# Patient Record
Sex: Female | Born: 1945
Health system: Southern US, Community
[De-identification: ages and names within clinical notes are randomized; demographics above are authoritative.]

## PROBLEM LIST (undated history)

## (undated) DIAGNOSIS — G8929 Other chronic pain: Secondary | ICD-10-CM

## (undated) DIAGNOSIS — F419 Anxiety disorder, unspecified: Secondary | ICD-10-CM

## (undated) DIAGNOSIS — R29898 Other symptoms and signs involving the musculoskeletal system: Secondary | ICD-10-CM

## (undated) DIAGNOSIS — M1990 Unspecified osteoarthritis, unspecified site: Secondary | ICD-10-CM

## (undated) DIAGNOSIS — K219 Gastro-esophageal reflux disease without esophagitis: Secondary | ICD-10-CM

## (undated) DIAGNOSIS — E0590 Thyrotoxicosis, unspecified without thyrotoxic crisis or storm: Secondary | ICD-10-CM

## (undated) DIAGNOSIS — J449 Chronic obstructive pulmonary disease, unspecified: Secondary | ICD-10-CM

## (undated) DIAGNOSIS — M2550 Pain in unspecified joint: Secondary | ICD-10-CM

## (undated) DIAGNOSIS — R112 Nausea with vomiting, unspecified: Secondary | ICD-10-CM

## (undated) DIAGNOSIS — E785 Hyperlipidemia, unspecified: Secondary | ICD-10-CM

## (undated) DIAGNOSIS — H269 Unspecified cataract: Secondary | ICD-10-CM

## (undated) DIAGNOSIS — Z860100 Personal history of colon polyps, unspecified: Secondary | ICD-10-CM

## (undated) DIAGNOSIS — Z9889 Other specified postprocedural states: Secondary | ICD-10-CM

## (undated) DIAGNOSIS — L570 Actinic keratosis: Secondary | ICD-10-CM

## (undated) DIAGNOSIS — M2540 Effusion, unspecified joint: Secondary | ICD-10-CM

## (undated) DIAGNOSIS — G4700 Insomnia, unspecified: Secondary | ICD-10-CM

## (undated) DIAGNOSIS — I10 Essential (primary) hypertension: Secondary | ICD-10-CM

## (undated) DIAGNOSIS — J189 Pneumonia, unspecified organism: Secondary | ICD-10-CM

## (undated) DIAGNOSIS — M549 Dorsalgia, unspecified: Secondary | ICD-10-CM

## (undated) HISTORY — PX: TUBAL LIGATION: SHX77

## (undated) HISTORY — DX: Anxiety disorder, unspecified: F41.9

## (undated) HISTORY — PX: COLONOSCOPY: SHX174

## (undated) HISTORY — PX: ABDOMINAL HYSTERECTOMY: SHX81

## (undated) HISTORY — PX: BACK SURGERY: SHX140

## (undated) HISTORY — PX: THORACIC DISC SURGERY: SHX801

## (undated) HISTORY — PX: EYE SURGERY: SHX253

## (undated) HISTORY — PX: TONSILLECTOMY: SUR1361

## (undated) HISTORY — DX: Actinic keratosis: L57.0

## (undated) HISTORY — DX: Other symptoms and signs involving the musculoskeletal system: R29.898

## (undated) HISTORY — PX: VAGINA SURGERY: SHX829

## (undated) HISTORY — PX: JOINT REPLACEMENT: SHX530

---

## 2004-06-02 ENCOUNTER — Ambulatory Visit: Payer: Self-pay | Admitting: Family Medicine

## 2008-02-02 ENCOUNTER — Ambulatory Visit: Payer: Self-pay | Admitting: Family Medicine

## 2008-09-17 LAB — HM COLONOSCOPY: HM Colonoscopy: NORMAL

## 2009-04-06 ENCOUNTER — Emergency Department: Payer: Self-pay | Admitting: Emergency Medicine

## 2009-05-08 ENCOUNTER — Ambulatory Visit: Payer: Self-pay | Admitting: Unknown Physician Specialty

## 2009-05-16 ENCOUNTER — Ambulatory Visit: Payer: Self-pay | Admitting: Unknown Physician Specialty

## 2009-05-23 ENCOUNTER — Ambulatory Visit: Payer: Self-pay | Admitting: Gastroenterology

## 2009-07-06 HISTORY — PX: FOOT SURGERY: SHX648

## 2009-07-06 HISTORY — PX: MELANOMA EXCISION: SHX5266

## 2009-07-06 HISTORY — PX: ESOPHAGOGASTRODUODENOSCOPY: SHX1529

## 2010-04-28 ENCOUNTER — Ambulatory Visit: Payer: Self-pay | Admitting: Family Medicine

## 2010-05-19 ENCOUNTER — Ambulatory Visit: Payer: Self-pay | Admitting: Gastroenterology

## 2010-05-21 LAB — PATHOLOGY REPORT

## 2011-04-06 LAB — HM DIABETES EYE EXAM

## 2011-05-21 ENCOUNTER — Ambulatory Visit: Payer: Self-pay | Admitting: Family Medicine

## 2011-08-20 ENCOUNTER — Ambulatory Visit: Payer: Self-pay | Admitting: Orthopedic Surgery

## 2011-09-08 ENCOUNTER — Encounter (HOSPITAL_COMMUNITY)
Admission: RE | Admit: 2011-09-08 | Discharge: 2011-09-08 | Disposition: A | Discharge status: Home / Self Care | Payer: PRIVATE HEALTH INSURANCE | Source: Ambulatory Visit | Attending: Neurosurgery | Admitting: Neurosurgery

## 2011-09-08 ENCOUNTER — Encounter (HOSPITAL_COMMUNITY): Payer: Self-pay | Admitting: Pharmacy Technician

## 2011-09-08 ENCOUNTER — Other Ambulatory Visit: Payer: Self-pay | Admitting: Neurosurgery

## 2011-09-08 ENCOUNTER — Other Ambulatory Visit: Payer: Self-pay

## 2011-09-08 ENCOUNTER — Encounter (HOSPITAL_COMMUNITY): Payer: Self-pay

## 2011-09-08 ENCOUNTER — Ambulatory Visit (HOSPITAL_COMMUNITY)
Admission: RE | Admit: 2011-09-08 | Discharge: 2011-09-08 | Disposition: A | Discharge status: Home / Self Care | Payer: PRIVATE HEALTH INSURANCE | Source: Ambulatory Visit | Attending: Anesthesiology | Admitting: Anesthesiology

## 2011-09-08 DIAGNOSIS — R0602 Shortness of breath: Secondary | ICD-10-CM | POA: Insufficient documentation

## 2011-09-08 DIAGNOSIS — Z01812 Encounter for preprocedural laboratory examination: Secondary | ICD-10-CM | POA: Insufficient documentation

## 2011-09-08 DIAGNOSIS — Z01818 Encounter for other preprocedural examination: Secondary | ICD-10-CM | POA: Insufficient documentation

## 2011-09-08 DIAGNOSIS — Z01810 Encounter for preprocedural cardiovascular examination: Secondary | ICD-10-CM | POA: Insufficient documentation

## 2011-09-08 HISTORY — DX: Other chronic pain: G89.29

## 2011-09-08 HISTORY — DX: Personal history of colon polyps, unspecified: Z86.0100

## 2011-09-08 HISTORY — DX: Essential (primary) hypertension: I10

## 2011-09-08 HISTORY — DX: Unspecified cataract: H26.9

## 2011-09-08 HISTORY — DX: Personal history of colonic polyps: Z86.010

## 2011-09-08 HISTORY — DX: Hyperlipidemia, unspecified: E78.5

## 2011-09-08 HISTORY — DX: Unspecified osteoarthritis, unspecified site: M19.90

## 2011-09-08 HISTORY — DX: Pneumonia, unspecified organism: J18.9

## 2011-09-08 HISTORY — DX: Pain in unspecified joint: M25.50

## 2011-09-08 HISTORY — DX: Other chronic pain: M54.9

## 2011-09-08 HISTORY — DX: Nausea with vomiting, unspecified: R11.2

## 2011-09-08 HISTORY — DX: Other specified postprocedural states: Z98.890

## 2011-09-08 HISTORY — DX: Effusion, unspecified joint: M25.40

## 2011-09-08 HISTORY — DX: Gastro-esophageal reflux disease without esophagitis: K21.9

## 2011-09-08 LAB — BASIC METABOLIC PANEL
BUN: 9 mg/dL (ref 6–23)
CO2: 34 mEq/L — ABNORMAL HIGH (ref 19–32)
Calcium: 10 mg/dL (ref 8.4–10.5)
Chloride: 96 mEq/L (ref 96–112)
Creatinine, Ser: 0.76 mg/dL (ref 0.50–1.10)
GFR calc Af Amer: >90 mL/min (ref >90)
GFR calc non Af Amer: 88 mL/min — ABNORMAL LOW (ref >90)
Glucose, Bld: 147 mg/dL — ABNORMAL HIGH (ref 70–99)
Potassium: 3 mEq/L — ABNORMAL LOW (ref 3.5–5.1)
Sodium: 139 mEq/L (ref 135–145)

## 2011-09-08 LAB — CBC
HCT: 39.8 % (ref 36.0–46.0)
Hemoglobin: 14.1 g/dL (ref 12.0–15.0)
MCH: 31.3 pg (ref 26.0–34.0)
MCHC: 35.4 g/dL (ref 30.0–36.0)
MCV: 88.2 fL (ref 78.0–100.0)
Platelets: 301 10*3/uL (ref 150–400)
RBC: 4.51 MIL/uL (ref 3.87–5.11)
RDW: 12.6 % (ref 11.5–15.5)
WBC: 11 10*3/uL — ABNORMAL HIGH (ref 4.0–10.5)

## 2011-09-08 LAB — SURGICAL PCR SCREEN
MRSA, PCR: NEGATIVE
Staphylococcus aureus: POSITIVE — AB

## 2011-09-08 NOTE — Pre-Procedure Instructions (Signed)
22 Maria Hinton  09/08/2011   Your procedure is scheduled on:  Thurs, Mar 7 @ 1210 pm  Report to [facility] at 0900 AM.  Call this number if you have problems the morning of surgery: [phone]   Remember:   Do not eat food:After Midnight.  May have clear liquids: up to 4 Hours before arrival.(until 5:00 am)  Clear liquids include soda, tea, black coffee, apple or grape juice, broth.  Take these medicines the morning of surgery with A SIP OF WATER:    Do not wear jewelry, make-up or nail polish.  Do not wear lotions, powders, or perfumes. You may wear deodorant.  Do not shave 48 hours prior to surgery.  Do not bring valuables to the hospital.  Contacts, dentures or bridgework may not be worn into surgery.  Leave suitcase in the car. After surgery it may be brought to your room.  For patients admitted to the hospital, checkout time is 11:00 AM the day of discharge.   Patients discharged the day of surgery will not be allowed to drive home.  Name and phone number of your driver:   Special Instructions: CHG Shower Use Special Wash: 1/2 bottle night before surgery and 1/2 bottle morning of surgery.   Please read over the following fact sheets that you were given: Pain Booklet, Coughing and Deep Breathing, MRSA Information and Surgical Site Infection Prevention

## 2011-09-08 NOTE — Progress Notes (Signed)
Pt doesn't have a cardiologist;medical MD is Dr.Katherine Bliss who manages HTN/[phone])  Heart cath in 1965 and then last one 24yrs ago  Stress test done 15+yrs ago and the last one about 41yrs ago  Echo done about 46yrs ago

## 2011-09-09 MED ORDER — CEFAZOLIN SODIUM 1-5 GM-% IV SOLN
1.0000 g | INTRAVENOUS | Status: AC
  Administered 2011-09-10: 1 g via INTRAVENOUS
  Filled 2011-09-09: qty 50

## 2011-09-10 ENCOUNTER — Encounter (HOSPITAL_COMMUNITY)
Admission: RE | Disposition: A | Discharge status: Home / Self Care | Payer: Self-pay | Source: Ambulatory Visit | Attending: Neurosurgery

## 2011-09-10 ENCOUNTER — Encounter (HOSPITAL_COMMUNITY): Payer: Self-pay | Admitting: Surgery

## 2011-09-10 ENCOUNTER — Ambulatory Visit (HOSPITAL_COMMUNITY)
Admission: RE | Admit: 2011-09-10 | Discharge: 2011-09-11 | Disposition: A | Discharge status: Home / Self Care | Payer: PRIVATE HEALTH INSURANCE | Source: Ambulatory Visit | Attending: Neurosurgery | Admitting: Neurosurgery

## 2011-09-10 ENCOUNTER — Encounter (HOSPITAL_COMMUNITY): Payer: Self-pay | Admitting: Vascular Surgery

## 2011-09-10 ENCOUNTER — Ambulatory Visit (HOSPITAL_COMMUNITY): Payer: PRIVATE HEALTH INSURANCE

## 2011-09-10 ENCOUNTER — Ambulatory Visit (HOSPITAL_COMMUNITY): Payer: PRIVATE HEALTH INSURANCE | Admitting: Vascular Surgery

## 2011-09-10 DIAGNOSIS — E785 Hyperlipidemia, unspecified: Secondary | ICD-10-CM | POA: Insufficient documentation

## 2011-09-10 DIAGNOSIS — G8929 Other chronic pain: Secondary | ICD-10-CM | POA: Insufficient documentation

## 2011-09-10 DIAGNOSIS — I10 Essential (primary) hypertension: Secondary | ICD-10-CM | POA: Insufficient documentation

## 2011-09-10 DIAGNOSIS — Z23 Encounter for immunization: Secondary | ICD-10-CM | POA: Insufficient documentation

## 2011-09-10 DIAGNOSIS — M47817 Spondylosis without myelopathy or radiculopathy, lumbosacral region: Secondary | ICD-10-CM | POA: Insufficient documentation

## 2011-09-10 DIAGNOSIS — M5126 Other intervertebral disc displacement, lumbar region: Secondary | ICD-10-CM | POA: Insufficient documentation

## 2011-09-10 DIAGNOSIS — E119 Type 2 diabetes mellitus without complications: Secondary | ICD-10-CM | POA: Insufficient documentation

## 2011-09-10 HISTORY — PX: LUMBAR LAMINECTOMY/DECOMPRESSION MICRODISCECTOMY: SHX5026

## 2011-09-10 LAB — GLUCOSE, CAPILLARY
Glucose-Capillary: 169 mg/dL — ABNORMAL HIGH (ref 70–99)
Glucose-Capillary: 112 mg/dL — ABNORMAL HIGH (ref 70–99)
Glucose-Capillary: 93 mg/dL (ref 70–99)

## 2011-09-10 SURGERY — LUMBAR LAMINECTOMY/DECOMPRESSION MICRODISCECTOMY 1 LEVEL
Anesthesia: General | Laterality: Right | Wound class: Clean

## 2011-09-10 MED ORDER — GLYBURIDE 5 MG PO TABS
10.0000 mg | ORAL_TABLET | Freq: Two times a day (BID) | ORAL | Status: DC
  Administered 2011-09-11: 10 mg via ORAL
  Filled 2011-09-10 (×4): qty 2

## 2011-09-10 MED ORDER — HYDROCHLOROTHIAZIDE 25 MG PO TABS
25.0000 mg | ORAL_TABLET | Freq: Two times a day (BID) | ORAL | Status: DC
  Administered 2011-09-11: 25 mg via ORAL
  Filled 2011-09-10 (×4): qty 1

## 2011-09-10 MED ORDER — KETOROLAC TROMETHAMINE 30 MG/ML IJ SOLN: 30.0000 mg | Freq: Once | INTRAMUSCULAR | Status: DC

## 2011-09-10 MED ORDER — ALUM & MAG HYDROXIDE-SIMETH 200-200-20 MG/5ML PO SUSP: 30.0000 mL | Freq: Four times a day (QID) | ORAL | Status: DC | PRN

## 2011-09-10 MED ORDER — MENTHOL 3 MG MT LOZG: 1.0000 | LOZENGE | OROMUCOSAL | Status: DC | PRN

## 2011-09-10 MED ORDER — THROMBIN 5000 UNITS EX SOLR
CUTANEOUS | Status: DC | PRN
  Administered 2011-09-10: 5000 [IU] via TOPICAL

## 2011-09-10 MED ORDER — ZOLPIDEM TARTRATE 5 MG PO TABS: 10.0000 mg | ORAL_TABLET | Freq: Every evening | ORAL | Status: DC | PRN

## 2011-09-10 MED ORDER — SORBITOL 70 % SOLN
30.0000 mL | Freq: Every day | Status: DC | PRN
  Filled 2011-09-10: qty 30

## 2011-09-10 MED ORDER — METHYLPREDNISOLONE ACETATE PF 80 MG/ML IJ SUSP
INTRAMUSCULAR | Status: DC | PRN
  Administered 2011-09-10: 80 mg

## 2011-09-10 MED ORDER — SODIUM CHLORIDE 0.9 % IV SOLN
INTRAVENOUS | Status: AC
  Filled 2011-09-10: qty 500

## 2011-09-10 MED ORDER — ACETAMINOPHEN 325 MG PO TABS: 650.0000 mg | ORAL_TABLET | ORAL | Status: DC | PRN

## 2011-09-10 MED ORDER — ONDANSETRON HCL 4 MG/2ML IJ SOLN: 4.0000 mg | Freq: Once | INTRAMUSCULAR | Status: DC | PRN

## 2011-09-10 MED ORDER — ACETAMINOPHEN 650 MG RE SUPP: 650.0000 mg | RECTAL | Status: DC | PRN

## 2011-09-10 MED ORDER — INSULIN ASPART 100 UNIT/ML ~~LOC~~ SOLN
0.0000 [IU] | Freq: Three times a day (TID) | SUBCUTANEOUS | Status: DC
  Administered 2011-09-11: 5 [IU] via SUBCUTANEOUS

## 2011-09-10 MED ORDER — MORPHINE SULFATE 4 MG/ML IJ SOLN: 4.0000 mg | INTRAMUSCULAR | Status: DC | PRN

## 2011-09-10 MED ORDER — METOPROLOL-HYDROCHLOROTHIAZIDE 50-25 MG PO TABS: 1.0000 | ORAL_TABLET | Freq: Two times a day (BID) | ORAL | Status: DC

## 2011-09-10 MED ORDER — HYDROMORPHONE HCL PF 1 MG/ML IJ SOLN: 0.2500 mg | INTRAMUSCULAR | Status: DC | PRN

## 2011-09-10 MED ORDER — LIDOCAINE-EPINEPHRINE 1 %-1:100000 IJ SOLN
INTRAMUSCULAR | Status: DC | PRN
  Administered 2011-09-10: 20 mL

## 2011-09-10 MED ORDER — BUPIVACAINE HCL (PF) 0.5 % IJ SOLN
INTRAMUSCULAR | Status: DC | PRN
  Administered 2011-09-10: 20 mL

## 2011-09-10 MED ORDER — GLYCOPYRROLATE 0.2 MG/ML IJ SOLN
INTRAMUSCULAR | Status: DC | PRN
  Administered 2011-09-10: .8 mg via INTRAVENOUS

## 2011-09-10 MED ORDER — PROPOFOL 10 MG/ML IV EMUL
INTRAVENOUS | Status: DC | PRN
  Administered 2011-09-10: 180 mg via INTRAVENOUS

## 2011-09-10 MED ORDER — HEMOSTATIC AGENTS (NO CHARGE) OPTIME
TOPICAL | Status: DC | PRN
  Administered 2011-09-10: 1 via TOPICAL

## 2011-09-10 MED ORDER — FENTANYL CITRATE 0.05 MG/ML IJ SOLN
INTRAMUSCULAR | Status: DC | PRN
  Administered 2011-09-10: 50 ug via INTRAVENOUS
  Administered 2011-09-10: 150 ug via INTRAVENOUS

## 2011-09-10 MED ORDER — 0.9 % SODIUM CHLORIDE (POUR BTL) OPTIME
TOPICAL | Status: DC | PRN
  Administered 2011-09-10: 1000 mL

## 2011-09-10 MED ORDER — GLYBURIDE-METFORMIN 5-500 MG PO TABS: 2.0000 | ORAL_TABLET | Freq: Two times a day (BID) | ORAL | Status: DC

## 2011-09-10 MED ORDER — FENTANYL CITRATE 0.05 MG/ML IJ SOLN
INTRAMUSCULAR | Status: DC | PRN
  Administered 2011-09-10: 25 ug via INTRAVENOUS

## 2011-09-10 MED ORDER — PNEUMOCOCCAL VAC POLYVALENT 25 MCG/0.5ML IJ INJ
0.5000 mL | INJECTION | INTRAMUSCULAR | Status: AC
  Administered 2011-09-11: 0.5 mL via INTRAMUSCULAR
  Filled 2011-09-10: qty 0.5

## 2011-09-10 MED ORDER — DEXAMETHASONE SODIUM PHOSPHATE 4 MG/ML IJ SOLN
INTRAMUSCULAR | Status: DC | PRN
  Administered 2011-09-10: 4 mg via INTRAVENOUS

## 2011-09-10 MED ORDER — FENTANYL CITRATE 0.05 MG/ML IJ SOLN
INTRAMUSCULAR | Status: AC
  Filled 2011-09-10: qty 2

## 2011-09-10 MED ORDER — METOPROLOL TARTRATE 50 MG PO TABS
50.0000 mg | ORAL_TABLET | Freq: Two times a day (BID) | ORAL | Status: DC
  Administered 2011-09-11: 50 mg via ORAL
  Filled 2011-09-10 (×4): qty 1

## 2011-09-10 MED ORDER — DIAZEPAM 5 MG PO TABS: 5.0000 mg | ORAL_TABLET | Freq: Three times a day (TID) | ORAL | Status: DC | PRN

## 2011-09-10 MED ORDER — CYCLOBENZAPRINE HCL 10 MG PO TABS
10.0000 mg | ORAL_TABLET | Freq: Three times a day (TID) | ORAL | Status: DC | PRN
  Administered 2011-09-11: 10 mg via ORAL
  Filled 2011-09-10: qty 1

## 2011-09-10 MED ORDER — PANTOPRAZOLE SODIUM 40 MG PO TBEC: 40.0000 mg | DELAYED_RELEASE_TABLET | Freq: Every day | ORAL | Status: DC

## 2011-09-10 MED ORDER — PHENOL 1.4 % MT LIQD: 1.0000 | OROMUCOSAL | Status: DC | PRN

## 2011-09-10 MED ORDER — SIMVASTATIN 40 MG PO TABS
40.0000 mg | ORAL_TABLET | Freq: Every day | ORAL | Status: DC
  Filled 2011-09-10 (×2): qty 1

## 2011-09-10 MED ORDER — ONDANSETRON HCL 4 MG/2ML IJ SOLN
INTRAMUSCULAR | Status: DC | PRN
  Administered 2011-09-10: 4 mg via INTRAVENOUS

## 2011-09-10 MED ORDER — NEOSTIGMINE METHYLSULFATE 1 MG/ML IJ SOLN
INTRAMUSCULAR | Status: DC | PRN
  Administered 2011-09-10: 5 mg via INTRAVENOUS

## 2011-09-10 MED ORDER — HYDROCODONE-ACETAMINOPHEN 5-325 MG PO TABS: 1.0000 | ORAL_TABLET | ORAL | Status: DC | PRN

## 2011-09-10 MED ORDER — KETOROLAC TROMETHAMINE 30 MG/ML IJ SOLN
30.0000 mg | Freq: Four times a day (QID) | INTRAMUSCULAR | Status: DC
  Administered 2011-09-10 – 2011-09-11 (×3): 30 mg via INTRAVENOUS
  Filled 2011-09-10 (×7): qty 1

## 2011-09-10 MED ORDER — SODIUM CHLORIDE 0.9 % IJ SOLN: 3.0000 mL | Freq: Two times a day (BID) | INTRAMUSCULAR | Status: DC

## 2011-09-10 MED ORDER — POTASSIUM CHLORIDE ER 10 MEQ PO TBCR
10.0000 meq | EXTENDED_RELEASE_TABLET | Freq: Every day | ORAL | Status: DC
  Administered 2011-09-11: 10 meq via ORAL
  Filled 2011-09-10 (×2): qty 1

## 2011-09-10 MED ORDER — HYDROXYZINE HCL 25 MG PO TABS
50.0000 mg | ORAL_TABLET | ORAL | Status: DC | PRN
Start: 2011-09-10 — End: 2011-09-11

## 2011-09-10 MED ORDER — HYDROXYZINE HCL 50 MG/ML IM SOLN: 50.0000 mg | INTRAMUSCULAR | Status: DC | PRN

## 2011-09-10 MED ORDER — SODIUM CHLORIDE 0.9 % IR SOLN
Status: DC | PRN
  Administered 2011-09-10: 13:00:00

## 2011-09-10 MED ORDER — BACITRACIN 50000 UNITS IM SOLR
INTRAMUSCULAR | Status: AC
  Filled 2011-09-10: qty 1

## 2011-09-10 MED ORDER — METFORMIN HCL 500 MG PO TABS
1000.0000 mg | ORAL_TABLET | Freq: Two times a day (BID) | ORAL | Status: DC
  Administered 2011-09-11: 1000 mg via ORAL
  Filled 2011-09-10 (×4): qty 2

## 2011-09-10 MED ORDER — SODIUM CHLORIDE 0.9 % IJ SOLN: 3.0000 mL | INTRAMUSCULAR | Status: DC | PRN

## 2011-09-10 MED ORDER — ROCURONIUM BROMIDE 100 MG/10ML IV SOLN
INTRAVENOUS | Status: DC | PRN
  Administered 2011-09-10: 50 mg via INTRAVENOUS

## 2011-09-10 MED ORDER — POTASSIUM CHLORIDE IN NACL 40-0.9 MEQ/L-% IV SOLN
INTRAVENOUS | Status: DC
  Filled 2011-09-10 (×4): qty 1000

## 2011-09-10 MED ORDER — MAGNESIUM HYDROXIDE 400 MG/5ML PO SUSP: 30.0000 mL | Freq: Every day | ORAL | Status: DC | PRN

## 2011-09-10 MED ORDER — INFLUENZA VIRUS VACC SPLIT PF IM SUSP
0.5000 mL | INTRAMUSCULAR | Status: AC
  Administered 2011-09-11: 0.5 mL via INTRAMUSCULAR
  Filled 2011-09-10: qty 0.5

## 2011-09-10 MED ORDER — MIDAZOLAM HCL 5 MG/5ML IJ SOLN
INTRAMUSCULAR | Status: DC | PRN
  Administered 2011-09-10: 2 mg via INTRAVENOUS

## 2011-09-10 MED ORDER — INSULIN ASPART 100 UNIT/ML ~~LOC~~ SOLN
0.0000 [IU] | Freq: Every day | SUBCUTANEOUS | Status: DC
  Administered 2011-09-10: 4 [IU] via SUBCUTANEOUS
  Filled 2011-09-10: qty 3

## 2011-09-10 MED ORDER — LACTATED RINGERS IV SOLN
INTRAVENOUS | Status: DC | PRN
  Administered 2011-09-10 (×2): via INTRAVENOUS

## 2011-09-10 MED ORDER — OXYCODONE-ACETAMINOPHEN 5-325 MG PO TABS: 1.0000 | ORAL_TABLET | ORAL | Status: DC | PRN

## 2011-09-10 SURGICAL SUPPLY — 58 items
BAG DECANTER FOR FLEXI CONT (MISCELLANEOUS) ×2 IMPLANT
BENZOIN TINCTURE PRP APPL 2/3 (GAUZE/BANDAGES/DRESSINGS) IMPLANT
BLADE SURG ROTATE 9660 (MISCELLANEOUS) IMPLANT
BRUSH SCRUB EZ PLAIN DRY (MISCELLANEOUS) ×2 IMPLANT
BUR ACORN 6.0 ACORN (BURR) ×2 IMPLANT
BUR ACRON 5.0MM COATED (BURR) IMPLANT
BUR MATCHSTICK NEURO 3.0 LAGG (BURR) ×2 IMPLANT
CANISTER SUCTION 2500CC (MISCELLANEOUS) ×2 IMPLANT
CLOTH BEACON ORANGE TIMEOUT ST (SAFETY) ×2 IMPLANT
CONT SPEC 4OZ CLIKSEAL STRL BL (MISCELLANEOUS) ×2 IMPLANT
DERMABOND ADHESIVE PROPEN (GAUZE/BANDAGES/DRESSINGS) ×1
DERMABOND ADVANCED (GAUZE/BANDAGES/DRESSINGS)
DERMABOND ADVANCED .7 DNX12 (GAUZE/BANDAGES/DRESSINGS) IMPLANT
DERMABOND ADVANCED .7 DNX6 (GAUZE/BANDAGES/DRESSINGS) ×1 IMPLANT
DRAPE LAPAROTOMY 100X72X124 (DRAPES) ×2 IMPLANT
DRAPE MICROSCOPE LEICA (MISCELLANEOUS) ×2 IMPLANT
DRAPE POUCH INSTRU U-SHP 10X18 (DRAPES) ×2 IMPLANT
DRSG EMULSION OIL 3X3 NADH (GAUZE/BANDAGES/DRESSINGS) IMPLANT
ELECT REM PT RETURN 9FT ADLT (ELECTROSURGICAL) ×2
ELECTRODE REM PT RTRN 9FT ADLT (ELECTROSURGICAL) ×1 IMPLANT
GAUZE SPONGE 4X4 16PLY XRAY LF (GAUZE/BANDAGES/DRESSINGS) IMPLANT
GLOVE BIOGEL PI IND STRL 7.5 (GLOVE) ×1 IMPLANT
GLOVE BIOGEL PI IND STRL 8 (GLOVE) ×2 IMPLANT
GLOVE BIOGEL PI INDICATOR 7.5 (GLOVE) ×1
GLOVE BIOGEL PI INDICATOR 8 (GLOVE) ×2
GLOVE ECLIPSE 7.0 STRL STRAW (GLOVE) ×2 IMPLANT
GLOVE ECLIPSE 7.5 STRL STRAW (GLOVE) ×6 IMPLANT
GLOVE EXAM NITRILE LRG STRL (GLOVE) IMPLANT
GLOVE EXAM NITRILE MD LF STRL (GLOVE) ×2 IMPLANT
GLOVE EXAM NITRILE XL STR (GLOVE) IMPLANT
GLOVE EXAM NITRILE XS STR PU (GLOVE) IMPLANT
GOWN BRE IMP SLV AUR LG STRL (GOWN DISPOSABLE) ×2 IMPLANT
GOWN BRE IMP SLV AUR XL STRL (GOWN DISPOSABLE) ×4 IMPLANT
GOWN STRL REIN 2XL LVL4 (GOWN DISPOSABLE) IMPLANT
KIT BASIN OR (CUSTOM PROCEDURE TRAY) ×2 IMPLANT
KIT ROOM TURNOVER OR (KITS) ×2 IMPLANT
NEEDLE HYPO 18GX1.5 BLUNT FILL (NEEDLE) ×2 IMPLANT
NEEDLE SPNL 18GX3.5 QUINCKE PK (NEEDLE) ×2 IMPLANT
NEEDLE SPNL 22GX3.5 QUINCKE BK (NEEDLE) ×2 IMPLANT
NS IRRIG 1000ML POUR BTL (IV SOLUTION) ×2 IMPLANT
PACK LAMINECTOMY NEURO (CUSTOM PROCEDURE TRAY) ×2 IMPLANT
PAD ARMBOARD 7.5X6 YLW CONV (MISCELLANEOUS) ×6 IMPLANT
PATTIES SURGICAL .5 X1 (DISPOSABLE) IMPLANT
RUBBERBAND STERILE (MISCELLANEOUS) ×4 IMPLANT
SPONGE GAUZE 4X4 12PLY (GAUZE/BANDAGES/DRESSINGS) IMPLANT
SPONGE LAP 4X18 X RAY DECT (DISPOSABLE) IMPLANT
SPONGE SURGIFOAM ABS GEL SZ50 (HEMOSTASIS) ×2 IMPLANT
STRIP CLOSURE SKIN 1/2X4 (GAUZE/BANDAGES/DRESSINGS) IMPLANT
SUT PROLENE 6 0 BV (SUTURE) IMPLANT
SUT VIC AB 1 CT1 18XBRD ANBCTR (SUTURE) ×1 IMPLANT
SUT VIC AB 1 CT1 8-18 (SUTURE) ×1
SUT VIC AB 2-0 CP2 18 (SUTURE) ×2 IMPLANT
SUT VIC AB 3-0 SH 8-18 (SUTURE) ×2 IMPLANT
SYR 20CC LL (SYRINGE) ×2 IMPLANT
SYR 5ML LL (SYRINGE) ×2 IMPLANT
TOWEL OR 17X24 6PK STRL BLUE (TOWEL DISPOSABLE) ×2 IMPLANT
TOWEL OR 17X26 10 PK STRL BLUE (TOWEL DISPOSABLE) ×2 IMPLANT
WATER STERILE IRR 1000ML POUR (IV SOLUTION) ×2 IMPLANT

## 2011-09-10 NOTE — Progress Notes (Signed)
There were no records of an EKG or prior cardiac studies at [facility]

## 2011-09-10 NOTE — Progress Notes (Signed)
Filed Vitals:   09/10/11 1500 09/10/11 1502 09/10/11 1525 09/10/11 1600  BP: 143/77  158/76 129/59  Pulse: 72  61 67  Temp:  97.2 F (36.2 C) 97.9 F (36.6 C) 98 F (36.7 C)  TempSrc:      Resp: 23  18 16   SpO2: 100%  94% 96%    Patient sitting up in bed vigorously eating dinner. She has voided already. Weakness persists in the right dorsiflexor and EHL. Wound clean dry. Encouraged to ambulate.  Plan: Continue progress through postoperative recovery.  Hewitt Shorts, MD 09/10/2011, 5:40 PM

## 2011-09-10 NOTE — Transfer of Care (Signed)
Immediate Anesthesia Transfer of Care Note  Patient: Maria Hinton  Procedure(s) Performed: Procedure(s) (LRB): LUMBAR LAMINECTOMY/DECOMPRESSION MICRODISCECTOMY 1 LEVEL (Right)  Patient Location: PACU  Anesthesia Type: General  Level of Consciousness: awake  Airway & Oxygen Therapy: Patient Spontanous Breathing  Post-op Assessment: Report given to PACU RN  Post vital signs: stable  Complications: No apparent anesthesia complications

## 2011-09-10 NOTE — Progress Notes (Signed)
Noted that pt's EKG was abnormal.& K+3.0.  Jaynie Collins, PA, notified.//L. ,RN

## 2011-09-10 NOTE — Anesthesia Procedure Notes (Signed)
Procedure Name: Intubation Date/Time: 09/10/2011 12:55 PM Performed by: Ellin Goodie Pre-anesthesia Checklist: Patient identified, Emergency Drugs available, Suction available, Patient being monitored and Timeout performed Patient Re-evaluated:Patient Re-evaluated prior to inductionOxygen Delivery Method: Circle system utilized Preoxygenation: Pre-oxygenation with 100% oxygen Intubation Type: IV induction Ventilation: Mask ventilation without difficulty Laryngoscope Size: Mac and 3 Grade View: Grade I Tube type: Oral Number of attempts: 1 Airway Equipment and Method: Stylet Placement Confirmation: ETT inserted through vocal cords under direct vision,  positive ETCO2 and breath sounds checked- equal and bilateral Secured at: 22 cm Tube secured with: Tape Dental Injury: Teeth and Oropharynx as per pre-operative assessment

## 2011-09-10 NOTE — Consult Note (Signed)
Anesthesia:  Patient is a 66 year old female posted for a right L4-5 laminectomy/microdiskectomy today.  Her PAT appointment was on 09/08/11 with RN intentions for me to review, but her chart did not ever make it into my possession until just now.    Her history includes DM2, HTN, asthma, smoking, GERD, depression, HLD, chronic back pain, arthritis, catarats, PNA, and previous TAH, melanoma excision, and back surgeries.   Her PCP is Dr. Clayborn Bigness [phone]).  Her last cardiac testing was done > 15 years ago at [facility] for symptomatic palpitations (?SVT).  She reports work-up was negative, and her palpitations have been controlled on beta blocker therapy.  Labs noted.  K 3.0, which is acceptable per Anesthesia guidelines.  CXR shows no active disease.  EKG shows NSR, LAD, possible anterior infarct (age undetermined), probably non-specific anterior T wave changes.  Currently there are no comparison EKGs available at [facility] or at her PCP office.  I have requested an old EKG from [facility], but response is still pending.    She denies CP, SOB, or significant palpitations.  Up until  approximately 5 weeks ago she was walking up to 2 miles twice weekly.  She has since had to stop due to hip and RLE pain.  Exam shows heart with RRR, no murmur or carotid bruits noted.  Lungs clear.  Trace pre-tibial edema.    She does have multiple risk factors for CAD, but has been asymptomatic with reasonable recent exercise tolerance and no known history of CAD, so anticipate she can proceed as planned.  However, her Anesthesiologist will evaluate her in holding later today and determine a definitive anesthesia plan at that time.

## 2011-09-10 NOTE — H&P (Signed)
Subjective: Patient is a 66 y.o. female who is admitted for treatment of recurrent right L4-5 lumbar disc herniation, with resulting right lumbar radiculopathy. Patient's symptoms began 6 weeks ago. She will with disabling pain from the right side of her low back down through the right buttock, thigh, and leg and into the dorsum of the right foot. Patient developed profound weakness of the right foot. Patient was evaluated with MRI scan which revealed a large recurrent disc herniation the right-sided L4-5 with fragment migrated caudally behind the body of L5. She is admitted now for a right L4-5 lumbar laminotomy and microdiscectomy for recurrent disc herniation.    Past Medical History  Diagnosis Date  . PONV (postoperative nausea and vomiting)   . Hypertension     takes Metoprolol/HCTZ daily  . Hyperlipidemia     takes Pravastatin daily  . Asthma   . Pneumonia     x 2 ;in Dec 2010/2011  . Arthritis   . Joint swelling   . Joint pain     fingers  . Chronic back pain     DDD,herniated disc/spondylosis/radiculopathy  . GERD (gastroesophageal reflux disease)     takes Omeprazole daily  . Hemorrhoids   . History of colonic polyps   . Diabetes mellitus     takes Glucovance daily;Type 2 diabetic  . Early cataracts, bilateral   . Depression     after death of husband;takes Ativan prn    Past Surgical History  Procedure Date  . Thoracic disc surgery at age 80 and 4     mass of veins that had gotten into muscle and wrapped around rib;3 ribs also removed from left side  . Abdominal hysterectomy     at age 83  . Back surgery 20+yrs ago  . Foot surgery 2011    bunionectomy and knot removed from bottom of both feet  . Melanoma excision 2011    on nose/back on neck  . Vagina surgery     vaginal wall ruptured   . Tonsillectomy as a child    and adenoids   . Colonoscopy   . Esophagogastroduodenoscopy   . Tubal ligation     Prescriptions prior to admission  Medication Sig Dispense  Refill  . diazepam (VALIUM) 5 MG tablet Take 5 mg by mouth every 8 (eight) hours as needed. For anxiety      . glyBURIDE-metformin (GLUCOVANCE) 5-500 MG per tablet Take 2 tablets by mouth 2 (two) times daily with a meal.      . ibuprofen (ADVIL,MOTRIN) 800 MG tablet Take 800 mg by mouth every 8 (eight) hours as needed. For pain      . metoprolol-hydrochlorothiazide (LOPRESSOR HCT) 50-25 MG per tablet Take 1 tablet by mouth 2 (two) times daily.      . Multiple Vitamin (MULITIVITAMIN WITH MINERALS) TABS Take 1 tablet by mouth daily.      Marland Kitchen omeprazole (PRILOSEC) 20 MG capsule Take 20 mg by mouth daily.      . potassium chloride (K-DUR) 10 MEQ tablet Take 10 mEq by mouth daily.      . pravastatin (PRAVACHOL) 80 MG tablet Take 80 mg by mouth daily.       Allergies  Allergen Reactions  . Vicodin (Hydrocodone-Acetaminophen) Other (See Comments)    Insomnia     History  Substance Use Topics  . Smoking status: Current Everyday Smoker -- 0.5 packs/day for 3 years  . Smokeless tobacco: Not on file  . Alcohol Use: No  Family History  Problem Relation Age of Onset  . Anesthesia problems Neg Hx   . Hypotension Neg Hx   . Malignant hyperthermia Neg Hx   . Pseudochol deficiency Neg Hx      Review of Systems A comprehensive review of systems was negative.  Objective: Vital signs in last 24 hours: Temp:  [97.9 F (36.6 C)] 97.9 F (36.6 C) (03/07 0900) Pulse Rate:  [70] 70  (03/07 0900) Resp:  [18] 18  (03/07 0900) BP: (121)/(78) 121/78 mmHg (03/07 0900) SpO2:  [98 %] 98 % (03/07 0900)  EXAM: Patient is a well-developed well-nourished white female in no acute distress. Lungs are clear to auscultation , the patient has symmetrical respiratory excursion. Heart has a regular rate and rhythm normal S1 and S2 no murmur.   Abdomen is soft nontender nondistended bowel sounds are present. Extremity examination shows no clubbing cyanosis or edema. Musculoskeletal examination shows no tenderness to  palpation over the lumbar spinous processes or paralumbar musculature. She is able to flex to 90, but is limited in extension at about 5 due to pain. Neurologic examination shows 5 over 5 strength in the iliopsoas and quadriceps bilaterally. The left dorsiflexor, EHL, and plantar flexor are 5/5. Right dorsiflexor is 1-2/5. Right EHL is 0-1/5. Right plantar flexor is 5/5. Sensation is decreased to pinprick in the medial aspect of the right foot. Reflexes are 1 at the quadriceps bilaterally, absent the gastrocnemius I laterally and toes are downgoing bilaterally. Gait and stance of both favor the right lower extremity.   Data Review:CBC    Component Value Date/Time   WBC 11.0* 09/08/2011 1451   RBC 4.51 09/08/2011 1451   HGB 14.1 09/08/2011 1451   HCT 39.8 09/08/2011 1451   PLT 301 09/08/2011 1451   MCV 88.2 09/08/2011 1451   MCH 31.3 09/08/2011 1451   MCHC 35.4 09/08/2011 1451   RDW 12.6 09/08/2011 1451                          BMET    Component Value Date/Time   NA 139 09/08/2011 1451   K 3.0* 09/08/2011 1451   CL 96 09/08/2011 1451   CO2 34* 09/08/2011 1451   GLUCOSE 147* 09/08/2011 1451   BUN 9 09/08/2011 1451   CREATININE 0.76 09/08/2011 1451   CALCIUM 10.0 09/08/2011 1451   GFRNONAA 88* 09/08/2011 1451   GFRAA >90 09/08/2011 1451     Assessment/Plan: Patient with a severe right L5 lumbar radiculopathy secondary to a recurrent right L4-5 lumbar disc herniation, large. Admitted now for a right L4-5 lumbar laminotomy and micro-discectomy. I've discussed with the patient the nature of his condition, the nature the surgical procedure, the typical length of surgery, hospital stay, and overall recuperation. We discussed limitations postoperatively. I discussed risks of surgery including risks of infection, bleeding, possibly need for transfusion, the risk of nerve root dysfunction with pain, weakness, numbness, or paresthesias, or risk of dural tear and CSF leakage and possible need for further surgery, the risk of  recurrent disc herniation and the possible need for further surgery, and the risk of anesthetic complications including myocardial infarction, stroke, pneumonia, and death. Understanding all this the patient does wish to proceed with surgery and is admitted for such.    Hewitt Shorts, MD 09/10/2011 12:23 PM

## 2011-09-10 NOTE — Anesthesia Postprocedure Evaluation (Signed)
  Anesthesia Post-op Note  Patient: Maria Hinton  Procedure(s) Performed: Procedure(s) (LRB): LUMBAR LAMINECTOMY/DECOMPRESSION MICRODISCECTOMY 1 LEVEL (Right)  Patient Location: PACU  Anesthesia Type: General  Level of Consciousness: awake, alert  and oriented  Airway and Oxygen Therapy: Patient Spontanous Breathing and Patient connected to nasal cannula oxygen  Post-op Pain: mild  Post-op Assessment: Post-op Vital signs reviewed and Patient's Cardiovascular Status Stable  Post-op Vital Signs: stable  Complications: No apparent anesthesia complications

## 2011-09-10 NOTE — Op Note (Signed)
09/10/2011  2:34 PM  PATIENT:  Maria Hinton  66 y.o. female  PRE-OPERATIVE DIAGNOSIS:  Recurrent right L4-5 lumbar herniated disc lumbar degenerative disc disease lumbar spondylosis lumbar radiculopathy  POST-OPERATIVE DIAGNOSIS: Recurrent right L4-5 lumbar herniated disc lumbar degenerative disc disease lumbar spondylosis lumbar radiculopathy  PROCEDURE:  Procedure(s): LUMBAR LAMINECTOMY/DECOMPRESSION MICRODISCECTOMY 1 LEVEL: Right L4-5 lumbar laminotomy and microdiscectomy, with the operating microscope and microdissection and microsurgical technique  SURGEON:  Surgeon(s): Hewitt Shorts, MD  ASSISTANTS: Maeola Harman M.D.  ANESTHESIA:   general  EBL:  Total I/O In: 1000 [I.V.:1000] Out: -   BLOOD ADMINISTERED:none  COUNT: Correct per nursing staff  DICTATION: Patient was brought to the operating room and placed under general endotracheal anesthesia. Patient was turned to prone position the lumbar region was prepped with Betadine soap and solution and draped in a sterile fashion. The midline was infiltrated with local anesthetic with epinephrine. A localizing x-ray was taken and the L4-5 level was identified. Midline incision was made over the L4-5 level and was carried down through the subcutaneous tissue to the lumbar fascia. The lumbar fascia was incised on the right side and the paraspinal muscles were dissected from the spinous processes and lamina in a subperiosteal fashion. Another x-ray was taken and the L4-5 intralaminar space was identified. The operating microscope was draped and brought in the field to provide additional magnification, illumination, and visualization. Laminotomy was performed using the high-speed drill and Kerrison punches. The ligamentum flavum was carefully resected. There was extensive dorsal epidural scarring from the previous laminotomy, that was carefully dissected and were able to expose the neural structures. The underlying thecal sac and nerve  root were identified. We'll begin to mobilize the right L5 nerve root and thecal sac medially and immediately encountered herniated free fragmented disc caudal to the L4-5 disc space. Using a micro-hook multiple fragments of disc herniation were mobilized and removed. All loose fragments this was to were removed from the epidural space and good decompression of the thecal sac and nerve root was achieved. We then examined the annulus of the L4-5 disc. Scleral he degenerated, and mildly bulging. However it was not felt to be causing significant neural compression and it is felt that there be no benefit to incising the annulus and entering the disc space and continuing with an intra-discal discectomy. It is felt that the discectomy was completed and good decompression of the thecal sac and nerve had been achieved hemostasis was established with the use of bipolar cautery and Gelfoam with thrombin. The Gelfoam was removed and hemostasis confirmed. We then instilled 2 cc of fentanyl and 80 mg of Depo-Medrol into the epidural space. Deep fascia was closed with interrupted undyed 1 Vicryl sutures. Scarpa's fascia was closed with interrupted undyed 1 Vicryl sutures in the subcutaneous and subcuticular layer were closed with interrupted inverted 2-0 undyed Vicryl sutures. The skin edges were approximated with Dermabond. Following surgery the patient was turned back to a supine position to be reversed from the anesthetic extubated and transferred to the recovery room for further care.   PLAN OF CARE: Admit for overnight observation  PATIENT DISPOSITION:  PACU - hemodynamically stable.   Delay start of Pharmacological VTE agent (>24hrs) due to surgical blood loss or risk of bleeding:  yes

## 2011-09-10 NOTE — Anesthesia Preprocedure Evaluation (Addendum)
Anesthesia Evaluation  Patient identified by MRN, date of birth, ID band Patient awake    Reviewed: Allergy & Precautions, H&P , NPO status , Patient's Chart, lab work & pertinent test results  History of Anesthesia Complications (+) PONV  Airway Mallampati: II      Dental  (+) Teeth Intact   Pulmonary asthma , pneumonia ,  breath sounds clear to auscultation        Cardiovascular Rhythm:Regular Rate:Normal     Neuro/Psych Depression    GI/Hepatic   Endo/Other  Diabetes mellitus-, Well Controlled, Type 2, Oral Hypoglycemic Agents  Renal/GU      Musculoskeletal   Abdominal   Peds  Hematology   Anesthesia Other Findings   Reproductive/Obstetrics                         Anesthesia Physical Anesthesia Plan  ASA: III  Anesthesia Plan: General   Post-op Pain Management:    Induction: Intravenous  Airway Management Planned: Oral ETT  Additional Equipment:   Intra-op Plan:   Post-operative Plan: Extubation in OR  Informed Consent:   Dental advisory given  Plan Discussed with: CRNA and Surgeon  Anesthesia Plan Comments: (HNP L4-5 Type 2 glucose 169 Htn GERD  H/O post-op nausea and vomiting)       Anesthesia Quick Evaluation

## 2011-09-11 ENCOUNTER — Encounter (HOSPITAL_COMMUNITY): Payer: Self-pay | Admitting: Neurosurgery

## 2011-09-11 LAB — GLUCOSE, CAPILLARY
Glucose-Capillary: 182 mg/dL — ABNORMAL HIGH (ref 70–99)
Glucose-Capillary: 313 mg/dL — ABNORMAL HIGH (ref 70–99)
Glucose-Capillary: 207 mg/dL — ABNORMAL HIGH (ref 70–99)

## 2011-09-11 MED ORDER — OXYCODONE-ACETAMINOPHEN 5-325 MG PO TABS: 1.0000 | ORAL_TABLET | ORAL | Status: AC | PRN

## 2011-09-11 MED ORDER — HYDROCODONE-ACETAMINOPHEN 5-325 MG PO TABS: 1.0000 | ORAL_TABLET | ORAL | Status: DC | PRN

## 2011-09-11 MED ORDER — CYCLOBENZAPRINE HCL 10 MG PO TABS: 5.0000 mg | ORAL_TABLET | Freq: Three times a day (TID) | ORAL | Status: AC | PRN

## 2011-09-11 NOTE — Discharge Instructions (Signed)
Wound Care °Leave incision open to air. °You may shower. °Do not scrub directly on incision.  °Do not put any creams, lotions, or ointments on incision. °Activity °Walk each and every day, increasing distance each day. °No lifting greater than 5 lbs.  Avoid bending, arching, and twisting. °No driving for 2 weeks; may ride as a passenger locally. ° °Diet °Resume your normal diet.  °Return to Work °Will be discussed at you follow up appointment. °Call Your Doctor If Any of These Occur °Redness, drainage, or swelling at the wound.  °Temperature greater than 101 degrees. °Severe pain not relieved by pain medication. °Incision starts to come apart. °Follow Up Appt °Call today for appointment in 3 weeks ([phone]) or for problems.  If you have any hardware placed in your spine, you will need an x-ray before your appointment. °

## 2011-09-11 NOTE — Discharge Summary (Signed)
Physician Discharge Summary  Patient ID: Maria Hinton MRN: [id number] DOB/AGE: 07/14/1945 66 y.o.  Admit date: 09/10/2011 Discharge date: 09/11/2011  Admission Diagnoses: Recurrent right L4-5 lumbar herniated disc with resultant radiculopathy and foot drop.  Discharge Diagnoses: Recurrent right L4-5 lumbar herniated disc with resultant radiculopathy and foot drop.  Discharged Condition: good  Hospital Course: Patient was admitted underwent a right L4-5 lumbar laminotomy and microdiscectomy. Postoperative she has done quite well with excellent relief of her radicular pain has also noticed lessening of her numbness but she still has weakness in the dorsiflexor and the extensor hallicus longus. Wound is clean and dry. She's been up and ambulating in the halls. She is being discharged with instructions regarding wound care and activities, she is to followup with me in 3 weeks.  Discharge Exam: Blood pressure 154/77, pulse 73, temperature 98.1 F (36.7 C), temperature source Oral, resp. rate 14, SpO2 97.00%.  Disposition: Home   Medication List  As of 09/11/2011  7:47 AM   TAKE these medications         cyclobenzaprine 10 MG tablet   Commonly known as: FLEXERIL   Take 0.5-1 tablets (5-10 mg total) by mouth 3 (three) times daily as needed for muscle spasms.      diazepam 5 MG tablet   Commonly known as: VALIUM   Take 5 mg by mouth every 8 (eight) hours as needed. For anxiety      glyBURIDE-metformin 5-500 MG per tablet   Commonly known as: GLUCOVANCE   Take 2 tablets by mouth 2 (two) times daily with a meal.      ibuprofen 800 MG tablet   Commonly known as: ADVIL,MOTRIN   Take 800 mg by mouth every 8 (eight) hours as needed. For pain      metoprolol-hydrochlorothiazide 50-25 MG per tablet   Commonly known as: LOPRESSOR HCT   Take 1 tablet by mouth 2 (two) times daily.      mulitivitamin with minerals Tabs   Take 1 tablet by mouth daily.      omeprazole 20 MG capsule   Commonly  known as: PRILOSEC   Take 20 mg by mouth daily.      oxyCODONE-acetaminophen 5-325 MG per tablet   Commonly known as: PERCOCET   Take 1-2 tablets by mouth every 4 (four) hours as needed for pain.      potassium chloride 10 MEQ tablet   Commonly known as: K-DUR   Take 10 mEq by mouth daily.      pravastatin 80 MG tablet   Commonly known as: PRAVACHOL   Take 80 mg by mouth daily.             Signed: Hewitt Shorts, MD 09/11/2011, 7:47 AM

## 2012-04-21 ENCOUNTER — Inpatient Hospital Stay: Payer: Self-pay | Admitting: Internal Medicine

## 2012-04-21 LAB — CK TOTAL AND CKMB (NOT AT ARMC)
CK, Total: 20 U/L — ABNORMAL LOW (ref 21–215)
CK-MB: <0.5 ng/mL — ABNORMAL LOW (ref 0.5–3.6)

## 2012-04-21 LAB — CBC WITH DIFFERENTIAL/PLATELET
Basophil #: 0.2 10*3/uL — ABNORMAL HIGH (ref 0.0–0.1)
Basophil %: 1.1 %
Eosinophil #: 0.9 10*3/uL — ABNORMAL HIGH (ref 0.0–0.7)
Eosinophil %: 4.1 %
HCT: 31.7 % — ABNORMAL LOW (ref 35.0–47.0)
HGB: 10.4 g/dL — ABNORMAL LOW (ref 12.0–16.0)
Lymphocyte #: 1.8 10*3/uL (ref 1.0–3.6)
Lymphocyte %: 8.2 %
MCH: 28.3 pg (ref 26.0–34.0)
MCHC: 32.7 g/dL (ref 32.0–36.0)
MCV: 86 fL (ref 80–100)
Monocyte #: 1.4 x10 3/mm — ABNORMAL HIGH (ref 0.2–0.9)
Monocyte %: 6.5 %
Neutrophil #: 17.1 10*3/uL — ABNORMAL HIGH (ref 1.4–6.5)
Neutrophil %: 80.1 %
Platelet: 758 10*3/uL — ABNORMAL HIGH (ref 150–440)
RBC: 3.67 10*6/uL — ABNORMAL LOW (ref 3.80–5.20)
RDW: 13.8 % (ref 11.5–14.5)
WBC: 21.4 10*3/uL — ABNORMAL HIGH (ref 3.6–11.0)

## 2012-04-21 LAB — COMPREHENSIVE METABOLIC PANEL
Albumin: 2.3 g/dL — ABNORMAL LOW (ref 3.4–5.0)
Alkaline Phosphatase: 175 U/L — ABNORMAL HIGH (ref 50–136)
Anion Gap: 14 (ref 7–16)
BUN: 28 mg/dL — ABNORMAL HIGH (ref 7–18)
Bilirubin,Total: 0.6 mg/dL (ref 0.2–1.0)
Calcium, Total: 9.1 mg/dL (ref 8.5–10.1)
Chloride: 88 mmol/L — ABNORMAL LOW (ref 98–107)
Co2: 32 mmol/L (ref 21–32)
Creatinine: 1.37 mg/dL — ABNORMAL HIGH (ref 0.60–1.30)
EGFR (African American): 47 — ABNORMAL LOW
EGFR (Non-African Amer.): 41 — ABNORMAL LOW
Glucose: 243 mg/dL — ABNORMAL HIGH (ref 65–99)
Osmolality: 282 (ref 275–301)
Potassium: 2.8 mmol/L — ABNORMAL LOW (ref 3.5–5.1)
SGOT(AST): 15 U/L (ref 15–37)
SGPT (ALT): 16 U/L (ref 12–78)
Sodium: 134 mmol/L — ABNORMAL LOW (ref 136–145)
Total Protein: 7.5 g/dL (ref 6.4–8.2)

## 2012-04-21 LAB — URINALYSIS, COMPLETE
Bilirubin,UR: NEGATIVE
Blood: NEGATIVE
Glucose,UR: NEGATIVE mg/dL (ref 0–75)
Hyaline Cast: 12
Ketone: NEGATIVE
Leukocyte Esterase: NEGATIVE
Nitrite: NEGATIVE
Ph: 5 (ref 4.5–8.0)
Protein: NEGATIVE
RBC,UR: <1 /HPF (ref 0–5)
Specific Gravity: 1.014 (ref 1.003–1.030)
Squamous Epithelial: NONE SEEN
WBC UR: 5 /HPF (ref 0–5)

## 2012-04-21 LAB — TROPONIN I: Troponin-I: <0.02 ng/mL

## 2012-04-22 LAB — COMPREHENSIVE METABOLIC PANEL
Albumin: 1.9 g/dL — ABNORMAL LOW (ref 3.4–5.0)
Alkaline Phosphatase: 129 U/L (ref 50–136)
Anion Gap: 10 (ref 7–16)
BUN: 17 mg/dL (ref 7–18)
Bilirubin,Total: 0.4 mg/dL (ref 0.2–1.0)
Calcium, Total: 7.9 mg/dL — ABNORMAL LOW (ref 8.5–10.1)
Chloride: 103 mmol/L (ref 98–107)
Co2: 29 mmol/L (ref 21–32)
Creatinine: 0.99 mg/dL (ref 0.60–1.30)
EGFR (African American): >60
EGFR (Non-African Amer.): >60
Glucose: 203 mg/dL — ABNORMAL HIGH (ref 65–99)
Osmolality: 290 (ref 275–301)
Potassium: 3 mmol/L — ABNORMAL LOW (ref 3.5–5.1)
SGOT(AST): 11 U/L — ABNORMAL LOW (ref 15–37)
SGPT (ALT): 13 U/L (ref 12–78)
Sodium: 142 mmol/L (ref 136–145)
Total Protein: 6 g/dL — ABNORMAL LOW (ref 6.4–8.2)

## 2012-04-22 LAB — CBC WITH DIFFERENTIAL/PLATELET
Basophil #: 0.1 10*3/uL (ref 0.0–0.1)
Basophil %: 0.5 %
Eosinophil #: 0.7 10*3/uL (ref 0.0–0.7)
Eosinophil %: 5.2 %
HCT: 25.5 % — ABNORMAL LOW (ref 35.0–47.0)
HGB: 8.4 g/dL — ABNORMAL LOW (ref 12.0–16.0)
Lymphocyte #: 2.1 10*3/uL (ref 1.0–3.6)
Lymphocyte %: 16 %
MCH: 28.6 pg (ref 26.0–34.0)
MCHC: 32.8 g/dL (ref 32.0–36.0)
MCV: 87 fL (ref 80–100)
Monocyte #: 1 x10 3/mm — ABNORMAL HIGH (ref 0.2–0.9)
Monocyte %: 7.5 %
Neutrophil #: 9.5 10*3/uL — ABNORMAL HIGH (ref 1.4–6.5)
Neutrophil %: 70.8 %
Platelet: 624 10*3/uL — ABNORMAL HIGH (ref 150–440)
RBC: 2.94 10*6/uL — ABNORMAL LOW (ref 3.80–5.20)
RDW: 13.9 % (ref 11.5–14.5)
WBC: 13.4 10*3/uL — ABNORMAL HIGH (ref 3.6–11.0)

## 2012-04-22 LAB — MAGNESIUM: Magnesium: 0.6 mg/dL — ABNORMAL LOW

## 2012-04-22 LAB — URINE CULTURE

## 2012-04-22 LAB — HEMOGLOBIN A1C: Hemoglobin A1C: 9.3 % — ABNORMAL HIGH (ref 4.2–6.3)

## 2012-04-23 LAB — BASIC METABOLIC PANEL
Anion Gap: 9 (ref 7–16)
BUN: 11 mg/dL (ref 7–18)
Calcium, Total: 7.9 mg/dL — ABNORMAL LOW (ref 8.5–10.1)
Chloride: 105 mmol/L (ref 98–107)
Co2: 29 mmol/L (ref 21–32)
Creatinine: 0.98 mg/dL (ref 0.60–1.30)
EGFR (African American): >60
EGFR (Non-African Amer.): >60
Glucose: 94 mg/dL (ref 65–99)
Osmolality: 284 (ref 275–301)
Potassium: 3 mmol/L — ABNORMAL LOW (ref 3.5–5.1)
Sodium: 143 mmol/L (ref 136–145)

## 2012-04-23 LAB — MAGNESIUM: Magnesium: 1 mg/dL — ABNORMAL LOW

## 2012-04-23 LAB — SEDIMENTATION RATE: Erythrocyte Sed Rate: 73 mm/hr — ABNORMAL HIGH (ref 0–30)

## 2012-04-23 LAB — CBC WITH DIFFERENTIAL/PLATELET
Basophil #: 0.1 x10 3/mm 3
Basophil %: 0.8 %
Eosinophil #: 0.8 x10 3/mm 3 — ABNORMAL HIGH
Eosinophil %: 6.1 %
HCT: 23.6 % — ABNORMAL LOW
HGB: 7.1 g/dL — ABNORMAL LOW
Lymphocyte %: 15.9 %
Lymphs Abs: 2.2 x10 3/mm 3
MCH: 26.5 pg
MCHC: 30.3 g/dL — ABNORMAL LOW
MCV: 88 fL
Monocyte #: 1 "x10 3/mm " — ABNORMAL HIGH
Monocyte %: 7.1 %
Neutrophil #: 9.6 x10 3/mm 3 — ABNORMAL HIGH
Neutrophil %: 70.1 %
Platelet: 550 x10 3/mm 3 — ABNORMAL HIGH
RBC: 2.7 X10 6/mm 3 — ABNORMAL LOW
RDW: 13.8 %
WBC: 13.7 x10 3/mm 3 — ABNORMAL HIGH

## 2012-04-24 LAB — CBC WITH DIFFERENTIAL/PLATELET
Basophil #: 0.2 10*3/uL — ABNORMAL HIGH (ref 0.0–0.1)
Basophil %: 1.5 %
Eosinophil #: 1 10*3/uL — ABNORMAL HIGH (ref 0.0–0.7)
Eosinophil %: 7.9 %
HCT: 25.3 % — ABNORMAL LOW (ref 35.0–47.0)
HGB: 8.4 g/dL — ABNORMAL LOW (ref 12.0–16.0)
Lymphocyte #: 2 10*3/uL (ref 1.0–3.6)
Lymphocyte %: 16.1 %
MCH: 28.7 pg (ref 26.0–34.0)
MCHC: 33.1 g/dL (ref 32.0–36.0)
MCV: 87 fL (ref 80–100)
Monocyte #: 1 x10 3/mm — ABNORMAL HIGH (ref 0.2–0.9)
Monocyte %: 7.6 %
Neutrophil #: 8.4 10*3/uL — ABNORMAL HIGH (ref 1.4–6.5)
Neutrophil %: 66.9 %
Platelet: 557 10*3/uL — ABNORMAL HIGH (ref 150–440)
RBC: 2.93 10*6/uL — ABNORMAL LOW (ref 3.80–5.20)
RDW: 13.7 % (ref 11.5–14.5)
WBC: 12.5 10*3/uL — ABNORMAL HIGH (ref 3.6–11.0)

## 2012-04-24 LAB — OCCULT BLOOD X 1 CARD TO LAB, STOOL: Occult Blood, Feces: NEGATIVE

## 2012-04-24 LAB — MAGNESIUM: Magnesium: 1 mg/dL — ABNORMAL LOW

## 2012-04-24 LAB — POTASSIUM: Potassium: 3.4 mmol/L — ABNORMAL LOW (ref 3.5–5.1)

## 2012-04-25 LAB — CBC WITH DIFFERENTIAL/PLATELET
Basophil #: 0.1 10*3/uL (ref 0.0–0.1)
Basophil %: 0.7 %
Eosinophil #: 1 10*3/uL — ABNORMAL HIGH (ref 0.0–0.7)
Eosinophil %: 8.9 %
HCT: 26.5 % — ABNORMAL LOW (ref 35.0–47.0)
HGB: 9 g/dL — ABNORMAL LOW (ref 12.0–16.0)
Lymphocyte #: 1.9 10*3/uL (ref 1.0–3.6)
Lymphocyte %: 16.5 %
MCH: 29.4 pg (ref 26.0–34.0)
MCHC: 34 g/dL (ref 32.0–36.0)
MCV: 86 fL (ref 80–100)
Monocyte #: 0.9 x10 3/mm (ref 0.2–0.9)
Monocyte %: 8 %
Neutrophil #: 7.7 10*3/uL — ABNORMAL HIGH (ref 1.4–6.5)
Neutrophil %: 65.9 %
Platelet: 596 10*3/uL — ABNORMAL HIGH (ref 150–440)
RBC: 3.07 10*6/uL — ABNORMAL LOW (ref 3.80–5.20)
RDW: 13.9 % (ref 11.5–14.5)
WBC: 11.6 10*3/uL — ABNORMAL HIGH (ref 3.6–11.0)

## 2012-04-25 LAB — IRON AND TIBC
Iron Bind.Cap.(Total): 156 ug/dL — ABNORMAL LOW (ref 250–450)
Iron Saturation: 17 %
Iron: 27 ug/dL — ABNORMAL LOW (ref 50–170)
Unbound Iron-Bind.Cap.: 129 ug/dL

## 2012-04-25 LAB — MAGNESIUM: Magnesium: 1.3 mg/dL — ABNORMAL LOW

## 2012-04-25 LAB — POTASSIUM: Potassium: 3.7 mmol/L (ref 3.5–5.1)

## 2012-04-25 LAB — FERRITIN: Ferritin (ARMC): 519 ng/mL — ABNORMAL HIGH (ref 8–388)

## 2012-04-26 LAB — CULTURE, BLOOD (SINGLE)

## 2012-04-27 LAB — PATHOLOGY REPORT

## 2012-05-17 ENCOUNTER — Ambulatory Visit: Payer: Self-pay | Admitting: Internal Medicine

## 2012-05-25 LAB — CBC CANCER CENTER
Eosinophil: 6 %
HCT: 36 % (ref 35.0–47.0)
HGB: 11.2 g/dL — ABNORMAL LOW (ref 12.0–16.0)
Lymphocytes: 24 %
MCH: 26.8 pg (ref 26.0–34.0)
MCHC: 31 g/dL — ABNORMAL LOW (ref 32.0–36.0)
MCV: 86 fL (ref 80–100)
Monocytes: 5 %
Platelet: 425 x10 3/mm (ref 150–440)
RBC: 4.17 10*6/uL (ref 3.80–5.20)
RDW: 15.8 % — ABNORMAL HIGH (ref 11.5–14.5)
Segmented Neutrophils: 65 %
WBC: 11.4 x10 3/mm — ABNORMAL HIGH (ref 3.6–11.0)

## 2012-05-25 LAB — RETICULOCYTES
Absolute Retic Count: 0.1253 10*6/uL — ABNORMAL HIGH (ref 0.023–0.096)
Reticulocyte: 3.01 % — ABNORMAL HIGH (ref 0.5–2.2)

## 2012-05-25 LAB — FOLATE: Folic Acid: 10.3 ng/mL (ref 3.1–100.0)

## 2012-05-25 LAB — LACTATE DEHYDROGENASE: LDH: 209 U/L (ref 81–246)

## 2012-05-26 LAB — PROT IMMUNOELECTROPHORES(ARMC)

## 2012-06-05 ENCOUNTER — Ambulatory Visit: Payer: Self-pay | Admitting: Internal Medicine

## 2012-06-13 DIAGNOSIS — M79605 Pain in left leg: Secondary | ICD-10-CM | POA: Insufficient documentation

## 2012-06-13 DIAGNOSIS — D649 Anemia, unspecified: Secondary | ICD-10-CM | POA: Insufficient documentation

## 2012-06-15 LAB — CULTURE, FUNGUS WITHOUT SMEAR

## 2012-06-22 ENCOUNTER — Ambulatory Visit: Payer: Self-pay | Admitting: Nurse Practitioner

## 2012-06-30 ENCOUNTER — Ambulatory Visit: Payer: Self-pay | Admitting: Nurse Practitioner

## 2012-07-02 LAB — CULTURE, BLOOD (SINGLE)

## 2012-07-06 ENCOUNTER — Ambulatory Visit: Payer: Self-pay | Admitting: Internal Medicine

## 2012-07-06 HISTORY — PX: TOTAL HIP ARTHROPLASTY: SHX124

## 2012-07-06 HISTORY — PX: JOINT REPLACEMENT: SHX530

## 2012-07-18 ENCOUNTER — Ambulatory Visit: Payer: Self-pay | Admitting: Nurse Practitioner

## 2012-09-03 ENCOUNTER — Ambulatory Visit: Payer: Self-pay | Admitting: Internal Medicine

## 2012-09-12 LAB — CBC CANCER CENTER
Basophil #: 0.1 x10 3/mm (ref 0.0–0.1)
Basophil %: 0.5 %
Eosinophil #: 0.8 x10 3/mm — ABNORMAL HIGH (ref 0.0–0.7)
Eosinophil %: 7.7 %
HCT: 38.4 % (ref 35.0–47.0)
HGB: 12.9 g/dL (ref 12.0–16.0)
Lymphocyte #: 3.1 x10 3/mm (ref 1.0–3.6)
Lymphocyte %: 29.1 %
MCH: 29.8 pg (ref 26.0–34.0)
MCHC: 33.6 g/dL (ref 32.0–36.0)
MCV: 89 fL (ref 80–100)
Monocyte #: 0.7 x10 3/mm (ref 0.2–0.9)
Monocyte %: 6.6 %
Neutrophil #: 5.9 x10 3/mm (ref 1.4–6.5)
Neutrophil %: 56.1 %
Platelet: 292 x10 3/mm (ref 150–440)
RBC: 4.33 10*6/uL (ref 3.80–5.20)
RDW: 15.1 % — ABNORMAL HIGH (ref 11.5–14.5)
WBC: 10.6 x10 3/mm (ref 3.6–11.0)

## 2012-09-12 LAB — IRON AND TIBC
Iron Bind.Cap.(Total): 322 ug/dL (ref 250–450)
Iron Saturation: 25 %
Iron: 81 ug/dL (ref 50–170)
Unbound Iron-Bind.Cap.: 241 ug/dL

## 2012-09-12 LAB — FERRITIN: Ferritin (ARMC): 59 ng/mL (ref 8–388)

## 2012-09-14 ENCOUNTER — Ambulatory Visit: Payer: Self-pay | Admitting: Nurse Practitioner

## 2012-09-30 ENCOUNTER — Ambulatory Visit: Payer: Self-pay | Admitting: Orthopedic Surgery

## 2012-10-04 ENCOUNTER — Ambulatory Visit: Payer: Self-pay | Admitting: Internal Medicine

## 2012-10-28 ENCOUNTER — Ambulatory Visit: Payer: Self-pay | Admitting: Internal Medicine

## 2012-11-03 ENCOUNTER — Ambulatory Visit: Payer: Self-pay | Admitting: Internal Medicine

## 2012-11-21 ENCOUNTER — Ambulatory Visit: Payer: Self-pay | Admitting: Orthopedic Surgery

## 2012-11-21 LAB — BASIC METABOLIC PANEL
Anion Gap: 9 (ref 7–16)
BUN: 15 mg/dL (ref 7–18)
Calcium, Total: 9.5 mg/dL (ref 8.5–10.1)
Chloride: 105 mmol/L (ref 98–107)
Co2: 27 mmol/L (ref 21–32)
Creatinine: 0.86 mg/dL (ref 0.60–1.30)
EGFR (African American): >60
EGFR (Non-African Amer.): >60
Glucose: 83 mg/dL (ref 65–99)
Osmolality: 281 (ref 275–301)
Potassium: 3.6 mmol/L (ref 3.5–5.1)
Sodium: 141 mmol/L (ref 136–145)

## 2012-11-21 LAB — URINALYSIS, COMPLETE
Bacteria: NONE SEEN
Bilirubin,UR: NEGATIVE
Blood: NEGATIVE
Glucose,UR: NEGATIVE mg/dL (ref 0–75)
Ketone: NEGATIVE
Leukocyte Esterase: NEGATIVE
Nitrite: NEGATIVE
Ph: 5 (ref 4.5–8.0)
Protein: NEGATIVE
RBC,UR: <1 /HPF (ref 0–5)
Specific Gravity: 1.009 (ref 1.003–1.030)
Squamous Epithelial: 1
WBC UR: <1 /HPF (ref 0–5)

## 2012-11-21 LAB — PROTIME-INR
INR: 0.9
Prothrombin Time: 12.2 secs (ref 11.5–14.7)

## 2012-11-21 LAB — CBC
HCT: 37.8 % (ref 35.0–47.0)
HGB: 13.1 g/dL (ref 12.0–16.0)
MCH: 31.6 pg (ref 26.0–34.0)
MCHC: 34.8 g/dL (ref 32.0–36.0)
MCV: 91 fL (ref 80–100)
Platelet: 287 10*3/uL (ref 150–440)
RBC: 4.16 10*6/uL (ref 3.80–5.20)
RDW: 13.1 % (ref 11.5–14.5)
WBC: 10.1 10*3/uL (ref 3.6–11.0)

## 2012-11-21 LAB — APTT: Activated PTT: 26.4 secs (ref 23.6–35.9)

## 2012-11-21 LAB — MRSA PCR SCREENING

## 2012-11-29 ENCOUNTER — Inpatient Hospital Stay: Payer: Self-pay | Admitting: Orthopedic Surgery

## 2012-11-30 LAB — CBC WITH DIFFERENTIAL/PLATELET
Basophil #: 0 10*3/uL (ref 0.0–0.1)
Basophil %: 0.1 %
Eosinophil #: 0 10*3/uL (ref 0.0–0.7)
Eosinophil %: 0 %
HCT: 24.3 % — ABNORMAL LOW (ref 35.0–47.0)
HGB: 8.4 g/dL — ABNORMAL LOW (ref 12.0–16.0)
Lymphocyte #: 1.4 10*3/uL (ref 1.0–3.6)
Lymphocyte %: 10.5 %
MCH: 31.4 pg (ref 26.0–34.0)
MCHC: 34.6 g/dL (ref 32.0–36.0)
MCV: 91 fL (ref 80–100)
Monocyte #: 1.5 x10 3/mm — ABNORMAL HIGH (ref 0.2–0.9)
Monocyte %: 10.9 %
Neutrophil #: 10.6 10*3/uL — ABNORMAL HIGH (ref 1.4–6.5)
Neutrophil %: 78.5 %
Platelet: 205 10*3/uL (ref 150–440)
RBC: 2.69 10*6/uL — ABNORMAL LOW (ref 3.80–5.20)
RDW: 12.8 % (ref 11.5–14.5)
WBC: 13.5 10*3/uL — ABNORMAL HIGH (ref 3.6–11.0)

## 2012-11-30 LAB — BASIC METABOLIC PANEL
Anion Gap: 7 (ref 7–16)
BUN: 22 mg/dL — ABNORMAL HIGH (ref 7–18)
Calcium, Total: 7.9 mg/dL — ABNORMAL LOW (ref 8.5–10.1)
Chloride: 108 mmol/L — ABNORMAL HIGH (ref 98–107)
Co2: 26 mmol/L (ref 21–32)
Creatinine: 1.1 mg/dL (ref 0.60–1.30)
EGFR (African American): >60
EGFR (Non-African Amer.): 53 — ABNORMAL LOW
Glucose: 191 mg/dL — ABNORMAL HIGH (ref 65–99)
Osmolality: 290 (ref 275–301)
Potassium: 4.1 mmol/L (ref 3.5–5.1)
Sodium: 141 mmol/L (ref 136–145)

## 2012-12-01 LAB — PATHOLOGY REPORT

## 2012-12-01 LAB — CBC WITH DIFFERENTIAL/PLATELET
Basophil #: 0 10*3/uL (ref 0.0–0.1)
Basophil #: 0 10*3/uL (ref 0.0–0.1)
Basophil %: 0.4 %
Basophil %: 0.3 %
Eosinophil #: 0.1 10*3/uL (ref 0.0–0.7)
Eosinophil #: 0.4 10*3/uL (ref 0.0–0.7)
Eosinophil %: 1.2 %
Eosinophil %: 3.5 %
HCT: 22.2 % — ABNORMAL LOW (ref 35.0–47.0)
HCT: 23 % — ABNORMAL LOW (ref 35.0–47.0)
HGB: 7.9 g/dL — ABNORMAL LOW (ref 12.0–16.0)
HGB: 8.1 g/dL — ABNORMAL LOW (ref 12.0–16.0)
Lymphocyte #: 2.6 10*3/uL (ref 1.0–3.6)
Lymphocyte #: 2 10*3/uL (ref 1.0–3.6)
Lymphocyte %: 24.5 %
Lymphocyte %: 19.2 %
MCH: 32.4 pg (ref 26.0–34.0)
MCH: 32.1 pg (ref 26.0–34.0)
MCHC: 35.8 g/dL (ref 32.0–36.0)
MCHC: 35.3 g/dL (ref 32.0–36.0)
MCV: 91 fL (ref 80–100)
MCV: 91 fL (ref 80–100)
Monocyte #: 1.1 x10 3/mm — ABNORMAL HIGH (ref 0.2–0.9)
Monocyte #: 1.2 x10 3/mm — ABNORMAL HIGH (ref 0.2–0.9)
Monocyte %: 10.6 %
Monocyte %: 11.8 %
Neutrophil #: 6.6 10*3/uL — ABNORMAL HIGH (ref 1.4–6.5)
Neutrophil #: 6.9 10*3/uL — ABNORMAL HIGH (ref 1.4–6.5)
Neutrophil %: 63.3 %
Neutrophil %: 65.2 %
Platelet: 171 10*3/uL (ref 150–440)
Platelet: 185 10*3/uL (ref 150–440)
RBC: 2.45 10*6/uL — ABNORMAL LOW (ref 3.80–5.20)
RBC: 2.52 10*6/uL — ABNORMAL LOW (ref 3.80–5.20)
RDW: 12.8 % (ref 11.5–14.5)
RDW: 12.7 % (ref 11.5–14.5)
WBC: 10.4 10*3/uL (ref 3.6–11.0)
WBC: 10.6 10*3/uL (ref 3.6–11.0)

## 2012-12-02 LAB — CBC WITH DIFFERENTIAL/PLATELET
Basophil #: 0 10*3/uL (ref 0.0–0.1)
Basophil %: 0.4 %
Eosinophil #: 0.5 10*3/uL (ref 0.0–0.7)
Eosinophil %: 5.1 %
HCT: 23.1 % — ABNORMAL LOW (ref 35.0–47.0)
HGB: 8.2 g/dL — ABNORMAL LOW (ref 12.0–16.0)
Lymphocyte #: 1.9 10*3/uL (ref 1.0–3.6)
Lymphocyte %: 21 %
MCH: 32 pg (ref 26.0–34.0)
MCHC: 35.3 g/dL (ref 32.0–36.0)
MCV: 91 fL (ref 80–100)
Monocyte #: 0.9 x10 3/mm (ref 0.2–0.9)
Monocyte %: 10.2 %
Neutrophil #: 5.7 10*3/uL (ref 1.4–6.5)
Neutrophil %: 63.3 %
Platelet: 196 10*3/uL (ref 150–440)
RBC: 2.55 10*6/uL — ABNORMAL LOW (ref 3.80–5.20)
RDW: 12.3 % (ref 11.5–14.5)
WBC: 9.1 10*3/uL (ref 3.6–11.0)

## 2013-03-08 IMAGING — US ABDOMEN ULTRASOUND LIMITED
1 series · 14 of 25 positions shown · non-contrast
Comparison: none

REASON FOR EXAM: elevated alkaline level
COMMENTS:

[Series 1: abdomen ultrasound limited · 0.31mm/px · 14 of 121 slices shown]
[im 1/121]
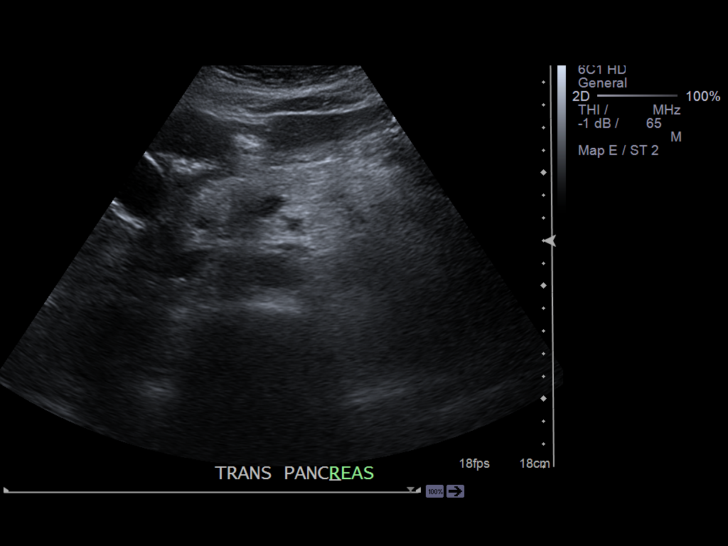
[im 11/121]
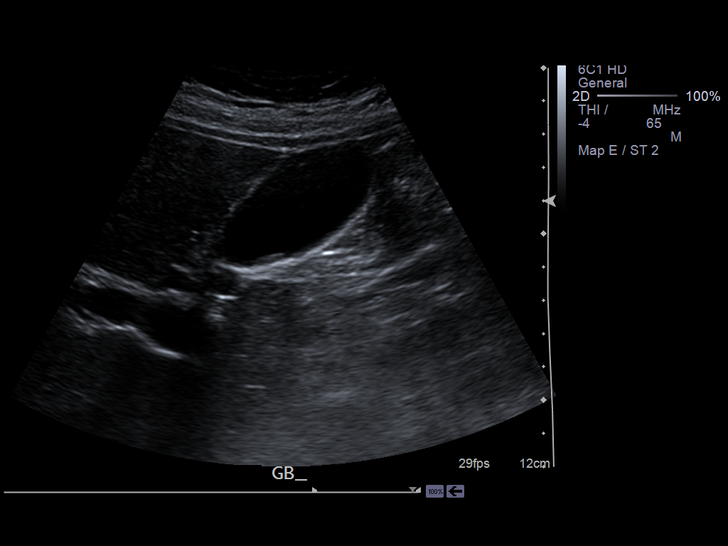
[im 21/121]
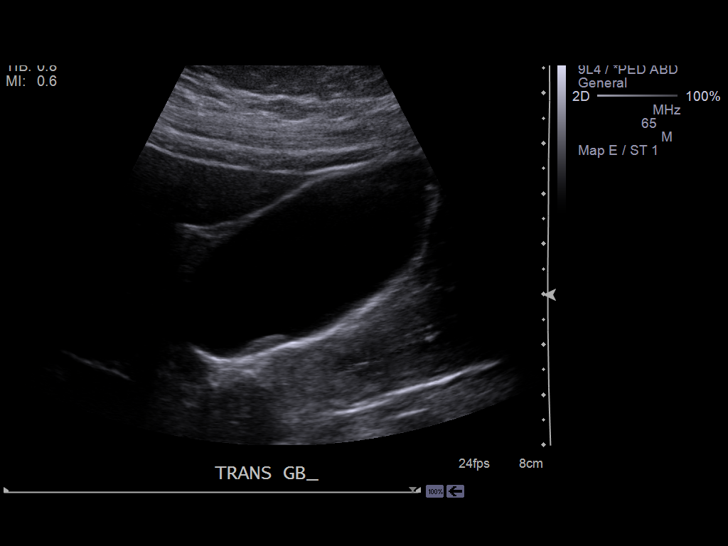
[im 31/121]
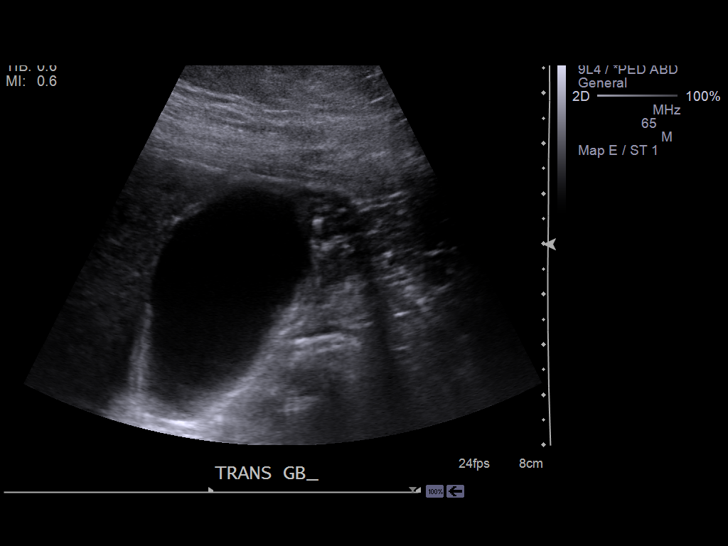
[im 41/121]
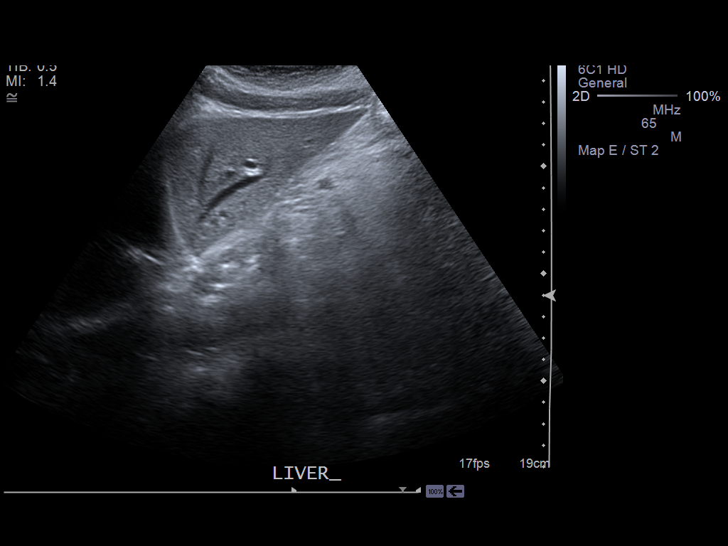
[im 46/121]
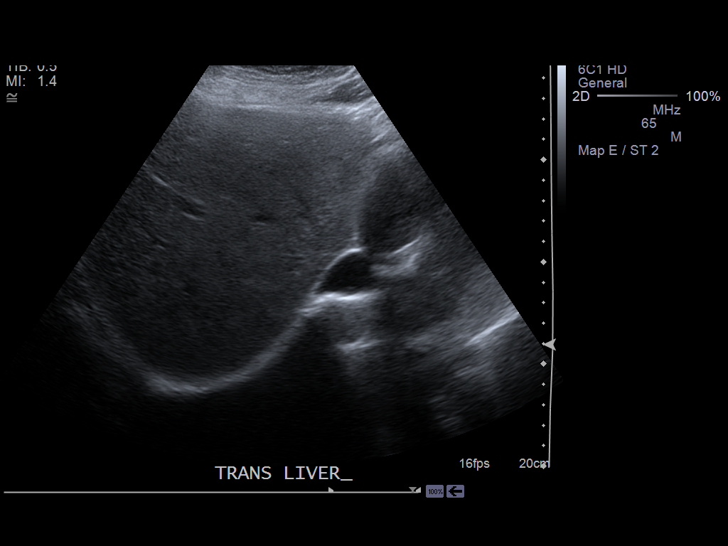
[im 56/121]
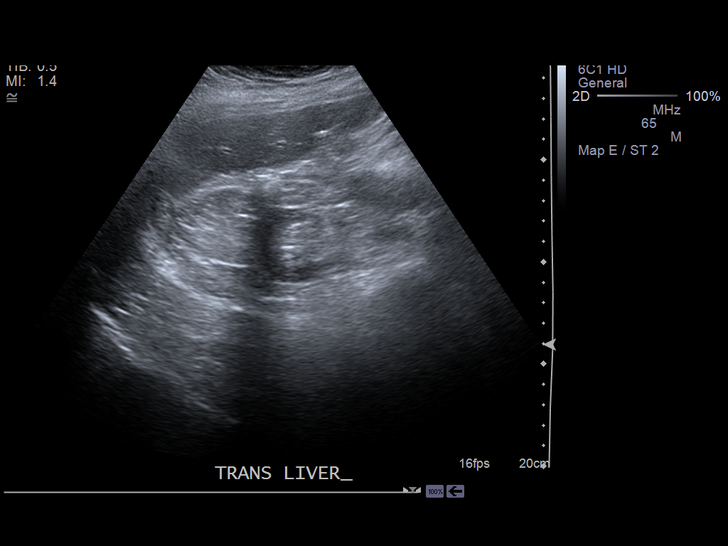
[im 66/121]
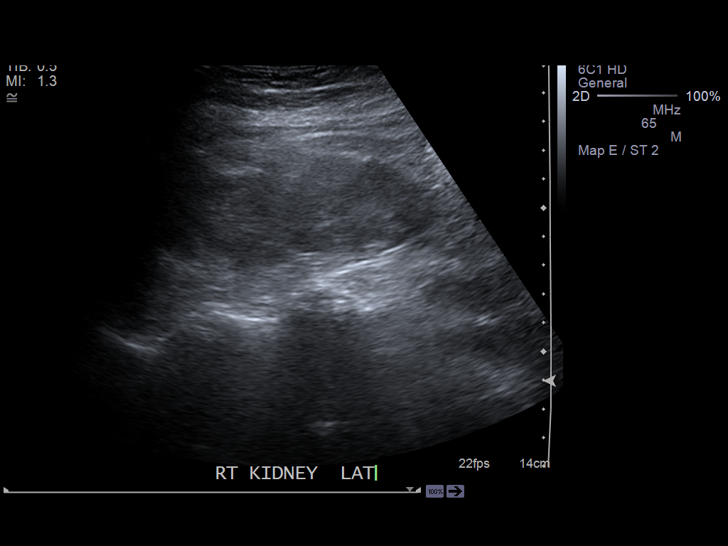
[im 76/121]
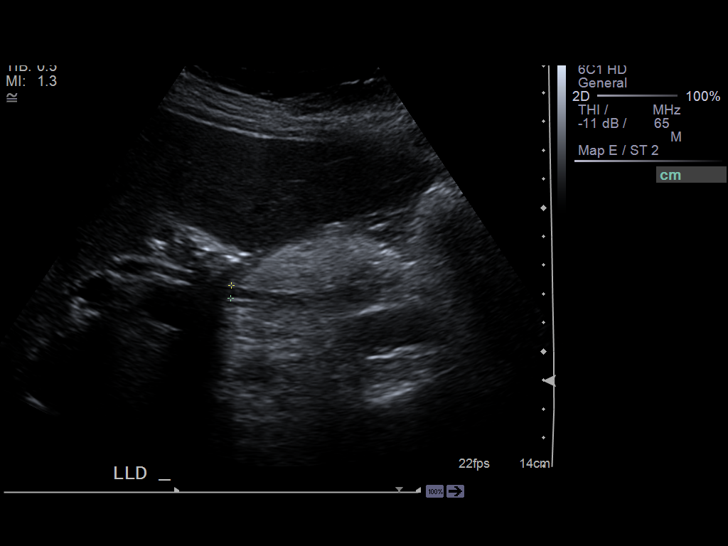
[im 81/121]
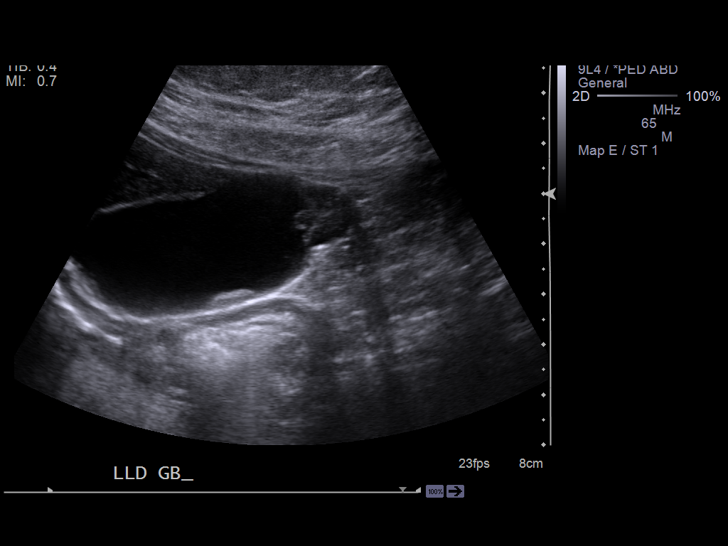
[im 91/121]
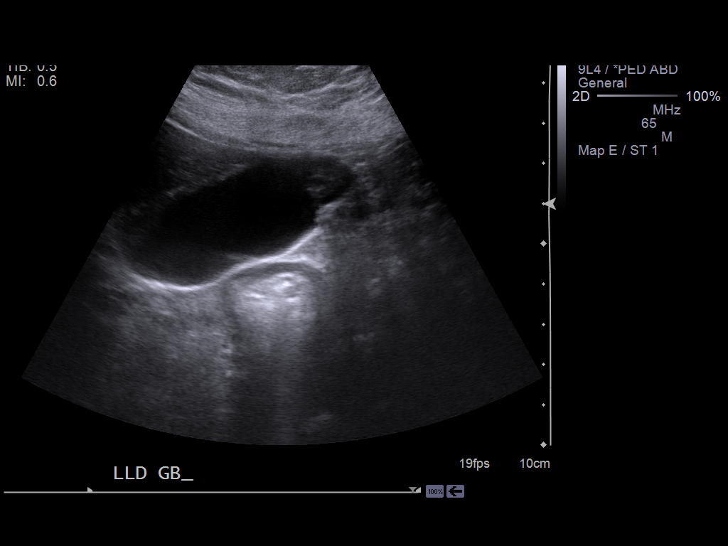
[im 101/121]
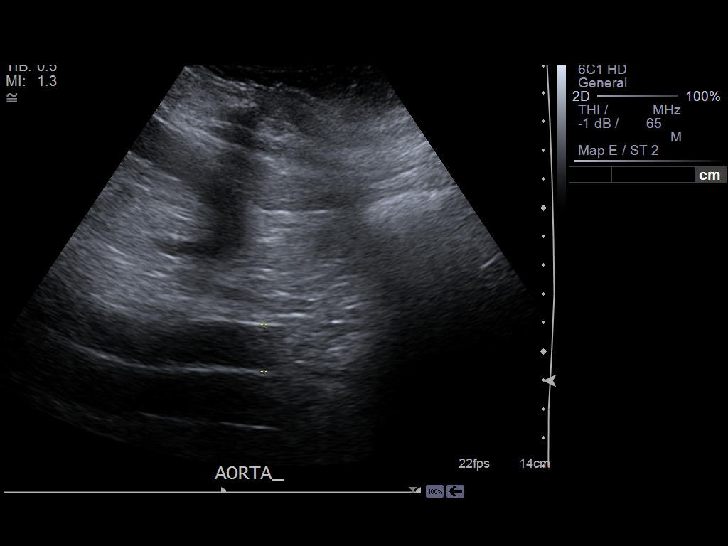
[im 111/121]
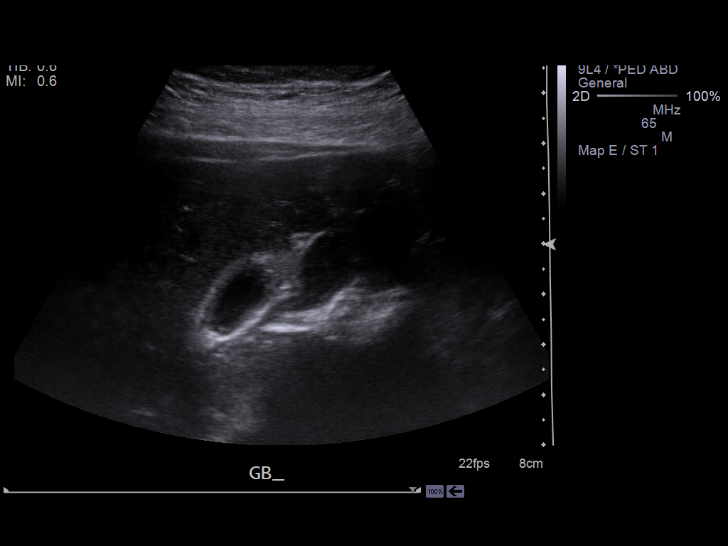
[im 121/121]
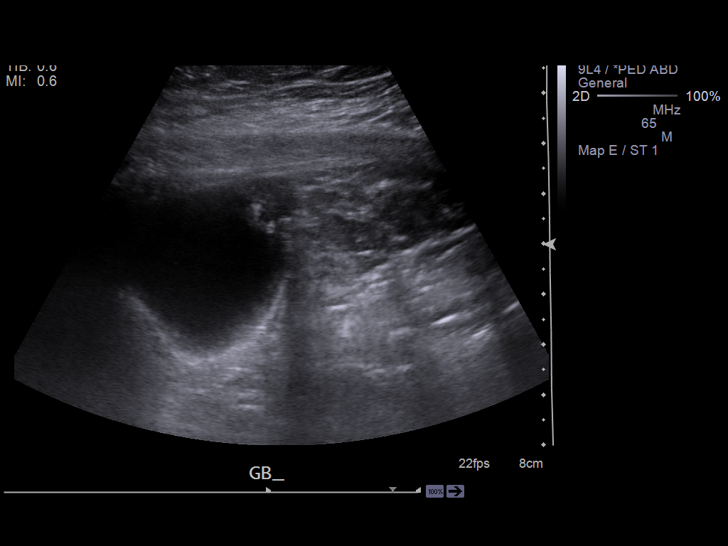

[14 of 25 positions shown; findings below may reference images not displayed]

PROCEDURE:     ELHAMDULILAH - ELHAMDULILAH ABDOMEN UPPER GENERAL  - June 22, 2012 [DATE]

RESULT:     The liver exhibits normal echotexture with no focal mass nor
ductal dilation. Portal venous flow is normal in direction toward the liver.
The gallbladder exhibits the presence of sludge and likely tiny mobile
stones. There is no sonographic Murphy's sign or gallbladder wall
thickening. The common bile duct is normal at 4.5 mm in diameter.

The pancreas, spleen, abdominal aorta, and inferior vena cava are normal in
appearance. The kidneys demonstrate normal echotexture and contour with no
evidence of stones or obstruction. The right kidney measures 9.8 cm in
length and the left kidney 9.7 centimeters in length. There is no evidence
of ascites.
IMPRESSION: 1. There is sludge and possible stones within the gallbladder. There is no
sonographic evidence of acute cholecystitis.
2. I do not see abnormality elsewhere within the abdomen.

[REDACTED]

## 2013-04-11 ENCOUNTER — Ambulatory Visit (INDEPENDENT_AMBULATORY_CARE_PROVIDER_SITE_OTHER): Payer: Medicare Other | Admitting: Adult Health

## 2013-04-11 ENCOUNTER — Encounter: Payer: Self-pay | Admitting: Adult Health

## 2013-04-11 VITALS — BP 130/66 | HR 76 | Temp 98.2°F | Resp 12 | Ht 64.25 in | Wt 168.0 lb

## 2013-04-11 DIAGNOSIS — E1169 Type 2 diabetes mellitus with other specified complication: Secondary | ICD-10-CM | POA: Insufficient documentation

## 2013-04-11 DIAGNOSIS — E119 Type 2 diabetes mellitus without complications: Secondary | ICD-10-CM

## 2013-04-11 DIAGNOSIS — E1110 Type 2 diabetes mellitus with ketoacidosis without coma: Secondary | ICD-10-CM | POA: Insufficient documentation

## 2013-04-11 DIAGNOSIS — J45909 Unspecified asthma, uncomplicated: Secondary | ICD-10-CM

## 2013-04-11 DIAGNOSIS — I10 Essential (primary) hypertension: Secondary | ICD-10-CM | POA: Insufficient documentation

## 2013-04-11 DIAGNOSIS — E785 Hyperlipidemia, unspecified: Secondary | ICD-10-CM | POA: Insufficient documentation

## 2013-04-11 DIAGNOSIS — J4520 Mild intermittent asthma, uncomplicated: Secondary | ICD-10-CM | POA: Insufficient documentation

## 2013-04-11 MED ORDER — GLIPIZIDE 5 MG PO TABS: 5.0000 mg | ORAL_TABLET | Freq: Two times a day (BID) | ORAL | Status: DC

## 2013-04-11 MED ORDER — ALPRAZOLAM 0.25 MG PO TABS: 0.2500 mg | ORAL_TABLET | ORAL | Status: DC | PRN

## 2013-04-11 MED ORDER — ATORVASTATIN CALCIUM 40 MG PO TABS: 40.0000 mg | ORAL_TABLET | Freq: Every day | ORAL | Status: DC

## 2013-04-11 MED ORDER — GLUCOSE BLOOD VI STRP: ORAL_STRIP | Status: DC

## 2013-04-11 MED ORDER — METFORMIN HCL 500 MG PO TABS: 500.0000 mg | ORAL_TABLET | Freq: Two times a day (BID) | ORAL | Status: DC

## 2013-04-11 MED ORDER — ATENOLOL 50 MG PO TABS: 50.0000 mg | ORAL_TABLET | Freq: Two times a day (BID) | ORAL | Status: DC

## 2013-04-11 MED ORDER — OMEPRAZOLE 20 MG PO CPDR: 20.0000 mg | DELAYED_RELEASE_CAPSULE | Freq: Every day | ORAL | Status: DC

## 2013-04-11 NOTE — Patient Instructions (Addendum)
   Thank you for choosing [facility] for your health care needs.  Please have your labs drawn at your earliest convenience.  The results will be available through MyChart for your convenience. Please remember to activate this. The activation code is located at the end of this form.  Follow up in 3 months.

## 2013-04-11 NOTE — Assessment & Plan Note (Signed)
Change glucovance. Start glypizide 5 mg bid and metformin 500 mg bid. Check hgba1c.

## 2013-04-11 NOTE — Progress Notes (Signed)
Subjective:    Patient ID: Maria Hinton, female    DOB: 03-Mar-1946, 67 y.o.   MRN: [id number]  HPI  Patient presents to establish care. She was previously followed by Dr. Alison Murray who has moved out of the area. Pt is overall doing well. She was last seen by previous PCP in May 2014. Will request medical records. Patient brings a records of her most recent b/p readings. All are above goal and > 150/80. She takes Atenolol 50 mg bid. She has hx of DM type 2 and is taking glucovance. She reports this medication is too expensive. She would like a less expensive alternative.    Past Medical History  Diagnosis Date  . PONV (postoperative nausea and vomiting)   . Hypertension     takes Metoprolol/HCTZ daily  . Hyperlipidemia     takes Pravastatin daily  . Asthma   . Pneumonia     x 2 ;in Dec 2010/2011  . Arthritis   . Joint swelling   . Joint pain     fingers  . Chronic back pain     DDD,herniated disc/spondylosis/radiculopathy  . GERD (gastroesophageal reflux disease)     takes Omeprazole daily  . Hemorrhoids   . History of colonic polyps     colonoscopy 04/2012 - Dr Bluford Kaufmann  . Diabetes mellitus   . Early cataracts, bilateral     Dr. Jerolyn Center - [facility]  . Depression     after death of husband;takes Ativan prn     Past Surgical History  Procedure Laterality Date  . Thoracic disc surgery  at age 66 and 15     mass of veins that had gotten into muscle and wrapped around rib;3 ribs also removed from left side  . Abdominal hysterectomy      at age 57  . Back surgery  20+yrs ago  . Foot surgery  2011    bunionectomy and knot removed from bottom of both feet  . Melanoma excision  2011    on nose/back on neck  . Vagina surgery      vaginal wall ruptured   . Tonsillectomy  as a child    and adenoids   . Colonoscopy    . Esophagogastroduodenoscopy  2011  . Tubal ligation    . Lumbar laminectomy/decompression microdiscectomy  09/10/2011    Procedure: LUMBAR  LAMINECTOMY/DECOMPRESSION MICRODISCECTOMY 1 LEVEL;  Surgeon: Hewitt Shorts, MD;  Location: [facility];  Service: Neurosurgery;  Laterality: Right;  RIGHT Lumbar  Laminotomy and microdiskectomy Lumbar Four-Five  . Total hip arthroplasty Left 2014    Dr. Martha Clan     Family History  Problem Relation Age of Onset  . Anesthesia problems Neg Hx   . Hypotension Neg Hx   . Malignant hyperthermia Neg Hx   . Pseudochol deficiency Neg Hx   . Heart disease Brother     valve replacement  . COPD Brother   . COPD Mother     Emphysema  . Diabetes Mother   . Heart disease Father   . Seizures Father   . Cancer Father     Lung cancer with brain metastasis  . Asthma Brother   . COPD Son 2     History   Social History  . Marital Status: Single    Spouse Name: N/A    Number of Children: N/A  . Years of Education: N/A   Occupational History  . Not on file.   Social History Main Topics  .  Smoking status: Current Every Day Smoker -- 1.00 packs/day for 5 years    Types: Cigarettes  . Smokeless tobacco: Never Used  . Alcohol Use: No  . Drug Use: No  . Sexual Activity: No   Other Topics Concern  . Not on file   Social History Narrative  . No narrative on file       Review of Systems  Constitutional: Negative.   HENT: Negative.   Eyes: Negative.   Respiratory: Negative.   Cardiovascular: Negative.   Gastrointestinal: Negative.   Endocrine: Negative.   Genitourinary: Negative.   Musculoskeletal: Negative.   Skin: Negative.   Allergic/Immunologic: Negative.   Neurological: Negative.   Hematological: Negative.   Psychiatric/Behavioral: Negative.        Objective:   Physical Exam  Constitutional: She is oriented to person, place, and time. She appears well-developed and well-nourished. No distress.  HENT:  Head: Normocephalic and atraumatic.  Right Ear: External ear normal.  Left Ear: External ear normal.  Nose: Nose normal.  Eyes: Conjunctivae and EOM are  normal. Pupils are equal, round, and reactive to light.  Neck: Normal range of motion. Neck supple.  Cardiovascular: Normal rate, normal heart sounds and intact distal pulses.  Exam reveals no gallop and no friction rub.   No murmur heard. Pulmonary/Chest: Effort normal and breath sounds normal. No respiratory distress. She has no wheezes. She has no rales. She exhibits no tenderness.  Abdominal: Soft. Bowel sounds are normal.  Musculoskeletal: Normal range of motion.  Lymphadenopathy:    She has no cervical adenopathy.  Neurological: She is alert and oriented to person, place, and time. She has normal reflexes.  Skin: Skin is warm and dry. No rash noted. No erythema. No pallor.  Psychiatric: She has a normal mood and affect. Her behavior is normal. Judgment and thought content normal.    BP 130/66  Pulse 76  Temp(Src) 98.2 F (36.8 C) (Oral)  Resp 12  Ht 5' 4.25" (1.632 m)  Wt 168 lb (76.204 kg)  BMI 28.61 kg/m2  SpO2 96%       Assessment & Plan:

## 2013-04-11 NOTE — Assessment & Plan Note (Signed)
Uses ventolin inhalers as needed. Reports using this mostly during winter months.

## 2013-04-11 NOTE — Assessment & Plan Note (Addendum)
Patient has been checking her b/p regularly and they have been running > 150/80. She is currently on atenolol 50 mg bid. Check bmet. Will add lisinopril 10 mg daily

## 2013-04-11 NOTE — Assessment & Plan Note (Signed)
Currently on atorvastatin 40 mg daily. Check lipids

## 2013-04-13 ENCOUNTER — Other Ambulatory Visit (INDEPENDENT_AMBULATORY_CARE_PROVIDER_SITE_OTHER): Payer: Medicare Other

## 2013-04-13 DIAGNOSIS — E119 Type 2 diabetes mellitus without complications: Secondary | ICD-10-CM

## 2013-04-13 DIAGNOSIS — I10 Essential (primary) hypertension: Secondary | ICD-10-CM

## 2013-04-13 DIAGNOSIS — E785 Hyperlipidemia, unspecified: Secondary | ICD-10-CM

## 2013-04-13 LAB — COMPREHENSIVE METABOLIC PANEL
ALT: 11 U/L (ref 0–35)
AST: 17 U/L (ref 0–37)
Albumin: 4.1 g/dL (ref 3.5–5.2)
Alkaline Phosphatase: 97 U/L (ref 39–117)
BUN: 19 mg/dL (ref 6–23)
CO2: 28 mEq/L (ref 19–32)
Calcium: 9.3 mg/dL (ref 8.4–10.5)
Chloride: 103 mEq/L (ref 96–112)
Creatinine, Ser: 1 mg/dL (ref 0.4–1.2)
GFR: 59.8 mL/min — ABNORMAL LOW (ref >60.00)
Glucose, Bld: 174 mg/dL — ABNORMAL HIGH (ref 70–99)
Potassium: 4.4 mEq/L (ref 3.5–5.1)
Sodium: 141 mEq/L (ref 135–145)
Total Bilirubin: 0.6 mg/dL (ref 0.3–1.2)
Total Protein: 7.1 g/dL (ref 6.0–8.3)

## 2013-04-13 LAB — CBC WITH DIFFERENTIAL/PLATELET
Basophils Absolute: 0 10*3/uL (ref 0.0–0.1)
Basophils Relative: 0.5 % (ref 0.0–3.0)
Eosinophils Absolute: 0.6 10*3/uL (ref 0.0–0.7)
Eosinophils Relative: 6.5 % — ABNORMAL HIGH (ref 0.0–5.0)
HCT: 38.1 % (ref 36.0–46.0)
Hemoglobin: 13 g/dL (ref 12.0–15.0)
Lymphocytes Relative: 19.3 % (ref 12.0–46.0)
Lymphs Abs: 1.8 10*3/uL (ref 0.7–4.0)
MCHC: 34.2 g/dL (ref 30.0–36.0)
MCV: 90.5 fl (ref 78.0–100.0)
Monocytes Absolute: 0.6 10*3/uL (ref 0.1–1.0)
Monocytes Relative: 6.4 % (ref 3.0–12.0)
Neutro Abs: 6.3 10*3/uL (ref 1.4–7.7)
Neutrophils Relative %: 67.3 % (ref 43.0–77.0)
Platelets: 267 10*3/uL (ref 150.0–400.0)
RBC: 4.21 Mil/uL (ref 3.87–5.11)
RDW: 13.5 % (ref 11.5–14.6)
WBC: 9.4 10*3/uL (ref 4.5–10.5)

## 2013-04-13 LAB — LIPID PANEL
Cholesterol: 288 mg/dL — ABNORMAL HIGH (ref 0–200)
HDL: 50 mg/dL (ref >39.00)
Total CHOL/HDL Ratio: 6
Triglycerides: 304 mg/dL — ABNORMAL HIGH (ref 0.0–149.0)
VLDL: 60.8 mg/dL — ABNORMAL HIGH (ref 0.0–40.0)

## 2013-04-13 LAB — MICROALBUMIN / CREATININE URINE RATIO
Creatinine,U: 144.7 mg/dL
Microalb Creat Ratio: 1.2 mg/g (ref 0.0–30.0)
Microalb, Ur: 1.8 mg/dL (ref 0.0–1.9)

## 2013-04-13 LAB — HEMOGLOBIN A1C: Hgb A1c MFr Bld: 7.9 % — ABNORMAL HIGH (ref 4.6–6.5)

## 2013-04-14 ENCOUNTER — Encounter: Payer: Self-pay | Admitting: *Deleted

## 2013-04-14 LAB — LDL CHOLESTEROL, DIRECT: Direct LDL: 173.9 mg/dL

## 2013-04-17 ENCOUNTER — Telehealth: Payer: Self-pay | Admitting: Adult Health

## 2013-04-17 NOTE — Telephone Encounter (Signed)
Saw Raquel and was to have called in meloxicam.  States it has not been called in.  Also states her BP has been fluctuating bad and Raquel told her she may need another BP med.

## 2013-04-18 NOTE — Telephone Encounter (Signed)
Left message for pt to return my call.

## 2013-04-19 NOTE — Telephone Encounter (Signed)
Left message for pt to return my call.

## 2013-04-19 NOTE — Telephone Encounter (Signed)
Spoke with pt about labs, states she will discuss with Raquel the Meloxicam and changing blood pressure medications at her visit next week.

## 2013-04-26 ENCOUNTER — Encounter: Payer: Self-pay | Admitting: *Deleted

## 2013-04-27 ENCOUNTER — Ambulatory Visit (INDEPENDENT_AMBULATORY_CARE_PROVIDER_SITE_OTHER): Payer: Medicare Other | Admitting: Adult Health

## 2013-04-27 ENCOUNTER — Encounter: Payer: Self-pay | Admitting: Adult Health

## 2013-04-27 VITALS — BP 138/72 | HR 87 | Temp 98.6°F | Resp 16 | Wt 170.6 lb

## 2013-04-27 DIAGNOSIS — Z716 Tobacco abuse counseling: Secondary | ICD-10-CM | POA: Insufficient documentation

## 2013-04-27 DIAGNOSIS — M1990 Unspecified osteoarthritis, unspecified site: Secondary | ICD-10-CM | POA: Insufficient documentation

## 2013-04-27 DIAGNOSIS — Z7189 Other specified counseling: Secondary | ICD-10-CM

## 2013-04-27 DIAGNOSIS — E785 Hyperlipidemia, unspecified: Secondary | ICD-10-CM

## 2013-04-27 DIAGNOSIS — M129 Arthropathy, unspecified: Secondary | ICD-10-CM

## 2013-04-27 DIAGNOSIS — E1165 Type 2 diabetes mellitus with hyperglycemia: Secondary | ICD-10-CM

## 2013-04-27 DIAGNOSIS — IMO0002 Reserved for concepts with insufficient information to code with codable children: Secondary | ICD-10-CM | POA: Insufficient documentation

## 2013-04-27 DIAGNOSIS — IMO0001 Reserved for inherently not codable concepts without codable children: Secondary | ICD-10-CM

## 2013-04-27 DIAGNOSIS — Z23 Encounter for immunization: Secondary | ICD-10-CM

## 2013-04-27 DIAGNOSIS — E1139 Type 2 diabetes mellitus with other diabetic ophthalmic complication: Secondary | ICD-10-CM | POA: Insufficient documentation

## 2013-04-27 LAB — HM DIABETES FOOT EXAM: HM Diabetic Foot Exam: NORMAL

## 2013-04-27 MED ORDER — MELOXICAM 7.5 MG PO TABS: 7.5000 mg | ORAL_TABLET | Freq: Every day | ORAL | Status: DC

## 2013-04-27 MED ORDER — ATORVASTATIN CALCIUM 80 MG PO TABS: 80.0000 mg | ORAL_TABLET | Freq: Every day | ORAL | Status: DC

## 2013-04-27 MED ORDER — CANAGLIFLOZIN 100 MG PO TABS: 100.0000 mg | ORAL_TABLET | Freq: Every day | ORAL | Status: DC

## 2013-04-27 NOTE — Progress Notes (Signed)
  Subjective:    Patient ID: Maria Hinton, female    DOB: 16-Nov-1945, 67 y.o.   MRN: [id number]  HPI  Patient is a pleasant 67 year old female who presents to clinic for followup diabetes and hyperlipidemia. She was recently seen in office and had labs drawn. Hemoglobin A1c is 7.9%. Patient is currently on metformin 500 mg twice a day and glipizide 5 mg twice a day. Her cholesterol was also noted as being elevated. She has been on Lipitor 40 mg daily. Patient reports that she recently had surgery and has not been able to exercise. She has not been watching her diet. Patient reports that she is feeling well. She is having pain related to her arthritis. She reports that she had taken Celebrex in the past but this significantly upset her stomach. She has taken meloxicam with good results. She is requesting a refill on her meloxicam.  Current Outpatient Prescriptions on File Prior to Visit  Medication Sig Dispense Refill  . albuterol (PROVENTIL HFA;VENTOLIN HFA) 108 (90 BASE) MCG/ACT inhaler Inhale 2 puffs into the lungs every 6 (six) hours as needed for wheezing.      Marland Kitchen ALPRAZolam (XANAX) 0.25 MG tablet Take 1 tablet (0.25 mg total) by mouth as needed for sleep.  30 tablet  0  . atenolol (TENORMIN) 50 MG tablet Take 1 tablet (50 mg total) by mouth 2 (two) times daily.  180 tablet  3  . Ferrous Sulfate (IRON) 325 (65 FE) MG TABS Take by mouth.      Marland Kitchen glipiZIDE (GLUCOTROL) 5 MG tablet Take 1 tablet (5 mg total) by mouth 2 (two) times daily before a meal.  180 tablet  3  . metFORMIN (GLUCOPHAGE) 500 MG tablet Take 1 tablet (500 mg total) by mouth 2 (two) times daily with a meal.  180 tablet  3  . omeprazole (PRILOSEC) 20 MG capsule Take 1 capsule (20 mg total) by mouth daily.  90 capsule  3  . glyBURIDE-metformin (GLUCOVANCE) 5-500 MG per tablet Take 2 tablets by mouth 2 (two) times daily with a meal.       No current facility-administered medications on file prior to visit.     Review of Systems   Constitutional: Negative.   Respiratory: Negative.   Cardiovascular: Negative.   Gastrointestinal: Negative.   Genitourinary: Negative.   Musculoskeletal: Positive for arthralgias.  Neurological: Negative.   Psychiatric/Behavioral: Negative.   All other systems reviewed and are negative.       Objective:   Physical Exam  Constitutional: She is oriented to person, place, and time. She appears well-developed and well-nourished. No distress.  Cardiovascular: Normal rate, regular rhythm, normal heart sounds and intact distal pulses.  Exam reveals no gallop and no friction rub.   No murmur heard. Pulmonary/Chest: Effort normal and breath sounds normal. No respiratory distress. She has no wheezes. She has no rales.  Abdominal: Soft. Bowel sounds are normal.  Musculoskeletal: Normal range of motion. She exhibits no edema.  Neurological: She is alert and oriented to person, place, and time.  Skin: Skin is warm and dry.  Psychiatric: She has a normal mood and affect. Her behavior is normal. Judgment and thought content normal.          Assessment & Plan:

## 2013-04-27 NOTE — Assessment & Plan Note (Signed)
Patient reports that she is down to one half a pack per day. She continues to strive towards quitting. Encouraged her to do so and will continue to encourage at each visit

## 2013-04-27 NOTE — Assessment & Plan Note (Signed)
Increase Lipitor to 80 mg daily. 3 month followup with lipid recheck. Discussed importance of exercising and following appropriate diet.

## 2013-04-27 NOTE — Assessment & Plan Note (Signed)
Hemoglobin A1c 7.9%. Patient has not been dieting properly or exercising. Refer to diabetic education at [facility]. Add Invocana 100 mg daily. Samples provided. Followup visit with hemoglobin A1c in 3 months. Provided patient with new glucometer.

## 2013-04-27 NOTE — Patient Instructions (Signed)
  Your cholesterol and hemoglobin A1c are elevated.  I am starting you on Invokana 100 mg daily before breakfast to help lower your hemoglobin A1c.  Increase your Lipitor to 80 mg daily. I have sent in new prescription to your pharmacy.  [facility] offers diabetic education. They will contact you with an appointment for the classes.  Exercise regularly at least 30 minutes daily. Please follow healthy diet - diabetic and low-cholesterol, low-fat.  Return for followup in 3 months. Recheck hemoglobin A1c and cholesterol.  Continue to aim towards being smoke-free. This is critical to your health.

## 2013-04-27 NOTE — Assessment & Plan Note (Addendum)
Patient has had Celebrex in the past but this had caused stomach upset. She reports she was then switched to meloxicam and did well with this medication. She is requesting a refill. Discussed side effects including that smoking may place her at higher risk of strokes. Patient voiced understanding and reports that she is trying to quit smoking. She still wants meloxicam refill.

## 2013-05-16 ENCOUNTER — Encounter: Payer: Self-pay | Admitting: Adult Health

## 2013-05-26 ENCOUNTER — Other Ambulatory Visit: Payer: Self-pay | Admitting: *Deleted

## 2013-05-26 MED ORDER — GLUCOSE BLOOD VI STRP: ORAL_STRIP | Status: DC

## 2013-05-26 MED ORDER — ONETOUCH ULTRA SYSTEM W/DEVICE KIT: PACK | Status: DC

## 2013-05-26 MED ORDER — ONETOUCH ULTRASOFT LANCETS MISC: Status: DC

## 2013-05-26 NOTE — Telephone Encounter (Signed)
Pharmacy fax: "Insurance only wants to cover [employer] test strip. Can you please send in a new Rx with the diagnosis code on toe Rx and she also needs a meter, strips, and lancets."

## 2013-07-28 ENCOUNTER — Ambulatory Visit (INDEPENDENT_AMBULATORY_CARE_PROVIDER_SITE_OTHER): Payer: Medicare Other | Admitting: Adult Health

## 2013-07-28 ENCOUNTER — Encounter: Payer: Self-pay | Admitting: Adult Health

## 2013-07-28 VITALS — BP 142/80 | HR 72 | Resp 12 | Wt 178.0 lb

## 2013-07-28 DIAGNOSIS — E1165 Type 2 diabetes mellitus with hyperglycemia: Secondary | ICD-10-CM

## 2013-07-28 DIAGNOSIS — R238 Other skin changes: Secondary | ICD-10-CM | POA: Insufficient documentation

## 2013-07-28 DIAGNOSIS — IMO0002 Reserved for concepts with insufficient information to code with codable children: Secondary | ICD-10-CM

## 2013-07-28 DIAGNOSIS — M25512 Pain in left shoulder: Secondary | ICD-10-CM

## 2013-07-28 DIAGNOSIS — IMO0001 Reserved for inherently not codable concepts without codable children: Secondary | ICD-10-CM

## 2013-07-28 DIAGNOSIS — L988 Other specified disorders of the skin and subcutaneous tissue: Secondary | ICD-10-CM

## 2013-07-28 DIAGNOSIS — E785 Hyperlipidemia, unspecified: Secondary | ICD-10-CM

## 2013-07-28 DIAGNOSIS — M25519 Pain in unspecified shoulder: Secondary | ICD-10-CM

## 2013-07-28 DIAGNOSIS — Z716 Tobacco abuse counseling: Secondary | ICD-10-CM

## 2013-07-28 DIAGNOSIS — Z7189 Other specified counseling: Secondary | ICD-10-CM

## 2013-07-28 LAB — LIPID PANEL
Cholesterol: 205 mg/dL — ABNORMAL HIGH (ref 0–200)
HDL: 44.3 mg/dL (ref >39.00)
Total CHOL/HDL Ratio: 5
Triglycerides: 385 mg/dL — ABNORMAL HIGH (ref 0.0–149.0)
VLDL: 77 mg/dL — ABNORMAL HIGH (ref 0.0–40.0)

## 2013-07-28 LAB — HEMOGLOBIN A1C: Hgb A1c MFr Bld: 9.6 % — ABNORMAL HIGH (ref 4.6–6.5)

## 2013-07-28 LAB — LDL CHOLESTEROL, DIRECT: Direct LDL: 106.8 mg/dL

## 2013-07-28 MED ORDER — METFORMIN HCL 1000 MG PO TABS: 1000.0000 mg | ORAL_TABLET | Freq: Two times a day (BID) | ORAL | Status: DC

## 2013-07-28 NOTE — Assessment & Plan Note (Addendum)
Check lipids. Continue Lipitor 80 mg in the evening. Followup in 3 months

## 2013-07-28 NOTE — Assessment & Plan Note (Signed)
Last hemoglobin A1c was 7.9%. Check levels again today. Encourage diet, exercise. Diabetic education at [facility] beginning on 08/03/2013. She is also joining [employer] to begin an exercise program. Followup in 3 months

## 2013-07-28 NOTE — Patient Instructions (Addendum)
  I sent in a prescription for Metformin 1000 twice daily with breakfast and dinner.  Continue all other medications as you have been doing.  Have your labs drawn today.  Recommend smoking cessation. Continue to decrease amount until you complete quit.  For your left shoulder - avoid carrying your heavy purse on that shoulder   You can take tylenol for discomfort  Apply ice to the area 3-4 times a day for 1 week  Rest the arm - don't lift with that arm, no purse on that arm, etc.  Call if no improvement in your symptoms in 2 weeks.  Congratulations on your 4th great granchild

## 2013-07-28 NOTE — Assessment & Plan Note (Signed)
White, symmetrical papules on the scalp. She is followed at [facility]. Recommend setting up appointment for evaluation. Does not appear malignant but patient does have history of basal cell carcinoma. Patient reports she will call and set up appointment.

## 2013-07-28 NOTE — Progress Notes (Signed)
Subjective:    Patient ID: Maria Hinton, female    DOB: March 10, 1946, 68 y.o.   MRN: [id number]  HPI [name] is a pleasant 68 y/o female with history of diabetes uncontrolled, hyperlipidemia, ongoing tobacco abuse who presents to clinic for follow up DM, HLD.  1) Pertaining to her DM - She will begin diabetic education at [facility] on 08/03/13. She has improved her diet since the first of the year. She is joining [employer] and going to begin a work out. She is taking medications as prescribed. I have given her samples of Invokana which she reports tolerating but she states it would cost her $60 to get that prescription.  2) Pertaining to her HLD - As mentioned above, she is planning on beginning an exercise program. She reports taking medication as prescribed.  3) Tobacco Abuse - has decreased to one half packs per day. Her goal is to continue to decrease until she quits  4) Left shoulder pain that has been bothering her for ~ 3 weeks. Does not recall injury. ROM decreased. No tingling or numbness. Dull ache  5) Scalp with few areas that she would like examined. She reports that she is followed at [facility] and wondering if she should be evaluated there.   Past Medical History  Diagnosis Date  . PONV (postoperative nausea and vomiting)   . Hypertension     takes Metoprolol/HCTZ daily  . Hyperlipidemia     takes Pravastatin daily  . Asthma   . Pneumonia     x 2 ;in Dec 2010/2011  . Arthritis   . Joint swelling   . Joint pain     fingers  . Chronic back pain     DDD,herniated disc/spondylosis/radiculopathy  . GERD (gastroesophageal reflux disease)     takes Omeprazole daily  . Hemorrhoids   . History of colonic polyps     colonoscopy 04/2012 - Dr Candace Cruise  . Diabetes mellitus   . Early cataracts, bilateral     Dr. Ruthine Dose - [facility]  . Depression     after death of husband;takes Ativan prn    Current Outpatient Prescriptions on File Prior to  Visit  Medication Sig Dispense Refill  . albuterol (PROVENTIL HFA;VENTOLIN HFA) 108 (90 BASE) MCG/ACT inhaler Inhale 2 puffs into the lungs every 6 (six) hours as needed for wheezing.      Marland Kitchen ALPRAZolam (XANAX) 0.25 MG tablet Take 1 tablet (0.25 mg total) by mouth as needed for sleep.  30 tablet  0  . atenolol (TENORMIN) 50 MG tablet Take 1 tablet (50 mg total) by mouth 2 (two) times daily.  180 tablet  3  . atorvastatin (LIPITOR) 80 MG tablet Take 1 tablet (80 mg total) by mouth daily.  30 tablet  6  . Blood Glucose Monitoring Suppl (ONE TOUCH ULTRA SYSTEM KIT) W/DEVICE KIT Pt needs OneTouch Ultra Blue glucometer dx 250.02  1 each  0  . glipiZIDE (GLUCOTROL) 5 MG tablet Take 1 tablet (5 mg total) by mouth 2 (two) times daily before a meal.  180 tablet  3  . glucose blood (ONE TOUCH ULTRA TEST) test strip Use as instructed bid, has OneTouch Ultra Blue  dx: 250.02  100 each  3  . Lancets (ONETOUCH ULTRASOFT) lancets Use as instructed bid dx 250.02  100 each  3  . meloxicam (MOBIC) 7.5 MG tablet Take 1 tablet (7.5 mg total) by mouth daily.  30 tablet  6  .  omeprazole (PRILOSEC) 20 MG capsule Take 1 capsule (20 mg total) by mouth daily.  90 capsule  3  . Canagliflozin 100 MG TABS Take 1 tablet (100 mg total) by mouth daily.  30 tablet  0   No current facility-administered medications on file prior to visit.     Review of Systems  Constitutional: Negative.   HENT: Negative.   Eyes: Negative.   Respiratory: Negative.   Cardiovascular: Negative.   Gastrointestinal: Negative.   Endocrine: Negative.   Genitourinary: Negative.   Musculoskeletal:       Left shoulder pain  Skin:       Skin growths on scalp  Allergic/Immunologic: Negative.   Neurological: Negative.   Hematological: Negative.   Psychiatric/Behavioral: Negative.        Objective:   Physical Exam  Constitutional: She is oriented to person, place, and time. She appears well-developed and well-nourished. No distress.  HENT:    Head: Normocephalic and atraumatic.  Nose: Nose normal.  Eyes: Conjunctivae and EOM are normal. Pupils are equal, round, and reactive to light.  Neck: Normal range of motion. Neck supple. No tracheal deviation present.  Cardiovascular: Normal rate, regular rhythm, normal heart sounds and intact distal pulses.  Exam reveals no gallop and no friction rub.   No murmur heard. Pulmonary/Chest: Effort normal and breath sounds normal. No respiratory distress. She has no wheezes. She has no rales.  Abdominal: Soft. Bowel sounds are normal.  Musculoskeletal: Normal range of motion. She exhibits tenderness. She exhibits no edema.  Left shoulder decreased ROM. Carries heavy purse on that side  Lymphadenopathy:    She has no cervical adenopathy.  Neurological: She is alert and oriented to person, place, and time. She has normal reflexes. No cranial nerve deficit. Coordination normal.  Skin: Skin is warm and dry.  Small white papules on scalp  Psychiatric: She has a normal mood and affect. Her behavior is normal. Judgment and thought content normal.        Assessment & Plan:

## 2013-07-28 NOTE — Assessment & Plan Note (Signed)
Suspect coming from carrying heavy purse on the left shoulder. Instructed to carry a smaller person and to avoid carrying anything on the left shoulder for several weeks. Apply ice 3-4 times a day for the next week. Take Tylenol for discomfort. If no improvement within 2 weeks refer to orthopedic.

## 2013-07-28 NOTE — Assessment & Plan Note (Signed)
Continue to encourage smoking cessation. Patient has decreased smoking to one half pack per day.

## 2013-07-28 NOTE — Progress Notes (Signed)
Pre visit review using our clinic review tool, if applicable. No additional management support is needed unless otherwise documented below in the visit note. 

## 2013-07-30 ENCOUNTER — Other Ambulatory Visit: Payer: Self-pay | Admitting: Adult Health

## 2013-07-30 ENCOUNTER — Encounter: Payer: Self-pay | Admitting: Adult Health

## 2013-07-30 MED ORDER — GLIPIZIDE 5 MG PO TABS: ORAL_TABLET | ORAL | Status: DC

## 2013-07-31 ENCOUNTER — Telehealth: Payer: Self-pay | Admitting: Adult Health

## 2013-07-31 NOTE — Telephone Encounter (Signed)
Relevant patient education assigned to patient using Emmi. ° °

## 2013-08-01 ENCOUNTER — Telehealth: Payer: Self-pay

## 2013-08-01 NOTE — Telephone Encounter (Signed)
Relevant patient education assigned to patient using Emmi. ° °

## 2013-08-01 NOTE — Telephone Encounter (Signed)
Mailed unread message to pt  

## 2013-08-04 ENCOUNTER — Telehealth: Payer: Self-pay | Admitting: Adult Health

## 2013-08-04 ENCOUNTER — Encounter: Payer: Self-pay | Admitting: Emergency Medicine

## 2013-08-04 NOTE — Telephone Encounter (Signed)
Pt send my chart message wanting to get her mammogram scheduled

## 2013-08-09 ENCOUNTER — Ambulatory Visit: Payer: Self-pay | Admitting: Adult Health

## 2013-08-20 LAB — HM MAMMOGRAPHY: HM Mammogram: NORMAL

## 2013-08-29 ENCOUNTER — Encounter: Payer: Self-pay | Admitting: Adult Health

## 2013-09-11 ENCOUNTER — Other Ambulatory Visit: Payer: Self-pay | Admitting: *Deleted

## 2013-09-11 MED ORDER — ALPRAZOLAM 0.25 MG PO TABS: 0.2500 mg | ORAL_TABLET | ORAL | Status: DC | PRN

## 2013-09-11 NOTE — Telephone Encounter (Signed)
Last visit 07/28/13, ok refill?

## 2013-09-12 NOTE — Telephone Encounter (Signed)
Rx phoned to pharmacy.  

## 2013-10-26 ENCOUNTER — Ambulatory Visit (INDEPENDENT_AMBULATORY_CARE_PROVIDER_SITE_OTHER): Payer: Medicare Other | Admitting: Adult Health

## 2013-10-26 ENCOUNTER — Encounter: Payer: Self-pay | Admitting: Adult Health

## 2013-10-26 ENCOUNTER — Other Ambulatory Visit: Payer: Self-pay | Admitting: *Deleted

## 2013-10-26 VITALS — BP 130/80 | HR 71 | Temp 98.5°F | Resp 14 | Wt 180.0 lb

## 2013-10-26 DIAGNOSIS — Z7189 Other specified counseling: Secondary | ICD-10-CM

## 2013-10-26 DIAGNOSIS — IMO0002 Reserved for concepts with insufficient information to code with codable children: Secondary | ICD-10-CM

## 2013-10-26 DIAGNOSIS — Z716 Tobacco abuse counseling: Secondary | ICD-10-CM

## 2013-10-26 DIAGNOSIS — I10 Essential (primary) hypertension: Secondary | ICD-10-CM

## 2013-10-26 DIAGNOSIS — E785 Hyperlipidemia, unspecified: Secondary | ICD-10-CM

## 2013-10-26 DIAGNOSIS — E1165 Type 2 diabetes mellitus with hyperglycemia: Secondary | ICD-10-CM

## 2013-10-26 DIAGNOSIS — IMO0001 Reserved for inherently not codable concepts without codable children: Secondary | ICD-10-CM

## 2013-10-26 DIAGNOSIS — F17200 Nicotine dependence, unspecified, uncomplicated: Secondary | ICD-10-CM

## 2013-10-26 LAB — HEPATIC FUNCTION PANEL
ALT: 20 U/L (ref 0–35)
AST: 17 U/L (ref 0–37)
Albumin: 3.7 g/dL (ref 3.5–5.2)
Alkaline Phosphatase: 114 U/L (ref 39–117)
Bilirubin, Direct: 0.1 mg/dL (ref 0.0–0.3)
Total Bilirubin: 0.7 mg/dL (ref 0.3–1.2)
Total Protein: 6.6 g/dL (ref 6.0–8.3)

## 2013-10-26 LAB — BASIC METABOLIC PANEL
BUN: 16 mg/dL (ref 6–23)
CO2: 28 mEq/L (ref 19–32)
Calcium: 8.9 mg/dL (ref 8.4–10.5)
Chloride: 105 mEq/L (ref 96–112)
Creatinine, Ser: 1 mg/dL (ref 0.4–1.2)
GFR: 57.68 mL/min — ABNORMAL LOW (ref >60.00)
Glucose, Bld: 205 mg/dL — ABNORMAL HIGH (ref 70–99)
Potassium: 4.2 mEq/L (ref 3.5–5.1)
Sodium: 142 mEq/L (ref 135–145)

## 2013-10-26 LAB — LIPID PANEL
Cholesterol: 143 mg/dL (ref 0–200)
HDL: 41.6 mg/dL (ref >39.00)
LDL Cholesterol: 65 mg/dL (ref 0–99)
Total CHOL/HDL Ratio: 3
Triglycerides: 184 mg/dL — ABNORMAL HIGH (ref 0.0–149.0)
VLDL: 36.8 mg/dL (ref 0.0–40.0)

## 2013-10-26 LAB — HEMOGLOBIN A1C: Hgb A1c MFr Bld: 9.3 % — ABNORMAL HIGH (ref 4.6–6.5)

## 2013-10-26 MED ORDER — ALPRAZOLAM 0.25 MG PO TABS: 0.2500 mg | ORAL_TABLET | ORAL | Status: DC | PRN

## 2013-10-26 MED ORDER — INSULIN DETEMIR 100 UNIT/ML FLEXPEN: 10.0000 [IU] | PEN_INJECTOR | Freq: Every day | SUBCUTANEOUS | Status: DC

## 2013-10-26 NOTE — Progress Notes (Signed)
Pre visit review using our clinic review tool, if applicable. No additional management support is needed unless otherwise documented below in the visit note. 

## 2013-10-26 NOTE — Progress Notes (Signed)
Subjective:    Patient ID: Maria Hinton, female    DOB: 08-16-1945, 68 y.o.   MRN: [id number]  HPI Pt is a 68 y/o female with multiple medical problems as listed below who presents to clinic for follow up DM, HTN, HLD, ongoing tobacco abuse. Pt reports that her blood pressure has been well controlled. She is compliant with her meds and she denies any side effects. She has not been eating as good as she should. She is currently not exercising. Her blood glucose levels have not been well controlled. She is not checking this on a regular basis but reports that when she does check it is ~ 250.   Alie is smoking ~ 1 ppd. She is contemplating quitting. She has set a date of May 1 to stop. Her son and daughter-in-law are planning on quitting together.   Past Medical History  Diagnosis Date  . PONV (postoperative nausea and vomiting)   . Hypertension     takes Metoprolol/HCTZ daily  . Hyperlipidemia     takes Pravastatin daily  . Asthma   . Pneumonia     x 2 ;in Dec 2010/2011  . Arthritis   . Joint swelling   . Joint pain     fingers  . Chronic back pain     DDD,herniated disc/spondylosis/radiculopathy  . GERD (gastroesophageal reflux disease)     takes Omeprazole daily  . Hemorrhoids   . History of colonic polyps     colonoscopy 04/2012 - Dr Candace Cruise  . Diabetes mellitus   . Early cataracts, bilateral     Dr. Ruthine Dose - [facility]  . Depression     after death of husband;takes Ativan prn   Current Outpatient Prescriptions on File Prior to Visit  Medication Sig Dispense Refill  . albuterol (PROVENTIL HFA;VENTOLIN HFA) 108 (90 BASE) MCG/ACT inhaler Inhale 2 puffs into the lungs every 6 (six) hours as needed for wheezing.      Marland Kitchen atenolol (TENORMIN) 50 MG tablet Take 1 tablet (50 mg total) by mouth 2 (two) times daily.  180 tablet  3  . atorvastatin (LIPITOR) 80 MG tablet Take 1 tablet (80 mg total) by mouth daily.  30 tablet  6  . Blood Glucose Monitoring Suppl (ONE TOUCH  ULTRA SYSTEM KIT) W/DEVICE KIT Pt needs OneTouch Ultra Blue glucometer dx 250.02  1 each  0  . Canagliflozin 100 MG TABS Take 1 tablet (100 mg total) by mouth daily.  30 tablet  0  . glipiZIDE (GLUCOTROL) 5 MG tablet Take 2 tablets at breakfast and 2 tablets at dinner  120 tablet  0  . glucose blood (ONE TOUCH ULTRA TEST) test strip Use as instructed bid, has OneTouch Ultra Blue  dx: 250.02  100 each  3  . Lancets (ONETOUCH ULTRASOFT) lancets Use as instructed bid dx 250.02  100 each  3  . meloxicam (MOBIC) 7.5 MG tablet Take 1 tablet (7.5 mg total) by mouth daily.  30 tablet  6  . metFORMIN (GLUCOPHAGE) 1000 MG tablet Take 1 tablet (1,000 mg total) by mouth 2 (two) times daily with a meal.  180 tablet  3  . omeprazole (PRILOSEC) 20 MG capsule Take 1 capsule (20 mg total) by mouth daily.  90 capsule  3   No current facility-administered medications on file prior to visit.     Review of Systems  Constitutional: Negative.   HENT: Negative.   Eyes: Negative.   Respiratory: Negative.   Cardiovascular: Negative.  Gastrointestinal: Negative.   Endocrine: Negative.   Genitourinary: Negative.   Musculoskeletal: Negative.   Skin: Negative.   Allergic/Immunologic: Negative.   Neurological: Negative.   Hematological: Negative.   Psychiatric/Behavioral: Negative.        Objective:   Physical Exam  Constitutional: She is oriented to person, place, and time. No distress.  HENT:  Head: Normocephalic and atraumatic.  Eyes: Conjunctivae and EOM are normal.  Neck: Normal range of motion. Neck supple.  Cardiovascular: Normal rate, regular rhythm, normal heart sounds and intact distal pulses.  Exam reveals no gallop and no friction rub.   No murmur heard. Pulmonary/Chest: Effort normal and breath sounds normal. No respiratory distress. She has no wheezes. She has no rales.  Musculoskeletal: Normal range of motion.  Neurological: She is alert and oriented to person, place, and time. She has  normal reflexes. Coordination normal.  Skin: Skin is warm and dry.  Psychiatric: She has a normal mood and affect. Her behavior is normal. Judgment and thought content normal.      Assessment & Plan:   1. HLD (hyperlipidemia) On medication. Check labs. Continue to follow. - Lipid panel - Hepatic function panel  2. HTN (hypertension) Well controlled on medication. Check labs. Continue to follow - Basic metabolic panel  3. Diabetes mellitus type II, uncontrolled Not controlled. Check A1c and adjust meds as needed. Encouraged pt to increase physical activity and watch diet. - Hemoglobin A1c  4. Tobacco abuse counseling Encouraged smoking cessation. She has set Nov 03, 2013 as the day to quit smoking

## 2013-10-26 NOTE — Telephone Encounter (Signed)
Okay to refill per R.Rey

## 2013-10-26 NOTE — Patient Instructions (Signed)
  Please have your labs drawn prior to leaving the office.  Start Levemir 10 units injected subcutaneous into the skin in the evening.  Send me your blood glucose morning readings after 1 week of using Levemir so that we can adjust your dose.  Watch your diet. Avoid concentrated sweets or very high carbohydrate diet. Lean protein. Grill, bake over frying.  I recommend that you quit smoking. Please let me know if you need assistance with quitting.

## 2013-10-27 ENCOUNTER — Telehealth: Payer: Self-pay | Admitting: Adult Health

## 2013-10-27 NOTE — Telephone Encounter (Signed)
Relevant patient education assigned to patient using Emmi. ° °

## 2013-10-29 ENCOUNTER — Encounter: Payer: Self-pay | Admitting: Adult Health

## 2013-11-28 ENCOUNTER — Ambulatory Visit (INDEPENDENT_AMBULATORY_CARE_PROVIDER_SITE_OTHER): Payer: Medicare Other | Admitting: Adult Health

## 2013-11-28 ENCOUNTER — Encounter: Payer: Self-pay | Admitting: Adult Health

## 2013-11-28 VITALS — BP 162/80 | HR 66 | Resp 16 | Wt 182.0 lb

## 2013-11-28 DIAGNOSIS — E1165 Type 2 diabetes mellitus with hyperglycemia: Secondary | ICD-10-CM

## 2013-11-28 DIAGNOSIS — R5383 Other fatigue: Secondary | ICD-10-CM | POA: Insufficient documentation

## 2013-11-28 DIAGNOSIS — R5381 Other malaise: Secondary | ICD-10-CM

## 2013-11-28 DIAGNOSIS — R600 Localized edema: Secondary | ICD-10-CM

## 2013-11-28 DIAGNOSIS — IMO0001 Reserved for inherently not codable concepts without codable children: Secondary | ICD-10-CM

## 2013-11-28 DIAGNOSIS — IMO0002 Reserved for concepts with insufficient information to code with codable children: Secondary | ICD-10-CM

## 2013-11-28 DIAGNOSIS — R609 Edema, unspecified: Secondary | ICD-10-CM

## 2013-11-28 LAB — CBC WITH DIFFERENTIAL/PLATELET
Basophils Absolute: 0 10*3/uL (ref 0.0–0.1)
Basophils Relative: 0.5 % (ref 0.0–3.0)
Eosinophils Absolute: 0.7 10*3/uL (ref 0.0–0.7)
Eosinophils Relative: 8.3 % — ABNORMAL HIGH (ref 0.0–5.0)
HCT: 38.2 % (ref 36.0–46.0)
Hemoglobin: 12.9 g/dL (ref 12.0–15.0)
Lymphocytes Relative: 20.2 % (ref 12.0–46.0)
Lymphs Abs: 1.7 10*3/uL (ref 0.7–4.0)
MCHC: 33.8 g/dL (ref 30.0–36.0)
MCV: 94 fl (ref 78.0–100.0)
Monocytes Absolute: 0.5 10*3/uL (ref 0.1–1.0)
Monocytes Relative: 5.5 % (ref 3.0–12.0)
Neutro Abs: 5.4 10*3/uL (ref 1.4–7.7)
Neutrophils Relative %: 65.5 % (ref 43.0–77.0)
Platelets: 253 10*3/uL (ref 150.0–400.0)
RBC: 4.06 Mil/uL (ref 3.87–5.11)
RDW: 12.7 % (ref 11.5–15.5)
WBC: 8.2 10*3/uL (ref 4.0–10.5)

## 2013-11-28 LAB — TSH: TSH: 1.31 u[IU]/mL (ref 0.35–4.50)

## 2013-11-28 LAB — HEMOGLOBIN A1C: Hgb A1c MFr Bld: 8.6 % — ABNORMAL HIGH (ref 4.6–6.5)

## 2013-11-28 MED ORDER — HYDROCHLOROTHIAZIDE 12.5 MG PO TABS: 12.5000 mg | ORAL_TABLET | Freq: Every day | ORAL | Status: DC

## 2013-11-28 NOTE — Progress Notes (Signed)
Patient ID: Maria Hinton, female   DOB: 1945-12-28, 68 y.o.   MRN: [id number]   Subjective:    Patient ID: Maria Hinton, female    DOB: 1945/10/03, 68 y.o.   MRN: [id number]  HPI  Pt is a pleasant 68 y/o female who presents to clinic for 1 month f/u of uncontrolled diabetes. She was injecting levemir 10 units in the evening. I increased her frequency to bid. She denies any hypoglycemic events. Her blood glucose readings in the AM have been better controlled with readings between 85 - 150. She has been checking her BG 2 hours after the evening meals and these are running between 150 - 250.   She has been feeling fatigued. Reports that she easily tires. Mild shortness of breath with exertion; none at rest. She has also been experiencing lower extremity edema. Edema has been ongoing for a couple of weeks. She reports edema worsens by the end of the day but she will begin to notice the edema within a couple of hours of getting up. Denies chest pain.  Has decreased smoking to 5 cigarettes daily. She reports that previously she quit smoking by gradually decreasing the amount. She is planning on continuing this method until she quits.   Past Medical History  Diagnosis Date  . PONV (postoperative nausea and vomiting)   . Hypertension     takes Metoprolol/HCTZ daily  . Hyperlipidemia     takes Pravastatin daily  . Asthma   . Pneumonia     x 2 ;in Dec 2010/2011  . Arthritis   . Joint swelling   . Joint pain     fingers  . Chronic back pain     DDD,herniated disc/spondylosis/radiculopathy  . GERD (gastroesophageal reflux disease)     takes Omeprazole daily  . Hemorrhoids   . History of colonic polyps     colonoscopy 04/2012 - Dr Candace Cruise  . Diabetes mellitus   . Early cataracts, bilateral     Dr. Ruthine Dose - [facility]  . Depression     after death of husband;takes Ativan prn    Current Outpatient Prescriptions on File Prior to Visit  Medication Sig Dispense Refill  . albuterol  (PROVENTIL HFA;VENTOLIN HFA) 108 (90 BASE) MCG/ACT inhaler Inhale 2 puffs into the lungs every 6 (six) hours as needed for wheezing.      Marland Kitchen ALPRAZolam (XANAX) 0.25 MG tablet Take 1 tablet (0.25 mg total) by mouth as needed for sleep.  30 tablet  5  . atenolol (TENORMIN) 50 MG tablet Take 1 tablet (50 mg total) by mouth 2 (two) times daily.  180 tablet  3  . atorvastatin (LIPITOR) 80 MG tablet Take 1 tablet (80 mg total) by mouth daily.  30 tablet  6  . Blood Glucose Monitoring Suppl (ONE TOUCH ULTRA SYSTEM KIT) W/DEVICE KIT Pt needs OneTouch Ultra Blue glucometer dx 250.02  1 each  0  . Canagliflozin 100 MG TABS Take 1 tablet (100 mg total) by mouth daily.  30 tablet  0  . glipiZIDE (GLUCOTROL) 5 MG tablet Take 2 tablets at breakfast and 2 tablets at dinner  120 tablet  0  . glucose blood (ONE TOUCH ULTRA TEST) test strip Use as instructed bid, has OneTouch Ultra Blue  dx: 250.02  100 each  3  . Insulin Detemir (LEVEMIR) 100 UNIT/ML Pen Inject 10 Units into the skin daily at 10 pm.  3 pen  11  . Lancets (ONETOUCH ULTRASOFT) lancets Use as  instructed bid dx 250.02  100 each  3  . meloxicam (MOBIC) 7.5 MG tablet Take 1 tablet (7.5 mg total) by mouth daily.  30 tablet  6  . metFORMIN (GLUCOPHAGE) 1000 MG tablet Take 1 tablet (1,000 mg total) by mouth 2 (two) times daily with a meal.  180 tablet  3  . omeprazole (PRILOSEC) 20 MG capsule Take 1 capsule (20 mg total) by mouth daily.  90 capsule  3   No current facility-administered medications on file prior to visit.     Review of Systems  Constitutional: Positive for fatigue (tires easily).  HENT: Negative.   Eyes: Negative.   Respiratory: Positive for shortness of breath (mild sob on exertion).   Cardiovascular: Positive for leg swelling.  Gastrointestinal: Negative.   Endocrine: Negative.   Genitourinary: Negative.   Musculoskeletal: Negative.   Skin: Negative.   Allergic/Immunologic: Negative.   Neurological: Negative.   Hematological:  Negative.   Psychiatric/Behavioral: Negative.        Objective:  There were no vitals taken for this visit.   Physical Exam  Constitutional: She is oriented to person, place, and time. No distress.  HENT:  Head: Normocephalic and atraumatic.  Eyes: Conjunctivae and EOM are normal.  Neck: Normal range of motion. Neck supple.  Cardiovascular: Normal rate, regular rhythm, normal heart sounds and intact distal pulses.  Exam reveals no gallop and no friction rub.   No murmur heard. Pulmonary/Chest: Effort normal. No respiratory distress. She has wheezes (faint exp wheezing left posterior apex). She has no rales.  Musculoskeletal: Normal range of motion. She exhibits edema.  Bilateral LE trace edema  Neurological: She is alert and oriented to person, place, and time. She has normal reflexes. Coordination normal.  Skin: Skin is warm and dry.  Psychiatric: She has a normal mood and affect. Her behavior is normal. Judgment and thought content normal.      Assessment & Plan:   1. DM (diabetes mellitus), type 2, uncontrolled Blood glucose levels better controlled since increasing frequency of Levemir to bid. She is taking 10 units in the AM and 10 units in the PM. Check A1c - Hemoglobin A1c  2. Fatigue Check labs. She is also experiencing some lower extremity swelling which is a new finding.  - TSH - CBC with Differential  3. Lower extremity edema Ordering an echo. She reports she has not had one done in years.

## 2013-11-28 NOTE — Patient Instructions (Signed)
  Please have blood work drawn prior to leaving.  I will notify you of your results along with any further instructions.  I am adding hydrochlorothiazide 12.5 mg daily. This is a fluid pill to help with the swelling.  I am also ordering an echocardiogram. Please call the office if you have not heard back from [place] by Monday midday.

## 2013-11-28 NOTE — Progress Notes (Signed)
Pre visit review using our clinic review tool, if applicable. No additional management support is needed unless otherwise documented below in the visit note. 

## 2013-12-05 LAB — HM DIABETES EYE EXAM

## 2013-12-11 ENCOUNTER — Other Ambulatory Visit: Payer: Self-pay

## 2013-12-11 ENCOUNTER — Other Ambulatory Visit (INDEPENDENT_AMBULATORY_CARE_PROVIDER_SITE_OTHER): Payer: Medicare Other

## 2013-12-11 DIAGNOSIS — R0602 Shortness of breath: Secondary | ICD-10-CM

## 2013-12-11 DIAGNOSIS — R609 Edema, unspecified: Secondary | ICD-10-CM

## 2013-12-11 DIAGNOSIS — R600 Localized edema: Secondary | ICD-10-CM

## 2014-01-01 ENCOUNTER — Encounter: Payer: Self-pay | Admitting: Adult Health

## 2014-01-09 ENCOUNTER — Encounter: Payer: Self-pay | Admitting: Adult Health

## 2014-01-26 ENCOUNTER — Encounter: Payer: Self-pay | Admitting: *Deleted

## 2014-01-26 NOTE — Progress Notes (Signed)
Chart reviewed for DM bundle. Appt 8/4/15Last labs 11/28/13

## 2014-02-06 ENCOUNTER — Ambulatory Visit: Payer: Medicare Other | Admitting: Adult Health

## 2014-02-09 ENCOUNTER — Other Ambulatory Visit: Payer: Self-pay | Admitting: Adult Health

## 2014-02-09 ENCOUNTER — Encounter: Payer: Self-pay | Admitting: Adult Health

## 2014-02-09 ENCOUNTER — Ambulatory Visit (INDEPENDENT_AMBULATORY_CARE_PROVIDER_SITE_OTHER): Payer: Medicare Other | Admitting: Adult Health

## 2014-02-09 ENCOUNTER — Ambulatory Visit (INDEPENDENT_AMBULATORY_CARE_PROVIDER_SITE_OTHER)
Admission: RE | Admit: 2014-02-09 | Discharge: 2014-02-09 | Disposition: A | Discharge status: Home / Self Care | Payer: Medicare Other | Source: Ambulatory Visit | Attending: Adult Health | Admitting: Adult Health

## 2014-02-09 VITALS — BP 119/78 | HR 69 | Temp 98.2°F | Resp 14 | Wt 177.5 lb

## 2014-02-09 DIAGNOSIS — IMO0002 Reserved for concepts with insufficient information to code with codable children: Secondary | ICD-10-CM

## 2014-02-09 DIAGNOSIS — M25512 Pain in left shoulder: Secondary | ICD-10-CM

## 2014-02-09 DIAGNOSIS — Z716 Tobacco abuse counseling: Secondary | ICD-10-CM

## 2014-02-09 DIAGNOSIS — IMO0001 Reserved for inherently not codable concepts without codable children: Secondary | ICD-10-CM

## 2014-02-09 DIAGNOSIS — F17200 Nicotine dependence, unspecified, uncomplicated: Secondary | ICD-10-CM

## 2014-02-09 DIAGNOSIS — Z7189 Other specified counseling: Secondary | ICD-10-CM

## 2014-02-09 DIAGNOSIS — M25519 Pain in unspecified shoulder: Secondary | ICD-10-CM

## 2014-02-09 DIAGNOSIS — Z13820 Encounter for screening for osteoporosis: Secondary | ICD-10-CM

## 2014-02-09 DIAGNOSIS — Z23 Encounter for immunization: Secondary | ICD-10-CM

## 2014-02-09 DIAGNOSIS — J984 Other disorders of lung: Secondary | ICD-10-CM

## 2014-02-09 DIAGNOSIS — E1165 Type 2 diabetes mellitus with hyperglycemia: Secondary | ICD-10-CM

## 2014-02-09 LAB — HEMOGLOBIN A1C: Hgb A1c MFr Bld: 7.8 % — ABNORMAL HIGH (ref 4.6–6.5)

## 2014-02-09 NOTE — Progress Notes (Signed)
Patient ID: Maria Hinton, female   DOB: 01-Jul-1946, 68 y.o.   MRN: [id number]   Subjective:    Patient ID: Maria Hinton, female    DOB: 1945/08/10, 68 y.o.   MRN: [id number]  HPI  Pt is a pleasant 68 y/o female who presents to clinic for uncontrolled diabetes follow up. She has been checking her blood glucose twice a day - before breakfast and 2 hours after her evening meal. AM levels are excellent and show good control. Her 2 hour post prandial levels are also well controlled with a few noted highs in the upper 200s range and 300s. She has had one hypoglycemic reading (40) that occurred on 12/17/13 around 4pm. She reports that she was not feeling well and checked her BG levels and noted the low reading. She immediately ate something to help raise levels. She has not had any other incidents.   She has additional concerns which are listed below as well as health maintenance updates:   2. Need for Tdap vaccination She has not received a tetanus vaccine or pertussis in greater than 10 years. We will update this today   3. Need for prophylactic vaccination against Streptococcus pneumoniae (pneumococcus) She has received 2 pneumonia vaccines prior to age 7 but never received prevnar.   4. Tobacco abuse Ongoing tobacco abuse. She has gradually decreased the amount of cigarettes she smokes daily. She has decreased from 10 to 8 to 7 and now currently at 6. Reports that multiple smokers in the home and makes it difficult. She quit once before by decreasing as she is currently doing.  5. Screening for osteoporosis She has not had a bone density test in many years. No known history of osteoporosis or osteopenia. She is on PPI and smoker.  6. Pulmonary disease Hx of lung dz - asthma. No PFTs in years.  7. Left shoulder pain Ongoing left shoulder pain with movement. Pain is worse with moving arm around towards her back and with raising arm such as when she put deodorant. She reports that mobic has  not really helped. She has tried to rest the shoulder but not improving.   Past Medical History  Diagnosis Date  . PONV (postoperative nausea and vomiting)   . Hypertension     takes Metoprolol/HCTZ daily  . Hyperlipidemia     takes Pravastatin daily  . Asthma   . Pneumonia     x 2 ;in Dec 2010/2011  . Arthritis   . Joint swelling   . Joint pain     fingers  . Chronic back pain     DDD,herniated disc/spondylosis/radiculopathy  . GERD (gastroesophageal reflux disease)     takes Omeprazole daily  . Hemorrhoids   . History of colonic polyps     colonoscopy 04/2012 - Dr Candace Cruise  . Diabetes mellitus   . Early cataracts, bilateral     Dr. Ruthine Dose - [facility]  . Depression     after death of husband;takes Ativan prn    Current Outpatient Prescriptions on File Prior to Visit  Medication Sig Dispense Refill  . albuterol (PROVENTIL HFA;VENTOLIN HFA) 108 (90 BASE) MCG/ACT inhaler Inhale 2 puffs into the lungs every 6 (six) hours as needed for wheezing.      Marland Kitchen ALPRAZolam (XANAX) 0.25 MG tablet Take 1 tablet (0.25 mg total) by mouth as needed for sleep.  30 tablet  5  . atenolol (TENORMIN) 50 MG tablet Take 1 tablet (50 mg total) by mouth 2 (  two) times daily.  180 tablet  3  . atorvastatin (LIPITOR) 80 MG tablet Take 1 tablet (80 mg total) by mouth daily.  30 tablet  6  . Blood Glucose Monitoring Suppl (ONE TOUCH ULTRA SYSTEM KIT) W/DEVICE KIT Pt needs OneTouch Ultra Blue glucometer dx 250.02  1 each  0  . glucose blood (ONE TOUCH ULTRA TEST) test strip Use as instructed bid, has OneTouch Ultra Blue  dx: 250.02  100 each  3  . hydrochlorothiazide (HYDRODIURIL) 12.5 MG tablet Take 1 tablet (12.5 mg total) by mouth daily.  30 tablet  6  . Insulin Detemir (LEVEMIR) 100 UNIT/ML Pen Inject 13 Units into the skin 2 (two) times daily.       . Lancets (ONETOUCH ULTRASOFT) lancets Use as instructed bid dx 250.02  100 each  3  . meloxicam (MOBIC) 7.5 MG tablet Take 1 tablet (7.5 mg total)  by mouth daily.  30 tablet  6  . metFORMIN (GLUCOPHAGE) 1000 MG tablet Take 1 tablet (1,000 mg total) by mouth 2 (two) times daily with a meal.  180 tablet  3  . omeprazole (PRILOSEC) 20 MG capsule Take 1 capsule (20 mg total) by mouth daily.  90 capsule  3   No current facility-administered medications on file prior to visit.     Review of Systems  Constitutional: Positive for fatigue (mild fatigue.).  HENT: Negative.   Respiratory: Negative.  Negative for cough, chest tightness, shortness of breath and wheezing.   Cardiovascular: Negative.   Gastrointestinal: Negative.   Genitourinary: Negative.   Musculoskeletal: Positive for arthralgias (left shoulder pain). Negative for joint swelling.  Neurological: Negative.  Negative for dizziness, weakness and numbness.  Psychiatric/Behavioral: Negative for sleep disturbance (does well with xanax). The patient is not nervous/anxious.   All other systems reviewed and are negative.      Objective:  BP 119/78  Pulse 69  Temp(Src) 98.2 F (36.8 C) (Oral)  Resp 14  Wt 177 lb 8 oz (80.513 kg)  SpO2 97%   Physical Exam  Constitutional: She is oriented to person, place, and time. No distress.  HENT:  Head: Normocephalic and atraumatic.  Eyes: Conjunctivae and EOM are normal.  Neck: Normal range of motion. Neck supple.  Cardiovascular: Normal rate, regular rhythm, normal heart sounds and intact distal pulses.  Exam reveals no gallop and no friction rub.   No murmur heard. Pulmonary/Chest: Effort normal and breath sounds normal. No respiratory distress. She has no wheezes. She has no rales.  Musculoskeletal:  Left arm decreased ROM. Difficulty with abduction. Easily moves arm in front of her but difficulty with moving arm around to her back.  Neurological: She is alert and oriented to person, place, and time. She has normal reflexes. Coordination normal.  Skin: Skin is warm and dry.  Psychiatric: She has a normal mood and affect. Her  behavior is normal. Judgment and thought content normal.      Assessment & Plan:   1. DM (diabetes mellitus), type 2, uncontrolled AM readings look very well controlled. Most PM readings are also controlled. Depending on diet (ie pasta). She is compliant with meds. Check A1c and adjust meds as needed. Follow up in 3 months. - Hemoglobin A1c  2. Need for Tdap vaccination Given in clinic today - Tdap vaccine greater than or equal to 7yo IM  3. Need for prophylactic vaccination against Streptococcus pneumoniae (pneumococcus) Given in clinic today - Pneumococcal conjugate vaccine 13-valent  4. Tobacco abuse counseling Discussed and  encouraged to quit. She is down to 7 cigarettes daily  5. Screening for osteoporosis Bone density scan ordered - DG Bone Density; Future  6. Pulmonary disease Hx of asthma, tobacco abuse. Refer for PFTs - Ambulatory referral to Pulmonology  7. Left shoulder pain Send for xray. ?bursitis vs tendonitis. She has had pain for ~ 2-3 months without resolution. We discussed possible referral to see Dr. Edilia Bo at [facility] - DG Shoulder Left; Future

## 2014-02-14 ENCOUNTER — Encounter: Payer: Self-pay | Admitting: Family Medicine

## 2014-02-14 ENCOUNTER — Ambulatory Visit (INDEPENDENT_AMBULATORY_CARE_PROVIDER_SITE_OTHER): Payer: Medicare Other | Admitting: Family Medicine

## 2014-02-14 VITALS — BP 132/80 | HR 73 | Temp 98.3°F | Ht 64.25 in | Wt 179.0 lb

## 2014-02-14 DIAGNOSIS — M7500 Adhesive capsulitis of unspecified shoulder: Secondary | ICD-10-CM

## 2014-02-14 DIAGNOSIS — M7502 Adhesive capsulitis of left shoulder: Secondary | ICD-10-CM

## 2014-02-14 NOTE — Progress Notes (Signed)
Pre visit review using our clinic review tool, if applicable. No additional management support is needed unless otherwise documented below in the visit note. 

## 2014-02-14 NOTE — Progress Notes (Signed)
02/14/2014    ID: Maria Hinton   MRN: [id number]  DOB: [birth year]  Primary Physician:  Charolette Forward, NP  Chief Complaint: Shoulder Pain  Subjective:   History of Present Illness:  Maria Hinton is a 68 y.o. very pleasant female patient who presents with the following: shoulder pain  The patient noted above presents with shoulder pain that has been ongoing for 2-3 months. there is no history of trauma or accident. The patient denies neck pain or radicular symptoms. No shoulder blade pain Denies dislocation, subluxation, separation of the shoulder. The patient does complain of pain in the overhead plane - cannot move in this direction. Pain in a t-shirt distribution. Significant restriction of motion.  Poorly controlled DM.  Medications Tried: Mobic Ice or Heat: minimal help Tried PT: No  Prior shoulder Injury: No Prior surgery: No Prior fracture: No  Past Medical History, Surgical History, Social History, Family History, Medications, and allergies reviewed and updated if relevant.   Review of Systems  GEN: No fevers, chills. Nontoxic. Primarily MSK c/o today. MSK: Detailed in the HPI GI: tolerating PO intake without difficulty Neuro: No numbness, parasthesias, or tingling associated. Otherwise the pertinent positives of the ROS are noted above.     Objective:   Physical Examination Filed Vitals:   02/14/14 1123  BP: 132/80  Pulse: 73  Temp: 98.3 F (36.8 C)  TempSrc: Oral  Height: 5' 4.25" (1.632 m)  Weight: 179 lb (81.194 kg)    GEN: Well-developed,well-nourished,in no acute distress; alert,appropriate and cooperative throughout examination HEENT: Normocephalic and atraumatic without obvious abnormalities. Ears, externally no deformities PULM: Breathing comfortably in no respiratory distress EXT: No clubbing, cyanosis, or edema PSYCH: Normally interactive. Cooperative during the interview. Pleasant. Friendly and conversant. Not anxious or depressed  appearing. Normal, full affect.  Shoulder: LEFT Inspection: No muscle wasting or winging Ecchymosis/edema: neg  AC joint, scapula, clavicle: NT Cervical spine: NT, full ROM Spurling's: neg Abduction: 5/5, 110 degrees of abduction is possible Flexion: 5/5, 110 degrees IR, full, lift-off: 5/5, none in 90 of abd ER at neutral: 5/5, lacks 60 deg of ER in 90 deg abd AC crossover and compression: unable to complete Additional special testing is equivocal given lack of motion C5-T1 intact Sensation intact Grip 5/5  Dg Shoulder Left  02/09/2014   CLINICAL DATA:  Left shoulder pain  EXAM: LEFT SHOULDER - 2+ VIEW  COMPARISON:  None.  FINDINGS: Three views of left shoulder submitted. No acute fracture or subluxation. Mild spurring of humeral head. Mild degenerative changes AC joint.  IMPRESSION: No acute fracture or subluxation.  Mild degenerative changes.   Electronically Signed   By: Lahoma Crocker M.D.   On: 02/09/2014 09:35    Assessment & Plan:   Adhesive capsulitis of left shoulder - Plan: Ambulatory referral to Physical Therapy  Classic frozen shoulder. 10-20% of all diabetics will get one in their lifetime.   >25 minutes spent in face to face time with patient, >50% spent in counselling or coordination of care  Patient was given a systematic ROM protocol from [employer] to be done daily. Emphasized importance of adherence, help of PT, daily HEP.  The average length of total symptoms is 12-18 months going through 3 different phases in the freezing and thawing process. Reviewed all with patient. Most people get full motion, but occaisionally 10-15% permanent deficit will happen.  Tylenol or NSAID of choice prn for pain relief Intraarticular shoulder injections discussed with patient, which have good evidence for accelerating  the thawing phase, but she did not want one.   Patient will be sent for formal PT for aggressive frozen shoulder ROM. Will need RTC str and scapular stabilization to fix  underlying mechanics.  Follow-up: 6-8 weeks  I appreciate the opportunity to evaluate this very friendly patient. If you have any question regarding her care or prognosis, do not hesitate to ask.   Orders Placed This Encounter  Procedures  . Ambulatory referral to Physical Therapy    Signed,  Frederico Hamman T. , MD, [facility] [place], [address] Phone: [phone] Fax: [phone]

## 2014-02-21 ENCOUNTER — Telehealth: Payer: Self-pay | Admitting: *Deleted

## 2014-02-21 ENCOUNTER — Encounter: Payer: Self-pay | Admitting: Internal Medicine

## 2014-02-21 ENCOUNTER — Ambulatory Visit (INDEPENDENT_AMBULATORY_CARE_PROVIDER_SITE_OTHER): Payer: Medicare Other | Admitting: Internal Medicine

## 2014-02-21 VITALS — BP 134/68 | HR 77 | Ht 64.0 in | Wt 177.0 lb

## 2014-02-21 DIAGNOSIS — R0602 Shortness of breath: Secondary | ICD-10-CM

## 2014-02-21 DIAGNOSIS — Z87891 Personal history of nicotine dependence: Secondary | ICD-10-CM | POA: Insufficient documentation

## 2014-02-21 DIAGNOSIS — R0600 Dyspnea, unspecified: Secondary | ICD-10-CM

## 2014-02-21 DIAGNOSIS — F17200 Nicotine dependence, unspecified, uncomplicated: Secondary | ICD-10-CM

## 2014-02-21 DIAGNOSIS — R0609 Other forms of dyspnea: Secondary | ICD-10-CM

## 2014-02-21 DIAGNOSIS — J45909 Unspecified asthma, uncomplicated: Secondary | ICD-10-CM

## 2014-02-21 DIAGNOSIS — J984 Other disorders of lung: Secondary | ICD-10-CM

## 2014-02-21 DIAGNOSIS — R0989 Other specified symptoms and signs involving the circulatory and respiratory systems: Secondary | ICD-10-CM

## 2014-02-21 DIAGNOSIS — Z720 Tobacco use: Secondary | ICD-10-CM

## 2014-02-21 LAB — BASIC METABOLIC PANEL
BUN: 12 mg/dL (ref 6–23)
CO2: 29 mEq/L (ref 19–32)
Calcium: 9 mg/dL (ref 8.4–10.5)
Chloride: 96 mEq/L (ref 96–112)
Creatinine, Ser: 1 mg/dL (ref 0.4–1.2)
GFR: 60.34 mL/min (ref >60.00)
Glucose, Bld: 145 mg/dL — ABNORMAL HIGH (ref 70–99)
Potassium: 4.1 mEq/L (ref 3.5–5.1)
Sodium: 135 mEq/L (ref 135–145)

## 2014-02-21 NOTE — Assessment & Plan Note (Signed)
Tobacco Cessation - Counseling regarding benefits of smoking cessation strategies was provided for more than 12 min. - Educated that at this time smoking- cessation represents the single most important step that patient can take to enhance the length and quality of live. - Educated patient regarding alternatives of behavior interventions, pharmacotherapy including NRT and non-nicotine therapy such, and combinations of both. - Patient at this time:will try to quit on her own. She is currently down to 5-6 cigs per day.

## 2014-02-21 NOTE — Patient Instructions (Signed)
We will order a PFT at [facility]. We will schedule a 6 minute walk at [facility]. We will schedule a CT chest at [facility]. We will get labwork today.  We will follow up in 1 month to review.

## 2014-02-21 NOTE — Telephone Encounter (Signed)
If BMP results of 02-21-2014 can be faxed [phone] Attention CT

## 2014-02-21 NOTE — Assessment & Plan Note (Signed)
The patient carries a diagnosis of asthma. Based on her symptoms and use of rescue medications she has mild intermittent asthma, extrinsic type.  Plan Albuterol as needed Primary function testing 6 minute walk test

## 2014-02-21 NOTE — Assessment & Plan Note (Signed)
Multifactorial: Deconditioning, obesity, tobacco abuse, asthma   Plan Weight loss and healthy eating and exercise Patient counseled on tobacco cessation Plan as stated above asthma

## 2014-02-21 NOTE — Progress Notes (Signed)
Date: 02/21/2014  MRN# [id number] Maria Hinton 1946-01-26   Maria Hinton is a 68 y.o. old female seen in consultation for established care visit and asthma management.   CC:  Chief Complaint  Patient presents with  . Advice Only    Referred by Charolette Forward, NP, for pulmonary disease.  pt states that she was told by her NP that she was wheezing.  Pt c/o SOB with exertion, occasional prod cough with clear to yellow mucus.      HPI:  Patient is a pleasant 68 year old female presenting in the clinic today for reestablish care and management of her asthma. History is per the patient. Patient states that she was diagnosed with asthma in 1995 she had a bad bout of bronchitis, during that time she was placed on prednisone for her bronchitis where she continued to have cough and wheezing. She stated she saw a pulmonologist at that time and was told based on a PFT and symptoms that she had asthma she was managed briefly with Advair for a few months and albuterol rescue inhaler; she stated after about 4 months she was weaned off the Advair and has only been on rescue inhaler. She states that currently she has occasional wheezing and sometimes shortness of breath with exertion. He states that she can walk a mile before having any major dyspnea; off note she had hip replacement done last year and has not been able to walk like she used to. In the last year she has had no ER visits, no urgent care visits, no use of prednisone, no intubation, no antibiotics for asthma. She has never been intubated for an asthma exacerbation. She states that she will use her rescue inhaler maybe 1-2 times a 6 month period. Her triggers are wood filings and saw dust for asthma.  She previously smoked one pack per day for 36 years, in between that she quit for about 13 years, but resumed smoking 5 years ago when her husband passed away; currently she smokes 5-6 cigarettes per day. Patient admits she has 2 dogs at home that stay inside  with her. She admits to morning symptoms of smoker's cough (described as clear thick sputum production), mild wheeze, and mild shortness of breath. Patient stated that when she was about 68 years old she had a surgery done a left chest wall for abnormal veins, stated she had 3 ribs taken out at that time.  PMHX:   Past Medical History  Diagnosis Date  . PONV (postoperative nausea and vomiting)   . Hypertension     takes Metoprolol/HCTZ daily  . Hyperlipidemia     takes Pravastatin daily  . Asthma   . Pneumonia     x 2 ;in Dec 2010/2011  . Arthritis   . Joint swelling   . Joint pain     fingers  . Chronic back pain     DDD,herniated disc/spondylosis/radiculopathy  . GERD (gastroesophageal reflux disease)     takes Omeprazole daily  . Hemorrhoids   . History of colonic polyps     colonoscopy 04/2012 - Dr Candace Cruise  . Diabetes mellitus   . Early cataracts, bilateral     Dr. Ruthine Dose - [facility]  . Depression     after death of husband;takes Ativan prn   Surgical Hx:  Past Surgical History  Procedure Laterality Date  . Thoracic disc surgery  at age 29 and 61     mass of veins that had gotten into muscle  and wrapped around rib;3 ribs also removed from left side  . Abdominal hysterectomy      at age 32  . Back surgery  20+yrs ago  . Foot surgery  2011    bunionectomy and knot removed from bottom of both feet  . Melanoma excision  2011    on nose/back on neck  . Vagina surgery      vaginal wall ruptured   . Tonsillectomy  as a child    and adenoids   . Colonoscopy    . Esophagogastroduodenoscopy  2011  . Tubal ligation    . Lumbar laminectomy/decompression microdiscectomy  09/10/2011    Procedure: LUMBAR LAMINECTOMY/DECOMPRESSION MICRODISCECTOMY 1 LEVEL;  Surgeon: Hosie Spangle, MD;  Location: [facility];  Service: Neurosurgery;  Laterality: Right;  RIGHT Lumbar  Laminotomy and microdiskectomy Lumbar Four-Five  . Total hip arthroplasty Left 2014    Dr. Mack Guise    Family Hx:  Family History  Problem Relation Age of Onset  . Anesthesia problems Neg Hx   . Hypotension Neg Hx   . Malignant hyperthermia Neg Hx   . Pseudochol deficiency Neg Hx   . Heart disease Brother     valve replacement  . COPD Brother   . COPD Mother     Emphysema  . Diabetes Mother   . Heart disease Father   . Seizures Father   . Cancer Father     Lung cancer with brain metastasis  . Asthma Brother   . COPD Son 79   Social Hx:   History  Substance Use Topics  . Smoking status: Current Every Day Smoker -- 0.25 packs/day for 20 years    Types: Cigarettes  . Smokeless tobacco: Never Used     Comment: Pt had quit smoking for 13 years before starting back in 2010  . Alcohol Use: No   Medication:   Current Outpatient Rx  Name  Route  Sig  Dispense  Refill  . albuterol (PROVENTIL HFA;VENTOLIN HFA) 108 (90 BASE) MCG/ACT inhaler   Inhalation   Inhale 2 puffs into the lungs every 6 (six) hours as needed for wheezing.         Marland Kitchen ALPRAZolam (XANAX) 0.25 MG tablet   Oral   Take 1 tablet (0.25 mg total) by mouth as needed for sleep.   30 tablet   5   . atenolol (TENORMIN) 50 MG tablet   Oral   Take 1 tablet (50 mg total) by mouth 2 (two) times daily.   180 tablet   3   . atorvastatin (LIPITOR) 80 MG tablet   Oral   Take 1 tablet (80 mg total) by mouth daily.   30 tablet   6   . Blood Glucose Monitoring Suppl (ONE TOUCH ULTRA SYSTEM KIT) W/DEVICE KIT      Pt needs OneTouch Ultra Blue glucometer dx 250.02   1 each   0   . glipiZIDE (GLUCOTROL) 5 MG tablet      5 mg. Take 1 tablet at breakfast and 1 tablets at dinner         . glucose blood (ONE TOUCH ULTRA TEST) test strip      Use as instructed bid, has OneTouch Ultra Blue  dx: 250.02   100 each   3   . hydrochlorothiazide (HYDRODIURIL) 12.5 MG tablet   Oral   Take 1 tablet (12.5 mg total) by mouth daily.   30 tablet   6   . Insulin Detemir (LEVEMIR)  100 UNIT/ML Pen   Subcutaneous   Inject  13 Units into the skin 2 (two) times daily.          . Lancets (ONETOUCH ULTRASOFT) lancets      Use as instructed bid dx 250.02   100 each   3   . meloxicam (MOBIC) 7.5 MG tablet   Oral   Take 1 tablet (7.5 mg total) by mouth daily.   30 tablet   6   . metFORMIN (GLUCOPHAGE) 1000 MG tablet   Oral   Take 1 tablet (1,000 mg total) by mouth 2 (two) times daily with a meal.   180 tablet   3   . omeprazole (PRILOSEC) 20 MG capsule   Oral   Take 1 capsule (20 mg total) by mouth daily.   90 capsule   3       Allergies:  Vicodin  Review of Systems: Gen:  Denies  fever, sweats, chills HEENT: Denies blurred vision, double vision, ear pain, eye pain, hearing loss, nose bleeds, sore throat Cvc:  No dizziness, chest pain or heaviness Resp:   Denies cough or sputum porduction, shortness of breath, wheezing Gi: Denies swallowing difficulty, stomach pain, nausea or vomiting, diarrhea, constipation, bowel incontinence Gu:  Denies bladder incontinence, burning urine Ext:   No Joint pain, stiffness or swelling Skin: No skin rash, easy bruising or bleeding or hives Endoc:  No polyuria, polydipsia , polyphagia or weight change Psych: No depression, insomnia or hallucinations  Other:  All other systems negative  Physical Examination:   VS: BP 134/68  Pulse 77  Ht 5' 4"  (1.626 m)  Wt 177 lb (80.287 kg)  BMI 30.37 kg/m2  SpO2 97%  General Appearance: No distress  Neuro:without focal findings, mental status, speech normal, alert and oriented, cranial nerves 2-12 intact, reflexes normal and symmetric, sensation grossly normal  HEENT: PERRLA, EOM intact, no ptosis, no other lesions noticed; Mallampati 2 Pulmonary: Good historian and disoriented, mild decreased breath sounds at the bilateral bases, nonproductive cough (mild)   CardiovascularNormal S1,S2.  No m/r/g.  Abdominal aorta pulsation normal.    Abdomen: Benign, Soft, non-tender, No masses, hepatosplenomegaly, No  lymphadenopathy. Obese abdomen Renal:  No costovertebral tenderness  GU:  No performed at this time. Endoc: No evident thyromegaly, no signs of acromegaly or Cushing features Skin:   warm, no rashes, no ecchymosis  Extremities: normal, no cyanosis, clubbing, no edema, warm with normal capillary refill. Other findings: None    Rad results:  Chest x-ray May 2014 The heart and mediastinum are stable. No focal pulmonary opacities. Multiple small metallic wires overlie the posterior lateral left hemithorax. There is mild right apical pleural-parenchymal thickening, similar to prior.  IMPRESSION: 1. No acute cardiopulmonary disease. 2. Mild asymmetric right apical pleural-parenchymal thickening is similar to prior. Continued followup is suggested to ensure stability.  Assessment and Plan: Extrinsic asthma, unspecified The patient carries a diagnosis of asthma. Based on her symptoms and use of rescue medications she has mild intermittent asthma, extrinsic type.  Plan Albuterol as needed Primary function testing 6 minute walk test  SOB (shortness of breath) Multifactorial: Deconditioning, obesity, tobacco abuse, asthma   Plan Weight loss and healthy eating and exercise Patient counseled on tobacco cessation Plan as stated above asthma  Pulmonary disease Right apical thickening. Will further Evaluate with CT Chest   DOE (dyspnea on exertion) Multifactorial: deconditioning, obesity, asthma, tobacco abuse  PLAN: Increase exercise, start walking 20-30 minutes a day as tolerated Healthy weight loss  and diet control Plan as stated above asthma Patient counseled on tobacco abuse  Tobacco abuse disorder Tobacco Cessation - Counseling regarding benefits of smoking cessation strategies was provided for more than 12 min. - Educated that at this time smoking- cessation represents the single most important step that patient can take to enhance the length and quality of live. -  Educated patient regarding alternatives of behavior interventions, pharmacotherapy including NRT and non-nicotine therapy such, and combinations of both. - Patient at this time:will try to quit on her own. She is currently down to 5-6 cigs per day.      Updated Medication List Outpatient Encounter Prescriptions as of 02/21/2014  Medication Sig  . albuterol (PROVENTIL HFA;VENTOLIN HFA) 108 (90 BASE) MCG/ACT inhaler Inhale 2 puffs into the lungs every 6 (six) hours as needed for wheezing.  Marland Kitchen ALPRAZolam (XANAX) 0.25 MG tablet Take 1 tablet (0.25 mg total) by mouth as needed for sleep.  Marland Kitchen atenolol (TENORMIN) 50 MG tablet Take 1 tablet (50 mg total) by mouth 2 (two) times daily.  Marland Kitchen atorvastatin (LIPITOR) 80 MG tablet Take 1 tablet (80 mg total) by mouth daily.  . Blood Glucose Monitoring Suppl (ONE TOUCH ULTRA SYSTEM KIT) W/DEVICE KIT Pt needs OneTouch Ultra Blue glucometer dx 250.02  . glipiZIDE (GLUCOTROL) 5 MG tablet 5 mg. Take 1 tablet at breakfast and 1 tablets at dinner  . glucose blood (ONE TOUCH ULTRA TEST) test strip Use as instructed bid, has OneTouch Ultra Blue  dx: 250.02  . hydrochlorothiazide (HYDRODIURIL) 12.5 MG tablet Take 1 tablet (12.5 mg total) by mouth daily.  . Insulin Detemir (LEVEMIR) 100 UNIT/ML Pen Inject 13 Units into the skin 2 (two) times daily.   . Lancets (ONETOUCH ULTRASOFT) lancets Use as instructed bid dx 250.02  . meloxicam (MOBIC) 7.5 MG tablet Take 1 tablet (7.5 mg total) by mouth daily.  . metFORMIN (GLUCOPHAGE) 1000 MG tablet Take 1 tablet (1,000 mg total) by mouth 2 (two) times daily with a meal.  . omeprazole (PRILOSEC) 20 MG capsule Take 1 capsule (20 mg total) by mouth daily.    Orders for this visit: Orders Placed This Encounter  Procedures  . CT Chest W Contrast    Standing Status: Future     Number of Occurrences:      Standing Expiration Date: 04/24/2015    Order Specific Question:  Reason for Exam (SYMPTOM  OR DIAGNOSIS REQUIRED)    Answer:   SOB    Order Specific Question:  Preferred imaging location?    Answer:  [facility]  . Basic Metabolic Panel (BMET)    Standing Status: Future     Number of Occurrences: 1     Standing Expiration Date: 02/22/2015    Order Specific Question:  Has the patient fasted?    Answer:  No  . Pulmonary function test    Standing Status: Future     Number of Occurrences:      Standing Expiration Date: 02/22/2015    Scheduling Instructions:     Please schedule full PFT and 6 minute walk at [facility]  Thank you.    Order Specific Question:  Where should this test be performed?    Answer:  Other    Order Specific Question:  Full PFT: includes the following: basic spirometry, spirometry pre & post bronchodilator, diffusion capacity (DLCO), lung volumes    Answer:  Full PFT    Order Specific Question:  MIP/MEP    Answer:  No    Order  Specific Question:  6 minute walk    Answer:  Yes    Order Specific Question:  ABG    Answer:  No    Order Specific Question:  Diffusion capacity (DLCO)    Answer:  No    Order Specific Question:  Lung volumes    Answer:  No    Order Specific Question:  Methacholine challenge    Answer:  No     Thank  you for the consultation and for allowing [facility] and to assist in the care of your patient. Our recommendations are noted above.  Please contact us if we can be of further service.   Vilinda Boehringer, MD [facility] Office Number: [phone]

## 2014-02-21 NOTE — Assessment & Plan Note (Signed)
Right apical thickening. Will further Evaluate with CT Chest

## 2014-02-21 NOTE — Assessment & Plan Note (Signed)
Multifactorial: deconditioning, obesity, asthma, tobacco abuse  PLAN: Increase exercise, start walking 20-30 minutes a day as tolerated Healthy weight loss and diet control Plan as stated above asthma Patient counseled on tobacco abuse

## 2014-02-22 ENCOUNTER — Ambulatory Visit: Payer: Self-pay | Admitting: Adult Health

## 2014-02-22 ENCOUNTER — Institutional Professional Consult (permissible substitution): Payer: Medicare Other | Admitting: Internal Medicine

## 2014-02-22 LAB — HM DEXA SCAN: HM Dexa Scan: NORMAL

## 2014-02-22 NOTE — Telephone Encounter (Signed)
This has been faxed.  Nothing further needed. 

## 2014-03-02 ENCOUNTER — Other Ambulatory Visit: Payer: Self-pay | Admitting: Adult Health

## 2014-03-02 NOTE — Telephone Encounter (Signed)
Last refill 6.26.15, last OV 8.7.15.  Please advise refill

## 2014-03-08 ENCOUNTER — Ambulatory Visit: Payer: Self-pay | Admitting: Internal Medicine

## 2014-04-02 ENCOUNTER — Ambulatory Visit: Payer: Medicare Other | Admitting: Internal Medicine

## 2014-04-04 ENCOUNTER — Encounter: Payer: Self-pay | Admitting: Internal Medicine

## 2014-04-04 ENCOUNTER — Ambulatory Visit (INDEPENDENT_AMBULATORY_CARE_PROVIDER_SITE_OTHER): Payer: Medicare Other | Admitting: Internal Medicine

## 2014-04-04 VITALS — BP 132/76 | HR 80 | Ht 64.0 in | Wt 180.0 lb

## 2014-04-04 DIAGNOSIS — R0989 Other specified symptoms and signs involving the circulatory and respiratory systems: Secondary | ICD-10-CM

## 2014-04-04 DIAGNOSIS — B4481 Allergic bronchopulmonary aspergillosis: Secondary | ICD-10-CM

## 2014-04-04 DIAGNOSIS — R0602 Shortness of breath: Secondary | ICD-10-CM

## 2014-04-04 DIAGNOSIS — R0600 Dyspnea, unspecified: Secondary | ICD-10-CM

## 2014-04-04 DIAGNOSIS — F17200 Nicotine dependence, unspecified, uncomplicated: Secondary | ICD-10-CM

## 2014-04-04 DIAGNOSIS — Z7189 Other specified counseling: Secondary | ICD-10-CM

## 2014-04-04 DIAGNOSIS — J984 Other disorders of lung: Secondary | ICD-10-CM

## 2014-04-04 DIAGNOSIS — R0609 Other forms of dyspnea: Secondary | ICD-10-CM

## 2014-04-04 DIAGNOSIS — J4520 Mild intermittent asthma, uncomplicated: Secondary | ICD-10-CM

## 2014-04-04 DIAGNOSIS — Z716 Tobacco abuse counseling: Secondary | ICD-10-CM

## 2014-04-04 DIAGNOSIS — J449 Chronic obstructive pulmonary disease, unspecified: Secondary | ICD-10-CM

## 2014-04-04 DIAGNOSIS — J45909 Unspecified asthma, uncomplicated: Secondary | ICD-10-CM

## 2014-04-04 NOTE — Assessment & Plan Note (Signed)
The patient carries a diagnosis of asthma. Based on her symptoms and use of rescue medications she has mild intermittent asthma, extrinsic type, she is also noted to have an obstructive defect on her recent pulmonary function testing.  Plan Albuterol as needed Patient started on Spiriva

## 2014-04-04 NOTE — Assessment & Plan Note (Signed)
Patient most likely has asthma with COPD. Currently well-controlled when necessary albuterol. Recent PFT show moderate obstruction with decrease in FEV1-patient was started on Spiriva.

## 2014-04-04 NOTE — Assessment & Plan Note (Signed)
Patient with exacerbation or need for steroids in the past 1-2 years. COPD B, Moderate airflow limitation (GOLD STAGE I)  Plan -Continue with when necessary albuterol Patient started on Spiriva today (administration, drug use, side effects of explained to patient).

## 2014-04-04 NOTE — Assessment & Plan Note (Signed)
Multifactorial: deconditioning, obesity, asthma, tobacco abuse  PLAN: Increase exercise, start walking 20-30 minutes a day as tolerated Healthy weight loss and diet control Continue with Spiriva and when necessary albuterol Patient counseled on tobacco abuse

## 2014-04-04 NOTE — Assessment & Plan Note (Signed)
Right apical pleural scarring, this is most likely a sequelae of her current emphysematous disease. Could also be related to her previous surgery when she was a teenager. Tiny metallic object seen on CAT scan posterior left heart border-review of chest x-ray from 2013 and review of CT scan from 2001 showed that this object has been there; this is most likely a residual sequelae of her surgery when she was a teenager.

## 2014-04-04 NOTE — Progress Notes (Signed)
MRN# [id number] Maria Hinton 1946-05-05   CC: Chief Complaint  Patient presents with  . Follow-up    Review pft and ct chest.  Pt c/o sob with exertion, some prod cough worse in mornings with clear-yellow mucus. MMRC scale:1.  CAT score 10.       Brief History: Initial visit on 02/21/2014 Patient is a pleasant 68 year old female presenting in the clinic today for reestablish care and management of her asthma. History is per the patient.  Patient states that she was diagnosed with asthma in 1995 she had a bad bout of bronchitis, during that time she was placed on prednisone for her bronchitis where she continued to have cough and wheezing. She stated she saw a pulmonologist at that time and was told based on a PFT and symptoms that she had asthma she was managed briefly with Advair for a few months and albuterol rescue inhaler; she stated after about 4 months she was weaned off the Advair and has only been on rescue inhaler. She states that currently she has occasional wheezing and sometimes shortness of breath with exertion. He states that she can walk a mile before having any major dyspnea; off note she had hip replacement done last year and has not been able to walk like she used to. In the last year she has had no ER visits, no urgent care visits, no use of prednisone, no intubation, no antibiotics for asthma. She has never been intubated for an asthma exacerbation. She states that she will use her rescue inhaler maybe 1-2 times a 6 month period. Her triggers are wood filings and saw dust for asthma. She previously smoked one pack per day for 36 years, in between that she quit for about 13 years, but resumed smoking 5 years ago when her husband passed away; currently she smokes 5-6 cigarettes per day. Patient admits she has 2 dogs at home that stay inside with her. She admits to morning symptoms of smoker's cough (described as clear thick sputum production), mild wheeze, and mild shortness of  breath.  Patient stated that when she was about 68 years old she had a surgery done a left chest wall for abnormal veins, stated she had 3 ribs taken out at that time. -At this visit and further workup a pulmonary function testing, repeat CAT scan of chest, and followup.   Events since last clinic visit:   Patient presents today for followup of her CT chest and pulmonary function testing results. She states that she's currently smoking 1.5 packs per day, which is an increase since her last visit. She states that she's had some recent stressors, grandson recent surgery for a brain tumor, her mother was recently ill. Patient states that she still has some mild to moderate shortness of breath and dyspnea on exertion, but has not been using albuterol (she does not feel that she needs). Patient still endorses a morning cough with mild thick whitish sputum. Today her CAT score is 10, and her mMRC is 1    PMHX:   Past Medical History  Diagnosis Date  . PONV (postoperative nausea and vomiting)   . Hypertension     takes Metoprolol/HCTZ daily  . Hyperlipidemia     takes Pravastatin daily  . Asthma   . Pneumonia     x 2 ;in Dec 2010/2011  . Arthritis   . Joint swelling   . Joint pain     fingers  . Chronic back pain  DDD,herniated disc/spondylosis/radiculopathy  . GERD (gastroesophageal reflux disease)     takes Omeprazole daily  . Hemorrhoids   . History of colonic polyps     colonoscopy 04/2012 - Dr Candace Cruise  . Diabetes mellitus   . Early cataracts, bilateral     Dr. Ruthine Dose - [facility]  . Depression     after death of husband;takes Ativan prn   Surgical Hx:  Past Surgical History  Procedure Laterality Date  . Thoracic disc surgery  at age 13 and 19     mass of veins that had gotten into muscle and wrapped around rib;3 ribs also removed from left side  . Abdominal hysterectomy      at age 37  . Back surgery  20+yrs ago  . Foot surgery  2011    bunionectomy and  knot removed from bottom of both feet  . Melanoma excision  2011    on nose/back on neck  . Vagina surgery      vaginal wall ruptured   . Tonsillectomy  as a child    and adenoids   . Colonoscopy    . Esophagogastroduodenoscopy  2011  . Tubal ligation    . Lumbar laminectomy/decompression microdiscectomy  09/10/2011    Procedure: LUMBAR LAMINECTOMY/DECOMPRESSION MICRODISCECTOMY 1 LEVEL;  Surgeon: Hosie Spangle, MD;  Location: [place] [facility];  Service: Neurosurgery;  Laterality: Right;  RIGHT Lumbar  Laminotomy and microdiskectomy Lumbar Four-Five  . Total hip arthroplasty Left 2014    Dr. Mack Guise   Family Hx:  Family History  Problem Relation Age of Onset  . Anesthesia problems Neg Hx   . Hypotension Neg Hx   . Malignant hyperthermia Neg Hx   . Pseudochol deficiency Neg Hx   . Heart disease Brother     valve replacement  . COPD Brother   . COPD Mother     Emphysema  . Diabetes Mother   . Heart disease Father   . Seizures Father   . Cancer Father     Lung cancer with brain metastasis  . Asthma Brother   . COPD Son 38   Social Hx:   History  Substance Use Topics  . Smoking status: Current Every Day Smoker -- 1.50 packs/day for 20 years    Types: Cigarettes  . Smokeless tobacco: Never Used     Comment: Pt had quit smoking for 13 years before starting back in 2010  . Alcohol Use: No   Medication:   Current Outpatient Rx  Name  Route  Sig  Dispense  Refill  . albuterol (PROVENTIL HFA;VENTOLIN HFA) 108 (90 BASE) MCG/ACT inhaler   Inhalation   Inhale 2 puffs into the lungs every 6 (six) hours as needed for wheezing.         Marland Kitchen ALPRAZolam (XANAX) 0.25 MG tablet   Oral   Take 1 tablet (0.25 mg total) by mouth as needed for sleep.   30 tablet   5   . atenolol (TENORMIN) 50 MG tablet   Oral   Take 1 tablet (50 mg total) by mouth 2 (two) times daily.   180 tablet   3   . atorvastatin (LIPITOR) 80 MG tablet   Oral   Take 1 tablet (80 mg total) by mouth daily.    30 tablet   6   . Blood Glucose Monitoring Suppl (ONE TOUCH ULTRA SYSTEM KIT) W/DEVICE KIT      Pt needs OneTouch Ultra Blue glucometer dx 250.02   1 each  0   . glipiZIDE (GLUCOTROL) 5 MG tablet      5 mg. Take 1 tablet at breakfast and 1 tablets at dinner         . glucose blood (ONE TOUCH ULTRA TEST) test strip      Use as instructed bid, has OneTouch Ultra Blue  dx: 250.02   100 each   3   . hydrochlorothiazide (HYDRODIURIL) 12.5 MG tablet   Oral   Take 1 tablet (12.5 mg total) by mouth daily.   30 tablet   6   . Insulin Detemir (LEVEMIR) 100 UNIT/ML Pen   Subcutaneous   Inject 10-13 Units into the skin 2 (two) times daily. 10 units in the morning, 13 units at night         . Lancets (ONETOUCH ULTRASOFT) lancets      Use as instructed bid dx 250.02   100 each   3   . meloxicam (MOBIC) 7.5 MG tablet      TAKE ONE TABLET BY MOUTH ONCE DAILY   30 tablet   6   . metFORMIN (GLUCOPHAGE) 1000 MG tablet   Oral   Take 1 tablet (1,000 mg total) by mouth 2 (two) times daily with a meal.   180 tablet   3   . omeprazole (PRILOSEC) 20 MG capsule   Oral   Take 1 capsule (20 mg total) by mouth daily.   90 capsule   3      Review of Systems: Gen:  Denies  fever, sweats, chills HEENT: Denies blurred vision, double vision, ear pain, eye pain, hearing loss, nose bleeds, sore throat Cvc:  No dizziness, chest pain or heaviness Resp:   Mild productive cough in the mornings mainly (this is chronic), mild shortness of breath with exertion Gi: Denies swallowing difficulty, stomach pain, nausea or vomiting, diarrhea, constipation, bowel incontinence Gu:  Denies bladder incontinence, burning urine Ext:   No Joint pain, stiffness or swelling Skin: No skin rash, easy bruising or bleeding or hives Endoc:  No polyuria, polydipsia , polyphagia or weight change Psych: No depression, insomnia or hallucinations  Other:  All other systems negative  Allergies:   Vicodin  Physical Examination:  VS: BP 132/76  Pulse 80  Ht 5' 4"  (1.626 m)  Wt 180 lb (81.647 kg)  BMI 30.88 kg/m2  SpO2 97%  General Appearance: No distress  Neuro: EXAM: without focal findings, mental status, speech normal, alert and oriented, cranial nerves 2-12 grossly normal  HEENT: PERRLA, EOM intact, no ptosis, no other lesions noticed Pulmonary:Exam: Good airway entry, no adventitious breath sounds, no rales, no rhonchi, no crackles, no wheezes, patient does have mild decreased breath sounds at the bilateral bases. Sputum production: None Cardiovascular:@ Exam:  Normal S1,S2.  No m/r/g.     Abdomen:Exam: Benign, Soft, non-tender, No masses  Skin:   warm, no rashes, no ecchymosis  Extremities: normal, no cyanosis, clubbing, no edema, warm with normal capillary refill.   Labs results:  BMP Lab Results  Component Value Date   NA 135 02/21/2014   K 4.1 02/21/2014   CL 96 02/21/2014   CO2 29 02/21/2014   GLUCOSE 145* 02/21/2014   BUN 12 02/21/2014   CREATININE 1.0 02/21/2014     CBC CBC Latest Ref Rng 11/28/2013 04/13/2013 09/08/2011  WBC 4.0 - 10.5 K/uL 8.2 9.4 11.0(H)  Hemoglobin 12.0 - 15.0 g/dL 12.9 13.0 14.1  Hematocrit 36.0 - 46.0 % 38.2 38.1 39.8  Platelets 150.0 - 400.0 K/uL  253.0 267.0 301     Rad results:    CT chest 03/08/2014 NDINGS: Mild right apical pleural parenchymal scarring is present. No focal infiltrate or effusion is seen. No suspicious lung nodule or mass is noted. There is surgical absence of the posterior left eighth rib with adjacent sutures. Mild pleural thickening is present within the posterior left lower hemi thorax. There is a thin metallic structure anterior to the esophagus and posterior to the heart within the mediastinal fat of uncertain significance and clinical correlation is recommended. Compared to the chest x-ray from 2013, this thin metallic fragment was present at that time and is unchanged in position.  The pulmonary arteries  are faintly opacify with no significant abnormality evident. The thoracic aorta is well opacified with no acute abnormality noted. Atheromatous change is present within the aortic arch and descending thoracic aorta. The origins of the great vessels are patent. The thyroid gland is normal in size and symmetrical. No mediastinal or hilar adenopathy is seen with only small precarinal nodes present. The largest precarinal node measures 9 mm in diameter. The heart is within upper limits of normal. Incidental imaging of the upper abdomen shows no significant abnormality. There are degenerative changes diffusely throughout the thoracic spine.  IMPRESSION: 1. No explanation for the patient's shortness of breath is seen. No infiltrate or nodule is noted. 2. Thin metallic fragment posterior to the heart within the middle mediastinal fat of questionable significance. Correlate clinically. This fragment is unchanged in position compared to lateral chest x-ray from 04/21/2012.   Assessment and Plan: Pulmonary disease Right apical pleural scarring, this is most likely a sequelae of her current emphysematous disease. Could also be related to her previous surgery when she was a teenager. Tiny metallic object seen on CAT scan posterior left heart border-review of chest x-ray from 2013 and review of CT scan from 2001 showed that this object has been there; this is most likely a residual sequelae of her surgery when she was a teenager.    Asthma, intermittent Patient most likely has asthma with COPD. Currently well-controlled when necessary albuterol. Recent PFT show moderate obstruction with decrease in FEV1-patient was started on Spiriva.   SOB (shortness of breath) Multifactorial: Deconditioning, obesity, tobacco abuse, asthma/COPD   Plan Started on Spiriva Weight loss and healthy eating and exercise Patient counseled on tobacco cessation Plan as stated above asthma    Extrinsic  asthma, unspecified The patient carries a diagnosis of asthma. Based on her symptoms and use of rescue medications she has mild intermittent asthma, extrinsic type, she is also noted to have an obstructive defect on her recent pulmonary function testing.  Plan Albuterol as needed Patient started on Spiriva     DOE (dyspnea on exertion) Multifactorial: deconditioning, obesity, asthma, tobacco abuse  PLAN: Increase exercise, start walking 20-30 minutes a day as tolerated Healthy weight loss and diet control Continue with Spiriva and when necessary albuterol Patient counseled on tobacco abuse    COPD type B Patient with exacerbation or need for steroids in the past 1-2 years. COPD B, Moderate airflow limitation (GOLD STAGE I)  Plan -Continue with when necessary albuterol Patient started on Spiriva today (administration, drug use, side effects of explained to patient).  Tobacco abuse counseling Tobacco Cessation - Counseling regarding benefits of smoking cessation strategies was provided for more than 12 min. - Educated that at this time smoking- cessation represents the single most important step that patient can take to enhance the length and quality of  live. - Educated patient regarding alternatives of behavior interventions, pharmacotherapy including NRT and non-nicotine therapy such, and combinations of both. - Patient at this time: Patient with chronic alone, currently smoking 1.5 packs per day, discussed a set quit date - Quit date: Jul 06 2014    Updated Medication List Outpatient Encounter Prescriptions as of 04/04/2014  Medication Sig  . albuterol (PROVENTIL HFA;VENTOLIN HFA) 108 (90 BASE) MCG/ACT inhaler Inhale 2 puffs into the lungs every 6 (six) hours as needed for wheezing.  Marland Kitchen ALPRAZolam (XANAX) 0.25 MG tablet Take 1 tablet (0.25 mg total) by mouth as needed for sleep.  Marland Kitchen atenolol (TENORMIN) 50 MG tablet Take 1 tablet (50 mg total) by mouth 2 (two) times daily.  Marland Kitchen  atorvastatin (LIPITOR) 80 MG tablet Take 1 tablet (80 mg total) by mouth daily.  . Blood Glucose Monitoring Suppl (ONE TOUCH ULTRA SYSTEM KIT) W/DEVICE KIT Pt needs OneTouch Ultra Blue glucometer dx 250.02  . glipiZIDE (GLUCOTROL) 5 MG tablet 5 mg. Take 1 tablet at breakfast and 1 tablets at dinner  . glucose blood (ONE TOUCH ULTRA TEST) test strip Use as instructed bid, has OneTouch Ultra Blue  dx: 250.02  . hydrochlorothiazide (HYDRODIURIL) 12.5 MG tablet Take 1 tablet (12.5 mg total) by mouth daily.  . Insulin Detemir (LEVEMIR) 100 UNIT/ML Pen Inject 10-13 Units into the skin 2 (two) times daily. 10 units in the morning, 13 units at night  . Lancets (ONETOUCH ULTRASOFT) lancets Use as instructed bid dx 250.02  . meloxicam (MOBIC) 7.5 MG tablet TAKE ONE TABLET BY MOUTH ONCE DAILY  . metFORMIN (GLUCOPHAGE) 1000 MG tablet Take 1 tablet (1,000 mg total) by mouth 2 (two) times daily with a meal.  . omeprazole (PRILOSEC) 20 MG capsule Take 1 capsule (20 mg total) by mouth daily.    Orders for this visit: No orders of the defined types were placed in this encounter.    Thank  you for the visitation and for allowing  [facility] to assist in the care of your patient. Our recommendations are noted above.  Please contact us if we can be of further service.  Vilinda Boehringer, MD [facility] Office Number: [phone]

## 2014-04-04 NOTE — Assessment & Plan Note (Signed)
Multifactorial: Deconditioning, obesity, tobacco abuse, asthma/COPD   Plan Started on Spiriva Weight loss and healthy eating and exercise Patient counseled on tobacco cessation Plan as stated above asthma

## 2014-04-04 NOTE — Assessment & Plan Note (Signed)
Tobacco Cessation - Counseling regarding benefits of smoking cessation strategies was provided for more than 12 min. - Educated that at this time smoking- cessation represents the single most important step that patient can take to enhance the length and quality of live. - Educated patient regarding alternatives of behavior interventions, pharmacotherapy including NRT and non-nicotine therapy such, and combinations of both. - Patient at this time: Patient with chronic alone, currently smoking 1.5 packs per day, discussed a set quit date - Quit date: Jul 06 2014

## 2014-04-04 NOTE — Patient Instructions (Signed)
Begin using Spiriva 1 capsule inhaled daily. We will follow up in 4 months, call us sooner if needed.

## 2014-05-07 ENCOUNTER — Other Ambulatory Visit: Payer: Self-pay | Admitting: *Deleted

## 2014-05-07 NOTE — Telephone Encounter (Signed)
Ok refill, last visit 8/7/5, has appt scheduled 05/18/14?

## 2014-05-09 MED ORDER — ALPRAZOLAM 0.25 MG PO TABS: 0.2500 mg | ORAL_TABLET | ORAL | Status: DC | PRN

## 2014-05-09 NOTE — Telephone Encounter (Signed)
Ok to refill,  printed rx  

## 2014-05-09 NOTE — Telephone Encounter (Signed)
Called to pharmacy 

## 2014-05-14 ENCOUNTER — Encounter: Payer: Self-pay | Admitting: Internal Medicine

## 2014-05-14 ENCOUNTER — Ambulatory Visit (INDEPENDENT_AMBULATORY_CARE_PROVIDER_SITE_OTHER): Payer: Medicare Other | Admitting: Internal Medicine

## 2014-05-14 VITALS — BP 132/80 | HR 88 | Temp 98.5°F | Wt 175.0 lb

## 2014-05-14 DIAGNOSIS — J441 Chronic obstructive pulmonary disease with (acute) exacerbation: Secondary | ICD-10-CM

## 2014-05-14 MED ORDER — AZITHROMYCIN 250 MG PO TABS
ORAL_TABLET | ORAL | Status: DC
Start: 2014-05-14 — End: 2014-08-01

## 2014-05-14 MED ORDER — TIOTROPIUM BROMIDE MONOHYDRATE 18 MCG IN CAPS: 18.0000 ug | ORAL_CAPSULE | Freq: Every day | RESPIRATORY_TRACT | Status: DC

## 2014-05-14 MED ORDER — PREDNISONE 10 MG PO TABS: ORAL_TABLET | ORAL | Status: DC

## 2014-05-14 NOTE — Progress Notes (Signed)
HPI:  Maria Hinton is a 68 yr old female being seen today for productive cough and feeling bad. She has been sick for 5 days with cough, chills, fatigue, and chest congestion. She reports she is coughing up yellow sputum, which is worse in the am and pm. She does endorse shortness of breath and wheezing. Denies chest pain. She has tried OTC Nyquil with little relief. She continues to smoke. She has been using her Albuterol inhaler frequently. She is suppose to follow up with pulmonologist this week.    Past Medical History  Diagnosis Date  . PONV (postoperative nausea and vomiting)   . Hypertension     takes Metoprolol/HCTZ daily  . Hyperlipidemia     takes Pravastatin daily  . Asthma   . Pneumonia     x 2 ;in Dec 2010/2011  . Arthritis   . Joint swelling   . Joint pain     fingers  . Chronic back pain     DDD,herniated disc/spondylosis/radiculopathy  . GERD (gastroesophageal reflux disease)     takes Omeprazole daily  . Hemorrhoids   . History of colonic polyps     colonoscopy 04/2012 - Dr Candace Cruise  . Diabetes mellitus   . Early cataracts, bilateral     Dr. Ruthine Dose - [facility]  . Depression     after death of husband;takes Ativan prn    Current Outpatient Prescriptions  Medication Sig Dispense Refill  . albuterol (PROVENTIL HFA;VENTOLIN HFA) 108 (90 BASE) MCG/ACT inhaler Inhale 2 puffs into the lungs every 6 (six) hours as needed for wheezing.    Marland Kitchen ALPRAZolam (XANAX) 0.25 MG tablet Take 1 tablet (0.25 mg total) by mouth as needed for sleep. 30 tablet 0  . atenolol (TENORMIN) 50 MG tablet Take 1 tablet (50 mg total) by mouth 2 (two) times daily. 180 tablet 3  . atorvastatin (LIPITOR) 40 MG tablet     . atorvastatin (LIPITOR) 80 MG tablet Take 1 tablet (80 mg total) by mouth daily. 30 tablet 6  . Blood Glucose Monitoring Suppl (ONE TOUCH ULTRA SYSTEM KIT) W/DEVICE KIT Pt needs OneTouch Ultra Blue glucometer dx 250.02 1 each 0  . glipiZIDE (GLUCOTROL) 5 MG tablet 5 mg.  Take 1 tablet at breakfast and 1 tablets at dinner    . glucose blood (ONE TOUCH ULTRA TEST) test strip Use as instructed bid, has OneTouch Ultra Blue  dx: 250.02 100 each 3  . hydrochlorothiazide (HYDRODIURIL) 12.5 MG tablet Take 1 tablet (12.5 mg total) by mouth daily. 30 tablet 6  . Insulin Detemir (LEVEMIR) 100 UNIT/ML Pen Inject 10-13 Units into the skin 2 (two) times daily. 10 units in the morning, 13 units at night    . Lancets (ONETOUCH ULTRASOFT) lancets Use as instructed bid dx 250.02 100 each 3  . meloxicam (MOBIC) 7.5 MG tablet TAKE ONE TABLET BY MOUTH ONCE DAILY 30 tablet 6  . metFORMIN (GLUCOPHAGE) 1000 MG tablet Take 1 tablet (1,000 mg total) by mouth 2 (two) times daily with a meal. 180 tablet 3  . omeprazole (PRILOSEC) 20 MG capsule Take 1 capsule (20 mg total) by mouth daily. 90 capsule 3   No current facility-administered medications for this visit.    Allergies  Allergen Reactions  . Vicodin [Hydrocodone-Acetaminophen] Other (See Comments)    Insomnia     Family History  Problem Relation Age of Onset  . Anesthesia problems Neg Hx   . Hypotension Neg Hx   . Malignant hyperthermia Neg Hx   .  Pseudochol deficiency Neg Hx   . Heart disease Brother     valve replacement  . COPD Brother   . COPD Mother     Emphysema  . Diabetes Mother   . Heart disease Father   . Seizures Father   . Cancer Father     Lung cancer with brain metastasis  . Asthma Brother   . COPD Son 82    History   Social History  . Marital Status: Widowed    Spouse Name: N/A    Number of Children: N/A  . Years of Education: N/A   Occupational History  . Not on file.   Social History Main Topics  . Smoking status: Current Every Day Smoker -- 1.50 packs/day for 20 years    Types: Cigarettes  . Smokeless tobacco: Never Used     Comment: Pt had quit smoking for 13 years before starting back in 2010  . Alcohol Use: No  . Drug Use: No  . Sexual Activity: No   Other Topics Concern  .  Not on file   Social History Narrative    ROS:  Constitutional: Endorses fever, chills, fatigue.   HEENT: Denies eye pain, eye redness, ear pain, ringing in the ears, wax buildup, runny nose, nasal congestion, bloody nose, or sore throat. Respiratory: Endorses shortness of breath, cough, and sputum production.    Cardiovascular: Denies chest pain, chest tightness, palpitations or swelling in the hands or feet.   No other specific complaints in a complete review of systems (except as listed in HPI above).  PE:  BP 132/80 mmHg  Pulse 88  Temp(Src) 98.5 F (36.9 C) (Oral)  Wt 175 lb (79.379 kg)  SpO2 98% Wt Readings from Last 3 Encounters:  05/14/14 175 lb (79.379 kg)  04/04/14 180 lb (81.647 kg)  02/21/14 177 lb (80.287 kg)    General: Appears their stated age, well developed, well nourished in NAD. HEENT: Head: normal shape and size; Eyes: sclera white, no icterus, conjunctiva pink, PERRLA and EOMs intact; Ears: Tm's gray and intact, normal light reflex; Nose: mucosa pink and moist, septum midline; Throat/Mouth: Teeth present, mucosa pink and moist, no lesions or ulcerations noted.   Cardiovascular: Normal rate and rhythm. S1,S2 noted.  No murmur, rubs or gallops noted. No JVD or BLE edema. No carotid bruits noted. Pulmonary/Chest: Bilateral wheezing throughout lung fields. Mild crackles to upper lobes.  Psychiatric: Mood and affect normal. Behavior is normal. Judgment and thought content normal.   EKG:  BMET    Component Value Date/Time   NA 135 02/21/2014 1425   K 4.1 02/21/2014 1425   CL 96 02/21/2014 1425   CO2 29 02/21/2014 1425   GLUCOSE 145* 02/21/2014 1425   BUN 12 02/21/2014 1425   CREATININE 1.0 02/21/2014 1425   CALCIUM 9.0 02/21/2014 1425   GFRNONAA 88* 09/08/2011 1451   GFRAA >90 09/08/2011 1451    Lipid Panel     Component Value Date/Time   CHOL 143 10/26/2013 0833   TRIG 184.0* 10/26/2013 0833   HDL 41.60 10/26/2013 0833   CHOLHDL 3 10/26/2013  0833   VLDL 36.8 10/26/2013 0833   LDLCALC 65 10/26/2013 0833    CBC    Component Value Date/Time   WBC 8.2 11/28/2013 0849   RBC 4.06 11/28/2013 0849   HGB 12.9 11/28/2013 0849   HCT 38.2 11/28/2013 0849   PLT 253.0 11/28/2013 0849   MCV 94.0 11/28/2013 0849   MCH 31.3 09/08/2011 1451   MCHC  33.8 11/28/2013 0849   RDW 12.7 11/28/2013 0849   LYMPHSABS 1.7 11/28/2013 0849   MONOABS 0.5 11/28/2013 0849   EOSABS 0.7 11/28/2013 0849   BASOSABS 0.0 11/28/2013 0849    Hgb A1C Lab Results  Component Value Date   HGBA1C 7.8* 02/09/2014     Assessment and Plan:  Acute Bronchitis Rx Zpack for 5 days Rx Pred pack for 5 days, tapered dose Continue to use inhaler as needed for wheezing Smoking cessation counseling provided Follow up if symptoms do not improve  ,  S, Student-NP

## 2014-05-14 NOTE — Patient Instructions (Signed)

## 2014-05-14 NOTE — Progress Notes (Signed)
HPI  Pt presents to the clinic today with c/o cough, shortness of breath and wheezing. She reports this started 5 days ago. The cough is productive of thick yellow mucous. The cough seems to be worse at night. She denies fever, chills or body aches. She has tried nyquil with good relief. She does have a history of COPD and asthma. She does take albuterol when needed. She reports that her pulmonologist gave her a sample of Spiriva but never gave her a prescription. She does continue to smoke. She has had sick contacts.  Review of Systems      Past Medical History  Diagnosis Date  . PONV (postoperative nausea and vomiting)   . Hypertension     takes Metoprolol/HCTZ daily  . Hyperlipidemia     takes Pravastatin daily  . Asthma   . Pneumonia     x 2 ;in Dec 2010/2011  . Arthritis   . Joint swelling   . Joint pain     fingers  . Chronic back pain     DDD,herniated disc/spondylosis/radiculopathy  . GERD (gastroesophageal reflux disease)     takes Omeprazole daily  . Hemorrhoids   . History of colonic polyps     colonoscopy 04/2012 - Dr Candace Cruise  . Diabetes mellitus   . Early cataracts, bilateral     Dr. Ruthine Dose - [facility]  . Depression     after death of husband;takes Ativan prn    Family History  Problem Relation Age of Onset  . Anesthesia problems Neg Hx   . Hypotension Neg Hx   . Malignant hyperthermia Neg Hx   . Pseudochol deficiency Neg Hx   . Heart disease Brother     valve replacement  . COPD Brother   . COPD Mother     Emphysema  . Diabetes Mother   . Heart disease Father   . Seizures Father   . Cancer Father     Lung cancer with brain metastasis  . Asthma Brother   . COPD Son 11    History   Social History  . Marital Status: Widowed    Spouse Name: N/A    Number of Children: N/A  . Years of Education: N/A   Occupational History  . Not on file.   Social History Main Topics  . Smoking status: Current Every Day Smoker -- 1.50 packs/day for  20 years    Types: Cigarettes  . Smokeless tobacco: Never Used     Comment: Pt had quit smoking for 13 years before starting back in 2010  . Alcohol Use: No  . Drug Use: No  . Sexual Activity: No   Other Topics Concern  . Not on file   Social History Narrative    Allergies  Allergen Reactions  . Vicodin [Hydrocodone-Acetaminophen] Other (See Comments)    Insomnia      Constitutional: Denies headache, fatigue, fever or abrupt weight changes.  HEENT:  Denies eye redness, eye pain, pressure behind the eyes, facial pain, nasal congestion, ear pain, ringing in the ears, wax buildup, runny nose, bloody nose or sore throat. Respiratory: Positive cough and shortness of breath. Denies difficulty breathing.  Cardiovascular: Denies chest pain, chest tightness, palpitations or swelling in the hands or feet.   No other specific complaints in a complete review of systems (except as listed in HPI above).  Objective:   BP 132/80 mmHg  Pulse 88  Temp(Src) 98.5 F (36.9 C) (Oral)  Wt 175 lb (79.379 kg)  SpO2 98%   Wt Readings from Last 3 Encounters:  05/14/14 175 lb (79.379 kg)  04/04/14 180 lb (81.647 kg)  02/21/14 177 lb (80.287 kg)     General: Appears her stated age, well developed, well nourished in NAD. HEENT: Head: normal shape and size; Ears: Tm's gray and intact, normal light reflex; Nose: mucosa pink and moist, septum midline; Throat/Mouth: Teeth present, mucosa erythematous and moist, no exudate noted, no lesions or ulcerations noted.  Cardiovascular: Normal rate and rhythm. S1,S2 noted.  No murmur, rubs or gallops noted.  Pulmonary/Chest: Normal effort and bilaterally expiratory wheeze noted with scattered rhonci. No respiratory distress.      Assessment & Plan:   COPD exacerbation:  Get some rest and drink plenty of water Do salt water gargles for the sore throat eRx for Azithromax x 5 days eRx for Pred Taper x 6 days Did give her an RX for Spiriva to start use-  please make your pulmonologist aware.  RTC as needed or if symptoms persist.

## 2014-05-14 NOTE — Progress Notes (Signed)
Pre visit review using our clinic review tool, if applicable. No additional management support is needed unless otherwise documented below in the visit note. 

## 2014-05-18 ENCOUNTER — Ambulatory Visit (INDEPENDENT_AMBULATORY_CARE_PROVIDER_SITE_OTHER): Payer: Medicare Other | Admitting: Internal Medicine

## 2014-05-18 ENCOUNTER — Encounter: Payer: Self-pay | Admitting: Internal Medicine

## 2014-05-18 VITALS — BP 154/80 | HR 67 | Temp 98.5°F | Resp 16 | Ht 64.0 in | Wt 173.8 lb

## 2014-05-18 DIAGNOSIS — R519 Headache, unspecified: Secondary | ICD-10-CM

## 2014-05-18 DIAGNOSIS — R51 Headache: Secondary | ICD-10-CM

## 2014-05-18 DIAGNOSIS — J4521 Mild intermittent asthma with (acute) exacerbation: Secondary | ICD-10-CM

## 2014-05-18 DIAGNOSIS — E785 Hyperlipidemia, unspecified: Secondary | ICD-10-CM

## 2014-05-18 DIAGNOSIS — Z716 Tobacco abuse counseling: Secondary | ICD-10-CM

## 2014-05-18 DIAGNOSIS — E1110 Type 2 diabetes mellitus with ketoacidosis without coma: Secondary | ICD-10-CM

## 2014-05-18 DIAGNOSIS — IMO0002 Reserved for concepts with insufficient information to code with codable children: Secondary | ICD-10-CM

## 2014-05-18 DIAGNOSIS — E1310 Other specified diabetes mellitus with ketoacidosis without coma: Secondary | ICD-10-CM

## 2014-05-18 DIAGNOSIS — G44059 Short lasting unilateral neuralgiform headache with conjunctival injection and tearing (SUNCT), not intractable: Secondary | ICD-10-CM

## 2014-05-18 DIAGNOSIS — E1165 Type 2 diabetes mellitus with hyperglycemia: Secondary | ICD-10-CM

## 2014-05-18 LAB — MICROALBUMIN / CREATININE URINE RATIO
Creatinine,U: 181.4 mg/dL
Microalb Creat Ratio: 0.6 mg/g (ref 0.0–30.0)
Microalb, Ur: 1.1 mg/dL (ref 0.0–1.9)

## 2014-05-18 LAB — LIPID PANEL
Cholesterol: 156 mg/dL (ref 0–200)
HDL: 43.9 mg/dL (ref >39.00)
LDL Cholesterol: 75 mg/dL (ref 0–99)
NonHDL: 112.1
Total CHOL/HDL Ratio: 4
Triglycerides: 186 mg/dL — ABNORMAL HIGH (ref 0.0–149.0)
VLDL: 37.2 mg/dL (ref 0.0–40.0)

## 2014-05-18 LAB — COMPREHENSIVE METABOLIC PANEL
ALT: 19 U/L (ref 0–35)
AST: 17 U/L (ref 0–37)
Albumin: 3.7 g/dL (ref 3.5–5.2)
Alkaline Phosphatase: 96 U/L (ref 39–117)
BUN: 19 mg/dL (ref 6–23)
CO2: 26 mEq/L (ref 19–32)
Calcium: 9.1 mg/dL (ref 8.4–10.5)
Chloride: 101 mEq/L (ref 96–112)
Creatinine, Ser: 1 mg/dL (ref 0.4–1.2)
GFR: 58.24 mL/min — ABNORMAL LOW (ref >60.00)
Glucose, Bld: 95 mg/dL (ref 70–99)
Potassium: 3.7 mEq/L (ref 3.5–5.1)
Sodium: 141 mEq/L (ref 135–145)
Total Bilirubin: 0.5 mg/dL (ref 0.2–1.2)
Total Protein: 7.4 g/dL (ref 6.0–8.3)

## 2014-05-18 LAB — HEMOGLOBIN A1C: Hgb A1c MFr Bld: 7 % — ABNORMAL HIGH (ref 4.6–6.5)

## 2014-05-18 LAB — SEDIMENTATION RATE: Sed Rate: 9 mm/hr (ref 0–22)

## 2014-05-18 LAB — C-REACTIVE PROTEIN: CRP: <0.5 mg/dL (ref 0.5–20.0)

## 2014-05-18 MED ORDER — ALBUTEROL SULFATE (2.5 MG/3ML) 0.083% IN NEBU
2.5000 mg | INHALATION_SOLUTION | Freq: Once | RESPIRATORY_TRACT | Status: AC
  Administered 2014-05-18: 2.5 mg via RESPIRATORY_TRACT

## 2014-05-18 MED ORDER — METHYLPREDNISOLONE ACETATE 40 MG/ML IJ SUSP
40.0000 mg | Freq: Once | INTRAMUSCULAR | Status: AC
  Administered 2014-05-18: 40 mg via INTRAMUSCULAR

## 2014-05-18 MED ORDER — ALBUTEROL SULFATE HFA 108 (90 BASE) MCG/ACT IN AERS: 2.0000 | INHALATION_SPRAY | Freq: Four times a day (QID) | RESPIRATORY_TRACT | Status: DC | PRN

## 2014-05-18 NOTE — Patient Instructions (Addendum)
Take the glipizide 15 minutes before breakfast and before dinner  Increase glipizide to 10 mg before dinner if eveningf sugars are over 150 going forward  Don't skip breakfast  You can increase the meloxicam to 15 mg daiy and add tylenol up to 4 tablets daily  For your hip pain  MRI/MRA of brain to  Rule out  Aneurysm and mass causing your recurrent headaches  PLEASE DO NOT RESUME SMOKING!!!!    This is  my version of a  "Low GI"  Diet:  It will still lower your blood sugars and allow you to lose 4 to 8  lbs  per month if you follow it carefully.  Your goal with exercise is a minimum of 30 minutes of aerobic exercise 5 days per week (Walking does not count once it becomes easy!)     All of the foods can be found at grocery stores and in bulk at [employer].  The Atkins protein bars and shakes are available in more varieties at [employer], [employer] and [employer].     7 AM Breakfast:  Choose from the following:  Low carbohydrate Protein  Shakes (I recommend the EAS AdvantEdge "Carb Control" shakes  Or the low carb shakes by Atkins.    2.5 carbs   Arnold's "Sandwhich Thin"toasted  w/ peanut butter (no jelly: about 20 net carbs  "Bagel Thin" with cream cheese and salmon: about 20 carbs   a scrambled egg/bacon/cheese burrito made with Mission's "carb balance" whole wheat tortilla  (about 10 net carbs )  A slice of home made fritatta (egg based dish without a crust:  google it)    Avoid cereal and bananas, oatmeal and cream of wheat and grits. They are loaded with carbohydrates!   10 AM: high protein snack  Protein bar by Atkins (the snack size, under 200 cal, usually < 6 net carbs).    A stick of cheese:  Around 1 carb,  100 cal     Dannon Light n Fit [place] Yogurt  (80 cal, 8 carbs)  Other so called "protein bars" and Greek yogurts tend to be loaded with carbohydrates.  Remember, in food advertising, the word "energy" is synonymous for " carbohydrate."  Lunch:   A Sandwich using the bread  choices listed, Can use any  Eggs,  lunchmeat, grilled meat or canned tuna), avocado, regular mayo/mustard  and cheese.  A Salad using blue cheese, ranch,  Goddess or vinagrette,  No croutons or "confetti" and no "candied nuts" but regular nuts OK.   No pretzels or chips.  Pickles and miniature sweet peppers are a good low carb alternative that provide a "crunch"  The bread is the only source of carbohydrate in a sandwich and  can be decreased by trying some of these alternatives to traditional loaf bread  Joseph's makes a pita bread and a flat bread that are 50 cal and 4 net carbs available at [place] and [place].  This can be toasted to use with hummous as well  Toufayan makes a low carb flatbread that's 100 cal and 9 net carbs available at [employer] and [employer]J's makes 2 sizes of  Low carb whole wheat tortilla  (The large one is 210 cal and 6 net carbs)  Flat Out makes flatbreads that are low carb as well  Avoid "Low fat dressings, as well as Barry Brunner and [place] dressings They are loaded with sugar!   3 PM/ Mid day  Snack:  Consider  1 ounce of  almonds, walnuts, pistachios, pecans, peanuts,  Macadamia nuts or a nut medley.  Avoid "granola"; the dried cranberries and raisins are loaded with carbohydrates. Mixed nuts as long as there are no raisins,  cranberries or dried fruit.    Try the prosciutto/mozzarella cheese sticks by Fiorruci  In deli /backery section   High protein   To avoid overindulging in snacks: Try drinking a glass of unsweeted almond/coconut milk  Or a cup of coffee with your Atkins chocolate bar to keep you from having 3!!!   Pork rinds!  Yes Pork Rinds        6 PM  Dinner:     Meat/fowl/fish with a green salad, and either broccoli, cauliflower, green beans, spinach, brussel sprouts or  Lima beans. DO NOT BREAD THE PROTEIN!!      There is a low carb pasta by Dreamfield's that is acceptable and tastes great: only 5 digestible carbs/serving.( All grocery stores but  BJs carry it )  Try Hurley Cisco Angelo's chicken piccata or chicken or eggplant parm over low carb pasta.(Lowes and BJs)   Marjory Lies Sanchez's "Carnitas" (pulled pork, no sauce,  0 carbs) or his beef pot roast to make a dinner burrito (at BJ's)  Pesto over low carb pasta (bj's sells a good quality pesto in the center refrigerated section of the deli   Try satueeing  Cheral Marker with mushroooms  Whole wheat pasta is still full of digestible carbs and  Not as low in glycemic index as Dreamfield's.   Brown rice is still rice,  So skip the rice and noodles if you eat [place] or [place] (or at least limit to 1/2 cup)  9 PM snack :   Breyer's "low carb" fudgsicle or  ice cream bar (Carb Smart line), or  Weight Watcher's ice cream bar , or another "no sugar added" ice cream;  a serving of fresh berries/cherries with whipped cream   Cheese or DANNON'S LlGHT N FIT GREEK YOGURT or the Oikos greek yogurt   8 ounces of [place] unsweetened almond/cococunut milk  Cheese and crackers (using WASA crackers,  They are low carb) or peanut butter on low carb crackers or pita bread     Avoid bananas, pineapple, grapes  and watermelon on a regular basis because they are high in sugar.  THINK OF THEM AS DESSERT  Remember that snack Substitutions should be less than 10 NET carbs per serving and meals should be < 25 net carbs. Remember that carbohydrates from fiber do not affect blood sugar, so you can  subtract fiber grams to get the "net carbs " of any particular food item.

## 2014-05-18 NOTE — Progress Notes (Signed)
Pre-visit discussion using our clinic review tool. No additional management support is needed unless otherwise documented below in the visit note.  

## 2014-05-18 NOTE — Progress Notes (Signed)
Maria Hinton, female   DOB: Mar 18, 1946, 68 y.o.   MRN: [id number]   Patient Active Problem List   Diagnosis Date Noted  . Headache , short unilat neuralgiform, w/conjunctival injection/tearing 05/20/2014  . COPD type B 04/04/2014  . SOB (shortness of breath) 02/21/2014  . Extrinsic asthma, unspecified 02/21/2014  . DOE (dyspnea on exertion) 02/21/2014  . Tobacco abuse disorder 02/21/2014  . Need for Tdap vaccination 02/09/2014  . Need for prophylactic vaccination against Streptococcus pneumoniae (pneumococcus) 02/09/2014  . Tobacco abuse counseling 02/09/2014  . Screening for osteoporosis 02/09/2014  . Pulmonary disease 02/09/2014  . Fatigue 11/28/2013  . Left shoulder pain 07/28/2013  . DM (diabetes mellitus), type 2, uncontrolled 04/27/2013  . Encounter for smoking cessation counseling 04/27/2013  . Arthritis 04/27/2013  . HTN (hypertension) 04/11/2013  . HLD (hyperlipidemia) 04/11/2013  . Asthma, intermittent 04/11/2013    Subjective:  CC:   Chief Complaint  Patient presents with  . Follow-up    Seen on Monday at [place] office for cough and congestion prescibed ABX, prednisone, Spirivia.  . Diabetes    C/o right hip pain runs to groin down leg.    HPI:   Maria Hinton is a 68 y.o. female who presents for follow up on multiple issues:  1) COPD EXACERBATION.  Treated on Monday,  Still noticing chest tightness, still smoking but down to 1 cigarette daily  2) DM:  Checks her sugars one to two times daily .  BS 65 to 99 fasting, Nighttime 150 to 170 Current elevations to 300 due to prednisone .  Using Glipizide, metformin and levemir bid  10/13   3) DJD:  Had a Left hip replacement last year   Now right hip is hurting with prolonged sitting   4) Since the summer she has been having recurrent episodes of severe left parietal pain which lasting as long as 20 minutes,  Accompanied by a feeling of the left eye drawing up.  Pain shoots into the left  temple and   Left  Maxillary area .  Has recurred 5 or 6 times ,  Left eye gets blurry  Last episodes  2 weeks ago.  Vison in her left eye still blurry.  Last eye exam normal march or April by Dr Rhae Hammock [facility] (dilated)   A total of 40 minutes was spent with patient more than half of which was spent in counseling patient on the above mentioned issues ,  and coordination of care.    Past Medical History  Diagnosis Date  . PONV (postoperative nausea and vomiting)   . Hypertension     takes Metoprolol/HCTZ daily  . Hyperlipidemia     takes Pravastatin daily  . Asthma   . Pneumonia     x 2 ;in Dec 2010/2011  . Arthritis   . Joint swelling   . Joint pain     fingers  . Chronic back pain     DDD,herniated disc/spondylosis/radiculopathy  . GERD (gastroesophageal reflux disease)     takes Omeprazole daily  . Hemorrhoids   . History of colonic polyps     colonoscopy 04/2012 - Dr Candace Cruise  . Diabetes mellitus   . Early cataracts, bilateral     Dr. Ruthine Dose - [facility]  . Depression     after death of husband;takes Ativan prn    Past Surgical History  Procedure Laterality Date  . Thoracic disc surgery  at age 47 and 74  mass of veins that had gotten into muscle and wrapped around rib;3 ribs also removed from left side  . Abdominal hysterectomy      at age 4  . Back surgery  20+yrs ago  . Foot surgery  2011    bunionectomy and knot removed from bottom of both feet  . Melanoma excision  2011    on nose/back on neck  . Vagina surgery      vaginal wall ruptured   . Tonsillectomy  as a child    and adenoids   . Colonoscopy    . Esophagogastroduodenoscopy  2011  . Tubal ligation    . Lumbar laminectomy/decompression microdiscectomy  09/10/2011    Procedure: LUMBAR LAMINECTOMY/DECOMPRESSION MICRODISCECTOMY 1 LEVEL;  Surgeon: Hosie Spangle, MD;  Location: [facility];  Service: Neurosurgery;  Laterality: Right;  RIGHT Lumbar  Laminotomy and microdiskectomy Lumbar Four-Five  .  Total hip arthroplasty Left 2014    Dr. Mack Guise       The following portions of the patient's history were reviewed and updated as appropriate: Allergies, current medications, and problem list.    Review of Systems:   Patient denies headache, fevers, malaise, unintentional weight loss, skin rash, eye pain, sinus congestion and sinus pain, sore throat, dysphagia,  hemoptysis , cough, dyspnea, wheezing, chest pain, palpitations, orthopnea, edema, abdominal pain, nausea, melena, diarrhea, constipation, flank pain, dysuria, hematuria, urinary  Frequency, nocturia, numbness, tingling, seizures,  Focal weakness, Loss of consciousness,  Tremor, insomnia, depression, anxiety, and suicidal ideation.     History   Social History  . Marital Status: Widowed    Spouse Name: N/A    Number of Children: N/A  . Years of Education: N/A   Occupational History  . Not on file.   Social History Main Topics  . Smoking status: Current Every Day Smoker -- 1.50 packs/day for 20 years    Types: Cigarettes  . Smokeless tobacco: Never Used     Comment: Pt had quit smoking for 13 years before starting back in 2010  . Alcohol Use: No  . Drug Use: No  . Sexual Activity: No   Other Topics Concern  . Not on file   Social History Narrative    Objective:  Filed Vitals:   05/18/14 0806  BP: 154/80  Pulse: 67  Temp: 98.5 F (36.9 C)  Resp: 16     General appearance: alert, cooperative and appears stated age Ears: normal TM's and external ear canals both ears Throat: lips, mucosa, and tongue normal; teeth and gums normal Neck: no adenopathy, no carotid bruit, supple, symmetrical, trachea midline and thyroid not enlarged, symmetric, no tenderness/mass/nodules Back: symmetric, no curvature. ROM normal. No CVA tenderness. Lungs: clear to auscultation bilaterally Heart: regular rate and rhythm, S1, S2 normal, no murmur, click, rub or gallop Abdomen: soft, non-tender; bowel sounds normal; no  masses,  no organomegaly Pulses: 2+ and symmetric Skin: Skin color, texture, turgor normal. No rashes or lesions Lymph nodes: Cervical, supraclavicular, and axillary nodes normal.  Assessment and Plan:  DM (diabetes mellitus), type 2, uncontrolled Now  well-controlled on current medications by current hemoglobin A1c is 7.0 but sugars are elevated post prandally, glipizide dose increased and timing of medication as well as need for low GI diet reviewed . Patient is up-to-date on eye exams and foot exam is normal today. ..  Foot exam done. Meds reviewed and she is on a baby aspirin daily., a statin and an ACE inhibitor.  Urine tested for protein.  Lab Results  Component Value Date   HGBA1C 7.0* 05/18/2014   Lab Results  Component Value Date   MICROALBUR 1.1 05/18/2014   Lab Results  Component Value Date   CHOL 156 05/18/2014   HDL 43.90 05/18/2014   LDLCALC 75 05/18/2014   LDLDIRECT 106.8 07/28/2013   TRIG 186.0* 05/18/2014   CHOLHDL 4 05/18/2014      HLD (hyperlipidemia) Well controlled on current statin therapy.   Liver enzymes are normal , no changes today.  Lab Results  Component Value Date   CHOL 156 05/18/2014   HDL 43.90 05/18/2014   LDLCALC 75 05/18/2014   LDLDIRECT 106.8 07/28/2013   TRIG 186.0* 05/18/2014   CHOLHDL 4 05/18/2014    Lab Results  Component Value Date   ALT 19 05/18/2014   AST 17 05/18/2014   ALKPHOS 96 05/18/2014   BILITOT 0.5 05/18/2014     Tobacco abuse counseling Patient's risk of progressive PAD outlined in detail today given her concurrent history of DM. Over 3 minutes spent advising patient to quit and offering assistance.   Headache , short unilat neuralgiform, w/conjunctival injection/tearing Recurrent over the past several months.  New onset in a patient > 65 with long tobacco history . Need to rule out mass and aneurysm.  MRA/MRi ordered.    Updated Medication List Outpatient Encounter Prescriptions as of 05/18/2014   Medication Sig  . albuterol (PROVENTIL HFA;VENTOLIN HFA) 108 (90 BASE) MCG/ACT inhaler Inhale 2 puffs into the lungs every 6 (six) hours as needed for wheezing.  Marland Kitchen ALPRAZolam (XANAX) 0.25 MG tablet Take 1 tablet (0.25 mg total) by mouth as needed for sleep.  Marland Kitchen atenolol (TENORMIN) 50 MG tablet Take 1 tablet (50 mg total) by mouth 2 (two) times daily.  Marland Kitchen atorvastatin (LIPITOR) 40 MG tablet   . azithromycin (ZITHROMAX) 250 MG tablet Take 2 tabs today, then 1 tab daily x 4 days  . Blood Glucose Monitoring Suppl (ONE TOUCH ULTRA SYSTEM KIT) W/DEVICE KIT Pt needs OneTouch Ultra Blue glucometer dx 250.02  . glipiZIDE (GLUCOTROL) 5 MG tablet 5 mg. Take 1 tablet at breakfast and 1 tablets at dinner  . glucose blood (ONE TOUCH ULTRA TEST) test strip Use as instructed bid, has OneTouch Ultra Blue  dx: 250.02  . hydrochlorothiazide (HYDRODIURIL) 12.5 MG tablet Take 1 tablet (12.5 mg total) by mouth daily.  . Insulin Detemir (LEVEMIR) 100 UNIT/ML Pen Inject 10-13 Units into the skin 2 (two) times daily. 10 units in the morning, 13 units at night  . Lancets (ONETOUCH ULTRASOFT) lancets Use as instructed bid dx 250.02  . meloxicam (MOBIC) 7.5 MG tablet TAKE ONE TABLET BY MOUTH ONCE DAILY  . metFORMIN (GLUCOPHAGE) 1000 MG tablet Take 1 tablet (1,000 mg total) by mouth 2 (two) times daily with a meal.  . omeprazole (PRILOSEC) 20 MG capsule Take 1 capsule (20 mg total) by mouth daily.  . predniSONE (DELTASONE) 10 MG tablet Take 3 tabs on days 1-2, take 2 tabs on days 3-4, take 1 tab on days 5-6  . tiotropium (SPIRIVA) 18 MCG inhalation capsule Place 1 capsule (18 mcg total) into inhaler and inhale daily.  . [DISCONTINUED] albuterol (PROVENTIL HFA;VENTOLIN HFA) 108 (90 BASE) MCG/ACT inhaler Inhale 2 puffs into the lungs every 6 (six) hours as needed for wheezing.  . [DISCONTINUED] atorvastatin (LIPITOR) 80 MG tablet Take 1 tablet (80 mg total) by mouth daily.  . [EXPIRED] albuterol (PROVENTIL) (2.5 MG/3ML) 0.083%  nebulizer solution 2.5 mg   . [EXPIRED] methylPREDNISolone  acetate (DEPO-MEDROL) injection 40 mg      Orders Placed This Encounter  Procedures  . MR MRA Head/Brain Wo Cm  . Comprehensive metabolic panel  . Lipid panel  . Microalbumin / creatinine urine ratio  . Hemoglobin A1c  . Sedimentation rate  . C-reactive protein    Return in about 3 months (around 08/18/2014) for follow up diabetes.

## 2014-05-18 NOTE — Progress Notes (Signed)
Patient given Nebulizer treatment with albuterol 0.083% last 5 min patient encourage to take deep breathes in through mouth out nose. Patient wheezing on inspiration and expiration prior to treatment after treatment notable decrease in wheezing in bilateral  Lungs. Injection of depo-medrol given left deltoid as directed by MD.

## 2014-05-20 ENCOUNTER — Encounter: Payer: Self-pay | Admitting: Internal Medicine

## 2014-05-20 DIAGNOSIS — R51 Headache: Secondary | ICD-10-CM

## 2014-05-20 DIAGNOSIS — R519 Headache, unspecified: Secondary | ICD-10-CM | POA: Insufficient documentation

## 2014-05-20 NOTE — Assessment & Plan Note (Signed)
Recurrent over the past several months.  New onset in a patient > 65 with long tobacco history . Need to rule out mass and aneurysm.  MRA/MRi ordered.

## 2014-05-20 NOTE — Assessment & Plan Note (Signed)
Well controlled on current statin therapy.   Liver enzymes are normal , no changes today.  Lab Results  Component Value Date   CHOL 156 05/18/2014   HDL 43.90 05/18/2014   LDLCALC 75 05/18/2014   LDLDIRECT 106.8 07/28/2013   TRIG 186.0* 05/18/2014   CHOLHDL 4 05/18/2014    Lab Results  Component Value Date   ALT 19 05/18/2014   AST 17 05/18/2014   ALKPHOS 96 05/18/2014   BILITOT 0.5 05/18/2014

## 2014-05-20 NOTE — Assessment & Plan Note (Signed)
Patient's risk of progressive PAD outlined in detail today given her concurrent history of DM. Over 3 minutes spent advising patient to quit and offering assistance.

## 2014-05-20 NOTE — Assessment & Plan Note (Addendum)
Now  well-controlled on current medications by current hemoglobin A1c is 7.0 but sugars are elevated post prandally, glipizide dose increased and timing of medication as well as need for low GI diet reviewed . Patient is up-to-date on eye exams and foot exam is normal today. ..  Foot exam done. Meds reviewed and she is on a baby aspirin daily., a statin and an ACE inhibitor.  Urine tested for protein.   Lab Results  Component Value Date   HGBA1C 7.0* 05/18/2014   Lab Results  Component Value Date   MICROALBUR 1.1 05/18/2014   Lab Results  Component Value Date   CHOL 156 05/18/2014   HDL 43.90 05/18/2014   LDLCALC 75 05/18/2014   LDLDIRECT 106.8 07/28/2013   TRIG 186.0* 05/18/2014   CHOLHDL 4 05/18/2014

## 2014-05-28 ENCOUNTER — Telehealth: Payer: Self-pay | Admitting: Internal Medicine

## 2014-05-28 ENCOUNTER — Ambulatory Visit: Payer: Self-pay | Admitting: Internal Medicine

## 2014-05-28 NOTE — Telephone Encounter (Signed)
Pt notified and verbalized understanding.

## 2014-05-28 NOTE — Telephone Encounter (Signed)
Left message for pt to return my call.

## 2014-05-28 NOTE — Telephone Encounter (Signed)
Her MRI was suggestive of an aneurysm but the CT angiogram ruled it out.  If her headaches are persistent, I will be happy  to refer her to a neurologist for treatment

## 2014-06-06 ENCOUNTER — Encounter: Payer: Self-pay | Admitting: Internal Medicine

## 2014-06-14 ENCOUNTER — Encounter: Payer: Self-pay | Admitting: Internal Medicine

## 2014-06-22 ENCOUNTER — Other Ambulatory Visit: Payer: Self-pay | Admitting: *Deleted

## 2014-06-22 MED ORDER — ATORVASTATIN CALCIUM 40 MG PO TABS: 40.0000 mg | ORAL_TABLET | Freq: Every day | ORAL | Status: DC

## 2014-06-22 MED ORDER — ALPRAZOLAM 0.25 MG PO TABS: 0.2500 mg | ORAL_TABLET | ORAL | Status: DC | PRN

## 2014-06-22 MED ORDER — ATENOLOL 50 MG PO TABS: 50.0000 mg | ORAL_TABLET | Freq: Two times a day (BID) | ORAL | Status: DC

## 2014-06-22 MED ORDER — OMEPRAZOLE 20 MG PO CPDR: 20.0000 mg | DELAYED_RELEASE_CAPSULE | Freq: Every day | ORAL | Status: DC

## 2014-06-22 MED ORDER — GLIPIZIDE 5 MG PO TABS: 5.0000 mg | ORAL_TABLET | Freq: Two times a day (BID) | ORAL | Status: DC

## 2014-06-22 NOTE — Telephone Encounter (Signed)
Ok refill. Last appt 05/18/14

## 2014-06-22 NOTE — Telephone Encounter (Signed)
Called to pharmacy 

## 2014-08-01 ENCOUNTER — Ambulatory Visit (INDEPENDENT_AMBULATORY_CARE_PROVIDER_SITE_OTHER): Payer: Medicare Other | Admitting: Internal Medicine

## 2014-08-01 ENCOUNTER — Encounter: Payer: Self-pay | Admitting: Internal Medicine

## 2014-08-01 VITALS — BP 132/70 | HR 67 | Temp 98.0°F | Wt 174.0 lb

## 2014-08-01 DIAGNOSIS — J441 Chronic obstructive pulmonary disease with (acute) exacerbation: Secondary | ICD-10-CM

## 2014-08-01 MED ORDER — PREDNISONE 10 MG PO TABS: ORAL_TABLET | ORAL | Status: DC

## 2014-08-01 MED ORDER — DOXYCYCLINE HYCLATE 100 MG PO TABS: 100.0000 mg | ORAL_TABLET | Freq: Two times a day (BID) | ORAL | Status: DC

## 2014-08-01 NOTE — Patient Instructions (Signed)

## 2014-08-01 NOTE — Progress Notes (Signed)
HPI  Pt presents to the clinic today with c/o chest congestion, cough and wheezing. She reports this started 1 week ago. The cough is productive of green/yellow mucous. She has had some chest tightness, shortness of breath and fever but denies chest pain. Her fever has been as high as 102. She reports she has taken Nyquil and her inhalers without much relief. She does not have a nebulizer. She does have a history of COPD and asthma. She has not had sick contacts. She does continue to smoke. She is not UTD on her flu shot. Pneumonia shots UTD.   Review of Systems      Past Medical History  Diagnosis Date  . PONV (postoperative nausea and vomiting)   . Hypertension     takes Metoprolol/HCTZ daily  . Hyperlipidemia     takes Pravastatin daily  . Asthma   . Pneumonia     x 2 ;in Dec 2010/2011  . Arthritis   . Joint swelling   . Joint pain     fingers  . Chronic back pain     DDD,herniated disc/spondylosis/radiculopathy  . GERD (gastroesophageal reflux disease)     takes Omeprazole daily  . Hemorrhoids   . History of colonic polyps     colonoscopy 04/2012 - Dr Candace Cruise  . Diabetes mellitus   . Early cataracts, bilateral     Dr. Ruthine Dose - [facility]  . Depression     after death of husband;takes Ativan prn    Family History  Problem Relation Age of Onset  . Anesthesia problems Neg Hx   . Hypotension Neg Hx   . Malignant hyperthermia Neg Hx   . Pseudochol deficiency Neg Hx   . Heart disease Brother     valve replacement  . COPD Brother   . COPD Mother     Emphysema  . Diabetes Mother   . Heart disease Father   . Seizures Father   . Cancer Father     Lung cancer with brain metastasis  . Asthma Brother   . COPD Son 9    History   Social History  . Marital Status: Widowed    Spouse Name: N/A    Number of Children: N/A  . Years of Education: N/A   Occupational History  . Not on file.   Social History Main Topics  . Smoking status: Current Every Day  Smoker -- 1.50 packs/day for 20 years    Types: Cigarettes  . Smokeless tobacco: Never Used     Comment: Pt had quit smoking for 13 years before starting back in 2010  . Alcohol Use: No  . Drug Use: No  . Sexual Activity: No   Other Topics Concern  . Not on file   Social History Narrative    Allergies  Allergen Reactions  . Vicodin [Hydrocodone-Acetaminophen] Other (See Comments)    Insomnia      Constitutional: Positive fatigue and fever. Denies headache, abrupt weight changes.  HEENT:  Positive nasal congestion, sore throat. Denies eye redness, eye pain, pressure behind the eyes, facial pain, ear pain, ringing in the ears, wax buildup, runny nose or bloody nose. Respiratory: Positive cough and shortness of breath. Denies difficulty breathing.  Cardiovascular: Pt reports chest tightness. Denies chest pain, palpitations or swelling in the hands or feet.   No other specific complaints in a complete review of systems (except as listed in HPI above).  Objective:   BP 132/70 mmHg  Pulse 67  Temp(Src) 98  F (36.7 C) (Oral)  Wt 174 lb (78.926 kg)  SpO2 98% Wt Readings from Last 3 Encounters:  08/01/14 174 lb (78.926 kg)  05/18/14 173 lb 12 oz (78.812 kg)  05/14/14 175 lb (79.379 kg)     General: Appears herstated age, ill appearing in NAD. HEENT: Head: normal shape and size, no sinus tenderness noted; Eyes: sclera white, no icterus, conjunctiva pink; Ears: Tm's gray and intact, normal light reflex; Nose: mucosa pink and moist, septum midline; Throat/Mouth: Teeth present, mucosa pink and moist, no exudate noted, no lesions or ulcerations noted.  Neck: No lymphadenopathy.  Cardiovascular: Normal rate and rhythm. S1,S2 noted.  No murmur, rubs or gallops noted.  Pulmonary/Chest: Normal effort and coarse vesicular breath sounds. Bilateral expiratory wheeze noted. No respiratory distress. No rales or ronchi noted.      Assessment & Plan:   COPD exacerbation:  Smoking  Cessation discussed- she reports that she has practically quit, but she is still smoking daily eRx for pred taper for wheezing, also continue previously prescribed inhalers, no need for neb treatment in office today eRx for Doxycycline BID x 10 days If worse, consider chest xray  RTC as needed or if symptoms persist or worsen Upper Respiratory Infection:  Get some rest and drink plenty of water Do salt water gargles for the sore throat eRx for Azithromax x 5 days eRx for Hycodan cough syrup  RTC as needed or if symptoms persist.

## 2014-08-24 ENCOUNTER — Ambulatory Visit: Payer: Medicare Other | Admitting: Internal Medicine

## 2014-08-30 ENCOUNTER — Encounter: Payer: Self-pay | Admitting: Nurse Practitioner

## 2014-08-30 ENCOUNTER — Ambulatory Visit (INDEPENDENT_AMBULATORY_CARE_PROVIDER_SITE_OTHER): Payer: Medicare Other | Admitting: Nurse Practitioner

## 2014-08-30 VITALS — BP 134/78 | HR 58 | Temp 98.4°F | Resp 16 | Ht 64.0 in | Wt 174.4 lb

## 2014-08-30 DIAGNOSIS — G44059 Short lasting unilateral neuralgiform headache with conjunctival injection and tearing (SUNCT), not intractable: Secondary | ICD-10-CM

## 2014-08-30 MED ORDER — ALPRAZOLAM 0.25 MG PO TABS: 0.2500 mg | ORAL_TABLET | ORAL | Status: DC | PRN

## 2014-08-30 MED ORDER — MELOXICAM 7.5 MG PO TABS: 7.5000 mg | ORAL_TABLET | Freq: Every day | ORAL | Status: DC

## 2014-08-30 MED ORDER — HYDROCHLOROTHIAZIDE 12.5 MG PO TABS: 12.5000 mg | ORAL_TABLET | Freq: Every day | ORAL | Status: DC

## 2014-08-30 NOTE — Patient Instructions (Signed)
Maria Hinton (Formerly [facility]) [address] [place], [place] [address] Phone: [phone]

## 2014-08-30 NOTE — Progress Notes (Signed)
Pre visit review using our clinic review tool, if applicable. No additional management support is needed unless otherwise documented below in the visit note. 

## 2014-08-30 NOTE — Progress Notes (Signed)
Subjective:    Patient ID: Maria Hinton, female    DOB: 1945-10-13, 69 y.o.   MRN: [id number]  HPI  Ms. Lirette is a 69 yo female with a CC of headache.  1) Last 2-3 months comes and goes, 2-3 days been worse, sharp pains into temple down to left eye, left eye fuzzy, last eye exam- March-April last year Swimmy headed, nausea  Had CTA and MRA of head previously in November. Results may show an aneurysm, but it is only probable not definite.  Dr. Sherwood Gambler was neurosurgeon previously and already established.   Review of Systems  Constitutional: Negative for fever, chills, diaphoresis and fatigue.  Respiratory: Negative for chest tightness, shortness of breath and wheezing.   Cardiovascular: Negative for chest pain, palpitations and leg swelling.  Gastrointestinal: Negative for nausea, vomiting, diarrhea and rectal pain.  Skin: Negative for rash.  Neurological: Positive for headaches. Negative for dizziness, weakness and numbness.  Psychiatric/Behavioral: The patient is not nervous/anxious.    Past Medical History  Diagnosis Date  . PONV (postoperative nausea and vomiting)   . Hypertension     takes Metoprolol/HCTZ daily  . Hyperlipidemia     takes Pravastatin daily  . Asthma   . Pneumonia     x 2 ;in Dec 2010/2011  . Arthritis   . Joint swelling   . Joint pain     fingers  . Chronic back pain     DDD,herniated disc/spondylosis/radiculopathy  . GERD (gastroesophageal reflux disease)     takes Omeprazole daily  . Hemorrhoids   . History of colonic polyps     colonoscopy 04/2012 - Dr Candace Cruise  . Diabetes mellitus   . Early cataracts, bilateral     Dr. Ruthine Dose - [facility]  . Depression     after death of husband;takes Ativan prn    History   Social History  . Marital Status: Widowed    Spouse Name: N/A  . Number of Children: N/A  . Years of Education: N/A   Occupational History  . Not on file.   Social History Main Topics  . Smoking status: Current  Every Day Smoker -- 1.50 packs/day for 20 years    Types: Cigarettes  . Smokeless tobacco: Never Used     Comment: Pt had quit smoking for 13 years before starting back in 2010  . Alcohol Use: No  . Drug Use: No  . Sexual Activity: No   Other Topics Concern  . Not on file   Social History Narrative    Past Surgical History  Procedure Laterality Date  . Thoracic disc surgery  at age 45 and 51     mass of veins that had gotten into muscle and wrapped around rib;3 ribs also removed from left side  . Abdominal hysterectomy      at age 22  . Back surgery  20+yrs ago  . Foot surgery  2011    bunionectomy and knot removed from bottom of both feet  . Melanoma excision  2011    on nose/back on neck  . Vagina surgery      vaginal wall ruptured   . Tonsillectomy  as a child    and adenoids   . Colonoscopy    . Esophagogastroduodenoscopy  2011  . Tubal ligation    . Lumbar laminectomy/decompression microdiscectomy  09/10/2011    Procedure: LUMBAR LAMINECTOMY/DECOMPRESSION MICRODISCECTOMY 1 LEVEL;  Surgeon: Hosie Spangle, MD;  Location: [facility];  Service: Neurosurgery;  Laterality: Right;  RIGHT Lumbar  Laminotomy and microdiskectomy Lumbar Four-Five  . Total hip arthroplasty Left 2014    Dr. Mack Guise    Family History  Problem Relation Age of Onset  . Anesthesia problems Neg Hx   . Hypotension Neg Hx   . Malignant hyperthermia Neg Hx   . Pseudochol deficiency Neg Hx   . Heart disease Brother     valve replacement  . COPD Brother   . COPD Mother     Emphysema  . Diabetes Mother   . Heart disease Father   . Seizures Father   . Cancer Father     Lung cancer with brain metastasis  . Asthma Brother   . COPD Son 61    Allergies  Allergen Reactions  . Vicodin [Hydrocodone-Acetaminophen] Other (See Comments)    Insomnia     Current Outpatient Prescriptions on File Prior to Visit  Medication Sig Dispense Refill  . albuterol (PROVENTIL HFA;VENTOLIN HFA) 108 (90  BASE) MCG/ACT inhaler Inhale 2 puffs into the lungs every 6 (six) hours as needed for wheezing. 6.7 g 11  . ALPRAZolam (XANAX) 0.25 MG tablet Take 1 tablet (0.25 mg total) by mouth as needed for sleep. 30 tablet 2  . atenolol (TENORMIN) 50 MG tablet Take 1 tablet (50 mg total) by mouth 2 (two) times daily. 180 tablet 1  . atorvastatin (LIPITOR) 40 MG tablet Take 1 tablet (40 mg total) by mouth daily at 6 PM. 90 tablet 1  . Blood Glucose Monitoring Suppl (ONE TOUCH ULTRA SYSTEM KIT) W/DEVICE KIT Pt needs OneTouch Ultra Blue glucometer dx 250.02 1 each 0  . doxycycline (VIBRA-TABS) 100 MG tablet Take 1 tablet (100 mg total) by mouth 2 (two) times daily. 20 tablet 0  . glipiZIDE (GLUCOTROL) 5 MG tablet Take 1 tablet (5 mg total) by mouth 2 (two) times daily before a meal. 180 tablet 1  . glucose blood (ONE TOUCH ULTRA TEST) test strip Use as instructed bid, has OneTouch Ultra Blue  dx: 250.02 100 each 3  . hydrochlorothiazide (HYDRODIURIL) 12.5 MG tablet Take 1 tablet (12.5 mg total) by mouth daily. 30 tablet 6  . Insulin Detemir (LEVEMIR) 100 UNIT/ML Pen Inject 10-13 Units into the skin 2 (two) times daily. 10 units in the morning, 13 units at night    . Lancets (ONETOUCH ULTRASOFT) lancets Use as instructed bid dx 250.02 100 each 3  . meloxicam (MOBIC) 7.5 MG tablet TAKE ONE TABLET BY MOUTH ONCE DAILY 30 tablet 6  . metFORMIN (GLUCOPHAGE) 1000 MG tablet Take 1 tablet (1,000 mg total) by mouth 2 (two) times daily with a meal. 180 tablet 3  . omeprazole (PRILOSEC) 20 MG capsule Take 1 capsule (20 mg total) by mouth daily. 90 capsule 1  . predniSONE (DELTASONE) 10 MG tablet Take 3 tabs on days 1-2, take 2 tabs on days 3-4, take 1 tab on days 5-6 12 tablet 0  . tiotropium (SPIRIVA) 18 MCG inhalation capsule Place 1 capsule (18 mcg total) into inhaler and inhale daily. 30 capsule 12   No current facility-administered medications on file prior to visit.      Objective:   Physical Exam  Constitutional:  She is oriented to person, place, and time. She appears well-developed and well-nourished. No distress.  BP 134/78 mmHg  Pulse 58  Temp(Src) 98.4 F (36.9 C) (Oral)  Resp 16  Ht _0  (1.626 m)  Wt 174 lb 6.4 oz (79.107 kg)  BMI 29.92 kg/m2  SpO2  97%   HENT:  Head: Normocephalic and atraumatic.  Right Ear: External ear normal.  Left Ear: External ear normal.  Mouth/Throat: No oropharyngeal exudate.  Cardiovascular: Normal rate, regular rhythm, normal heart sounds and intact distal pulses.  Exam reveals no gallop and no friction rub.   No murmur heard. Pulmonary/Chest: Effort normal and breath sounds normal. No respiratory distress. She has no wheezes. She has no rales. She exhibits no tenderness.  Neurological: She is alert and oriented to person, place, and time. No cranial nerve deficit. She exhibits normal muscle tone. Coordination normal.  Negative Rhomberg, strength upper and lower extremities 5/5, sensation intact.  Skin: Skin is warm and dry. No rash noted. She is not diaphoretic.  Psychiatric: She has a normal mood and affect. Her behavior is normal. Judgment and thought content normal.      Assessment & Plan:

## 2014-08-31 ENCOUNTER — Other Ambulatory Visit: Payer: Self-pay | Admitting: Nurse Practitioner

## 2014-08-31 ENCOUNTER — Telehealth: Payer: Self-pay | Admitting: Internal Medicine

## 2014-08-31 MED ORDER — ONDANSETRON HCL 4 MG PO TABS: 4.0000 mg | ORAL_TABLET | Freq: Three times a day (TID) | ORAL | Status: DC | PRN

## 2014-08-31 NOTE — Telephone Encounter (Signed)
Patient notified and voiced understanding.

## 2014-08-31 NOTE — Telephone Encounter (Signed)
Sorry, my fault. I sent in Zofran to pharmacy listed. Thanks!

## 2014-08-31 NOTE — Telephone Encounter (Signed)
Patient stated she saw you on 08/30/14 and you were suppose to give her something for nausea please advise nothing in chart.

## 2014-09-03 NOTE — Assessment & Plan Note (Signed)
Recurrent and not improving. Mass was ruled out with imaging (MRA and CTA) Possible aneurysm on MRA. Will refer back to Neurosurgeon for neuralgia consult. Since pt is established we reviewed results, exam was normal, and asked her to make the appointment.

## 2014-09-17 ENCOUNTER — Encounter: Payer: Self-pay | Admitting: Internal Medicine

## 2014-09-17 ENCOUNTER — Ambulatory Visit (INDEPENDENT_AMBULATORY_CARE_PROVIDER_SITE_OTHER): Payer: Medicare Other | Admitting: Internal Medicine

## 2014-09-17 VITALS — BP 140/88 | HR 67 | Temp 98.6°F | Resp 16 | Ht 64.0 in | Wt 175.8 lb

## 2014-09-17 DIAGNOSIS — R51 Headache: Secondary | ICD-10-CM

## 2014-09-17 DIAGNOSIS — G4486 Cervicogenic headache: Secondary | ICD-10-CM

## 2014-09-17 DIAGNOSIS — J449 Chronic obstructive pulmonary disease, unspecified: Secondary | ICD-10-CM

## 2014-09-17 DIAGNOSIS — E785 Hyperlipidemia, unspecified: Secondary | ICD-10-CM

## 2014-09-17 DIAGNOSIS — E1165 Type 2 diabetes mellitus with hyperglycemia: Secondary | ICD-10-CM

## 2014-09-17 DIAGNOSIS — Z716 Tobacco abuse counseling: Secondary | ICD-10-CM

## 2014-09-17 DIAGNOSIS — Z720 Tobacco use: Secondary | ICD-10-CM

## 2014-09-17 DIAGNOSIS — IMO0002 Reserved for concepts with insufficient information to code with codable children: Secondary | ICD-10-CM

## 2014-09-17 DIAGNOSIS — I10 Essential (primary) hypertension: Secondary | ICD-10-CM

## 2014-09-17 DIAGNOSIS — M5010 Cervical disc disorder with radiculopathy, unspecified cervical region: Secondary | ICD-10-CM

## 2014-09-17 DIAGNOSIS — G44059 Short lasting unilateral neuralgiform headache with conjunctival injection and tearing (SUNCT), not intractable: Secondary | ICD-10-CM

## 2014-09-17 DIAGNOSIS — Z23 Encounter for immunization: Secondary | ICD-10-CM

## 2014-09-17 LAB — LIPID PANEL
Cholesterol: 163 mg/dL (ref 0–200)
HDL: 42.1 mg/dL (ref >39.00)
NonHDL: 120.9
Total CHOL/HDL Ratio: 4
Triglycerides: 297 mg/dL — ABNORMAL HIGH (ref 0.0–149.0)
VLDL: 59.4 mg/dL — ABNORMAL HIGH (ref 0.0–40.0)

## 2014-09-17 LAB — COMPREHENSIVE METABOLIC PANEL
ALT: 35 U/L (ref 0–35)
AST: 19 U/L (ref 0–37)
Albumin: 4.2 g/dL (ref 3.5–5.2)
Alkaline Phosphatase: 109 U/L (ref 39–117)
BUN: 18 mg/dL (ref 6–23)
CO2: 30 mEq/L (ref 19–32)
Calcium: 9.2 mg/dL (ref 8.4–10.5)
Chloride: 104 mEq/L (ref 96–112)
Creatinine, Ser: 1.04 mg/dL (ref 0.40–1.20)
GFR: 56.25 mL/min — ABNORMAL LOW (ref >60.00)
Glucose, Bld: 190 mg/dL — ABNORMAL HIGH (ref 70–99)
Potassium: 4.6 mEq/L (ref 3.5–5.1)
Sodium: 140 mEq/L (ref 135–145)
Total Bilirubin: 0.5 mg/dL (ref 0.2–1.2)
Total Protein: 6.5 g/dL (ref 6.0–8.3)

## 2014-09-17 LAB — HEMOGLOBIN A1C: Hgb A1c MFr Bld: 7.7 % — ABNORMAL HIGH (ref 4.6–6.5)

## 2014-09-17 LAB — LDL CHOLESTEROL, DIRECT: Direct LDL: 78 mg/dL

## 2014-09-17 LAB — HM DIABETES FOOT EXAM: HM Diabetic Foot Exam: NORMAL

## 2014-09-17 LAB — SEDIMENTATION RATE: Sed Rate: 8 mm/hr (ref 0–22)

## 2014-09-17 MED ORDER — HYDROCHLOROTHIAZIDE 25 MG PO TABS: 25.0000 mg | ORAL_TABLET | Freq: Every day | ORAL | Status: DC

## 2014-09-17 NOTE — Patient Instructions (Addendum)
Your blood pressure is too high so I am increasing the hdtz to 25 mg daily  Please start taking a baby aspirin daily   MRI cervical spine to be be ordered

## 2014-09-17 NOTE — Progress Notes (Signed)
Patient ID: Maria Hinton, female   DOB: [birth year], 69 y.o.   MRN: [id number]  Patient Active Problem List   Diagnosis Date Noted  . Headache, cervicogenic 05/20/2014  . COPD type B 04/04/2014  . SOB (shortness of breath) 02/21/2014  . Extrinsic asthma, unspecified 02/21/2014  . DOE (dyspnea on exertion) 02/21/2014  . Tobacco abuse disorder 02/21/2014  . Need for Tdap vaccination 02/09/2014  . Need for prophylactic vaccination against Streptococcus pneumoniae (pneumococcus) 02/09/2014  . Tobacco abuse counseling 02/09/2014  . Screening for osteoporosis 02/09/2014  . Pulmonary disease 02/09/2014  . Fatigue 11/28/2013  . Left shoulder pain 07/28/2013  . DM (diabetes mellitus), type 2, uncontrolled 04/27/2013  . Encounter for smoking cessation counseling 04/27/2013  . Arthritis 04/27/2013  . HTN (hypertension) 04/11/2013  . HLD (hyperlipidemia) 04/11/2013    Subjective:  CC:   Chief Complaint  Patient presents with  . Follow-up  . Diabetes    HPI:   Maria Hinton is a 69 y.o. female who presents for  Follow up on multiple issues including type 2 DM, tobacco abuse,  recurrent headaches,  She was last seen November at which time her a1c was 7. 0.  Has not had annual influenza vaccine.  Has resumed smoking half pack daily again, multiple family members in house smoke, cites that as well as the stress of family issues,  Lives with 5 other adults AND  3 dogs, FOR  the past 2.5 yrs since her hospitalization for 6 days at [facility] for unclear diagnosis as the cause  Of hypotension, dehydration .  Spent 6 days,     Her blood  sugars in am are averaging 198 in the morning for the past several weeks and have not resolved despite finishing the prednisone TAPER and abx prescribed by NP at [facility] for another COPD exacerbation .  Using glipizde 5 mg bid  And levemir bid  10 units /13 units (bid dosing) . Reports compliance with medications,  And she is trying follow a low glycemic index  diet.  Avoiding breads,  Soft drinks,     Lab Results  Component Value Date   HGBA1C 7.0* 05/18/2014    Still having frequent episodes of sharp stabbing pain left sided of scalp sometimes goes to temple and eye,  Skips days,  Then will have 3 or 4 /hour,  Has chronic neck pain.  CRA/CT angiogramwas done last fall and was negative for  aneurysm.  No prior imaging with  MRI cervical spine    Past Medical History  Diagnosis Date  . PONV (postoperative nausea and vomiting)   . Hypertension     takes Metoprolol/HCTZ daily  . Hyperlipidemia     takes Pravastatin daily  . Asthma   . Pneumonia     x 2 ;in Dec 2010/2011  . Arthritis   . Joint swelling   . Joint pain     fingers  . Chronic back pain     DDD,herniated disc/spondylosis/radiculopathy  . GERD (gastroesophageal reflux disease)     takes Omeprazole daily  . Hemorrhoids   . History of colonic polyps     colonoscopy 04/2012 - Dr Candace Cruise  . Diabetes mellitus   . Early cataracts, bilateral     Dr. Ruthine Dose - [facility]  . Depression     after death of husband;takes Ativan prn    Past Surgical History  Procedure Laterality Date  . Thoracic disc surgery  at age 9 and 47  mass of veins that had gotten into muscle and wrapped around rib;3 ribs also removed from left side  . Abdominal hysterectomy      at age 24  . Back surgery  20+yrs ago  . Foot surgery  2011    bunionectomy and knot removed from bottom of both feet  . Melanoma excision  2011    on nose/back on neck  . Vagina surgery      vaginal wall ruptured   . Tonsillectomy  as a child    and adenoids   . Colonoscopy    . Esophagogastroduodenoscopy  2011  . Tubal ligation    . Lumbar laminectomy/decompression microdiscectomy  09/10/2011    Procedure: LUMBAR LAMINECTOMY/DECOMPRESSION MICRODISCECTOMY 1 LEVEL;  Surgeon: Hosie Spangle, MD;  Location: [facility];  Service: Neurosurgery;  Laterality: Right;  RIGHT Lumbar  Laminotomy and microdiskectomy  Lumbar Four-Five  . Total hip arthroplasty Left 2014    Dr. Mack Guise  . Joint replacement Left 2014    hip  Cindi Carbon       The following portions of the patient's history were reviewed and updated as appropriate: Allergies, current medications, and problem list.    Review of Systems:   Patient denies headache, fevers, malaise, unintentional weight loss, skin rash, eye pain, sinus congestion and sinus pain, sore throat, dysphagia,  hemoptysis , cough, dyspnea, wheezing, chest pain, palpitations, orthopnea, edema, abdominal pain, nausea, melena, diarrhea, constipation, flank pain, dysuria, hematuria, urinary  Frequency, nocturia, numbness, tingling, seizures,  Focal weakness, Loss of consciousness,  Tremor, insomnia, depression, anxiety, and suicidal ideation.     History   Social History  . Marital Status: Widowed    Spouse Name: N/A  . Number of Children: N/A  . Years of Education: N/A   Occupational History  . Not on file.   Social History Main Topics  . Smoking status: Current Every Day Smoker -- 1.50 packs/day for 20 years    Types: Cigarettes  . Smokeless tobacco: Never Used     Comment: Pt had quit smoking for 13 years before starting back in 2010  . Alcohol Use: No  . Drug Use: No  . Sexual Activity: No   Other Topics Concern  . Not on file   Social History Narrative    Objective:  Filed Vitals:   09/17/14 0900  BP: 140/88  Pulse: 67  Temp: 98.6 F (37 C)  Resp: 16     General appearance: alert, cooperative and appears stated age Ears: normal TM's and external ear canals both ears Throat: lips, mucosa, and tongue normal; teeth and gums normal Neck: no adenopathy, no carotid bruit, supple, symmetrical, trachea midline and thyroid not enlarged, symmetric, no tenderness/mass/nodules Back: symmetric, no curvature. ROM normal. No CVA tenderness. Lungs: clear to auscultation bilaterally Heart: regular rate and rhythm, S1, S2 normal, no murmur, click,  rub or gallop Abdomen: soft, non-tender; bowel sounds normal; no masses,  no organomegaly Pulses: 2+ and symmetric Skin: Skin color, texture, turgor normal. No rashes or lesions Lymph nodes: Cervical, supraclavicular, and axillary nodes normal.  Assessment and Plan:  HTN (hypertension) Elevated today.  Reviewed list of meds, patient is not taking OTC meds that could be causing it but is smoking again, . Marland Kitchen  Increased hctz to 25 mg today .    DM (diabetes mellitus), type 2, uncontrolled Not well-controlled on current medications by current hemoglobin A1c.  but sugars are elevated post prandially and fasting , Patient is up-to-date on eye exams  and foot exam is normal today. ..  Foot exam done. Diet reviewed.  Meds reviewed.  Will increase glipizide to 10 mg bid add a daily baby aspirin.  pirin daily., continue statin and continue adding an ACE inhibitor at next visit if she hasproteinuria   Lab Results  Component Value Date   HGBA1C 7.7* 09/17/2014   Lab Results  Component Value Date   MICROALBUR 1.1 05/18/2014   Lab Results  Component Value Date   CHOL 163 09/17/2014   HDL 42.10 09/17/2014   LDLCALC 75 05/18/2014   LDLDIRECT 78.0 09/17/2014   TRIG 297.0* 09/17/2014   CHOLHDL 4 09/17/2014         HLD (hyperlipidemia) Well controlled on current statin therapy except for triglcyeries which should improve with diet and exercise. .   Liver enzymes are normal , no changes today.  Lab Results  Component Value Date   CHOL 163 09/17/2014   HDL 42.10 09/17/2014   LDLCALC 75 05/18/2014   LDLDIRECT 78.0 09/17/2014   TRIG 297.0* 09/17/2014   CHOLHDL 4 09/17/2014    Lab Results  Component Value Date   ALT 35 09/17/2014   AST 19 09/17/2014   ALKPHOS 109 09/17/2014   BILITOT 0.5 09/17/2014        Headache, cervicogenic I suspect the pain is referred from cervical disk disease since CTA and MRA ruled out aneursym,  will order MRI cervical spine    Encounter for  smoking cessation counseling She has had 2 COPD exacebations in 4 months,  The patient was counseled on the dangers of tobacco use, and was advised to quit.  Reviewed strategies to maximize success, including removing cigarettes and smoking materials from environment.   A total of 40 minutes was spent with patient more than half of which was spent in counseling patient on the above mentioned issues , reviewing and explaining recent  imaging studies done, and coordination of care. Updated Medication List Outpatient Encounter Prescriptions as of 09/17/2014  Medication Sig  . albuterol (PROVENTIL HFA;VENTOLIN HFA) 108 (90 BASE) MCG/ACT inhaler Inhale 2 puffs into the lungs every 6 (six) hours as needed for wheezing.  Marland Kitchen ALPRAZolam (XANAX) 0.25 MG tablet Take 1 tablet (0.25 mg total) by mouth as needed for sleep.  Marland Kitchen atenolol (TENORMIN) 50 MG tablet Take 1 tablet (50 mg total) by mouth 2 (two) times daily.  Marland Kitchen atorvastatin (LIPITOR) 40 MG tablet Take 1 tablet (40 mg total) by mouth daily at 6 PM.  . Blood Glucose Monitoring Suppl (ONE TOUCH ULTRA SYSTEM KIT) W/DEVICE KIT Pt needs OneTouch Ultra Blue glucometer dx 250.02  . doxycycline (VIBRA-TABS) 100 MG tablet Take 1 tablet (100 mg total) by mouth 2 (two) times daily.  Marland Kitchen glipiZIDE (GLUCOTROL) 10 MG tablet Take 1 tablet (10 mg total) by mouth 2 (two) times daily before a meal.  . glucose blood (ONE TOUCH ULTRA TEST) test strip Use as instructed bid, has OneTouch Ultra Blue  dx: 250.02  . hydrochlorothiazide (HYDRODIURIL) 25 MG tablet Take 1 tablet (25 mg total) by mouth daily.  . Insulin Detemir (LEVEMIR) 100 UNIT/ML Pen Inject 10-13 Units into the skin 2 (two) times daily. 10 units in the morning, 13 units at night  . Lancets (ONETOUCH ULTRASOFT) lancets Use as instructed bid dx 250.02  . meloxicam (MOBIC) 7.5 MG tablet Take 1 tablet (7.5 mg total) by mouth daily.  . metFORMIN (GLUCOPHAGE) 1000 MG tablet Take 1 tablet (1,000 mg total) by mouth 2 (two)  times daily  with a meal.  . omeprazole (PRILOSEC) 20 MG capsule Take 1 capsule (20 mg total) by mouth daily.  . ondansetron (ZOFRAN) 4 MG tablet Take 1 tablet (4 mg total) by mouth every 8 (eight) hours as needed for nausea or vomiting.  . predniSONE (DELTASONE) 10 MG tablet Take 3 tabs on days 1-2, take 2 tabs on days 3-4, take 1 tab on days 5-6  . tiotropium (SPIRIVA) 18 MCG inhalation capsule Place 1 capsule (18 mcg total) into inhaler and inhale daily.  . [DISCONTINUED] glipiZIDE (GLUCOTROL) 5 MG tablet Take 1 tablet (5 mg total) by mouth 2 (two) times daily before a meal.  . [DISCONTINUED] hydrochlorothiazide (HYDRODIURIL) 12.5 MG tablet Take 1 tablet (12.5 mg total) by mouth daily.     Orders Placed This Encounter  Procedures  . MR Cervical Spine Wo Contrast  . HM MAMMOGRAPHY  . Flu Vaccine QUAD 36+ mos IM  . Lipid panel  . Hemoglobin A1c  . Comprehensive metabolic panel  . Sedimentation rate  . LDL cholesterol, direct  . HM DIABETES FOOT EXAM  . HM COLONOSCOPY    Return in about 3 months (around 12/18/2014) for follow up diabetes.

## 2014-09-17 NOTE — Progress Notes (Signed)
Pre-visit discussion using our clinic review tool. No additional management support is needed unless otherwise documented below in the visit note.  

## 2014-09-18 ENCOUNTER — Encounter: Payer: Self-pay | Admitting: Internal Medicine

## 2014-09-18 MED ORDER — GLIPIZIDE 10 MG PO TABS: 10.0000 mg | ORAL_TABLET | Freq: Two times a day (BID) | ORAL | Status: DC

## 2014-09-18 NOTE — Assessment & Plan Note (Addendum)
Elevated today.  Reviewed list of meds, patient is not taking OTC meds that could be causing it but is smoking again, . Marland Kitchen  Increased hctz to 25 mg today .

## 2014-09-18 NOTE — Assessment & Plan Note (Signed)
Not well-controlled on current medications by current hemoglobin A1c.  but sugars are elevated post prandially and fasting , Patient is up-to-date on eye exams and foot exam is normal today. ..  Foot exam done. Diet reviewed.  Meds reviewed.  Will increase glipizide to 10 mg bid add a daily baby aspirin.  pirin daily., continue statin and continue adding an ACE inhibitor at next visit if she hasproteinuria   Lab Results  Component Value Date   HGBA1C 7.7* 09/17/2014   Lab Results  Component Value Date   MICROALBUR 1.1 05/18/2014   Lab Results  Component Value Date   CHOL 163 09/17/2014   HDL 42.10 09/17/2014   LDLCALC 75 05/18/2014   LDLDIRECT 78.0 09/17/2014   TRIG 297.0* 09/17/2014   CHOLHDL 4 09/17/2014

## 2014-09-18 NOTE — Assessment & Plan Note (Signed)
I suspect the pain is referred from cervical disk disease since CTA and MRA ruled out aneursym,  will order MRI cervical spine

## 2014-09-18 NOTE — Assessment & Plan Note (Signed)
She has had 2 COPD exacebations in 4 months,  The patient was counseled on the dangers of tobacco use, and was advised to quit.  Reviewed strategies to maximize success, including removing cigarettes and smoking materials from environment.

## 2014-09-18 NOTE — Assessment & Plan Note (Signed)
Well controlled on current statin therapy except for triglcyeries which should improve with diet and exercise. .   Liver enzymes are normal , no changes today.  Lab Results  Component Value Date   CHOL 163 09/17/2014   HDL 42.10 09/17/2014   LDLCALC 75 05/18/2014   LDLDIRECT 78.0 09/17/2014   TRIG 297.0* 09/17/2014   CHOLHDL 4 09/17/2014    Lab Results  Component Value Date   ALT 35 09/17/2014   AST 19 09/17/2014   ALKPHOS 109 09/17/2014   BILITOT 0.5 09/17/2014

## 2014-09-21 ENCOUNTER — Other Ambulatory Visit: Payer: Self-pay

## 2014-09-27 ENCOUNTER — Ambulatory Visit: Payer: Self-pay | Admitting: Internal Medicine

## 2014-09-27 LAB — HM MAMMOGRAPHY
HM Mammogram: NEGATIVE
HM Mammogram: NEGATIVE

## 2014-10-01 ENCOUNTER — Encounter: Payer: Self-pay | Admitting: Internal Medicine

## 2014-10-01 ENCOUNTER — Ambulatory Visit: Payer: Self-pay | Admitting: Internal Medicine

## 2014-10-04 ENCOUNTER — Other Ambulatory Visit: Payer: Self-pay | Admitting: Nurse Practitioner

## 2014-10-04 NOTE — Telephone Encounter (Signed)
Pt last seen by you on 2/25 and [place] 3/14.  Please advise.

## 2014-10-08 ENCOUNTER — Other Ambulatory Visit: Payer: Self-pay | Admitting: Nurse Practitioner

## 2014-10-08 NOTE — Telephone Encounter (Signed)
Last OV 3.14.16, last refill 2.25.16. Please advise refill

## 2014-10-09 ENCOUNTER — Telehealth: Payer: Self-pay | Admitting: Internal Medicine

## 2014-10-09 MED ORDER — OMEPRAZOLE 20 MG PO CPDR: 20.0000 mg | DELAYED_RELEASE_CAPSULE | Freq: Every day | ORAL | Status: DC

## 2014-10-09 MED ORDER — ATORVASTATIN CALCIUM 40 MG PO TABS: 40.0000 mg | ORAL_TABLET | Freq: Every day | ORAL | Status: DC

## 2014-10-09 MED ORDER — ATENOLOL 50 MG PO TABS: 50.0000 mg | ORAL_TABLET | Freq: Two times a day (BID) | ORAL | Status: DC

## 2014-10-09 MED ORDER — TIOTROPIUM BROMIDE MONOHYDRATE 18 MCG IN CAPS: 18.0000 ug | ORAL_CAPSULE | Freq: Every day | RESPIRATORY_TRACT | Status: DC

## 2014-10-09 MED ORDER — ALPRAZOLAM 0.25 MG PO TABS: ORAL_TABLET | ORAL | Status: DC

## 2014-10-09 MED ORDER — METFORMIN HCL 1000 MG PO TABS: 1000.0000 mg | ORAL_TABLET | Freq: Two times a day (BID) | ORAL | Status: DC

## 2014-10-09 NOTE — Telephone Encounter (Signed)
Patient called and changing pharmacy and medication s needed refill sent refill as requested to [facility] in [place]

## 2014-10-09 NOTE — Telephone Encounter (Signed)
Ok to refill,  printed rx with refills.  Destroy other one

## 2014-10-09 NOTE — Telephone Encounter (Signed)
rx faxed

## 2014-10-23 NOTE — Consult Note (Signed)
Impression: 69yo WF w/ h/o DM admitted with headache, arthralgias and leukocytosis.  Despite having headache, she has not had any fevers.  She was on doxycycline for 8 days (although it was only daily).  I would have expected that if she had RMSF or Ehrlichia, she would have improved. At 3 weeks, it would be a fairly long course for these tick bourne illnesses.  This is not consistent with Lyme disease.  She certainly appears to have an inflammatory process.  Her WBC is better today on doxycycline. Her cultures are negative to date.  Will stop the doxycycline. Will check ANA, ESR, rheumatoid factor.  She has two relatives with RA. Would follow CBC.  Electronic Signatures: , Heinz Knuckles (MD)  (Signed on 18-Oct-13 15:15)  Authored  Last Updated: 18-Oct-13 15:15 by , Heinz Knuckles (MD)

## 2014-10-23 NOTE — Consult Note (Signed)
Full consult to follow. Being evaluated by ID for poss infectious/inflammatory process. Pt also anemic. Had both EGD and colon by me in 2010 for anemia. Colon neg. EGD showed duodenal polyp. Bx'd. EGD repeated in 2011 and most of polyp removed. Bx showed heterotopic gastric mucosa with inflamm. Recommended repeating EGD in 2013. Even though pt's current sxs are unrelated with EGD findings, recommend repeating EGD while she is here. Will plan tomorrow afternoon. Thanks.  Electronic Signatures: Verdie Shire (MD)  (Signed on 20-Oct-13 10:18)  Authored  Last Updated: 20-Oct-13 10:18 by Verdie Shire (MD)

## 2014-10-23 NOTE — Consult Note (Signed)
PATIENT NAME:  Maria Hinton, Maria Hinton MR#:  [id number] DATE OF BIRTH:  [birth year]  DATE OF CONSULTATION:  04/22/2012  REFERRING PHYSICIAN:  Dr. Bridgett Larsson  CONSULTING PHYSICIAN:  Heinz Knuckles. , MD  REASON FOR CONSULTATION: Possible [place] spotted fever.   HISTORY OF PRESENT ILLNESS: Patient is a 69 year old white female with a past history significant for diabetes who was admitted yesterday with headache, generalized achiness and weakness for approximately three weeks. Patient had been in her usual state of health until approximately three weeks ago. She had recently moved to the [place] several months ago. She quit smoking approximately a week before she became ill and at the time of the onset of her illness she began having headaches that were mainly temporal headaches radiating into her jaw bilaterally. They then proceeded to go down the back of her neck and into her arms and eventually involve her legs. She describes the pain as being bone pain and it focused in the joints but at times also described it as being throughout her whole body. She had no fevers or chills but did have sweats, especially at night. She has lost some weight recently due to not eating as much and having nausea and vomiting. She is a diabetic and her sugars have been a little more poorly controlled recently. She denies any sinus congestion or sore throat. She has not had any cough or shortness of breath. She has had no change in his her bowels or her urination. She came back to the [place] area and was seen by her primary care provider. She was told that she had a urinary tract infection and possible [facility] spotted fever and was started on doxycycline. She was taking the doxycycline daily rather than b.i.d. She completed eight days prior to make being admitted to the hospital. She was admitted to the hospital because her pain was still persistent. On admission her white count was elevated to 21.4. She was  started on IV doxycycline. She states that she is feeling mildly better today and her white count has come down a bit.   ALLERGIES: Vicodin.   PAST MEDICAL HISTORY:  1. Diabetes.  2. Hypertension.  3. Hypercholesterolemia.   FAMILY HISTORY: Positive for rheumatoid arthritis in her mother, rheumatoid arthritis and  osteoarthritis in a more distant relative, coronary artery disease and diabetes in her mother.   SOCIAL HISTORY: Patient lives by herself. She recently moved to the [place] several months ago. She does not drink alcohol. No history of injecting drug use. She is a prior smoker but quit shortly before she became ill.   REVIEW OF SYSTEMS: GENERAL: Positive for sweats but no fevers or chills. Positive generalized malaise and weakness. HEENT: Positive headache. No sinus congestion. No nasal congestion. No sore throat. No dental pain. NECK: Positive neck pain posteriorly but no lymphadenopathy. CHEST: No cough, shortness of breath or sputum production. CARDIAC: No chest pains or palpitations. No peripheral edema. GASTROINTESTINAL: Positive nausea with dry heaves. No abdominal pain, no change in her bowels. GENITOURINARY: No change in her urine. MUSCULOSKELETAL: She has had generalized malaise with joint pain. She also describes some muscle pain. She has not had any inflamed joints with redness or swelling, however. SKIN: No rashes. NEUROLOGIC: No focal weakness. PSYCHIATRIC: No complaints.   PHYSICAL EXAMINATION:  VITAL SIGNS: T-max 98.5, T-current 98.1, pulse 79, blood pressure 104/62, respirations 20, 98% on room air.   GENERAL: 69 year old white female in no acute distress.  HEENT: Normocephalic, atraumatic. Pupils equal, reactive to light. Extraocular motion intact. Sclerae, conjunctivae, and lids are without evidence for emboli or petechiae. Oropharynx shows no erythema or exudate. Teeth and gums are in fair condition.   NECK: Supple. Full range of motion. Midline  trachea. No lymphadenopathy. No thyromegaly.   CHEST: Clear to auscultation bilaterally with good air movement. No focal consolidation.   CARDIAC: Regular rate and rhythm without murmur, rub, or gallop.   ABDOMEN: Soft, nontender, nondistended. No hepatosplenomegaly. No hernia is noted.   EXTREMITIES: No evidence for tenosynovitis. Her joints do not appear to be focally tender.   NEUROLOGIC: The patient is awake and interactive, moving all four extremities.   PSYCHIATRIC: Mood and affect appeared normal.  LABORATORY, DIAGNOSTIC AND RADIOLOGICAL DATA: BUN 17, creatinine 0.99, bicarbonate 29, anion gap 10, AST 11, ALT 12, alkaline phosphatase 122, total bilirubin 0.4, white count of 13.4 with a hemoglobin 8.4, platelet count 624 with an ANC of 95. White count on admission was 21.4 with a hemoglobin 10.7, platelet count 758, and an ANC of 17.1. Blood cultures from admission show no growth. Urinalysis was unremarkable. Urine culture from admission shows no growth. RPR was nonreactive. A chest x-ray from admission shows mild hyperinflation but no acute infiltrates.   IMPRESSION: 69 year old white female with a history of diabetes admitted with headache, arthralgias, and leukocytosis.   RECOMMENDATIONS:  1. Despite having headache she has not had any fevers. She is on doxycycline for eight days (although it was only daily). I would have expected that if she had RMSF or Ehrlichia this would have improved. Three weeks would be a fairly long course for these tickborne illnesses as well.  2. Her symptoms are not very consistent with Lyme disease.  3. She certainly appears to have an inflammatory process, however, her white count is better today on doxycycline.  4. Her cultures are negative to date. Will stop the doxycycline.  5. Will check an ANA, sedimentation rate, and rheumatoid factor. She has two relatives with rheumatoid arthritis.  6. Will follow her CBC.   This is a moderately complex  infectious disease case. Thank you very much for involving me in Ms. Hendler' care.   ____________________________ Heinz Knuckles. , MD meb:cms D: 04/22/2012 15:27:48 ET T: 04/22/2012 17:31:50 ET JOB#: [id number]  cc: Heinz Knuckles. , MD, <Dictator>   E  MD ELECTRONICALLY SIGNED 04/25/2012 15:12

## 2014-10-23 NOTE — Consult Note (Signed)
PATIENT NAME:  Maria Hinton, Maria Hinton MR#:  [id number] DATE OF BIRTH:  14-Jul-1945  DATE OF CONSULTATION:  04/24/2012  REFERRING PHYSICIAN:   CONSULTING PHYSICIAN:  Lupita Dawn. Candace Cruise, MD  REASON FOR REFERRAL: Anemia.   HISTORY OF PRESENT ILLNESS: Patient is a 69 year old white female who was admitted with vague but diffuse body pain, especially involving the joints that was initially started with a headache then followed by a jaw pain and then shoulder and neck pain that moved downwards. She was on some oral antibiotics at home for possible [facility] spotted fever. Because of persistent and worsening symptoms, patient was brought in for further evaluation. Patient is currently being evaluated by ID. During the work-up patient was noted to be anemic with initial hemoglobin of 7.1, now by today, hemoglobin has already went up to 8.4. Patient actually has no GI symptoms whatsoever. She denied having any nausea, vomiting, heartburn or indigestion. There is no abdominal pain or cramping. There is no diarrhea or constipation. There is no gross hematochezia or melena.   Nevertheless, patient has had endoscopic evaluations in the past. She had an upper endoscopy and colonoscopy by me in 2010 for evaluation of anemia. At that time colonoscopy was negative. Patient previously has colonic polyps removed. Upper endoscopy showed a duodenal polyp which was biopsied. The upper endoscopy was repeated in 2011 and most of the polyp was removed. The biopsy showed heterotopic gastric mucosa with inflammation. I had recommended that she repeat the endoscopy in 2013 at that time. This is the first time she has been seen by GI since her last endoscopy.   PAST MEDICAL HISTORY:  1. Hypertension. 2. Diabetes. 3. Hyperlipidemia.   PAST SURGICAL HISTORY:  1. Hysterectomy. 2. Tonsillectomy. 3. Thoracic surgery.   SOCIAL HISTORY: She was a smoker, quit, and then resumed smoking several years ago. She has not had any tobacco use in  the last three weeks. She denies any alcohol use.   FAMILY HISTORY: Notable for heart disease and diabetes.   MEDICATIONS AT HOME:  1. Omeprazole. 2. Atorvastatin.  3. Tylenol. 4. Glyburide/metformin combination.   ALLERGIES: She is allergic to Vicodin.   REVIEW OF SYSTEMS: Things have not changed since the initial history and physical. Please refer to the review of systems done on 10/17.   PHYSICAL EXAMINATION:  GENERAL: Patient appears to be in no acute distress.   VITAL SIGNS: She was afebrile with temperature of 98 this morning, pulse 95, respirations 18, blood pressure 114/70, pulse oximetry 95.   HEENT: Normocephalic, atraumatic head. Pupils are equally reactive. Throat was clear.   NECK: Supple.   CARDIAC: Regular rhythm, rate without murmurs.   LUNGS: Clear bilaterally.   ABDOMEN: Normoactive bowel sounds, soft, nontender. There is no hepatomegaly. She had active bowel sounds.   EXTREMITIES: No clubbing, cyanosis, edema.   SKIN: Negative.   NEUROLOGICAL: Nonfocal.  LABORATORY, DIAGNOSTIC AND RADIOLOGICAL DATA: Again, her hemoglobin initially it was 7.1, now it is up to 8.4 this morning. White count 12.5. Sodium is a little low at 3.4. Magnesium level 1.0, glucose elevated at 285.   ASSESSMENT AND PLAN: This is a patient with anemia. Her hemoglobin is going up by herself without transfusion. I suspect the anemia may be related to what is going on globally. ID is following the patient. However, because of the history of duodenal polyp it may be worthwhile at least repeating the upper endoscopy and look at the upper GI tract. If the polyp has grown back since  the last time will try to remove it Tomorrow. Patient wants to get this arranged before she is discharged. She will be made n.p.o. tonight and then endoscopy tomorrow afternoon.   Thank you for the referral.   ____________________________ Lupita Dawn. Candace Cruise, MD pyo:cms D: 04/25/2012 14:18:00 ET T: 04/25/2012 14:36:10  ET JOB#: [id number]  cc: Lupita Dawn. Candace Cruise, MD, <Dictator> Lupita Dawn  MD ELECTRONICALLY SIGNED 04/29/2012 12:45

## 2014-10-23 NOTE — Consult Note (Signed)
Duodenal polyp present again. Because this polyp just beyond the pylorus, very difficult to grab this with either snare or biopsy forceps. Lot of it removed with snare. Rest removed with forceps. Some tissue remains. Rest of UGI tract normal. Pt had normal colon in 2010. Therefore, unlikely anemia due to GI blood loss. Prob related to her inflammatory/infectious process. Reg diet ordered. Will sign off. Once we get biopsy results, we will contact her. Thanks.  Electronic Signatures: Verdie Shire (MD)  (Signed on 21-Oct-13 14:49)  Authored  Last Updated: 21-Oct-13 14:49 by Verdie Shire (MD)

## 2014-10-23 NOTE — H&P (Signed)
PATIENT NAME:  Maria Hinton, Maria Hinton MR#:  [id number] DATE OF BIRTH:  [birth year]  DATE OF ADMISSION:  04/21/2012  PRIMARY CARE PHYSICIAN: Dr. Clemmie Krill  REFERRING PHYSICIAN: Dr. Renard Hamper  CHIEF COMPLAINT: Headache, generalized body pain and weakness for three weeks.   HISTORY OF PRESENT ILLNESS: 69 year old Caucasian female with history of hypertension, diabetes, hyperlipidemia presented to ED with above chief complaint. Patient is alert, awake, oriented in no acute distress. Patient said that she had a fever three weeks ago and also has had a headache, neck pain, generalized body pain and joint pain and weakness for about three weeks. In addition, patient sweated a lot and has decreased appetite but patient denies any chills, dizziness. No chest pain, palpitation, cough, sputum, or shortness of breath. No dysuria, hematuria. No abdominal pain, nausea, vomiting, or diarrhea. Patient was diagnosed with [facility] spotted fever by PCP and was started with antibiotics about one week ago but patient cannot remember the antibiotics name, however, patient's symptoms still persisted so she came to ED for further evaluation. Patient's white count is 21,000, blood pressure was low at 91/45; was treated with normal saline bolus. Patient denies any history of tick bites, ill contact or travel history. Her urinalysis is negative.   PAST MEDICAL HISTORY:  1. Hypertension.  2. Diabetes.  3. Hyperlipidemia.   PAST SURGICAL HISTORY:  1. Hysterectomy. 2. Tonsillectomy. 3. Thoracic surgery.   SOCIAL HISTORY: Patient quit smoking 20 years ago but resumed smoking three years ago and she says she quit three weeks ago. Denies any alcohol drinking or illicit drugs.   FAMILY HISTORY: Father had enlarged heart. Mother had heart disease and diabetes runs in her mother's family.   MEDICATIONS:  1. Omeprazole 20 mg p.o. daily.  2. Glyburide/metformin 5 mg/500 mg p.o. 2 tablets b.i.d.  3. Atorvastatin 20 mg p.o. once a day at  bedtime.  4. Atenolol chlorthalidone 100 mg/25 mg p.o. tablet 0.5 tablets twice a day.    ALLERGIES: Vicodin.    REVIEW OF SYSTEMS: CONSTITUTIONAL: Positive for fever but no chills, has a headache, dizziness and generalized weakness and weight loss. EYES: No double vision, blurred vision. ENT: No postnasal drip, slurred speech, or dysphagia. No epistaxis. CARDIOVASCULAR: No chest pain, palpitation, orthopnea, or nocturnal dyspnea. No leg edema. PULMONARY: No cough, sputum, shortness of breath, or hematemesis. GASTROINTESTINAL: No abdominal pain, nausea, vomiting, or diarrhea. No melena or bloody stool. GENITOURINARY: No dysuria, hematuria, or incontinence. SKIN: No rash or jaundice. MUSCULOSKELETAL: Generalized body pain, joint pain and weakness. No edema. ENDOCRINE: No heat or cold intolerance. No polyuria, polydipsia. HEMATOLOGY: No easy bruising, bleeding. NEUROLOGY: No syncope, loss of consciousness or seizure.   PHYSICAL EXAMINATION:  VITALS: Temperature 97.9, blood pressure 101/63, pulse 86, respirations 20, oxygen saturation 97% on room air.   GENERAL: Patient is alert, awake, oriented in no acute distress.   HEENT: Pupils round, equal, reactive to light and accommodation. Moist oral mucosa. Clear oropharynx.   NECK: Supple. No JVD or carotid bruit. No lymphadenopathy. No thyromegaly.   PULMONARY: Bilateral air entry. No wheezing, rales. No use of accessory muscles to breathe.   ABDOMEN: Soft. No distention or tenderness. No organomegaly. Bowel sounds present.   EXTREMITIES: No edema, clubbing, or cyanosis. No calf tenderness. Strong bilateral pedal pulses.   SKIN: No rash or jaundice.   NEUROLOGY: Alert and oriented x3. No focal deficit. Power 5/5. Sensation intact. Deep tendon reflexes mute.   LABORATORY, DIAGNOSTIC AND RADIOLOGICAL DATA: Glucose 243, BUN 28, creatinine 1.37,  sodium 134, potassium 2.8, chloride 88, bicarbonate 32. CK-MB less than 0.5, CK 20, troponin less than  0.02. WBC 21.4, hemoglobin 10.4, platelets 758. Chest x-ray showed mild hyperinflation.   EKG showed normal sinus rhythm at 78 beats per minute with prolonged QT.   IMPRESSION:  1. Systemic inflammatory response syndrome.  2. Hypotension.  3. Leukocytosis.  4. Acute renal failure.  5. Hypokalemia.  6. Thrombocytosis possibly due to reaction.  7. Diabetes.  8. Hyperlipidemia.  9. History of hypertension.   PLAN OF TREATMENT:  1. Patient will be admitted to medical floor. Will start doxycycline 100 mg IV b.i.d. and follow up CBC, CRP, blood culture, [facility] spotted fever IgG. Also will get ID consult from Dr. Clayborn Bigness.  2. For hypotension and acute renal failure, will hold hypertension medication including Atenolol and chlorthalidone. Start IV normal saline at 125 mL/h and follow up BMP.  3. For diabetes, will start sliding scale and follow up hemoglobin A1c.   4. GI and deep vein thrombosis prophylaxis.   Discussed the patient's situation and the plan of treatment with patient and the patient's son.   TIME SPENT: About 62 minutes.  ____________________________ Demetrios Loll, MD qc:cms D: 04/21/2012 13:43:51 ET T: 04/21/2012 14:09:31 ET  JOB#: [id number] cc: Valetta Close, MD Demetrios Loll MD ELECTRONICALLY SIGNED 04/21/2012 14:28

## 2014-10-23 NOTE — Discharge Summary (Signed)
PATIENT NAME:  Maria Hinton, RAUTH MR#:  [id number] DATE OF BIRTH:  [birth year]  DATE OF ADMISSION:  04/21/2012 DATE OF DISCHARGE:  04/26/2012  PRESENTING COMPLAINT: Fever, generalized weakness.   DISCHARGE DIAGNOSES:  1. Inflammatory process, unclear etiology.  2. Type 2 diabetes.  3. Hypertension.   CODE STATUS: FULL CODE.   MEDICATIONS AT DISCHARGE:  1. Glyburide/metformin 5/500, 2 tablets b.i.d.  2. Omeprazole 20 mg daily.  3. Atorvastatin 20 mg at bedtime.  4. Atenolol/chlorthalidone 100/25, 1 tablet twice a day.   DIET: Low sodium, ADA 1800 calorie diet.   FOLLOW UP: Follow up with Dr. Bernie Covey October 31 at 12:00 p.m.   PROCEDURE: Upper GI endoscopy done by Dr. Candace Cruise showed single duodenal polyp incomplete resection. Resector tissue retrieved. Normal esophagus. Normal stomach.   LABORATORY, DIAGNOSTIC AND RADIOLOGICAL DATA:  Labs at discharge: Glucose 339.   CT of the abdomen, pelvis showed no acute abnormality, small nonspecific mediastinal retroperitoneal nodes are noted. If symptoms persist PET CT scan can be performed.   Iron-binding capacity 156, serum iron 27, iron saturation 17. Magnesium 1.3. Potassium 3.7. White count 11.6, hemoglobin and hematocrit 9 and 26.5. Occult feces negative. ANA was negative. ESR 73.   RA titer is 10.8 which is normal. C-reactive protein 120.5.   [facility] spotted fever is negative. IgG is negative. Ehrlichia antibody panel is negative. Urinalysis negative for urinary tract infection. Urine culture negative.   IgM, IgG are negative.   Rickettsial fever antibodies are negative. Blood cultures negative in five days.   Chest x-ray mild hyperinflation. No pneumonia or congestive heart failure.   BRIEF SUMMARY OF HOSPITAL COURSE: Ms. Maria Hinton is a 69 year old Caucasian female with history of hypertension and diabetes comes to the Emergency Room with:  1. Fever/febrile illness/systemic inflammatory response syndrome for three weeks.  Patient had complaints of headache, neck pain generalized body pain, joint pain and weakness for about three weeks. In addition, she was complaining of night sweats. Patient was admitted, started on IV fluids. Her entire work-up as far as tick borne illness along with blood cultures to rule out any bacterial infection were negative. Her only inflammatory markers elevated were CRP and ESR. Patient was initially diagnosed as outpatient by primary care physician with possible Lyme disease, was started on doxycycline, however, her titers and Lyme panel was negative. She was seen by Dr. Clayborn Hinton and recommended to discontinue her doxycycline. Patient remained afebrile in the hospital, although she continued to have night sweats. To complete the work-up for possible rule out occult malignancy CT of the chest, abdomen and pelvis was done which was negative. Patient was advised to follow up with her primary care physician, Dr. Clemmie Hinton, and if her night sweats continue she is also advised to get referral to hematology/oncology for further evaluation.  2. Type 2 diabetes. Patient was continued on her p.o. home medications.  3. Hypertension. Continued on her atenolol and chlorthalidone was resumed prior to discharge.  4. Hyperlipidemia. Atorvastatin was also resumed.  5. Chronic anemia. GI consultation was made with Dr. Candace Cruise who given her anemia he did an EGD that showed normal stomach, normal duodenum and a small duodenal polyp was retrieved. Hemoglobin remained stable.  6. Hospital stay overall remained stable. CODE STATUS: Patient remained a FULL CODE.        TIME SPENT: 40 minutes.  ____________________________ Hart Rochester Posey Pronto, MD sap:cms D: 04/27/2012 07:20:06 ET T: 04/27/2012 09:45:13 ET JOB#: [id number]  cc:  A. Posey Pronto, MD, <Dictator> Luiz Ochoa  Maria Krill, MD Ilda Basset MD ELECTRONICALLY SIGNED 04/27/2012 11:25

## 2014-10-26 NOTE — Discharge Summary (Signed)
PATIENT NAME:  Maria Hinton, Maria Hinton MR#:  [id number] DATE OF BIRTH:  1946-07-03  DATE OF ADMISSION:  11/29/2012  DATE OF DISCHARGE:  12/02/2012  ADMITTING DIAGNOSIS:  Status post left total hip arthroplasty.   HOSPITAL COURSE:  Ms. Stanish was admitted to [facility] on the orthopedic floor on 29/56/2130 after an uncomplicated total hip arthroplasty. She received 24 hours of antibiotics postop. Physical and occupational therapy consults were called for postop day #1. The patient was well-controlled on a morphine PCA. She was started on Lovenox, which began on postop day #1 for DVT prophylaxis.   On postop day #1, the patient was out of bed to a chair. She denied any chest pain, shortness of breath or abdominal pain. She was already passing flatus and tolerating p.o. intake. The patient was using her incentive spirometer. She ambulated with physical therapy to the door of her room, and she had already noted a dramatic improvement in her left hip pain compared to preop. She was pleased with the result of her surgery.   On postop day #2, the patient again was doing well. She had used the restroom using her walker. She had no significant left hip pain. The patient was able to walk to the nurse's station with physical therapy. Family members were visiting at the bedside. The patient continued on her Lovenox, TED stockings and AV1 compression devices for DVT prophylaxis. The patient had daily labs drawn. On postop day #1, she had a hematocrit of 24.3 and a hemoglobin of 8.4. On postop day #2, the patient's hematocrit was 23 and her hemoglobin was 8.1. By postop day #3, the patient's hemoglobin was 8.2 and hematocrit had stabilized at 23.1. Despite the postop anemia, the patient remains hemodynamically stable and asymptomatic. Therefore, no transfusions were given to the patient during her hospitalization.   On postop day #3, the patient again was doing well. Her pain was well-controlled on  p.o. analgesia. She was making excellent progress with physical therapy, and had a bowel movement. Given the patient's clinical improvement, she was prepared for discharge at this time. Her Foley catheter had been removed on postop day #2. Her first dressing change was also on postop day #2, and it was changed again on postop day #3. The incision was clean, dry and intact. Her thigh was soft and compressible, as was her lower leg compartments. She had palpable pedal pulses and intact sensation to light touch throughout. Her motor and sensory exam was normal. She was able to dorsi and plantar flex her ankle, and flex and extend all 5 toes. She had 5 out of 5 strength in all muscle groups to manual testing in the left lower leg. The patient had no calf tenderness during her daily exams in the hospital.   DISCHARGE INSTRUCTIONS: The patient will be discharged home, given her excellent progress with physical therapy. She will receive home health services, including home health physical therapy. She will continue working on lower extremity strengthening, gait training and hip range of motion. She will remain on posterior hip precautions, and should continue to wear her TED stockings for the next 1 to 2 weeks to help reduce lower extremity edema. The patient will remain on Lovenox 40 mg daily for 4 weeks postop. She will continue using her incentive spirometer. She may resume a regular diet. She will follow up in my office in 1 to 2 weeks for a wound check and staple removal.    ____________________________ Timoteo Gaul, MD  klk:mr D: 12/12/2012 18:21:00 ET T: 12/12/2012 19:32:23 ET JOB#: [id number]  cc: Timoteo Gaul, MD, <Dictator> Timoteo Gaul MD ELECTRONICALLY SIGNED 12/27/2012 18:19

## 2014-10-26 NOTE — Op Note (Signed)
PATIENT NAME:  Maria Hinton, Maria Hinton MR#:  [id number] DATE OF BIRTH:  04/17/1946  DATE OF PROCEDURE:  11/29/2012  PREOPERATIVE DIAGNOSIS: Left hip osteoarthritis.   POSTOPERATIVE DIAGNOSIS: Left hip osteoarthritis.   PROCEDURE: Left total hip arthroplasty.   SURGEON: Thornton Park, M.D.   ASSISTANT: Earnestine Leys, M.D.   ANESTHESIA: General.   ESTIMATED BLOOD LOSS: 300 mL.   COMPLICATIONS: None.   IMPLANTS:  Stryker Accolade TMZF size 3 stem, 52 mm Stryker Trident acetabular cluster shell, 0 degree X3 Trident polyethylene liner and a 36 mm, -5 LFIT anatomic V40 taper head, 2 times Torx cancellous bone screws.   INDICATIONS FOR PROCEDURE: Ms. Maria Hinton is a 69 year old female who has had persistent left hip pain for approximately 10 months after sustaining a twisting injury to the left hip. She had pain with ambulation and activities of daily living. She had significant loss of hip range of motion. She had not responded to nonoperative management. An MRI had demonstrated bony edema on both sides of the joint. Her plain films demonstrated a lateral marginal osteophyte off the acetabulum with significant joint space narrowing especially over the superior lateral hip joint. The patient wished to proceed with surgery.    I reviewed the risks and benefits of surgery with the patient in my office prior to the date of surgery. She understood the risks of undergoing a total hip arthroplasty, including infection requiring the removal of the prosthesis, bleeding requiring blood transfusion, fracture, dislocation, leg length discrepancy, change in lower extremity rotation, persistent hip pain or weakness, nerve or blood vessel injury especially injury to the sciatic nerve leading to foot drop, and the need for further surgery. Medical risks include deep vein thrombosis and pulmonary embolism, myocardial infarction, stroke, pneumonia, respiratory failure, and death. The patient understood these risks and wished  to proceed.   PROCEDURE NOTE: The patient was met in the preoperative area. I updated the patient's history and physical. I signed the left leg, according the hospital's right site protocol. I signed the left leg within the operative field after verbally confirming with the patient that this was the correct site of surgery. I also confirmed this with my office notes and the patient's radiographic studies.   The patient elected to undergo general anesthesia. She refused a spinal anesthetic.   The patient was brought to the Operating Room where she was placed supine on the operative table. She underwent general endotracheal intubation. She was then placed in a right lateral decubitus position. An axillary roll was placed under her right side and adequate padding was placed around the right common peroneal nerve to avoid compression during the case. All bony prominences were adequately padded. The patient was then prepped and draped in a sterile fashion.   A timeout was performed to verify the patient's name, date of birth, medical record number, correct site of surgery, and correct procedure to be performed. It was also used to verify the patient had received antibiotics and that all appropriate instruments, implants, and radiographic studies were available in the room. Once all in attendance were in agreement, the case began.   Proposed incision based over the greater trochanter was drawn out based upon bony landmarks. A #10 blade was then used to make a curvilinear incision over the greater trochanter. The subcutaneous tissues were incised with a deep #10 blade and electrocautery. Electrocautery was used to coagulate all bleeding vessels during exposure. The patient had the fascia lata identified and then this was sharply incised using a  deep #10 blade. The underlying hip bursa was then identified and carefully resected. The underlying external rotators were then identified. A tonsil clamp was situated  under the external rotators and above the capsule to separate this plane. The external rotators were then carefully detached from their posterior attachment on the greater trochanter. The conjoined tendon and piriformis tendons were tagged with a #2 Tycron for later repair. They were reflected posteriorly to protect the sciatic nerve. The underlying capsule was then easily visualized. A T-shaped capsulotomy was then performed. Each leaflet of the capsule was tagged with a #2 Tycron, again for later repair. The hip was then carefully dislocated, 15 mm above the lesser trochanter was marked. The accolade femoral stem cutting guide was then placed alongside the femur. A line was marked at the level for the osteotomy. An oscillating saw  was used to make the proximal osteotomy. The head was measured to be approximately 47 mm in diameter. The hip joint was then copiously irrigated with pulse lavage using GU impregnated saline. This was then suctioned out.  Straight Hohmann retractors were placed both over the anterior and posterior acetabulum. All remaining labrum was carefully resected. The pulvinar was also resected using electrocautery. The patient had a stenotic teardrop. A size 44 reamer was then used to medialize the acetabulum initially. The reaming baskets were then increased by 2 mm at a time, positioning them with adequate flexion and abduction. Excellent fit was achieved with a 51 mm basket which allowed for a size 52 hemispherical cuff. The trial was then implanted and had an excellent fit. The trial cuff was then removed. The actual size 52 Trident cluster hole acetabular shell was then implanted and, again using an external alignment guide for adequate flexion and abduction positioning. This was malleted firmly into place and bottomed out. The inserter was then removed. A Frazier-tip suction was used within the cluster holes to make sure that the acetabular shell was firmly seated. A flexible drill was used  to place 2 Torx cancellous bone screws; one size 30 mm one size 25 mm.  The depth gauge was used to measure the depth of these screws and manual palpation was used at the tip of the depth gauge to ensure the screws did not penetrate beyond the cancellous bone. These Torx screws were placed on a hand. A central hole eliminator was also used in the  position in the central part of the shell. A trial liner was then placed in the acetabulum. The attention was then turned to femoral preparation.   The hip was internally rotated and flexed approximately 45 degrees. A femoral hip skid along with a Kober retractor was placed to allow adequate visualization of the proximal femur. The soft tissue was removed from the posterior aspect of the greater trochanter. A box osteotome was then used to make the initial entry into the proximal femur. A single sharp hand reamer was used to open the femoral canal, and a femoral canal sounder was utilized to ensure no penetration of the femoral cortex was achieved.   Sequential broaches starting with a size 0 anteverted approximately 10 to 15 degrees was then malleted carefully into position. The broaches went up by half sizes until a size 3 broach was found to have the best medial and lateral fit. The trial size 3 broach was left in place. 132 degree trial neck liner and a size 0 head 36 mm head was then reduced into socket. This was felt to be slightly  tight and elongated the patient slightly. Therefore, a -5 head was then trialed and found to have excellent range of motion and stability. All trial components were then removed. The hip was then copiously irrigated. The actual Stryker Trident X3 0-degree polyethylene liner was then implanted and impacted with the ball impactor. It was confirmed that this had a snug fit within the acetabular shell. It was concentric within the shell and showed no evidence of being loose.   The actual Stryker Accolade TMZF size 3 stem was then  carefully malleted into position. Again, the -5 head trial was placed and the hip taken through a full range of motion including flexion and extension and external rotation. The internal rotation in adduction also was confirmed to be stable. Given the stability and excellent range of motion, the trial -5 head was removed and the actual 36 mm -5 LFIT anatomic V40 taper head was then impacted onto the 132-degree neck angle trunnion. The hip joint again was copiously irrigated. The hip was then carefully reduced.   The capsule was then repaired using #2 Tycron and was attached to the posterior aspect of the greater trochanter via drill holes. The conjoined tendon was also attached to the greater trochanter via drill holes posteriorly and the initial #2 Tycron tagging suture was tied down into position. Finally, the piriformis was repaired using a soft tissue repair to the adductor tendon. The hip joint was copiously irrigated. The fascia lata was closed with 0 Vicryl in an interrupted fashion. Again, the wound was copiously irrigated. The subcutaneous tissue was closed in two layers with 2-0 Vicryl. This prevented the formation of any potential dead space. Skin was closed with a running 3-0 Monocryl. Steri-Strips were applied along with Xeroform and 4 x 4 dressing with ABDs. Foam tape was then used to seal the dressing over the left hip. The patient was then rolled onto her back on the operative table. The leg lengths were found to be equivalent. A small abduction pillow was placed between her legs, and the patient was transferred to a hospital bed in stable condition once extubated. She was then brought to the PACU in stable condition. I was present and scrubbed for the entire case and all sharp and instrument counts were correct at the conclusion of the case.  I spoke with the patient's family in the postoperative consultation room to let them know the patient was stable in recovery and the case had gone without  complication.    ____________________________ Timoteo Gaul, MD klk:rw D: 11/29/2012 12:23:36 ET T: 11/29/2012 12:55:34 ET JOB#: [id number]  cc: Timoteo Gaul, MD, <Dictator> Timoteo Gaul MD ELECTRONICALLY SIGNED 12/01/2012 12:29

## 2014-10-31 ENCOUNTER — Ambulatory Visit
Admit: 2014-10-31 | Disposition: A | Discharge status: Home / Self Care | Payer: Self-pay | Attending: Orthopedic Surgery | Admitting: Orthopedic Surgery

## 2014-11-12 ENCOUNTER — Other Ambulatory Visit: Payer: Self-pay | Admitting: Nurse Practitioner

## 2014-11-12 NOTE — Telephone Encounter (Signed)
Last refill 4.1.16.  Please advise refill

## 2014-11-13 NOTE — Telephone Encounter (Signed)
Ok to refill,  Refill sent  

## 2014-11-16 ENCOUNTER — Encounter: Payer: Self-pay | Admitting: Internal Medicine

## 2014-12-10 ENCOUNTER — Other Ambulatory Visit: Payer: Self-pay | Admitting: Internal Medicine

## 2014-12-18 ENCOUNTER — Encounter: Payer: Self-pay | Admitting: Internal Medicine

## 2014-12-18 ENCOUNTER — Ambulatory Visit (INDEPENDENT_AMBULATORY_CARE_PROVIDER_SITE_OTHER): Payer: Medicare Other | Admitting: Internal Medicine

## 2014-12-18 VITALS — BP 122/70 | HR 61 | Temp 97.1°F | Ht 64.0 in | Wt 173.0 lb

## 2014-12-18 DIAGNOSIS — J449 Chronic obstructive pulmonary disease, unspecified: Secondary | ICD-10-CM | POA: Diagnosis not present

## 2014-12-18 DIAGNOSIS — J984 Other disorders of lung: Secondary | ICD-10-CM | POA: Diagnosis not present

## 2014-12-18 DIAGNOSIS — J4489 Other specified chronic obstructive pulmonary disease: Secondary | ICD-10-CM

## 2014-12-18 DIAGNOSIS — Z716 Tobacco abuse counseling: Secondary | ICD-10-CM

## 2014-12-18 MED ORDER — ALBUTEROL SULFATE HFA 108 (90 BASE) MCG/ACT IN AERS: 2.0000 | INHALATION_SPRAY | Freq: Four times a day (QID) | RESPIRATORY_TRACT | Status: DC | PRN

## 2014-12-18 MED ORDER — FLUTICASONE-SALMETEROL 500-50 MCG/DOSE IN AEPB: 1.0000 | INHALATION_SPRAY | Freq: Two times a day (BID) | RESPIRATORY_TRACT | Status: DC

## 2014-12-18 MED ORDER — PREDNISONE 20 MG PO TABS: 20.0000 mg | ORAL_TABLET | Freq: Every day | ORAL | Status: DC

## 2014-12-18 NOTE — Assessment & Plan Note (Signed)
Tobacco Cessation - Counseling regarding benefits of smoking cessation strategies was provided for more than 12 min. - Educated that at this time smoking- cessation represents the single most important step that patient can take to enhance the length and quality of live. - Educated patient regarding alternatives of behavior interventions, pharmacotherapy including NRT and non-nicotine therapy such, and combinations of both. - has quit smoking since May 1, I've commended her on this and advised to continue with tobacco cessation.

## 2014-12-18 NOTE — Assessment & Plan Note (Signed)
Shown with history of COPD B, Moderate airflow limitation (GOLD STAGE I) today she still symptomatic from her recent upper spray tract infection, I believe that chronic exposure to secondhand smoke as needed recovery time slowed. At this time given a prescription for prednisone 20 mg, 1 tab daily 5 days.  Plan Given her recent symptoms, I believe that step up therapy is needed at this time, will add Advair 500/50 to the patient's current COPD regiment. She is advised to take 1 puff twice daily, and gargle and rinse after each use. Continue with Spiriva. Prednisone 20 mg daily 5 days. Avoid any secondhand smoke exposure as much as possible

## 2014-12-18 NOTE — Progress Notes (Signed)
MRN# [id number] Maria Hinton 06/20/1946   CC: Chief Complaint  Patient presents with  . Follow-up    Pt here f/u sob; 12/01/14 pt was seen at Urgent care 02 was 81 % she received neb txt. She quit smoking 6 wks ago. Pt is still using Spiriva. She has cough,sob,wheezing, and fatigue. Pt denies chest tightness.      Brief History: Initial visit on 02/21/2014 Patient is a pleasant 69 year old female presenting in the clinic today for reestablish care and management of her asthma. History is per the patient.  Patient states that she was diagnosed with asthma in 1995 she had a bad bout of bronchitis, during that time she was placed on prednisone for her bronchitis where she continued to have cough and wheezing. She stated she saw a pulmonologist at that time and was told based on a PFT and symptoms that she had asthma she was managed briefly with Advair for a few months and albuterol rescue inhaler; she stated after about 4 months she was weaned off the Advair and has only been on rescue inhaler. She states that currently she has occasional wheezing and sometimes shortness of breath with exertion. He states that she can walk a mile before having any major dyspnea; off note she had hip replacement done last year and has not been able to walk like she used to. In the last year she has had no ER visits, no urgent care visits, no use of prednisone, no intubation, no antibiotics for asthma. She has never been intubated for an asthma exacerbation. She states that she will use her rescue inhaler maybe 1-2 times a 6 month period. Her triggers are wood filings and saw dust for asthma. She previously smoked one pack per day for 36 years, in between that she quit for about 13 years, but resumed smoking 5 years ago when her husband passed away; currently she smokes 5-6 cigarettes per day. Patient admits she has 2 dogs at home that stay inside with her. She admits to morning symptoms of smoker's cough (described as  clear thick sputum production), mild wheeze, and mild shortness of breath.  Patient stated that when she was about 69 years old she had a surgery done a left chest wall for abnormal veins, stated she had 3 ribs taken out at that time. -At this visit and further workup a pulmonary function testing, repeat CAT scan of chest, and followup.   [name] 04/05/15  Patient presents today for followup of her CT chest and pulmonary function testing results. She states that she's currently smoking 1.5 packs per day, which is an increase since her last visit. She states that she's had some recent stressors, grandson recent surgery for a brain tumor, her mother was recently ill. Patient states that she still has some mild to moderate shortness of breath and dyspnea on exertion, but has not been using albuterol (she does not feel that she needs). Patient still endorses a morning cough with mild thick whitish sputum. Today her CAT score is 10, and her mMRC is 1 Plan - tobacco cessation, start spiriva.     Events since last clinic visit: Patient presents today for an acute care visit for acute sick visit. Patient stated that in the middle of May she went to the beach shortly after which she had a upper history tract infection where she was seen at a local urgent care had a chest x-ray done that showed scar tissue, her oxygen level during that visit was  noted to be at 81%, she was given nebulizers 2 which gradually improved her oxygen level. Also after that urgent care visit she was given doxycycline and cough pills for her symptoms. Since her last visit she's also quit smoking, but she is exposed significantly from secondhand smoke from other roommates. She's currently on Spiriva which was doing fairly well for her. She states that she's now having cough in the morning and congestion, she states that her recovery from May has been very slow and she is still fatigued and still has moderate amount of cough. Patient  states that she's also using albuterol 3-4 times per day.     Medication:   Current Outpatient Rx  Name  Route  Sig  Dispense  Refill  . albuterol (PROVENTIL HFA;VENTOLIN HFA) 108 (90 BASE) MCG/ACT inhaler   Inhalation   Inhale 2 puffs into the lungs every 6 (six) hours as needed for wheezing.   6.7 g   11   . ALPRAZolam (XANAX) 0.25 MG tablet      TAKE 1 TABLET BY MOUTH AS NEEDED FOR SLEEP   30 tablet   5     Not to exceed 5 additional fills before 02/26/2015   . atenolol (TENORMIN) 50 MG tablet   Oral   Take 1 tablet (50 mg total) by mouth 2 (two) times daily.   180 tablet   1   . atorvastatin (LIPITOR) 40 MG tablet   Oral   Take 1 tablet (40 mg total) by mouth daily at 6 PM.   90 tablet   1   . Blood Glucose Monitoring Suppl (ONE TOUCH ULTRA SYSTEM KIT) W/DEVICE KIT      Pt needs OneTouch Ultra Blue glucometer dx 250.02   1 each   0   . glipiZIDE (GLUCOTROL) 10 MG tablet      TAKE 1 TABLET (10 MG TOTAL) BY MOUTH 2 (TWO) TIMES DAILY BEFORE A MEAL.   60 tablet   5   . glucose blood (ONE TOUCH ULTRA TEST) test strip      Use as instructed bid, has OneTouch Ultra Blue  dx: 250.02   100 each   3   . hydrochlorothiazide (HYDRODIURIL) 25 MG tablet   Oral   Take 1 tablet (25 mg total) by mouth daily.   90 tablet   2   . Insulin Detemir (LEVEMIR) 100 UNIT/ML Pen   Subcutaneous   Inject 10-13 Units into the skin 2 (two) times daily. 10 units in the morning, 13 units at night         . Lancets (ONETOUCH ULTRASOFT) lancets      Use as instructed bid dx 250.02   100 each   3   . meloxicam (MOBIC) 7.5 MG tablet      TAKE 1 TABLET BY MOUTH EVERY DAY   30 tablet   5     As needed for joint pain   . metFORMIN (GLUCOPHAGE) 1000 MG tablet   Oral   Take 1 tablet (1,000 mg total) by mouth 2 (two) times daily with a meal.   180 tablet   3   . omeprazole (PRILOSEC) 20 MG capsule   Oral   Take 1 capsule (20 mg total) by mouth daily.   90 capsule   1    . tiotropium (SPIRIVA) 18 MCG inhalation capsule   Inhalation   Place 1 capsule (18 mcg total) into inhaler and inhale daily.   30 capsule   12   .  Fluticasone-Salmeterol (ADVAIR) 500-50 MCG/DOSE AEPB   Inhalation   Inhale 1 puff into the lungs 2 (two) times daily.   60 each   2   . predniSONE (DELTASONE) 20 MG tablet   Oral   Take 1 tablet (20 mg total) by mouth daily with breakfast.   5 tablet   0      Review of Systems: Gen:  Denies  fever, sweats, chills HEENT: Denies blurred vision, double vision, ear pain, eye pain, hearing loss, nose bleeds, sore throat Cvc:  No dizziness, chest pain or heaviness Resp:   Admits to: Cough and shortness of breath Gi: Denies swallowing difficulty, stomach pain, nausea or vomiting, diarrhea, constipation, bowel incontinence Gu:  Denies bladder incontinence, burning urine Ext:   No Joint pain, stiffness or swelling Skin: No skin rash, easy bruising or bleeding or hives Endoc:  No polyuria, polydipsia , polyphagia or weight change Other:  All other systems negative  Allergies:  Vicodin  Physical Examination:  VS: BP 122/70 mmHg  Pulse 61  Temp(Src) 97.1 F (36.2 C) (Oral)  Ht 5' 4"  (1.626 m)  Wt 173 lb (78.472 kg)  BMI 29.68 kg/m2  SpO2 96%  General Appearance: No distress  HEENT: PERRLA, no ptosis, no other lesions noticed Pulmonary: Coarse upper airway sounds with inspiration, mild fine basilar 3 wheezes. Cardiovascular:  Normal S1,S2.  No m/r/g.     Abdomen:Exam: Benign, Soft, non-tender, No masses  Skin:   warm, no rashes, no ecchymosis  Extremities: normal, no cyanosis, clubbing, warm with normal capillary refill.      Assessment and Plan: A 69 year old female past medical history of COPD seen in consultation for acute sick visit. COPD type B Shown with history of COPD B, Moderate airflow limitation (GOLD STAGE I) today she still symptomatic from her recent upper spray tract infection, I believe that chronic exposure to  secondhand smoke as needed recovery time slowed. At this time given a prescription for prednisone 20 mg, 1 tab daily 5 days.  Plan Given her recent symptoms, I believe that step up therapy is needed at this time, will add Advair 500/50 to the patient's current COPD regiment. She is advised to take 1 puff twice daily, and gargle and rinse after each use. Continue with Spiriva. Prednisone 20 mg daily 5 days. Avoid any secondhand smoke exposure as much as possible    Tobacco abuse counseling Tobacco Cessation - Counseling regarding benefits of smoking cessation strategies was provided for more than 12 min. - Educated that at this time smoking- cessation represents the single most important step that patient can take to enhance the length and quality of live. - Educated patient regarding alternatives of behavior interventions, pharmacotherapy including NRT and non-nicotine therapy such, and combinations of both. - has quit smoking since May 1, I've commended her on this and advised to continue with tobacco cessation.      Updated Medication List Outpatient Encounter Prescriptions as of 12/18/2014  Medication Sig  . albuterol (PROVENTIL HFA;VENTOLIN HFA) 108 (90 BASE) MCG/ACT inhaler Inhale 2 puffs into the lungs every 6 (six) hours as needed for wheezing.  Marland Kitchen ALPRAZolam (XANAX) 0.25 MG tablet TAKE 1 TABLET BY MOUTH AS NEEDED FOR SLEEP  . atenolol (TENORMIN) 50 MG tablet Take 1 tablet (50 mg total) by mouth 2 (two) times daily.  Marland Kitchen atorvastatin (LIPITOR) 40 MG tablet Take 1 tablet (40 mg total) by mouth daily at 6 PM.  . Blood Glucose Monitoring Suppl (ONE TOUCH ULTRA SYSTEM KIT) W/DEVICE  KIT Pt needs OneTouch Ultra Blue glucometer dx 250.02  . glipiZIDE (GLUCOTROL) 10 MG tablet TAKE 1 TABLET (10 MG TOTAL) BY MOUTH 2 (TWO) TIMES DAILY BEFORE A MEAL.  Marland Kitchen glucose blood (ONE TOUCH ULTRA TEST) test strip Use as instructed bid, has OneTouch Ultra Blue  dx: 250.02  . hydrochlorothiazide  (HYDRODIURIL) 25 MG tablet Take 1 tablet (25 mg total) by mouth daily.  . Insulin Detemir (LEVEMIR) 100 UNIT/ML Pen Inject 10-13 Units into the skin 2 (two) times daily. 10 units in the morning, 13 units at night  . Lancets (ONETOUCH ULTRASOFT) lancets Use as instructed bid dx 250.02  . meloxicam (MOBIC) 7.5 MG tablet TAKE 1 TABLET BY MOUTH EVERY DAY  . metFORMIN (GLUCOPHAGE) 1000 MG tablet Take 1 tablet (1,000 mg total) by mouth 2 (two) times daily with a meal.  . omeprazole (PRILOSEC) 20 MG capsule Take 1 capsule (20 mg total) by mouth daily.  Marland Kitchen tiotropium (SPIRIVA) 18 MCG inhalation capsule Place 1 capsule (18 mcg total) into inhaler and inhale daily.  . [DISCONTINUED] albuterol (PROVENTIL HFA;VENTOLIN HFA) 108 (90 BASE) MCG/ACT inhaler Inhale 2 puffs into the lungs every 6 (six) hours as needed for wheezing.  . [DISCONTINUED] ondansetron (ZOFRAN) 4 MG tablet Take 1 tablet (4 mg total) by mouth every 8 (eight) hours as needed for nausea or vomiting.  . Fluticasone-Salmeterol (ADVAIR) 500-50 MCG/DOSE AEPB Inhale 1 puff into the lungs 2 (two) times daily.  . predniSONE (DELTASONE) 20 MG tablet Take 1 tablet (20 mg total) by mouth daily with breakfast.  . [DISCONTINUED] doxycycline (VIBRA-TABS) 100 MG tablet Take 1 tablet (100 mg total) by mouth 2 (two) times daily. (Patient not taking: Reported on 12/18/2014)  . [DISCONTINUED] predniSONE (DELTASONE) 10 MG tablet Take 3 tabs on days 1-2, take 2 tabs on days 3-4, take 1 tab on days 5-6   No facility-administered encounter medications on file as of 12/18/2014.    Orders for this visit: No orders of the defined types were placed in this encounter.    Thank  you for the visitation and for allowing  [facility] to assist in the care of your patient. Our recommendations are noted above.  Please contact us if we can be of further service.  Vilinda Boehringer, MD [facility] Office Number: [phone]

## 2014-12-18 NOTE — Patient Instructions (Addendum)
Follow up with Dr. Stevenson Clinch in 3 months.  -Prednisone 20 mg: 1 tab daily with breakfast for 5 days. -Advair 500/50-1 puff in the morning and 1 puff in the evening: gargle and rinse after each use -Continue with Spiriva -Avoid secondhand smoke as much as possible -Continue diet and exercise

## 2015-01-01 LAB — HM DIABETES EYE EXAM

## 2015-01-02 ENCOUNTER — Ambulatory Visit: Payer: Medicare Other | Admitting: Internal Medicine

## 2015-01-03 ENCOUNTER — Encounter: Payer: Self-pay | Admitting: Internal Medicine

## 2015-01-03 ENCOUNTER — Ambulatory Visit (INDEPENDENT_AMBULATORY_CARE_PROVIDER_SITE_OTHER): Payer: Medicare Other | Admitting: Internal Medicine

## 2015-01-03 ENCOUNTER — Encounter: Payer: Self-pay | Admitting: *Deleted

## 2015-01-03 DIAGNOSIS — I10 Essential (primary) hypertension: Secondary | ICD-10-CM | POA: Diagnosis not present

## 2015-01-03 DIAGNOSIS — Z716 Tobacco abuse counseling: Secondary | ICD-10-CM | POA: Diagnosis not present

## 2015-01-03 DIAGNOSIS — E1165 Type 2 diabetes mellitus with hyperglycemia: Secondary | ICD-10-CM

## 2015-01-03 DIAGNOSIS — IMO0002 Reserved for concepts with insufficient information to code with codable children: Secondary | ICD-10-CM

## 2015-01-03 DIAGNOSIS — Z87891 Personal history of nicotine dependence: Secondary | ICD-10-CM

## 2015-01-03 DIAGNOSIS — M542 Cervicalgia: Secondary | ICD-10-CM

## 2015-01-03 DIAGNOSIS — H01006 Unspecified blepharitis left eye, unspecified eyelid: Secondary | ICD-10-CM | POA: Insufficient documentation

## 2015-01-03 LAB — COMPREHENSIVE METABOLIC PANEL
ALT: 27 U/L (ref 0–35)
AST: 21 U/L (ref 0–37)
Albumin: 4.1 g/dL (ref 3.5–5.2)
Alkaline Phosphatase: 105 U/L (ref 39–117)
BUN: 16 mg/dL (ref 6–23)
CO2: 30 mEq/L (ref 19–32)
Calcium: 9.2 mg/dL (ref 8.4–10.5)
Chloride: 100 mEq/L (ref 96–112)
Creatinine, Ser: 1.06 mg/dL (ref 0.40–1.20)
GFR: 54.97 mL/min — ABNORMAL LOW (ref >60.00)
Glucose, Bld: 153 mg/dL — ABNORMAL HIGH (ref 70–99)
Potassium: 4.2 mEq/L (ref 3.5–5.1)
Sodium: 141 mEq/L (ref 135–145)
Total Bilirubin: 0.4 mg/dL (ref 0.2–1.2)
Total Protein: 6.8 g/dL (ref 6.0–8.3)

## 2015-01-03 LAB — HEMOGLOBIN A1C: Hgb A1c MFr Bld: 9.2 % — ABNORMAL HIGH (ref 4.6–6.5)

## 2015-01-03 MED ORDER — ERYTHROMYCIN 5 MG/GM OP OINT
1.0000 | TOPICAL_OINTMENT | Freq: Three times a day (TID) | OPHTHALMIC | Status: DC
Start: 2015-01-03 — End: 2015-02-18

## 2015-01-03 MED ORDER — DIAZEPAM 10 MG PO TABS
10.0000 mg | ORAL_TABLET | Freq: Two times a day (BID) | ORAL | Status: DC | PRN
Start: 2015-01-03 — End: 2017-04-05

## 2015-01-03 MED ORDER — ALPRAZOLAM 0.25 MG PO TABS: ORAL_TABLET | ORAL | Status: DC

## 2015-01-03 NOTE — Patient Instructions (Addendum)
Please consider changing your daily Boost to a lower carb, high protein shake such as:   Atkins EAS Advantage Carb Control Premier Protein  Muscle Milk Glucerna   You do NOT have an aneurysm in your brain.  The CT angiogram was done AFTER the MRI and ruled this out.  Your head pain is coming from the arthritis in your cervical spine pinching a nerve and causing referred pain (neuralgia) to your scalp  You can try taking valium as a muscle relaxer when this happens

## 2015-01-03 NOTE — Progress Notes (Signed)
Subjective:  Patient ID: Maria Hinton, female    DOB: 1945-11-02  Age: 69 y.o. MRN: [id number]  CC: Diagnoses of Essential hypertension, DM (diabetes mellitus), type 2, uncontrolled, Tobacco abuse counseling, Cervicalgia, and History of tobacco abuse were pertinent to this visit.  HPI Maria Hinton presents for follow up on uncontrolled DM.  Blood sugars have bene running high for weeks,  Has not called.  Went to urgent care on May 28 with COPD exacerbation and acute respiratory failure WITH ROOM AIR 02 SATS of 81%   Given albuterol nebs and treated with prednisone 20 mg  X 5 days  .  Seen by Pulmonology Mungal, spiriva advair added .  Quit smoking a few months ago, but lives with 3 smokers who continue to smoke in the house.  She spends 95% of her time sequestered in her room with her dog.    Under a lot of stress,  Using 0.25 mg xanax at night .  Needs an occasional one during the day,  Due to the stress of living with family members who also do not work.  She is drinking Boost every morning, eats a  half sandwich for lunch.  Not checking blood sugars.   Seeing Nudelman for back pain.    Headaches, chronic: she underwent an MRA followed by CT angiogram of head to rule out suggestion of an aneurysm.   She continues to have episodes of stabbing pains in left parietal and temporal area.    Outpatient Prescriptions Prior to Visit  Medication Sig Dispense Refill  . albuterol (PROVENTIL HFA;VENTOLIN HFA) 108 (90 BASE) MCG/ACT inhaler Inhale 2 puffs into the lungs every 6 (six) hours as needed for wheezing. 6.7 g 11  . atenolol (TENORMIN) 50 MG tablet Take 1 tablet (50 mg total) by mouth 2 (two) times daily. 180 tablet 1  . atorvastatin (LIPITOR) 40 MG tablet Take 1 tablet (40 mg total) by mouth daily at 6 PM. 90 tablet 1  . Blood Glucose Monitoring Suppl (ONE TOUCH ULTRA SYSTEM KIT) W/DEVICE KIT Pt needs OneTouch Ultra Blue glucometer dx 250.02 1 each 0  . Fluticasone-Salmeterol (ADVAIR) 500-50  MCG/DOSE AEPB Inhale 1 puff into the lungs 2 (two) times daily. 60 each 2  . glipiZIDE (GLUCOTROL) 10 MG tablet TAKE 1 TABLET (10 MG TOTAL) BY MOUTH 2 (TWO) TIMES DAILY BEFORE A MEAL. 60 tablet 5  . glucose blood (ONE TOUCH ULTRA TEST) test strip Use as instructed bid, has OneTouch Ultra Blue  dx: 250.02 100 each 3  . hydrochlorothiazide (HYDRODIURIL) 25 MG tablet Take 1 tablet (25 mg total) by mouth daily. 90 tablet 2  . Insulin Detemir (LEVEMIR) 100 UNIT/ML Pen Inject 10-13 Units into the skin 2 (two) times daily. 10 units in the morning, 13 units at night    . Lancets (ONETOUCH ULTRASOFT) lancets Use as instructed bid dx 250.02 100 each 3  . meloxicam (MOBIC) 7.5 MG tablet TAKE 1 TABLET BY MOUTH EVERY DAY 30 tablet 5  . metFORMIN (GLUCOPHAGE) 1000 MG tablet Take 1 tablet (1,000 mg total) by mouth 2 (two) times daily with a meal. 180 tablet 3  . omeprazole (PRILOSEC) 20 MG capsule Take 1 capsule (20 mg total) by mouth daily. 90 capsule 1  . tiotropium (SPIRIVA) 18 MCG inhalation capsule Place 1 capsule (18 mcg total) into inhaler and inhale daily. 30 capsule 12  . ALPRAZolam (XANAX) 0.25 MG tablet TAKE 1 TABLET BY MOUTH AS NEEDED FOR SLEEP 30 tablet 5  .  predniSONE (DELTASONE) 20 MG tablet Take 1 tablet (20 mg total) by mouth daily with breakfast. (Patient not taking: Reported on 01/03/2015) 5 tablet 0   No facility-administered medications prior to visit.    Review of Systems;  Patient denies headache, fevers, malaise, unintentional weight loss, skin rash, eye pain, sinus congestion and sinus pain, sore throat, dysphagia,  hemoptysis , cough, dyspnea, wheezing, chest pain, palpitations, orthopnea, edema, abdominal pain, nausea, melena, diarrhea, constipation, flank pain, dysuria, hematuria, urinary  Frequency, nocturia, numbness, tingling, seizures,  Focal weakness, Loss of consciousness,  Tremor, insomnia, depression, anxiety, and suicidal ideation.      Objective:  BP 118/70 mmHg  Pulse  72  Temp(Src) 98.2 F (36.8 C) (Oral)  Resp 14  Ht _0  (1.626 m)  Wt 170 lb 8 oz (77.338 kg)  BMI 29.25 kg/m2  SpO2 96%  BP Readings from Last 3 Encounters:  01/03/15 118/70  12/18/14 122/70  09/17/14 140/88    Wt Readings from Last 3 Encounters:  01/03/15 170 lb 8 oz (77.338 kg)  12/18/14 173 lb (78.472 kg)  09/17/14 175 lb 12 oz (79.72 kg)    General appearance: alert, cooperative and appears stated age Ears: normal TM's and external ear canals both ears Throat: lips, mucosa, and tongue normal; teeth and gums normal Neck: no adenopathy, no carotid bruit, supple, symmetrical, trachea midline and thyroid not enlarged, symmetric, no tenderness/mass/nodules Back: symmetric, no curvature. ROM normal. No CVA tenderness. Lungs: clear to auscultation bilaterally Heart: regular rate and rhythm, S1, S2 normal, no murmur, click, rub or gallop Abdomen: soft, non-tender; bowel sounds normal; no masses,  no organomegaly Pulses: 2+ and symmetric Skin: Skin color, texture, turgor normal. No rashes or lesions Lymph nodes: Cervical, supraclavicular, and axillary nodes normal.  Lab Results  Component Value Date   HGBA1C 9.2* 01/03/2015   HGBA1C 7.7* 09/17/2014   HGBA1C 7.0* 05/18/2014    Lab Results  Component Value Date   CREATININE 1.06 01/03/2015   CREATININE 1.04 09/17/2014   CREATININE 1.0 05/18/2014    Lab Results  Component Value Date   WBC 8.2 11/28/2013   HGB 12.9 11/28/2013   HCT 38.2 11/28/2013   PLT 253.0 11/28/2013   GLUCOSE 153* 01/03/2015   CHOL 163 09/17/2014   TRIG 297.0* 09/17/2014   HDL 42.10 09/17/2014   LDLDIRECT 78.0 09/17/2014   LDLCALC 75 05/18/2014   ALT 27 01/03/2015   AST 21 01/03/2015   NA 141 01/03/2015   K 4.2 01/03/2015   CL 100 01/03/2015   CREATININE 1.06 01/03/2015   BUN 16 01/03/2015   CO2 30 01/03/2015   TSH 1.31 11/28/2013   INR 0.9 11/21/2012   HGBA1C 9.2* 01/03/2015   MICROALBUR 2.7* 01/03/2015    Mr L Spine Ltd W/o  Cm  10/31/2014   CLINICAL DATA:  Low back pain into the right groin.  EXAM: MRI LUMBAR SPINE WITHOUT CONTRAST  TECHNIQUE: Multiplanar, multisequence MR imaging of the lumbar spine was performed. No intravenous contrast was administered.  COMPARISON:  08/20/2011  FINDINGS: No marrow signal abnormality suggestive of fracture, infection, or neoplasm. T1 and T2 hyperintense structure in the L4 right pedicle and T12 body are consistent with benign and incidental hemangioma. Normal conus signal and morphology. No perispinal abnormality to explain back pain.  Degenerative changes:  T12- L1: Disc narrowing.  No herniation or stenosis.  L1-L2: Disc disc narrowing.  No herniation or stenosis.  L2-L3: Disc bulging and mild degenerative facet and ligament overgrowth. There is mild  narrowing of the lateral recesses which is stable. No nerve compression.  L3-L4: Circumferential disc bulging with superimposed central protrusion. Degenerative ligamentous and facet overgrowth. There is moderate spinal canal stenosis with near complete effacement of the subarachnoid space. Disc and facet spur distorts the bilateral foramen, without neural compression or progression.  L4-L5: Circumferential disc bulging with central disc protrusion that migrates inferiorly. A previously seen right paracentral disc extrusion has been resected or regressed (a right laminotomy was present on the previous study as well). There is right subarticular recess stenosis from residual disc bulge and posterior element overgrowth. Bulging disc and facet overgrowth results in progressive right foraminal stenosis with near complete effacement of perineural fat.  L5-S1:Degenerative disc narrowing with circumferential bulging and a small right paracentral disc herniation. There is facet overgrowth, right greater than left. There is mild bilateral foraminal narrowing without nerve compression. Stable mild narrowing of the right subarticular recess without compression.   IMPRESSION: 1. L4-5 disc extrusion is resolved but residual disc bulge and facet overgrowth still narrows the right subarticular recess. 2. L4-5 moderate right foraminal stenosis which is progressed from 2013. 3. L3-4 borderline advanced spinal canal stenosis, stable from 2013. 4. Noncompressive degenerative changes are noted above.   Electronically Signed   By: Monte Fantasia M.D.   On: 10/31/2014 15:53    Assessment & Plan:   Problem List Items Addressed This Visit      Unprioritized   DM (diabetes mellitus), type 2, uncontrolled    Uncontrolled by current hemoglobin A1c, on maximal doses of glipizide and metformin, now  with proteinuria .  Patient is up-to-date on eye exams and foot exam is normal today. ..  Foot exam done. Diet reviewed.  Meds reviewed.  At last visit we increased her  glipizide to 10 mg bid added  a daily baby aspirin.  continue statin, adding januvia and  an ACE  I for proteinuria  .Marland Kitchen Lab Results  Component Value Date   HGBA1C 9.2* 01/03/2015    Lab Results  Component Value Date   MICROALBUR 2.7* 01/03/2015   Lab Results  Component Value Date   CHOL 163 09/17/2014   HDL 42.10 09/17/2014   LDLCALC 75 05/18/2014   LDLDIRECT 78.0 09/17/2014   TRIG 297.0* 09/17/2014   CHOLHDL 4 09/17/2014              Relevant Medications   sitaGLIPtin (JANUVIA) 100 MG tablet   lisinopril (PRINIVIL,ZESTRIL) 5 MG tablet   Other Relevant Orders   Comprehensive metabolic panel (Completed)   Hemoglobin A1c (Completed)   Microalbumin / creatinine urine ratio   History of tobacco abuse    Smoking cessation instruction/counseling given:  commended patient for quitting and reviewed strategies for preventing relapses      Cervicalgia    With episodes of neuralgia to left side of scalp and left shoulder, prn valium       Tobacco abuse counseling   HTN (hypertension)   Relevant Medications   lisinopril (PRINIVIL,ZESTRIL) 5 MG tablet      I have discontinued Ms.  Gibeault's predniSONE. I am also having her start on erythromycin, diazepam, sitaGLIPtin, and lisinopril. Additionally, I am having her maintain her glucose blood, ONE TOUCH ULTRA SYSTEM KIT, onetouch ultrasoft, Insulin Detemir, hydrochlorothiazide, atenolol, atorvastatin, metFORMIN, omeprazole, tiotropium, meloxicam, glipiZIDE, albuterol, Fluticasone-Salmeterol, and ALPRAZolam.  Meds ordered this encounter  Medications  . erythromycin ophthalmic ointment    Sig: Place 1 application into the left eye 3 (three) times daily.  Dispense:  3.5 g    Refill:  0  . diazepam (VALIUM) 10 MG tablet    Sig: Take 1 tablet (10 mg total) by mouth every 12 (twelve) hours as needed (muscle spasm).    Dispense:  30 tablet    Refill:  1  . ALPRAZolam (XANAX) 0.25 MG tablet    Sig: TAKE 1 TABLET BY MOUTH AS NEEDED FOR SLEEP    Dispense:  30 tablet    Refill:  5    Not to exceed 5 additional fills before 02/26/2015  . sitaGLIPtin (JANUVIA) 100 MG tablet    Sig: Take 1 tablet (100 mg total) by mouth daily.    Dispense:  30 tablet    Refill:  2  . lisinopril (PRINIVIL,ZESTRIL) 5 MG tablet    Sig: Take 1 tablet (5 mg total) by mouth daily.    Dispense:  90 tablet    Refill:  3    Medications Discontinued During This Encounter  Medication Reason  . predniSONE (DELTASONE) 20 MG tablet Completed Course  . ALPRAZolam (XANAX) 0.25 MG tablet Reorder   A total of 40 minutes was spent with patient more than half of which was spent in counseling patient on the above mentioned issues , reviewing and explaining recent labs and imaging studies done, and coordination of care.  Follow-up: Return in about 3 months (around 04/05/2015) for follow up diabetes.   Crecencio Mc, MD

## 2015-01-03 NOTE — Progress Notes (Signed)
Pre-visit discussion using our clinic review tool. No additional management support is needed unless otherwise documented below in the visit note.  

## 2015-01-04 LAB — MICROALBUMIN / CREATININE URINE RATIO
Creatinine,U: 293.1 mg/dL
Microalb Creat Ratio: 0.9 mg/g (ref 0.0–30.0)
Microalb, Ur: 2.7 mg/dL — ABNORMAL HIGH (ref 0.0–1.9)

## 2015-01-06 ENCOUNTER — Encounter: Payer: Self-pay | Admitting: Internal Medicine

## 2015-01-06 ENCOUNTER — Other Ambulatory Visit: Payer: Self-pay | Admitting: Internal Medicine

## 2015-01-06 DIAGNOSIS — E1121 Type 2 diabetes mellitus with diabetic nephropathy: Secondary | ICD-10-CM

## 2015-01-06 DIAGNOSIS — IMO0002 Reserved for concepts with insufficient information to code with codable children: Secondary | ICD-10-CM

## 2015-01-06 DIAGNOSIS — E1165 Type 2 diabetes mellitus with hyperglycemia: Principal | ICD-10-CM

## 2015-01-06 DIAGNOSIS — H01006 Unspecified blepharitis left eye, unspecified eyelid: Secondary | ICD-10-CM | POA: Insufficient documentation

## 2015-01-06 MED ORDER — LISINOPRIL 5 MG PO TABS: 5.0000 mg | ORAL_TABLET | Freq: Every day | ORAL | Status: DC

## 2015-01-06 MED ORDER — SITAGLIPTIN PHOSPHATE 100 MG PO TABS: 100.0000 mg | ORAL_TABLET | Freq: Every day | ORAL | Status: DC

## 2015-01-06 NOTE — Assessment & Plan Note (Signed)
Smoking cessation instruction/counseling given:  commended patient for quitting and reviewed strategies for preventing relapses 

## 2015-01-06 NOTE — Assessment & Plan Note (Signed)
With episodes of neuralgia to left side of scalp and left shoulder, prn valium

## 2015-01-06 NOTE — Assessment & Plan Note (Signed)
Topical erythromycin

## 2015-01-06 NOTE — Assessment & Plan Note (Addendum)
Uncontrolled by current hemoglobin A1c, on maximal doses of glipizide and metformin, now  with proteinuria .  Patient is up-to-date on eye exams and foot exam is normal today. ..  Foot exam done. Diet reviewed.  Meds reviewed.  At last visit we increased her  glipizide to 10 mg bid added  a daily baby aspirin.  continue statin, adding januvia and  an ACE  I for proteinuria  .Marland Kitchen Lab Results  Component Value Date   HGBA1C 9.2* 01/03/2015    Lab Results  Component Value Date   MICROALBUR 2.7* 01/03/2015   Lab Results  Component Value Date   CHOL 163 09/17/2014   HDL 42.10 09/17/2014   LDLCALC 75 05/18/2014   LDLDIRECT 78.0 09/17/2014   TRIG 297.0* 09/17/2014   CHOLHDL 4 09/17/2014

## 2015-01-08 ENCOUNTER — Encounter: Payer: Self-pay | Admitting: *Deleted

## 2015-01-09 ENCOUNTER — Other Ambulatory Visit: Payer: Self-pay | Admitting: Internal Medicine

## 2015-01-16 ENCOUNTER — Other Ambulatory Visit (INDEPENDENT_AMBULATORY_CARE_PROVIDER_SITE_OTHER): Payer: Medicare Other

## 2015-01-16 DIAGNOSIS — E785 Hyperlipidemia, unspecified: Secondary | ICD-10-CM | POA: Diagnosis not present

## 2015-01-16 DIAGNOSIS — E1129 Type 2 diabetes mellitus with other diabetic kidney complication: Secondary | ICD-10-CM

## 2015-01-16 DIAGNOSIS — E1165 Type 2 diabetes mellitus with hyperglycemia: Secondary | ICD-10-CM | POA: Diagnosis not present

## 2015-01-16 DIAGNOSIS — E1121 Type 2 diabetes mellitus with diabetic nephropathy: Secondary | ICD-10-CM

## 2015-01-16 DIAGNOSIS — IMO0002 Reserved for concepts with insufficient information to code with codable children: Secondary | ICD-10-CM

## 2015-01-16 LAB — BASIC METABOLIC PANEL
BUN: 16 mg/dL (ref 6–23)
CO2: 29 mEq/L (ref 19–32)
Calcium: 8.4 mg/dL (ref 8.4–10.5)
Chloride: 104 mEq/L (ref 96–112)
Creatinine, Ser: 1.13 mg/dL (ref 0.40–1.20)
GFR: 51.06 mL/min — ABNORMAL LOW (ref >60.00)
Glucose, Bld: 170 mg/dL — ABNORMAL HIGH (ref 70–99)
Potassium: 3.6 mEq/L (ref 3.5–5.1)
Sodium: 141 mEq/L (ref 135–145)

## 2015-01-16 LAB — LIPID PANEL
Cholesterol: 142 mg/dL (ref 0–200)
HDL: 41 mg/dL (ref >39.00)
LDL Cholesterol: 70 mg/dL (ref 0–99)
NonHDL: 101
Total CHOL/HDL Ratio: 3
Triglycerides: 156 mg/dL — ABNORMAL HIGH (ref 0.0–149.0)
VLDL: 31.2 mg/dL (ref 0.0–40.0)

## 2015-01-16 LAB — LDL CHOLESTEROL, DIRECT: Direct LDL: 74 mg/dL

## 2015-01-20 ENCOUNTER — Encounter: Payer: Self-pay | Admitting: Internal Medicine

## 2015-01-21 ENCOUNTER — Other Ambulatory Visit: Payer: Self-pay | Admitting: Adult Health

## 2015-01-21 MED ORDER — INSULIN DETEMIR 100 UNIT/ML FLEXPEN: 10.0000 [IU] | PEN_INJECTOR | Freq: Two times a day (BID) | SUBCUTANEOUS | Status: DC

## 2015-01-21 NOTE — Addendum Note (Signed)
Addended by: Leeanne Rio on: 01/21/2015 03:59 PM   Modules accepted: Orders

## 2015-02-18 ENCOUNTER — Ambulatory Visit (INDEPENDENT_AMBULATORY_CARE_PROVIDER_SITE_OTHER): Payer: Medicare Other | Admitting: Internal Medicine

## 2015-02-18 ENCOUNTER — Encounter: Payer: Self-pay | Admitting: Internal Medicine

## 2015-02-18 VITALS — BP 135/70 | HR 69 | Temp 98.8°F | Resp 14 | Ht 64.0 in | Wt 169.2 lb

## 2015-02-18 DIAGNOSIS — E1165 Type 2 diabetes mellitus with hyperglycemia: Secondary | ICD-10-CM | POA: Diagnosis not present

## 2015-02-18 DIAGNOSIS — J449 Chronic obstructive pulmonary disease, unspecified: Secondary | ICD-10-CM

## 2015-02-18 DIAGNOSIS — Z716 Tobacco abuse counseling: Secondary | ICD-10-CM | POA: Diagnosis not present

## 2015-02-18 DIAGNOSIS — J984 Other disorders of lung: Secondary | ICD-10-CM | POA: Diagnosis not present

## 2015-02-18 DIAGNOSIS — IMO0002 Reserved for concepts with insufficient information to code with codable children: Secondary | ICD-10-CM

## 2015-02-18 DIAGNOSIS — Z113 Encounter for screening for infections with a predominantly sexual mode of transmission: Secondary | ICD-10-CM

## 2015-02-18 LAB — HM DIABETES FOOT EXAM: HM Diabetic Foot Exam: NORMAL

## 2015-02-18 NOTE — Patient Instructions (Addendum)
Congratulations on quitting smoking!!!!   The low carb protein shakes that are available are:  Premier Protein shake  Muscle Milk Atkins Bars Adkins shakes  EAS AdvantEdge Carb Control  Breyers' carb Control ice cream on a stick (and the Fudgsicle)  Go for  A walk 1) after dinner or 2) early morning     Diabetes and Exercise Exercising regularly is important. It is not just about losing weight. It has many health benefits, such as:  Improving your overall fitness, flexibility, and endurance.  Increasing your bone density.  Helping with weight control.  Decreasing your body fat.  Increasing your muscle strength.  Reducing stress and tension.  Improving your overall health. People with diabetes who exercise gain additional benefits because exercise:  Reduces appetite.  Improves the body's use of blood sugar (glucose).  Helps lower or control blood glucose.  Decreases blood pressure.  Helps control blood lipids (such as cholesterol and triglycerides).  Improves the body's use of the hormone insulin by:  Increasing the body's insulin sensitivity.  Reducing the body's insulin needs.  Decreases the risk for heart disease because exercising:  Lowers cholesterol and triglycerides levels.  Increases the levels of good cholesterol (such as high-density lipoproteins [HDL]) in the body.  Lowers blood glucose levels. YOUR ACTIVITY PLAN  Choose an activity that you enjoy and set realistic goals. Your health care provider or diabetes educator can help you make an activity plan that works for you. Exercise regularly as directed by your health care provider. This includes:  Performing resistance training twice a week such as push-ups, sit-ups, lifting weights, or using resistance bands.  Performing 150 minutes of cardio exercises each week such as walking, running, or playing sports.  Staying active and spending no more than 90 minutes at one time being inactive. Even  short bursts of exercise are good for you. Three 10-minute sessions spread throughout the day are just as beneficial as a single 30-minute session. Some exercise ideas include:  Taking the dog for a walk.  Taking the stairs instead of the elevator.  Dancing to your favorite song.  Doing an exercise video.  Doing your favorite exercise with a friend. RECOMMENDATIONS FOR EXERCISING WITH TYPE 1 OR TYPE 2 DIABETES   Check your blood glucose before exercising. If blood glucose levels are greater than 240 mg/dL, check for urine ketones. Do not exercise if ketones are present.  Avoid injecting insulin into areas of the body that are going to be exercised. For example, avoid injecting insulin into:  The arms when playing tennis.  The legs when jogging.  Keep a record of:  Food intake before and after you exercise.  Expected peak times of insulin action.  Blood glucose levels before and after you exercise.  The type and amount of exercise you have done.  Review your records with your health care provider. Your health care provider will help you to develop guidelines for adjusting food intake and insulin amounts before and after exercising.  If you take insulin or oral hypoglycemic agents, watch for signs and symptoms of hypoglycemia. They include:  Dizziness.  Shaking.  Sweating.  Chills.  Confusion.  Drink plenty of water while you exercise to prevent dehydration or heat stroke. Body water is lost during exercise and must be replaced.  Talk to your health care provider before starting an exercise program to make sure it is safe for you. Remember, almost any type of activity is better than none. Document Released: 09/12/2003 Document Revised:  11/06/2013 Document Reviewed: 11/29/2012 ExitCare Patient Information 2015 [place], [place]. This information is not intended to replace advice given to you by your health care provider. Make sure you discuss any questions you have with  your health care provider.

## 2015-02-18 NOTE — Progress Notes (Signed)
Subjective:  Patient ID: Maria Hinton, female    DOB: 1945-10-11  Age: 69 y.o. MRN: [id number]  CC: The primary encounter diagnosis was Tobacco abuse counseling. Diagnoses of COPD type B, Pulmonary disease, DM (diabetes mellitus), type 2, uncontrolled, and Screen for STD (sexually transmitted disease) were also pertinent to this visit.  HPI [name] presents for follow up on DM type 2,  insulin controlled.  And COPD   Due to inadequate insurance coverage of medications,  She has not been taking Levemir,  Januvia or Advair since July because she is now in the "doughnut hole"  And will be until the end of the year.   Insurance is [employer] through the state.   She has modified her diet and brings a log of her BS with her,  Has been checking them fasting and postprandially, a total of twice daily.   BS have been 60% under125 fasting and 30% under 150 post prandially,  tprandailly once she has avoided starches   She Quit smoking  In June after a prlonged bronchitis episode.  Still lives with 3 other smokers,  So has to retreat to her room after dinner to avoid the temptation.     Outpatient Prescriptions Prior to Visit  Medication Sig Dispense Refill  . albuterol (PROVENTIL HFA;VENTOLIN HFA) 108 (90 BASE) MCG/ACT inhaler Inhale 2 puffs into the lungs every 6 (six) hours as needed for wheezing. 6.7 g 11  . ALPRAZolam (XANAX) 0.25 MG tablet TAKE 1 TABLET BY MOUTH AS NEEDED FOR SLEEP 30 tablet 5  . atenolol (TENORMIN) 50 MG tablet Take 1 tablet (50 mg total) by mouth 2 (two) times daily. 180 tablet 1  . atorvastatin (LIPITOR) 40 MG tablet Take 1 tablet (40 mg total) by mouth daily at 6 PM. 90 tablet 1  . Blood Glucose Monitoring Suppl (ONE TOUCH ULTRA SYSTEM KIT) W/DEVICE KIT Pt needs OneTouch Ultra Blue glucometer dx 250.02 1 each 0  . diazepam (VALIUM) 10 MG tablet Take 1 tablet (10 mg total) by mouth every 12 (twelve) hours as needed (muscle spasm). 30 tablet 1  . glipiZIDE  (GLUCOTROL) 10 MG tablet TAKE 1 TABLET (10 MG TOTAL) BY MOUTH 2 (TWO) TIMES DAILY BEFORE A MEAL. 60 tablet 5  . glucose blood (ONE TOUCH ULTRA TEST) test strip Use as instructed bid, has OneTouch Ultra Blue  dx: 250.02 100 each 3  . hydrochlorothiazide (HYDRODIURIL) 25 MG tablet Take 1 tablet (25 mg total) by mouth daily. 90 tablet 2  . Lancets (ONETOUCH ULTRASOFT) lancets Use as instructed bid dx 250.02 100 each 3  . lisinopril (PRINIVIL,ZESTRIL) 5 MG tablet Take 1 tablet (5 mg total) by mouth daily. 90 tablet 3  . meloxicam (MOBIC) 7.5 MG tablet TAKE 1 TABLET BY MOUTH EVERY DAY 30 tablet 5  . metFORMIN (GLUCOPHAGE) 1000 MG tablet Take 1 tablet (1,000 mg total) by mouth 2 (two) times daily with a meal. 180 tablet 3  . omeprazole (PRILOSEC) 20 MG capsule Take 1 capsule (20 mg total) by mouth daily. 90 capsule 1  . tiotropium (SPIRIVA) 18 MCG inhalation capsule Place 1 capsule (18 mcg total) into inhaler and inhale daily. 30 capsule 12  . erythromycin ophthalmic ointment Place 1 application into the left eye 3 (three) times daily. 3.5 g 0  . Fluticasone-Salmeterol (ADVAIR) 500-50 MCG/DOSE AEPB Inhale 1 puff into the lungs 2 (two) times daily. (Patient not taking: Reported on 02/18/2015) 60 each 2  . Insulin Detemir (LEVEMIR) 100  UNIT/ML Pen Inject 10-13 Units into the skin 2 (two) times daily. 10 units in the morning, 13 units at night (Patient not taking: Reported on 02/18/2015) 15 mL 5  . sitaGLIPtin (JANUVIA) 100 MG tablet Take 1 tablet (100 mg total) by mouth daily. (Patient not taking: Reported on 02/18/2015) 30 tablet 2   No facility-administered medications prior to visit.    Review of Systems;  Patient denies headache, fevers, malaise, unintentional weight loss, skin rash, eye pain, sinus congestion and sinus pain, sore throat, dysphagia,  hemoptysis , cough, dyspnea, wheezing, chest pain, palpitations, orthopnea, edema, abdominal pain, nausea, melena, diarrhea, constipation, flank pain,  dysuria, hematuria, urinary  Frequency, nocturia, numbness, tingling, seizures,  Focal weakness, Loss of consciousness,  Tremor, insomnia, depression, anxiety, and suicidal ideation.      Objective:  BP 135/70 mmHg  Pulse 69  Temp(Src) 98.8 F (37.1 C) (Oral)  Resp 14  Ht 5' 4"  (1.626 m)  Wt 169 lb 4 oz (76.771 kg)  BMI 29.04 kg/m2  SpO2 96%  BP Readings from Last 3 Encounters:  02/18/15 135/70  01/03/15 118/70  12/18/14 122/70    Wt Readings from Last 3 Encounters:  02/18/15 169 lb 4 oz (76.771 kg)  01/03/15 170 lb 8 oz (77.338 kg)  12/18/14 173 lb (78.472 kg)    General appearance: alert, cooperative and appears stated age Ears: normal TM's and external ear canals both ears Throat: lips, mucosa, and tongue normal; teeth and gums normal Neck: no adenopathy, no carotid bruit, supple, symmetrical, trachea midline and thyroid not enlarged, symmetric, no tenderness/mass/nodules Back: symmetric, no curvature. ROM normal. No CVA tenderness. Lungs: clear to auscultation bilaterally Heart: regular rate and rhythm, S1, S2 normal, no murmur, click, rub or gallop Abdomen: soft, non-tender; bowel sounds normal; no masses,  no organomegaly Pulses: 2+ and symmetric Skin: Skin color, texture, turgor normal. No rashes or lesions Lymph nodes: Cervical, supraclavicular, and axillary nodes normal. Foot exam:  Nails are well trimmed,  No callouses,  Sensation intact to microfilament except over right bunionectomy.    Lab Results  Component Value Date   HGBA1C 9.2* 01/03/2015   HGBA1C 7.7* 09/17/2014   HGBA1C 7.0* 05/18/2014    Lab Results  Component Value Date   CREATININE 1.13 01/16/2015   CREATININE 1.06 01/03/2015   CREATININE 1.04 09/17/2014    Lab Results  Component Value Date   WBC 8.2 11/28/2013   HGB 12.9 11/28/2013   HCT 38.2 11/28/2013   PLT 253.0 11/28/2013   GLUCOSE 170* 01/16/2015   CHOL 142 01/16/2015   TRIG 156.0* 01/16/2015   HDL 41.00 01/16/2015    LDLDIRECT 74.0 01/16/2015   LDLCALC 70 01/16/2015   ALT 27 01/03/2015   AST 21 01/03/2015   NA 141 01/16/2015   K 3.6 01/16/2015   CL 104 01/16/2015   CREATININE 1.13 01/16/2015   BUN 16 01/16/2015   CO2 29 01/16/2015   TSH 1.31 11/28/2013   INR 0.9 11/21/2012   HGBA1C 9.2* 01/03/2015   MICROALBUR 2.7* 01/03/2015    Mr L Spine Ltd W/o Cm  10/31/2014   CLINICAL DATA:  Low back pain into the right groin.  EXAM: MRI LUMBAR SPINE WITHOUT CONTRAST  TECHNIQUE: Multiplanar, multisequence MR imaging of the lumbar spine was performed. No intravenous contrast was administered.  COMPARISON:  08/20/2011  FINDINGS: No marrow signal abnormality suggestive of fracture, infection, or neoplasm. T1 and T2 hyperintense structure in the L4 right pedicle and T12 body are consistent with benign and  incidental hemangioma. Normal conus signal and morphology. No perispinal abnormality to explain back pain.  Degenerative changes:  T12- L1: Disc narrowing.  No herniation or stenosis.  L1-L2: Disc disc narrowing.  No herniation or stenosis.  L2-L3: Disc bulging and mild degenerative facet and ligament overgrowth. There is mild narrowing of the lateral recesses which is stable. No nerve compression.  L3-L4: Circumferential disc bulging with superimposed central protrusion. Degenerative ligamentous and facet overgrowth. There is moderate spinal canal stenosis with near complete effacement of the subarachnoid space. Disc and facet spur distorts the bilateral foramen, without neural compression or progression.  L4-L5: Circumferential disc bulging with central disc protrusion that migrates inferiorly. A previously seen right paracentral disc extrusion has been resected or regressed (a right laminotomy was present on the previous study as well). There is right subarticular recess stenosis from residual disc bulge and posterior element overgrowth. Bulging disc and facet overgrowth results in progressive right foraminal stenosis with  near complete effacement of perineural fat.  L5-S1:Degenerative disc narrowing with circumferential bulging and a small right paracentral disc herniation. There is facet overgrowth, right greater than left. There is mild bilateral foraminal narrowing without nerve compression. Stable mild narrowing of the right subarticular recess without compression.  IMPRESSION: 1. L4-5 disc extrusion is resolved but residual disc bulge and facet overgrowth still narrows the right subarticular recess. 2. L4-5 moderate right foraminal stenosis which is progressed from 2013. 3. L3-4 borderline advanced spinal canal stenosis, stable from 2013. 4. Noncompressive degenerative changes are noted above.   Electronically Signed   By: Monte Fantasia M.D.   On: 10/31/2014 15:53    Assessment & Plan:   Problem List Items Addressed This Visit      Unprioritized   DM (diabetes mellitus), type 2, uncontrolled    Uncontrolled by last hemoglobin A1c, on maximal doses of glipizide and metformin, now  with proteinuria .  Patient is up-to-date on eye exams and foot exam is normal today. .she has made dietary modifications  But cannot afford Januvia currently.  Diet reviewed.  BS reviewed and suggest that next A1c will be closer to 8.0.   Continue glipizide to 10 mg bid, metformin,  Lisinopril, lipitor, and  daily baby aspirin.  Advised to add a 15 to 30 minute walk daily to improve insulin sensitivity, .. Lab Results  Component Value Date   HGBA1C 9.2* 01/03/2015    Lab Results  Component Value Date   MICROALBUR 2.7* 01/03/2015   Lab Results  Component Value Date   CHOL 142 01/16/2015   HDL 41.00 01/16/2015   LDLCALC 70 01/16/2015   LDLDIRECT 74.0 01/16/2015   TRIG 156.0* 01/16/2015   CHOLHDL 3 01/16/2015                Relevant Orders   Comprehensive metabolic panel   Hemoglobin A1c   LDL cholesterol, direct   Lipid panel   Tobacco abuse counseling - Primary    Smoking cessation instruction/counseling  given:  commended patient for quitting and reviewed strategies for preventing relapses      Pulmonary disease    She has not had the chest CT that Dr Stevenson Clinch ordered to evaluate the right apical thickening. She has deferred imaging due to cost.       COPD type B    She has limited access to medications including her LABA until the end of the year due to medication insurance lapse.  Advised to use GoodRx to find least expensive source, and ask  for samples from Pulmonology.  Recommended reviewing insurance plan choices  During annual renewal.        Other Visit Diagnoses    Screen for STD (sexually transmitted disease)        Relevant Orders    Hepatitis C antibody      A total of 25 minutes of face to face time was spent with patient more than half of which was spent in counselling about the above mentioned conditions  and coordination of care .  I have discontinued Ms. Whedbee's Fluticasone-Salmeterol, erythromycin, sitaGLIPtin, and Insulin Detemir. I am also having her maintain her glucose blood, ONE TOUCH ULTRA SYSTEM KIT, onetouch ultrasoft, hydrochlorothiazide, atenolol, atorvastatin, metFORMIN, omeprazole, tiotropium, meloxicam, glipiZIDE, albuterol, diazepam, ALPRAZolam, and lisinopril.  No orders of the defined types were placed in this encounter.    Medications Discontinued During This Encounter  Medication Reason  . erythromycin ophthalmic ointment Completed Course  . sitaGLIPtin (JANUVIA) 100 MG tablet   . Insulin Detemir (LEVEMIR) 100 UNIT/ML Pen   . Fluticasone-Salmeterol (ADVAIR) 500-50 MCG/DOSE AEPB     Follow-up: No Follow-up on file.   Crecencio Mc, MD

## 2015-02-18 NOTE — Progress Notes (Signed)
Pre-visit discussion using our clinic review tool. No additional management support is needed unless otherwise documented below in the visit note.  

## 2015-02-19 NOTE — Assessment & Plan Note (Signed)
Uncontrolled by last hemoglobin A1c, on maximal doses of glipizide and metformin, now  with proteinuria .  Patient is up-to-date on eye exams and foot exam is normal today. .she has made dietary modifications  But cannot afford Januvia currently.  Diet reviewed.  BS reviewed and suggest that next A1c will be closer to 8.0.   Continue glipizide to 10 mg bid, metformin,  Lisinopril, lipitor, and  daily baby aspirin.  Advised to add a 15 to 30 minute walk daily to improve insulin sensitivity, .. Lab Results  Component Value Date   HGBA1C 9.2* 01/03/2015    Lab Results  Component Value Date   MICROALBUR 2.7* 01/03/2015   Lab Results  Component Value Date   CHOL 142 01/16/2015   HDL 41.00 01/16/2015   LDLCALC 70 01/16/2015   LDLDIRECT 74.0 01/16/2015   TRIG 156.0* 01/16/2015   CHOLHDL 3 01/16/2015

## 2015-02-19 NOTE — Assessment & Plan Note (Signed)
Smoking cessation instruction/counseling given:  commended patient for quitting and reviewed strategies for preventing relapses 

## 2015-02-19 NOTE — Assessment & Plan Note (Signed)
She has limited access to medications including her LABA until the end of the year due to medication insurance lapse.  Advised to use GoodRx to find least expensive source, and ask for samples from Pulmonology.  Recommended reviewing insurance plan choices  During annual renewal.

## 2015-02-19 NOTE — Assessment & Plan Note (Signed)
She has not had the chest CT that Dr Stevenson Clinch ordered to evaluate the right apical thickening. She has deferred imaging due to cost.

## 2015-03-07 ENCOUNTER — Other Ambulatory Visit: Payer: Self-pay | Admitting: Nurse Practitioner

## 2015-03-25 ENCOUNTER — Encounter: Payer: Self-pay | Admitting: Internal Medicine

## 2015-03-25 ENCOUNTER — Ambulatory Visit (INDEPENDENT_AMBULATORY_CARE_PROVIDER_SITE_OTHER): Payer: Medicare Other | Admitting: Internal Medicine

## 2015-03-25 VITALS — BP 130/74 | HR 73 | Ht 64.0 in | Wt 167.0 lb

## 2015-03-25 DIAGNOSIS — J449 Chronic obstructive pulmonary disease, unspecified: Secondary | ICD-10-CM | POA: Diagnosis not present

## 2015-03-25 DIAGNOSIS — J441 Chronic obstructive pulmonary disease with (acute) exacerbation: Secondary | ICD-10-CM

## 2015-03-25 MED ORDER — PREDNISONE 20 MG PO TABS: 20.0000 mg | ORAL_TABLET | Freq: Every day | ORAL | Status: DC

## 2015-03-25 MED ORDER — FLUTICASONE-SALMETEROL 500-50 MCG/DOSE IN AEPB: 1.0000 | INHALATION_SPRAY | Freq: Two times a day (BID) | RESPIRATORY_TRACT | Status: DC

## 2015-03-25 MED ORDER — AZITHROMYCIN 250 MG PO TABS: ORAL_TABLET | ORAL | Status: DC

## 2015-03-25 NOTE — Assessment & Plan Note (Signed)
Patient with history of COPD B, Moderate airflow limitation (GOLD STAGE I) today AECOP, I believe that chronic exposure to secondhand smoke is causing cope exacerbations At this time given a prescription for prednisone 20 mg, 1 tab daily 5 days and zpak  Plan Cont with Advair 500/50 to the patient's current COPD regiment. She is advised to take 1 puff twice daily, and gargle and rinse after each use. Patient assistance paperwork also given since patient is in insurance lapse Continue with Spiriva. Prednisone 20 mg daily 5 days and zapk  Avoid any secondhand smoke exposure as much as possible

## 2015-03-25 NOTE — Patient Instructions (Addendum)
Follow up with Dr. Stevenson Clinch in 3 months -Prednisone 20 mg: 1 tab daily with breakfast for 5 days. -Zpak - used as directed -Advair 500/50-1 puff in the morning and 1 puff in the evening: gargle and rinse after each use -Continue with Spiriva -Avoid secondhand smoke as much as possible -Continue diet and exercise -patient assistance paperwork for Advair - given to the patient

## 2015-03-25 NOTE — Progress Notes (Signed)
[facility] Consultation      MRN# [id number] Maria Hinton 1946/03/20   CC: Chief Complaint  Patient presents with  . Follow-up    breathing better until Friday; chest tightness; prod cough w/yellow mucus; SOB w/activity      Brief History: Initial visit on 02/21/2014 Patient is a pleasant 69 year old female presenting in the clinic today for reestablish care and management of her asthma. History is per the patient.  Patient states that she was diagnosed with asthma in 1995 she had a bad bout of bronchitis, during that time she was placed on prednisone for her bronchitis where she continued to have cough and wheezing. She stated she saw a pulmonologist at that time and was told based on a PFT and symptoms that she had asthma she was managed briefly with Advair for a few months and albuterol rescue inhaler; she stated after about 4 months she was weaned off the Advair and has only been on rescue inhaler. She states that currently she has occasional wheezing and sometimes shortness of breath with exertion. He states that she can walk a mile before having any major dyspnea; off note she had hip replacement done last year and has not been able to walk like she used to. In the last year she has had no ER visits, no urgent care visits, no use of prednisone, no intubation, no antibiotics for asthma. She has never been intubated for an asthma exacerbation. She states that she will use her rescue inhaler maybe 1-2 times a 6 month period. Her triggers are wood filings and saw dust for asthma. She previously smoked one pack per day for 36 years, in between that she quit for about 13 years, but resumed smoking 5 years ago when her husband passed away; currently she smokes 5-6 cigarettes per day. Patient admits she has 2 dogs at home that stay inside with her. She admits to morning symptoms of smoker's cough (described as clear thick sputum production), mild wheeze, and mild shortness of  breath.  Patient stated that when she was about 69 years old she had a surgery done a left chest wall for abnormal veins, stated she had 3 ribs taken out at that time. -At this visit and further workup a pulmonary function testing, repeat CAT scan of chest, and followup.   [name] 04/05/15  Patient presents today for followup of her CT chest and pulmonary function testing results. She states that she's currently smoking 1.5 packs per day, which is an increase since her last visit. She states that she's had some recent stressors, grandson recent surgery for a brain tumor, her mother was recently ill. Patient states that she still has some mild to moderate shortness of breath and dyspnea on exertion, but has not been using albuterol (she does not feel that she needs). Patient still endorses a morning cough with mild thick whitish sputum. Today her CAT score is 10, and her mMRC is 1 Plan - tobacco cessation, start spiriva.     [name] 12/18/14 Patient presents today for an acute care visit for acute sick visit. Patient stated that in the middle of May she went to the beach shortly after which she had a upper history tract infection where she was seen at a local urgent care had a chest x-ray done that showed scar tissue, her oxygen level during that visit was noted to be at 81%, she was given nebulizers 2 which gradually improved her oxygen level. Also after that urgent  care visit she was given doxycycline and cough pills for her symptoms. Since her last visit she's also quit smoking, but she is exposed significantly from secondhand smoke from other roommates. She's currently on Spiriva which was doing fairly well for her. She states that she's now having cough in the morning and congestion, she states that her recovery from May has been very slow and she is still fatigued and still has moderate amount of cough. Patient states that she's also using albuterol 3-4 times per day.  Plan Given her recent  symptoms, I believe that step up therapy is needed at this time, will add Advair 500/50 to the patient's current COPD regiment. She is advised to take 1 puff twice daily, and gargle and rinse after each use. Continue with Spiriva. Prednisone 20 mg daily 5 days. Avoid any secondhand smoke exposure as much as possible   Events since last clinic visit: Patient presents today for follow-up visit of her COPD. Patient states since Friday she has had increased cough, sputum production (thick white to mild yellowish), shortness of breath, runny eyes and watery nose. She states that her family members who live with her actively smoking in the house and is triggering her COPD. She is also stated that she is in the "donut hole" for her insurance and is having a difficult time obtaining medications. She does have Advair currently that will last her about another 4 weeks. Today, she states prior to her recent COPD acting up she was doing fairly well, has only use albuterol once on Saturday.      Medication:   Current Outpatient Rx  Name  Route  Sig  Dispense  Refill  . albuterol (PROVENTIL HFA;VENTOLIN HFA) 108 (90 BASE) MCG/ACT inhaler   Inhalation   Inhale 2 puffs into the lungs every 6 (six) hours as needed for wheezing.   6.7 g   11   . ALPRAZolam (XANAX) 0.25 MG tablet      TAKE 1 TABLET BY MOUTH AS NEEDED FOR SLEEP   30 tablet   5     Not to exceed 5 additional fills before 02/26/2015   . atenolol (TENORMIN) 50 MG tablet   Oral   Take 1 tablet (50 mg total) by mouth 2 (two) times daily.   180 tablet   1   . atorvastatin (LIPITOR) 40 MG tablet   Oral   Take 1 tablet (40 mg total) by mouth daily at 6 PM.   90 tablet   1   . Blood Glucose Monitoring Suppl (ONE TOUCH ULTRA SYSTEM KIT) W/DEVICE KIT      Pt needs OneTouch Ultra Blue glucometer dx 250.02   1 each   0   . diazepam (VALIUM) 10 MG tablet   Oral   Take 1 tablet (10 mg total) by mouth every 12 (twelve) hours as  needed (muscle spasm).   30 tablet   1   . glipiZIDE (GLUCOTROL) 10 MG tablet      TAKE 1 TABLET (10 MG TOTAL) BY MOUTH 2 (TWO) TIMES DAILY BEFORE A MEAL.   60 tablet   5   . glucose blood (ONE TOUCH ULTRA TEST) test strip      Use as instructed bid, has OneTouch Ultra Blue  dx: 250.02   100 each   3   . hydrochlorothiazide (HYDRODIURIL) 12.5 MG tablet      TAKE 1 TABLET (12.5 MG TOTAL) BY MOUTH DAILY.   90 tablet   1   .  Lancets (ONETOUCH ULTRASOFT) lancets      Use as instructed bid dx 250.02   100 each   3   . lisinopril (PRINIVIL,ZESTRIL) 5 MG tablet   Oral   Take 1 tablet (5 mg total) by mouth daily.   90 tablet   3   . meloxicam (MOBIC) 7.5 MG tablet      TAKE 1 TABLET BY MOUTH EVERY DAY   30 tablet   5     As needed for joint pain   . metFORMIN (GLUCOPHAGE) 1000 MG tablet   Oral   Take 1 tablet (1,000 mg total) by mouth 2 (two) times daily with a meal.   180 tablet   3   . omeprazole (PRILOSEC) 20 MG capsule   Oral   Take 1 capsule (20 mg total) by mouth daily.   90 capsule   1   . tiotropium (SPIRIVA) 18 MCG inhalation capsule   Inhalation   Place 1 capsule (18 mcg total) into inhaler and inhale daily.   30 capsule   12   . azithromycin (ZITHROMAX) 250 MG tablet      Take 2 tablets Day 1 and then 1 tablet daily till gone   6 each   0   . Fluticasone-Salmeterol (ADVAIR DISKUS) 500-50 MCG/DOSE AEPB   Inhalation   Inhale 1 puff into the lungs 2 (two) times daily.   28 each   0   . Fluticasone-Salmeterol (ADVAIR DISKUS) 500-50 MCG/DOSE AEPB   Inhalation   Inhale 1 puff into the lungs 2 (two) times daily.   60 each   0   . predniSONE (DELTASONE) 20 MG tablet   Oral   Take 1 tablet (20 mg total) by mouth daily with breakfast.   5 tablet   0      Review of Systems  Constitutional: Negative for fever and chills.  HENT: Negative for hearing loss.   Eyes: Negative.  Negative for pain.  Respiratory: Positive for cough, sputum  production, shortness of breath and wheezing.   Cardiovascular: Negative for chest pain and leg swelling.  Gastrointestinal: Negative for heartburn, vomiting and abdominal pain.  Genitourinary: Negative.   Musculoskeletal: Negative for myalgias and neck pain.  Skin: Negative for rash.  Neurological: Negative.  Negative for headaches.  Endo/Heme/Allergies: Negative.   Psychiatric/Behavioral: Negative.       Allergies:  Vicodin  Physical Examination:  VS: BP 130/74 mmHg  Pulse 73  Ht 5' 4"  (1.626 m)  Wt 167 lb (75.751 kg)  BMI 28.65 kg/m2  SpO2 96%  General Appearance: No distress  HEENT: PERRLA, no ptosis, no other lesions noticed Pulmonary:upper airway coarse sounds, mild dec basilar BS, no wheezing.  Cardiovascular:  Normal S1,S2.  No m/r/g.     Abdomen:Exam: Benign, Soft, non-tender, No masses  Skin:   warm, no rashes, no ecchymosis  Extremities: normal, no cyanosis, clubbing, warm with normal capillary refill.       Assessment and Plan:69 yo F with COPD Stage B, seen for follow up visit, now with AECOPD COPD exacerbation AECOPD Second AECOPD in past 2-3 months Patient has quit smoking since May 2016, but family members continue to smoke around her and irritate her respiratory status.  Plan -zpak -prednisone 106m x 5 days -avoid secondhand smoke as much as possible.   COPD type B Patient with history of COPD B, Moderate airflow limitation (GOLD STAGE I) today AECOP, I believe that chronic exposure to secondhand smoke is causing cope exacerbations At  this time given a prescription for prednisone 20 mg, 1 tab daily 5 days and zpak  Plan Cont with Advair 500/50 to the patient's current COPD regiment. She is advised to take 1 puff twice daily, and gargle and rinse after each use. Patient assistance paperwork also given since patient is in insurance lapse Continue with Spiriva. Prednisone 20 mg daily 5 days and zapk  Avoid any secondhand smoke exposure as much as  possible        Updated Medication List Outpatient Encounter Prescriptions as of 03/25/2015  Medication Sig  . albuterol (PROVENTIL HFA;VENTOLIN HFA) 108 (90 BASE) MCG/ACT inhaler Inhale 2 puffs into the lungs every 6 (six) hours as needed for wheezing.  Marland Kitchen ALPRAZolam (XANAX) 0.25 MG tablet TAKE 1 TABLET BY MOUTH AS NEEDED FOR SLEEP  . atenolol (TENORMIN) 50 MG tablet Take 1 tablet (50 mg total) by mouth 2 (two) times daily.  Marland Kitchen atorvastatin (LIPITOR) 40 MG tablet Take 1 tablet (40 mg total) by mouth daily at 6 PM.  . Blood Glucose Monitoring Suppl (ONE TOUCH ULTRA SYSTEM KIT) W/DEVICE KIT Pt needs OneTouch Ultra Blue glucometer dx 250.02  . diazepam (VALIUM) 10 MG tablet Take 1 tablet (10 mg total) by mouth every 12 (twelve) hours as needed (muscle spasm).  Marland Kitchen glipiZIDE (GLUCOTROL) 10 MG tablet TAKE 1 TABLET (10 MG TOTAL) BY MOUTH 2 (TWO) TIMES DAILY BEFORE A MEAL.  Marland Kitchen glucose blood (ONE TOUCH ULTRA TEST) test strip Use as instructed bid, has OneTouch Ultra Blue  dx: 250.02  . hydrochlorothiazide (HYDRODIURIL) 12.5 MG tablet TAKE 1 TABLET (12.5 MG TOTAL) BY MOUTH DAILY.  Marland Kitchen Lancets (ONETOUCH ULTRASOFT) lancets Use as instructed bid dx 250.02  . lisinopril (PRINIVIL,ZESTRIL) 5 MG tablet Take 1 tablet (5 mg total) by mouth daily.  . meloxicam (MOBIC) 7.5 MG tablet TAKE 1 TABLET BY MOUTH EVERY DAY  . metFORMIN (GLUCOPHAGE) 1000 MG tablet Take 1 tablet (1,000 mg total) by mouth 2 (two) times daily with a meal.  . omeprazole (PRILOSEC) 20 MG capsule Take 1 capsule (20 mg total) by mouth daily.  Marland Kitchen tiotropium (SPIRIVA) 18 MCG inhalation capsule Place 1 capsule (18 mcg total) into inhaler and inhale daily.  Marland Kitchen azithromycin (ZITHROMAX) 250 MG tablet Take 2 tablets Day 1 and then 1 tablet daily till gone  . predniSONE (DELTASONE) 20 MG tablet Take 1 tablet (20 mg total) by mouth daily with breakfast.  . [DISCONTINUED] hydrochlorothiazide (HYDRODIURIL) 25 MG tablet Take 1 tablet (25 mg total) by mouth  daily.   No facility-administered encounter medications on file as of 03/25/2015.    Orders for this visit: No orders of the defined types were placed in this encounter.    Thank  you for the visitation and for allowing  [facility] to assist in the care of your patient. Our recommendations are noted above.  Please contact us if we can be of further service.  Vilinda Boehringer, MD [facility] Office Number: [phone]

## 2015-03-25 NOTE — Assessment & Plan Note (Signed)
AECOPD Second AECOPD in past 2-3 months Patient has quit smoking since May 2016, but family members continue to smoke around her and irritate her respiratory status.  Plan -zpak -prednisone 20mg  x 5 days -avoid secondhand smoke as much as possible.

## 2015-04-03 ENCOUNTER — Other Ambulatory Visit: Payer: Self-pay | Admitting: Internal Medicine

## 2015-04-09 ENCOUNTER — Other Ambulatory Visit (INDEPENDENT_AMBULATORY_CARE_PROVIDER_SITE_OTHER): Payer: Medicare Other

## 2015-04-09 DIAGNOSIS — Z113 Encounter for screening for infections with a predominantly sexual mode of transmission: Secondary | ICD-10-CM

## 2015-04-09 DIAGNOSIS — E1165 Type 2 diabetes mellitus with hyperglycemia: Secondary | ICD-10-CM

## 2015-04-09 DIAGNOSIS — IMO0002 Reserved for concepts with insufficient information to code with codable children: Secondary | ICD-10-CM

## 2015-04-09 LAB — COMPREHENSIVE METABOLIC PANEL
ALT: 20 U/L (ref 0–35)
AST: 17 U/L (ref 0–37)
Albumin: 4 g/dL (ref 3.5–5.2)
Alkaline Phosphatase: 92 U/L (ref 39–117)
BUN: 12 mg/dL (ref 6–23)
CO2: 25 mEq/L (ref 19–32)
Calcium: 9.6 mg/dL (ref 8.4–10.5)
Chloride: 102 mEq/L (ref 96–112)
Creatinine, Ser: 0.95 mg/dL (ref 0.40–1.20)
GFR: 62.33 mL/min (ref >60.00)
Glucose, Bld: 178 mg/dL — ABNORMAL HIGH (ref 70–99)
Potassium: 4.4 mEq/L (ref 3.5–5.1)
Sodium: 141 mEq/L (ref 135–145)
Total Bilirubin: 0.3 mg/dL (ref 0.2–1.2)
Total Protein: 6.7 g/dL (ref 6.0–8.3)

## 2015-04-09 LAB — LIPID PANEL
Cholesterol: 134 mg/dL (ref 0–200)
HDL: 35.5 mg/dL — ABNORMAL LOW (ref >39.00)
Total CHOL/HDL Ratio: 4
Triglycerides: 420 mg/dL — ABNORMAL HIGH (ref 0.0–149.0)

## 2015-04-09 LAB — LDL CHOLESTEROL, DIRECT: Direct LDL: 51 mg/dL

## 2015-04-09 LAB — HEMOGLOBIN A1C: Hgb A1c MFr Bld: 7.4 % — ABNORMAL HIGH (ref 4.6–6.5)

## 2015-04-09 LAB — HEPATITIS C ANTIBODY: HCV Ab: NEGATIVE

## 2015-04-12 ENCOUNTER — Encounter: Payer: Self-pay | Admitting: Internal Medicine

## 2015-04-12 ENCOUNTER — Ambulatory Visit (INDEPENDENT_AMBULATORY_CARE_PROVIDER_SITE_OTHER): Payer: Medicare Other | Admitting: Internal Medicine

## 2015-04-12 VITALS — BP 118/78 | HR 72 | Temp 98.4°F | Resp 12 | Ht 64.0 in | Wt 167.4 lb

## 2015-04-12 DIAGNOSIS — E785 Hyperlipidemia, unspecified: Secondary | ICD-10-CM

## 2015-04-12 DIAGNOSIS — Z23 Encounter for immunization: Secondary | ICD-10-CM

## 2015-04-12 DIAGNOSIS — J441 Chronic obstructive pulmonary disease with (acute) exacerbation: Secondary | ICD-10-CM | POA: Diagnosis not present

## 2015-04-12 DIAGNOSIS — I10 Essential (primary) hypertension: Secondary | ICD-10-CM | POA: Diagnosis not present

## 2015-04-12 NOTE — Progress Notes (Signed)
Pre-visit discussion using our clinic review tool. No additional management support is needed unless otherwise documented below in the visit note.  

## 2015-04-12 NOTE — Patient Instructions (Addendum)
Your diabetes is now under excellent control good control but your triglycerides have increased and are > 300.  I would like to repeat them in 3 months with your next A1c,  If they are still > 300,  I will recommend starting fenofibrate to lower them.  Remember that excessive carbohydrates (including excessive alcohol),  Too many starches,  and lack of regular exercise are all associated with elevated triglycerides, so modifying any of these factors will help.   When your evening BS is < 150,  Check a  3 am sugar to see if you are < 80.  If that happens,  We need to reduce evening glipizide dose to 5 mg if Atkins meal is being eaten (or any evening meal < 25 carbs)    Try the Heritage Flakes cereal available at [facility] on the bottom shelf, with unsweetened vanilla flavored almond milk  [employer] sells an organic almond mik, non refrigerated,  Called :"orgain"  That is fortified with protein and very low carb   You do not need insulin right now.  Go for a walk after dinner .  This will lower your blood sugars!!   We will repeat fasting labs on or after January 4,

## 2015-04-12 NOTE — Progress Notes (Signed)
Subjective:  Patient ID: Maria Hinton, female    DOB: 1945/12/14  Age: 69 y.o. MRN: [id number]  CC: The primary encounter diagnosis was Encounter for immunization. Diagnoses of HLD (hyperlipidemia), HTN (hypertension), benign, and COPD exacerbation ([place]) were also pertinent to this visit.  HPI Maria Hinton presents for 3 month follow up on Type 2 DM, recent diagnosis,  Hypertension, hyperlipidemia and obesity.   She has lost 2 lbs since last visit.  Has gone to 4 week diabetic seminar years ago.     TREATED FOR COPD EXACERBATION WITH PREDNISOBE 20 MG DAILY X 5 ON sEPT 19TH BY PULMONARY  Before the prednisone was prescribed her fasting BS have been 150 to 219 .Marland Kitchen Her 2.5 hr post  Prandial sugars after dinner have been 150 to 290 .  Has been eating Atkins s dinner,   Taking only glipizide or metformin for the past 3 months While on the prednisone the fastings were better (lower) and teh evening sugars were much much higher   Lab Results  Component Value Date   HGBA1C 7.4* 04/09/2015     Outpatient Prescriptions Prior to Visit  Medication Sig Dispense Refill  . albuterol (PROVENTIL HFA;VENTOLIN HFA) 108 (90 BASE) MCG/ACT inhaler Inhale 2 puffs into the lungs every 6 (six) hours as needed for wheezing. 6.7 g 11  . ALPRAZolam (XANAX) 0.25 MG tablet TAKE 1 TABLET BY MOUTH AS NEEDED FOR SLEEP 30 tablet 5  . atenolol (TENORMIN) 50 MG tablet Take 1 tablet (50 mg total) by mouth 2 (two) times daily. 180 tablet 1  . atorvastatin (LIPITOR) 40 MG tablet TAKE 1 TABLET BY MOUTH DAILY AT 6 PM. 90 tablet 1  . azithromycin (ZITHROMAX) 250 MG tablet Take 2 tablets Day 1 and then 1 tablet daily till gone 6 each 0  . Blood Glucose Monitoring Suppl (ONE TOUCH ULTRA SYSTEM KIT) W/DEVICE KIT Pt needs OneTouch Ultra Blue glucometer dx 250.02 1 each 0  . diazepam (VALIUM) 10 MG tablet Take 1 tablet (10 mg total) by mouth every 12 (twelve) hours as needed (muscle spasm). 30 tablet 1  . Fluticasone-Salmeterol  (ADVAIR DISKUS) 500-50 MCG/DOSE AEPB Inhale 1 puff into the lungs 2 (two) times daily. 60 each 0  . glipiZIDE (GLUCOTROL) 10 MG tablet TAKE 1 TABLET (10 MG TOTAL) BY MOUTH 2 (TWO) TIMES DAILY BEFORE A MEAL. 60 tablet 5  . glucose blood (ONE TOUCH ULTRA TEST) test strip Use as instructed bid, has OneTouch Ultra Blue  dx: 250.02 100 each 3  . hydrochlorothiazide (HYDRODIURIL) 12.5 MG tablet TAKE 1 TABLET (12.5 MG TOTAL) BY MOUTH DAILY. 90 tablet 1  . Lancets (ONETOUCH ULTRASOFT) lancets Use as instructed bid dx 250.02 100 each 3  . lisinopril (PRINIVIL,ZESTRIL) 5 MG tablet Take 1 tablet (5 mg total) by mouth daily. 90 tablet 3  . meloxicam (MOBIC) 7.5 MG tablet TAKE 1 TABLET BY MOUTH EVERY DAY 30 tablet 5  . metFORMIN (GLUCOPHAGE) 1000 MG tablet Take 1 tablet (1,000 mg total) by mouth 2 (two) times daily with a meal. 180 tablet 3  . omeprazole (PRILOSEC) 20 MG capsule TAKE ONE CAPSULE BY MOUTH EVERY DAY 90 capsule 1  . tiotropium (SPIRIVA) 18 MCG inhalation capsule Place 1 capsule (18 mcg total) into inhaler and inhale daily. 30 capsule 12  . predniSONE (DELTASONE) 20 MG tablet Take 1 tablet (20 mg total) by mouth daily with breakfast. 5 tablet 0   No facility-administered medications prior to visit.    Review  of Systems;  Patient denies headache, fevers, malaise, unintentional weight loss, skin rash, eye pain, sinus congestion and sinus pain, sore throat, dysphagia,  hemoptysis , cough, dyspnea, wheezing, chest pain, palpitations, orthopnea, edema, abdominal pain, nausea, melena, diarrhea, constipation, flank pain, dysuria, hematuria, urinary  Frequency, nocturia, numbness, tingling, seizures,  Focal weakness, Loss of consciousness,  Tremor, insomnia, depression, anxiety, and suicidal ideation.      Objective:  BP 118/78 mmHg  Pulse 72  Temp(Src) 98.4 F (36.9 C) (Oral)  Resp 12  Ht 5' 4"  (1.626 m)  Wt 167 lb 6 oz (75.921 kg)  BMI 28.72 kg/m2  SpO2 97%  BP Readings from Last 3  Encounters:  04/12/15 118/78  03/25/15 130/74  02/18/15 135/70    Wt Readings from Last 3 Encounters:  04/12/15 167 lb 6 oz (75.921 kg)  03/25/15 167 lb (75.751 kg)  02/18/15 169 lb 4 oz (76.771 kg)    General appearance: alert, cooperative and appears stated age Ears: normal TM's and external ear canals both ears Throat: lips, mucosa, and tongue normal; teeth and gums normal Neck: no adenopathy, no carotid bruit, supple, symmetrical, trachea midline and thyroid not enlarged, symmetric, no tenderness/mass/nodules Back: symmetric, no curvature. ROM normal. No CVA tenderness. Lungs: clear to auscultation bilaterally Heart: regular rate and rhythm, S1, S2 normal, no murmur, click, rub or gallop Abdomen: soft, non-tender; bowel sounds normal; no masses,  no organomegaly Pulses: 2+ and symmetric Skin: Skin color, texture, turgor normal. No rashes or lesions Lymph nodes: Cervical, supraclavicular, and axillary nodes normal.  Lab Results  Component Value Date   HGBA1C 7.4* 04/09/2015   HGBA1C 9.2* 01/03/2015   HGBA1C 7.7* 09/17/2014    Lab Results  Component Value Date   CREATININE 0.95 04/09/2015   CREATININE 1.13 01/16/2015   CREATININE 1.06 01/03/2015    Lab Results  Component Value Date   WBC 8.2 11/28/2013   HGB 12.9 11/28/2013   HCT 38.2 11/28/2013   PLT 253.0 11/28/2013   GLUCOSE 178* 04/09/2015   CHOL 134 04/09/2015   TRIG * 04/09/2015    420.0 Triglyceride is over 400; calculations on Lipids are invalid.   HDL 35.50* 04/09/2015   LDLDIRECT 51.0 04/09/2015   LDLCALC 70 01/16/2015   ALT 20 04/09/2015   AST 17 04/09/2015   NA 141 04/09/2015   K 4.4 04/09/2015   CL 102 04/09/2015   CREATININE 0.95 04/09/2015   BUN 12 04/09/2015   CO2 25 04/09/2015   TSH 1.31 11/28/2013   INR 0.9 11/21/2012   HGBA1C 7.4* 04/09/2015   MICROALBUR 2.7* 01/03/2015    Mr L [facility]ine [facility] W/o Cm  10/31/2014   CLINICAL DATA:  Low back pain into the right groin.  EXAM: MRI LUMBAR  SPINE WITHOUT CONTRAST  TECHNIQUE: Multiplanar, multisequence MR imaging of the lumbar spine was performed. No intravenous contrast was administered.  COMPARISON:  08/20/2011  FINDINGS: No marrow signal abnormality suggestive of fracture, infection, or neoplasm. T1 and T2 hyperintense structure in the L4 right pedicle and T12 body are consistent with benign and incidental hemangioma. Normal conus signal and morphology. No perispinal abnormality to explain back pain.  Degenerative changes:  T12- L1: Disc narrowing.  No herniation or stenosis.  L1-L2: Disc disc narrowing.  No herniation or stenosis.  L2-L3: Disc bulging and mild degenerative facet and ligament overgrowth. There is mild narrowing of the lateral recesses which is stable. No nerve compression.  L3-L4: Circumferential disc bulging with superimposed central protrusion. Degenerative ligamentous and facet  overgrowth. There is moderate spinal canal stenosis with near complete effacement of the subarachnoid space. Disc and facet spur distorts the bilateral foramen, without neural compression or progression.  L4-L5: Circumferential disc bulging with central disc protrusion that migrates inferiorly. A previously seen right paracentral disc extrusion has been resected or regressed (a right laminotomy was present on the previous study as well). There is right subarticular recess stenosis from residual disc bulge and posterior element overgrowth. Bulging disc and facet overgrowth results in progressive right foraminal stenosis with near complete effacement of perineural fat.  L5-S1:Degenerative disc narrowing with circumferential bulging and a small right paracentral disc herniation. There is facet overgrowth, right greater than left. There is mild bilateral foraminal narrowing without nerve compression. Stable mild narrowing of the right subarticular recess without compression.  IMPRESSION: 1. L4-5 disc extrusion is resolved but residual disc bulge and facet  overgrowth still narrows the right subarticular recess. 2. L4-5 moderate right foraminal stenosis which is progressed from 2013. 3. L3-4 borderline advanced spinal canal stenosis, stable from 2013. 4. Noncompressive degenerative changes are noted above.   Electronically Signed   By: Monte Fantasia M.D.   On: 10/31/2014 15:53    Assessment & Plan:   Problem List Items Addressed This Visit    HTN (hypertension), benign    Well controlled on current regimen. Renal function stable, no changes today.  Lab Results  Component Value Date   CREATININE 0.95 04/09/2015   Lab Results  Component Value Date   NA 141 04/09/2015   K 4.4 04/09/2015   CL 102 04/09/2015   CO2 25 04/09/2015         HLD (hyperlipidemia)    Well controlled on current statin therapy except for triglcyeries which should improve with diet and exercise. .   Liver enzymes are normal , no changes today.  Lab Results  Component Value Date   CHOL 134 04/09/2015   HDL 35.50* 04/09/2015   LDLCALC 70 01/16/2015   LDLDIRECT 51.0 04/09/2015   TRIG * 04/09/2015    420.0 Triglyceride is over 400; calculations on Lipids are invalid.   CHOLHDL 4 04/09/2015    Lab Results  Component Value Date   ALT 20 04/09/2015   AST 17 04/09/2015   ALKPHOS 92 04/09/2015   BILITOT 0.3 04/09/2015             COPD exacerbation ([place])    Now resolved after prednisone couse prescribed by Pulmonary       Other Visit Diagnoses    Encounter for immunization    -  Primary      A total of 25 minutes of face to face time was spent with patient more than half of which was spent in counselling about the above mentioned conditions  and coordination of care  I have discontinued Ms. Wickliff's predniSONE. I am also having her maintain her glucose blood, ONE TOUCH ULTRA SYSTEM KIT, onetouch ultrasoft, atenolol, metFORMIN, tiotropium, meloxicam, glipiZIDE, albuterol, diazepam, ALPRAZolam, lisinopril, hydrochlorothiazide, azithromycin,  Fluticasone-Salmeterol, omeprazole, and atorvastatin.  No orders of the defined types were placed in this encounter.    Medications Discontinued During This Encounter  Medication Reason  . predniSONE (DELTASONE) 20 MG tablet Completed Course    Follow-up: Return in about 3 months (around 07/13/2015).   Crecencio Mc, MD

## 2015-04-14 ENCOUNTER — Encounter: Payer: Self-pay | Admitting: Internal Medicine

## 2015-04-14 NOTE — Assessment & Plan Note (Signed)
Improving control with low GI diet and exercise .  hemoglobin A1c is DOWN TO 7.3 . No indication ot resume insulin. .Patient is referred for  eye exam and foot exam was done today.  There is  no proteinuria on prior micro urinalysis .  Fasting lipids were reviewed and hypertriglyceredmia discussed.  Lipids will be repeated  and FENOFIBRATE /statin therapy advised if indicated by application of new ACC guidelines based on patient's 10 year risk of CAD

## 2015-04-14 NOTE — Assessment & Plan Note (Signed)
Well controlled on current regimen. Renal function stable, no changes today.  Lab Results  Component Value Date   CREATININE 0.95 04/09/2015   Lab Results  Component Value Date   NA 141 04/09/2015   K 4.4 04/09/2015   CL 102 04/09/2015   CO2 25 04/09/2015

## 2015-04-14 NOTE — Assessment & Plan Note (Signed)
Well controlled on current statin therapy except for triglcyeries which should improve with diet and exercise. .   Liver enzymes are normal , no changes today.  Lab Results  Component Value Date   CHOL 134 04/09/2015   HDL 35.50* 04/09/2015   LDLCALC 70 01/16/2015   LDLDIRECT 51.0 04/09/2015   TRIG * 04/09/2015    420.0 Triglyceride is over 400; calculations on Lipids are invalid.   CHOLHDL 4 04/09/2015    Lab Results  Component Value Date   ALT 20 04/09/2015   AST 17 04/09/2015   ALKPHOS 92 04/09/2015   BILITOT 0.3 04/09/2015

## 2015-04-14 NOTE — Assessment & Plan Note (Signed)
Now resolved after prednisone couse prescribed by Pulmonary

## 2015-04-29 ENCOUNTER — Telehealth: Payer: Self-pay | Admitting: Internal Medicine

## 2015-05-07 ENCOUNTER — Telehealth: Payer: Self-pay | Admitting: Internal Medicine

## 2015-05-08 ENCOUNTER — Telehealth: Payer: Self-pay | Admitting: *Deleted

## 2015-05-08 NOTE — Telephone Encounter (Signed)
Called [facility] and re-faxed faxed medical clearance X 3.

## 2015-05-08 NOTE — Telephone Encounter (Signed)
[facility] clinic has requested the medical clearance for patient who has COPD. Patient is schedule for a colonoscopy 11/07.

## 2015-05-13 ENCOUNTER — Ambulatory Visit
Admission: RE | Admit: 2015-05-13 | Discharge: 2015-05-13 | Disposition: A | Discharge status: Home / Self Care | Payer: Medicare Other | Source: Ambulatory Visit | Attending: Gastroenterology | Admitting: Gastroenterology

## 2015-05-13 ENCOUNTER — Ambulatory Visit: Payer: Medicare Other | Admitting: Anesthesiology

## 2015-05-13 ENCOUNTER — Encounter
Admission: RE | Disposition: A | Discharge status: Home / Self Care | Payer: Self-pay | Source: Ambulatory Visit | Attending: Gastroenterology

## 2015-05-13 DIAGNOSIS — I10 Essential (primary) hypertension: Secondary | ICD-10-CM | POA: Diagnosis not present

## 2015-05-13 DIAGNOSIS — K219 Gastro-esophageal reflux disease without esophagitis: Secondary | ICD-10-CM | POA: Diagnosis not present

## 2015-05-13 DIAGNOSIS — E119 Type 2 diabetes mellitus without complications: Secondary | ICD-10-CM | POA: Diagnosis not present

## 2015-05-13 DIAGNOSIS — G8929 Other chronic pain: Secondary | ICD-10-CM | POA: Diagnosis not present

## 2015-05-13 DIAGNOSIS — Z7951 Long term (current) use of inhaled steroids: Secondary | ICD-10-CM | POA: Insufficient documentation

## 2015-05-13 DIAGNOSIS — Z801 Family history of malignant neoplasm of trachea, bronchus and lung: Secondary | ICD-10-CM | POA: Insufficient documentation

## 2015-05-13 DIAGNOSIS — Z833 Family history of diabetes mellitus: Secondary | ICD-10-CM | POA: Insufficient documentation

## 2015-05-13 DIAGNOSIS — Z87891 Personal history of nicotine dependence: Secondary | ICD-10-CM | POA: Insufficient documentation

## 2015-05-13 DIAGNOSIS — Z85820 Personal history of malignant melanoma of skin: Secondary | ICD-10-CM | POA: Insufficient documentation

## 2015-05-13 DIAGNOSIS — Z96642 Presence of left artificial hip joint: Secondary | ICD-10-CM | POA: Diagnosis not present

## 2015-05-13 DIAGNOSIS — M549 Dorsalgia, unspecified: Secondary | ICD-10-CM | POA: Insufficient documentation

## 2015-05-13 DIAGNOSIS — Z7984 Long term (current) use of oral hypoglycemic drugs: Secondary | ICD-10-CM | POA: Insufficient documentation

## 2015-05-13 DIAGNOSIS — M2540 Effusion, unspecified joint: Secondary | ICD-10-CM | POA: Diagnosis not present

## 2015-05-13 DIAGNOSIS — Z79899 Other long term (current) drug therapy: Secondary | ICD-10-CM | POA: Diagnosis not present

## 2015-05-13 DIAGNOSIS — Z825 Family history of asthma and other chronic lower respiratory diseases: Secondary | ICD-10-CM | POA: Insufficient documentation

## 2015-05-13 DIAGNOSIS — M1990 Unspecified osteoarthritis, unspecified site: Secondary | ICD-10-CM | POA: Diagnosis not present

## 2015-05-13 DIAGNOSIS — J45909 Unspecified asthma, uncomplicated: Secondary | ICD-10-CM | POA: Insufficient documentation

## 2015-05-13 DIAGNOSIS — Z885 Allergy status to narcotic agent status: Secondary | ICD-10-CM | POA: Insufficient documentation

## 2015-05-13 DIAGNOSIS — Z8249 Family history of ischemic heart disease and other diseases of the circulatory system: Secondary | ICD-10-CM | POA: Diagnosis not present

## 2015-05-13 DIAGNOSIS — Z90710 Acquired absence of both cervix and uterus: Secondary | ICD-10-CM | POA: Diagnosis not present

## 2015-05-13 DIAGNOSIS — F329 Major depressive disorder, single episode, unspecified: Secondary | ICD-10-CM | POA: Diagnosis not present

## 2015-05-13 DIAGNOSIS — E785 Hyperlipidemia, unspecified: Secondary | ICD-10-CM | POA: Insufficient documentation

## 2015-05-13 DIAGNOSIS — M25549 Pain in joints of unspecified hand: Secondary | ICD-10-CM | POA: Diagnosis not present

## 2015-05-13 DIAGNOSIS — Z86010 Personal history of colonic polyps: Secondary | ICD-10-CM | POA: Insufficient documentation

## 2015-05-13 HISTORY — DX: Chronic obstructive pulmonary disease, unspecified: J44.9

## 2015-05-13 HISTORY — PX: COLONOSCOPY: SHX5424

## 2015-05-13 LAB — GLUCOSE, CAPILLARY: Glucose-Capillary: 205 mg/dL — ABNORMAL HIGH (ref 65–99)

## 2015-05-13 LAB — HM COLONOSCOPY: HM Colonoscopy: NORMAL

## 2015-05-13 SURGERY — COLONOSCOPY
Anesthesia: General

## 2015-05-13 MED ORDER — FENTANYL CITRATE (PF) 100 MCG/2ML IJ SOLN
INTRAMUSCULAR | Status: DC | PRN
  Administered 2015-05-13: 50 ug via INTRAVENOUS

## 2015-05-13 MED ORDER — MIDAZOLAM HCL 2 MG/2ML IJ SOLN
INTRAMUSCULAR | Status: DC | PRN
Start: 2015-05-13 — End: 2015-05-13
  Administered 2015-05-13: 1 mg via INTRAVENOUS

## 2015-05-13 MED ORDER — PROPOFOL 500 MG/50ML IV EMUL
INTRAVENOUS | Status: DC | PRN
  Administered 2015-05-13: 130 ug/kg/min via INTRAVENOUS

## 2015-05-13 MED ORDER — PROPOFOL 10 MG/ML IV BOLUS
INTRAVENOUS | Status: DC | PRN
  Administered 2015-05-13: 50 mg via INTRAVENOUS
  Administered 2015-05-13: 30 mg via INTRAVENOUS

## 2015-05-13 MED ORDER — SODIUM CHLORIDE 0.9 % IV SOLN: INTRAVENOUS | Status: DC

## 2015-05-13 MED ORDER — SODIUM CHLORIDE 0.9 % IV SOLN
INTRAVENOUS | Status: DC
Start: 2015-05-13 — End: 2015-05-13
  Administered 2015-05-13: 10:00:00 via INTRAVENOUS
  Administered 2015-05-13: 1000 mL via INTRAVENOUS

## 2015-05-13 NOTE — Anesthesia Procedure Notes (Signed)
Date/Time: 05/13/2015 10:25 AM Performed by: Nelda Marseille Pre-anesthesia Checklist: Patient identified, Emergency Drugs available, Suction available, Patient being monitored and Timeout performed Oxygen Delivery Method: Nasal cannula

## 2015-05-13 NOTE — Transfer of Care (Signed)
Immediate Anesthesia Transfer of Care Note  Patient: Maria Hinton  Procedure(s) Performed: Procedure(s): COLONOSCOPY (N/A)  Patient Location: PACU  Anesthesia Type:General  Level of Consciousness: sedated  Airway & Oxygen Therapy: Patient Spontanous Breathing and Patient connected to nasal cannula oxygen  Post-op Assessment: Report given to RN and Post -op Vital signs reviewed and stable  Post vital signs: Reviewed and stable  Last Vitals:  Filed Vitals:   05/13/15 0925  BP: 140/71  Pulse: 67  Temp: 36.6 C  Resp: 20    Complications: No apparent anesthesia complications

## 2015-05-13 NOTE — Anesthesia Postprocedure Evaluation (Signed)
  Anesthesia Post-op Note  Patient: Maria Hinton  Procedure(s) Performed: Procedure(s): COLONOSCOPY (N/A)  Anesthesia type:General  Patient location: PACU  Post pain: Pain level controlled  Post assessment: Post-op Vital signs reviewed, Patient's Cardiovascular Status Stable, Respiratory Function Stable, Patent Airway and No signs of Nausea or vomiting  Post vital signs: Reviewed and stable  Last Vitals:  Filed Vitals:   05/13/15 1110  BP: 123/58  Pulse: 63  Temp:   Resp: 19    Level of consciousness: awake, alert  and patient cooperative  Complications: No apparent anesthesia complications

## 2015-05-13 NOTE — H&P (Signed)
Primary Care Physician:  Crecencio Mc, MD Primary Gastroenterologist:  Dr. Candace Cruise  Pre-Procedure History & Physical: HPI:  Maria Hinton is a 69 y.o. female is here for an colonoscopy.   Past Medical History  Diagnosis Date  . PONV (postoperative nausea and vomiting)   . Hypertension     takes Metoprolol/HCTZ daily  . Hyperlipidemia     takes Pravastatin daily  . Asthma   . Pneumonia     x 2 ;in Dec 2010/2011  . Arthritis   . Joint swelling   . Joint pain     fingers  . Chronic back pain     DDD,herniated disc/spondylosis/radiculopathy  . GERD (gastroesophageal reflux disease)     takes Omeprazole daily  . Hemorrhoids   . History of colonic polyps     colonoscopy 04/2012 - Dr Candace Cruise  . Diabetes mellitus   . Early cataracts, bilateral     Dr. Ruthine Dose - [facility]  . Depression     after death of husband;takes Ativan prn    Past Surgical History  Procedure Laterality Date  . Thoracic disc surgery  at age 79 and 42     mass of veins that had gotten into muscle and wrapped around rib;3 ribs also removed from left side  . Abdominal hysterectomy      at age 67  . Back surgery  20+yrs ago  . Foot surgery  2011    bunionectomy and knot removed from bottom of both feet  . Melanoma excision  2011    on nose/back on neck  . Vagina surgery      vaginal wall ruptured   . Tonsillectomy  as a child    and adenoids   . Colonoscopy    . Esophagogastroduodenoscopy  2011  . Tubal ligation    . Lumbar laminectomy/decompression microdiscectomy  09/10/2011    Procedure: LUMBAR LAMINECTOMY/DECOMPRESSION MICRODISCECTOMY 1 LEVEL;  Surgeon: Hosie Spangle, MD;  Location: [place] [facility];  Service: Neurosurgery;  Laterality: Right;  RIGHT Lumbar  Laminotomy and microdiskectomy Lumbar Four-Five  . Total hip arthroplasty Left 2014    Dr. Mack Guise  . Joint replacement Left 2014    hip  Kraskinski    Prior to Admission medications   Medication Sig Start Date End Date  Taking? Authorizing Provider  albuterol (PROVENTIL HFA;VENTOLIN HFA) 108 (90 BASE) MCG/ACT inhaler Inhale 2 puffs into the lungs every 6 (six) hours as needed for wheezing. 12/18/14   Vishal Mungal, MD  ALPRAZolam (XANAX) 0.25 MG tablet TAKE 1 TABLET BY MOUTH AS NEEDED FOR SLEEP 01/03/15   Crecencio Mc, MD  atenolol (TENORMIN) 50 MG tablet Take 1 tablet (50 mg total) by mouth 2 (two) times daily. 10/09/14   Crecencio Mc, MD  atorvastatin (LIPITOR) 40 MG tablet TAKE 1 TABLET BY MOUTH DAILY AT 6 PM. 04/03/15   Crecencio Mc, MD  azithromycin (ZITHROMAX) 250 MG tablet Take 2 tablets Day 1 and then 1 tablet daily till gone 03/25/15   Vishal Mungal, MD  Blood Glucose Monitoring Suppl (ONE TOUCH ULTRA SYSTEM KIT) W/DEVICE KIT Pt needs OneTouch Ultra Blue glucometer dx 250.02 05/26/13   Raquel Dagoberto Ligas, NP  diazepam (VALIUM) 10 MG tablet Take 1 tablet (10 mg total) by mouth every 12 (twelve) hours as needed (muscle spasm). 01/03/15   Crecencio Mc, MD  Fluticasone-Salmeterol (ADVAIR DISKUS) 500-50 MCG/DOSE AEPB Inhale 1 puff into the lungs 2 (two) times daily. 03/25/15  Vishal Mungal, MD  glipiZIDE (GLUCOTROL) 10 MG tablet TAKE 1 TABLET (10 MG TOTAL) BY MOUTH 2 (TWO) TIMES DAILY BEFORE A MEAL. 12/10/14   Crecencio Mc, MD  glucose blood (ONE TOUCH ULTRA TEST) test strip Use as instructed bid, has OneTouch Ultra Blue  dx: 250.02 05/26/13   Raquel M Rey, NP  hydrochlorothiazide (HYDRODIURIL) 12.5 MG tablet TAKE 1 TABLET (12.5 MG TOTAL) BY MOUTH DAILY. 03/07/15   Crecencio Mc, MD  Lancets [facility] ULTRASOFT) lancets Use as instructed bid dx 250.02 05/26/13   Raquel M Rey, NP  lisinopril (PRINIVIL,ZESTRIL) 5 MG tablet Take 1 tablet (5 mg total) by mouth daily. 01/06/15   Crecencio Mc, MD  meloxicam (MOBIC) 7.5 MG tablet TAKE 1 TABLET BY MOUTH EVERY DAY 11/13/14   Crecencio Mc, MD  metFORMIN (GLUCOPHAGE) 1000 MG tablet Take 1 tablet (1,000 mg total) by mouth 2 (two) times daily with a meal. 10/09/14   Crecencio Mc,  MD  omeprazole (PRILOSEC) 20 MG capsule TAKE ONE CAPSULE BY MOUTH EVERY DAY 04/03/15   Crecencio Mc, MD  tiotropium (SPIRIVA) 18 MCG inhalation capsule Place 1 capsule (18 mcg total) into inhaler and inhale daily. 10/09/14   Crecencio Mc, MD    Allergies as of 04/11/2015 - Review Complete 03/25/2015  Allergen Reaction Noted  . Vicodin [hydrocodone-acetaminophen] Other (See Comments) 09/08/2011    Family History  Problem Relation Age of Onset  . Anesthesia problems Neg Hx   . Hypotension Neg Hx   . Malignant hyperthermia Neg Hx   . Pseudochol deficiency Neg Hx   . Heart disease Brother     valve replacement  . COPD Brother   . COPD Mother     Emphysema  . Diabetes Mother   . Heart disease Father   . Seizures Father   . Cancer Father     Lung cancer with brain metastasis  . Asthma Brother   . COPD Son 28    Social History   Social History  . Marital Status: Widowed    Spouse Name: N/A  . Number of Children: N/A  . Years of Education: N/A   Occupational History  . Not on file.   Social History Main Topics  . Smoking status: Former Smoker -- 1.50 packs/day for 20 years    Types: Cigarettes  . Smokeless tobacco: Never Used     Comment: Pt quit 11/08/2014.Pt had quit smoking for 13 years before starting back in 2010  . Alcohol Use: No  . Drug Use: No  . Sexual Activity: No   Other Topics Concern  . Not on file   Social History Narrative    Review of Systems: See HPI, otherwise negative ROS  Physical Exam: There were no vitals taken for this visit. General:   Alert,  pleasant and cooperative in NAD Head:  Normocephalic and atraumatic. Neck:  Supple; no masses or thyromegaly. Lungs:  Clear throughout to auscultation.    Heart:  Regular rate and rhythm. Abdomen:  Soft, nontender and nondistended. Normal bowel sounds, without guarding, and without rebound.   Neurologic:  Alert and  oriented x4;  grossly normal neurologically.  Impression/Plan: [name] is here for an colonoscopy to be performed for personal hx of colon polyps  Risks, benefits, limitations, and alternatives regarding colonoscopy have been reviewed with the patient.  Questions have been answered.  All parties agreeable.   , Lupita Dawn, MD  05/13/2015, 9:07 AM

## 2015-05-13 NOTE — Op Note (Signed)
[facility] Patient Name: Maria Hinton Procedure Date: 05/13/2015 10:10 AM MRN: [id number] Account #: [id number] Date of Birth: 1945-11-17 Admit Type: Outpatient Age: 69 Room: [facility] ENDO ROOM 4 Gender: Female Note Status: Finalized Procedure:         Colonoscopy Indications:       Personal history of colonic polyps Providers:         Lupita Dawn. Candace Cruise, MD Referring MD:      Deborra Medina, MD (Referring MD) Medicines:         Monitored Anesthesia Care Complications:     No immediate complications. Procedure:         Pre-Anesthesia Assessment:                    - Prior to the procedure, a History and Physical was                     performed, and patient medications, allergies and                     sensitivities were reviewed. The patient's tolerance of                     previous anesthesia was reviewed.                    - The risks and benefits of the procedure and the sedation                     options and risks were discussed with the patient. All                     questions were answered and informed consent was obtained.                    - After reviewing the risks and benefits, the patient was                     deemed in satisfactory condition to undergo the procedure.                    After obtaining informed consent, the colonoscope was                     passed under direct vision. Throughout the procedure, the                     patient's blood pressure, pulse, and oxygen saturations                     were monitored continuously. The Colonoscope was                     introduced through the anus and advanced to the the cecum,                     identified by appendiceal orifice and ileocecal valve. The                     colonoscopy was performed without difficulty. The patient                     tolerated the procedure well. The quality of the bowel  preparation was good. Findings:      The colon  (entire examined portion) appeared normal. Impression:        - The entire examined colon is normal.                    - No specimens collected. Recommendation:    - Discharge patient to home.                    - Repeat colonoscopy in 5 years for surveillance.                    - The findings and recommendations were discussed with the                     patient. Procedure Code(s): --- Professional ---                    [phone], Colonoscopy, flexible; diagnostic, including                     collection of specimen(s) by brushing or washing, when                     performed (separate procedure) Diagnosis Code(s): --- Professional ---                    Z86.010, Personal history of colonic polyps CPT copyright 2014 American Medical Association. All rights reserved. The codes documented in this report are preliminary and upon coder review may  be revised to meet current compliance requirements. Hulen Luster, MD 05/13/2015 10:32:28 AM This report has been signed electronically. Number of Addenda: 0 Note Initiated On: 05/13/2015 10:10 AM Scope Withdrawal Time: 0 hours 8 minutes 19 seconds  Total Procedure Duration: 0 hours 15 minutes 34 seconds       [facility]

## 2015-05-13 NOTE — Anesthesia Preprocedure Evaluation (Signed)
Anesthesia Evaluation  Patient identified by MRN, date of birth, ID band Patient awake    Reviewed: Allergy & Precautions, H&P , NPO status , Patient's Chart, lab work & pertinent test results, reviewed documented beta blocker date and time   History of Anesthesia Complications (+) PONV and history of anesthetic complications  Airway Mallampati: II  TM Distance: >3 FB Neck ROM: full    Dental no notable dental hx. (+) Teeth Intact   Pulmonary shortness of breath and with exertion, asthma , neg sleep apnea, neg pneumonia , COPD,  COPD inhaler, neg recent URI, former smoker,    Pulmonary exam normal breath sounds clear to auscultation       Cardiovascular Exercise Tolerance: Good hypertension, On Medications and On Home Beta Blockers (-) angina(-) CAD, (-) Past MI, (-) Cardiac Stents and (-) CABG Normal cardiovascular exam(-) dysrhythmias (-) Valvular Problems/Murmurs Rhythm:regular Rate:Normal     Neuro/Psych PSYCHIATRIC DISORDERS (depression) negative neurological ROS     GI/Hepatic Neg liver ROS, GERD  Medicated and Controlled,  Endo/Other  diabetes, Well Controlled, Oral Hypoglycemic Agents  Renal/GU negative Renal ROS  negative genitourinary   Musculoskeletal   Abdominal   Peds  Hematology negative hematology ROS (+)   Anesthesia Other Findings Past Medical History:   PONV (postoperative nausea and vomiting)                     Hypertension                                                   Comment:takes Metoprolol/HCTZ daily   Hyperlipidemia                                                 Comment:takes Pravastatin daily   Asthma                                                       Pneumonia                                                      Comment:x 2 ;in Dec 2010/2011   Arthritis                                                    Joint swelling                                               Joint pain  Comment:fingers   Chronic back pain                                              Comment:DDD,herniated disc/spondylosis/radiculopathy   GERD (gastroesophageal reflux disease)                         Comment:takes Omeprazole daily   Hemorrhoids                                                  History of colonic polyps                                      Comment:colonoscopy 04/2012 - Dr Candace Cruise   Diabetes mellitus                                            Early cataracts, bilateral                                     Comment:Dr. Rebech - [facility]   Depression                                                     Comment:after death of husband;takes Ativan prn   COPD (chronic obstructive pulmonary disease) (*              Reproductive/Obstetrics negative OB ROS                             Anesthesia Physical Anesthesia Plan  ASA: III  Anesthesia Plan: General   Post-op Pain Management:    Induction:   Airway Management Planned:   Additional Equipment:   Intra-op Plan:   Post-operative Plan:   Informed Consent: I have reviewed the patients History and Physical, chart, labs and discussed the procedure including the risks, benefits and alternatives for the proposed anesthesia with the patient or authorized representative who has indicated his/her understanding and acceptance.   Dental Advisory Given  Plan Discussed with: Anesthesiologist, CRNA and Surgeon  Anesthesia Plan Comments:         Anesthesia Quick Evaluation

## 2015-05-14 ENCOUNTER — Encounter: Payer: Self-pay | Admitting: Gastroenterology

## 2015-05-30 ENCOUNTER — Other Ambulatory Visit: Payer: Self-pay | Admitting: Internal Medicine

## 2015-06-01 ENCOUNTER — Other Ambulatory Visit: Payer: Self-pay | Admitting: Internal Medicine

## 2015-06-06 ENCOUNTER — Other Ambulatory Visit: Payer: Self-pay | Admitting: Neurosurgery

## 2015-06-06 ENCOUNTER — Encounter: Payer: Self-pay | Admitting: Internal Medicine

## 2015-06-20 ENCOUNTER — Encounter (HOSPITAL_COMMUNITY): Payer: Self-pay

## 2015-06-20 ENCOUNTER — Encounter (HOSPITAL_COMMUNITY)
Admission: RE | Admit: 2015-06-20 | Discharge: 2015-06-20 | Disposition: A | Discharge status: Home / Self Care | Payer: Medicare Other | Source: Ambulatory Visit | Attending: Neurosurgery | Admitting: Neurosurgery

## 2015-06-20 DIAGNOSIS — Z01818 Encounter for other preprocedural examination: Secondary | ICD-10-CM | POA: Diagnosis present

## 2015-06-20 DIAGNOSIS — E785 Hyperlipidemia, unspecified: Secondary | ICD-10-CM | POA: Diagnosis not present

## 2015-06-20 DIAGNOSIS — E119 Type 2 diabetes mellitus without complications: Secondary | ICD-10-CM | POA: Insufficient documentation

## 2015-06-20 DIAGNOSIS — K219 Gastro-esophageal reflux disease without esophagitis: Secondary | ICD-10-CM | POA: Diagnosis not present

## 2015-06-20 DIAGNOSIS — Z7982 Long term (current) use of aspirin: Secondary | ICD-10-CM | POA: Diagnosis not present

## 2015-06-20 DIAGNOSIS — J449 Chronic obstructive pulmonary disease, unspecified: Secondary | ICD-10-CM | POA: Diagnosis not present

## 2015-06-20 DIAGNOSIS — J45909 Unspecified asthma, uncomplicated: Secondary | ICD-10-CM | POA: Diagnosis not present

## 2015-06-20 DIAGNOSIS — I10 Essential (primary) hypertension: Secondary | ICD-10-CM | POA: Diagnosis not present

## 2015-06-20 DIAGNOSIS — Z01812 Encounter for preprocedural laboratory examination: Secondary | ICD-10-CM | POA: Insufficient documentation

## 2015-06-20 DIAGNOSIS — Z7984 Long term (current) use of oral hypoglycemic drugs: Secondary | ICD-10-CM | POA: Diagnosis not present

## 2015-06-20 DIAGNOSIS — Z79899 Other long term (current) drug therapy: Secondary | ICD-10-CM | POA: Insufficient documentation

## 2015-06-20 DIAGNOSIS — M4806 Spinal stenosis, lumbar region: Secondary | ICD-10-CM | POA: Insufficient documentation

## 2015-06-20 DIAGNOSIS — Z87891 Personal history of nicotine dependence: Secondary | ICD-10-CM | POA: Diagnosis not present

## 2015-06-20 HISTORY — DX: Insomnia, unspecified: G47.00

## 2015-06-20 LAB — CBC
HCT: 41.8 % (ref 36.0–46.0)
Hemoglobin: 14.1 g/dL (ref 12.0–15.0)
MCH: 31.4 pg (ref 26.0–34.0)
MCHC: 33.7 g/dL (ref 30.0–36.0)
MCV: 93.1 fL (ref 78.0–100.0)
Platelets: 294 10*3/uL (ref 150–400)
RBC: 4.49 MIL/uL (ref 3.87–5.11)
RDW: 12.7 % (ref 11.5–15.5)
WBC: 11.7 10*3/uL — ABNORMAL HIGH (ref 4.0–10.5)

## 2015-06-20 LAB — BASIC METABOLIC PANEL
Anion gap: 12 (ref 5–15)
BUN: 20 mg/dL (ref 6–20)
CO2: 24 mmol/L (ref 22–32)
Calcium: 9.1 mg/dL (ref 8.9–10.3)
Chloride: 102 mmol/L (ref 101–111)
Creatinine, Ser: 1.1 mg/dL — ABNORMAL HIGH (ref 0.44–1.00)
GFR calc Af Amer: 59 mL/min — ABNORMAL LOW (ref >60)
GFR calc non Af Amer: 51 mL/min — ABNORMAL LOW (ref >60)
Glucose, Bld: 128 mg/dL — ABNORMAL HIGH (ref 65–99)
Potassium: 4.5 mmol/L (ref 3.5–5.1)
Sodium: 138 mmol/L (ref 135–145)

## 2015-06-20 LAB — GLUCOSE, CAPILLARY: Glucose-Capillary: 180 mg/dL — ABNORMAL HIGH (ref 65–99)

## 2015-06-20 LAB — SURGICAL PCR SCREEN
MRSA, PCR: NEGATIVE
Staphylococcus aureus: NEGATIVE

## 2015-06-20 NOTE — Pre-Procedure Instructions (Signed)
Maria Hinton  06/20/2015      [id number] [place] [place] [address] [place] [address] Phone: [phone] Fax: [phone]    Your procedure is scheduled on 06-26-2015    Wednesday    Report to [facility] Admitting at 6:30 A.M.   Call this number if you have problems the morning of surgery:  [phone]   Remember:  Do not eat food or drink liquids after midnight.   Take these medicines the morning of surgery with A SIP OF WATER albuterol inhaler if needed,atenolol (Tenormin),diazepam(Valium) if needed,Advair Diskus,Spiriva inhaler capsule,omeprazole)Prilosec)  How to Manage Your Diabetes Before Surgery   Why is it important to control my blood sugar before and after surgery?   Improving blood sugar levels before and after surgery helps healing and can limit problems.  A way of improving blood sugar control is eating a healthy diet by:  - Eating less sugar and carbohydrates  - Increasing activity/exercise  - Talk with your doctor about reaching your blood sugar goals  High blood sugars (greater than 180 mg/dL) can raise your risk of infections and slow down your recovery so you will need to focus on controlling your diabetes during the weeks before surgery.  Make sure that the doctor who takes care of your diabetes knows about your planned surgery including the date and location.  How do I manage my blood sugars before surgery?   Check your blood sugar at least 4 times a day, 2 days before surgery to make sure that they are not too high or low.   Check your blood sugar the morning of your surgery when you wake up and every 2 hours until you get to the Short-Stay unit.  If your blood sugar is less than 70 mg/dL, you will need to treat for low blood sugar by:  Treat a low blood sugar (less than 70 mg/dL) with 1/2 cup of clear juice (cranberry or apple), 4 glucose tablets,  OR glucose gel.  Recheck blood sugar in 15 minutes after treatment (to make sure it is greater than 70 mg/dL).  If blood sugar is not greater than 70 mg/dL on re-check, call [phone] for further instructions.   Report your blood sugar to the Short-Stay nurse when you get to Short-Stay.  References:  [facility], 2007 "How to Manage your Diabetes Before and After Surgery".  What do I do about my diabetes medications?   Do not take oral diabetes medicines (pills) the morning of surgery.          .                         Do not wear jewelry, make-up or nail polish.  Do not wear lotions, powders, or perfumes.  You may not wear deodorant.  Do not shave 48 hours prior to surgery.    Do not bring valuables to the hospital.  [facility] is not responsible for any belongings or valuables.  Contacts, dentures or bridgework may not be worn into surgery.  Leave your suitcase in the car.  After surgery it may be brought to your room.  For patients admitted to the hospital, discharge time will be determined by your treatment team.  Patients discharged the day of surgery will not be allowed to drive home.    Special instructions:  See Attached  Sheet for instructions on CHG shower  Please read over the following fact sheets that you were given. Pain Booklet and Surgical Site Infection Prevention

## 2015-06-21 LAB — HEMOGLOBIN A1C
Hgb A1c MFr Bld: 7.3 % — ABNORMAL HIGH (ref 4.8–5.6)
Mean Plasma Glucose: 163 mg/dL

## 2015-06-21 NOTE — Progress Notes (Signed)
Anesthesia Chart Review:  Pt is 69 year old female scheduled for L3-4 lumbar laminectomy/ decompression microdiscectomy on 06/26/2015 with Dr. Sherwood Gambler.   PMH includes:  HTN, DM, COPD, asthma, hyperlipidemia, GERD, post-op N/V. Former smoker. BMI 29. S/p lumbar laminectomy 09/10/11.   Medications include: albuterol, ASA, atenolol, lipitor, advair, glipizide, hctz, lisinopril, metformin, prilosec, spiriva.   Preoperative labs reviewed.  Glucose 128, hgbA1c 7.3  EKG 06/20/15: NSR. Low voltage QRS. T wave abnormality, consider anterior ischemia. Appears stable dating back to EKG 09/08/11  Echo 12/11/13:  - Left ventricle: The cavity size was normal. There was mild concentric hypertrophy. Systolic function was normal. The estimated ejection fraction was in the range of 55% to 60%. Wall motion was normal; there were no regional wall motion abnormalities. Doppler parameters are consistent with abnormal left ventricular relaxation (grade 1 diastolic dysfunction). - Tricuspid valve: There was trivial regurgitation. - Pulmonary arteries: Systolic pressure was within the normal range.  Pt tolerated same surgery back in 2013 with similar appearing EKG. Echo in 2015 normal wall motion. If no changes, I anticipate pt can proceed with surgery as scheduled.   Willeen Cass, FNP-BC [facility]/Anesthesiology Phone: [phone] 06/21/2015 1:26 PM

## 2015-06-25 ENCOUNTER — Ambulatory Visit (INDEPENDENT_AMBULATORY_CARE_PROVIDER_SITE_OTHER): Payer: Medicare Other | Admitting: Internal Medicine

## 2015-06-25 ENCOUNTER — Encounter: Payer: Self-pay | Admitting: Internal Medicine

## 2015-06-25 VITALS — BP 138/72 | HR 68 | Ht 64.0 in | Wt 168.8 lb

## 2015-06-25 DIAGNOSIS — J449 Chronic obstructive pulmonary disease, unspecified: Secondary | ICD-10-CM | POA: Diagnosis not present

## 2015-06-25 MED ORDER — CEFAZOLIN SODIUM-DEXTROSE 2-3 GM-% IV SOLR
2.0000 g | INTRAVENOUS | Status: DC
  Filled 2015-06-25: qty 50

## 2015-06-25 NOTE — Patient Instructions (Signed)
Follow-up with Dr. Stevenson Clinch in 3 months - continue with Advair and Spiriva. -gargle and rinse after each use.  -Avoid secondhand smoke as much as possible -Physical therapy, and exercise after surgery -Incentive spirometer, 20-25 times per day after surgery

## 2015-06-25 NOTE — Progress Notes (Signed)
[facility] Consultation      MRN# [id number] Maria Hinton Mar 14, 1946   CC: Chief Complaint  Patient presents with  . Follow-up    pt. states breathing has improved. occ. SOB. denies wheezing, cough or chest pain/tightness.       Brief History: Initial visit on 02/21/2014 Patient is a pleasant 69 year old female presenting in the clinic today for reestablish care and management of her asthma. History is per the patient.  Patient states that she was diagnosed with asthma in 1995 she had a bad bout of bronchitis, during that time she was placed on prednisone for her bronchitis where she continued to have cough and wheezing. She stated she saw a pulmonologist at that time and was told based on a PFT and symptoms that she had asthma she was managed briefly with Advair for a few months and albuterol rescue inhaler; she stated after about 4 months she was weaned off the Advair and has only been on rescue inhaler. She states that currently she has occasional wheezing and sometimes shortness of breath with exertion. He states that she can walk a mile before having any major dyspnea; off note she had hip replacement done last year and has not been able to walk like she used to. In the last year she has had no ER visits, no urgent care visits, no use of prednisone, no intubation, no antibiotics for asthma. She has never been intubated for an asthma exacerbation. She states that she will use her rescue inhaler maybe 1-2 times a 6 month period. Her triggers are wood filings and saw dust for asthma. She previously smoked one pack per day for 36 years, in between that she quit for about 13 years, but resumed smoking 5 years ago when her husband passed away; currently she smokes 5-6 cigarettes per day. Patient admits she has 2 dogs at home that stay inside with her. She admits to morning symptoms of smoker's cough (described as clear thick sputum production), mild wheeze, and mild shortness  of breath.  Patient stated that when she was about 69 years old she had a surgery done a left chest wall for abnormal veins, stated she had 3 ribs taken out at that time. -At this visit and further workup a pulmonary function testing, repeat CAT scan of chest, and followup.   [name] 04/05/15  Patient presents today for followup of her CT chest and pulmonary function testing results. She states that she's currently smoking 1.5 packs per day, which is an increase since her last visit. She states that she's had some recent stressors, grandson recent surgery for a brain tumor, her mother was recently ill. Patient states that she still has some mild to moderate shortness of breath and dyspnea on exertion, but has not been using albuterol (she does not feel that she needs). Patient still endorses a morning cough with mild thick whitish sputum. Today her CAT score is 10, and her mMRC is 1 Plan - tobacco cessation, start spiriva.     [name] 12/18/14 Patient presents today for an acute care visit for acute sick visit. Patient stated that in the middle of May she went to the beach shortly after which she had a upper history tract infection where she was seen at a local urgent care had a chest x-ray done that showed scar tissue, her oxygen level during that visit was noted to be at 81%, she was given nebulizers 2 which gradually improved her oxygen level. Also after  that urgent care visit she was given doxycycline and cough pills for her symptoms. Since her last visit she's also quit smoking, but she is exposed significantly from secondhand smoke from other roommates. She's currently on Spiriva which was doing fairly well for her. She states that she's now having cough in the morning and congestion, she states that her recovery from May has been very slow and she is still fatigued and still has moderate amount of cough. Patient states that she's also using albuterol 3-4 times per day.  Plan Given her recent  symptoms, I believe that step up therapy is needed at this time, will add Advair 500/50 to the patient's current COPD regiment. She is advised to take 1 puff twice daily, and gargle and rinse after each use. Continue with Spiriva. Prednisone 20 mg daily 5 days. Avoid any secondhand smoke exposure as much as possible   [name] 03/22/15 Patient presents today for follow-up visit of her COPD. Patient states since Friday she has had increased cough, sputum production (thick white to mild yellowish), shortness of breath, runny eyes and watery nose. She states that her family members who live with her actively smoking in the house and is triggering her COPD. She is also stated that she is in the "donut hole" for her insurance and is having a difficult time obtaining medications. She does have Advair currently that will last her about another 4 weeks. Today, she states prior to her recent COPD acting up she was doing fairly well, has only use albuterol once on Saturday. Plan - Advair, Z-Pak, prednisone taper, diet and exercise.  Events since last clinic visit: She presents today for follow-up visit of her COPD. Since her last visit she has completely stopped smoking, she is currently on Advair and Spiriva, which is providing good clinical benefit for her. She has lumbar surgery scheduled for tomorrow. She states that her family members, 3, smoke indoors, and this sometimes aggravates her breathing pattern. She denies any cough, worsening shortness of breath, sputum production, fevers.      Medication:   Current Outpatient Rx  Name  Route  Sig  Dispense  Refill  . albuterol (PROVENTIL HFA;VENTOLIN HFA) 108 (90 BASE) MCG/ACT inhaler   Inhalation   Inhale 2 puffs into the lungs every 6 (six) hours as needed for wheezing.   6.7 g   11   . ALPRAZolam (XANAX) 0.25 MG tablet      TAKE 1 TABLET BY MOUTH AS NEEDED FOR SLEEP Patient taking differently: Take 0.25 mg by mouth at bedtime as needed for  sleep. TAKE 1 TABLET BY MOUTH AS NEEDED FOR SLEEP   30 tablet   5     Not to exceed 5 additional fills before 02/26/2015   . aspirin EC 81 MG tablet   Oral   Take 81 mg by mouth daily.         Marland Kitchen atenolol (TENORMIN) 50 MG tablet   Oral   Take 1 tablet (50 mg total) by mouth 2 (two) times daily.   180 tablet   1   . atorvastatin (LIPITOR) 40 MG tablet      TAKE 1 TABLET BY MOUTH DAILY AT 6 PM.   90 tablet   1   . Blood Glucose Monitoring Suppl (ONE TOUCH ULTRA SYSTEM KIT) W/DEVICE KIT      Pt needs OneTouch Ultra Blue glucometer dx 250.02   1 each   0   . diazepam (VALIUM) 10 MG tablet  Oral   Take 1 tablet (10 mg total) by mouth every 12 (twelve) hours as needed (muscle spasm). Patient taking differently: Take 10 mg by mouth every 12 (twelve) hours as needed. For muscle spasms   30 tablet   1   . Fluticasone-Salmeterol (ADVAIR DISKUS) 500-50 MCG/DOSE AEPB   Inhalation   Inhale 1 puff into the lungs 2 (two) times daily. Patient taking differently: Inhale 1 puff into the lungs daily.    60 each   0   . glipiZIDE (GLUCOTROL) 10 MG tablet      TAKE 1 TABLET BY MOUTH 2 TIMES DAILY BEFORE A MEAL.   60 tablet   5   . glucose blood (ONE TOUCH ULTRA TEST) test strip      Use as instructed bid, has OneTouch Ultra Blue  dx: 250.02   100 each   3   . hydrochlorothiazide (HYDRODIURIL) 12.5 MG tablet      TAKE 1 TABLET (12.5 MG TOTAL) BY MOUTH DAILY.   90 tablet   1   . Lancets (ONETOUCH ULTRASOFT) lancets      Use as instructed bid dx 250.02   100 each   3   . lisinopril (PRINIVIL,ZESTRIL) 5 MG tablet   Oral   Take 1 tablet (5 mg total) by mouth daily.   90 tablet   3   . meloxicam (MOBIC) 7.5 MG tablet      TAKE 1 TABLET BY MOUTH EVERY DAY   30 tablet   5     As needed for joint pain   . metFORMIN (GLUCOPHAGE) 1000 MG tablet   Oral   Take 1 tablet (1,000 mg total) by mouth 2 (two) times daily with a meal.   180 tablet   3   . omeprazole  (PRILOSEC) 20 MG capsule      TAKE ONE CAPSULE BY MOUTH EVERY DAY   90 capsule   1   . tiotropium (SPIRIVA) 18 MCG inhalation capsule   Inhalation   Place 1 capsule (18 mcg total) into inhaler and inhale daily. Patient taking differently: Place 18 mcg into inhaler and inhale 2 (two) times daily.    30 capsule   12      Review of Systems  Constitutional: Negative for fever and chills.  HENT: Negative for hearing loss.   Eyes: Negative.  Negative for pain.  Respiratory: Negative for cough, sputum production, shortness of breath and wheezing.   Cardiovascular: Negative for chest pain and leg swelling.  Gastrointestinal: Negative for heartburn, vomiting and abdominal pain.  Genitourinary: Negative.   Musculoskeletal: Negative for myalgias and neck pain.  Skin: Negative for rash.  Neurological: Negative.  Negative for headaches.  Endo/Heme/Allergies: Negative.   Psychiatric/Behavioral: Negative.       Allergies:  Vicodin  Physical Examination:  VS: BP 138/72 mmHg  Pulse 68  Ht 5' 4"  (1.626 m)  Wt 168 lb 12.8 oz (76.567 kg)  BMI 28.96 kg/m2  SpO2 98%  General Appearance: No distress  HEENT: PERRLA, no ptosis, no other lesions noticed Pulmonary:upper airway coarse sounds, mild dec basilar BS, no wheezing.  Cardiovascular:  Normal S1,S2.  No m/r/g.     Abdomen:Exam: Benign, Soft, non-tender, No masses  Skin:   warm, no rashes, no ecchymosis  Extremities: normal, no cyanosis, clubbing, warm with normal capillary refill.       Assessment and Plan:69 yo F with COPD Stage B, seen for follow up visit, now with AECOPD COPD type B Patient  with history of COPD B, Moderate airflow limitation (GOLD STAGE I) today AECOP, I believe that chronic exposure to secondhand smoke is causing copd exacerbations Currently on Advair and spiriva, and doing well.   Plan Continue with Spiriva and Advair Avoid any secondhand smoke exposure as much as possible          Updated  Medication List Outpatient Encounter Prescriptions as of 06/25/2015  Medication Sig  . albuterol (PROVENTIL HFA;VENTOLIN HFA) 108 (90 BASE) MCG/ACT inhaler Inhale 2 puffs into the lungs every 6 (six) hours as needed for wheezing.  Marland Kitchen ALPRAZolam (XANAX) 0.25 MG tablet TAKE 1 TABLET BY MOUTH AS NEEDED FOR SLEEP (Patient taking differently: Take 0.25 mg by mouth at bedtime as needed for sleep. TAKE 1 TABLET BY MOUTH AS NEEDED FOR SLEEP)  . aspirin EC 81 MG tablet Take 81 mg by mouth daily.  Marland Kitchen atenolol (TENORMIN) 50 MG tablet Take 1 tablet (50 mg total) by mouth 2 (two) times daily.  Marland Kitchen atorvastatin (LIPITOR) 40 MG tablet TAKE 1 TABLET BY MOUTH DAILY AT 6 PM.  . Blood Glucose Monitoring Suppl (ONE TOUCH ULTRA SYSTEM KIT) W/DEVICE KIT Pt needs OneTouch Ultra Blue glucometer dx 250.02  . diazepam (VALIUM) 10 MG tablet Take 1 tablet (10 mg total) by mouth every 12 (twelve) hours as needed (muscle spasm). (Patient taking differently: Take 10 mg by mouth every 12 (twelve) hours as needed. For muscle spasms)  . Fluticasone-Salmeterol (ADVAIR DISKUS) 500-50 MCG/DOSE AEPB Inhale 1 puff into the lungs 2 (two) times daily. (Patient taking differently: Inhale 1 puff into the lungs daily. )  . glipiZIDE (GLUCOTROL) 10 MG tablet TAKE 1 TABLET BY MOUTH 2 TIMES DAILY BEFORE A MEAL.  Marland Kitchen glucose blood (ONE TOUCH ULTRA TEST) test strip Use as instructed bid, has OneTouch Ultra Blue  dx: 250.02  . hydrochlorothiazide (HYDRODIURIL) 12.5 MG tablet TAKE 1 TABLET (12.5 MG TOTAL) BY MOUTH DAILY.  Marland Kitchen Lancets (ONETOUCH ULTRASOFT) lancets Use as instructed bid dx 250.02  . lisinopril (PRINIVIL,ZESTRIL) 5 MG tablet Take 1 tablet (5 mg total) by mouth daily.  . meloxicam (MOBIC) 7.5 MG tablet TAKE 1 TABLET BY MOUTH EVERY DAY  . metFORMIN (GLUCOPHAGE) 1000 MG tablet Take 1 tablet (1,000 mg total) by mouth 2 (two) times daily with a meal.  . omeprazole (PRILOSEC) 20 MG capsule TAKE ONE CAPSULE BY MOUTH EVERY DAY  . tiotropium (SPIRIVA)  18 MCG inhalation capsule Place 1 capsule (18 mcg total) into inhaler and inhale daily. (Patient taking differently: Place 18 mcg into inhaler and inhale 2 (two) times daily. )  . [DISCONTINUED] azithromycin (ZITHROMAX) 250 MG tablet Take 2 tablets Day 1 and then 1 tablet daily till gone (Patient not taking: Reported on 06/25/2015)   Facility-Administered Encounter Medications as of 06/25/2015  Medication  . [START ON 06/26/2015] ceFAZolin (ANCEF) IVPB 2 g/50 mL premix    Orders for this visit: Orders Placed This Encounter  Procedures  . Incentive spirometry RT    Standing Status: Future     Number of Occurrences:      Standing Expiration Date: 06/24/2016    Thank  you for the visitation and for allowing  [facility] to assist in the care of your patient. Our recommendations are noted above.  Please contact us if we can be of further service.  Vilinda Boehringer, MD [facility] Office Number: [phone]

## 2015-06-25 NOTE — Assessment & Plan Note (Signed)
Patient with history of COPD B, Moderate airflow limitation (GOLD STAGE I) today AECOP, I believe that chronic exposure to secondhand smoke is causing copd exacerbations Currently on Advair and spiriva, and doing well.   Plan Continue with Spiriva and Advair Avoid any secondhand smoke exposure as much as possible

## 2015-06-26 ENCOUNTER — Encounter (HOSPITAL_COMMUNITY)
Admission: RE | Disposition: A | Discharge status: Home / Self Care | Payer: Self-pay | Source: Ambulatory Visit | Attending: Neurosurgery

## 2015-06-26 ENCOUNTER — Encounter (HOSPITAL_COMMUNITY): Payer: Self-pay | Admitting: General Practice

## 2015-06-26 ENCOUNTER — Observation Stay (HOSPITAL_COMMUNITY)
Admission: RE | Admit: 2015-06-26 | Discharge: 2015-06-27 | Disposition: A | Discharge status: Home / Self Care | Payer: Medicare Other | Source: Ambulatory Visit | Attending: Neurosurgery | Admitting: Neurosurgery

## 2015-06-26 ENCOUNTER — Ambulatory Visit (HOSPITAL_COMMUNITY): Payer: Medicare Other

## 2015-06-26 ENCOUNTER — Ambulatory Visit (HOSPITAL_COMMUNITY): Payer: Medicare Other | Admitting: Emergency Medicine

## 2015-06-26 ENCOUNTER — Ambulatory Visit (HOSPITAL_COMMUNITY): Payer: Medicare Other | Admitting: Certified Registered Nurse Anesthetist

## 2015-06-26 DIAGNOSIS — Z7982 Long term (current) use of aspirin: Secondary | ICD-10-CM | POA: Diagnosis not present

## 2015-06-26 DIAGNOSIS — J449 Chronic obstructive pulmonary disease, unspecified: Secondary | ICD-10-CM | POA: Diagnosis not present

## 2015-06-26 DIAGNOSIS — I10 Essential (primary) hypertension: Secondary | ICD-10-CM | POA: Diagnosis not present

## 2015-06-26 DIAGNOSIS — M549 Dorsalgia, unspecified: Secondary | ICD-10-CM

## 2015-06-26 DIAGNOSIS — Z87891 Personal history of nicotine dependence: Secondary | ICD-10-CM | POA: Diagnosis not present

## 2015-06-26 DIAGNOSIS — M47816 Spondylosis without myelopathy or radiculopathy, lumbar region: Principal | ICD-10-CM | POA: Insufficient documentation

## 2015-06-26 DIAGNOSIS — E119 Type 2 diabetes mellitus without complications: Secondary | ICD-10-CM | POA: Diagnosis not present

## 2015-06-26 DIAGNOSIS — K219 Gastro-esophageal reflux disease without esophagitis: Secondary | ICD-10-CM | POA: Diagnosis not present

## 2015-06-26 DIAGNOSIS — Z79899 Other long term (current) drug therapy: Secondary | ICD-10-CM | POA: Diagnosis not present

## 2015-06-26 DIAGNOSIS — M1990 Unspecified osteoarthritis, unspecified site: Secondary | ICD-10-CM | POA: Insufficient documentation

## 2015-06-26 DIAGNOSIS — E785 Hyperlipidemia, unspecified: Secondary | ICD-10-CM | POA: Insufficient documentation

## 2015-06-26 DIAGNOSIS — Z7984 Long term (current) use of oral hypoglycemic drugs: Secondary | ICD-10-CM | POA: Diagnosis not present

## 2015-06-26 DIAGNOSIS — M48062 Spinal stenosis, lumbar region with neurogenic claudication: Secondary | ICD-10-CM | POA: Diagnosis present

## 2015-06-26 HISTORY — PX: LUMBAR LAMINECTOMY/DECOMPRESSION MICRODISCECTOMY: SHX5026

## 2015-06-26 LAB — GLUCOSE, CAPILLARY
Glucose-Capillary: 186 mg/dL — ABNORMAL HIGH (ref 65–99)
Glucose-Capillary: 129 mg/dL — ABNORMAL HIGH (ref 65–99)
Glucose-Capillary: 167 mg/dL — ABNORMAL HIGH (ref 65–99)
Glucose-Capillary: 121 mg/dL — ABNORMAL HIGH (ref 65–99)

## 2015-06-26 SURGERY — LUMBAR LAMINECTOMY/DECOMPRESSION MICRODISCECTOMY 1 LEVEL
Anesthesia: General | Site: Back

## 2015-06-26 MED ORDER — SUCCINYLCHOLINE CHLORIDE 20 MG/ML IJ SOLN
INTRAMUSCULAR | Status: AC
  Filled 2015-06-26: qty 2

## 2015-06-26 MED ORDER — PHENYLEPHRINE 40 MCG/ML (10ML) SYRINGE FOR IV PUSH (FOR BLOOD PRESSURE SUPPORT)
PREFILLED_SYRINGE | INTRAVENOUS | Status: AC
  Filled 2015-06-26: qty 10

## 2015-06-26 MED ORDER — ATORVASTATIN CALCIUM 40 MG PO TABS
40.0000 mg | ORAL_TABLET | Freq: Every day | ORAL | Status: DC
  Administered 2015-06-26: 40 mg via ORAL
  Filled 2015-06-26 (×2): qty 1
  Filled 2015-06-26: qty 2

## 2015-06-26 MED ORDER — ONDANSETRON HCL 4 MG/2ML IJ SOLN: 4.0000 mg | Freq: Four times a day (QID) | INTRAMUSCULAR | Status: DC | PRN

## 2015-06-26 MED ORDER — HYDROMORPHONE HCL 1 MG/ML IJ SOLN
0.2500 mg | INTRAMUSCULAR | Status: DC | PRN
  Administered 2015-06-26: 0.5 mg via INTRAVENOUS

## 2015-06-26 MED ORDER — BISACODYL 10 MG RE SUPP: 10.0000 mg | Freq: Every day | RECTAL | Status: DC | PRN

## 2015-06-26 MED ORDER — SODIUM CHLORIDE 0.9 % IJ SOLN
INTRAMUSCULAR | Status: AC
  Filled 2015-06-26: qty 10

## 2015-06-26 MED ORDER — SODIUM CHLORIDE 0.9 % IV SOLN: INTRAVENOUS | Status: DC

## 2015-06-26 MED ORDER — SODIUM CHLORIDE 0.9 % IJ SOLN
3.0000 mL | Freq: Two times a day (BID) | INTRAMUSCULAR | Status: DC
  Administered 2015-06-26 (×2): 3 mL via INTRAVENOUS

## 2015-06-26 MED ORDER — LIDOCAINE HCL (CARDIAC) 20 MG/ML IV SOLN
INTRAVENOUS | Status: DC | PRN
  Administered 2015-06-26: 60 mg via INTRAVENOUS

## 2015-06-26 MED ORDER — ALBUTEROL SULFATE (2.5 MG/3ML) 0.083% IN NEBU: 3.0000 mL | INHALATION_SOLUTION | Freq: Four times a day (QID) | RESPIRATORY_TRACT | Status: DC | PRN

## 2015-06-26 MED ORDER — EPHEDRINE SULFATE 50 MG/ML IJ SOLN
INTRAMUSCULAR | Status: DC | PRN
  Administered 2015-06-26: 10 mg via INTRAVENOUS

## 2015-06-26 MED ORDER — HYDROCHLOROTHIAZIDE 25 MG PO TABS: 12.5000 mg | ORAL_TABLET | Freq: Every day | ORAL | Status: DC

## 2015-06-26 MED ORDER — OXYCODONE-ACETAMINOPHEN 5-325 MG PO TABS
1.0000 | ORAL_TABLET | ORAL | Status: DC | PRN
  Administered 2015-06-26: 2 via ORAL
  Filled 2015-06-26: qty 2

## 2015-06-26 MED ORDER — ACETAMINOPHEN 325 MG PO TABS: 325.0000 mg | ORAL_TABLET | ORAL | Status: DC | PRN

## 2015-06-26 MED ORDER — ALUM & MAG HYDROXIDE-SIMETH 200-200-20 MG/5ML PO SUSP: 30.0000 mL | Freq: Four times a day (QID) | ORAL | Status: DC | PRN

## 2015-06-26 MED ORDER — ONDANSETRON HCL 4 MG/2ML IJ SOLN
INTRAMUSCULAR | Status: AC
  Filled 2015-06-26: qty 4

## 2015-06-26 MED ORDER — ONDANSETRON HCL 4 MG PO TABS: 4.0000 mg | ORAL_TABLET | Freq: Four times a day (QID) | ORAL | Status: DC | PRN

## 2015-06-26 MED ORDER — LISINOPRIL 5 MG PO TABS
5.0000 mg | ORAL_TABLET | Freq: Every day | ORAL | Status: DC
  Filled 2015-06-26: qty 1

## 2015-06-26 MED ORDER — FENTANYL CITRATE (PF) 250 MCG/5ML IJ SOLN
INTRAMUSCULAR | Status: AC
  Filled 2015-06-26: qty 5

## 2015-06-26 MED ORDER — ALPRAZOLAM 0.25 MG PO TABS: 0.2500 mg | ORAL_TABLET | Freq: Every evening | ORAL | Status: DC | PRN

## 2015-06-26 MED ORDER — ACETAMINOPHEN 650 MG RE SUPP: 650.0000 mg | RECTAL | Status: DC | PRN

## 2015-06-26 MED ORDER — HEMOSTATIC AGENTS (NO CHARGE) OPTIME
TOPICAL | Status: DC | PRN
  Administered 2015-06-26: 1 via TOPICAL

## 2015-06-26 MED ORDER — METFORMIN HCL 500 MG PO TABS
1000.0000 mg | ORAL_TABLET | Freq: Two times a day (BID) | ORAL | Status: DC
  Administered 2015-06-26: 1000 mg via ORAL
  Filled 2015-06-26: qty 2

## 2015-06-26 MED ORDER — FENTANYL CITRATE (PF) 100 MCG/2ML IJ SOLN
INTRAMUSCULAR | Status: DC | PRN
  Administered 2015-06-26: 50 ug via INTRAVENOUS
  Administered 2015-06-26: 200 ug via INTRAVENOUS

## 2015-06-26 MED ORDER — HYDROCODONE-ACETAMINOPHEN 5-325 MG PO TABS: 1.0000 | ORAL_TABLET | ORAL | Status: DC | PRN

## 2015-06-26 MED ORDER — CYCLOBENZAPRINE HCL 10 MG PO TABS: 10.0000 mg | ORAL_TABLET | Freq: Three times a day (TID) | ORAL | Status: DC | PRN

## 2015-06-26 MED ORDER — 0.9 % SODIUM CHLORIDE (POUR BTL) OPTIME
TOPICAL | Status: DC | PRN
  Administered 2015-06-26: 1000 mL

## 2015-06-26 MED ORDER — MOMETASONE FURO-FORMOTEROL FUM 200-5 MCG/ACT IN AERO
2.0000 | INHALATION_SPRAY | Freq: Two times a day (BID) | RESPIRATORY_TRACT | Status: DC
  Administered 2015-06-26: 2 via RESPIRATORY_TRACT
  Filled 2015-06-26: qty 8.8

## 2015-06-26 MED ORDER — KETOROLAC TROMETHAMINE 30 MG/ML IJ SOLN
30.0000 mg | Freq: Once | INTRAMUSCULAR | Status: AC
  Administered 2015-06-26: 30 mg via INTRAVENOUS

## 2015-06-26 MED ORDER — DEXAMETHASONE SODIUM PHOSPHATE 4 MG/ML IJ SOLN
INTRAMUSCULAR | Status: AC
  Filled 2015-06-26: qty 1

## 2015-06-26 MED ORDER — KETOROLAC TROMETHAMINE 30 MG/ML IJ SOLN
INTRAMUSCULAR | Status: AC
  Filled 2015-06-26: qty 1

## 2015-06-26 MED ORDER — ACETAMINOPHEN 325 MG PO TABS: 650.0000 mg | ORAL_TABLET | ORAL | Status: DC | PRN

## 2015-06-26 MED ORDER — CEFAZOLIN SODIUM-DEXTROSE 2-3 GM-% IV SOLR
INTRAVENOUS | Status: DC | PRN
  Administered 2015-06-26: 2 g via INTRAVENOUS

## 2015-06-26 MED ORDER — GLIPIZIDE 10 MG PO TABS
10.0000 mg | ORAL_TABLET | Freq: Two times a day (BID) | ORAL | Status: DC
  Administered 2015-06-26: 10 mg via ORAL
  Filled 2015-06-26 (×4): qty 1

## 2015-06-26 MED ORDER — KETOROLAC TROMETHAMINE 15 MG/ML IJ SOLN
15.0000 mg | Freq: Four times a day (QID) | INTRAMUSCULAR | Status: DC
  Administered 2015-06-26 – 2015-06-27 (×2): 15 mg via INTRAVENOUS
  Filled 2015-06-26 (×2): qty 1

## 2015-06-26 MED ORDER — SUGAMMADEX SODIUM 200 MG/2ML IV SOLN
INTRAVENOUS | Status: AC
  Filled 2015-06-26: qty 2

## 2015-06-26 MED ORDER — MORPHINE SULFATE (PF) 4 MG/ML IV SOLN: 4.0000 mg | INTRAVENOUS | Status: DC | PRN

## 2015-06-26 MED ORDER — PHENOL 1.4 % MT LIQD: 1.0000 | OROMUCOSAL | Status: DC | PRN

## 2015-06-26 MED ORDER — PANTOPRAZOLE SODIUM 40 MG PO TBEC
40.0000 mg | DELAYED_RELEASE_TABLET | Freq: Every day | ORAL | Status: DC
Start: 2015-06-27 — End: 2015-06-27

## 2015-06-26 MED ORDER — SUGAMMADEX SODIUM 200 MG/2ML IV SOLN
INTRAVENOUS | Status: DC | PRN
  Administered 2015-06-26: 153.2 mg via INTRAVENOUS

## 2015-06-26 MED ORDER — PROPOFOL 10 MG/ML IV BOLUS
INTRAVENOUS | Status: DC | PRN
  Administered 2015-06-26: 160 mg via INTRAVENOUS

## 2015-06-26 MED ORDER — LIDOCAINE HCL (CARDIAC) 20 MG/ML IV SOLN
INTRAVENOUS | Status: AC
  Filled 2015-06-26: qty 10

## 2015-06-26 MED ORDER — THROMBIN 5000 UNITS EX SOLR
OROMUCOSAL | Status: DC | PRN
  Administered 2015-06-26: 10 mL via TOPICAL

## 2015-06-26 MED ORDER — ROCURONIUM BROMIDE 50 MG/5ML IV SOLN
INTRAVENOUS | Status: AC
Start: 2015-06-26 — End: 2015-06-26
  Filled 2015-06-26: qty 2

## 2015-06-26 MED ORDER — ROCURONIUM BROMIDE 100 MG/10ML IV SOLN
INTRAVENOUS | Status: DC | PRN
  Administered 2015-06-26: 50 mg via INTRAVENOUS

## 2015-06-26 MED ORDER — TIOTROPIUM BROMIDE MONOHYDRATE 18 MCG IN CAPS
18.0000 ug | ORAL_CAPSULE | Freq: Every day | RESPIRATORY_TRACT | Status: DC
  Filled 2015-06-26: qty 5

## 2015-06-26 MED ORDER — MENTHOL 3 MG MT LOZG: 1.0000 | LOZENGE | OROMUCOSAL | Status: DC | PRN

## 2015-06-26 MED ORDER — PHENYLEPHRINE HCL 10 MG/ML IJ SOLN
INTRAMUSCULAR | Status: DC | PRN
  Administered 2015-06-26: 80 ug via INTRAVENOUS

## 2015-06-26 MED ORDER — ONDANSETRON HCL 4 MG/2ML IJ SOLN
INTRAMUSCULAR | Status: DC | PRN
  Administered 2015-06-26: 4 mg via INTRAVENOUS

## 2015-06-26 MED ORDER — THROMBIN 5000 UNITS EX SOLR
CUTANEOUS | Status: DC | PRN
  Administered 2015-06-26 (×2): 5000 [IU] via TOPICAL

## 2015-06-26 MED ORDER — PROPOFOL 10 MG/ML IV BOLUS
INTRAVENOUS | Status: AC
  Filled 2015-06-26: qty 40

## 2015-06-26 MED ORDER — SODIUM CHLORIDE 0.9 % IJ SOLN: 3.0000 mL | INTRAMUSCULAR | Status: DC | PRN

## 2015-06-26 MED ORDER — OXYCODONE HCL 5 MG PO TABS: 5.0000 mg | ORAL_TABLET | Freq: Once | ORAL | Status: DC | PRN

## 2015-06-26 MED ORDER — LIDOCAINE-EPINEPHRINE 1 %-1:100000 IJ SOLN
INTRAMUSCULAR | Status: DC | PRN
  Administered 2015-06-26: 5 mL

## 2015-06-26 MED ORDER — SODIUM CHLORIDE 0.9 % IR SOLN
Status: DC | PRN
  Administered 2015-06-26: 500 mL

## 2015-06-26 MED ORDER — LACTATED RINGERS IV SOLN
INTRAVENOUS | Status: DC
  Administered 2015-06-26 (×3): via INTRAVENOUS

## 2015-06-26 MED ORDER — ACETAMINOPHEN 10 MG/ML IV SOLN
INTRAVENOUS | Status: AC
  Administered 2015-06-26: 1000 mg via INTRAVENOUS
  Filled 2015-06-26: qty 100

## 2015-06-26 MED ORDER — DEXAMETHASONE SODIUM PHOSPHATE 4 MG/ML IJ SOLN
INTRAMUSCULAR | Status: DC | PRN
  Administered 2015-06-26: 4 mg via INTRAVENOUS

## 2015-06-26 MED ORDER — KETOROLAC TROMETHAMINE 30 MG/ML IJ SOLN: 30.0000 mg | Freq: Four times a day (QID) | INTRAMUSCULAR | Status: DC

## 2015-06-26 MED ORDER — ACETAMINOPHEN 160 MG/5ML PO SOLN: 325.0000 mg | ORAL | Status: DC | PRN

## 2015-06-26 MED ORDER — ATENOLOL 50 MG PO TABS
50.0000 mg | ORAL_TABLET | Freq: Two times a day (BID) | ORAL | Status: DC
  Filled 2015-06-26 (×3): qty 1

## 2015-06-26 MED ORDER — MIDAZOLAM HCL 2 MG/2ML IJ SOLN
INTRAMUSCULAR | Status: AC
Start: 2015-06-26 — End: 2015-06-26
  Filled 2015-06-26: qty 2

## 2015-06-26 MED ORDER — MAGNESIUM HYDROXIDE 400 MG/5ML PO SUSP: 30.0000 mL | Freq: Every day | ORAL | Status: DC | PRN

## 2015-06-26 MED ORDER — HYDROXYZINE HCL 50 MG/ML IM SOLN
50.0000 mg | INTRAMUSCULAR | Status: DC | PRN
  Administered 2015-06-26: 50 mg via INTRAMUSCULAR
  Filled 2015-06-26: qty 1

## 2015-06-26 MED ORDER — OXYCODONE HCL 5 MG/5ML PO SOLN: 5.0000 mg | Freq: Once | ORAL | Status: DC | PRN

## 2015-06-26 MED ORDER — HYDROMORPHONE HCL 1 MG/ML IJ SOLN
INTRAMUSCULAR | Status: AC
  Filled 2015-06-26: qty 1

## 2015-06-26 MED ORDER — BUPIVACAINE HCL (PF) 0.5 % IJ SOLN
INTRAMUSCULAR | Status: DC | PRN
  Administered 2015-06-26: 5 mL

## 2015-06-26 MED ORDER — EPHEDRINE SULFATE 50 MG/ML IJ SOLN
INTRAMUSCULAR | Status: AC
  Filled 2015-06-26: qty 1

## 2015-06-26 MED ORDER — HYDROXYZINE HCL 25 MG PO TABS: 50.0000 mg | ORAL_TABLET | ORAL | Status: DC | PRN

## 2015-06-26 MED ORDER — FENTANYL CITRATE (PF) 100 MCG/2ML IJ SOLN
INTRAMUSCULAR | Status: AC
  Filled 2015-06-26: qty 2

## 2015-06-26 SURGICAL SUPPLY — 57 items
BAG DECANTER FOR FLEXI CONT (MISCELLANEOUS) ×2 IMPLANT
BENZOIN TINCTURE PRP APPL 2/3 (GAUZE/BANDAGES/DRESSINGS) IMPLANT
BLADE CLIPPER SURG (BLADE) IMPLANT
BRUSH SCRUB EZ PLAIN DRY (MISCELLANEOUS) ×2 IMPLANT
BUR ACORN 6.0 ACORN (BURR) IMPLANT
BUR ACRON 5.0MM COATED (BURR) IMPLANT
BUR MATCHSTICK NEURO 3.0 LAGG (BURR) ×2 IMPLANT
CANISTER SUCT 3000ML PPV (MISCELLANEOUS) ×2 IMPLANT
DERMABOND ADHESIVE PROPEN (GAUZE/BANDAGES/DRESSINGS) ×1
DERMABOND ADVANCED (GAUZE/BANDAGES/DRESSINGS)
DERMABOND ADVANCED .7 DNX12 (GAUZE/BANDAGES/DRESSINGS) IMPLANT
DERMABOND ADVANCED .7 DNX6 (GAUZE/BANDAGES/DRESSINGS) ×1 IMPLANT
DRAPE LAPAROTOMY 100X72X124 (DRAPES) ×2 IMPLANT
DRAPE MICROSCOPE LEICA (MISCELLANEOUS) ×2 IMPLANT
DRAPE POUCH INSTRU U-SHP 10X18 (DRAPES) ×2 IMPLANT
DRSG EMULSION OIL 3X3 NADH (GAUZE/BANDAGES/DRESSINGS) IMPLANT
ELECT REM PT RETURN 9FT ADLT (ELECTROSURGICAL) ×2
ELECTRODE REM PT RTRN 9FT ADLT (ELECTROSURGICAL) ×1 IMPLANT
GAUZE SPONGE 4X4 12PLY STRL (GAUZE/BANDAGES/DRESSINGS) IMPLANT
GAUZE SPONGE 4X4 16PLY XRAY LF (GAUZE/BANDAGES/DRESSINGS) IMPLANT
GLOVE BIO SURGEON STRL SZ7 (GLOVE) ×4 IMPLANT
GLOVE BIOGEL PI IND STRL 7.5 (GLOVE) ×3 IMPLANT
GLOVE BIOGEL PI IND STRL 8 (GLOVE) ×1 IMPLANT
GLOVE BIOGEL PI INDICATOR 7.5 (GLOVE) ×3
GLOVE BIOGEL PI INDICATOR 8 (GLOVE) ×1
GLOVE ECLIPSE 7.5 STRL STRAW (GLOVE) ×2 IMPLANT
GLOVE EXAM NITRILE LRG STRL (GLOVE) IMPLANT
GLOVE EXAM NITRILE MD LF STRL (GLOVE) IMPLANT
GLOVE EXAM NITRILE XL STR (GLOVE) IMPLANT
GLOVE EXAM NITRILE XS STR PU (GLOVE) IMPLANT
GOWN STRL REUS W/ TWL LRG LVL3 (GOWN DISPOSABLE) ×1 IMPLANT
GOWN STRL REUS W/ TWL XL LVL3 (GOWN DISPOSABLE) IMPLANT
GOWN STRL REUS W/TWL 2XL LVL3 (GOWN DISPOSABLE) IMPLANT
GOWN STRL REUS W/TWL LRG LVL3 (GOWN DISPOSABLE) ×1
GOWN STRL REUS W/TWL XL LVL3 (GOWN DISPOSABLE)
KIT BASIN OR (CUSTOM PROCEDURE TRAY) ×2 IMPLANT
KIT ROOM TURNOVER OR (KITS) ×2 IMPLANT
NEEDLE HYPO 18GX1.5 BLUNT FILL (NEEDLE) IMPLANT
NEEDLE SPNL 18GX3.5 QUINCKE PK (NEEDLE) ×2 IMPLANT
NEEDLE SPNL 22GX3.5 QUINCKE BK (NEEDLE) ×2 IMPLANT
NS IRRIG 1000ML POUR BTL (IV SOLUTION) ×2 IMPLANT
PACK LAMINECTOMY NEURO (CUSTOM PROCEDURE TRAY) ×2 IMPLANT
PAD ARMBOARD 7.5X6 YLW CONV (MISCELLANEOUS) ×6 IMPLANT
PATTIES SURGICAL .5 X1 (DISPOSABLE) ×2 IMPLANT
RUBBERBAND STERILE (MISCELLANEOUS) ×4 IMPLANT
SPONGE LAP 4X18 X RAY DECT (DISPOSABLE) IMPLANT
SPONGE SURGIFOAM ABS GEL SZ50 (HEMOSTASIS) ×2 IMPLANT
STRIP CLOSURE SKIN 1/2X4 (GAUZE/BANDAGES/DRESSINGS) IMPLANT
SUT PROLENE 6 0 BV (SUTURE) IMPLANT
SUT VIC AB 1 CT1 18XBRD ANBCTR (SUTURE) ×1 IMPLANT
SUT VIC AB 1 CT1 8-18 (SUTURE) ×1
SUT VIC AB 2-0 CP2 18 (SUTURE) ×2 IMPLANT
SUT VIC AB 3-0 SH 8-18 (SUTURE) IMPLANT
SYR 5ML LL (SYRINGE) IMPLANT
TOWEL OR 17X24 6PK STRL BLUE (TOWEL DISPOSABLE) ×2 IMPLANT
TOWEL OR 17X26 10 PK STRL BLUE (TOWEL DISPOSABLE) ×2 IMPLANT
WATER STERILE IRR 1000ML POUR (IV SOLUTION) ×2 IMPLANT

## 2015-06-26 NOTE — Addendum Note (Signed)
Addendum  created 06/26/15 1549 by Leonor Liv, CRNA   Modules edited: Anesthesia Medication Administration

## 2015-06-26 NOTE — Progress Notes (Signed)
Orthopedic Tech Progress Note Patient Details:  Maria Hinton December 27, 1945 [id number] Patient already has brace. Patient ID: Maria Hinton, female   DOB: [birth year], 69 y.o.   MRN: [id number]   Braulio Bosch 06/26/2015, 3:36 PM

## 2015-06-26 NOTE — Transfer of Care (Signed)
Immediate Anesthesia Transfer of Care Note  Patient: Maria Hinton  Procedure(s) Performed: Procedure(s) with comments: LUMBAR LAMINECTOMY/DECOMPRESSION MICRODISCECTOMY 1 LEVEL (N/A) - L3 and L4 Laminectomies  Patient Location: PACU  Anesthesia Type:General  Level of Consciousness: sedated  Airway & Oxygen Therapy: Patient Spontanous Breathing and Patient connected to nasal cannula oxygen  Post-op Assessment: Report given to RN and Post -op Vital signs reviewed and stable  Post vital signs: Reviewed and stable  Last Vitals:  Filed Vitals:   06/26/15 0917 06/26/15 1402  BP: 108/60   Pulse: 66   Temp: 36.8 C 36.3 C  Resp: 18     Complications: No apparent anesthesia complications

## 2015-06-26 NOTE — H&P (Addendum)
Subjective: Patient is a 69 y.o. right-handed white female who is admitted for treatment of L3-4 multifactorial lumbar stenosis. Patient's been having difficulty with neurogenic claudication. She complains of pain across her low back with pain and fatigue through her proximal lower extremities, with walking distances shortness 50 feet. She is admitted now for an L3 and L4 decompressive lumbar laminectomy.   Patient Active Problem List   Diagnosis Date Noted  . COPD exacerbation ([place]) 03/25/2015  . Headache, cervicogenic 05/20/2014  . COPD type B ([place]) 04/04/2014  . Extrinsic asthma, unspecified 02/21/2014  . History of tobacco abuse 02/21/2014  . Need for Tdap vaccination 02/09/2014  . Need for prophylactic vaccination against Streptococcus pneumoniae (pneumococcus) 02/09/2014  . Tobacco abuse counseling 02/09/2014  . Screening for osteoporosis 02/09/2014  . Pulmonary disease 02/09/2014  . Fatigue 11/28/2013  . Left shoulder pain 07/28/2013  . Diabetes mellitus, type II ([place]) 04/27/2013  . Encounter for smoking cessation counseling 04/27/2013  . Arthritis 04/27/2013  . HTN (hypertension), benign 04/11/2013  . HLD (hyperlipidemia) 04/11/2013   Past Medical History  Diagnosis Date  . PONV (postoperative nausea and vomiting)   . Hypertension     takes Metoprolol/HCTZ daily  . Hyperlipidemia     takes Pravastatin daily  . Asthma   . Pneumonia     x 2 ;in Dec 2010/2011  . Arthritis   . Joint swelling   . Joint pain     fingers  . Chronic back pain     DDD,herniated disc/spondylosis/radiculopathy  . GERD (gastroesophageal reflux disease)     takes Omeprazole daily  . Hemorrhoids   . History of colonic polyps     colonoscopy 04/2012 - Dr Candace Cruise  . Diabetes mellitus   . Early cataracts, bilateral     Dr. Ruthine Dose - [facility]  . COPD (chronic obstructive pulmonary disease) ([place])   . Insomnia     Past Surgical History  Procedure Laterality Date  . Thoracic disc  surgery  at age 84 and 17     mass of veins that had gotten into muscle and wrapped around rib;3 ribs also removed from left side  . Abdominal hysterectomy      at age 11  . Back surgery  20+yrs ago  . Foot surgery  2011    bunionectomy and knot removed from bottom of both feet  . Melanoma excision  2011    on nose/back on neck  . Vagina surgery      vaginal wall ruptured   . Tonsillectomy  as a child    and adenoids   . Colonoscopy    . Esophagogastroduodenoscopy  2011  . Tubal ligation    . Lumbar laminectomy/decompression microdiscectomy  09/10/2011    Procedure: LUMBAR LAMINECTOMY/DECOMPRESSION MICRODISCECTOMY 1 LEVEL;  Surgeon: Hosie Spangle, MD;  Location: [facility];  Service: Neurosurgery;  Laterality: Right;  RIGHT Lumbar  Laminotomy and microdiskectomy Lumbar Four-Five  . Total hip arthroplasty Left 2014    Dr. Mack Guise  . Colonoscopy N/A 05/13/2015    Procedure: COLONOSCOPY;  Surgeon: Hulen Luster, MD;  Location: [facility] ENDOSCOPY;  Service: Gastroenterology;  Laterality: N/A;  . Joint replacement Left 2014    hip  Kraskinski    Prescriptions prior to admission  Medication Sig Dispense Refill Last Dose  . albuterol (PROVENTIL HFA;VENTOLIN HFA) 108 (90 BASE) MCG/ACT inhaler Inhale 2 puffs into the lungs every 6 (six) hours as needed for wheezing. 6.7 g 11 06/26/2015 at 0730  .  ALPRAZolam (XANAX) 0.25 MG tablet TAKE 1 TABLET BY MOUTH AS NEEDED FOR SLEEP (Patient taking differently: Take 0.25 mg by mouth at bedtime as needed for sleep. TAKE 1 TABLET BY MOUTH AS NEEDED FOR SLEEP) 30 tablet 5 06/25/2015 at Unknown time  . aspirin EC 81 MG tablet Take 81 mg by mouth daily.   Past Week at Unknown time  . atenolol (TENORMIN) 50 MG tablet Take 1 tablet (50 mg total) by mouth 2 (two) times daily. 180 tablet 1 06/26/2015 at 0630  . atorvastatin (LIPITOR) 40 MG tablet TAKE 1 TABLET BY MOUTH DAILY AT 6 PM. 90 tablet 1 06/25/2015 at Unknown time  . diazepam (VALIUM) 10 MG tablet Take 1  tablet (10 mg total) by mouth every 12 (twelve) hours as needed (muscle spasm). (Patient taking differently: Take 10 mg by mouth every 12 (twelve) hours as needed. For muscle spasms) 30 tablet 1 Past Month at Unknown time  . Fluticasone-Salmeterol (ADVAIR DISKUS) 500-50 MCG/DOSE AEPB Inhale 1 puff into the lungs 2 (two) times daily. (Patient taking differently: Inhale 1 puff into the lungs daily. ) 60 each 0 06/26/2015 at 0730  . glipiZIDE (GLUCOTROL) 10 MG tablet TAKE 1 TABLET BY MOUTH 2 TIMES DAILY BEFORE A MEAL. 60 tablet 5 06/25/2015 at Unknown time  . hydrochlorothiazide (HYDRODIURIL) 12.5 MG tablet TAKE 1 TABLET (12.5 MG TOTAL) BY MOUTH DAILY. 90 tablet 1 06/26/2015 at 0630  . lisinopril (PRINIVIL,ZESTRIL) 5 MG tablet Take 1 tablet (5 mg total) by mouth daily. 90 tablet 3 06/26/2015 at 0630  . meloxicam (MOBIC) 7.5 MG tablet TAKE 1 TABLET BY MOUTH EVERY DAY 30 tablet 5 06/26/2015 at 0630  . metFORMIN (GLUCOPHAGE) 1000 MG tablet Take 1 tablet (1,000 mg total) by mouth 2 (two) times daily with a meal. 180 tablet 3 06/25/2015 at Unknown time  . omeprazole (PRILOSEC) 20 MG capsule TAKE ONE CAPSULE BY MOUTH EVERY DAY 90 capsule 1 06/26/2015 at 0630  . tiotropium (SPIRIVA) 18 MCG inhalation capsule Place 1 capsule (18 mcg total) into inhaler and inhale daily. (Patient taking differently: Place 18 mcg into inhaler and inhale 2 (two) times daily. ) 30 capsule 12 06/26/2015 at 0730  . Blood Glucose Monitoring Suppl (ONE TOUCH ULTRA SYSTEM KIT) W/DEVICE KIT Pt needs OneTouch Ultra Blue glucometer dx 250.02 1 each 0 Taking  . glucose blood (ONE TOUCH ULTRA TEST) test strip Use as instructed bid, has OneTouch Ultra Blue  dx: 250.02 100 each 3 Taking  . Lancets (ONETOUCH ULTRASOFT) lancets Use as instructed bid dx 250.02 100 each 3 Taking   Allergies  Allergen Reactions  . Vicodin [Hydrocodone-Acetaminophen] Other (See Comments)    Insomnia     Social History  Substance Use Topics  . Smoking status:  Former Smoker -- 1.50 packs/day for 20 years    Types: Cigarettes    Quit date: 02/04/2015  . Smokeless tobacco: Never Used     Comment: Pt quit 11/08/2014.Pt had quit smoking for 13 years before starting back in 2010  . Alcohol Use: No    Family History  Problem Relation Age of Onset  . Anesthesia problems Neg Hx   . Hypotension Neg Hx   . Malignant hyperthermia Neg Hx   . Pseudochol deficiency Neg Hx   . Heart disease Brother     valve replacement  . COPD Brother   . COPD Mother     Emphysema  . Diabetes Mother   . Heart disease Father   . Seizures Father   .  Cancer Father     Lung cancer with brain metastasis  . Asthma Brother   . COPD Son 41     Review of Systems A comprehensive review of systems was negative.  Objective: Vital signs in last 24 hours: Temp:  [98.2 F (36.8 C)] 98.2 F (36.8 C) (12/21 0917) Pulse Rate:  [66] 66 (12/21 0917) Resp:  [18] 18 (12/21 0917) BP: (108)/(60) 108/60 mmHg (12/21 0917) SpO2:  [98 %] 98 % (12/21 0917) Weight:  [76.567 kg (168 lb 12.8 oz)] 76.567 kg (168 lb 12.8 oz) (12/21 0917)  EXAM: Patient well-developed well-nourished white female in no acute distress. Lungs are clear to auscultation , the patient has symmetrical respiratory excursion. Heart has a regular rate and rhythm normal S1 and S2 no murmur.   Abdomen is soft nontender nondistended bowel sounds are present. Extremity examination shows no clubbing cyanosis or edema. Motor examination shows 5/5 strength in the iliopsoas, quadriceps, and plantar flexor bilaterally, as well as the left dorsiflexor and EHL. However the right dorsiflexor 4 minus/5 and the right EHL is 2-3/5. Sensation is decreased to pinprick in the medial aspect of the right foot. Reflex examination shows left quadriceps is 2, the right quadriceps is 1-2. Gastrocnemii are absent bilaterally. Toes are downgoing bilaterally. She has a normal gait and stance.   Data Review:CBC    Component Value Date/Time   WBC  11.7* 06/20/2015 1118   WBC 9.1 12/02/2012 0451   RBC 4.49 06/20/2015 1118   RBC 2.55* 12/02/2012 0451   HGB 14.1 06/20/2015 1118   HGB 8.2* 12/02/2012 0451   HCT 41.8 06/20/2015 1118   HCT 23.1* 12/02/2012 0451   PLT 294 06/20/2015 1118   PLT 196 12/02/2012 0451   MCV 93.1 06/20/2015 1118   MCV 91 12/02/2012 0451   MCH 31.4 06/20/2015 1118   MCH 32.0 12/02/2012 0451   MCHC 33.7 06/20/2015 1118   MCHC 35.3 12/02/2012 0451   RDW 12.7 06/20/2015 1118   RDW 12.3 12/02/2012 0451   LYMPHSABS 1.7 11/28/2013 0849   LYMPHSABS 1.9 12/02/2012 0451   MONOABS 0.5 11/28/2013 0849   MONOABS 0.9 12/02/2012 0451   EOSABS 0.7 11/28/2013 0849   EOSABS 0.5 12/02/2012 0451   BASOSABS 0.0 11/28/2013 0849   BASOSABS 0.0 12/02/2012 0451                          BMET    Component Value Date/Time   NA 138 06/20/2015 1118   NA 141 11/30/2012 0545   K 4.5 06/20/2015 1118   K 4.1 11/30/2012 0545   CL 102 06/20/2015 1118   CL 108* 11/30/2012 0545   CO2 24 06/20/2015 1118   CO2 26 11/30/2012 0545   GLUCOSE 128* 06/20/2015 1118   GLUCOSE 191* 11/30/2012 0545   BUN 20 06/20/2015 1118   BUN 22* 11/30/2012 0545   CREATININE 1.10* 06/20/2015 1118   CREATININE 1.10 11/30/2012 0545   CALCIUM 9.1 06/20/2015 1118   CALCIUM 7.9* 11/30/2012 0545   GFRNONAA 51* 06/20/2015 1118   GFRNONAA 53* 11/30/2012 0545   GFRAA 59* 06/20/2015 1118   GFRAA >60 11/30/2012 0545     Assessment/Plan: Patient with multifactorial lumbar stenosis, admitted for an L3 and L4 decompressive lumbar laminectomy.  I've discussed with the patient the nature of his condition, the nature the surgical procedure, the typical length of surgery, hospital stay, and overall recuperation. We discussed limitations postoperatively. I discussed risks of surgery including  risks of infection, bleeding, possibly need for transfusion, the risk of nerve root dysfunction with pain, weakness, numbness, or paresthesias, or risk of dural tear and CSF  leakage and possible need for further surgery, the risk of instability spine and the possible need for further surgery, and the risk of anesthetic complications including myocardial infarction, stroke, pneumonia, and death. Understanding all this the patient does wish to proceed with surgery and is admitted for such.    Hosie Spangle, MD 06/26/2015 11:48 AM

## 2015-06-26 NOTE — Anesthesia Procedure Notes (Signed)
Procedure Name: Intubation Date/Time: 06/26/2015 12:11 PM Performed by: Trixie Deis A Pre-anesthesia Checklist: Patient identified, Timeout performed, Emergency Drugs available, Suction available and Patient being monitored Patient Re-evaluated:Patient Re-evaluated prior to inductionOxygen Delivery Method: Circle system utilized Preoxygenation: Pre-oxygenation with 100% oxygen Intubation Type: IV induction Ventilation: Mask ventilation without difficulty Laryngoscope Size: Miller and 2 Grade View: Grade II Tube type: Oral Tube size: 7.0 mm Number of attempts: 2 (DL MAC 3 grade II, DL Mil 2 grade II) Airway Equipment and Method: Bougie stylet Placement Confirmation: ETT inserted through vocal cords under direct vision,  breath sounds checked- equal and bilateral and positive ETCO2 Secured at: 21 cm Tube secured with: Tape Dental Injury: Teeth and Oropharynx as per pre-operative assessment

## 2015-06-26 NOTE — Anesthesia Preprocedure Evaluation (Addendum)
Anesthesia Evaluation  Patient identified by MRN, date of birth, ID band Patient awake    Reviewed: Allergy & Precautions, NPO status , Patient's Chart, lab work & pertinent test results  History of Anesthesia Complications (+) PONV and history of anesthetic complications  Airway Mallampati: III  TM Distance: >3 FB Neck ROM: Full    Dental  (+) Teeth Intact,    Pulmonary neg shortness of breath, neg sleep apnea, COPD,  COPD inhaler, neg recent URI, former smoker,    breath sounds clear to auscultation       Cardiovascular hypertension, Pt. on medications (-) angina(-) Past MI and (-) CHF  Rhythm:Regular     Neuro/Psych  Headaches,  Neuromuscular disease negative psych ROS   GI/Hepatic GERD  Medicated and Controlled,  Endo/Other  diabetes, Type 2, Oral Hypoglycemic Agents  Renal/GU Renal InsufficiencyRenal disease     Musculoskeletal  (+) Arthritis ,   Abdominal   Peds  Hematology   Anesthesia Other Findings   Reproductive/Obstetrics                            Anesthesia Physical Anesthesia Plan  ASA: III  Anesthesia Plan: General   Post-op Pain Management:    Induction: Intravenous  Airway Management Planned: Oral ETT  Additional Equipment: None  Intra-op Plan:   Post-operative Plan: Extubation in OR  Informed Consent: I have reviewed the patients History and Physical, chart, labs and discussed the procedure including the risks, benefits and alternatives for the proposed anesthesia with the patient or authorized representative who has indicated his/her understanding and acceptance.   Dental advisory given  Plan Discussed with: CRNA and Surgeon  Anesthesia Plan Comments:         Anesthesia Quick Evaluation

## 2015-06-26 NOTE — Anesthesia Postprocedure Evaluation (Signed)
Anesthesia Post Note  Patient: Maria Hinton  Procedure(s) Performed: Procedure(s) (LRB): LUMBAR LAMINECTOMY/DECOMPRESSION MICRODISCECTOMY 1 LEVEL (N/A)  Patient location during evaluation: PACU Anesthesia Type: General Level of consciousness: awake Pain management: pain level controlled Vital Signs Assessment: post-procedure vital signs reviewed and stable Respiratory status: spontaneous breathing Cardiovascular status: stable Postop Assessment: no signs of nausea or vomiting Anesthetic complications: no    Last Vitals:  Filed Vitals:   06/26/15 1452 06/26/15 1518  BP:  130/75  Pulse: 68 75  Temp: 36 C 36.5 C  Resp: 11 18    Last Pain:  Filed Vitals:   06/26/15 1535  PainSc: 2                   

## 2015-06-26 NOTE — Op Note (Addendum)
06/26/2015  1:43 PM  PATIENT:  Maria Hinton  69 y.o. female  PRE-OPERATIVE DIAGNOSIS:  lumbar stenosis with neurogenic claudication, lumbar spondylosis, lumbar degenerative disc disease  POST-OPERATIVE DIAGNOSIS:  lumbar stenosis with neurogenic claudication, lumbar spondylosis, lumbar degenerative disc disease  PROCEDURE:  Procedure(s):  L3 and L4 decompressive lumbar laminectomy and bilateral medial facetectomy with bilateral L3 and L4 foraminotomies for decompression of the stenotic compression of the exiting L3 and L4 nerve roots bilaterally  SURGEON:  Surgeon(s): Jovita Gamma, MD Kary Kos, MD  ASSISTANTS: Kary Kos, M.D.  ANESTHESIA:   general  EBL:  Total I/O In: 1400 [I.V.:1400] Out: -   BLOOD ADMINISTERED:none  COUNT: Correct per nursing staff  DICTATION: Patient was brought to the operating room placed under general endotracheal anesthesia. Patient was turned to a prone position the lumbar region was prepped with Betadine soap and solution and draped in a sterile fashion. The midline was infiltrated with local anesthetic with epinephrine. A localizing x-ray was taken and then a midline incision was made carried down thru the subcutaneous tissue, bipolar cautery and electrocautery were used to maintain hemostasis. Dissection was carried down to the lumbar fascia which was incised bilaterally and the paraspinal muscles were dissected from the spinous process and lamina in a subperiosteal fashion. Another localizing x-ray was taken and the L3 and L4 levels were identified. Laminectomy was begun with double-action rongeurs, the high-speed drill, and Kerrison punches. The thickened ligamentum flavum was carefully removed. Dissection was carried laterally, with medial facetectomy, to decompress the lateral stenosis taking care to leave the facet complexes intact.  Laminotomy was performed so as to decompress the stenotic compression of the exiting L3 and L4 nerve roots  bilaterally. Once the decompression was completed hemostasis was established with the use of Surgifoam. Paraspinal muscles, deep fascia, and Scarpa's fascia were closed in separate layers with interrupted 1 undyed Vicryl sutures. The subcutaneous and subcuticular were closed with interrupted inverted 2-0 undyed Vicryl sutures. Skin edges were approximated with Dermabond.   PLAN OF CARE: Admit for overnight observation  PATIENT DISPOSITION:  PACU - hemodynamically stable.   Delay start of Pharmacological VTE agent (>24hrs) due to surgical blood loss or risk of bleeding:  yes

## 2015-06-27 ENCOUNTER — Encounter (HOSPITAL_COMMUNITY): Payer: Self-pay | Admitting: Neurosurgery

## 2015-06-27 DIAGNOSIS — M47816 Spondylosis without myelopathy or radiculopathy, lumbar region: Secondary | ICD-10-CM | POA: Diagnosis not present

## 2015-06-27 LAB — GLUCOSE, CAPILLARY: Glucose-Capillary: 155 mg/dL — ABNORMAL HIGH (ref 65–99)

## 2015-06-27 MED ORDER — OXYCODONE-ACETAMINOPHEN 5-325 MG PO TABS: 0.5000 | ORAL_TABLET | ORAL | Status: DC | PRN

## 2015-06-27 NOTE — Discharge Instructions (Signed)
Wound Care °Leave incision open to air. °You may shower. °Do not scrub directly on incision.  °Do not put any creams, lotions, or ointments on incision. °Activity °Walk each and every day, increasing distance each day. °No lifting greater than 5 lbs.  Avoid bending, arching, and twisting. °No driving for 2 weeks; may ride as a passenger locally. °If provided with back brace, wear when out of bed.  It is not necessary to wear in bed. °Diet °Resume your normal diet.  °Return to Work °Will be discussed at you follow up appointment. °Call Your Doctor If Any of These Occur °Redness, drainage, or swelling at the wound.  °Temperature greater than 101 degrees. °Severe pain not relieved by pain medication. °Incision starts to come apart. °Follow Up Appt °Call today for appointment in 3 weeks ([phone]) or for problems.  If you have any hardware placed in your spine, you will need an x-ray before your appointment. ° °Laminectomy °During a laminectomy, small pieces of bone in the spine called lamina are removed. The ligaments underneath the lamina and parts of the joints that have grown too large are also removed. This takes pressure off the nerves.  °LET YOUR HEALTH CARE PROVIDER KNOW ABOUT: °· Any allergies you have. °· All medicines you are taking, including vitamins, herbs, eye drops, creams, and over-the-counter medicines. °· Previous problems you or members of your family have had with the use of anesthetics. °· Any blood disorders you have. °· Previous surgeries you have had. °· Medical conditions you have. °RISKS AND COMPLICATIONS  °Generally, laminectomy is a safe procedure. However, as with any procedure, complications can occur. Possible complications include: °· Infection near the incision. °· Nerve damage. Signs of this can be pain, weakness, or numbness. °· Leaking of spinal fluid. °· Blood clot in a leg. The clot can move to the lungs. This can be very serious. °· Bowel or bladder incontinence (rare). °BEFORE  THE PROCEDURE  °· You will need to stop taking certain medicines as directed by your health care provider. °· If you smoke, stop at least 2 weeks before the procedure. Smoking can slow down the healing process and increase the risk of complications. °· Do not eat or drink anything for at least 8 hours before the procedure. Take any medicines that your health care provider tells you to keep taking with a sip of water. °· Do not drink alcohol the day before your surgery. °· Tell your health care provider if you develop a cold or any infection before your surgery. °· Arrange for someone to drive you home after the procedure or after your hospital stay. Also arrange for someone to help you with activities during recovery. °PROCEDURE °· Small monitors will be placed on your body. They are used to check your heart, blood pressure, and oxygen level. °· An IV tube will be inserted into one of your veins. Medicine will flow directly into your body through the IV tube. °· You might be given a sedative. This will help you relax. °· You will be given a medicine to make you sleep (general anesthetic), and a breathing tube will be placed into your lungs. During general anesthesia, you are unaware of the procedure and do not feel any pain. °· Your back will be cleaned with a special solution to kill germs on your skin. °· Once you are asleep, the surgeon will make a 2-inch to 5-inch cut (incision) in your back. The length of the incision will depend on how   many spinal bones (vertebrae) are being operated on. °· Muscles in the back will be moved away from the vertebrae and pulled to the side. °· Pieces of lamina will be removed. °· The ligament that lies under the lamina and connects your vertebrae will be removed. °· Enough ligaments and thickened joints will be removed to take pressure off your nerves. °· Your nerves will be identified, and their passage will be tracked and assessed for excessive tightness. °· Your back muscles  will be moved back into their normal position. °· The area under your skin will be closed with small, absorbable stitches. These stitches do not need to be removed. °· Your skin will be closed with small absorbable stitches or staples. °· A dressing will be put over your incision. °· The procedure may take 1-3 hours. °AFTER THE PROCEDURE  °· You will stay in a recovery area until the anesthesia has worn off. Your blood pressure and pulse will be checked every so often. Then you will be taken to a hospital room. °· You may continue to get fluids through the IV tube for a while. °· Some pain is normal. You may be given pain medicine while still in the recovery area. °· It is important to be up and moving as soon as possible after a surgery. Physical therapists will help you start walking. °· To prevent blood clots in your legs: °¨ You may be given special stockings to wear. °¨ You may need to take medicine to prevent clots. °· You may be asked to do special breathing exercises to re-expand your lungs. This is to prevent a lung infection. °· Most people stay in the hospital for 1-3 days after a laminectomy. °  °This information is not intended to replace advice given to you by your health care provider. Make sure you discuss any questions you have with your health care provider. °  °Document Released: 06/10/2009 Document Revised: 04/12/2013 Document Reviewed: 02/01/2013 °Elsevier Interactive Patient Education ©2016 [employer] ° °

## 2015-06-27 NOTE — Progress Notes (Signed)
Pt doing well. Pt given D/C instructions with Rx, verbal understanding was provided. Pt's incision is open to air and has no sign of infection. Pt's IV was removed prior to D/C. Pt D/C'd home via wheelchair @ 1045 per MD order. Pt is stable @ D/C and has no other needs at this time. Holli Humbles, RN

## 2015-06-27 NOTE — Discharge Summary (Signed)
Physician Discharge Summary  Patient ID: Maria Hinton MRN: [id number] DOB/AGE: December 21, 1945 69 y.o.  Admit date: 06/26/2015 Discharge date: 06/27/2015  Admission Diagnoses:  lumbar stenosis with neurogenic claudication, lumbar spondylosis, lumbar degenerative disc disease  Discharge Diagnoses: lumbar stenosis with neurogenic claudication, lumbar spondylosis, lumbar degenerative disc disease  Active Problems:   Lumbar stenosis with neurogenic claudication   Discharged Condition: good  Hospital Course: Patient was admitted, underwent the bilateral L3 and L4 decompressive lumbar laminectomy, medial facetectomy, and foraminotomies for decompression of lumbar stenosis and neurogenic claudication.  Postoperatively she's had excellent relief of her neurogenic claudication. She is up and ambulating actively. Her wound is healing nicely. There is no erythema, swelling, or drainage. She's been given instructions regarding wound care and activities following discharge. She is scheduled follow-up with me in about 3 weeks in the office.  Discharge Exam: Blood pressure 123/72, pulse 75, temperature 97.7 F (36.5 C), temperature source Oral, resp. rate 20, height 5' 4"  (1.626 m), weight 76.567 kg (168 lb 12.8 oz), SpO2 99 %.  Disposition: 01-Home or Self Care     Medication List    TAKE these medications        albuterol 108 (90 BASE) MCG/ACT inhaler  Commonly known as:  PROVENTIL HFA;VENTOLIN HFA  Inhale 2 puffs into the lungs every 6 (six) hours as needed for wheezing.     ALPRAZolam 0.25 MG tablet  Commonly known as:  XANAX  TAKE 1 TABLET BY MOUTH AS NEEDED FOR SLEEP     aspirin EC 81 MG tablet  Take 81 mg by mouth daily.     atenolol 50 MG tablet  Commonly known as:  TENORMIN  Take 1 tablet (50 mg total) by mouth 2 (two) times daily.     atorvastatin 40 MG tablet  Commonly known as:  LIPITOR  TAKE 1 TABLET BY MOUTH DAILY AT 6 PM.     diazepam 10 MG tablet  Commonly known as:   VALIUM  Take 1 tablet (10 mg total) by mouth every 12 (twelve) hours as needed (muscle spasm).     Fluticasone-Salmeterol 500-50 MCG/DOSE Aepb  Commonly known as:  ADVAIR DISKUS  Inhale 1 puff into the lungs 2 (two) times daily.     glipiZIDE 10 MG tablet  Commonly known as:  GLUCOTROL  TAKE 1 TABLET BY MOUTH 2 TIMES DAILY BEFORE A MEAL.     glucose blood test strip  Commonly known as:  ONE TOUCH ULTRA TEST  Use as instructed bid, has OneTouch Ultra Blue  dx: 250.02     hydrochlorothiazide 12.5 MG tablet  Commonly known as:  HYDRODIURIL  TAKE 1 TABLET (12.5 MG TOTAL) BY MOUTH DAILY.     lisinopril 5 MG tablet  Commonly known as:  PRINIVIL,ZESTRIL  Take 1 tablet (5 mg total) by mouth daily.     meloxicam 7.5 MG tablet  Commonly known as:  MOBIC  TAKE 1 TABLET BY MOUTH EVERY DAY     metFORMIN 1000 MG tablet  Commonly known as:  GLUCOPHAGE  Take 1 tablet (1,000 mg total) by mouth 2 (two) times daily with a meal.     omeprazole 20 MG capsule  Commonly known as:  PRILOSEC  TAKE ONE CAPSULE BY MOUTH EVERY DAY     ONE TOUCH ULTRA SYSTEM KIT W/DEVICE Kit  Pt needs OneTouch Ultra Blue glucometer dx 250.02     onetouch ultrasoft lancets  Use as instructed bid dx 250.02     oxyCODONE-acetaminophen 5-325 MG  tablet  Commonly known as:  PERCOCET/ROXICET  Take 0.5-1 tablets by mouth every 4 (four) hours as needed (pain).     tiotropium 18 MCG inhalation capsule  Commonly known as:  SPIRIVA  Place 1 capsule (18 mcg total) into inhaler and inhale daily.         SignedHosie Spangle 06/27/2015, 7:42 AM

## 2015-06-29 ENCOUNTER — Other Ambulatory Visit: Payer: Self-pay | Admitting: Internal Medicine

## 2015-07-11 ENCOUNTER — Other Ambulatory Visit: Payer: Medicare Other

## 2015-07-16 ENCOUNTER — Ambulatory Visit: Payer: Medicare Other | Admitting: Internal Medicine

## 2015-07-17 ENCOUNTER — Other Ambulatory Visit: Payer: Self-pay | Admitting: Internal Medicine

## 2015-07-17 NOTE — Telephone Encounter (Signed)
Pt last filled 03/12/15, Pt OV last OV 07/16/15. Please advise/tvw.

## 2015-07-18 NOTE — Telephone Encounter (Signed)
Incorrect:  Last OV was Oct 2016. She is due for follow up on DM ahs has appt in Feb  .  Refill ed

## 2015-08-05 ENCOUNTER — Ambulatory Visit (INDEPENDENT_AMBULATORY_CARE_PROVIDER_SITE_OTHER): Payer: Medicare HMO | Admitting: Family Medicine

## 2015-08-05 ENCOUNTER — Ambulatory Visit (INDEPENDENT_AMBULATORY_CARE_PROVIDER_SITE_OTHER)
Admission: RE | Admit: 2015-08-05 | Discharge: 2015-08-05 | Disposition: A | Discharge status: Home / Self Care | Payer: Medicare HMO | Source: Ambulatory Visit | Attending: Family Medicine | Admitting: Family Medicine

## 2015-08-05 ENCOUNTER — Encounter: Payer: Self-pay | Admitting: Family Medicine

## 2015-08-05 VITALS — BP 112/82 | HR 78 | Temp 98.5°F | Ht 64.0 in | Wt 167.0 lb

## 2015-08-05 DIAGNOSIS — R0602 Shortness of breath: Secondary | ICD-10-CM | POA: Diagnosis not present

## 2015-08-05 DIAGNOSIS — R05 Cough: Secondary | ICD-10-CM | POA: Diagnosis not present

## 2015-08-05 DIAGNOSIS — J441 Chronic obstructive pulmonary disease with (acute) exacerbation: Secondary | ICD-10-CM

## 2015-08-05 MED ORDER — MOXIFLOXACIN HCL 400 MG PO TABS: 400.0000 mg | ORAL_TABLET | Freq: Every day | ORAL | Status: DC

## 2015-08-05 MED ORDER — PREDNISONE 50 MG PO TABS: ORAL_TABLET | ORAL | Status: DC

## 2015-08-05 NOTE — Assessment & Plan Note (Signed)
Acute illness. Patient with mild increased bleeding today. Lung exam showed diffuse wheezing. Given fever, chest x-ray was obtained. Chest x-ray was independently reviewed. Interpretation: No acute infiltrate. Treating with prednisone and Avelox.

## 2015-08-05 NOTE — Progress Notes (Signed)
Subjective:  Patient ID: Maria Hinton, female    DOB: October 17, 1945  Age: 70 y.o. MRN: [id number]  CC: Cough, Fever, SOB  HPI:  70 year old female with COPD presents with the above symptoms.  Patient presents with complaints of productive cough, fever, and associated shortness of breath/wheezing for the past 2 weeks. Patient states her cough is productive of discolored sputum. Her fever has been as high as 102. She's been using her regular inhalers/COPD meds as prescribed. She states that she is using albuterol sparingly. No exacerbating or relieving factors. No other complaints.  Social Hx   Social History   Social History  . Marital Status: Widowed    Spouse Name: N/A  . Number of Children: N/A  . Years of Education: N/A   Social History Main Topics  . Smoking status: Former Smoker -- 1.50 packs/day for 20 years    Types: Cigarettes    Quit date: 02/04/2015  . Smokeless tobacco: Never Used     Comment: Pt quit 11/08/2014.Pt had quit smoking for 13 years before starting back in 2010  . Alcohol Use: No  . Drug Use: No  . Sexual Activity: No   Other Topics Concern  . None   Social History Narrative   Review of Systems  Constitutional: Positive for fever.  HENT: Positive for rhinorrhea.   Respiratory: Positive for cough and shortness of breath.   Neurological: Positive for headaches.   Objective:  BP 112/82 mmHg  Pulse 78  Temp(Src) 98.5 F (36.9 C) (Oral)  Ht 5\' 4"  (1.626 m)  Wt 167 lb (75.751 kg)  BMI 28.65 kg/m2  SpO2 95%  BP/Weight 08/05/2015 06/27/2015 [id number]  Systolic BP [id number] -  Diastolic BP 82 62 -  Wt. (Lbs) 167 - 168.8  BMI 28.65 - 28.96   Physical Exam  Constitutional: She is oriented to person, place, and time. She appears well-developed.  Appears dyspneic.  HENT:  Head: Normocephalic and atraumatic.  Mouth/Throat: Oropharynx is clear and moist.  Normal TM's bilaterally.  Cardiovascular: Normal rate and regular rhythm.   Pulmonary/Chest:    Increased work of breathing. Diffuse expiratory wheezing.  Neurological: She is alert and oriented to person, place, and time.  Vitals reviewed.  Lab Results  Component Value Date   WBC 11.7* 06/20/2015   HGB 14.1 06/20/2015   HCT 41.8 06/20/2015   PLT 294 06/20/2015   GLUCOSE 128* 06/20/2015   CHOL 134 04/09/2015   TRIG * 04/09/2015    420.0 Triglyceride is over 400; calculations on Lipids are invalid.   HDL 35.50* 04/09/2015   LDLDIRECT 51.0 04/09/2015   LDLCALC 70 01/16/2015   ALT 20 04/09/2015   AST 17 04/09/2015   NA 138 06/20/2015   K 4.5 06/20/2015   CL 102 06/20/2015   CREATININE 1.10* 06/20/2015   BUN 20 06/20/2015   CO2 24 06/20/2015   TSH 1.31 11/28/2013   INR 0.9 11/21/2012   HGBA1C 7.3* 06/20/2015   MICROALBUR 2.7* 01/03/2015    Assessment & Plan:   Problem List Items Addressed This Visit    COPD exacerbation ([place]) - Primary    Acute illness. Patient with mild increased bleeding today. Lung exam showed diffuse wheezing. Given fever, chest x-ray was obtained. Chest x-ray was independently reviewed. Interpretation: No acute infiltrate. Treating with prednisone and Avelox.      Relevant Medications   predniSONE (DELTASONE) 50 MG tablet   Other Relevant Orders   DG Chest 2 View (Completed)  Meds ordered this encounter  Medications  . predniSONE (DELTASONE) 50 MG tablet    Sig: 1 tablet daily x 5 days.    Dispense:  5 tablet    Refill:  0  . moxifloxacin (AVELOX) 400 MG tablet    Sig: Take 1 tablet (400 mg total) by mouth daily.    Dispense:  7 tablet    Refill:  0    Follow-up: PRN  [place]

## 2015-08-05 NOTE — Patient Instructions (Signed)
Take the antibiotic and prednisone as prescribed.  Follow up closely with Dr. Derrel Nip.  Go to the hospital if you worsen.  Take care  Dr. Lacinda Axon

## 2015-08-05 NOTE — Progress Notes (Signed)
Pre visit review using our clinic review tool, if applicable. No additional management support is needed unless otherwise documented below in the visit note. 

## 2015-08-13 ENCOUNTER — Other Ambulatory Visit (INDEPENDENT_AMBULATORY_CARE_PROVIDER_SITE_OTHER): Payer: Medicare HMO

## 2015-08-13 ENCOUNTER — Telehealth: Payer: Self-pay | Admitting: *Deleted

## 2015-08-13 DIAGNOSIS — I10 Essential (primary) hypertension: Secondary | ICD-10-CM | POA: Diagnosis not present

## 2015-08-13 DIAGNOSIS — E785 Hyperlipidemia, unspecified: Secondary | ICD-10-CM

## 2015-08-13 DIAGNOSIS — D72829 Elevated white blood cell count, unspecified: Secondary | ICD-10-CM

## 2015-08-13 DIAGNOSIS — E1121 Type 2 diabetes mellitus with diabetic nephropathy: Secondary | ICD-10-CM

## 2015-08-13 LAB — COMPREHENSIVE METABOLIC PANEL
ALT: 16 U/L (ref 0–35)
AST: 12 U/L (ref 0–37)
Albumin: 4.2 g/dL (ref 3.5–5.2)
Alkaline Phosphatase: 107 U/L (ref 39–117)
BUN: 19 mg/dL (ref 6–23)
CO2: 32 mEq/L (ref 19–32)
Calcium: 9.1 mg/dL (ref 8.4–10.5)
Chloride: 101 mEq/L (ref 96–112)
Creatinine, Ser: 1.07 mg/dL (ref 0.40–1.20)
GFR: 54.28 mL/min — ABNORMAL LOW (ref >60.00)
Glucose, Bld: 186 mg/dL — ABNORMAL HIGH (ref 70–99)
Potassium: 4.5 mEq/L (ref 3.5–5.1)
Sodium: 141 mEq/L (ref 135–145)
Total Bilirubin: 0.5 mg/dL (ref 0.2–1.2)
Total Protein: 6.6 g/dL (ref 6.0–8.3)

## 2015-08-13 LAB — LIPID PANEL
Cholesterol: 151 mg/dL (ref 0–200)
HDL: 44.3 mg/dL (ref >39.00)
NonHDL: 107.17
Total CHOL/HDL Ratio: 3
Triglycerides: 307 mg/dL — ABNORMAL HIGH (ref 0.0–149.0)
VLDL: 61.4 mg/dL — ABNORMAL HIGH (ref 0.0–40.0)

## 2015-08-13 LAB — LDL CHOLESTEROL, DIRECT: Direct LDL: 63 mg/dL

## 2015-08-13 NOTE — Telephone Encounter (Signed)
Labs and dx?  

## 2015-08-16 ENCOUNTER — Encounter: Payer: Self-pay | Admitting: Internal Medicine

## 2015-08-16 ENCOUNTER — Telehealth: Payer: Self-pay | Admitting: Internal Medicine

## 2015-08-16 ENCOUNTER — Ambulatory Visit (INDEPENDENT_AMBULATORY_CARE_PROVIDER_SITE_OTHER): Payer: Medicare HMO | Admitting: Internal Medicine

## 2015-08-16 VITALS — BP 104/70 | HR 68 | Temp 98.1°F | Resp 12 | Ht 64.0 in | Wt 165.8 lb

## 2015-08-16 DIAGNOSIS — Z9889 Other specified postprocedural states: Secondary | ICD-10-CM

## 2015-08-16 DIAGNOSIS — Z87891 Personal history of nicotine dependence: Secondary | ICD-10-CM

## 2015-08-16 DIAGNOSIS — J441 Chronic obstructive pulmonary disease with (acute) exacerbation: Secondary | ICD-10-CM

## 2015-08-16 DIAGNOSIS — I10 Essential (primary) hypertension: Secondary | ICD-10-CM | POA: Diagnosis not present

## 2015-08-16 MED ORDER — GABAPENTIN 100 MG PO CAPS: 100.0000 mg | ORAL_CAPSULE | Freq: Three times a day (TID) | ORAL | Status: DC

## 2015-08-16 NOTE — Telephone Encounter (Signed)
Dr.Tullo is the microalbumin/creatine labs the only thing you want to have pt get done. Pt is requesting labs, just want to make sure before I call pt back. Please advise, thanks

## 2015-08-16 NOTE — Progress Notes (Signed)
Pre-visit discussion using our clinic review tool. No additional management support is needed unless otherwise documented below in the visit note.  

## 2015-08-16 NOTE — Patient Instructions (Addendum)
We are starting a medication for  your left hand tingling called gabapentin  You can start it 3 times daily or just in the evening  Your last A1c was at the hospital in December.  We will repeat labs in 3 months   Start walking for 30 minutes daily.  Sleep Apnea  Sleep apnea is a sleep disorder characterized by abnormal pauses in breathing while you sleep. When your breathing pauses, the level of oxygen in your blood decreases. This causes you to move out of deep sleep and into light sleep. As a result, your quality of sleep is poor, and the system that carries your blood throughout your body (cardiovascular system) experiences stress. If sleep apnea remains untreated, the following conditions can develop:  High blood pressure (hypertension).  Coronary artery disease.  Inability to achieve or maintain an erection (impotence).  Impairment of your thought process (cognitive dysfunction). There are three types of sleep apnea: 1. Obstructive sleep apnea--Pauses in breathing during sleep because of a blocked airway. 2. Central sleep apnea--Pauses in breathing during sleep because the area of the brain that controls your breathing does not send the correct signals to the muscles that control breathing. 3. Mixed sleep apnea--A combination of both obstructive and central sleep apnea. RISK FACTORS The following risk factors can increase your risk of developing sleep apnea:  Being overweight.  Smoking.  Having narrow passages in your nose and throat.  Being of older age.  Being female.  Alcohol use.  Sedative and tranquilizer use.  Ethnicity. Among individuals younger than 35 years, African Americans are at increased risk of sleep apnea. SYMPTOMS   Difficulty staying asleep.  Daytime sleepiness and fatigue.  Loss of energy.  Irritability.  Loud, heavy snoring.  Morning headaches.  Trouble concentrating.  Forgetfulness.  Decreased interest in sex.  Unexplained  sleepiness. DIAGNOSIS  In order to diagnose sleep apnea, your caregiver will perform a physical examination. A sleep study done in the comfort of your own home may be appropriate if you are otherwise healthy. Your caregiver may also recommend that you spend the night in a sleep lab. In the sleep lab, several monitors record information about your heart, lungs, and brain while you sleep. Your leg and arm movements and blood oxygen level are also recorded. TREATMENT The following actions may help to resolve mild sleep apnea:  Sleeping on your side.   Using a decongestant if you have nasal congestion.   Avoiding the use of depressants, including alcohol, sedatives, and narcotics.   Losing weight and modifying your diet if you are overweight. There also are devices and treatments to help open your airway:  Oral appliances. These are custom-made mouthpieces that shift your lower jaw forward and slightly open your bite. This opens your airway.  Devices that create positive airway pressure. This positive pressure "splints" your airway open to help you breathe better during sleep. The following devices create positive airway pressure:  Continuous positive airway pressure (CPAP) device. The CPAP device creates a continuous level of air pressure with an air pump. The air is delivered to your airway through a mask while you sleep. This continuous pressure keeps your airway open.  Nasal expiratory positive airway pressure (EPAP) device. The EPAP device creates positive air pressure as you exhale. The device consists of single-use valves, which are inserted into each nostril and held in place by adhesive. The valves create very little resistance when you inhale but create much more resistance when you exhale. That increased  resistance creates the positive airway pressure. This positive pressure while you exhale keeps your airway open, making it easier to breath when you inhale again.  Bilevel positive  airway pressure (BPAP) device. The BPAP device is used mainly in patients with central sleep apnea. This device is similar to the CPAP device because it also uses an air pump to deliver continuous air pressure through a mask. However, with the BPAP machine, the pressure is set at two different levels. The pressure when you exhale is lower than the pressure when you inhale.  Surgery. Typically, surgery is only done if you cannot comply with less invasive treatments or if the less invasive treatments do not improve your condition. Surgery involves removing excess tissue in your airway to create a wider passage way.   This information is not intended to replace advice given to you by your health care provider. Make sure you discuss any questions you have with your health care provider.   Document Released: 06/12/2002 Document Revised: 07/13/2014 Document Reviewed: 10/29/2011 Elsevier Interactive Patient Education [employer].

## 2015-08-16 NOTE — Telephone Encounter (Signed)
Pt would like to get lab work before appt. Need orders please and thank you! Call pt @ [phone].

## 2015-08-16 NOTE — Progress Notes (Signed)
Subjective:  Patient ID: Maria Hinton, female    DOB: 1946/06/24  Age: 70 y.o. MRN: [id number]  CC: The primary encounter diagnosis was History of lumbar laminectomy for spinal cord decompression. Diagnoses of History of tobacco abuse, HTN (hypertension), benign, and COPD exacerbation ([place]) were also pertinent to this visit.  HPI Maria Hinton presents for 3 month follow up on diabetes.    Sugars have been unchecked due to loss of glucometer during a clean up of her room.  She underwent a lumbar laminectomy on Dec 21 by Jovita Gamma with complete resolution of her sciatica. Still wearing back brace a few hours daily during certain activities including driving .  Not vacuuming,  Not lifting.   Has not resumed a walking program  Yet.  Had a COPD exacerbation 3 weeks ago .  No longer wheezing . Quit smoking 6 months ago, but  lives with 3 smokers . Wakes up every mornign with a headache,  [place]alaise . feels lethargic and off balance since her COPD exacerbation   She has had no prior evaluation with  sleep study.  Getting ready to move out of her son's home to be on her own,  and finances are tight. New Policy with [employer] in January . Sleep study deferred by patient.    Lab Results  Component Value Date   HGBA1C 7.3* 06/20/2015   Lab Results  Component Value Date   CHOL 151 08/13/2015   HDL 44.30 08/13/2015   LDLCALC 70 01/16/2015   LDLDIRECT 63.0 08/13/2015   TRIG 307.0* 08/13/2015   CHOLHDL 3 08/13/2015   Lab Results  Component Value Date   MICROALBUR 2.7* 01/03/2015     Patient has no complaints today.  Patient is following a low glycemic index diet and taking all prescribed medications regularly without side effects.  Fasting sugars have been under less than 140 most of the time and post prandials have been under 160 except on rare occasions. Patient is exercising about 3 times per week and intentionally trying to lose weight .  Patient has had an eye exam in the last 12  months and checks feet regularly for signs of infection.  Patient does not walk barefoot outside,  And denies an numbness tingling or burning in feet. Patient is up to date on all recommended vaccinations  Outpatient Prescriptions Prior to Visit  Medication Sig Dispense Refill  . albuterol (PROVENTIL HFA;VENTOLIN HFA) 108 (90 BASE) MCG/ACT inhaler Inhale 2 puffs into the lungs every 6 (six) hours as needed for wheezing. 6.7 g 11  . ALPRAZolam (XANAX) 0.25 MG tablet TAKE 1 TABLET BY MOUTH AS NEEDED FOR SLEEP 30 tablet 3  . aspirin EC 81 MG tablet Take 81 mg by mouth daily.    Marland Kitchen atenolol (TENORMIN) 50 MG tablet Take 1 tablet (50 mg total) by mouth 2 (two) times daily. 180 tablet 1  . atorvastatin (LIPITOR) 40 MG tablet TAKE 1 TABLET BY MOUTH DAILY AT 6 PM. 90 tablet 1  . Blood Glucose Monitoring Suppl (ONE TOUCH ULTRA SYSTEM KIT) W/DEVICE KIT Pt needs OneTouch Ultra Blue glucometer dx 250.02 1 each 0  . diazepam (VALIUM) 10 MG tablet Take 1 tablet (10 mg total) by mouth every 12 (twelve) hours as needed (muscle spasm). (Patient taking differently: Take 10 mg by mouth every 12 (twelve) hours as needed. For muscle spasms) 30 tablet 1  . Fluticasone-Salmeterol (ADVAIR DISKUS) 500-50 MCG/DOSE AEPB Inhale 1 puff into the lungs 2 (two) times daily. (  Patient taking differently: Inhale 1 puff into the lungs daily. ) 60 each 0  . glipiZIDE (GLUCOTROL) 10 MG tablet TAKE 1 TABLET BY MOUTH 2 TIMES DAILY BEFORE A MEAL. 60 tablet 5  . glucose blood (ONE TOUCH ULTRA TEST) test strip Use as instructed bid, has OneTouch Ultra Blue  dx: 250.02 100 each 3  . hydrochlorothiazide (HYDRODIURIL) 12.5 MG tablet TAKE 1 TABLET (12.5 MG TOTAL) BY MOUTH DAILY. 90 tablet 1  . Lancets (ONETOUCH ULTRASOFT) lancets Use as instructed bid dx 250.02 100 each 3  . lisinopril (PRINIVIL,ZESTRIL) 5 MG tablet Take 1 tablet (5 mg total) by mouth daily. 90 tablet 3  . meloxicam (MOBIC) 7.5 MG tablet TAKE 1 TABLET BY MOUTH EVERY DAY 30 tablet  5  . metFORMIN (GLUCOPHAGE) 1000 MG tablet Take 1 tablet (1,000 mg total) by mouth 2 (two) times daily with a meal. 180 tablet 3  . omeprazole (PRILOSEC) 20 MG capsule TAKE ONE CAPSULE BY MOUTH EVERY DAY 90 capsule 1  . tiotropium (SPIRIVA) 18 MCG inhalation capsule Place 1 capsule (18 mcg total) into inhaler and inhale daily. (Patient taking differently: Place 18 mcg into inhaler and inhale 2 (two) times daily. ) 30 capsule 12  . moxifloxacin (AVELOX) 400 MG tablet Take 1 tablet (400 mg total) by mouth daily. (Patient not taking: Reported on 08/16/2015) 7 tablet 0  . predniSONE (DELTASONE) 50 MG tablet 1 tablet daily x 5 days. (Patient not taking: Reported on 08/16/2015) 5 tablet 0  . oxyCODONE-acetaminophen (PERCOCET/ROXICET) 5-325 MG tablet Take 0.5-1 tablets by mouth every 4 (four) hours as needed (pain). (Patient not taking: Reported on 08/05/2015) 60 tablet 0   No facility-administered medications prior to visit.    Review of Systems;  Patient denies headache, fevers, malaise, unintentional weight loss, skin rash, eye pain, sinus congestion and sinus pain, sore throat, dysphagia,  hemoptysis , cough, dyspnea, wheezing, chest pain, palpitations, orthopnea, edema, abdominal pain, nausea, melena, diarrhea, constipation, flank pain, dysuria, hematuria, urinary  Frequency, nocturia, numbness, tingling, seizures,  Focal weakness, Loss of consciousness,  Tremor, insomnia, depression, anxiety, and suicidal ideation.      Objective:  BP 104/70 mmHg  Pulse 68  Temp(Src) 98.1 F (36.7 C) (Oral)  Resp 12  Ht 5' 4" (1.626 m)  Wt 165 lb 12 oz (75.184 kg)  BMI 28.44 kg/m2  SpO2 98%  BP Readings from Last 3 Encounters:  08/16/15 104/70  08/05/15 112/82  06/27/15 104/62    Wt Readings from Last 3 Encounters:  08/16/15 165 lb 12 oz (75.184 kg)  08/05/15 167 lb (75.751 kg)  06/26/15 168 lb 12.8 oz (76.567 kg)    General appearance: alert, cooperative and appears stated age Ears: normal TM's  and external ear canals both ears Throat: lips, mucosa, and tongue normal; teeth and gums normal Neck: no adenopathy, no carotid bruit, supple, symmetrical, trachea midline and thyroid not enlarged, symmetric, no tenderness/mass/nodules Back: symmetric, no curvature. ROM normal. No CVA tenderness. Lungs: clear to auscultation bilaterally Heart: regular rate and rhythm, S1, S2 normal, no murmur, click, rub or gallop Abdomen: soft, non-tender; bowel sounds normal; no masses,  no organomegaly Pulses: 2+ and symmetric Skin: Skin color, texture, turgor normal. No rashes or lesions Lymph nodes: Cervical, supraclavicular, and axillary nodes normal.  Lab Results  Component Value Date   HGBA1C 7.3* 06/20/2015   HGBA1C 7.4* 04/09/2015   HGBA1C 9.2* 01/03/2015    Lab Results  Component Value Date   CREATININE 1.07 08/13/2015   CREATININE  1.10* 06/20/2015   CREATININE 0.95 04/09/2015    Lab Results  Component Value Date   WBC 11.7* 06/20/2015   HGB 14.1 06/20/2015   HCT 41.8 06/20/2015   PLT 294 06/20/2015   GLUCOSE 186* 08/13/2015   CHOL 151 08/13/2015   TRIG 307.0* 08/13/2015   HDL 44.30 08/13/2015   LDLDIRECT 63.0 08/13/2015   LDLCALC 70 01/16/2015   ALT 16 08/13/2015   AST 12 08/13/2015   NA 141 08/13/2015   K 4.5 08/13/2015   CL 101 08/13/2015   CREATININE 1.07 08/13/2015   BUN 19 08/13/2015   CO2 32 08/13/2015   TSH 1.31 11/28/2013   INR 0.9 11/21/2012   HGBA1C 7.3* 06/20/2015   MICROALBUR 2.7* 01/03/2015    Dg Chest 2 View  08/05/2015  CLINICAL DATA:  Cough and shortness of breath for the past 2 weeks, history of [place] OPD, former smoker. EXAM: CHEST  2 VIEW COMPARISON:  PA and lateral chest x-ray of Nov 21, 2012 FINDINGS: The lungs are mildly hyperinflated with hemidiaphragm flattening. There is no focal infiltrate. There is no pleural effusion. The heart and pulmonary vascularity are normal. The mediastinum is normal in width. There is surgical absence of the  posterior aspects of the left eighth and ninth ribs. There is wire suture present over the left lower lateral hemi thorax. There is multilevel degenerative disc disease of the thoracic spine. IMPRESSION: COPD. There is no pneumonia, CHF, nor other acute cardiopulmonary disease. Electronically Signed   By: David  Martinique M.D.   On: 08/05/2015 16:34    Assessment & Plan:   Problem List Items Addressed This Visit    HTN (hypertension), benign    Well controlled on current regimen. Renal function stable, no changes today.  Lab Results  Component Value Date   CREATININE 1.07 08/13/2015   Lab Results  Component Value Date   NA 141 08/13/2015   K 4.5 08/13/2015   CL 101 08/13/2015   CO2 32 08/13/2015         History of tobacco abuse    Smoking cessation instruction/counseling given:  commended patient for quitting and reviewed strategies for preventing relapses      COPD exacerbation ([place])    Resolved clinically.  Chest x ray was done Jan 30th no infiltrates       History of lumbar laminectomy for spinal cord decompression - Primary    Her back pain has completely resolved since her decompression of L3-L5 by Dr Sherwood Gambler Dec 2015.  Continue use of back brace as directed and resume walking program.          I have discontinued Ms. [place] oxyCODONE-acetaminophen. I am also having her start on gabapentin. Additionally, I am having her maintain her glucose blood, ONE TOUCH ULTRA SYSTEM KIT, onetouch ultrasoft, metFORMIN, tiotropium, albuterol, diazepam, lisinopril, hydrochlorothiazide, Fluticasone-Salmeterol, omeprazole, atorvastatin, atenolol, glipiZIDE, aspirin EC, meloxicam, ALPRAZolam, predniSONE, and moxifloxacin.  Meds ordered this encounter  Medications  . gabapentin (NEURONTIN) 100 MG capsule    Sig: Take 1 capsule (100 mg total) by mouth 3 (three) times daily.    Dispense:  90 capsule    Refill:  3    Medications Discontinued During This Encounter  Medication Reason    . oxyCODONE-acetaminophen (PERCOCET/ROXICET) 5-325 MG tablet Patient Preference    Follow-up: Return in about 3 months (around 11/13/2015) for follow up diabetes.   Crecencio Mc, MD

## 2015-08-16 NOTE — Telephone Encounter (Signed)
appt made for 10/28/15 @ 9:15

## 2015-08-16 NOTE — Telephone Encounter (Signed)
Yes that is all I ordered.  If you look in the chart you will see that she had labs done 2 days ago.

## 2015-08-17 ENCOUNTER — Other Ambulatory Visit: Payer: Self-pay | Admitting: Internal Medicine

## 2015-08-18 ENCOUNTER — Encounter: Payer: Self-pay | Admitting: Internal Medicine

## 2015-08-18 DIAGNOSIS — Z9889 Other specified postprocedural states: Secondary | ICD-10-CM | POA: Insufficient documentation

## 2015-08-18 NOTE — Assessment & Plan Note (Signed)
Her back pain has completely resolved since her decompression of L3-L5 by Dr Sherwood Gambler Dec 2015.  Continue use of back brace as directed and resume walking program.

## 2015-08-18 NOTE — Assessment & Plan Note (Signed)
Well controlled on current regimen. Renal function stable, no changes today.  Lab Results  Component Value Date   CREATININE 1.07 08/13/2015   Lab Results  Component Value Date   NA 141 08/13/2015   K 4.5 08/13/2015   CL 101 08/13/2015   CO2 32 08/13/2015

## 2015-08-18 NOTE — Assessment & Plan Note (Signed)
Improving control with low GI diet and exercise .  Marland KitchenPatient is referred for  eye exam and foot exam was done today.  There is  no proteinuria on prior micro urinalysis .  Fasting lipids were reviewed and hypertriglyceridemia discussed.  Atorvastatin was started and is thus far tolerated    Lab Results  Component Value Date   HGBA1C 7.3* 06/20/2015

## 2015-08-18 NOTE — Assessment & Plan Note (Signed)
Resolved clinically.  Chest x ray was done Jan 30th no infiltrates

## 2015-08-18 NOTE — Assessment & Plan Note (Signed)
Smoking cessation instruction/counseling given:  commended patient for quitting and reviewed strategies for preventing relapses 

## 2015-08-28 ENCOUNTER — Other Ambulatory Visit: Payer: Self-pay | Admitting: Internal Medicine

## 2015-09-24 ENCOUNTER — Encounter: Payer: Self-pay | Admitting: Internal Medicine

## 2015-09-24 ENCOUNTER — Ambulatory Visit (INDEPENDENT_AMBULATORY_CARE_PROVIDER_SITE_OTHER): Payer: Medicare HMO | Admitting: Internal Medicine

## 2015-09-24 VITALS — BP 114/78 | HR 67 | Ht 64.0 in | Wt 167.0 lb

## 2015-09-24 DIAGNOSIS — J449 Chronic obstructive pulmonary disease, unspecified: Secondary | ICD-10-CM

## 2015-09-24 DIAGNOSIS — J4489 Other specified chronic obstructive pulmonary disease: Secondary | ICD-10-CM

## 2015-09-24 NOTE — Addendum Note (Signed)
Addended by: Maryanna Shape A on: 09/24/2015 10:27 AM   Modules accepted: Orders

## 2015-09-24 NOTE — Progress Notes (Signed)
[facility] Consultation      MRN# [id number] Maria Hinton [birth year]   CC: Chief Complaint  Patient presents with  . Follow-up    breathing doing well since last OV; no complaints      Brief History: Initial visit on 02/21/2014 Patient is a pleasant 70 year old female presenting in the clinic today for reestablish care and management of her asthma. History is per the patient.  Patient states that she was diagnosed with asthma in 1995 she had a bad bout of bronchitis, during that time she was placed on prednisone for her bronchitis where she continued to have cough and wheezing. She stated she saw a pulmonologist at that time and was told based on a PFT and symptoms that she had asthma she was managed briefly with Advair for a few months and albuterol rescue inhaler; she stated after about 4 months she was weaned off the Advair and has only been on rescue inhaler. She states that currently she has occasional wheezing and sometimes shortness of breath with exertion. He states that she can walk a mile before having any major dyspnea; off note she had hip replacement done last year and has not been able to walk like she used to. In the last year she has had no ER visits, no urgent care visits, no use of prednisone, no intubation, no antibiotics for asthma. She has never been intubated for an asthma exacerbation. She states that she will use her rescue inhaler maybe 1-2 times a 6 month period. Her triggers are wood filings and saw dust for asthma. She previously smoked one pack per day for 36 years, in between that she quit for about 13 years, but resumed smoking 5 years ago when her husband passed away; currently she smokes 5-6 cigarettes per day. Patient admits she has 2 dogs at home that stay inside with her. She admits to morning symptoms of smoker's cough (described as clear thick sputum production), mild wheeze, and mild shortness of breath.  Patient stated that when she  was about 70 years old she had a surgery done a left chest wall for abnormal veins, stated she had 3 ribs taken out at that time. -At this visit and further workup a pulmonary function testing, repeat CAT scan of chest, and followup.   [name] 04/05/15  Patient presents today for followup of her CT chest and pulmonary function testing results. She states that she's currently smoking 1.5 packs per day, which is an increase since her last visit. She states that she's had some recent stressors, grandson recent surgery for a brain tumor, her mother was recently ill. Patient states that she still has some mild to moderate shortness of breath and dyspnea on exertion, but has not been using albuterol (she does not feel that she needs). Patient still endorses a morning cough with mild thick whitish sputum. Today her CAT score is 10, and her mMRC is 1 Plan - tobacco cessation, start spiriva.     [name] 12/18/14 Patient presents today for an acute care visit for acute sick visit. Patient stated that in the middle of May she went to the beach shortly after which she had a upper history tract infection where she was seen at a local urgent care had a chest x-ray done that showed scar tissue, her oxygen level during that visit was noted to be at 81%, she was given nebulizers 2 which gradually improved her oxygen level. Also after that urgent care visit she was  given doxycycline and cough pills for her symptoms. Since her last visit she's also quit smoking, but she is exposed significantly from secondhand smoke from other roommates. She's currently on Spiriva which was doing fairly well for her. She states that she's now having cough in the morning and congestion, she states that her recovery from May has been very slow and she is still fatigued and still has moderate amount of cough. Patient states that she's also using albuterol 3-4 times per day.  Plan Given her recent symptoms, I believe that step up therapy is  needed at this time, will add Advair 500/50 to the patient's current COPD regiment. She is advised to take 1 puff twice daily, and gargle and rinse after each use. Continue with Spiriva. Prednisone 20 mg daily 5 days. Avoid any secondhand smoke exposure as much as possible   [name] 03/22/15 Patient presents today for follow-up visit of her COPD. Patient states since Friday she has had increased cough, sputum production (thick white to mild yellowish), shortness of breath, runny eyes and watery nose. She states that her family members who live with her actively smoking in the house and is triggering her COPD. She is also stated that she is in the "donut hole" for her insurance and is having a difficult time obtaining medications. She does have Advair currently that will last her about another 4 weeks. Today, she states prior to her recent COPD acting up she was doing fairly well, has only use albuterol once on Saturday. Plan - Advair, Z-Pak, prednisone taper, diet and exercise.  Events since last clinic visit: She presents today for follow-up visit of her COPD. Since her last visit she has completely stopped smoking, she is currently on Advair and Spiriva, which is providing good clinical benefit for her. She has lumbar surgery in December, no respiratory issues after the surgery She states that her family members, 3, smoke indoors, and this sometimes aggravates her breathing pattern. She denies any cough, worsening shortness of breath, sputum production, fevers. She states in January she was diagnosed with upper story tract infection, she got a course of prednisone and Avelox for this, and has been doing well. Patient states she may have used albuterol inhaler once in the last 3 months      Medication:   Current Outpatient Rx  Name  Route  Sig  Dispense  Refill  . albuterol (PROVENTIL HFA;VENTOLIN HFA) 108 (90 BASE) MCG/ACT inhaler   Inhalation   Inhale 2 puffs into the lungs every 6  (six) hours as needed for wheezing.   6.7 g   11   . ALPRAZolam (XANAX) 0.25 MG tablet      TAKE 1 TABLET BY MOUTH AS NEEDED FOR SLEEP   30 tablet   3     .   . aspirin EC 81 MG tablet   Oral   Take 81 mg by mouth daily.         Marland Kitchen atenolol (TENORMIN) 50 MG tablet   Oral   Take 1 tablet (50 mg total) by mouth 2 (two) times daily.   180 tablet   1   . atorvastatin (LIPITOR) 40 MG tablet      TAKE 1 TABLET BY MOUTH DAILY AT 6 PM.   90 tablet   3   . Blood Glucose Monitoring Suppl (ONE TOUCH ULTRA SYSTEM KIT) W/DEVICE KIT      Pt needs OneTouch Ultra Blue glucometer dx 250.02   1 each  0   . diazepam (VALIUM) 10 MG tablet   Oral   Take 1 tablet (10 mg total) by mouth every 12 (twelve) hours as needed (muscle spasm). Patient taking differently: Take 10 mg by mouth every 12 (twelve) hours as needed. For muscle spasms   30 tablet   1   . Fluticasone-Salmeterol (ADVAIR DISKUS) 500-50 MCG/DOSE AEPB   Inhalation   Inhale 1 puff into the lungs 2 (two) times daily. Patient taking differently: Inhale 1 puff into the lungs daily.    60 each   0   . gabapentin (NEURONTIN) 100 MG capsule   Oral   Take 1 capsule (100 mg total) by mouth 3 (three) times daily.   90 capsule   3   . glipiZIDE (GLUCOTROL) 10 MG tablet      TAKE 1 TABLET BY MOUTH 2 TIMES DAILY BEFORE A MEAL.   60 tablet   5   . glucose blood (ONE TOUCH ULTRA TEST) test strip      Use as instructed bid, has OneTouch Ultra Blue  dx: 250.02   100 each   3   . hydrochlorothiazide (HYDRODIURIL) 12.5 MG tablet      TAKE 1 TABLET BY MOUTH DAILY.   90 tablet   1   . Lancets (ONETOUCH ULTRASOFT) lancets      Use as instructed bid dx 250.02   100 each   3   . lisinopril (PRINIVIL,ZESTRIL) 5 MG tablet   Oral   Take 1 tablet (5 mg total) by mouth daily.   90 tablet   3   . meloxicam (MOBIC) 7.5 MG tablet      TAKE 1 TABLET BY MOUTH EVERY DAY   30 tablet   5   . metFORMIN (GLUCOPHAGE) 1000 MG  tablet   Oral   Take 1 tablet (1,000 mg total) by mouth 2 (two) times daily with a meal.   180 tablet   3   . moxifloxacin (AVELOX) 400 MG tablet   Oral   Take 1 tablet (400 mg total) by mouth daily.   7 tablet   0   . omeprazole (PRILOSEC) 20 MG capsule      TAKE ONE CAPSULE BY MOUTH EVERY DAY   90 capsule   1   . tiotropium (SPIRIVA) 18 MCG inhalation capsule   Inhalation   Place 1 capsule (18 mcg total) into inhaler and inhale daily. Patient taking differently: Place 18 mcg into inhaler and inhale 2 (two) times daily.    30 capsule   12      Review of Systems  Constitutional: Negative for fever and chills.  HENT: Negative for hearing loss.   Eyes: Negative.  Negative for pain.  Respiratory: Negative for cough, sputum production, shortness of breath and wheezing.   Cardiovascular: Negative for chest pain and leg swelling.  Gastrointestinal: Negative for heartburn, vomiting and abdominal pain.  Genitourinary: Negative.   Musculoskeletal: Negative for myalgias and neck pain.  Skin: Negative for rash.  Neurological: Negative.  Negative for headaches.  Endo/Heme/Allergies: Negative.   Psychiatric/Behavioral: Negative.       Allergies:  Vicodin  Physical Examination:  VS: BP 114/78 mmHg  Pulse 67  Ht 5' 4" (1.626 m)  Wt 167 lb (75.751 kg)  BMI 28.65 kg/m2  SpO2 98%  General Appearance: No distress  HEENT: PERRLA, no ptosis, no other lesions noticed Pulmonary:good airway entry, no wheezes Cardiovascular:  Normal S1,S2.  No m/r/g.     Abdomen:Exam:  Benign, Soft, non-tender, No masses  Skin:   warm, no rashes, no ecchymosis  Extremities: normal, no cyanosis, clubbing, warm with normal capillary refill.       Assessment and Plan:69 yo F with COPD Stage B, seen for follow up visit, now with AECOPD COPD type B Patient with history of COPD B, Moderate airflow limitation (GOLD STAGE I) today AECOP, I believe that chronic exposure to secondhand smoke is causing  copd exacerbations. She is planning to move out and get her own apartment in May 2017, hopefully this will also improving her respiratory status.  Currently on Advair and spiriva, and doing well.   Plan Continue with Spiriva and Advair Avoid any secondhand smoke exposure as much as possible            Updated Medication List Outpatient Encounter Prescriptions as of 09/24/2015  Medication Sig  . albuterol (PROVENTIL HFA;VENTOLIN HFA) 108 (90 BASE) MCG/ACT inhaler Inhale 2 puffs into the lungs every 6 (six) hours as needed for wheezing.  Marland Kitchen ALPRAZolam (XANAX) 0.25 MG tablet TAKE 1 TABLET BY MOUTH AS NEEDED FOR SLEEP  . aspirin EC 81 MG tablet Take 81 mg by mouth daily.  Marland Kitchen atenolol (TENORMIN) 50 MG tablet Take 1 tablet (50 mg total) by mouth 2 (two) times daily.  Marland Kitchen atorvastatin (LIPITOR) 40 MG tablet TAKE 1 TABLET BY MOUTH DAILY AT 6 PM.  . Blood Glucose Monitoring Suppl (ONE TOUCH ULTRA SYSTEM KIT) W/DEVICE KIT Pt needs OneTouch Ultra Blue glucometer dx 250.02  . diazepam (VALIUM) 10 MG tablet Take 1 tablet (10 mg total) by mouth every 12 (twelve) hours as needed (muscle spasm). (Patient taking differently: Take 10 mg by mouth every 12 (twelve) hours as needed. For muscle spasms)  . Fluticasone-Salmeterol (ADVAIR DISKUS) 500-50 MCG/DOSE AEPB Inhale 1 puff into the lungs 2 (two) times daily. (Patient taking differently: Inhale 1 puff into the lungs daily. )  . gabapentin (NEURONTIN) 100 MG capsule Take 1 capsule (100 mg total) by mouth 3 (three) times daily.  Marland Kitchen glipiZIDE (GLUCOTROL) 10 MG tablet TAKE 1 TABLET BY MOUTH 2 TIMES DAILY BEFORE A MEAL.  Marland Kitchen glucose blood (ONE TOUCH ULTRA TEST) test strip Use as instructed bid, has OneTouch Ultra Blue  dx: 250.02  . hydrochlorothiazide (HYDRODIURIL) 12.5 MG tablet TAKE 1 TABLET BY MOUTH DAILY.  Marland Kitchen Lancets (ONETOUCH ULTRASOFT) lancets Use as instructed bid dx 250.02  . lisinopril (PRINIVIL,ZESTRIL) 5 MG tablet Take 1 tablet (5 mg total) by mouth  daily.  . meloxicam (MOBIC) 7.5 MG tablet TAKE 1 TABLET BY MOUTH EVERY DAY  . metFORMIN (GLUCOPHAGE) 1000 MG tablet Take 1 tablet (1,000 mg total) by mouth 2 (two) times daily with a meal.  . moxifloxacin (AVELOX) 400 MG tablet Take 1 tablet (400 mg total) by mouth daily.  Marland Kitchen omeprazole (PRILOSEC) 20 MG capsule TAKE ONE CAPSULE BY MOUTH EVERY DAY  . tiotropium (SPIRIVA) 18 MCG inhalation capsule Place 1 capsule (18 mcg total) into inhaler and inhale daily. (Patient taking differently: Place 18 mcg into inhaler and inhale 2 (two) times daily. )  . [DISCONTINUED] predniSONE (DELTASONE) 50 MG tablet 1 tablet daily x 5 days. (Patient not taking: Reported on 08/16/2015)   No facility-administered encounter medications on file as of 09/24/2015.    Orders for this visit: No orders of the defined types were placed in this encounter.    Thank  you for the visitation and for allowing  [facility] to assist in the care  of your patient. Our recommendations are noted above.  Please contact us if we can be of further service.  Vilinda Boehringer, MD [facility] Office Number: [phone]

## 2015-09-24 NOTE — Patient Instructions (Signed)
Follow up with Dr. Stevenson Clinch in: 85months - cont with current inhalers - PFTs and 45mwt prior to follow up visit

## 2015-09-24 NOTE — Assessment & Plan Note (Signed)
Patient with history of COPD B, Moderate airflow limitation (GOLD STAGE I) today AECOP, I believe that chronic exposure to secondhand smoke is causing copd exacerbations. She is planning to move out and get her own apartment in May 2017, hopefully this will also improving her respiratory status.  Currently on Advair and spiriva, and doing well.   Plan Continue with Spiriva and Advair Avoid any secondhand smoke exposure as much as possible

## 2015-09-25 ENCOUNTER — Other Ambulatory Visit: Payer: Self-pay | Admitting: Internal Medicine

## 2015-10-28 ENCOUNTER — Other Ambulatory Visit: Payer: Medicare HMO

## 2015-10-29 ENCOUNTER — Telehealth: Payer: Self-pay | Admitting: *Deleted

## 2015-10-29 ENCOUNTER — Other Ambulatory Visit (INDEPENDENT_AMBULATORY_CARE_PROVIDER_SITE_OTHER): Payer: Medicare HMO

## 2015-10-29 DIAGNOSIS — E119 Type 2 diabetes mellitus without complications: Secondary | ICD-10-CM

## 2015-10-29 DIAGNOSIS — E785 Hyperlipidemia, unspecified: Secondary | ICD-10-CM

## 2015-10-29 LAB — LIPID PANEL
Cholesterol: 246 mg/dL — ABNORMAL HIGH (ref 0–200)
HDL: 47 mg/dL (ref >39.00)
NonHDL: 199.08
Total CHOL/HDL Ratio: 5
Triglycerides: 261 mg/dL — ABNORMAL HIGH (ref 0.0–149.0)
VLDL: 52.2 mg/dL — ABNORMAL HIGH (ref 0.0–40.0)

## 2015-10-29 LAB — LDL CHOLESTEROL, DIRECT: Direct LDL: 130 mg/dL

## 2015-10-29 LAB — COMPREHENSIVE METABOLIC PANEL
ALT: 20 U/L (ref 0–35)
AST: 17 U/L (ref 0–37)
Albumin: 4.2 g/dL (ref 3.5–5.2)
Alkaline Phosphatase: 78 U/L (ref 39–117)
BUN: 18 mg/dL (ref 6–23)
CO2: 31 mEq/L (ref 19–32)
Calcium: 9 mg/dL (ref 8.4–10.5)
Chloride: 101 mEq/L (ref 96–112)
Creatinine, Ser: 0.97 mg/dL (ref 0.40–1.20)
GFR: 60.75 mL/min (ref >60.00)
Glucose, Bld: 151 mg/dL — ABNORMAL HIGH (ref 70–99)
Potassium: 4.5 mEq/L (ref 3.5–5.1)
Sodium: 141 mEq/L (ref 135–145)
Total Bilirubin: 0.4 mg/dL (ref 0.2–1.2)
Total Protein: 6.6 g/dL (ref 6.0–8.3)

## 2015-10-29 LAB — MICROALBUMIN / CREATININE URINE RATIO
Creatinine,U: 183.1 mg/dL
Microalb Creat Ratio: 0.8 mg/g (ref 0.0–30.0)
Microalb, Ur: 1.5 mg/dL (ref 0.0–1.9)

## 2015-10-29 LAB — HEMOGLOBIN A1C: Hgb A1c MFr Bld: 7.2 % — ABNORMAL HIGH (ref 4.6–6.5)

## 2015-10-29 NOTE — Telephone Encounter (Signed)
Labs and dx?  

## 2015-11-01 ENCOUNTER — Encounter: Payer: Self-pay | Admitting: Internal Medicine

## 2015-11-13 ENCOUNTER — Ambulatory Visit: Payer: Medicare HMO | Admitting: Internal Medicine

## 2015-11-20 ENCOUNTER — Ambulatory Visit: Payer: Medicare HMO | Admitting: Internal Medicine

## 2015-11-20 ENCOUNTER — Other Ambulatory Visit: Payer: Self-pay | Admitting: Internal Medicine

## 2015-11-24 ENCOUNTER — Other Ambulatory Visit: Payer: Self-pay | Admitting: Internal Medicine

## 2015-11-25 ENCOUNTER — Other Ambulatory Visit: Payer: Self-pay | Admitting: Internal Medicine

## 2015-12-06 DIAGNOSIS — M1611 Unilateral primary osteoarthritis, right hip: Secondary | ICD-10-CM | POA: Diagnosis not present

## 2015-12-06 DIAGNOSIS — M25551 Pain in right hip: Secondary | ICD-10-CM | POA: Diagnosis not present

## 2015-12-10 ENCOUNTER — Encounter: Payer: Self-pay | Admitting: Internal Medicine

## 2015-12-10 ENCOUNTER — Ambulatory Visit (INDEPENDENT_AMBULATORY_CARE_PROVIDER_SITE_OTHER): Payer: Medicare HMO | Admitting: Internal Medicine

## 2015-12-10 VITALS — BP 132/72 | HR 71 | Temp 98.1°F | Resp 12 | Ht 64.0 in | Wt 160.5 lb

## 2015-12-10 DIAGNOSIS — I10 Essential (primary) hypertension: Secondary | ICD-10-CM

## 2015-12-10 DIAGNOSIS — F5105 Insomnia due to other mental disorder: Secondary | ICD-10-CM

## 2015-12-10 DIAGNOSIS — F419 Anxiety disorder, unspecified: Secondary | ICD-10-CM

## 2015-12-10 DIAGNOSIS — Z1239 Encounter for other screening for malignant neoplasm of breast: Secondary | ICD-10-CM | POA: Diagnosis not present

## 2015-12-10 DIAGNOSIS — N181 Chronic kidney disease, stage 1: Secondary | ICD-10-CM

## 2015-12-10 DIAGNOSIS — E1122 Type 2 diabetes mellitus with diabetic chronic kidney disease: Secondary | ICD-10-CM | POA: Diagnosis not present

## 2015-12-10 DIAGNOSIS — E785 Hyperlipidemia, unspecified: Secondary | ICD-10-CM

## 2015-12-10 DIAGNOSIS — M25551 Pain in right hip: Secondary | ICD-10-CM

## 2015-12-10 MED ORDER — TIOTROPIUM BROMIDE MONOHYDRATE 18 MCG IN CAPS: 18.0000 ug | ORAL_CAPSULE | Freq: Every day | RESPIRATORY_TRACT | Status: DC

## 2015-12-10 MED ORDER — FLUTICASONE-SALMETEROL 500-50 MCG/DOSE IN AEPB
1.0000 | INHALATION_SPRAY | Freq: Two times a day (BID) | RESPIRATORY_TRACT | Status: DC
Start: 2015-12-10 — End: 2016-05-07

## 2015-12-10 MED ORDER — ALPRAZOLAM 0.25 MG PO TABS: 0.2500 mg | ORAL_TABLET | Freq: Two times a day (BID) | ORAL | Status: DC | PRN

## 2015-12-10 NOTE — Progress Notes (Signed)
Pre-visit discussion using our clinic review tool. No additional management support is needed unless otherwise documented below in the visit note.  

## 2015-12-10 NOTE — Patient Instructions (Signed)
I have increased the quantity of Xanax to 45/month so you can take an additional dose IF NEEDED   Please return for fating labs on or after July 26th,  If your A1c is > 7.0 I will see you in 6 months

## 2015-12-10 NOTE — Progress Notes (Signed)
Subjective:  Patient ID: Maria Hinton, female    DOB: 01/16/1946  Age: 70 y.o. MRN: [id number]  CC: The primary encounter diagnosis was Breast cancer screening. Diagnoses of HTN (hypertension), benign, HLD (hyperlipidemia), Type 2 diabetes mellitus with stage 1 chronic kidney disease, without long-term current use of insulin ([place]), Right hip pain, and Insomnia secondary to anxiety were also pertinent to this visit.  HPI Maria Hinton presents for diabetes follow up.  Has been taking prednisone for right hip pain along with tramadol twice daily.  KK GAVE HER A 5 DAY STEROID TAPER,    Pain in hip and groin improved,  Thigh still hurting  somewhat,  Now her right hip is hurting.   DM Type 2: morning fastings have been 135 to 150 .  While on the prednisone she has been getting woken up by a hard sweat,  Sugar was 338 at 3:30 am,  But by 8 :00 am it was 147 .  Left knee fat accumulation medial side   Foot exam normal today   Lab Results  Component Value Date   HGBA1C 7.2* 10/29/2015     Outpatient Prescriptions Prior to Visit  Medication Sig Dispense Refill  . albuterol (PROVENTIL HFA;VENTOLIN HFA) 108 (90 BASE) MCG/ACT inhaler Inhale 2 puffs into the lungs every 6 (six) hours as needed for wheezing. 6.7 g 11  . aspirin EC 81 MG tablet Take 81 mg by mouth daily.    Marland Kitchen atenolol (TENORMIN) 50 MG tablet TAKE 1 TABLET (50 MG TOTAL) BY MOUTH 2 (TWO) TIMES DAILY. 180 tablet 1  . atorvastatin (LIPITOR) 40 MG tablet TAKE 1 TABLET BY MOUTH DAILY AT 6 PM. 90 tablet 3  . Blood Glucose Monitoring Suppl (ONE TOUCH ULTRA SYSTEM KIT) W/DEVICE KIT Pt needs OneTouch Ultra Blue glucometer dx 250.02 1 each 0  . glipiZIDE (GLUCOTROL) 10 MG tablet TAKE 1 TABLET BY MOUTH 2 TIMES DAILY BEFORE A MEAL. 60 tablet 5  . glucose blood (ONE TOUCH ULTRA TEST) test strip Use as instructed bid, has OneTouch Ultra Blue  dx: 250.02 100 each 3  . hydrochlorothiazide (HYDRODIURIL) 12.5 MG tablet TAKE 1 TABLET BY MOUTH  DAILY. 90 tablet 1  . Lancets (ONETOUCH ULTRASOFT) lancets Use as instructed bid dx 250.02 100 each 3  . lisinopril (PRINIVIL,ZESTRIL) 5 MG tablet Take 1 tablet (5 mg total) by mouth daily. 90 tablet 3  . meloxicam (MOBIC) 7.5 MG tablet TAKE 1 TABLET BY MOUTH EVERY DAY 30 tablet 5  . metFORMIN (GLUCOPHAGE) 1000 MG tablet TAKE 1 TABLET BY MOUTH 2 TIMES DAILY WITH A MEAL. 180 tablet 3  . moxifloxacin (AVELOX) 400 MG tablet Take 1 tablet (400 mg total) by mouth daily. 7 tablet 0  . omeprazole (PRILOSEC) 20 MG capsule TAKE ONE CAPSULE BY MOUTH EVERY DAY 90 capsule 1  . ALPRAZolam (XANAX) 0.25 MG tablet TAKE 1 TABLET BY MOUTH AS NEEDED FOR SLEEP 30 tablet 2  . Fluticasone-Salmeterol (ADVAIR DISKUS) 500-50 MCG/DOSE AEPB Inhale 1 puff into the lungs 2 (two) times daily. (Patient taking differently: Inhale 1 puff into the lungs daily. ) 60 each 0  . tiotropium (SPIRIVA) 18 MCG inhalation capsule Place 1 capsule (18 mcg total) into inhaler and inhale daily. (Patient taking differently: Place 18 mcg into inhaler and inhale 2 (two) times daily. ) 30 capsule 12  . diazepam (VALIUM) 10 MG tablet Take 1 tablet (10 mg total) by mouth every 12 (twelve) hours as needed (muscle spasm). (Patient not taking:  Reported on 12/10/2015) 30 tablet 1  . gabapentin (NEURONTIN) 100 MG capsule Take 1 capsule (100 mg total) by mouth 3 (three) times daily. (Patient not taking: Reported on 12/10/2015) 90 capsule 3   No facility-administered medications prior to visit.    Review of Systems;  Patient denies headache, fevers, malaise, unintentional weight loss, skin rash, eye pain, sinus congestion and sinus pain, sore throat, dysphagia,  hemoptysis , cough, dyspnea, wheezing, chest pain, palpitations, orthopnea, edema, abdominal pain, nausea, melena, diarrhea, constipation, flank pain, dysuria, hematuria, urinary  Frequency, nocturia, numbness, tingling, seizures,  Focal weakness, Loss of consciousness,  Tremor, insomnia, depression,  anxiety, and suicidal ideation.      Objective:  BP 132/72 mmHg  Pulse 71  Temp(Src) 98.1 F (36.7 C) (Oral)  Resp 12  Ht _0  (1.626 m)  Wt 160 lb 8 oz (72.802 kg)  BMI 27.54 kg/m2  SpO2 98%  BP Readings from Last 3 Encounters:  12/10/15 132/72  09/24/15 114/78  08/16/15 104/70    Wt Readings from Last 3 Encounters:  12/10/15 160 lb 8 oz (72.802 kg)  09/24/15 167 lb (75.751 kg)  08/16/15 165 lb 12 oz (75.184 kg)    General appearance: alert, cooperative and appears stated age Ears: normal TM's and external ear canals both ears Throat: lips, mucosa, and tongue normal; teeth and gums normal Neck: no adenopathy, no carotid bruit, supple, symmetrical, trachea midline and thyroid not enlarged, symmetric, no tenderness/mass/nodules Back: symmetric, no curvature. ROM normal. No CVA tenderness. Lungs: clear to auscultation bilaterally Heart: regular rate and rhythm, S1, S2 normal, no murmur, click, rub or gallop Abdomen: soft, non-tender; bowel sounds normal; no masses,  no organomegaly Pulses: 2+ and symmetric Skin: Skin color, texture, turgor normal. No rashes or lesions Lymph nodes: Cervical, supraclavicular, and axillary nodes normal.  Lab Results  Component Value Date   HGBA1C 7.2* 10/29/2015   HGBA1C 7.3* 06/20/2015   HGBA1C 7.4* 04/09/2015    Lab Results  Component Value Date   CREATININE 0.97 10/29/2015   CREATININE 1.07 08/13/2015   CREATININE 1.10* 06/20/2015    Lab Results  Component Value Date   WBC 11.7* 06/20/2015   HGB 14.1 06/20/2015   HCT 41.8 06/20/2015   PLT 294 06/20/2015   GLUCOSE 151* 10/29/2015   CHOL 246* 10/29/2015   TRIG 261.0* 10/29/2015   HDL 47.00 10/29/2015   LDLDIRECT 130.0 10/29/2015   LDLCALC 70 01/16/2015   ALT 20 10/29/2015   AST 17 10/29/2015   NA 141 10/29/2015   K 4.5 10/29/2015   CL 101 10/29/2015   CREATININE 0.97 10/29/2015   BUN 18 10/29/2015   CO2 31 10/29/2015   TSH 1.31 11/28/2013   INR 0.9 11/21/2012    HGBA1C 7.2* 10/29/2015   MICROALBUR 1.5 10/29/2015    Dg Chest 2 View  08/05/2015  CLINICAL DATA:  Cough and shortness of breath for the past 2 weeks, history of [place] OPD, former smoker. EXAM: CHEST  2 VIEW COMPARISON:  PA and lateral chest x-ray of Nov 21, 2012 FINDINGS: The lungs are mildly hyperinflated with hemidiaphragm flattening. There is no focal infiltrate. There is no pleural effusion. The heart and pulmonary vascularity are normal. The mediastinum is normal in width. There is surgical absence of the posterior aspects of the left eighth and ninth ribs. There is wire suture present over the left lower lateral hemi thorax. There is multilevel degenerative disc disease of the thoracic spine. IMPRESSION: COPD. There is no pneumonia, CHF, nor other  acute cardiopulmonary disease. Electronically Signed   By: David  Martinique M.D.   On: 08/05/2015 16:34    Assessment & Plan:   Problem List Items Addressed This Visit    Diabetes mellitus, type II ([place])    Improved since she has moved out on her own.  She is eating a more carbohydrate restricted diet and walking regularly for exercise.   Lab Results  Component Value Date   HGBA1C 7.2* 10/29/2015   Lab Results  Component Value Date   MICROALBUR 1.5 10/29/2015         Relevant Orders   Comprehensive metabolic panel   Hemoglobin A1c   Lipid panel   Microalbumin / creatinine urine ratio   Right hip pain    Secondary to OA and DJD. Managed by Barrie Lyme with steroids thus far.      Insomnia secondary to anxiety    Managed with alprazolam.  She has occasional trouble managing anxeity during the day since living alone and has requested additional tablets for prn use The risks and benefits of benzodiazepine use were revoewed with patient today including excessive sedation leading to respiratory depression,  impaired thinking/driving, and addiction.  Patient was advised to avoid concurrent use with alcohol, to use medication only as  needed and not to share with others  .       Relevant Medications   ALPRAZolam (XANAX) 0.25 MG tablet   HTN (hypertension), benign   HLD (hyperlipidemia)   Relevant Orders   LDL cholesterol, direct    Other Visit Diagnoses    Breast cancer screening    -  Primary    Relevant Orders    MM DIGITAL SCREENING BILATERAL      A total of 25 minutes of face to face time was spent with patient more than half of which was spent in counselling about the above mentioned conditions  and coordination of care   I have discontinued Ms. Reinhold's gabapentin. I have also changed her ALPRAZolam. Additionally, I am having her maintain her glucose blood, ONE TOUCH ULTRA SYSTEM KIT, onetouch ultrasoft, albuterol, diazepam, lisinopril, aspirin EC, meloxicam, moxifloxacin, atorvastatin, hydrochlorothiazide, metFORMIN, omeprazole, atenolol, glipiZIDE, predniSONE, traMADol, Fluticasone-Salmeterol, and tiotropium.  Meds ordered this encounter  Medications  . predniSONE (STERAPRED UNI-PAK 21 TAB) 5 MG (21) TBPK tablet    Sig: Take 10 mg by mouth daily.  . traMADol (ULTRAM) 50 MG tablet    Sig: Take 50 mg by mouth 4 (four) times daily as needed.  . Fluticasone-Salmeterol (ADVAIR DISKUS) 500-50 MCG/DOSE AEPB    Sig: Inhale 1 puff into the lungs 2 (two) times daily.    Dispense:  60 each    Refill:  0  . tiotropium (SPIRIVA) 18 MCG inhalation capsule    Sig: Place 1 capsule (18 mcg total) into inhaler and inhale daily.    Dispense:  30 capsule    Refill:  12  . ALPRAZolam (XANAX) 0.25 MG tablet    Sig: Take 1 tablet (0.25 mg total) by mouth 2 (two) times daily as needed for anxiety.    Dispense:  45 tablet    Refill:  5    Not to exceed 2 additional fills before 01/15/2016    Medications Discontinued During This Encounter  Medication Reason  . gabapentin (NEURONTIN) 100 MG capsule   . Fluticasone-Salmeterol (ADVAIR DISKUS) 500-50 MCG/DOSE AEPB   . Fluticasone-Salmeterol (ADVAIR DISKUS) 500-50 MCG/DOSE  AEPB Reorder  . tiotropium (SPIRIVA) 18 MCG inhalation capsule Reorder  . ALPRAZolam Duanne Moron)  0.25 MG tablet Reorder    Follow-up: Return for fasting labs after july 26 .   Crecencio Mc, MD

## 2015-12-12 DIAGNOSIS — F419 Anxiety disorder, unspecified: Secondary | ICD-10-CM | POA: Insufficient documentation

## 2015-12-12 DIAGNOSIS — F5105 Insomnia due to other mental disorder: Secondary | ICD-10-CM

## 2015-12-12 DIAGNOSIS — M25551 Pain in right hip: Secondary | ICD-10-CM | POA: Insufficient documentation

## 2015-12-12 NOTE — Assessment & Plan Note (Signed)
Managed with alprazolam.  She has occasional trouble managing anxeity during the day since living alone and has requested additional tablets for prn use The risks and benefits of benzodiazepine use were revoewed with patient today including excessive sedation leading to respiratory depression,  impaired thinking/driving, and addiction.  Patient was advised to avoid concurrent use with alcohol, to use medication only as needed and not to share with others  .

## 2015-12-12 NOTE — Assessment & Plan Note (Signed)
Secondary to OA and DJD. Managed by Barrie Lyme with steroids thus far.

## 2015-12-12 NOTE — Assessment & Plan Note (Signed)
Improved since she has moved out on her own.  She is eating a more carbohydrate restricted diet and walking regularly for exercise.   Lab Results  Component Value Date   HGBA1C 7.2* 10/29/2015   Lab Results  Component Value Date   MICROALBUR 1.5 10/29/2015

## 2015-12-15 ENCOUNTER — Other Ambulatory Visit: Payer: Self-pay | Admitting: Internal Medicine

## 2015-12-21 ENCOUNTER — Other Ambulatory Visit: Payer: Self-pay | Admitting: Internal Medicine

## 2015-12-23 NOTE — Telephone Encounter (Signed)
Last OV 12/10/15 ok to fill Mobic?

## 2015-12-25 NOTE — Telephone Encounter (Signed)
Refilled

## 2015-12-27 DIAGNOSIS — M1611 Unilateral primary osteoarthritis, right hip: Secondary | ICD-10-CM | POA: Diagnosis not present

## 2015-12-27 DIAGNOSIS — S72115A Nondisplaced fracture of greater trochanter of left femur, initial encounter for closed fracture: Secondary | ICD-10-CM | POA: Diagnosis not present

## 2015-12-27 DIAGNOSIS — M5416 Radiculopathy, lumbar region: Secondary | ICD-10-CM | POA: Diagnosis not present

## 2016-01-23 ENCOUNTER — Telehealth: Payer: Self-pay

## 2016-01-23 NOTE — Telephone Encounter (Signed)
Patient is on the list for [facility] 2017 and may be a good candidate for an AWV in 2017. Please let me know if/when appt is scheduled.   

## 2016-01-23 NOTE — Telephone Encounter (Signed)
AWV appointment scheduled for 01/30/16.

## 2016-01-30 ENCOUNTER — Ambulatory Visit (INDEPENDENT_AMBULATORY_CARE_PROVIDER_SITE_OTHER): Payer: Medicare HMO

## 2016-01-30 ENCOUNTER — Other Ambulatory Visit (INDEPENDENT_AMBULATORY_CARE_PROVIDER_SITE_OTHER): Payer: Medicare HMO

## 2016-01-30 VITALS — BP 120/68 | HR 74 | Temp 98.6°F | Resp 12 | Ht 64.5 in | Wt 163.8 lb

## 2016-01-30 DIAGNOSIS — N181 Chronic kidney disease, stage 1: Secondary | ICD-10-CM

## 2016-01-30 DIAGNOSIS — Z0000 Encounter for general adult medical examination without abnormal findings: Secondary | ICD-10-CM

## 2016-01-30 DIAGNOSIS — E785 Hyperlipidemia, unspecified: Secondary | ICD-10-CM

## 2016-01-30 DIAGNOSIS — E1122 Type 2 diabetes mellitus with diabetic chronic kidney disease: Secondary | ICD-10-CM

## 2016-01-30 LAB — LIPID PANEL
Cholesterol: 143 mg/dL (ref 0–200)
HDL: 42.8 mg/dL (ref >39.00)
NonHDL: 99.97
Total CHOL/HDL Ratio: 3
Triglycerides: 219 mg/dL — ABNORMAL HIGH (ref 0.0–149.0)
VLDL: 43.8 mg/dL — ABNORMAL HIGH (ref 0.0–40.0)

## 2016-01-30 LAB — COMPREHENSIVE METABOLIC PANEL
ALT: 18 U/L (ref 0–35)
AST: 16 U/L (ref 0–37)
Albumin: 4.3 g/dL (ref 3.5–5.2)
Alkaline Phosphatase: 88 U/L (ref 39–117)
BUN: 18 mg/dL (ref 6–23)
CO2: 29 mEq/L (ref 19–32)
Calcium: 9 mg/dL (ref 8.4–10.5)
Chloride: 101 mEq/L (ref 96–112)
Creatinine, Ser: 1 mg/dL (ref 0.40–1.20)
GFR: 58.61 mL/min — ABNORMAL LOW (ref >60.00)
Glucose, Bld: 160 mg/dL — ABNORMAL HIGH (ref 70–99)
Potassium: 4.1 mEq/L (ref 3.5–5.1)
Sodium: 139 mEq/L (ref 135–145)
Total Bilirubin: 0.4 mg/dL (ref 0.2–1.2)
Total Protein: 7 g/dL (ref 6.0–8.3)

## 2016-01-30 LAB — MICROALBUMIN / CREATININE URINE RATIO
Creatinine,U: 239.9 mg/dL
Microalb Creat Ratio: 0.4 mg/g (ref 0.0–30.0)
Microalb, Ur: 1 mg/dL (ref 0.0–1.9)

## 2016-01-30 LAB — HEMOGLOBIN A1C: Hgb A1c MFr Bld: 7.1 % — ABNORMAL HIGH (ref 4.6–6.5)

## 2016-01-30 LAB — LDL CHOLESTEROL, DIRECT: Direct LDL: 69 mg/dL

## 2016-01-30 NOTE — Patient Instructions (Addendum)
Maria Hinton , Thank you for taking time to come for your Medicare Wellness Visit. I appreciate your ongoing commitment to your health goals. Please review the following plan we discussed and let me know if I can assist you in the future.   Follow up with Dr. Derrel Nip as needed.   This is a list of the screening recommended for you and due dates:  Health Maintenance  Topic Date Due  . Shingles Vaccine  02/12/2008  . Eye exam for diabetics  01/01/2016  . Flu Shot  02/04/2016  . Hemoglobin A1C  04/29/2016  . Complete foot exam   08/15/2016  . Pneumonia vaccines (2 of 2 - PPSV23) 09/10/2016  . Mammogram  09/26/2016  . Tetanus Vaccine  02/10/2024  . Colon Cancer Screening  05/12/2025  . DEXA scan (bone density measurement)  Completed  .  Hepatitis C: One time screening is recommended by Center for Disease Control  (CDC) for  adults born from [birth year] through 1963.   Completed   Fall Prevention in the Home  Falls can cause injuries. They can happen to people of all ages. There are many things you can do to make your home safe and to help prevent falls.  WHAT CAN I DO ON THE OUTSIDE OF MY HOME?  Regularly fix the edges of walkways and driveways and fix any cracks.  Remove anything that might make you trip as you walk through a door, such as a raised step or threshold.  Trim any bushes or trees on the path to your home.  Use bright outdoor lighting.  Clear any walking paths of anything that might make someone trip, such as rocks or tools.  Regularly check to see if handrails are loose or broken. Make sure that both sides of any steps have handrails.  Any raised decks and porches should have guardrails on the edges.  Have any leaves, snow, or ice cleared regularly.  Use sand or salt on walking paths during winter.  Clean up any spills in your garage right away. This includes oil or grease spills. WHAT CAN I DO IN THE BATHROOM?   Use night lights.  Install grab bars by the toilet  and in the tub and shower. Do not use towel bars as grab bars.  Use non-skid mats or decals in the tub or shower.  If you need to sit down in the shower, use a plastic, non-slip stool.  Keep the floor dry. Clean up any water that spills on the floor as soon as it happens.  Remove soap buildup in the tub or shower regularly.  Attach bath mats securely with double-sided non-slip rug tape.  Do not have throw rugs and other things on the floor that can make you trip. WHAT CAN I DO IN THE BEDROOM?  Use night lights.  Make sure that you have a light by your bed that is easy to reach.  Do not use any sheets or blankets that are too big for your bed. They should not hang down onto the floor.  Have a firm chair that has side arms. You can use this for support while you get dressed.  Do not have throw rugs and other things on the floor that can make you trip. WHAT CAN I DO IN THE KITCHEN?  Clean up any spills right away.  Avoid walking on wet floors.  Keep items that you use a lot in easy-to-reach places.  If you need to reach something above you,  use a strong step stool that has a grab bar.  Keep electrical cords out of the way.  Do not use floor polish or wax that makes floors slippery. If you must use wax, use non-skid floor wax.  Do not have throw rugs and other things on the floor that can make you trip. WHAT CAN I DO WITH MY STAIRS?  Do not leave any items on the stairs.  Make sure that there are handrails on both sides of the stairs and use them. Fix handrails that are broken or loose. Make sure that handrails are as long as the stairways.  Check any carpeting to make sure that it is firmly attached to the stairs. Fix any carpet that is loose or worn.  Avoid having throw rugs at the top or bottom of the stairs. If you do have throw rugs, attach them to the floor with carpet tape.  Make sure that you have a light switch at the top of the stairs and the bottom of the  stairs. If you do not have them, ask someone to add them for you. WHAT ELSE CAN I DO TO HELP PREVENT FALLS?  Wear shoes that:  Do not have high heels.  Have rubber bottoms.  Are comfortable and fit you well.  Are closed at the toe. Do not wear sandals.  If you use a stepladder:  Make sure that it is fully opened. Do not climb a closed stepladder.  Make sure that both sides of the stepladder are locked into place.  Ask someone to hold it for you, if possible.  Clearly mark and make sure that you can see:  Any grab bars or handrails.  First and last steps.  Where the edge of each step is.  Use tools that help you move around (mobility aids) if they are needed. These include:  Canes.  Walkers.  Scooters.  Crutches.  Turn on the lights when you go into a dark area. Replace any light bulbs as soon as they burn out.  Set up your furniture so you have a clear path. Avoid moving your furniture around.  If any of your floors are uneven, fix them.  If there are any pets around you, be aware of where they are.  Review your medicines with your doctor. Some medicines can make you feel dizzy. This can increase your chance of falling. Ask your doctor what other things that you can do to help prevent falls.   This information is not intended to replace advice given to you by your health care provider. Make sure you discuss any questions you have with your health care provider.   Document Released: 04/18/2009 Document Revised: 11/06/2014 Document Reviewed: 07/27/2014 Elsevier Interactive Patient Education 2016 [employer].   Steps to Quit Smoking  Smoking tobacco can be harmful to your health and can affect almost every organ in your body. Smoking puts you, and those around you, at risk for developing many serious chronic diseases. Quitting smoking is difficult, but it is one of the best things that you can do for your health. It is never too late to quit. WHAT ARE THE  BENEFITS OF QUITTING SMOKING? When you quit smoking, you lower your risk of developing serious diseases and conditions, such as:  Lung cancer or lung disease, such as COPD.  Heart disease.  Stroke.  Heart attack.  Infertility.  Osteoporosis and bone fractures. Additionally, symptoms such as coughing, wheezing, and shortness of breath may get better when you  quit. You may also find that you get sick less often because your body is stronger at fighting off colds and infections. If you are pregnant, quitting smoking can help to reduce your chances of having a baby of low birth weight. HOW DO I GET READY TO QUIT? When you decide to quit smoking, create a plan to make sure that you are successful. Before you quit:  Pick a date to quit. Set a date within the next two weeks to give you time to prepare.  Write down the reasons why you are quitting. Keep this list in places where you will see it often, such as on your bathroom mirror or in your car or wallet.  Identify the people, places, things, and activities that make you want to smoke (triggers) and avoid them. Make sure to take these actions:  Throw away all cigarettes at home, at work, and in your car.  Throw away smoking accessories, such as Scientist, research (medical).  Clean your car and make sure to empty the ashtray.  Clean your home, including curtains and carpets.  Tell your family, friends, and coworkers that you are quitting. Support from your loved ones can make quitting easier.  Talk with your health care provider about your options for quitting smoking.  Find out what treatment options are covered by your health insurance. WHAT STRATEGIES CAN I USE TO QUIT SMOKING?  Talk with your healthcare provider about different strategies to quit smoking. Some strategies include:  Quitting smoking altogether instead of gradually lessening how much you smoke over a period of time. Research shows that quitting "cold [place]" is more  successful than gradually quitting.  Attending in-person counseling to help you build problem-solving skills. You are more likely to have success in quitting if you attend several counseling sessions. Even short sessions of 10 minutes can be effective.  Finding resources and support systems that can help you to quit smoking and remain smoke-free after you quit. These resources are most helpful when you use them often. They can include:  Online chats with a Social worker.  Telephone quitlines.  Printed Furniture conservator/restorer.  Support groups or group counseling.  Text messaging programs.  Mobile phone applications.  Taking medicines to help you quit smoking. (If you are pregnant or breastfeeding, talk with your health care provider first.) Some medicines contain nicotine and some do not. Both types of medicines help with cravings, but the medicines that include nicotine help to relieve withdrawal symptoms. Your health care provider may recommend:  Nicotine patches, gum, or lozenges.  Nicotine inhalers or sprays.  Non-nicotine medicine that is taken by mouth. Talk with your health care provider about combining strategies, such as taking medicines while you are also receiving in-person counseling. Using these two strategies together makes you more likely to succeed in quitting than if you used either strategy on its own. If you are pregnant or breastfeeding, talk with your health care provider about finding counseling or other support strategies to quit smoking. Do not take medicine to help you quit smoking unless told to do so by your health care provider. WHAT THINGS CAN I DO TO MAKE IT EASIER TO QUIT? Quitting smoking might feel overwhelming at first, but there is a lot that you can do to make it easier. Take these important actions:  Reach out to your family and friends and ask that they support and encourage you during this time. Call telephone quitlines, reach out to support groups, or work  with a counselor  for support.  Ask people who smoke to avoid smoking around you.  Avoid places that trigger you to smoke, such as bars, parties, or smoke-break areas at work.  Spend time around people who do not smoke.  Lessen stress in your life, because stress can be a smoking trigger for some people. To lessen stress, try:  Exercising regularly.  Deep-breathing exercises.  Yoga.  Meditating.  Performing a body scan. This involves closing your eyes, scanning your body from head to toe, and noticing which parts of your body are particularly tense. Purposefully relax the muscles in those areas.  Download or purchase mobile phone or tablet apps (applications) that can help you stick to your quit plan by providing reminders, tips, and encouragement. There are many free apps, such as QuitGuide from the [employer] Office manager for Disease Control and Prevention). You can find other support for quitting smoking (smoking cessation) through smokefree.gov and other websites. HOW WILL I FEEL WHEN I QUIT SMOKING? Within the first 24 hours of quitting smoking, you may start to feel some withdrawal symptoms. These symptoms are usually most noticeable 2-3 days after quitting, but they usually do not last beyond 2-3 weeks. Changes or symptoms that you might experience include:  Mood swings.  Restlessness, anxiety, or irritation.  Difficulty concentrating.  Dizziness.  Strong cravings for sugary foods in addition to nicotine.  Mild weight gain.  Constipation.  Nausea.  Coughing or a sore throat.  Changes in how your medicines work in your body.  A depressed mood.  Difficulty sleeping (insomnia). After the first 2-3 weeks of quitting, you may start to notice more positive results, such as:  Improved sense of smell and taste.  Decreased coughing and sore throat.  Slower heart rate.  Lower blood pressure.  Clearer skin.  The ability to breathe more easily.  Fewer sick days. Quitting  smoking is very challenging for most people. Do not get discouraged if you are not successful the first time. Some people need to make many attempts to quit before they achieve long-term success. Do your best to stick to your quit plan, and talk with your health care provider if you have any questions or concerns.   This information is not intended to replace advice given to you by your health care provider. Make sure you discuss any questions you have with your health care provider.   Document Released: 06/16/2001 Document Revised: 11/06/2014 Document Reviewed: 11/06/2014 Elsevier Interactive Patient Education [employer].

## 2016-01-30 NOTE — Progress Notes (Signed)
Subjective:   Maria Hinton is a 70 y.o. female who presents for an Initial Medicare Annual Wellness Visit.  Review of Systems    No ROS.  Medicare Wellness Visit.  Cardiac Risk Factors include: advanced age (>28mn, >>80women);diabetes mellitus;smoking/ tobacco exposure     Objective:    Today's Vitals   01/30/16 0926 01/30/16 0934  BP: 120/68   Pulse: 74   Resp: 12   Temp: 98.6 F (37 C)   TempSrc: Oral   SpO2: 99%   Weight: 163 lb 12.8 oz (74.3 kg)   Height: 5' 4.5" (1.638 m)   PainSc:  2    Body mass index is 27.68 kg/m.   Current Medications (verified) Outpatient Encounter Prescriptions as of 01/30/2016  Medication Sig  . albuterol (PROVENTIL HFA;VENTOLIN HFA) 108 (90 BASE) MCG/ACT inhaler Inhale 2 puffs into the lungs every 6 (six) hours as needed for wheezing.  .Marland KitchenALPRAZolam (XANAX) 0.25 MG tablet Take 1 tablet (0.25 mg total) by mouth 2 (two) times daily as needed for anxiety.  .Marland Kitchenaspirin EC 81 MG tablet Take 81 mg by mouth daily.  .Marland Kitchenatenolol (TENORMIN) 50 MG tablet TAKE 1 TABLET (50 MG TOTAL) BY MOUTH 2 (TWO) TIMES DAILY.  .Marland Kitchenatorvastatin (LIPITOR) 40 MG tablet TAKE 1 TABLET BY MOUTH DAILY AT 6 PM.  . Blood Glucose Monitoring Suppl (ONE TOUCH ULTRA SYSTEM KIT) W/DEVICE KIT Pt needs OneTouch Ultra Blue glucometer dx 250.02  . diazepam (VALIUM) 10 MG tablet Take 1 tablet (10 mg total) by mouth every 12 (twelve) hours as needed (muscle spasm).  . Fluticasone-Salmeterol (ADVAIR DISKUS) 500-50 MCG/DOSE AEPB Inhale 1 puff into the lungs 2 (two) times daily.  .Marland KitchenglipiZIDE (GLUCOTROL) 10 MG tablet TAKE 1 TABLET BY MOUTH 2 TIMES DAILY BEFORE A MEAL.  .Marland Kitchenglucose blood (ONE TOUCH ULTRA TEST) test strip Use as instructed bid, has OneTouch Ultra Blue  dx: 250.02  . hydrochlorothiazide (HYDRODIURIL) 12.5 MG tablet TAKE 1 TABLET BY MOUTH DAILY.  .Marland KitchenLancets (ONETOUCH ULTRASOFT) lancets Use as instructed bid dx 250.02  . lisinopril (PRINIVIL,ZESTRIL) 5 MG tablet TAKE 1 TABLET (5 MG  TOTAL) BY MOUTH DAILY.  . meloxicam (MOBIC) 7.5 MG tablet TAKE 1 TABLET BY MOUTH EVERY DAY  . metFORMIN (GLUCOPHAGE) 1000 MG tablet TAKE 1 TABLET BY MOUTH 2 TIMES DAILY WITH A MEAL.  .Marland Kitchenmoxifloxacin (AVELOX) 400 MG tablet Take 1 tablet (400 mg total) by mouth daily.  .Marland Kitchenomeprazole (PRILOSEC) 20 MG capsule TAKE ONE CAPSULE BY MOUTH EVERY DAY  . predniSONE (STERAPRED UNI-PAK 21 TAB) 5 MG (21) TBPK tablet Take 10 mg by mouth daily.  .Marland Kitchentiotropium (SPIRIVA) 18 MCG inhalation capsule Place 1 capsule (18 mcg total) into inhaler and inhale daily.  . traMADol (ULTRAM) 50 MG tablet Take 50 mg by mouth 4 (four) times daily as needed.   No facility-administered encounter medications on file as of 01/30/2016.     Allergies (verified) Vicodin [hydrocodone-acetaminophen]   History: Past Medical History:  Diagnosis Date  . Arthritis   . Asthma   . Chronic back pain    DDD,herniated disc/spondylosis/radiculopathy  . COPD (chronic obstructive pulmonary disease) ([place]   . Diabetes mellitus   . Early cataracts, bilateral    Dr. RRuthine Dose- [facility] . GERD (gastroesophageal reflux disease)    takes Omeprazole daily  . Hemorrhoids   . History of colonic polyps    colonoscopy 04/2012 - Dr OCandace Cruise . Hyperlipidemia    takes  Pravastatin daily  . Hypertension    takes Metoprolol/HCTZ daily  . Insomnia   . Joint pain    fingers  . Joint swelling   . Pneumonia    x 2 ;in Dec 2010/2011  . PONV (postoperative nausea and vomiting)    Past Surgical History:  Procedure Laterality Date  . ABDOMINAL HYSTERECTOMY     at age 19  . BACK SURGERY  20+yrs ago  . COLONOSCOPY    . COLONOSCOPY N/A 05/13/2015   Procedure: COLONOSCOPY;  Surgeon: Hulen Luster, MD;  Location: [facility] ENDOSCOPY;  Service: Gastroenterology;  Laterality: N/A;  . ESOPHAGOGASTRODUODENOSCOPY  2011  . FOOT SURGERY  2011   bunionectomy and knot removed from bottom of both feet  . JOINT REPLACEMENT Left 2014   hip  Kraskinski  . LUMBAR  LAMINECTOMY/DECOMPRESSION MICRODISCECTOMY  09/10/2011   Procedure: LUMBAR LAMINECTOMY/DECOMPRESSION MICRODISCECTOMY 1 LEVEL;  Surgeon: Hosie Spangle, MD;  Location: [place] NEURO ORS;  Service: Neurosurgery;  Laterality: Right;  RIGHT Lumbar  Laminotomy and microdiskectomy Lumbar Four-Five  . LUMBAR LAMINECTOMY/DECOMPRESSION MICRODISCECTOMY N/A 06/26/2015   Procedure: LUMBAR LAMINECTOMY/DECOMPRESSION MICRODISCECTOMY 1 LEVEL;  Surgeon: Jovita Gamma, MD;  Location: [facility] NEURO ORS;  Service: Neurosurgery;  Laterality: N/A;  L3 and L4 Laminectomies  . MELANOMA EXCISION  2011   on nose/back on neck  . THORACIC DISC SURGERY  at age 5 and 72    mass of veins that had gotten into muscle and wrapped around rib;3 ribs also removed from left side  . TONSILLECTOMY  as a child   and adenoids   . TOTAL HIP ARTHROPLASTY Left 2014   Dr. Mack Guise  . TUBAL LIGATION    . VAGINA SURGERY     vaginal wall ruptured    Family History  Problem Relation Age of Onset  . Heart disease Brother     valve replacement  . COPD Brother   . COPD Mother     Emphysema  . Diabetes Mother   . Heart disease Father   . Seizures Father   . Cancer Father     Lung cancer with brain metastasis  . Asthma Brother   . COPD Son 90  . Anesthesia problems Neg Hx   . Hypotension Neg Hx   . Malignant hyperthermia Neg Hx   . Pseudochol deficiency Neg Hx    Social History   Occupational History  . Not on file.   Social History Main Topics  . Smoking status: Current Every Day Smoker    Packs/day: 0.25    Years: 20.00    Types: Cigarettes    Last attempt to quit: 02/04/2015  . Smokeless tobacco: Never Used     Comment: Pt quit 11/08/2014.Pt had quit smoking for 13 years before starting back in 2010  . Alcohol use No  . Drug use: No  . Sexual activity: No    Tobacco Counseling Ready to quit: Yes Counseling given: Yes   Activities of Daily Living In your present state of health, do you have any difficulty performing the  following activities: 01/30/2016 06/20/2015  Hearing? N -  Vision? N -  Difficulty concentrating or making decisions? N -  Walking or climbing stairs? N Y  Dressing or bathing? N -  Doing errands, shopping? N -  Preparing Food and eating ? N -  Using the Toilet? N -  In the past six months, have you accidently leaked urine? N -  Do you have problems with loss of bowel control? N -  Managing your Medications? N -  Managing your Finances? N -  Housekeeping or managing your Housekeeping? N -  Some recent data might be hidden    Immunizations and Health Maintenance Immunization History  Administered Date(s) Administered  . Influenza Split 09/11/2011  . Influenza,inj,Quad PF,36+ Mos 04/27/2013, 09/17/2014, 04/12/2015  . Pneumococcal Conjugate-13 02/09/2014  . Pneumococcal Polysaccharide-23 09/11/2011  . Tdap 02/09/2014   Health Maintenance Due  Topic Date Due  . ZOSTAVAX  02/12/2008  . OPHTHALMOLOGY EXAM  01/01/2016    Patient Care Team: Crecencio Mc, MD as PCP - General (Internal Medicine)  Indicate any recent Medical Services you may have received from other than [facility] providers in the past year (date may be approximate).     Assessment:   This is a routine wellness examination for Carmalita. The goal of the wellness visit is to assist the patient how to close the gaps in care and create a preventative care plan for the patient.   Osteoporosis risk reviewed.  Medications reviewed; taking without issues or barriers.  Safety issues reviewed; smoke detectors in the home. No firearms in the home. Wears seatbelts when driving or riding with others. No violence in the home.  No identified risk were noted; The patient was oriented x 3; appropriate in dress and manner and no objective failures at ADL's or IADL's.   Body mass index; discussed the importance of a healthy diet, water intake and exercise. Educational material provided.  ZOSTAVAX vaccine postponed for follow up  with Insurance.  Type 2 diabetes mell-stable and followed by PCP. Type 2 diabetes mellitus w oth diabetic kidney com-stable and followed by PCP. Parox atrial tachycardia-stable and followed by PCP.  Patient Concerns: Still experiencing daily L sided aching from the top of head, through the temple.  States her L eye feels drawn. Intermittent sharp shooting pains have resumed.  Last experienced x 3 days ago.  Pain resides after she lowers head in hands several minutes.  Appointment offered to follow up with PCP.  She declined and stated she would wait to be contacted regarding most recent labs and make an appointment at that time if needed.    Hearing/Vision screen Hearing Screening Comments: Passes the whisper test Vision Screening Comments: Followed by Suzie Portela, [place] (Dr. Steffanie Rainwater) Annual visits Wears reading glasses  Dietary issues and exercise activities discussed: Current Exercise Habits: Home exercise routine, Type of exercise: walking;stretching, Time (Minutes): 30, Frequency (Times/Week): 6, Weekly Exercise (Minutes/Week): 180, Intensity: Mild  Goals    . Increase water intake          Increase water intake by finishing a minimum of 1 bottle (2 cups) per day.    . Quit Smoking (pt-stated)          Currently smokes 3 cigarettes daily x2 months.  Stop smoking and start walking more an extra 30 minutes daily.    . Weight (lb) < 150 lb (68 kg) (pt-stated)      Depression Screen PHQ 2/9 Scores 01/30/2016 09/17/2014 04/11/2013  PHQ - 2 Score 0 5 0  PHQ- 9 Score - 10 -    Fall Risk Fall Risk  01/30/2016 09/17/2014 04/11/2013  Falls in the past year? Yes No No  Number falls in past yr: 1 - -  Injury with Fall? Yes - -  Follow up Education provided;Falls prevention discussed - -    Cognitive Function: MMSE - Mini Mental State Exam 01/30/2016  Orientation to time 5  Orientation to Place 5  Registration 3  Attention/ Calculation 5  Recall 3  Language- name 2 objects 2    Language- repeat 1  Language- follow 3 step command 3  Language- read & follow direction 1  Write a sentence 1  Copy design 1  Total score 30    Screening Tests Health Maintenance  Topic Date Due  . ZOSTAVAX  02/12/2008  . OPHTHALMOLOGY EXAM  01/01/2016  . INFLUENZA VACCINE  02/04/2016  . HEMOGLOBIN A1C  04/29/2016  . FOOT EXAM  08/15/2016  . PNA vac Low Risk Adult (2 of 2 - PPSV23) 09/10/2016  . MAMMOGRAM  09/26/2016  . TETANUS/TDAP  02/10/2024  . COLONOSCOPY  05/12/2025  . DEXA SCAN  Completed  . Hepatitis C Screening  Completed      Plan:   End of life planning; Advance aging; Advanced directives discussed. Copy of current HCPOA/Living Will requested.   During the course of the visit, Shekina was educated and counseled about the following appropriate screening and preventive services:   Vaccines to include Pneumoccal, Influenza, Hepatitis B, Td, Zostavax, HCV  Electrocardiogram  Cardiovascular disease screening  Colorectal cancer screening  Bone density screening  Diabetes screening  Glaucoma screening  Mammography/PAP  Nutrition counseling  Smoking cessation counseling  Patient Instructions (the written plan) were given to the patient.    Varney Biles, LPN   07/22/3565

## 2016-02-02 ENCOUNTER — Encounter: Payer: Self-pay | Admitting: Internal Medicine

## 2016-02-02 NOTE — Progress Notes (Signed)
  I have reviewed the above information and agree with above.    , MD 

## 2016-02-19 ENCOUNTER — Other Ambulatory Visit: Payer: Self-pay | Admitting: Internal Medicine

## 2016-03-18 ENCOUNTER — Ambulatory Visit: Payer: Medicare HMO

## 2016-03-20 ENCOUNTER — Ambulatory Visit: Payer: Medicare HMO | Admitting: Internal Medicine

## 2016-03-20 ENCOUNTER — Other Ambulatory Visit: Payer: Self-pay | Admitting: Internal Medicine

## 2016-04-01 ENCOUNTER — Ambulatory Visit: Payer: Medicare HMO

## 2016-04-20 IMAGING — CR DG CHEST 2V
2 series · 2 of 2 positions shown · non-contrast
Comparison: PA and lateral chest x-ray November 21, 2012

CLINICAL DATA: Cough and shortness of breath for the past 2 weeks,
history of [place] OPD, former smoker.

EXAM:
CHEST  2 VIEW

[view not recorded (1 of 2)]
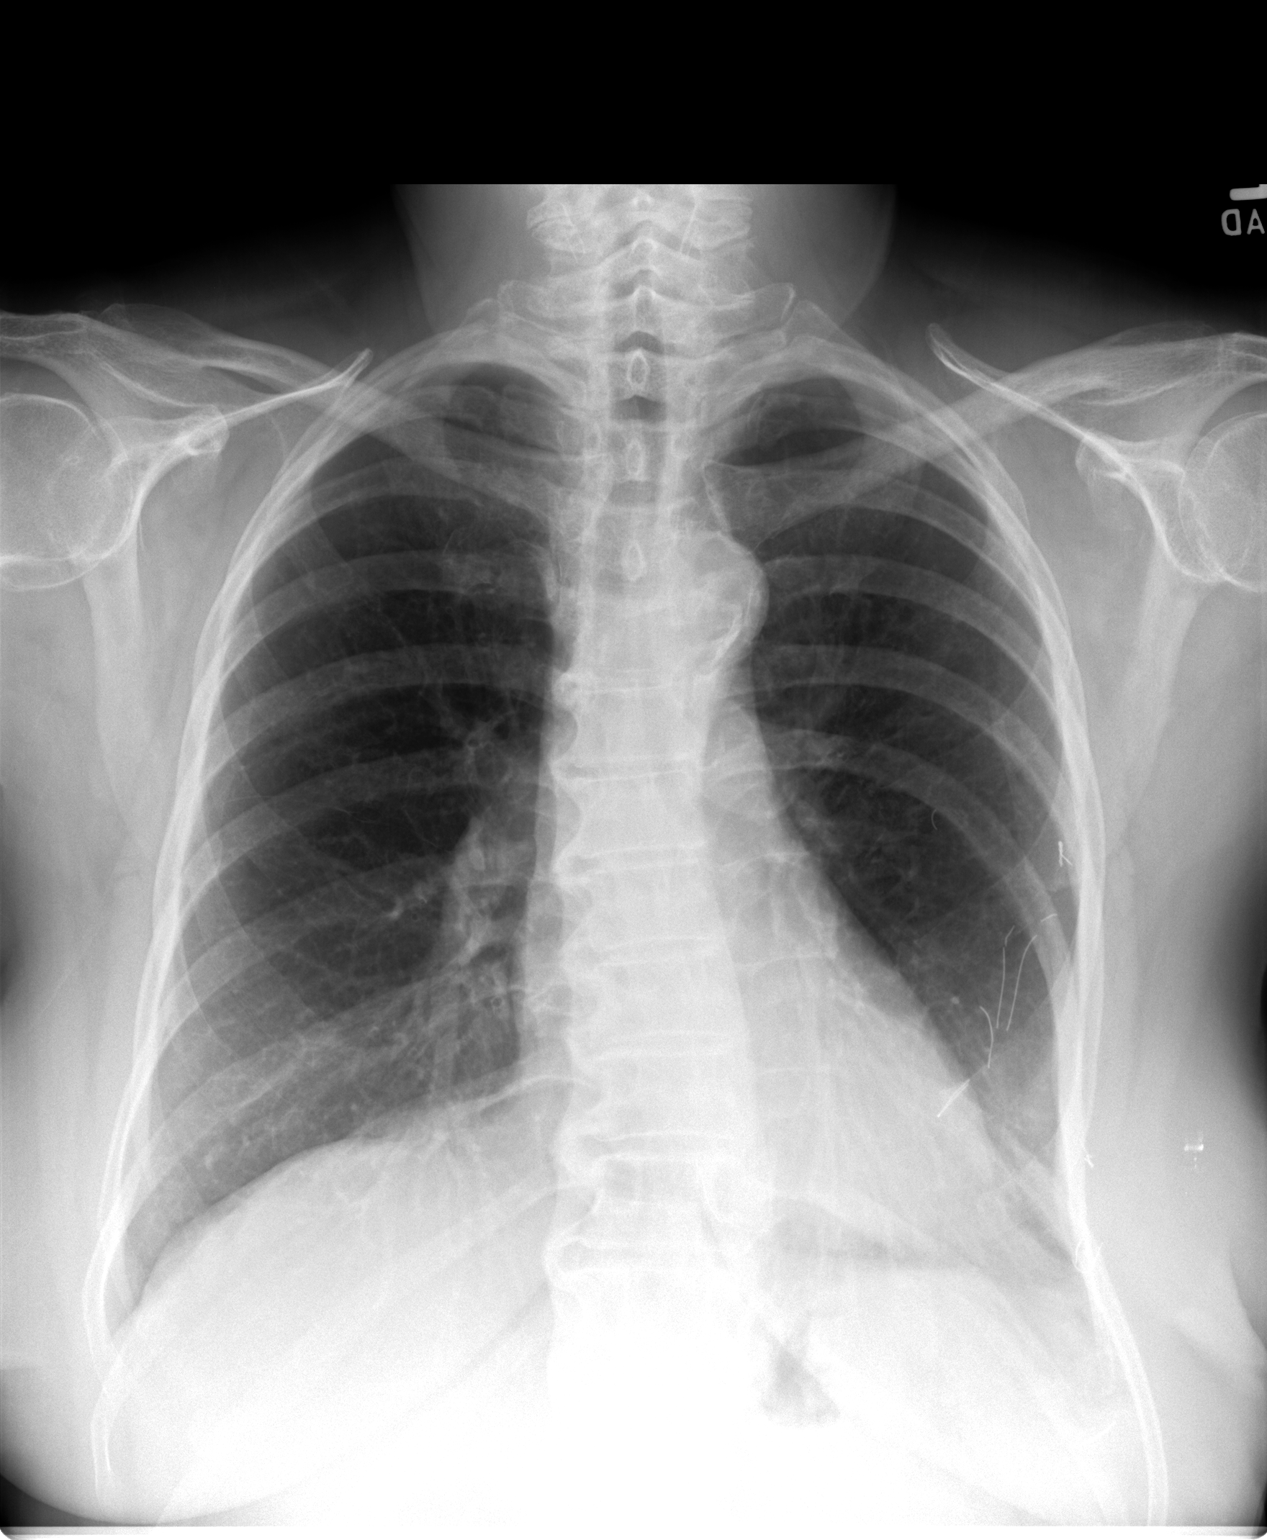

[view not recorded (2 of 2)]
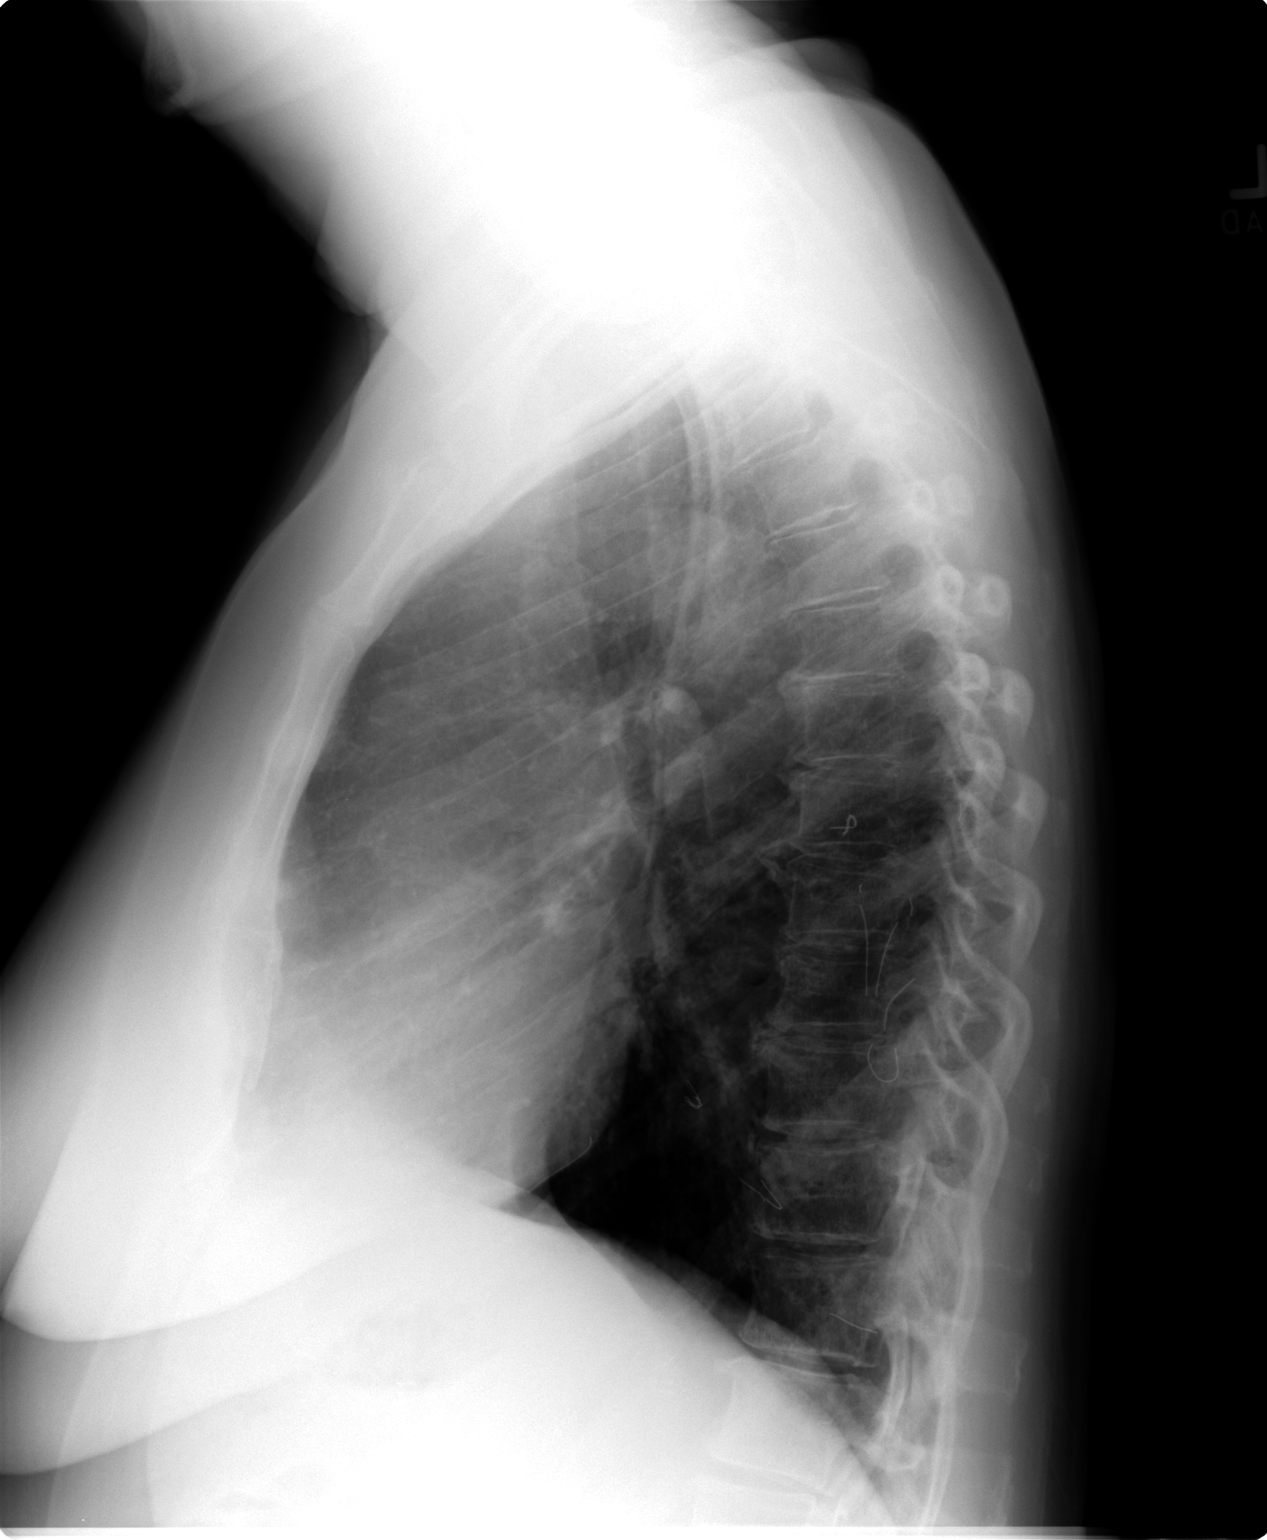

[2 of 2 positions shown; findings below may reference images not displayed]

FINDINGS: The lungs are mildly hyperinflated with hemidiaphragm flattening.
There is no focal infiltrate. There is no pleural effusion. The
heart and pulmonary vascularity are normal. The mediastinum is
normal in width. There is surgical absence of the posterior aspects
of the left eighth and ninth ribs. There is wire suture present over
the left lower lateral hemi thorax. There is multilevel degenerative
disc disease of the thoracic spine.
IMPRESSION: COPD. There is no pneumonia, CHF, nor other acute cardiopulmonary
disease.

## 2016-04-21 ENCOUNTER — Telehealth: Payer: Self-pay | Admitting: *Deleted

## 2016-04-21 DIAGNOSIS — E7849 Other hyperlipidemia: Secondary | ICD-10-CM

## 2016-04-21 DIAGNOSIS — E119 Type 2 diabetes mellitus without complications: Secondary | ICD-10-CM

## 2016-04-21 DIAGNOSIS — I10 Essential (primary) hypertension: Secondary | ICD-10-CM

## 2016-04-21 NOTE — Telephone Encounter (Signed)
Patient questioned if she will need a lab prior to her appt on 06/03/16

## 2016-04-22 NOTE — Telephone Encounter (Signed)
Labs in please schedule  

## 2016-04-22 NOTE — Telephone Encounter (Signed)
Pt scheduled  

## 2016-05-04 ENCOUNTER — Ambulatory Visit (INDEPENDENT_AMBULATORY_CARE_PROVIDER_SITE_OTHER): Payer: Medicare HMO | Admitting: *Deleted

## 2016-05-04 DIAGNOSIS — J449 Chronic obstructive pulmonary disease, unspecified: Secondary | ICD-10-CM

## 2016-05-04 LAB — PULMONARY FUNCTION TEST
DL/VA % pred: 71 %
DL/VA: 3.45 ml/min/mmHg/L
DLCO unc % pred: 86 %
DLCO unc: 21.01 ml/min/mmHg
FEF 25-75 Post: 1.51 L/sec
FEF 25-75 Pre: 1.3 L/sec
FEF2575-%Change-Post: 15 %
FEF2575-%Pred-Post: 76 %
FEF2575-%Pred-Pre: 65 %
FEV1-%Change-Post: 2 %
FEV1-%Pred-Post: 76 %
FEV1-%Pred-Pre: 75 %
FEV1-Post: 1.79 L
FEV1-Pre: 1.74 L
FEV1FVC-%Change-Post: -1 %
FEV1FVC-%Pred-Pre: 98 %
FEV6-%Change-Post: 4 %
FEV6-%Pred-Post: 82 %
FEV6-%Pred-Pre: 79 %
FEV6-Post: 2.43 L
FEV6-Pre: 2.33 L
FEV6FVC-%Pred-Post: 104 %
FEV6FVC-%Pred-Pre: 104 %
FVC-%Change-Post: 4 %
FVC-%Pred-Post: 79 %
FVC-%Pred-Pre: 76 %
FVC-Post: 2.43 L
FVC-Pre: 2.33 L
Post FEV1/FVC ratio: 74 %
Post FEV6/FVC ratio: 100 %
Pre FEV1/FVC ratio: 75 %
Pre FEV6/FVC Ratio: 100 %
RV % pred: 106 %
RV: 2.28 L
TLC % pred: 94 %
TLC: 4.78 L

## 2016-05-04 NOTE — Progress Notes (Signed)
PFT performed today. 

## 2016-05-04 NOTE — Progress Notes (Signed)
SMW performed today. 

## 2016-05-07 ENCOUNTER — Ambulatory Visit (INDEPENDENT_AMBULATORY_CARE_PROVIDER_SITE_OTHER): Payer: Medicare HMO | Admitting: Internal Medicine

## 2016-05-07 ENCOUNTER — Encounter: Payer: Self-pay | Admitting: Internal Medicine

## 2016-05-07 VITALS — BP 110/68 | HR 64 | Ht 64.0 in | Wt 169.0 lb

## 2016-05-07 DIAGNOSIS — J449 Chronic obstructive pulmonary disease, unspecified: Secondary | ICD-10-CM | POA: Diagnosis not present

## 2016-05-07 MED ORDER — TIOTROPIUM BROMIDE MONOHYDRATE 18 MCG IN CAPS: 18.0000 ug | ORAL_CAPSULE | Freq: Every day | RESPIRATORY_TRACT | 12 refills | Status: DC

## 2016-05-07 MED ORDER — FLUTICASONE-SALMETEROL 500-50 MCG/DOSE IN AEPB: 1.0000 | INHALATION_SPRAY | Freq: Two times a day (BID) | RESPIRATORY_TRACT | 0 refills | Status: DC

## 2016-05-07 NOTE — Addendum Note (Signed)
Addended by: Vilinda Boehringer on: 05/07/2016 11:15 AM   Modules accepted: Orders

## 2016-05-07 NOTE — Patient Instructions (Signed)
Follow up with Dr. Ashby Dawes in 4 months - cont with current inhalers, -gargle and rinse after each use.  - cont to avoid all forms of tobacco - exercise as tolerated.

## 2016-05-07 NOTE — Assessment & Plan Note (Addendum)
Patient with history of COPD B, Moderate airflow limitation (GOLD STAGE I) - now with moderate improvement since quit smoking and moved out of smoking environment. Walking on on a regular basis - up to 3 times a day on occasion.  Currently on Advair and spiriva, and doing well.  6MWT - 1273ft/375m, lowest sat 97%, highest HR 104  PFTs 05/04/2016 FEV1 75%, FVC 76%, FEV/FVC 75%, RV 106%, TLC 94%, ERV 35%  Plan Continue with Spiriva and Advair Avoid any secondhand smoke exposure as much as possible

## 2016-05-07 NOTE — Progress Notes (Signed)
Maria Hinton      MRN# [id number] Maria Hinton [birth year]   CC: Chief Complaint  Patient presents with  . Follow-up    SMW/PFT results; no cough; SOB w/exertion;       Brief History: 70 yo female with COPD Stage B on Advair and spiriva. Quit smoking 07/2015.   Events since last clinic visit: She presents today for follow-up visit of her COPD. Patient is currently on Advair and Spiriva, which is providing good clinical benefit for her. Has moved out of her daughter's home. Now walking about with dog about 3 times a day. Mild sob, with but with overall good improvement.       Medication:    Current Outpatient Prescriptions:  .  albuterol (PROVENTIL HFA;VENTOLIN HFA) 108 (90 BASE) MCG/ACT inhaler, Inhale 2 puffs into the lungs every 6 (six) hours as needed for wheezing., Disp: 6.7 g, Rfl: 11 .  ALPRAZolam (XANAX) 0.25 MG tablet, Take 1 tablet (0.25 mg total) by mouth 2 (two) times daily as needed for anxiety., Disp: 45 tablet, Rfl: 5 .  aspirin EC 81 MG tablet, Take 81 mg by mouth daily., Disp: , Rfl:  .  atenolol (TENORMIN) 50 MG tablet, TAKE 1 TABLET (50 MG TOTAL) BY MOUTH 2 (TWO) TIMES DAILY., Disp: 180 tablet, Rfl: 1 .  atorvastatin (LIPITOR) 40 MG tablet, TAKE 1 TABLET BY MOUTH DAILY AT 6 PM., Disp: 90 tablet, Rfl: 3 .  Blood Glucose Monitoring Suppl (ONE TOUCH ULTRA SYSTEM KIT) W/DEVICE KIT, Pt needs OneTouch Ultra Blue glucometer dx 250.02, Disp: 1 each, Rfl: 0 .  diazepam (VALIUM) 10 MG tablet, Take 1 tablet (10 mg total) by mouth every 12 (twelve) hours as needed (muscle spasm)., Disp: 30 tablet, Rfl: 1 .  Fluticasone-Salmeterol (ADVAIR DISKUS) 500-50 MCG/DOSE AEPB, Inhale 1 puff into the lungs 2 (two) times daily., Disp: 60 each, Rfl: 0 .  glipiZIDE (GLUCOTROL) 10 MG tablet, TAKE 1 TABLET BY MOUTH 2 TIMES DAILY BEFORE A MEAL., Disp: 60 tablet, Rfl: 5 .  glucose blood (ONE TOUCH ULTRA TEST) test strip, Use as instructed bid, has OneTouch  Ultra Blue  dx: 250.02, Disp: 100 each, Rfl: 3 .  hydrochlorothiazide (HYDRODIURIL) 12.5 MG tablet, TAKE 1 TABLET BY MOUTH DAILY., Disp: 90 tablet, Rfl: 1 .  Lancets (ONETOUCH ULTRASOFT) lancets, Use as instructed bid dx 250.02, Disp: 100 each, Rfl: 3 .  lisinopril (PRINIVIL,ZESTRIL) 5 MG tablet, TAKE 1 TABLET (5 MG TOTAL) BY MOUTH DAILY., Disp: 90 tablet, Rfl: 3 .  meloxicam (MOBIC) 7.5 MG tablet, TAKE 1 TABLET BY MOUTH EVERY DAY, Disp: 30 tablet, Rfl: 5 .  metFORMIN (GLUCOPHAGE) 1000 MG tablet, TAKE 1 TABLET BY MOUTH 2 TIMES DAILY WITH A MEAL., Disp: 180 tablet, Rfl: 3 .  omeprazole (PRILOSEC) 20 MG capsule, TAKE ONE CAPSULE BY MOUTH EVERY DAY, Disp: 90 capsule, Rfl: 1 .  tiotropium (SPIRIVA) 18 MCG inhalation capsule, Place 1 capsule (18 mcg total) into inhaler and inhale daily., Disp: 30 capsule, Rfl: 12    Review of Systems  Constitutional: Negative for chills and fever.  HENT: Negative for hearing loss.   Eyes: Negative.  Negative for pain.  Respiratory: Negative for cough, sputum production, shortness of breath and wheezing.   Cardiovascular: Negative for chest pain and leg swelling.  Gastrointestinal: Negative for abdominal pain, heartburn and vomiting.  Genitourinary: Negative.   Musculoskeletal: Negative for myalgias and neck pain.  Skin: Negative for rash.  Neurological: Negative.  Negative for headaches.  Endo/Heme/Allergies: Negative.   Psychiatric/Behavioral: Negative.       Allergies:  Vicodin [hydrocodone-acetaminophen]  Physical Examination:  VS: BP 110/68 (BP Location: Left Arm, Cuff Size: Normal)   Pulse 64   Ht _0  (1.626 m)   Wt 169 lb (76.7 kg)   SpO2 98%   BMI 29.01 kg/m   General Appearance: No distress  HEENT: PERRLA, no ptosis, no other lesions noticed Pulmonary:good airway entry, no wheezes Cardiovascular:  Normal S1,S2.  No m/r/g.     Abdomen:Exam: Benign, Soft, non-tender, No masses  Skin:   warm, no rashes, no ecchymosis  Extremities:  normal, no cyanosis, clubbing, warm with normal capillary refill.       Assessment and Plan:70 yo F with COPD Stage B, seen for follow up visit, now with AECOPD COPD type B ([place]) Patient with history of COPD B, Moderate airflow limitation (GOLD STAGE I) - now with moderate improvement since quit smoking and moved out of smoking environment. Walking on on a regular basis - up to 3 times a day on occasion.  Currently on Advair and spiriva, and doing well.  6MWT - 124f/375m, lowest sat 97%, highest HR 104  PFTs 05/04/2016 FEV1 75%, FVC 76%, FEV/FVC 75%, RV 106%, TLC 94%, ERV 35%  Plan Continue with Spiriva and Advair Avoid any secondhand smoke exposure as much as possible           Updated Medication List Outpatient Encounter Prescriptions as of 05/07/2016  Medication Sig  . albuterol (PROVENTIL HFA;VENTOLIN HFA) 108 (90 BASE) MCG/ACT inhaler Inhale 2 puffs into the lungs every 6 (six) hours as needed for wheezing.  .Marland KitchenALPRAZolam (XANAX) 0.25 MG tablet Take 1 tablet (0.25 mg total) by mouth 2 (two) times daily as needed for anxiety.  .Marland Kitchenaspirin EC 81 MG tablet Take 81 mg by mouth daily.  .Marland Kitchenatenolol (TENORMIN) 50 MG tablet TAKE 1 TABLET (50 MG TOTAL) BY MOUTH 2 (TWO) TIMES DAILY.  .Marland Kitchenatorvastatin (LIPITOR) 40 MG tablet TAKE 1 TABLET BY MOUTH DAILY AT 6 PM.  . Blood Glucose Monitoring Suppl (ONE TOUCH ULTRA SYSTEM KIT) W/DEVICE KIT Pt needs OneTouch Ultra Blue glucometer dx 250.02  . diazepam (VALIUM) 10 MG tablet Take 1 tablet (10 mg total) by mouth every 12 (twelve) hours as needed (muscle spasm).  . Fluticasone-Salmeterol (ADVAIR DISKUS) 500-50 MCG/DOSE AEPB Inhale 1 puff into the lungs 2 (two) times daily.  .Marland KitchenglipiZIDE (GLUCOTROL) 10 MG tablet TAKE 1 TABLET BY MOUTH 2 TIMES DAILY BEFORE A MEAL.  .Marland Kitchenglucose blood (ONE TOUCH ULTRA TEST) test strip Use as instructed bid, has OneTouch Ultra Blue  dx: 250.02  . hydrochlorothiazide (HYDRODIURIL) 12.5 MG tablet TAKE 1 TABLET BY MOUTH  DAILY.  .Marland KitchenLancets (ONETOUCH ULTRASOFT) lancets Use as instructed bid dx 250.02  . lisinopril (PRINIVIL,ZESTRIL) 5 MG tablet TAKE 1 TABLET (5 MG TOTAL) BY MOUTH DAILY.  . meloxicam (MOBIC) 7.5 MG tablet TAKE 1 TABLET BY MOUTH EVERY DAY  . metFORMIN (GLUCOPHAGE) 1000 MG tablet TAKE 1 TABLET BY MOUTH 2 TIMES DAILY WITH A MEAL.  .Marland Kitchenomeprazole (PRILOSEC) 20 MG capsule TAKE ONE CAPSULE BY MOUTH EVERY DAY  . tiotropium (SPIRIVA) 18 MCG inhalation capsule Place 1 capsule (18 mcg total) into inhaler and inhale daily.  . [DISCONTINUED] moxifloxacin (AVELOX) 400 MG tablet Take 1 tablet (400 mg total) by mouth daily.  . [DISCONTINUED] predniSONE (STERAPRED UNI-PAK 21 TAB) 5 MG (21) TBPK tablet Take 10 mg by mouth daily.  . [DISCONTINUED] traMADol (Veatrice Bourbon  50 MG tablet Take 50 mg by mouth 4 (four) times daily as needed.   No facility-administered encounter medications on file as of 05/07/2016.     Orders for this visit: No orders of the defined types were placed in this encounter.   Thank  you for the visitation and for allowing  [facility] to assist in the care of your patient. Our recommendations are noted above.  Please contact us if we can be of further service.  Vilinda Boehringer, MD [facility] Office Number: [phone]

## 2016-05-25 ENCOUNTER — Ambulatory Visit: Payer: Medicare HMO | Admitting: Internal Medicine

## 2016-06-01 ENCOUNTER — Other Ambulatory Visit (INDEPENDENT_AMBULATORY_CARE_PROVIDER_SITE_OTHER): Payer: Medicare HMO

## 2016-06-01 DIAGNOSIS — I10 Essential (primary) hypertension: Secondary | ICD-10-CM | POA: Diagnosis not present

## 2016-06-01 DIAGNOSIS — E119 Type 2 diabetes mellitus without complications: Secondary | ICD-10-CM

## 2016-06-01 DIAGNOSIS — E7849 Other hyperlipidemia: Secondary | ICD-10-CM

## 2016-06-01 DIAGNOSIS — E784 Other hyperlipidemia: Secondary | ICD-10-CM | POA: Diagnosis not present

## 2016-06-01 LAB — CBC WITH DIFFERENTIAL/PLATELET
Basophils Absolute: 0 10*3/uL (ref 0.0–0.1)
Basophils Relative: 0.4 % (ref 0.0–3.0)
Eosinophils Absolute: 0.6 10*3/uL (ref 0.0–0.7)
Eosinophils Relative: 7.1 % — ABNORMAL HIGH (ref 0.0–5.0)
HCT: 41.3 % (ref 36.0–46.0)
Hemoglobin: 13.8 g/dL (ref 12.0–15.0)
Lymphocytes Relative: 24.3 % (ref 12.0–46.0)
Lymphs Abs: 2 10*3/uL (ref 0.7–4.0)
MCHC: 33.5 g/dL (ref 30.0–36.0)
MCV: 92.6 fl (ref 78.0–100.0)
Monocytes Absolute: 0.5 10*3/uL (ref 0.1–1.0)
Monocytes Relative: 5.9 % (ref 3.0–12.0)
Neutro Abs: 5.1 10*3/uL (ref 1.4–7.7)
Neutrophils Relative %: 62.3 % (ref 43.0–77.0)
Platelets: 280 10*3/uL (ref 150.0–400.0)
RBC: 4.45 Mil/uL (ref 3.87–5.11)
RDW: 13.4 % (ref 11.5–15.5)
WBC: 8.1 10*3/uL (ref 4.0–10.5)

## 2016-06-01 LAB — LIPID PANEL
Cholesterol: 240 mg/dL — ABNORMAL HIGH (ref 0–200)
HDL: 41.3 mg/dL (ref >39.00)
Total CHOL/HDL Ratio: 6
Triglycerides: 416 mg/dL — ABNORMAL HIGH (ref 0.0–149.0)

## 2016-06-01 LAB — MICROALBUMIN / CREATININE URINE RATIO
Creatinine,U: 258.3 mg/dL
Microalb Creat Ratio: 1 mg/g (ref 0.0–30.0)
Microalb, Ur: 2.6 mg/dL — ABNORMAL HIGH (ref 0.0–1.9)

## 2016-06-01 LAB — COMPREHENSIVE METABOLIC PANEL
ALT: 17 U/L (ref 0–35)
AST: 15 U/L (ref 0–37)
Albumin: 4.1 g/dL (ref 3.5–5.2)
Alkaline Phosphatase: 102 U/L (ref 39–117)
BUN: 17 mg/dL (ref 6–23)
CO2: 30 mEq/L (ref 19–32)
Calcium: 9.3 mg/dL (ref 8.4–10.5)
Chloride: 101 mEq/L (ref 96–112)
Creatinine, Ser: 1.03 mg/dL (ref 0.40–1.20)
GFR: 56.59 mL/min — ABNORMAL LOW (ref >60.00)
Glucose, Bld: 240 mg/dL — ABNORMAL HIGH (ref 70–99)
Potassium: 4.1 mEq/L (ref 3.5–5.1)
Sodium: 138 mEq/L (ref 135–145)
Total Bilirubin: 0.3 mg/dL (ref 0.2–1.2)
Total Protein: 7 g/dL (ref 6.0–8.3)

## 2016-06-01 LAB — HEMOGLOBIN A1C: Hgb A1c MFr Bld: 9.4 % — ABNORMAL HIGH (ref 4.6–6.5)

## 2016-06-01 LAB — LDL CHOLESTEROL, DIRECT: Direct LDL: 107 mg/dL

## 2016-06-03 ENCOUNTER — Encounter: Payer: Self-pay | Admitting: Internal Medicine

## 2016-06-03 ENCOUNTER — Ambulatory Visit (INDEPENDENT_AMBULATORY_CARE_PROVIDER_SITE_OTHER): Payer: Medicare HMO | Admitting: Internal Medicine

## 2016-06-03 DIAGNOSIS — M79671 Pain in right foot: Secondary | ICD-10-CM

## 2016-06-03 DIAGNOSIS — E119 Type 2 diabetes mellitus without complications: Secondary | ICD-10-CM | POA: Diagnosis not present

## 2016-06-03 DIAGNOSIS — Z23 Encounter for immunization: Secondary | ICD-10-CM | POA: Diagnosis not present

## 2016-06-03 DIAGNOSIS — R51 Headache: Secondary | ICD-10-CM

## 2016-06-03 DIAGNOSIS — I872 Venous insufficiency (chronic) (peripheral): Secondary | ICD-10-CM

## 2016-06-03 DIAGNOSIS — G4486 Cervicogenic headache: Secondary | ICD-10-CM

## 2016-06-03 DIAGNOSIS — E782 Mixed hyperlipidemia: Secondary | ICD-10-CM

## 2016-06-03 DIAGNOSIS — D492 Neoplasm of unspecified behavior of bone, soft tissue, and skin: Secondary | ICD-10-CM | POA: Diagnosis not present

## 2016-06-03 DIAGNOSIS — Z794 Long term (current) use of insulin: Secondary | ICD-10-CM

## 2016-06-03 MED ORDER — INSULIN DETEMIR 100 UNIT/ML FLEXPEN: 15.0000 [IU] | PEN_INJECTOR | Freq: Every day | SUBCUTANEOUS | 11 refills | Status: DC

## 2016-06-03 MED ORDER — INSULIN PEN NEEDLE 31G X 4 MM MISC: 1.0000 "application " | Freq: Every day | 0 refills | Status: DC

## 2016-06-03 NOTE — Patient Instructions (Addendum)
You can purchase stockings at [email or web address] for compression knee highs .    I recommend you see a podiatrist for your foot problem and a dermatologist for your skin lesions which may be skin cancer  We are starting Levemir for your elevated blood sugars .  Start with 15 units daily  We will adjust your dose every week based on your blood sugars, so please send me readings in a week

## 2016-06-03 NOTE — Progress Notes (Signed)
Subjective:  Patient ID: Maria Hinton, female    DOB: [birth year]  Age: 70 y.o. MRN: [id number]  CC: Diagnoses of Encounter for immunization, Type 2 diabetes mellitus without complication, with long-term current use of insulin ([place]), Skin neoplasm, Right foot pain, Venous (peripheral) insufficiency, Headache, cervicogenic, and Mixed hyperlipidemia were pertinent to this visit.  HPI Maria Hinton presents for 3 month follow up on diabetes.  .  Patient has NOT had an eye exam in the last 12 months and checks feet regularly for signs of infection.  Patient does not walk barefoot outside,  And denies any numbness , tingling or burning of feet but has developed a soft mass  under her 2nd MT. Patient is up to date on all recommended vaccinations  DM: she Has  lost control of her condition.  Not checking sugars very often,  And fastings have been as high as 225  Previous  a1c was 7.1 prior to yesterday   Lab Results  Component Value Date   HGBA1C 9.4 (H) 06/01/2016   diet reviewed:  Bkfast 10:30 - 11:00 cereal or sandwich.   [place] again at 6:30pm  cereal sandwhich  After dinner munching on starchy snacks.   Fastings are rarely under 225   Foot problems  On the right  and a 2 week history of ankle /foot  Swelling , no history of trauma,  No recent travel,  No calf pain   Skin lesions on scalp  And forehead.  Have been present for several months,  Recurrent scaling.   Still having episodes of sharp pain on lett side of head that last several minutes and are so severe in pain that she has to lie down.  Occur at rest.  Occurring several times per week  But never last more than 15 minutes.  Has cervical DDD    Has bilateral cataracts and is behind in her annual eye exam.    Needs refills on meloxicam , atenolol, atorvastatin , glipizide, hctz ,  Lisinopril,  Meloxicam, m,  And omeprazole bc she is switching pharmacies,  Needs a  90 day supply   She had an episode of right foot swelling 2-34  weeks ago   Outpatient Medications Prior to Visit  Medication Sig Dispense Refill  . albuterol (PROVENTIL HFA;VENTOLIN HFA) 108 (90 BASE) MCG/ACT inhaler Inhale 2 puffs into the lungs every 6 (six) hours as needed for wheezing. 6.7 g 11  . ALPRAZolam (XANAX) 0.25 MG tablet Take 1 tablet (0.25 mg total) by mouth 2 (two) times daily as needed for anxiety. 45 tablet 5  . aspirin EC 81 MG tablet Take 81 mg by mouth daily.    . Blood Glucose Monitoring Suppl (ONE TOUCH ULTRA SYSTEM KIT) W/DEVICE KIT Pt needs OneTouch Ultra Blue glucometer dx 250.02 1 each 0  . diazepam (VALIUM) 10 MG tablet Take 1 tablet (10 mg total) by mouth every 12 (twelve) hours as needed (muscle spasm). 30 tablet 1  . Fluticasone-Salmeterol (ADVAIR DISKUS) 500-50 MCG/DOSE AEPB Inhale 1 puff into the lungs 2 (two) times daily. 60 each 0  . glucose blood (ONE TOUCH ULTRA TEST) test strip Use as instructed bid, has OneTouch Ultra Blue  dx: 250.02 100 each 3  . Lancets (ONETOUCH ULTRASOFT) lancets Use as instructed bid dx 250.02 100 each 3  . omeprazole (PRILOSEC) 20 MG capsule TAKE ONE CAPSULE BY MOUTH EVERY DAY 90 capsule 1  . tiotropium (SPIRIVA) 18 MCG inhalation capsule Place 1 capsule (18 mcg  total) into inhaler and inhale daily. 30 capsule 12  . atenolol (TENORMIN) 50 MG tablet TAKE 1 TABLET (50 MG TOTAL) BY MOUTH 2 (TWO) TIMES DAILY. 180 tablet 1  . atorvastatin (LIPITOR) 40 MG tablet TAKE 1 TABLET BY MOUTH DAILY AT 6 PM. 90 tablet 3  . glipiZIDE (GLUCOTROL) 10 MG tablet TAKE 1 TABLET BY MOUTH 2 TIMES DAILY BEFORE A MEAL. 60 tablet 5  . hydrochlorothiazide (HYDRODIURIL) 12.5 MG tablet TAKE 1 TABLET BY MOUTH DAILY. 90 tablet 1  . lisinopril (PRINIVIL,ZESTRIL) 5 MG tablet TAKE 1 TABLET (5 MG TOTAL) BY MOUTH DAILY. 90 tablet 3  . meloxicam (MOBIC) 7.5 MG tablet TAKE 1 TABLET BY MOUTH EVERY DAY 30 tablet 5  . metFORMIN (GLUCOPHAGE) 1000 MG tablet TAKE 1 TABLET BY MOUTH 2 TIMES DAILY WITH A MEAL. 180 tablet 3   No  facility-administered medications prior to visit.     Review of Systems;  Patient denies headache, fevers, malaise, unintentional weight loss, skin rash, eye pain, sinus congestion and sinus pain, sore throat, dysphagia,  hemoptysis , cough, dyspnea, wheezing, chest pain, palpitations, orthopnea,  abdominal pain, nausea, melena, diarrhea, constipation, flank pain, dysuria, hematuria, urinary  Frequency, nocturia, numbness, tingling, seizures,  Focal weakness, Loss of consciousness,  Tremor, insomnia, depression, anxiety, and suicidal ideation.      Objective:  BP 120/74   Pulse 66   Temp 98.1 F (36.7 C) (Oral)   Resp 12   Ht 5' 4"  (1.626 m)   Wt 170 lb (77.1 kg)   SpO2 96%   BMI 29.18 kg/m   BP Readings from Last 3 Encounters:  06/03/16 120/74  05/07/16 110/68  01/30/16 120/68    Wt Readings from Last 3 Encounters:  06/03/16 170 lb (77.1 kg)  05/07/16 169 lb (76.7 kg)  01/30/16 163 lb 12.8 oz (74.3 kg)    General appearance: alert, cooperative and appears stated age Ears: normal TM's and external ear canals both ears Throat: lips, mucosa, and tongue normal; teeth and gums normal Neck: no adenopathy, no carotid bruit, supple, symmetrical, trachea midline and thyroid not enlarged, symmetric, no tenderness/mass/nodules Back: symmetric, no curvature. ROM normal. No CVA tenderness. Lungs: clear to auscultation bilaterally Heart: regular rate and rhythm, S1, S2 normal, no murmur, click, rub or gallop Abdomen: soft, non-tender; bowel sounds normal; no masses,  no organomegaly Pulses: 2+ and symmetric Skin: Skin color, texture, turgor normal. No rashes or lesions Lymph nodes: Cervical, supraclavicular, and axillary nodes normal.  Lab Results  Component Value Date   HGBA1C 9.4 (H) 06/01/2016   HGBA1C 7.1 (H) 01/30/2016   HGBA1C 7.2 (H) 10/29/2015    Lab Results  Component Value Date   CREATININE 1.03 06/01/2016   CREATININE 1.00 01/30/2016   CREATININE 0.97 10/29/2015      Lab Results  Component Value Date   WBC 8.1 06/01/2016   HGB 13.8 06/01/2016   HCT 41.3 06/01/2016   PLT 280.0 06/01/2016   GLUCOSE 240 (H) 06/01/2016   CHOL 240 (H) 06/01/2016   TRIG (H) 06/01/2016    416.0 Triglyceride is over 400; calculations on Lipids are invalid.   HDL 41.30 06/01/2016   LDLDIRECT 107.0 06/01/2016   LDLCALC 70 01/16/2015   ALT 17 06/01/2016   AST 15 06/01/2016   NA 138 06/01/2016   K 4.1 06/01/2016   CL 101 06/01/2016   CREATININE 1.03 06/01/2016   BUN 17 06/01/2016   CO2 30 06/01/2016   TSH 1.31 11/28/2013   INR 0.9 11/21/2012  HGBA1C 9.4 (H) 06/01/2016   MICROALBUR 2.6 (H) 06/01/2016    Dg Chest 2 View  Result Date: 08/05/2015 CLINICAL DATA:  Cough and shortness of breath for the past 2 weeks, history of [place] OPD, former smoker. EXAM: CHEST  2 VIEW COMPARISON:  PA and lateral chest x-ray of Nov 21, 2012 FINDINGS: The lungs are mildly hyperinflated with hemidiaphragm flattening. There is no focal infiltrate. There is no pleural effusion. The heart and pulmonary vascularity are normal. The mediastinum is normal in width. There is surgical absence of the posterior aspects of the left eighth and ninth ribs. There is wire suture present over the left lower lateral hemi thorax. There is multilevel degenerative disc disease of the thoracic spine. IMPRESSION: COPD. There is no pneumonia, CHF, nor other acute cardiopulmonary disease. Electronically Signed   By: David  Martinique M.D.   On: 08/05/2015 16:34    Assessment & Plan:   Problem List Items Addressed This Visit    DM type 2 (diabetes mellitus, type 2) ([place])    Uncontrolled currently.  Adding Levemir 15 units daily.   Dietary changes advised       Relevant Medications   Insulin Detemir (LEVEMIR FLEXPEN) 100 UNIT/ML Pen   Headache, cervicogenic    Recurrent  episodes of neuralgia to left side of scalp which are self limiting.  MRT and CTA from 2015 reviewed and patient reassured that she does not  have an aneurysm      HLD (hyperlipidemia)    LDL Is  controlled on current statin therapy,  for triglcyeries should improve with better   Control of diabetes, diet and exercise. .   Liver enzymes are normal , no changes today.  Lab Results  Component Value Date   CHOL 240 (H) 06/01/2016   HDL 41.30 06/01/2016   LDLCALC 70 01/16/2015   LDLDIRECT 107.0 06/01/2016   TRIG (H) 06/01/2016    416.0 Triglyceride is over 400; calculations on Lipids are invalid.   CHOLHDL 6 06/01/2016    Lab Results  Component Value Date   ALT 17 06/01/2016   AST 15 06/01/2016   ALKPHOS 102 06/01/2016   BILITOT 0.3 06/01/2016             Right foot pain    Referral to podiatry needed for recurrent fleshy mess under 2nd MT      Relevant Orders   Ambulatory referral to Podiatry   Skin neoplasm    Referral to [facility] dermatology for evaluation of facial lesion       Relevant Orders   Ambulatory referral to Dermatology   Venous (peripheral) insufficiency    Based on exam, with early venous stasis changes.  Compression stockings advised.        Other Visit Diagnoses    Encounter for immunization       Relevant Orders   Flu vaccine HIGH DOSE PF (Completed)     A total of 40 minutes was spent with patient more than half of which was spent in counseling patient on the above mentioned issues , reviewing and explaining recent labs and imaging studies done, and coordination of care. I am having Ms. Mungia start on Insulin Detemir and Insulin Pen Needle. I am also having her maintain her glucose blood, ONE TOUCH ULTRA SYSTEM KIT, onetouch ultrasoft, albuterol, diazepam, aspirin EC, ALPRAZolam, omeprazole, Fluticasone-Salmeterol, and tiotropium.  Meds ordered this encounter  Medications  . Insulin Detemir (LEVEMIR FLEXPEN) 100 UNIT/ML Pen    Sig: Inject 15 Units into  the skin daily at 10 pm.    Dispense:  15 mL    Refill:  11  . Insulin Pen Needle (EXEL COMFORT POINT PEN NEEDLE) 31G X 4 MM  MISC    Sig: 1 application by Does not apply route daily.    Dispense:  100 each    Refill:  0    There are no discontinued medications.  Follow-up: Return in about 3 months (around 09/02/2016) for follow up diabetes.   Crecencio Mc, MD

## 2016-06-05 ENCOUNTER — Telehealth: Payer: Self-pay

## 2016-06-05 ENCOUNTER — Other Ambulatory Visit: Payer: Self-pay | Admitting: *Deleted

## 2016-06-05 MED ORDER — MELOXICAM 7.5 MG PO TABS: 7.5000 mg | ORAL_TABLET | Freq: Every day | ORAL | 3 refills | Status: DC

## 2016-06-05 MED ORDER — GLIPIZIDE 10 MG PO TABS: ORAL_TABLET | ORAL | 3 refills | Status: DC

## 2016-06-05 MED ORDER — LISINOPRIL 5 MG PO TABS: ORAL_TABLET | ORAL | 3 refills | Status: DC

## 2016-06-05 MED ORDER — ATENOLOL 50 MG PO TABS: ORAL_TABLET | ORAL | 3 refills | Status: DC

## 2016-06-05 MED ORDER — HYDROCHLOROTHIAZIDE 12.5 MG PO TABS: 12.5000 mg | ORAL_TABLET | Freq: Every day | ORAL | 3 refills | Status: DC

## 2016-06-05 MED ORDER — METFORMIN HCL 1000 MG PO TABS: ORAL_TABLET | ORAL | 3 refills | Status: DC

## 2016-06-05 MED ORDER — ATORVASTATIN CALCIUM 40 MG PO TABS
ORAL_TABLET | ORAL | 3 refills | Status: DC
Start: 2016-06-05 — End: 2016-06-05

## 2016-06-05 MED ORDER — ATORVASTATIN CALCIUM 40 MG PO TABS: ORAL_TABLET | ORAL | 3 refills | Status: DC

## 2016-06-05 NOTE — Progress Notes (Unsigned)
Refills printed.  

## 2016-06-05 NOTE — Telephone Encounter (Signed)
Called and advised pt she can pick up prescriptions.

## 2016-06-06 DIAGNOSIS — M79671 Pain in right foot: Secondary | ICD-10-CM | POA: Insufficient documentation

## 2016-06-06 DIAGNOSIS — I872 Venous insufficiency (chronic) (peripheral): Secondary | ICD-10-CM | POA: Insufficient documentation

## 2016-06-06 DIAGNOSIS — D492 Neoplasm of unspecified behavior of bone, soft tissue, and skin: Secondary | ICD-10-CM | POA: Insufficient documentation

## 2016-06-06 NOTE — Assessment & Plan Note (Signed)
Referral to podiatry needed for recurrent fleshy mess under 2nd MT

## 2016-06-06 NOTE — Assessment & Plan Note (Signed)
LDL Is  controlled on current statin therapy,  for triglcyeries should improve with better   Control of diabetes, diet and exercise. .   Liver enzymes are normal , no changes today.  Lab Results  Component Value Date   CHOL 240 (H) 06/01/2016   HDL 41.30 06/01/2016   LDLCALC 70 01/16/2015   LDLDIRECT 107.0 06/01/2016   TRIG (H) 06/01/2016    416.0 Triglyceride is over 400; calculations on Lipids are invalid.   CHOLHDL 6 06/01/2016    Lab Results  Component Value Date   ALT 17 06/01/2016   AST 15 06/01/2016   ALKPHOS 102 06/01/2016   BILITOT 0.3 06/01/2016

## 2016-06-06 NOTE — Assessment & Plan Note (Signed)
Uncontrolled currently.  Adding Levemir 15 units daily.   Dietary changes advised

## 2016-06-06 NOTE — Assessment & Plan Note (Signed)
Recurrent  episodes of neuralgia to left side of scalp which are self limiting.  MRT and CTA from 2015 reviewed and patient reassured that she does not have an aneurysm

## 2016-06-06 NOTE — Assessment & Plan Note (Signed)
Based on exam, with early venous stasis changes.  Compression stockings advised.

## 2016-06-06 NOTE — Assessment & Plan Note (Signed)
Referral to [facility] dermatology for evaluation of facial lesion

## 2016-06-06 NOTE — Assessment & Plan Note (Signed)
Uncontrolled due to dietary noncompliance.  Adding Levemir 15 units daily

## 2016-06-16 ENCOUNTER — Encounter: Payer: Self-pay | Admitting: Internal Medicine

## 2016-06-17 ENCOUNTER — Other Ambulatory Visit: Payer: Self-pay | Admitting: Internal Medicine

## 2016-06-18 NOTE — Telephone Encounter (Signed)
Last filled 05/15/16. Last OV 06/03/16.

## 2016-06-24 ENCOUNTER — Encounter: Payer: Self-pay | Admitting: Internal Medicine

## 2016-06-24 LAB — HM DIABETES EYE EXAM

## 2016-06-30 ENCOUNTER — Ambulatory Visit: Payer: Self-pay | Admitting: Podiatry

## 2016-06-30 ENCOUNTER — Encounter: Payer: Self-pay | Admitting: Podiatry

## 2016-06-30 ENCOUNTER — Ambulatory Visit (INDEPENDENT_AMBULATORY_CARE_PROVIDER_SITE_OTHER): Payer: Medicare HMO

## 2016-06-30 ENCOUNTER — Ambulatory Visit (INDEPENDENT_AMBULATORY_CARE_PROVIDER_SITE_OTHER): Payer: Medicare HMO | Admitting: Podiatry

## 2016-06-30 DIAGNOSIS — L608 Other nail disorders: Secondary | ICD-10-CM

## 2016-06-30 DIAGNOSIS — L603 Nail dystrophy: Secondary | ICD-10-CM

## 2016-06-30 DIAGNOSIS — M79672 Pain in left foot: Secondary | ICD-10-CM

## 2016-06-30 DIAGNOSIS — M79671 Pain in right foot: Secondary | ICD-10-CM

## 2016-06-30 DIAGNOSIS — R52 Pain, unspecified: Secondary | ICD-10-CM

## 2016-06-30 DIAGNOSIS — B351 Tinea unguium: Secondary | ICD-10-CM | POA: Diagnosis not present

## 2016-06-30 DIAGNOSIS — E0843 Diabetes mellitus due to underlying condition with diabetic autonomic (poly)neuropathy: Secondary | ICD-10-CM

## 2016-06-30 DIAGNOSIS — M79609 Pain in unspecified limb: Secondary | ICD-10-CM

## 2016-07-04 NOTE — Progress Notes (Signed)
SUBJECTIVE Patient with a history of diabetes mellitus presents to office today complaining of elongated, thickened nails. Pain while ambulating in shoes. Patient is unable to trim their own nails.   Allergies  Allergen Reactions  . Vicodin [Hydrocodone-Acetaminophen] Other (See Comments)    Insomnia     OBJECTIVE General Patient is awake, alert, and oriented x 3 and in no acute distress. Derm Skin is dry and supple bilateral. Negative open lesions or macerations. Remaining integument unremarkable. Nails are tender, long, thickened and dystrophic with subungual debris, consistent with onychomycosis, 1-5 bilateral. No signs of infection noted. Vasc  DP and PT pedal pulses palpable bilaterally. Temperature gradient within normal limits.  Neuro Epicritic and protective threshold sensation diminished bilaterally.  Musculoskeletal Exam No symptomatic pedal deformities noted bilateral. Muscular strength within normal limits. Nonsymptomatic palpable masses along the medial longitudinal arch of the bilateral feet consistent with a plantar fibroma.  ASSESSMENT 1. Diabetes Mellitus w/ peripheral neuropathy 2. Onychomycosis of nail due to dermatophyte bilateral 3. Pain in foot bilateral 4. Plantar fascial fibromatosis bilateral  PLAN OF CARE 1. Patient evaluated today. 2. Instructed to maintain good pedal hygiene and foot care. Stressed importance of controlling blood sugar.  3. Mechanical debridement of nails 1-5 bilaterally performed using a nail nipper. Filed with dremel without incident.  4. Return to clinic in 3 mos.     Edrick Kins, DPM [facility]  Dr. Edrick Kins, [address]                                        [place], [place] [address]                Office [phone]  Fax [phone]

## 2016-07-08 ENCOUNTER — Telehealth: Payer: Self-pay | Admitting: Internal Medicine

## 2016-07-08 DIAGNOSIS — Z794 Long term (current) use of insulin: Secondary | ICD-10-CM

## 2016-07-08 DIAGNOSIS — E782 Mixed hyperlipidemia: Secondary | ICD-10-CM

## 2016-07-08 DIAGNOSIS — E119 Type 2 diabetes mellitus without complications: Secondary | ICD-10-CM

## 2016-07-08 NOTE — Telephone Encounter (Signed)
Pt called and set up a follow up appt for 09/07/16 @ 9 am and wanted to know if she needs to have lab work done before. Please advise, thank you!

## 2016-08-03 ENCOUNTER — Encounter: Payer: Self-pay | Admitting: Internal Medicine

## 2016-08-03 ENCOUNTER — Ambulatory Visit (INDEPENDENT_AMBULATORY_CARE_PROVIDER_SITE_OTHER): Payer: Medicare HMO | Admitting: Internal Medicine

## 2016-08-03 ENCOUNTER — Ambulatory Visit (INDEPENDENT_AMBULATORY_CARE_PROVIDER_SITE_OTHER): Payer: Medicare HMO

## 2016-08-03 VITALS — BP 130/84 | HR 86 | Temp 98.9°F | Resp 16 | Wt 167.6 lb

## 2016-08-03 DIAGNOSIS — J441 Chronic obstructive pulmonary disease with (acute) exacerbation: Secondary | ICD-10-CM | POA: Diagnosis not present

## 2016-08-03 DIAGNOSIS — R05 Cough: Secondary | ICD-10-CM

## 2016-08-03 DIAGNOSIS — R062 Wheezing: Secondary | ICD-10-CM | POA: Diagnosis not present

## 2016-08-03 DIAGNOSIS — R52 Pain, unspecified: Secondary | ICD-10-CM

## 2016-08-03 DIAGNOSIS — J111 Influenza due to unidentified influenza virus with other respiratory manifestations: Secondary | ICD-10-CM

## 2016-08-03 DIAGNOSIS — R058 Other specified cough: Secondary | ICD-10-CM

## 2016-08-03 LAB — POCT INFLUENZA A/B
Influenza A, POC: NEGATIVE
Influenza B, POC: NEGATIVE

## 2016-08-03 MED ORDER — PREDNISONE 10 MG PO TABS: ORAL_TABLET | ORAL | 0 refills | Status: DC

## 2016-08-03 MED ORDER — OSELTAMIVIR PHOSPHATE 75 MG PO CAPS: 75.0000 mg | ORAL_CAPSULE | Freq: Two times a day (BID) | ORAL | 0 refills | Status: DC

## 2016-08-03 MED ORDER — METHYLPREDNISOLONE ACETATE 40 MG/ML IJ SUSP
40.0000 mg | Freq: Once | INTRAMUSCULAR | Status: AC
  Administered 2016-08-03: 40 mg via INTRAMUSCULAR

## 2016-08-03 MED ORDER — ALBUTEROL SULFATE (2.5 MG/3ML) 0.083% IN NEBU
2.5000 mg | INHALATION_SOLUTION | Freq: Once | RESPIRATORY_TRACT | Status: AC
  Administered 2016-08-03: 2.5 mg via RESPIRATORY_TRACT

## 2016-08-03 MED ORDER — PROMETHAZINE-DM 6.25-15 MG/5ML PO SYRP: 5.0000 mL | ORAL_SOLUTION | Freq: Four times a day (QID) | ORAL | 0 refills | Status: DC | PRN

## 2016-08-03 MED ORDER — AZITHROMYCIN 500 MG PO TABS: 500.0000 mg | ORAL_TABLET | Freq: Every day | ORAL | 0 refills | Status: DC

## 2016-08-03 NOTE — Patient Instructions (Addendum)
I  am treating you for the flu based on your symptoms .  Start the tamiflu, azithromycin and cough syrup tonight .  You can start the prednisone taper in the morning.  You may use the cough syrup 4 times daily and your albuterol inhaler every 4 hours if needed (2 puffs)   You can use immodium for the diarrhea  (available otc)

## 2016-08-03 NOTE — Progress Notes (Signed)
Subjective:  Patient ID: Maria Hinton, female    DOB: June 26, 1946  Age: 71 y.o. MRN: [id number]  CC: The primary encounter diagnosis was Wheezing. Diagnoses of Cough with sputum, Body aches, COPD exacerbation ([place]), and Bronchitis with flu were also pertinent to this visit.  HPI Maria Hinton presents for  Evaluation for sinus congestion, persistent productive cough with post tussive emesis and  body aches since Saturday.  Had a fever on Saturday , but has not had any since , but she has been taking aleve for body aches twice daily Some loose stools since Saturday .  Using albuterol for  Wheezing .  No known flu contacts.  Son has had a viral [place] but his flu test was negative so he was not treated.  Patient has not been this sick in many years.      Outpatient Medications Prior to Visit  Medication Sig Dispense Refill  . albuterol (PROVENTIL HFA;VENTOLIN HFA) 108 (90 BASE) MCG/ACT inhaler Inhale 2 puffs into the lungs every 6 (six) hours as needed for wheezing. 6.7 g 11  . ALPRAZolam (XANAX) 0.25 MG tablet TAKE 1 TABLET BY MOUTH TWICE A DAY AS NEEDED 45 tablet 5  . aspirin EC 81 MG tablet Take 81 mg by mouth daily.    Marland Kitchen atenolol (TENORMIN) 50 MG tablet TAKE 1 TABLET (50 MG TOTAL) BY MOUTH 2 (TWO) TIMES DAILY. 180 tablet 3  . atorvastatin (LIPITOR) 40 MG tablet TAKE 1 TABLET BY MOUTH DAILY AT 6 PM. 90 tablet 3  . Blood Glucose Monitoring Suppl (ONE TOUCH ULTRA SYSTEM KIT) W/DEVICE KIT Pt needs OneTouch Ultra Blue glucometer dx 250.02 1 each 0  . diazepam (VALIUM) 10 MG tablet Take 1 tablet (10 mg total) by mouth every 12 (twelve) hours as needed (muscle spasm). 30 tablet 1  . Fluticasone-Salmeterol (ADVAIR DISKUS) 500-50 MCG/DOSE AEPB Inhale 1 puff into the lungs 2 (two) times daily. 60 each 0  . glipiZIDE (GLUCOTROL) 10 MG tablet TAKE 1 TABLET BY MOUTH 2 TIMES DAILY BEFORE A MEAL. 180 tablet 3  . glucose blood (ONE TOUCH ULTRA TEST) test strip Use as instructed bid, has OneTouch Ultra Blue   dx: 250.02 100 each 3  . hydrochlorothiazide (HYDRODIURIL) 12.5 MG tablet Take 1 tablet (12.5 mg total) by mouth daily. 90 tablet 3  . Insulin Detemir (LEVEMIR FLEXPEN) 100 UNIT/ML Pen Inject 15 Units into the skin daily at 10 pm. 15 mL 11  . Insulin Pen Needle (EXEL COMFORT POINT PEN NEEDLE) 31G X 4 MM MISC 1 application by Does not apply route daily. 100 each 0  . Lancets (ONETOUCH ULTRASOFT) lancets Use as instructed bid dx 250.02 100 each 3  . lisinopril (PRINIVIL,ZESTRIL) 5 MG tablet TAKE 1 TABLET (5 MG TOTAL) BY MOUTH DAILY. 90 tablet 3  . meloxicam (MOBIC) 7.5 MG tablet Take 1 tablet (7.5 mg total) by mouth daily. 90 tablet 3  . metFORMIN (GLUCOPHAGE) 1000 MG tablet TAKE 1 TABLET BY MOUTH 2 TIMES DAILY WITH A MEAL. 180 tablet 3  . omeprazole (PRILOSEC) 20 MG capsule TAKE ONE CAPSULE BY MOUTH EVERY DAY 90 capsule 1  . tiotropium (SPIRIVA) 18 MCG inhalation capsule Place 1 capsule (18 mcg total) into inhaler and inhale daily. 30 capsule 12   No facility-administered medications prior to visit.     Review of Systems;  Patient denies unintentional weight loss, skin rash, eye pain,  dysphagia,  hemoptysis ,  palpitations, orthopnea, edema, abdominal pain, nausea, melena,constipation, flank pain,  dysuria, hematuria, urinary  Frequency, nocturia, numbness, tingling, seizures,  Focal weakness, Loss of consciousness,  Tremor, insomnia, depression, anxiety, and suicidal ideation.      Objective:  BP 130/84   Pulse 86   Temp 98.9 F (37.2 C) (Oral)   Resp 16   Wt 167 lb 9.6 oz (76 kg)   SpO2 97%   BMI 28.77 kg/m   BP Readings from Last 3 Encounters:  08/03/16 130/84  06/03/16 120/74  05/07/16 110/68    Wt Readings from Last 3 Encounters:  08/03/16 167 lb 9.6 oz (76 kg)  06/03/16 170 lb (77.1 kg)  05/07/16 169 lb (76.7 kg)    General appearance: alert, cooperative and appears ill Earrs: normal TM's and external ear canals both ears Throat: lips, mucosa, and tongue normal;  teeth and gums normal Neck: cervical  adenopathy, no carotid bruit, supple, symmetrical, trachea midline and thyroid not enlarged, symmetric, no tenderness/mass/nodules Back: symmetric, no curvature. ROM normal. No CVA tenderness. Lungs: bilateral wheezing and ronchi,  Good air movement y Heart: regular rate and rhythm, S1, S2 normal, no murmur, click, rub or gallop Abdomen: soft, non-tender; bowel sounds normal; no masses,  no organomegaly Pulses: 2+ and symmetric Skin: Skin color, texture, turgor normal. No rashes or lesions Lymph nodes: , supraclavicular, and axillary nodes normal.  Lab Results  Component Value Date   HGBA1C 9.4 (H) 06/01/2016   HGBA1C 7.1 (H) 01/30/2016   HGBA1C 7.2 (H) 10/29/2015    Lab Results  Component Value Date   CREATININE 1.03 06/01/2016   CREATININE 1.00 01/30/2016   CREATININE 0.97 10/29/2015    Lab Results  Component Value Date   WBC 8.1 06/01/2016   HGB 13.8 06/01/2016   HCT 41.3 06/01/2016   PLT 280.0 06/01/2016   GLUCOSE 240 (H) 06/01/2016   CHOL 240 (H) 06/01/2016   TRIG (H) 06/01/2016    416.0 Triglyceride is over 400; calculations on Lipids are invalid.   HDL 41.30 06/01/2016   LDLDIRECT 107.0 06/01/2016   LDLCALC 70 01/16/2015   ALT 17 06/01/2016   AST 15 06/01/2016   NA 138 06/01/2016   K 4.1 06/01/2016   CL 101 06/01/2016   CREATININE 1.03 06/01/2016   BUN 17 06/01/2016   CO2 30 06/01/2016   TSH 1.31 11/28/2013   INR 0.9 11/21/2012   HGBA1C 9.4 (H) 06/01/2016   MICROALBUR 2.6 (H) 06/01/2016    Assessment & Plan:   Problem List Items Addressed This Visit    Bronchitis with flu    Treating with tamiflu regardless of negative POCT given history and exam       Relevant Medications   oseltamivir (TAMIFLU) 75 MG capsule   azithromycin (ZITHROMAX) 500 MG tablet   COPD exacerbation ([place])    Triggered by flu like illness.    Chest x ray done today (not read at time of dictation) shows a few air bronchograms and a lingular  infiltrate suggestive of pneumonia .Azithromycin, prednisone taper added.  Given IM steroids and albuterol neb in house.       Relevant Medications   promethazine-dextromethorphan (PROMETHAZINE-DM) 6.25-15 MG/5ML syrup   predniSONE (DELTASONE) 10 MG tablet   methylPREDNISolone acetate (DEPO-MEDROL) injection 40 mg (Completed)   albuterol (PROVENTIL) (2.5 MG/3ML) 0.083% nebulizer solution 2.5 mg (Completed)   azithromycin (ZITHROMAX) 500 MG tablet    Other Visit Diagnoses    Wheezing    -  Primary   Relevant Medications   methylPREDNISolone acetate (DEPO-MEDROL) injection 40 mg (Completed)  albuterol (PROVENTIL) (2.5 MG/3ML) 0.083% nebulizer solution 2.5 mg (Completed)   Other Relevant Orders   DG Chest 2 View   Cough with sputum       Relevant Orders   DG Chest 2 View   CBC with Differential/Platelet   Legionella Antigen, Urine   Body aches       Relevant Orders   POCT Influenza A/B (Completed)      I am having Ms. Leonhardt start on promethazine-dextromethorphan, oseltamivir, predniSONE, and azithromycin. I am also having her maintain her glucose blood, ONE TOUCH ULTRA SYSTEM KIT, onetouch ultrasoft, albuterol, diazepam, aspirin EC, omeprazole, Fluticasone-Salmeterol, tiotropium, Insulin Detemir, Insulin Pen Needle, atenolol, atorvastatin, glipiZIDE, hydrochlorothiazide, lisinopril, meloxicam, metFORMIN, and ALPRAZolam. We administered methylPREDNISolone acetate and albuterol.  Meds ordered this encounter  Medications  . promethazine-dextromethorphan (PROMETHAZINE-DM) 6.25-15 MG/5ML syrup    Sig: Take 5 mLs by mouth 4 (four) times daily as needed for cough.    Dispense:  180 mL    Refill:  0  . oseltamivir (TAMIFLU) 75 MG capsule    Sig: Take 1 capsule (75 mg total) by mouth 2 (two) times daily.    Dispense:  10 capsule    Refill:  0  . predniSONE (DELTASONE) 10 MG tablet    Sig: 6 tablets on Day 1 , then reduce by 1 tablet daily until gone    Dispense:  21 tablet     Refill:  0  . methylPREDNISolone acetate (DEPO-MEDROL) injection 40 mg  . albuterol (PROVENTIL) (2.5 MG/3ML) 0.083% nebulizer solution 2.5 mg  . azithromycin (ZITHROMAX) 500 MG tablet    Sig: Take 1 tablet (500 mg total) by mouth daily.    Dispense:  7 tablet    Refill:  0    There are no discontinued medications.  Follow-up: No Follow-up on file.   Crecencio Mc, MD

## 2016-08-04 ENCOUNTER — Encounter: Payer: Self-pay | Admitting: Internal Medicine

## 2016-08-04 DIAGNOSIS — J40 Bronchitis, not specified as acute or chronic: Secondary | ICD-10-CM | POA: Insufficient documentation

## 2016-08-04 DIAGNOSIS — J111 Influenza due to unidentified influenza virus with other respiratory manifestations: Secondary | ICD-10-CM | POA: Insufficient documentation

## 2016-08-04 NOTE — Assessment & Plan Note (Signed)
Triggered by flu like illness.    Chest x ray done today (not read at time of dictation) shows a few air bronchograms and a lingular infiltrate suggestive of pneumonia .Azithromycin, prednisone taper added.  Given IM steroids and albuterol neb in house.

## 2016-08-04 NOTE — Assessment & Plan Note (Signed)
Treating with tamiflu regardless of negative POCT given history and exam

## 2016-08-05 LAB — LEGIONELLA ANTIGEN, URINE
Result - LGAGUR: DETECTED
Result - LGAGUR: NEGATIVE
Result - LGAGUR: NOT DETECTED

## 2016-08-27 DIAGNOSIS — D2261 Melanocytic nevi of right upper limb, including shoulder: Secondary | ICD-10-CM | POA: Diagnosis not present

## 2016-08-27 DIAGNOSIS — D225 Melanocytic nevi of trunk: Secondary | ICD-10-CM | POA: Diagnosis not present

## 2016-08-27 DIAGNOSIS — D2272 Melanocytic nevi of left lower limb, including hip: Secondary | ICD-10-CM | POA: Diagnosis not present

## 2016-08-27 DIAGNOSIS — D2271 Melanocytic nevi of right lower limb, including hip: Secondary | ICD-10-CM | POA: Diagnosis not present

## 2016-08-27 DIAGNOSIS — X32XXXA Exposure to sunlight, initial encounter: Secondary | ICD-10-CM | POA: Diagnosis not present

## 2016-08-27 DIAGNOSIS — L570 Actinic keratosis: Secondary | ICD-10-CM | POA: Diagnosis not present

## 2016-09-07 ENCOUNTER — Ambulatory Visit (INDEPENDENT_AMBULATORY_CARE_PROVIDER_SITE_OTHER): Payer: Medicare HMO | Admitting: Internal Medicine

## 2016-09-07 ENCOUNTER — Encounter: Payer: Self-pay | Admitting: Internal Medicine

## 2016-09-07 VITALS — BP 130/70 | HR 80 | Resp 16 | Wt 170.0 lb

## 2016-09-07 DIAGNOSIS — F419 Anxiety disorder, unspecified: Secondary | ICD-10-CM

## 2016-09-07 DIAGNOSIS — E782 Mixed hyperlipidemia: Secondary | ICD-10-CM

## 2016-09-07 DIAGNOSIS — R69 Illness, unspecified: Secondary | ICD-10-CM | POA: Diagnosis not present

## 2016-09-07 DIAGNOSIS — Z794 Long term (current) use of insulin: Secondary | ICD-10-CM | POA: Diagnosis not present

## 2016-09-07 DIAGNOSIS — E119 Type 2 diabetes mellitus without complications: Secondary | ICD-10-CM

## 2016-09-07 DIAGNOSIS — F5105 Insomnia due to other mental disorder: Secondary | ICD-10-CM | POA: Diagnosis not present

## 2016-09-07 DIAGNOSIS — Z79899 Other long term (current) drug therapy: Secondary | ICD-10-CM | POA: Diagnosis not present

## 2016-09-07 DIAGNOSIS — D492 Neoplasm of unspecified behavior of bone, soft tissue, and skin: Secondary | ICD-10-CM | POA: Diagnosis not present

## 2016-09-07 LAB — COMPREHENSIVE METABOLIC PANEL
ALT: 17 U/L (ref 0–35)
AST: 15 U/L (ref 0–37)
Albumin: 4.2 g/dL (ref 3.5–5.2)
Alkaline Phosphatase: 109 U/L (ref 39–117)
BUN: 15 mg/dL (ref 6–23)
CO2: 29 mEq/L (ref 19–32)
Calcium: 9.3 mg/dL (ref 8.4–10.5)
Chloride: 102 mEq/L (ref 96–112)
Creatinine, Ser: 0.9 mg/dL (ref 0.40–1.20)
GFR: 66.07 mL/min (ref >60.00)
Glucose, Bld: 169 mg/dL — ABNORMAL HIGH (ref 70–99)
Potassium: 4.2 mEq/L (ref 3.5–5.1)
Sodium: 140 mEq/L (ref 135–145)
Total Bilirubin: 0.5 mg/dL (ref 0.2–1.2)
Total Protein: 6.9 g/dL (ref 6.0–8.3)

## 2016-09-07 LAB — LIPID PANEL
Cholesterol: 202 mg/dL — ABNORMAL HIGH (ref 0–200)
HDL: 49.5 mg/dL (ref >39.00)
NonHDL: 152.83
Total CHOL/HDL Ratio: 4
Triglycerides: 251 mg/dL — ABNORMAL HIGH (ref 0.0–149.0)
VLDL: 50.2 mg/dL — ABNORMAL HIGH (ref 0.0–40.0)

## 2016-09-07 LAB — HEMOGLOBIN A1C: Hgb A1c MFr Bld: 9.1 % — ABNORMAL HIGH (ref 4.6–6.5)

## 2016-09-07 LAB — LDL CHOLESTEROL, DIRECT: Direct LDL: 105 mg/dL

## 2016-09-07 MED ORDER — LISINOPRIL 10 MG PO TABS: ORAL_TABLET | ORAL | 1 refills | Status: DC

## 2016-09-07 NOTE — Patient Instructions (Addendum)
Increase your levemir dose to 20 units  DAILY   Stop eating ice cream! Unless it's  [employer] carb smart    Try the dannon Lt n Fit Greek yogurt  ESPECIALLY  [place] flavor With whipped cream!!  Only  8 carbs   Avoid breaded fish and breaded chicken/meat  TOO MANY CARBS  SEND ME READINGS IN 2 WEEKS OF BLOOD SUGARS

## 2016-09-07 NOTE — Progress Notes (Signed)
Pre visit review using our clinic review tool, if applicable. No additional management support is needed unless otherwise documented below in the visit note. 

## 2016-09-07 NOTE — Progress Notes (Signed)
Subjective:  Patient ID: Maria Hinton, female    DOB: 04/20/1946  Age: 71 y.o. MRN: [id number]  CC: The primary encounter diagnosis was Type 2 diabetes mellitus without complication, with long-term current use of insulin ([place]). Diagnoses of Mixed hyperlipidemia, Insomnia secondary to anxiety, and Skin neoplasm were also pertinent to this visit.  HPI Maria Hinton presents for follow up on uncontrolled type 2 dm complicated by obesity and COPD   Her bs have been persistently elevated and fastings have been ranging fro 150  Up to  270 .  She adits to dietary non adherence and is eating ice cream every night.  Dinner is often inclusive of  Fish sticks and chicken tenders   Using 15 units of Levemir; the cost is $60 for > 3 months supply  Treated in late January for COPD exacerbation and influenza with tamiflu, prednisone taper,  Azithromycin,  Symptoms were slow to resolve and lasted 2 weeks.   Last eye exam jan 2018,  Has bilateral cataracts   left > right   using xanax at night only 0.25 mg for insomnia   ACTICIN KERATOSES BURNED OFF BOTH FOREARMS     Lab Results  Component Value Date   HGBA1C 9.1 (H) 09/07/2016     Outpatient Medications Prior to Visit  Medication Sig Dispense Refill  . albuterol (PROVENTIL HFA;VENTOLIN HFA) 108 (90 BASE) MCG/ACT inhaler Inhale 2 puffs into the lungs every 6 (six) hours as needed for wheezing. 6.7 g 11  . ALPRAZolam (XANAX) 0.25 MG tablet TAKE 1 TABLET BY MOUTH TWICE A DAY AS NEEDED 45 tablet 5  . aspirin EC 81 MG tablet Take 81 mg by mouth daily.    Marland Kitchen atenolol (TENORMIN) 50 MG tablet TAKE 1 TABLET (50 MG TOTAL) BY MOUTH 2 (TWO) TIMES DAILY. 180 tablet 3  . atorvastatin (LIPITOR) 40 MG tablet TAKE 1 TABLET BY MOUTH DAILY AT 6 PM. 90 tablet 3  . Blood Glucose Monitoring Suppl (ONE TOUCH ULTRA SYSTEM KIT) W/DEVICE KIT Pt needs OneTouch Ultra Blue glucometer dx 250.02 1 each 0  . diazepam (VALIUM) 10 MG tablet Take 1 tablet (10 mg total) by  mouth every 12 (twelve) hours as needed (muscle spasm). 30 tablet 1  . Fluticasone-Salmeterol (ADVAIR DISKUS) 500-50 MCG/DOSE AEPB Inhale 1 puff into the lungs 2 (two) times daily. 60 each 0  . glipiZIDE (GLUCOTROL) 10 MG tablet TAKE 1 TABLET BY MOUTH 2 TIMES DAILY BEFORE A MEAL. 180 tablet 3  . glucose blood (ONE TOUCH ULTRA TEST) test strip Use as instructed bid, has OneTouch Ultra Blue  dx: 250.02 100 each 3  . hydrochlorothiazide (HYDRODIURIL) 12.5 MG tablet Take 1 tablet (12.5 mg total) by mouth daily. 90 tablet 3  . Insulin Detemir (LEVEMIR FLEXPEN) 100 UNIT/ML Pen Inject 15 Units into the skin daily at 10 pm. 15 mL 11  . Insulin Pen Needle (EXEL COMFORT POINT PEN NEEDLE) 31G X 4 MM MISC 1 application by Does not apply route daily. 100 each 0  . Lancets (ONETOUCH ULTRASOFT) lancets Use as instructed bid dx 250.02 100 each 3  . meloxicam (MOBIC) 7.5 MG tablet Take 1 tablet (7.5 mg total) by mouth daily. 90 tablet 3  . metFORMIN (GLUCOPHAGE) 1000 MG tablet TAKE 1 TABLET BY MOUTH 2 TIMES DAILY WITH A MEAL. 180 tablet 3  . omeprazole (PRILOSEC) 20 MG capsule TAKE ONE CAPSULE BY MOUTH EVERY DAY 90 capsule 1  . tiotropium (SPIRIVA) 18 MCG inhalation capsule Place 1  capsule (18 mcg total) into inhaler and inhale daily. 30 capsule 12  . azithromycin (ZITHROMAX) 500 MG tablet Take 1 tablet (500 mg total) by mouth daily. 7 tablet 0  . lisinopril (PRINIVIL,ZESTRIL) 5 MG tablet TAKE 1 TABLET (5 MG TOTAL) BY MOUTH DAILY. 90 tablet 3  . oseltamivir (TAMIFLU) 75 MG capsule Take 1 capsule (75 mg total) by mouth 2 (two) times daily. 10 capsule 0  . predniSONE (DELTASONE) 10 MG tablet 6 tablets on Day 1 , then reduce by 1 tablet daily until gone 21 tablet 0  . promethazine-dextromethorphan (PROMETHAZINE-DM) 6.25-15 MG/5ML syrup Take 5 mLs by mouth 4 (four) times daily as needed for cough. 180 mL 0   No facility-administered medications prior to visit.     Review of Systems;  Patient denies headache,  fevers, malaise, unintentional weight loss, skin rash, eye pain, sinus congestion and sinus pain, sore throat, dysphagia,  hemoptysis , cough, dyspnea, wheezing, chest pain, palpitations, orthopnea, edema, abdominal pain, nausea, melena, diarrhea, constipation, flank pain, dysuria, hematuria, urinary  Frequency, nocturia, numbness, tingling, seizures,  Focal weakness, Loss of consciousness,  Tremor, insomnia, depression, anxiety, and suicidal ideation.      Objective:  BP 130/70   Pulse 80   Resp 16   Wt 170 lb (77.1 kg)   SpO2 98%   BMI 29.18 kg/m   BP Readings from Last 3 Encounters:  09/07/16 130/70  08/03/16 130/84  06/03/16 120/74    Wt Readings from Last 3 Encounters:  09/07/16 170 lb (77.1 kg)  08/03/16 167 lb 9.6 oz (76 kg)  06/03/16 170 lb (77.1 kg)    General appearance: alert, cooperative and appears stated age Ears: normal TM's and external ear canals both ears Throat: lips, mucosa, and tongue normal; teeth and gums normal Neck: no adenopathy, no carotid bruit, supple, symmetrical, trachea midline and thyroid not enlarged, symmetric, no tenderness/mass/nodules Back: symmetric, no curvature. ROM normal. No CVA tenderness. Lungs: clear to auscultation bilaterally Heart: regular rate and rhythm, S1, S2 normal, no murmur, click, rub or gallop Abdomen: soft, non-tender; bowel sounds normal; no masses,  no organomegaly Pulses: 2+ and symmetric Skin: Skin color, texture, turgor normal. No rashes or lesions Lymph nodes: Cervical, supraclavicular, and axillary nodes normal.  Lab Results  Component Value Date   HGBA1C 9.1 (H) 09/07/2016   HGBA1C 9.4 (H) 06/01/2016   HGBA1C 7.1 (H) 01/30/2016    Lab Results  Component Value Date   CREATININE 0.90 09/07/2016   CREATININE 1.03 06/01/2016   CREATININE 1.00 01/30/2016    Lab Results  Component Value Date   WBC 8.1 06/01/2016   HGB 13.8 06/01/2016   HCT 41.3 06/01/2016   PLT 280.0 06/01/2016   GLUCOSE 169 (H)  09/07/2016   CHOL 202 (H) 09/07/2016   TRIG 251.0 (H) 09/07/2016   HDL 49.50 09/07/2016   LDLDIRECT 105.0 09/07/2016   LDLCALC 70 01/16/2015   ALT 17 09/07/2016   AST 15 09/07/2016   NA 140 09/07/2016   K 4.2 09/07/2016   CL 102 09/07/2016   CREATININE 0.90 09/07/2016   BUN 15 09/07/2016   CO2 29 09/07/2016   TSH 1.31 11/28/2013   INR 0.9 11/21/2012   HGBA1C 9.1 (H) 09/07/2016   MICROALBUR 2.6 (H) 06/01/2016    Dg Chest 2 View  Result Date: 08/05/2015 CLINICAL DATA:  Cough and shortness of breath for the past 2 weeks, history of [place] OPD, former smoker. EXAM: CHEST  2 VIEW COMPARISON:  PA and lateral chest  x-ray of Nov 21, 2012 FINDINGS: The lungs are mildly hyperinflated with hemidiaphragm flattening. There is no focal infiltrate. There is no pleural effusion. The heart and pulmonary vascularity are normal. The mediastinum is normal in width. There is surgical absence of the posterior aspects of the left eighth and ninth ribs. There is wire suture present over the left lower lateral hemi thorax. There is multilevel degenerative disc disease of the thoracic spine. IMPRESSION: COPD. There is no pneumonia, CHF, nor other acute cardiopulmonary disease. Electronically Signed   By: David  Martinique M.D.   On: 08/05/2015 16:34    Assessment & Plan:   Problem List Items Addressed This Visit    DM type 2 (diabetes mellitus, type 2) ([place]) - Primary    Uncontrolled due to dietary non adherence to low I diet. counselling given. Increase levemir to 20 units daily ,  Discussed adding jardiance if no improvement.  Asked to subit sugars in 2 weeks bvia mychart      Relevant Medications   lisinopril (PRINIVIL,ZESTRIL) 10 MG tablet   Other Relevant Orders   Comprehensive metabolic panel (Completed)   Hemoglobin A1c (Completed)   HLD (hyperlipidemia)    LDL Is  controlled on current statin therapy,  for triglcyeries should improve with better  Control of diabetes, diet and exercise. .   Liver  enzymes are normal , no changes today.  Lab Results  Component Value Date   CHOL 202 (H) 09/07/2016   HDL 49.50 09/07/2016   LDLCALC 70 01/16/2015   LDLDIRECT 105.0 09/07/2016   TRIG 251.0 (H) 09/07/2016   CHOLHDL 4 09/07/2016    Lab Results  Component Value Date   ALT 17 09/07/2016   AST 15 09/07/2016   ALKPHOS 109 09/07/2016   BILITOT 0.5 09/07/2016             Relevant Medications   lisinopril (PRINIVIL,ZESTRIL) 10 MG tablet   Other Relevant Orders   LDL cholesterol, direct (Completed)   Lipid panel (Completed)   Insomnia secondary to anxiety    Managed with alprazolam.  She has occasional trouble managing anxeity during the day since living alone and has requested additional tablets for prn use The risks and benefits of benzodiazepine use were reviewed with patient today including excessive sedation leading to respiratory depression,  impaired thinking/driving, and addiction.  Patient was advised to avoid concurrent use with alcohol, to use medication only as needed and not to share with others  .       Skin neoplasm    She has had multiple actinic keratoses burned off of forearms by dermatology recently.           I have discontinued Ms. Shimon's promethazine-dextromethorphan, oseltamivir, predniSONE, and azithromycin. I have also changed her lisinopril. Additionally, I am having her maintain her glucose blood, ONE TOUCH ULTRA SYSTEM KIT, onetouch ultrasoft, albuterol, diazepam, aspirin EC, omeprazole, Fluticasone-Salmeterol, tiotropium, Insulin Detemir, Insulin Pen Needle, atenolol, atorvastatin, glipiZIDE, hydrochlorothiazide, meloxicam, metFORMIN, and ALPRAZolam.  Meds ordered this encounter  Medications  . lisinopril (PRINIVIL,ZESTRIL) 10 MG tablet    Sig: TAKE 1 TABLET (5 MG TOTAL) BY MOUTH DAILY.    Dispense:  90 tablet    Refill:  1    NOTE DOSE CHANGE KEEP ON FILE FOR FUTURE REFILLS    Medications Discontinued During This Encounter  Medication  Reason  . azithromycin (ZITHROMAX) 500 MG tablet Completed Course  . oseltamivir (TAMIFLU) 75 MG capsule Completed Course  . predniSONE (DELTASONE) 10 MG tablet Completed Course  .  promethazine-dextromethorphan (PROMETHAZINE-DM) 6.25-15 MG/5ML syrup Patient has not taken in last 30 days  . lisinopril (PRINIVIL,ZESTRIL) 5 MG tablet Reorder    Follow-up: No Follow-up on file.   Crecencio Mc, MD

## 2016-09-08 ENCOUNTER — Encounter: Payer: Self-pay | Admitting: Internal Medicine

## 2016-09-08 NOTE — Assessment & Plan Note (Signed)
LDL Is  controlled on current statin therapy,  for triglcyeries should improve with better  Control of diabetes, diet and exercise. .   Liver enzymes are normal , no changes today.  Lab Results  Component Value Date   CHOL 202 (H) 09/07/2016   HDL 49.50 09/07/2016   LDLCALC 70 01/16/2015   LDLDIRECT 105.0 09/07/2016   TRIG 251.0 (H) 09/07/2016   CHOLHDL 4 09/07/2016    Lab Results  Component Value Date   ALT 17 09/07/2016   AST 15 09/07/2016   ALKPHOS 109 09/07/2016   BILITOT 0.5 09/07/2016

## 2016-09-08 NOTE — Assessment & Plan Note (Signed)
Managed with alprazolam.  She has occasional trouble managing anxeity during the day since living alone and has requested additional tablets for prn use The risks and benefits of benzodiazepine use were reviewed with patient today including excessive sedation leading to respiratory depression,  impaired thinking/driving, and addiction.  Patient was advised to avoid concurrent use with alcohol, to use medication only as needed and not to share with others  .

## 2016-09-08 NOTE — Assessment & Plan Note (Signed)
She has had multiple actinic keratoses burned off of forearms by dermatology recently.

## 2016-09-08 NOTE — Assessment & Plan Note (Addendum)
Uncontrolled due to dietary non adherence to low I diet. counselling given. Increase levemir to 20 units daily ,  Discussed adding jardiance if no improvement.  Asked to subit sugars in 2 weeks bvia mychart

## 2016-09-24 ENCOUNTER — Encounter: Payer: Self-pay | Admitting: Internal Medicine

## 2016-09-27 ENCOUNTER — Other Ambulatory Visit: Payer: Self-pay | Admitting: Internal Medicine

## 2016-09-27 NOTE — Progress Notes (Signed)
[facility] Consultation      MRN# [id number] Maria Hinton [birth year]   Assessment and Plan 71 yo F with COPD Stage B, seen for follow up visit, for COPD  --COPD.  -Doing well, continue spiriva. Doing better now that she stopped smoking, we will trial her off advair, if breathing is worse, then take it once per day. -Abs eosinophil count 06/01/16; 600.   Brief History: 71 yo female with COPD Stage B on Advair and spiriva. Quit smoking 07/2015.   Events since last clinic visit: She feels that breathing is doing well.  Patient is currently on Advair and Spiriva, as prescribed. She feels that they are helping. She has not had to use her rescue inhaler since she was sick. She notes that she is fairly active, she walks her dog twice per day. She does not feel that she is limited by her breathing. Dog sleeps in her bed.  Her last cig was about a year ago. Her breathing is much better since she stopped smoking.     Medication:    Current Outpatient Prescriptions:  .  albuterol (PROVENTIL HFA;VENTOLIN HFA) 108 (90 BASE) MCG/ACT inhaler, Inhale 2 puffs into the lungs every 6 (six) hours as needed for wheezing., Disp: 6.7 g, Rfl: 11 .  ALPRAZolam (XANAX) 0.25 MG tablet, TAKE 1 TABLET BY MOUTH TWICE A DAY AS NEEDED, Disp: 45 tablet, Rfl: 5 .  aspirin EC 81 MG tablet, Take 81 mg by mouth daily., Disp: , Rfl:  .  atenolol (TENORMIN) 50 MG tablet, TAKE 1 TABLET (50 MG TOTAL) BY MOUTH 2 (TWO) TIMES DAILY., Disp: 180 tablet, Rfl: 3 .  atorvastatin (LIPITOR) 40 MG tablet, TAKE 1 TABLET BY MOUTH DAILY AT 6 PM., Disp: 90 tablet, Rfl: 3 .  Blood Glucose Monitoring Suppl (ONE TOUCH ULTRA SYSTEM KIT) W/DEVICE KIT, Pt needs OneTouch Ultra Blue glucometer dx 250.02, Disp: 1 each, Rfl: 0 .  diazepam (VALIUM) 10 MG tablet, Take 1 tablet (10 mg total) by mouth every 12 (twelve) hours as needed (muscle spasm)., Disp: 30 tablet, Rfl: 1 .  Fluticasone-Salmeterol (ADVAIR DISKUS) 500-50  MCG/DOSE AEPB, Inhale 1 puff into the lungs 2 (two) times daily., Disp: 60 each, Rfl: 0 .  glipiZIDE (GLUCOTROL) 10 MG tablet, TAKE 1 TABLET BY MOUTH 2 TIMES DAILY BEFORE A MEAL., Disp: 180 tablet, Rfl: 3 .  glucose blood (ONE TOUCH ULTRA TEST) test strip, Use as instructed bid, has OneTouch Ultra Blue  dx: 250.02, Disp: 100 each, Rfl: 3 .  hydrochlorothiazide (HYDRODIURIL) 12.5 MG tablet, Take 1 tablet (12.5 mg total) by mouth daily., Disp: 90 tablet, Rfl: 3 .  Insulin Detemir (LEVEMIR FLEXPEN) 100 UNIT/ML Pen, Inject 15 Units into the skin daily at 10 pm., Disp: 15 mL, Rfl: 11 .  Insulin Pen Needle (EXEL COMFORT POINT PEN NEEDLE) 31G X 4 MM MISC, 1 application by Does not apply route daily., Disp: 100 each, Rfl: 0 .  Lancets (ONETOUCH ULTRASOFT) lancets, Use as instructed bid dx 250.02, Disp: 100 each, Rfl: 3 .  lisinopril (PRINIVIL,ZESTRIL) 10 MG tablet, TAKE 1 TABLET (5 MG TOTAL) BY MOUTH DAILY., Disp: 90 tablet, Rfl: 1 .  meloxicam (MOBIC) 7.5 MG tablet, Take 1 tablet (7.5 mg total) by mouth daily., Disp: 90 tablet, Rfl: 3 .  metFORMIN (GLUCOPHAGE) 1000 MG tablet, TAKE 1 TABLET BY MOUTH 2 TIMES DAILY WITH A MEAL., Disp: 180 tablet, Rfl: 3 .  omeprazole (PRILOSEC) 20 MG capsule, TAKE ONE CAPSULE BY MOUTH EVERY  DAY, Disp: 90 capsule, Rfl: 1 .  tiotropium (SPIRIVA) 18 MCG inhalation capsule, Place 1 capsule (18 mcg total) into inhaler and inhale daily., Disp: 30 capsule, Rfl: 12    Review of Systems  Constitutional: Negative for chills and fever.  HENT: Negative for hearing loss.   Eyes: Negative.  Negative for pain.  Respiratory: Negative for cough, sputum production, shortness of breath and wheezing.   Cardiovascular: Negative for chest pain and leg swelling.  Gastrointestinal: Negative for abdominal pain, heartburn and vomiting.  Genitourinary: Negative.   Musculoskeletal: Negative for myalgias and neck pain.  Skin: Negative for rash.  Neurological: Negative.  Negative for headaches.    Endo/Heme/Allergies: Negative.   Psychiatric/Behavioral: Negative.       Allergies:  Vicodin [hydrocodone-acetaminophen]  Physical Examination:  VS: BP 116/64 (BP Location: Left Arm, Cuff Size: Normal)   Pulse 83   Wt 171 lb (77.6 kg)   SpO2 95%   BMI 29.35 kg/m   General Appearance: No distress  HEENT: PERRLA, no ptosis, no other lesions noticed Pulmonary:good airway entry, no wheezes Cardiovascular:  Normal S1,S2.  No m/r/g.     Abdomen:Exam: Benign, Soft, non-tender, No masses  Skin:   warm, no rashes, no ecchymosis  Extremities: normal, no cyanosis, clubbing, warm with normal capillary refill.        Updated Medication List Outpatient Encounter Prescriptions as of 09/28/2016  Medication Sig  . albuterol (PROVENTIL HFA;VENTOLIN HFA) 108 (90 BASE) MCG/ACT inhaler Inhale 2 puffs into the lungs every 6 (six) hours as needed for wheezing.  Marland Kitchen ALPRAZolam (XANAX) 0.25 MG tablet TAKE 1 TABLET BY MOUTH TWICE A DAY AS NEEDED  . aspirin EC 81 MG tablet Take 81 mg by mouth daily.  Marland Kitchen atenolol (TENORMIN) 50 MG tablet TAKE 1 TABLET (50 MG TOTAL) BY MOUTH 2 (TWO) TIMES DAILY.  Marland Kitchen atorvastatin (LIPITOR) 40 MG tablet TAKE 1 TABLET BY MOUTH DAILY AT 6 PM.  . Blood Glucose Monitoring Suppl (ONE TOUCH ULTRA SYSTEM KIT) W/DEVICE KIT Pt needs OneTouch Ultra Blue glucometer dx 250.02  . diazepam (VALIUM) 10 MG tablet Take 1 tablet (10 mg total) by mouth every 12 (twelve) hours as needed (muscle spasm).  . Fluticasone-Salmeterol (ADVAIR DISKUS) 500-50 MCG/DOSE AEPB Inhale 1 puff into the lungs 2 (two) times daily.  Marland Kitchen glipiZIDE (GLUCOTROL) 10 MG tablet TAKE 1 TABLET BY MOUTH 2 TIMES DAILY BEFORE A MEAL.  Marland Kitchen glucose blood (ONE TOUCH ULTRA TEST) test strip Use as instructed bid, has OneTouch Ultra Blue  dx: 250.02  . hydrochlorothiazide (HYDRODIURIL) 12.5 MG tablet Take 1 tablet (12.5 mg total) by mouth daily.  . Insulin Detemir (LEVEMIR FLEXPEN) 100 UNIT/ML Pen Inject 15 Units into the skin daily at  10 pm.  . Insulin Pen Needle (EXEL COMFORT POINT PEN NEEDLE) 31G X 4 MM MISC 1 application by Does not apply route daily.  . Lancets (ONETOUCH ULTRASOFT) lancets Use as instructed bid dx 250.02  . lisinopril (PRINIVIL,ZESTRIL) 10 MG tablet TAKE 1 TABLET (5 MG TOTAL) BY MOUTH DAILY.  . meloxicam (MOBIC) 7.5 MG tablet Take 1 tablet (7.5 mg total) by mouth daily.  . metFORMIN (GLUCOPHAGE) 1000 MG tablet TAKE 1 TABLET BY MOUTH 2 TIMES DAILY WITH A MEAL.  Marland Kitchen omeprazole (PRILOSEC) 20 MG capsule TAKE ONE CAPSULE BY MOUTH EVERY DAY  . tiotropium (SPIRIVA) 18 MCG inhalation capsule Place 1 capsule (18 mcg total) into inhaler and inhale daily.   No facility-administered encounter medications on file as of 09/28/2016.  Orders for this visit: No orders of the defined types were placed in this encounter.   Thank  you for the visitation and for allowing  [facility] to assist in the care of your patient. Our recommendations are noted above.  Please contact us if we can be of further service.  Marda Stalker, MD [facility] Office Number: [phone]

## 2016-09-28 ENCOUNTER — Ambulatory Visit (INDEPENDENT_AMBULATORY_CARE_PROVIDER_SITE_OTHER): Payer: Medicare HMO | Admitting: Internal Medicine

## 2016-09-28 ENCOUNTER — Encounter: Payer: Self-pay | Admitting: Internal Medicine

## 2016-09-28 VITALS — BP 116/64 | HR 83 | Wt 171.0 lb

## 2016-09-28 DIAGNOSIS — J438 Other emphysema: Secondary | ICD-10-CM | POA: Diagnosis not present

## 2016-09-28 NOTE — Patient Instructions (Addendum)
--  recommend removing pet from bedroom.  --can trial off of advair, if notice that breathing is worse, then try it just once per day.

## 2016-09-28 NOTE — Addendum Note (Signed)
Addended by: Laverle Hobby on: 09/28/2016 01:55 PM   Modules accepted: Level of Service

## 2016-11-09 ENCOUNTER — Telehealth: Payer: Self-pay | Admitting: Internal Medicine

## 2016-11-09 DIAGNOSIS — E119 Type 2 diabetes mellitus without complications: Secondary | ICD-10-CM

## 2016-11-09 DIAGNOSIS — E782 Mixed hyperlipidemia: Secondary | ICD-10-CM

## 2016-11-09 DIAGNOSIS — Z794 Long term (current) use of insulin: Secondary | ICD-10-CM

## 2016-11-09 DIAGNOSIS — I10 Essential (primary) hypertension: Secondary | ICD-10-CM

## 2016-11-09 NOTE — Telephone Encounter (Signed)
Pt would like to get fasting labs done before her appt. Need order please and thank you!  Call pt @ [phone].

## 2016-11-10 NOTE — Telephone Encounter (Signed)
Labs have been ordered and called pt to schedule a lab appt. Pt is aware of appt date and time.

## 2016-12-04 LAB — HM DIABETES EYE EXAM

## 2016-12-16 DIAGNOSIS — M5416 Radiculopathy, lumbar region: Secondary | ICD-10-CM | POA: Diagnosis not present

## 2016-12-18 ENCOUNTER — Other Ambulatory Visit: Payer: Self-pay | Admitting: Internal Medicine

## 2016-12-18 NOTE — Telephone Encounter (Signed)
Xanax 0.25 Last OV 09/07/2016 Next OV 12/28/2016 Last refilled 06/19/2016

## 2016-12-22 NOTE — Telephone Encounter (Signed)
Please phone in a 30 day supply

## 2016-12-22 NOTE — Telephone Encounter (Signed)
Phoned in Rx per provider request, thanks

## 2016-12-23 ENCOUNTER — Other Ambulatory Visit (INDEPENDENT_AMBULATORY_CARE_PROVIDER_SITE_OTHER): Payer: Medicare HMO

## 2016-12-23 DIAGNOSIS — I10 Essential (primary) hypertension: Secondary | ICD-10-CM

## 2016-12-23 DIAGNOSIS — E119 Type 2 diabetes mellitus without complications: Secondary | ICD-10-CM | POA: Diagnosis not present

## 2016-12-23 DIAGNOSIS — R05 Cough: Secondary | ICD-10-CM

## 2016-12-23 DIAGNOSIS — R058 Other specified cough: Secondary | ICD-10-CM

## 2016-12-23 DIAGNOSIS — E782 Mixed hyperlipidemia: Secondary | ICD-10-CM | POA: Diagnosis not present

## 2016-12-23 DIAGNOSIS — Z794 Long term (current) use of insulin: Secondary | ICD-10-CM | POA: Diagnosis not present

## 2016-12-23 LAB — COMPREHENSIVE METABOLIC PANEL
ALT: 18 U/L (ref 0–35)
AST: 13 U/L (ref 0–37)
Albumin: 4.1 g/dL (ref 3.5–5.2)
Alkaline Phosphatase: 90 U/L (ref 39–117)
BUN: 18 mg/dL (ref 6–23)
CO2: 31 mEq/L (ref 19–32)
Calcium: 9.5 mg/dL (ref 8.4–10.5)
Chloride: 99 mEq/L (ref 96–112)
Creatinine, Ser: 0.99 mg/dL (ref 0.40–1.20)
GFR: 59.13 mL/min — ABNORMAL LOW (ref >60.00)
Glucose, Bld: 86 mg/dL (ref 70–99)
Potassium: 4.1 mEq/L (ref 3.5–5.1)
Sodium: 138 mEq/L (ref 135–145)
Total Bilirubin: 0.4 mg/dL (ref 0.2–1.2)
Total Protein: 6.6 g/dL (ref 6.0–8.3)

## 2016-12-23 LAB — LDL CHOLESTEROL, DIRECT: Direct LDL: 60 mg/dL

## 2016-12-23 LAB — CBC WITH DIFFERENTIAL/PLATELET
Basophils Absolute: 0.1 10*3/uL (ref 0.0–0.1)
Basophils Relative: 0.4 % (ref 0.0–3.0)
Eosinophils Absolute: 0.4 10*3/uL (ref 0.0–0.7)
Eosinophils Relative: 3 % (ref 0.0–5.0)
HCT: 39.5 % (ref 36.0–46.0)
Hemoglobin: 13.4 g/dL (ref 12.0–15.0)
Lymphocytes Relative: 40.9 % (ref 12.0–46.0)
Lymphs Abs: 5.6 10*3/uL — ABNORMAL HIGH (ref 0.7–4.0)
MCHC: 33.9 g/dL (ref 30.0–36.0)
MCV: 92.4 fl (ref 78.0–100.0)
Monocytes Absolute: 0.9 10*3/uL (ref 0.1–1.0)
Monocytes Relative: 6.6 % (ref 3.0–12.0)
Neutro Abs: 6.7 10*3/uL (ref 1.4–7.7)
Neutrophils Relative %: 49.1 % (ref 43.0–77.0)
Platelets: 320 10*3/uL (ref 150.0–400.0)
RBC: 4.27 Mil/uL (ref 3.87–5.11)
RDW: 13.2 % (ref 11.5–15.5)
WBC: 13.8 10*3/uL — ABNORMAL HIGH (ref 4.0–10.5)

## 2016-12-23 LAB — LIPID PANEL
Cholesterol: 138 mg/dL (ref 0–200)
HDL: 56.5 mg/dL (ref >39.00)
LDL Cholesterol: 59 mg/dL (ref 0–99)
NonHDL: 81.3
Total CHOL/HDL Ratio: 2
Triglycerides: 113 mg/dL (ref 0.0–149.0)
VLDL: 22.6 mg/dL (ref 0.0–40.0)

## 2016-12-23 LAB — HEMOGLOBIN A1C: Hgb A1c MFr Bld: 7.9 % — ABNORMAL HIGH (ref 4.6–6.5)

## 2016-12-24 ENCOUNTER — Encounter: Payer: Self-pay | Admitting: Internal Medicine

## 2016-12-28 ENCOUNTER — Encounter: Payer: Self-pay | Admitting: Internal Medicine

## 2016-12-28 ENCOUNTER — Ambulatory Visit (INDEPENDENT_AMBULATORY_CARE_PROVIDER_SITE_OTHER): Payer: Medicare HMO | Admitting: Internal Medicine

## 2016-12-28 ENCOUNTER — Ambulatory Visit: Payer: Medicare HMO | Admitting: Internal Medicine

## 2016-12-28 ENCOUNTER — Telehealth: Payer: Self-pay | Admitting: Internal Medicine

## 2016-12-28 VITALS — BP 112/68 | HR 65 | Temp 98.2°F | Resp 14 | Ht 64.0 in | Wt 171.6 lb

## 2016-12-28 DIAGNOSIS — E119 Type 2 diabetes mellitus without complications: Secondary | ICD-10-CM | POA: Diagnosis not present

## 2016-12-28 DIAGNOSIS — F419 Anxiety disorder, unspecified: Secondary | ICD-10-CM

## 2016-12-28 DIAGNOSIS — M5416 Radiculopathy, lumbar region: Secondary | ICD-10-CM

## 2016-12-28 DIAGNOSIS — R69 Illness, unspecified: Secondary | ICD-10-CM | POA: Diagnosis not present

## 2016-12-28 DIAGNOSIS — Z794 Long term (current) use of insulin: Secondary | ICD-10-CM

## 2016-12-28 DIAGNOSIS — F5105 Insomnia due to other mental disorder: Secondary | ICD-10-CM

## 2016-12-28 MED ORDER — DICLOFENAC SODIUM 75 MG PO TBEC: 75.0000 mg | DELAYED_RELEASE_TABLET | Freq: Two times a day (BID) | ORAL | 0 refills | Status: DC

## 2016-12-28 MED ORDER — CYCLOBENZAPRINE HCL 5 MG PO TABS: 5.0000 mg | ORAL_TABLET | Freq: Three times a day (TID) | ORAL | 0 refills | Status: DC | PRN

## 2016-12-28 MED ORDER — INSULIN DETEMIR 100 UNIT/ML FLEXPEN: 20.0000 [IU] | PEN_INJECTOR | Freq: Every day | SUBCUTANEOUS | 11 refills | Status: DC

## 2016-12-28 NOTE — Patient Instructions (Addendum)
For your back  We discussed trying an alternative NSAID  (diclofenac twice daily,  or aleve twice daily  )  PLUS   TYLENOL 500 MG 4 TIMES DAILY OR 1000 MG TWICE DAILY (THE MAX DOSE IS 2000 MG DAILY)  Plus:  FLEXERIL 5 MG MUSCLE RELAXER EVERY 8 HOURS IF NEEDED   For your diabetes:  Continue your current diabetes regimen and start checking yoru sugars once daily.  Let me know if your fasting sugars are > 130  Or your post prandials are > 180

## 2016-12-28 NOTE — Progress Notes (Signed)
Subjective:  Patient ID: Maria Hinton, female    DOB: 07/30/1945  Age: 71 y.o. MRN: [id number]  CC: The primary encounter diagnosis was Type 2 diabetes mellitus without complication, with long-term current use of insulin ([place]). Diagnoses of Left lumbar radiculopathy and Insomnia secondary to anxiety were also pertinent to this visit.  HPI Maria Hinton presents for 3 month follow up on diabetes.  Not doing well.   Saw KK at [facility] for sciatica  With left sided thigh pain relieved by bending ove. r pain resolved transiently while on a prednisone taper for the first two days  Then returned..  . Can't afford PT.  Taking meloxicam and wearing a back brace.    Still In  pain.  Stopped walking her dog due to pain .  History of 3 prior back surgeries,  1st one remotely by Maria Hinton in  2013 and 2nd one in 2016    Not checking sugars,  Family member threw it in the garbage   Lab Results  Component Value Date   HGBA1C 7.9 (H) 12/23/2016     Outpatient Medications Prior to Visit  Medication Sig Dispense Refill  . albuterol (PROVENTIL HFA;VENTOLIN HFA) 108 (90 BASE) MCG/ACT inhaler Inhale 2 puffs into the lungs every 6 (six) hours as needed for wheezing. 6.7 g 11  . ALPRAZolam (XANAX) 0.25 MG tablet TAKE 1 TABLET BY MOUTH TWICE A DAY AS NEEDED 45 tablet 0  . aspirin EC 81 MG tablet Take 81 mg by mouth daily.    Marland Kitchen atenolol (TENORMIN) 50 MG tablet TAKE 1 TABLET (50 MG TOTAL) BY MOUTH 2 (TWO) TIMES DAILY. 180 tablet 3  . atorvastatin (LIPITOR) 40 MG tablet TAKE 1 TABLET BY MOUTH DAILY AT 6 PM. 90 tablet 3  . BD PEN NEEDLE NANO U/F 32G X 4 MM MISC USE DAILY 100 each 1  . Blood Glucose Monitoring Suppl (ONE TOUCH ULTRA SYSTEM KIT) W/DEVICE KIT Pt needs OneTouch Ultra Blue glucometer dx 250.02 1 each 0  . diazepam (VALIUM) 10 MG tablet Take 1 tablet (10 mg total) by mouth every 12 (twelve) hours as needed (muscle spasm). 30 tablet 1  . Fluticasone-Salmeterol (ADVAIR DISKUS) 500-50 MCG/DOSE AEPB  Inhale 1 puff into the lungs 2 (two) times daily. 60 each 0  . glipiZIDE (GLUCOTROL) 10 MG tablet TAKE 1 TABLET BY MOUTH 2 TIMES DAILY BEFORE A MEAL. 180 tablet 3  . glucose blood (ONE TOUCH ULTRA TEST) test strip Use as instructed bid, has OneTouch Ultra Blue  dx: 250.02 100 each 3  . hydrochlorothiazide (HYDRODIURIL) 12.5 MG tablet Take 1 tablet (12.5 mg total) by mouth daily. 90 tablet 3  . Lancets (ONETOUCH ULTRASOFT) lancets Use as instructed bid dx 250.02 100 each 3  . lisinopril (PRINIVIL,ZESTRIL) 10 MG tablet TAKE 1 TABLET (5 MG TOTAL) BY MOUTH DAILY. 90 tablet 1  . metFORMIN (GLUCOPHAGE) 1000 MG tablet TAKE 1 TABLET BY MOUTH 2 TIMES DAILY WITH A MEAL. 180 tablet 3  . omeprazole (PRILOSEC) 20 MG capsule TAKE ONE CAPSULE BY MOUTH EVERY DAY 90 capsule 1  . tiotropium (SPIRIVA) 18 MCG inhalation capsule Place 1 capsule (18 mcg total) into inhaler and inhale daily. 30 capsule 12  . Insulin Detemir (LEVEMIR FLEXPEN) 100 UNIT/ML Pen Inject 15 Units into the skin daily at 10 pm. 15 mL 11  . meloxicam (MOBIC) 7.5 MG tablet Take 1 tablet (7.5 mg total) by mouth daily. 90 tablet 3   No facility-administered medications prior to  visit.     Review of Systems;  Patient denies headache, fevers, malaise, unintentional weight loss, skin rash, eye pain, sinus congestion and sinus pain, sore throat, dysphagia,  hemoptysis , cough, dyspnea, wheezing, chest pain, palpitations, orthopnea, edema, abdominal pain, nausea, melena, diarrhea, constipation, flank pain, dysuria, hematuria, urinary  Frequency, nocturia, numbness, tingling, seizures,  Focal weakness, Loss of consciousness,  Tremor, insomnia, depression, anxiety, and suicidal ideation.      Objective:  BP 112/68 (BP Location: Left Arm, Patient Position: Sitting, Cuff Size: Normal)   Pulse 65   Temp 98.2 F (36.8 C) (Oral)   Resp 14   Ht 5' 4"  (1.626 m)   Wt 171 lb 9.6 oz (77.8 kg)   SpO2 97%   BMI 29.46 kg/m   BP Readings from Last 3  Encounters:  12/28/16 112/68  09/28/16 116/64  09/07/16 130/70    Wt Readings from Last 3 Encounters:  12/28/16 171 lb 9.6 oz (77.8 kg)  09/28/16 171 lb (77.6 kg)  09/07/16 170 lb (77.1 kg)    General appearance: alert, cooperative and appears stated age Ears: normal TM's and external ear canals both ears Throat: lips, mucosa, and tongue normal; teeth and gums normal Neck: no adenopathy, no carotid bruit, supple, symmetrical, trachea midline and thyroid not enlarged, symmetric, no tenderness/mass/nodules Back: symmetric, no curvature. ROM normal. No CVA tenderness. Lungs: clear to auscultation bilaterally Heart: regular rate and rhythm, S1, S2 normal, no murmur, click, rub or gallop Abdomen: soft, non-tender; bowel sounds normal; no masses,  no organomegaly Pulses: 2+ and symmetric Skin: Skin color, texture, turgor normal. No rashes or lesions Lymph nodes: Cervical, supraclavicular, and axillary nodes normal.  Lab Results  Component Value Date   HGBA1C 7.9 (H) 12/23/2016   HGBA1C 9.1 (H) 09/07/2016   HGBA1C 9.4 (H) 06/01/2016    Lab Results  Component Value Date   CREATININE 0.99 12/23/2016   CREATININE 0.90 09/07/2016   CREATININE 1.03 06/01/2016    Lab Results  Component Value Date   WBC 13.8 (H) 12/23/2016   HGB 13.4 12/23/2016   HCT 39.5 12/23/2016   PLT 320.0 12/23/2016   GLUCOSE 86 12/23/2016   CHOL 138 12/23/2016   TRIG 113.0 12/23/2016   HDL 56.50 12/23/2016   LDLDIRECT 60.0 12/23/2016   LDLCALC 59 12/23/2016   ALT 18 12/23/2016   AST 13 12/23/2016   NA 138 12/23/2016   K 4.1 12/23/2016   CL 99 12/23/2016   CREATININE 0.99 12/23/2016   BUN 18 12/23/2016   CO2 31 12/23/2016   TSH 1.31 11/28/2013   INR 0.9 11/21/2012   HGBA1C 7.9 (H) 12/23/2016   MICROALBUR 2.6 (H) 06/01/2016     Assessment & Plan:   Problem List Items Addressed This Visit    Left lumbar radiculopathy    Secondary to multilevel disk disease with foraminal and spinal stenosis  despite prior laminectomy in 2016.  Trial of diclofenac , tylenol and flexeril.       Relevant Medications   cyclobenzaprine (FLEXERIL) 5 MG tablet   Insomnia secondary to anxiety    Managed with alprazolam.   The risks and benefits of benzodiazepine use were reviewed with patient today including excessive sedation leading to respiratory depression,  impaired thinking/driving, and addiction.  Patient was advised to avoid concurrent use with alcohol, to use medication only as needed and not to share with others  .       DM type 2 (diabetes mellitus, type 2) ([place]) - Primary  Improved but not at goal.   Patient advised to submit  A log of BS in 2-3 weeks for medication adjustment   Lab Results  Component Value Date   HGBA1C 7.9 (H) 12/23/2016   Lab Results  Component Value Date   MICROALBUR 2.6 (H) 06/01/2016         Relevant Medications   Insulin Detemir (LEVEMIR FLEXPEN) 100 UNIT/ML Pen   Other Relevant Orders   Comprehensive metabolic panel   Hemoglobin A1c   Lipid panel      I have discontinued Ms. Cartlidge's meloxicam. I have also changed her Insulin Detemir. Additionally, I am having her start on diclofenac and cyclobenzaprine. Lastly, I am having her maintain her glucose blood, ONE TOUCH ULTRA SYSTEM KIT, onetouch ultrasoft, albuterol, diazepam, aspirin EC, omeprazole, Fluticasone-Salmeterol, tiotropium, atenolol, atorvastatin, glipiZIDE, hydrochlorothiazide, metFORMIN, lisinopril, BD PEN NEEDLE NANO U/F, and ALPRAZolam.  Meds ordered this encounter  Medications  . diclofenac (VOLTAREN) 75 MG EC tablet    Sig: Take 1 tablet (75 mg total) by mouth 2 (two) times daily.    Dispense:  60 tablet    Refill:  0  . cyclobenzaprine (FLEXERIL) 5 MG tablet    Sig: Take 1 tablet (5 mg total) by mouth 3 (three) times daily as needed for muscle spasms.    Dispense:  90 tablet    Refill:  0  . Insulin Detemir (LEVEMIR FLEXPEN) 100 UNIT/ML Pen    Sig: Inject 20 Units into the skin  daily at 10 pm.    Dispense:  15 mL    Refill:  11    Medications Discontinued During This Encounter  Medication Reason  . meloxicam (MOBIC) 7.5 MG tablet   . Insulin Detemir (LEVEMIR FLEXPEN) 100 UNIT/ML Pen Reorder    Follow-up: Return in about 3 months (around 03/30/2017) for follow up diabetes.   Crecencio Mc, MD

## 2016-12-28 NOTE — Telephone Encounter (Signed)
Pt called about her medication of cyclobenzaprine (FLEXERIL) 5 MG tablet was called to the incorrect pharmacy. Cancel the one in [place]. Please  Pharmacy is [id number] - [place], [place] - [address]  Call pt @ [phone]. Thank you!

## 2016-12-28 NOTE — Telephone Encounter (Signed)
Refill has been completed already.

## 2016-12-30 DIAGNOSIS — M5416 Radiculopathy, lumbar region: Secondary | ICD-10-CM | POA: Insufficient documentation

## 2016-12-30 NOTE — Assessment & Plan Note (Addendum)
Secondary to multilevel disk disease with foraminal and spinal stenosis despite prior laminectomy in 2016.  Trial of diclofenac , tylenol and flexeril.

## 2016-12-30 NOTE — Assessment & Plan Note (Addendum)
Improved but not at goal.   Patient advised to submit  A log of BS in 2-3 weeks for medication adjustment   Lab Results  Component Value Date   HGBA1C 7.9 (H) 12/23/2016   Lab Results  Component Value Date   MICROALBUR 2.6 (H) 06/01/2016

## 2016-12-30 NOTE — Assessment & Plan Note (Signed)
Managed with alprazolam.   The risks and benefits of benzodiazepine use were reviewed with patient today including excessive sedation leading to respiratory depression,  impaired thinking/driving, and addiction.  Patient was advised to avoid concurrent use with alcohol, to use medication only as needed and not to share with others  .

## 2016-12-31 ENCOUNTER — Telehealth: Payer: Self-pay

## 2016-12-31 NOTE — Telephone Encounter (Signed)
Submitted PA for cyclobenzaprine on covermymeds.

## 2017-01-05 NOTE — Telephone Encounter (Signed)
Medication has been approved. Pharmacy has been notified.   Effective through 07/05/2017

## 2017-01-23 ENCOUNTER — Other Ambulatory Visit: Payer: Self-pay | Admitting: Internal Medicine

## 2017-01-29 ENCOUNTER — Ambulatory Visit: Payer: Medicare HMO

## 2017-02-28 ENCOUNTER — Other Ambulatory Visit: Payer: Self-pay | Admitting: Internal Medicine

## 2017-03-10 ENCOUNTER — Other Ambulatory Visit: Payer: Self-pay | Admitting: Internal Medicine

## 2017-03-10 DIAGNOSIS — Z1231 Encounter for screening mammogram for malignant neoplasm of breast: Secondary | ICD-10-CM

## 2017-03-14 ENCOUNTER — Other Ambulatory Visit: Payer: Self-pay | Admitting: Internal Medicine

## 2017-03-24 ENCOUNTER — Encounter: Payer: Self-pay | Admitting: Internal Medicine

## 2017-03-24 MED ORDER — OMEPRAZOLE 20 MG PO CPDR: 20.0000 mg | DELAYED_RELEASE_CAPSULE | Freq: Every day | ORAL | 0 refills | Status: DC

## 2017-03-25 ENCOUNTER — Ambulatory Visit
Admission: RE | Admit: 2017-03-25 | Discharge: 2017-03-25 | Disposition: A | Discharge status: Home / Self Care | Payer: Medicare HMO | Source: Ambulatory Visit | Attending: Internal Medicine | Admitting: Internal Medicine

## 2017-03-25 DIAGNOSIS — Z1231 Encounter for screening mammogram for malignant neoplasm of breast: Secondary | ICD-10-CM | POA: Diagnosis not present

## 2017-03-31 NOTE — Telephone Encounter (Signed)
Error

## 2017-04-01 ENCOUNTER — Other Ambulatory Visit (INDEPENDENT_AMBULATORY_CARE_PROVIDER_SITE_OTHER): Payer: Medicare HMO

## 2017-04-01 DIAGNOSIS — E119 Type 2 diabetes mellitus without complications: Secondary | ICD-10-CM | POA: Diagnosis not present

## 2017-04-01 DIAGNOSIS — Z794 Long term (current) use of insulin: Secondary | ICD-10-CM | POA: Diagnosis not present

## 2017-04-01 LAB — COMPREHENSIVE METABOLIC PANEL
ALT: 18 U/L (ref 0–35)
AST: 16 U/L (ref 0–37)
Albumin: 3.7 g/dL (ref 3.5–5.2)
Alkaline Phosphatase: 86 U/L (ref 39–117)
BUN: 11 mg/dL (ref 6–23)
CO2: 30 mEq/L (ref 19–32)
Calcium: 8.6 mg/dL (ref 8.4–10.5)
Chloride: 102 mEq/L (ref 96–112)
Creatinine, Ser: 0.81 mg/dL (ref 0.40–1.20)
GFR: 74.48 mL/min (ref >60.00)
Glucose, Bld: 96 mg/dL (ref 70–99)
Potassium: 3.9 mEq/L (ref 3.5–5.1)
Sodium: 139 mEq/L (ref 135–145)
Total Bilirubin: 0.7 mg/dL (ref 0.2–1.2)
Total Protein: 6.4 g/dL (ref 6.0–8.3)

## 2017-04-01 LAB — LIPID PANEL
Cholesterol: 117 mg/dL (ref 0–200)
HDL: 39.8 mg/dL (ref >39.00)
NonHDL: 77.54
Total CHOL/HDL Ratio: 3
Triglycerides: 209 mg/dL — ABNORMAL HIGH (ref 0.0–149.0)
VLDL: 41.8 mg/dL — ABNORMAL HIGH (ref 0.0–40.0)

## 2017-04-01 LAB — LDL CHOLESTEROL, DIRECT: Direct LDL: 55 mg/dL

## 2017-04-01 LAB — HEMOGLOBIN A1C: Hgb A1c MFr Bld: 6.8 % — ABNORMAL HIGH (ref 4.6–6.5)

## 2017-04-01 NOTE — Telephone Encounter (Signed)
Error

## 2017-04-04 ENCOUNTER — Encounter: Payer: Self-pay | Admitting: Internal Medicine

## 2017-04-05 ENCOUNTER — Ambulatory Visit (INDEPENDENT_AMBULATORY_CARE_PROVIDER_SITE_OTHER): Payer: Medicare HMO | Admitting: Internal Medicine

## 2017-04-05 ENCOUNTER — Encounter: Payer: Self-pay | Admitting: Internal Medicine

## 2017-04-05 ENCOUNTER — Ambulatory Visit: Payer: Medicare HMO | Admitting: Internal Medicine

## 2017-04-05 VITALS — BP 128/72 | HR 76 | Temp 98.5°F | Resp 15 | Ht 64.0 in | Wt 165.2 lb

## 2017-04-05 DIAGNOSIS — M5416 Radiculopathy, lumbar region: Secondary | ICD-10-CM

## 2017-04-05 DIAGNOSIS — Z794 Long term (current) use of insulin: Secondary | ICD-10-CM | POA: Diagnosis not present

## 2017-04-05 DIAGNOSIS — I10 Essential (primary) hypertension: Secondary | ICD-10-CM

## 2017-04-05 DIAGNOSIS — E782 Mixed hyperlipidemia: Secondary | ICD-10-CM | POA: Diagnosis not present

## 2017-04-05 DIAGNOSIS — I872 Venous insufficiency (chronic) (peripheral): Secondary | ICD-10-CM

## 2017-04-05 DIAGNOSIS — Z23 Encounter for immunization: Secondary | ICD-10-CM

## 2017-04-05 DIAGNOSIS — D692 Other nonthrombocytopenic purpura: Secondary | ICD-10-CM | POA: Diagnosis not present

## 2017-04-05 DIAGNOSIS — E119 Type 2 diabetes mellitus without complications: Secondary | ICD-10-CM

## 2017-04-05 LAB — CBC WITH DIFFERENTIAL/PLATELET
Basophils Absolute: 0.1 10*3/uL (ref 0.0–0.1)
Basophils Relative: 0.6 % (ref 0.0–3.0)
Eosinophils Absolute: 0.9 10*3/uL — ABNORMAL HIGH (ref 0.0–0.7)
Eosinophils Relative: 9.1 % — ABNORMAL HIGH (ref 0.0–5.0)
HCT: 38 % (ref 36.0–46.0)
Hemoglobin: 12.8 g/dL (ref 12.0–15.0)
Lymphocytes Relative: 24.1 % (ref 12.0–46.0)
Lymphs Abs: 2.3 10*3/uL (ref 0.7–4.0)
MCHC: 33.6 g/dL (ref 30.0–36.0)
MCV: 90.7 fl (ref 78.0–100.0)
Monocytes Absolute: 0.8 10*3/uL (ref 0.1–1.0)
Monocytes Relative: 7.9 % (ref 3.0–12.0)
Neutro Abs: 5.6 10*3/uL (ref 1.4–7.7)
Neutrophils Relative %: 58.3 % (ref 43.0–77.0)
Platelets: 281 10*3/uL (ref 150.0–400.0)
RBC: 4.19 Mil/uL (ref 3.87–5.11)
RDW: 12.8 % (ref 11.5–15.5)
WBC: 9.7 10*3/uL (ref 4.0–10.5)

## 2017-04-05 LAB — SEDIMENTATION RATE: Sed Rate: 5 mm/hr (ref 0–30)

## 2017-04-05 MED ORDER — TRAMADOL HCL 50 MG PO TABS: 50.0000 mg | ORAL_TABLET | Freq: Three times a day (TID) | ORAL | 0 refills | Status: DC | PRN

## 2017-04-05 NOTE — Assessment & Plan Note (Signed)
No current radiation except to groin.

## 2017-04-05 NOTE — Patient Instructions (Addendum)
You can add 2000 mg tylenol daily to your NSAIDs (either meloxicam,  or aleve or motrin )   Try [place] patches with 4% lidocaine    The swelling may be due to meloxicam .  Stop it for a few weeks and take the tramadol instead.  DO NOT add aleve or motrin . Marland Kitchen Ok to add tylenol   Chronic Venous Insufficiency Chronic venous insufficiency, also called venous stasis, is a condition that prevents blood from being pumped effectively through the veins in your legs. Blood may no longer be pumped effectively from the legs back to the heart. This condition can range from mild to severe. With proper treatment, you should be able to continue with an active life. What are the causes? Chronic venous insufficiency occurs when the vein walls become stretched, weakened, or damaged, or when valves within the vein are damaged. Some common causes of this include:  High blood pressure inside the veins (venous hypertension).  Increased blood pressure in the leg veins from long periods of sitting or standing.  A blood clot that blocks blood flow in a vein (deep vein thrombosis, DVT).  Inflammation of a vein (phlebitis) that causes a blood clot to form.  Tumors in the pelvis that cause blood to back up.  What increases the risk? The following factors may make you more likely to develop this condition:  Having a family history of this condition.  Obesity.  Pregnancy.  Living without enough physical activity or exercise (sedentary lifestyle).  Smoking.  Having a job that requires long periods of standing or sitting in one place.  Being a certain age. Women in their 33s and 39s and men in their 33s are more likely to develop this condition.  What are the signs or symptoms? Symptoms of this condition include:  Veins that are enlarged, bulging, or twisted (varicose veins).  Skin breakdown or ulcers.  Reddened or discolored skin on the front of the leg.  Brown, smooth, tight, and painful skin  just above the ankle, usually on the inside of the leg (lipodermatosclerosis).  Swelling.  How is this diagnosed? This condition may be diagnosed based on:  Your medical history.  A physical exam.  Tests, such as: ? A procedure that creates an image of a blood vessel and nearby organs and provides information about blood flow through the blood vessel (duplex ultrasound). ? A procedure that tests blood flow (plethysmography). ? A procedure to look at the veins using X-ray and dye (venogram).  How is this treated? The goals of treatment are to help you return to an active life and to minimize pain or disability. Treatment depends on the severity of your condition, and it may include:  Wearing compression stockings. These can help relieve symptoms and help prevent your condition from getting worse. However, they do not cure the condition.  Sclerotherapy. This is a procedure involving an injection of a material that "dissolves" damaged veins.  Surgery. This may involve: ? Removing a diseased vein (vein stripping). ? Cutting off blood flow through the vein (laser ablation surgery). ? Repairing a valve.  Follow these instructions at home:  Wear compression stockings as told by your health care provider. These stockings help to prevent blood clots and reduce swelling in your legs.  Take over-the-counter and prescription medicines only as told by your health care provider.  Stay active by exercising, walking, or doing different activities. Ask your health care provider what activities are safe for you and how  much exercise you need.  Drink enough fluid to keep your urine clear or pale yellow.  Do not use any products that contain nicotine or tobacco, such as cigarettes and e-cigarettes. If you need help quitting, ask your health care provider.  Keep all follow-up visits as told by your health care provider. This is important. Contact a health care provider if:  You have redness,  swelling, or more pain in the affected area.  You see a red streak or line that extends up or down from the affected area.  You have skin breakdown or a loss of skin in the affected area, even if the breakdown is small.  You get an injury in the affected area. Get help right away if:  You get an injury and an open wound in the affected area.  You have severe pain that does not get better with medicine.  You have sudden numbness or weakness in the foot or ankle below the affected area, or you have trouble moving your foot or ankle.  You have a fever and you have worse or persistent symptoms.  You have chest pain.  You have shortness of breath. Summary  Chronic venous insufficiency, also called venous stasis, is a condition that prevents blood from being pumped effectively through the veins in your legs.  Chronic venous insufficiency occurs when the vein walls become stretched, weakened, or damaged, or when valves within the vein are damaged.  Treatment for this condition depends on how severe your condition is, and it may involve wearing compression stockings or having a procedure.  Make sure you stay active by exercising, walking, or doing different activities. Ask your health care provider what activities are safe for you and how much exercise you need. This information is not intended to replace advice given to you by your health care provider. Make sure you discuss any questions you have with your health care provider. Document Released: 10/26/2006 Document Revised: 05/11/2016 Document Reviewed: 05/11/2016 Elsevier Interactive Patient Education  2017 [employer].

## 2017-04-05 NOTE — Progress Notes (Signed)
Subjective:  Patient ID: Maria Hinton, female    DOB: [birth year]  Age: 71 y.o. MRN: [id number]  CC: The encounter diagnosis was Encounter for immunization.  HPI Maria Hinton presents for 3 month follow up on diabetes.and other issues.  Patient is following a low glycemic index diet and taking all prescribed medications regularly without side effects.  Fasting sugars have been under less than 140 most of the time and post prandials have been under 160 except on rare occasions. Patient is exercising about 3 times per week and intentionally trying to lose weight .  Patient has had an eye exam in the last 12 months and checks feet regularly for signs of infection.  Patient does not walk barefoot outside,  And denies ayn numbness tingling or burning in feet. Patient is up to date on all recommended vaccinations  Lab Results  Component Value Date   HGBA1C 6.8 (H) 04/01/2017     Cc:  6 weeks ago developed a painful papular rash described as  panif  ul "knots:" under tehs skin .  All occurred simultaneously on right thigh and right popliteal fossa .  Developed into bruises in a matter of days.   Now left with hyperpigmented macules. Denies itching,  Spread,  Fever,  Recent new medications, or insect bites.  No recent travel.   Used neosporin type ointment with steroid cream   Has chronic groin pain referred from her lower back . Only new medication was  flexeril 3 months ago ,  Was not taking them at the time because the flexeril did not help her back pain     Back pain no longer allowing her to walk her dog as far or as long as previously..  Pain radiates to groin only,  Not to thigh or  lower leg. Pain is localized to the paraspinous muscles of the  lower thoracic spine  on the right.  Seeing [place].   No improvement with flexeril.[place] due to history of  Prior back surgeries (3) on L3, 4 and 5.  Did not hear back from him.  [facility] offered PT  Which patient deferred due to out  of pocket cost,  So MRI not ordered because per [facility] it would not covered by insurance   Taking meloxicam for years, has not tried adding  tylenol.  New onset swelling at night  Involving the ankle of the tight leg .   Outpatient Medications Prior to Visit  Medication Sig Dispense Refill  . albuterol (PROVENTIL HFA;VENTOLIN HFA) 108 (90 BASE) MCG/ACT inhaler Inhale 2 puffs into the lungs every 6 (six) hours as needed for wheezing. 6.7 g 11  . ALPRAZolam (XANAX) 0.25 MG tablet TAKE 1 TABLET BY MOUTH TWICE A DAY AS NEEDED 45 tablet 1  . aspirin EC 81 MG tablet Take 81 mg by mouth daily.    Marland Kitchen atenolol (TENORMIN) 50 MG tablet TAKE 1 TABLET (50 MG TOTAL) BY MOUTH 2 (TWO) TIMES DAILY. 180 tablet 3  . atorvastatin (LIPITOR) 40 MG tablet TAKE 1 TABLET BY MOUTH DAILY AT 6 PM. 90 tablet 3  . BD PEN NEEDLE NANO U/F 32G X 4 MM MISC USE DAILY AS DIRECTED 100 each 1  . Blood Glucose Monitoring Suppl (ONE TOUCH ULTRA SYSTEM KIT) W/DEVICE KIT Pt needs OneTouch Ultra Blue glucometer dx 250.02 1 each 0  . Fluticasone-Salmeterol (ADVAIR DISKUS) 500-50 MCG/DOSE AEPB Inhale 1 puff into the lungs 2 (two) times daily. 60 each 0  .  glipiZIDE (GLUCOTROL) 10 MG tablet TAKE 1 TABLET BY MOUTH 2 TIMES DAILY BEFORE A MEAL. 180 tablet 3  . glucose blood (ONE TOUCH ULTRA TEST) test strip Use as instructed bid, has OneTouch Ultra Blue  dx: 250.02 100 each 3  . hydrochlorothiazide (HYDRODIURIL) 12.5 MG tablet Take 1 tablet (12.5 mg total) by mouth daily. 90 tablet 3  . Insulin Detemir (LEVEMIR FLEXPEN) 100 UNIT/ML Pen Inject 20 Units into the skin daily at 10 pm. 15 mL 11  . Lancets (ONETOUCH ULTRASOFT) lancets Use as instructed bid dx 250.02 100 each 3  . lisinopril (PRINIVIL,ZESTRIL) 10 MG tablet TAKE 1 TABLET BY MOUTH DAILY 90 tablet 1  . metFORMIN (GLUCOPHAGE) 1000 MG tablet TAKE 1 TABLET BY MOUTH 2 TIMES DAILY WITH A MEAL. 180 tablet 3  . omeprazole (PRILOSEC) 20 MG capsule Take 1 capsule (20 mg total) by mouth daily. 90  capsule 0  . cyclobenzaprine (FLEXERIL) 5 MG tablet Take 1 tablet (5 mg total) by mouth 3 (three) times daily as needed for muscle spasms. (Patient not taking: Reported on 04/05/2017) 90 tablet 0  . diazepam (VALIUM) 10 MG tablet Take 1 tablet (10 mg total) by mouth every 12 (twelve) hours as needed (muscle spasm). (Patient not taking: Reported on 04/05/2017) 30 tablet 1  . diclofenac (VOLTAREN) 75 MG EC tablet Take 1 tablet (75 mg total) by mouth 2 (two) times daily. (Patient not taking: Reported on 04/05/2017) 60 tablet 0  . tiotropium (SPIRIVA) 18 MCG inhalation capsule Place 1 capsule (18 mcg total) into inhaler and inhale daily. (Patient not taking: Reported on 04/05/2017) 30 capsule 12   No facility-administered medications prior to visit.     Review of Systems;  Patient denies headache, fevers, malaise, unintentional weight loss, skin rash, eye pain, sinus congestion and sinus pain, sore throat, dysphagia,  hemoptysis , cough, dyspnea, wheezing, chest pain, palpitations, orthopnea, edema, abdominal pain, nausea, melena, diarrhea, constipation, flank pain, dysuria, hematuria, urinary  Frequency, nocturia, numbness, tingling, seizures,  Focal weakness, Loss of consciousness,  Tremor, insomnia, depression, anxiety, and suicidal ideation.      Objective:  BP 128/72 (BP Location: Left Arm, Patient Position: Sitting, Cuff Size: Normal)   Pulse 76   Temp 98.5 F (36.9 C) (Oral)   Resp 15   Ht 5' 4"  (1.626 m)   Wt 165 lb 3.2 oz (74.9 kg)   SpO2 97%   BMI 28.36 kg/m   BP Readings from Last 3 Encounters:  04/05/17 128/72  12/28/16 112/68  09/28/16 116/64    Wt Readings from Last 3 Encounters:  04/05/17 165 lb 3.2 oz (74.9 kg)  12/28/16 171 lb 9.6 oz (77.8 kg)  09/28/16 171 lb (77.6 kg)    General appearance: alert, cooperative and appears stated age Ears: normal TM's and external ear canals both ears Throat: lips, mucosa, and tongue normal; teeth and gums normal Neck: no  adenopathy, no carotid bruit, supple, symmetrical, trachea midline and thyroid not enlarged, symmetric, no tenderness/mass/nodules Back: symmetric, no curvature. ROM normal. No CVA tenderness. Lungs: clear to auscultation bilaterally Heart: regular rate and rhythm, S1, S2 normal, no murmur, click, rub or gallop Abdomen: soft, non-tender; bowel sounds normal; no masses,  no organomegaly Pulses: 2+ and symmetric Skin: Skin color, texture, turgor normal. No rashes or lesions Lymph nodes: Cervical, supraclavicular, and axillary nodes normal.  Lab Results  Component Value Date   HGBA1C 6.8 (H) 04/01/2017   HGBA1C 7.9 (H) 12/23/2016   HGBA1C 9.1 (H) 09/07/2016  Lab Results  Component Value Date   CREATININE 0.81 04/01/2017   CREATININE 0.99 12/23/2016   CREATININE 0.90 09/07/2016    Lab Results  Component Value Date   WBC 13.8 (H) 12/23/2016   HGB 13.4 12/23/2016   HCT 39.5 12/23/2016   PLT 320.0 12/23/2016   GLUCOSE 96 04/01/2017   CHOL 117 04/01/2017   TRIG 209.0 (H) 04/01/2017   HDL 39.80 04/01/2017   LDLDIRECT 55.0 04/01/2017   LDLCALC 59 12/23/2016   ALT 18 04/01/2017   AST 16 04/01/2017   NA 139 04/01/2017   K 3.9 04/01/2017   CL 102 04/01/2017   CREATININE 0.81 04/01/2017   BUN 11 04/01/2017   CO2 30 04/01/2017   TSH 1.31 11/28/2013   INR 0.9 11/21/2012   HGBA1C 6.8 (H) 04/01/2017   MICROALBUR 2.6 (H) 06/01/2016    Mm Screening Breast Tomo Bilateral  Result Date: 03/25/2017 CLINICAL DATA:  Screening. EXAM: 2D DIGITAL SCREENING BILATERAL MAMMOGRAM WITH CAD AND ADJUNCT TOMO COMPARISON:  Previous exam(s). ACR Breast Density Category b: There are scattered areas of fibroglandular density. FINDINGS: There are no findings suspicious for malignancy. Images were processed with CAD. IMPRESSION: No mammographic evidence of malignancy. A result letter of this screening mammogram will be mailed directly to the patient. RECOMMENDATION: Screening mammogram in one year.  (Code:SM-B-01Y) BI-RADS CATEGORY  1: Negative. Electronically Signed   By: Pamelia Hoit M.D.   On: 03/25/2017 14:19    Assessment & Plan:   Problem List Items Addressed This Visit    None    Visit Diagnoses    Encounter for immunization       Relevant Orders   Flu vaccine HIGH DOSE PF (Completed)      I have discontinued Ms. Line's diazepam, tiotropium, diclofenac, and cyclobenzaprine. I am also having her maintain her glucose blood, ONE TOUCH ULTRA SYSTEM KIT, onetouch ultrasoft, albuterol, aspirin EC, Fluticasone-Salmeterol, atenolol, atorvastatin, glipiZIDE, hydrochlorothiazide, metFORMIN, Insulin Detemir, ALPRAZolam, lisinopril, BD PEN NEEDLE NANO U/F, omeprazole, and meloxicam.  Meds ordered this encounter  Medications  . meloxicam (MOBIC) 7.5 MG tablet    Sig: TAKE 1 TABLET (7.5 MG TOTAL) BY MOUTH DAILY.    Refill:  3    Medications Discontinued During This Encounter  Medication Reason  . cyclobenzaprine (FLEXERIL) 5 MG tablet Patient has not taken in last 30 days  . diazepam (VALIUM) 10 MG tablet Patient has not taken in last 30 days  . diclofenac (VOLTAREN) 75 MG EC tablet Patient has not taken in last 30 days  . tiotropium (SPIRIVA) 18 MCG inhalation capsule Patient has not taken in last 30 days    Follow-up: No Follow-up on file.   Crecencio Mc, MD

## 2017-04-05 NOTE — Assessment & Plan Note (Signed)
Well controlled on current regimen. Renal function stable, no changes today.  Lab Results  Component Value Date   CREATININE 0.81 04/01/2017   Lab Results  Component Value Date   NA 139 04/01/2017   K 3.9 04/01/2017   CL 102 04/01/2017   CO2 30 04/01/2017

## 2017-04-05 NOTE — Assessment & Plan Note (Signed)
LDL Is  controlled on current statin therapy,  for triglcyeries should improve with better  Control of diabetes, diet and exercise. .   Liver enzymes are normal , no changes today.  Lab Results  Component Value Date   CHOL 117 04/01/2017   HDL 39.80 04/01/2017   LDLCALC 59 12/23/2016   LDLDIRECT 55.0 04/01/2017   TRIG 209.0 (H) 04/01/2017   CHOLHDL 3 04/01/2017    Lab Results  Component Value Date   ALT 18 04/01/2017   AST 16 04/01/2017   ALKPHOS 86 04/01/2017   BILITOT 0.7 04/01/2017

## 2017-04-05 NOTE — Assessment & Plan Note (Signed)
May be aggravated by meloxicam.  Advised to suspend for 2-3 weeks and use tramadol as a trial

## 2017-04-05 NOTE — Assessment & Plan Note (Signed)
Currently well-controlled on current medications .  hemoglobin A1c is at goal of less than 7.0 . Patient is reminded to schedule an annual eye exam and foot exam is normal today. Patient has no microalbuminuria. Patient is tolerating statin therapy for CAD risk reduction and on ACE/ARB for renal protection and hypertension   Lab Results  Component Value Date   HGBA1C 6.8 (H) 04/01/2017   . Lab Results  Component Value Date   MICROALBUR 2.6 (H) 06/01/2016

## 2017-04-06 ENCOUNTER — Encounter: Payer: Self-pay | Admitting: Internal Medicine

## 2017-05-02 ENCOUNTER — Other Ambulatory Visit: Payer: Self-pay | Admitting: Internal Medicine

## 2017-05-24 ENCOUNTER — Ambulatory Visit: Payer: Medicare HMO | Admitting: Internal Medicine

## 2017-05-24 ENCOUNTER — Encounter: Payer: Self-pay | Admitting: Internal Medicine

## 2017-05-24 VITALS — BP 158/90 | HR 110 | Temp 98.3°F | Ht 64.5 in | Wt 156.1 lb

## 2017-05-24 DIAGNOSIS — R531 Weakness: Secondary | ICD-10-CM | POA: Diagnosis not present

## 2017-05-24 DIAGNOSIS — R110 Nausea: Secondary | ICD-10-CM | POA: Diagnosis not present

## 2017-05-24 DIAGNOSIS — R7989 Other specified abnormal findings of blood chemistry: Secondary | ICD-10-CM | POA: Diagnosis not present

## 2017-05-24 DIAGNOSIS — R634 Abnormal weight loss: Secondary | ICD-10-CM | POA: Diagnosis not present

## 2017-05-24 DIAGNOSIS — I10 Essential (primary) hypertension: Secondary | ICD-10-CM | POA: Diagnosis not present

## 2017-05-24 DIAGNOSIS — M545 Low back pain: Secondary | ICD-10-CM

## 2017-05-24 DIAGNOSIS — M791 Myalgia, unspecified site: Secondary | ICD-10-CM

## 2017-05-24 DIAGNOSIS — R5383 Other fatigue: Secondary | ICD-10-CM

## 2017-05-24 DIAGNOSIS — R000 Tachycardia, unspecified: Secondary | ICD-10-CM | POA: Diagnosis not present

## 2017-05-24 DIAGNOSIS — R1310 Dysphagia, unspecified: Secondary | ICD-10-CM | POA: Insufficient documentation

## 2017-05-24 DIAGNOSIS — R42 Dizziness and giddiness: Secondary | ICD-10-CM

## 2017-05-24 DIAGNOSIS — R1319 Other dysphagia: Secondary | ICD-10-CM | POA: Insufficient documentation

## 2017-05-24 DIAGNOSIS — M549 Dorsalgia, unspecified: Secondary | ICD-10-CM | POA: Insufficient documentation

## 2017-05-24 DIAGNOSIS — Z1159 Encounter for screening for other viral diseases: Secondary | ICD-10-CM | POA: Diagnosis not present

## 2017-05-24 LAB — POCT URINALYSIS DIPSTICK
Bilirubin, UA: NEGATIVE
Glucose, UA: 500
Ketones, UA: NEGATIVE
Leukocytes, UA: NEGATIVE
Nitrite, UA: NEGATIVE
Protein, UA: 30
Spec Grav, UA: 1.015 (ref 1.010–1.025)
Urobilinogen, UA: 0.2 E.U./dL
pH, UA: 5.5 (ref 5.0–8.0)

## 2017-05-24 MED ORDER — PROMETHAZINE HCL 12.5 MG PO TABS: 12.5000 mg | ORAL_TABLET | Freq: Three times a day (TID) | ORAL | 0 refills | Status: DC | PRN

## 2017-05-24 NOTE — Patient Instructions (Signed)
1. Please follow up in 1 week  2. We will do labs today and imaging CT chest abdomen and pelvis if negative consider PET scan given prior CT 2013 as reviewed  3. Increase fluids and food intake. Please get up slowly and try not to make sudden movements    Dysphagia Dysphagia is trouble swallowing. This condition occurs when solids and liquids stick in a person's throat on the way down to the stomach, or when food takes longer to get to the stomach. You may have problems swallowing food, liquids, or both. You may also have pain while trying to swallow. It may take you more time and effort to swallow something. What are the causes? This condition is caused by:  Problems with the muscles. They may make it difficult for you to move food and liquids through the tube that connects your mouth to your stomach (esophagus). You may have ulcers, scar tissue, or inflammation that blocks the normal passage of food and liquids. Causes of these problems include: ? Acid reflux from your stomach into your esophagus (gastroesophageal reflux). ? Infections. ? Radiation treatment for cancer. ? Medicines taken without enough fluids to wash them down into your stomach.  Nerve problems. These prevent signals from being sent to the muscles of your esophagus to squeeze (contract) and move what you swallow down to your stomach.  Globus pharyngeus. This is a common problem that involves feeling like something is stuck in the throat or a sense of trouble with swallowing even though nothing is wrong with the swallowing passages.  Stroke. This can affect the nerves and make it difficult to swallow.  Certain conditions, such as cerebral palsy or Parkinson disease.  What are the signs or symptoms? Common symptoms of this condition include:  A feeling that solids or liquids are stuck in your throat on the way down to the stomach.  Food taking too long to get to the stomach.  Other symptoms include:  Food moving back  from your stomach to your mouth (regurgitation).  Noises coming from your throat.  Chest discomfort with swallowing.  A feeling of fullness when swallowing.  Drooling, especially when the throat is blocked.  Pain while swallowing.  Heartburn.  Coughing or gagging while trying to swallow.  How is this diagnosed? This condition is diagnosed by:  Barium X-ray. In this test, you swallow a white substance (contrast medium)that sticks to the inside of your esophagus. X-ray images are then taken.  Endoscopy. In this test, a flexible telescope is inserted down your throat to look at your esophagus and your stomach.  CT scans and MRI.  How is this treated? Treatment for dysphagia depends on the cause of the condition:  If the dysphagia is caused by acid reflux or infection, medicines may be used. They may include antibiotics and heartburn medicines.  If the dysphagia is caused by problems with your muscles, swallowing therapy may be used to help you strengthen your swallowing muscles. You may have to do specific exercises to strengthen the muscles or stretch them.  If the dysphagia is caused by a blockage or mass, procedures to remove the blockage may be done. You may need surgery and a feeding tube.  You may need to make diet changes. Ask your health care provider for specific instructions. Follow these instructions at home: Eating and drinking  Try to eat soft food that is easier to swallow.  Follow any diet changes as told by your health care provider.  Cut your  food into small pieces and eat slowly.  Eat and drink only when you are sitting upright.  Do not drink alcohol or caffeine. If you need help quitting, ask your health care provider. General instructions  Check your weight every day to make sure you are not losing weight.  Take over-the-counter and prescription medicines only as told by your health care provider.  If you were prescribed an antibiotic medicine,  take it as told by your health care provider. Do not stop taking the antibiotic even if you start to feel better.  Do not use any products that contain nicotine or tobacco, such as cigarettes and e-cigarettes. If you need help quitting, ask your health care provider.  Keep all follow-up visits as told by your health care provider. This is important. Contact a health care provider if:  You lose weight because you cannot swallow.  You cough when you drink liquids (aspiration).  You cough up partially digested food. Get help right away if:  You cannot swallow your saliva.  You have shortness of breath or a fever, or both.  You have a hoarse voice and also have trouble swallowing. Summary  Dysphagia is trouble swallowing. This condition occurs when solids and liquids stick in a person's throat on the way down to the stomach, or when food takes longer to get to the stomach.  Dysphagia has many possible causes and symptoms.  Treatment for dysphagia depends on the cause of the condition. This information is not intended to replace advice given to you by your health care provider. Make sure you discuss any questions you have with your health care provider. Document Released: 06/19/2000 Document Revised: 06/11/2016 Document Reviewed: 06/11/2016 Elsevier Interactive Patient Education  2017 [employer]  Nausea, Adult Feeling sick to your stomach (nausea) means that your stomach is upset or you feel like you have to throw up (vomit). Feeling sick to your stomach is usually not serious, but it may be an early sign of a more serious medical problem. As you feel sicker to your stomach, it can lead to throwing up (vomiting). If you throw up, or if you are not able to drink enough fluids, there is a risk of dehydration. Dehydration can make you feel tired and thirsty, have a dry mouth, and pee (urinate) less often. Older adults and people who have other diseases or a weak defense (immune) system  have a higher risk of dehydration. The main goal of treating this condition is to:  Limit how often you feel sick to your stomach.  Prevent throwing up and dehydration.  Follow these instructions at home: Follow instructions from your doctor about how to care for yourself at home. Eating and drinking Follow these recommendations as told by your doctor:  Take an oral rehydration solution (ORS). This is a drink that is sold at pharmacies and stores.  Drink clear fluids in small amounts as you are able, such as: ? Water. ? Ice chips. ? Fruit juice that has water added (diluted fruit juice). ? Low-calorie sports drinks.  Eat bland, easy to digest foods in small amounts as you are able, such as: ? Bananas. ? Applesauce. ? Rice. ? Lean meats. ? Toast. ? Crackers.  Avoid drinking fluids that contain a lot of sugar or caffeine.  Avoid alcohol.  Avoid spicy or fatty foods.  General instructions  Drink enough fluid to keep your pee (urine) clear or pale yellow.  Wash your hands often. If you cannot use soap and water,  use hand sanitizer.  Make sure that all people in your household wash their hands well and often.  Rest at home while you get better.  Take over-the-counter and prescription medicines only as told by your doctor.  Breathe slowly and deeply when you feel sick to your stomach.  Watch your condition for any changes.  Keep all follow-up visits as told by your doctor. This is important. Contact a doctor if:  You have a headache.  You have new symptoms.  You feel sicker to your stomach.  You have a fever.  You feel light-headed or dizzy.  You throw up.  You are not able to keep fluids down. Get help right away if:  You have pain in your chest, neck, arm, or jaw.  You feel very weak or you pass out (faint).  You have throw up that is bright red or looks like coffee grounds.  You have bloody or black poop (stools), or poop that looks like  tar.  You have a very bad headache, a stiff neck, or both.  You have very bad pain, cramping, or bloating in your belly.  You have a rash.  You have trouble breathing or you are breathing very quickly.  Your heart is beating very quickly.  Your skin feels cold and clammy.  You feel confused.  You have pain while peeing.  You have signs of dehydration, such as: ? Dark pee, or very little or no pee. ? Cracked lips. ? Dry mouth. ? Sunken eyes. ? Sleepiness. ? Weakness. These symptoms may be an emergency. Do not wait to see if the symptoms will go away. Get medical help right away. Call your local emergency services (911 in the [place] Do not drive yourself to the hospital. This information is not intended to replace advice given to you by your health care provider. Make sure you discuss any questions you have with your health care provider. Document Released: 06/11/2011 Document Revised: 11/28/2015 Document Reviewed: 02/26/2015 Elsevier Interactive Patient Education  2018 [employer]. Fatigue Fatigue is feeling tired all of the time, a lack of energy, or a lack of motivation. Occasional or mild fatigue is often a normal response to activity or life in general. However, long-lasting (chronic) or extreme fatigue may indicate an underlying medical condition. Follow these instructions at home: Watch your fatigue for any changes. The following actions may help to lessen any discomfort you are feeling:  Talk to your health care provider about how much sleep you need each night. Try to get the required amount every night.  Take medicines only as directed by your health care provider.  Eat a healthy and nutritious diet. Ask your health care provider if you need help changing your diet.  Drink enough fluid to keep your urine clear or pale yellow.  Practice ways of relaxing, such as yoga, meditation, massage therapy, or acupuncture.  Exercise regularly.  Change situations that cause  you stress. Try to keep your work and personal routine reasonable.  Do not abuse illegal drugs.  Limit alcohol intake to no more than 1 drink per day for nonpregnant women and 2 drinks per day for men. One drink equals 12 ounces of beer, 5 ounces of wine, or 1 ounces of hard liquor.  Take a multivitamin, if directed by your health care provider.  Contact a health care provider if:  Your fatigue does not get better.  You have a fever.  You have unintentional weight loss or gain.  You have  headaches.  You have difficulty: ? Falling asleep. ? Sleeping throughout the night.  You feel angry, guilty, anxious, or sad.  You are unable to have a bowel movement (constipation).  You skin is dry.  Your legs or another part of your body is swollen. Get help right away if:  You feel confused.  Your vision is blurry.  You feel faint or pass out.  You have a severe headache.  You have severe abdominal, pelvic, or back pain.  You have chest pain, shortness of breath, or an irregular or fast heartbeat.  You are unable to urinate or you urinate less than normal.  You develop abnormal bleeding, such as bleeding from the rectum, vagina, nose, lungs, or nipples.  You vomit blood.  You have thoughts about harming yourself or committing suicide.  You are worried that you might harm someone else. This information is not intended to replace advice given to you by your health care provider. Make sure you discuss any questions you have with your health care provider. Document Released: 04/19/2007 Document Revised: 11/28/2015 Document Reviewed: 10/24/2013 Elsevier Interactive Patient Education  [employer].

## 2017-05-25 ENCOUNTER — Encounter: Payer: Self-pay | Admitting: Internal Medicine

## 2017-05-25 ENCOUNTER — Other Ambulatory Visit: Payer: Self-pay | Admitting: Radiology

## 2017-05-25 ENCOUNTER — Other Ambulatory Visit: Payer: Medicare HMO

## 2017-05-25 DIAGNOSIS — M545 Low back pain: Secondary | ICD-10-CM | POA: Diagnosis not present

## 2017-05-25 DIAGNOSIS — R634 Abnormal weight loss: Secondary | ICD-10-CM | POA: Diagnosis not present

## 2017-05-25 LAB — CBC WITH DIFFERENTIAL/PLATELET
Basophils Absolute: 0.1 10*3/uL (ref 0.0–0.1)
Basophils Relative: 1.1 % (ref 0.0–3.0)
Eosinophils Absolute: 0.2 10*3/uL (ref 0.0–0.7)
Eosinophils Relative: 2.7 % (ref 0.0–5.0)
HCT: 40.2 % (ref 36.0–46.0)
Hemoglobin: 13.3 g/dL (ref 12.0–15.0)
Lymphocytes Relative: 35.9 % (ref 12.0–46.0)
Lymphs Abs: 2.5 10*3/uL (ref 0.7–4.0)
MCHC: 33.1 g/dL (ref 30.0–36.0)
MCV: 90 fl (ref 78.0–100.0)
Monocytes Absolute: 0.6 10*3/uL (ref 0.1–1.0)
Monocytes Relative: 8 % (ref 3.0–12.0)
Neutro Abs: 3.7 10*3/uL (ref 1.4–7.7)
Neutrophils Relative %: 52.3 % (ref 43.0–77.0)
Platelets: 272 10*3/uL (ref 150.0–400.0)
RBC: 4.47 Mil/uL (ref 3.87–5.11)
RDW: 12.7 % (ref 11.5–15.5)
WBC: 7.1 10*3/uL (ref 4.0–10.5)

## 2017-05-25 LAB — COMPREHENSIVE METABOLIC PANEL
ALT: 62 U/L — ABNORMAL HIGH (ref 0–35)
AST: 65 U/L — ABNORMAL HIGH (ref 0–37)
Albumin: 3.8 g/dL (ref 3.5–5.2)
Alkaline Phosphatase: 98 U/L (ref 39–117)
BUN: 14 mg/dL (ref 6–23)
CO2: 28 mEq/L (ref 19–32)
Calcium: 9.1 mg/dL (ref 8.4–10.5)
Chloride: 96 mEq/L (ref 96–112)
Creatinine, Ser: 0.91 mg/dL (ref 0.40–1.20)
GFR: 65.09 mL/min (ref >60.00)
Glucose, Bld: 214 mg/dL — ABNORMAL HIGH (ref 70–99)
Potassium: 3.5 mEq/L (ref 3.5–5.1)
Sodium: 135 mEq/L (ref 135–145)
Total Bilirubin: 0.5 mg/dL (ref 0.2–1.2)
Total Protein: 6.7 g/dL (ref 6.0–8.3)

## 2017-05-25 LAB — CK: Total CK: 35 U/L (ref 7–177)

## 2017-05-25 LAB — C-REACTIVE PROTEIN: CRP: 0.2 mg/dL — ABNORMAL LOW (ref 0.5–20.0)

## 2017-05-25 LAB — SEDIMENTATION RATE: Sed Rate: 3 mm/hr (ref 0–30)

## 2017-05-25 LAB — TSH: TSH: <0.01 u[IU]/mL — ABNORMAL LOW (ref 0.35–4.50)

## 2017-05-25 LAB — T4, FREE: Free T4: 4.15 ng/dL — ABNORMAL HIGH (ref 0.60–1.60)

## 2017-05-25 LAB — HIV ANTIBODY (ROUTINE TESTING W REFLEX): HIV 1&2 Ab, 4th Generation: NONREACTIVE

## 2017-05-25 NOTE — Addendum Note (Signed)
Addended by: Arby Barrette on: 05/25/2017 11:29 AM   Modules accepted: Orders

## 2017-05-25 NOTE — Progress Notes (Addendum)
Chief Complaint  Patient presents with  . Weight Loss  . Fatigue  . Nausea  . Back Pain   Pt presents for acute visit with multiple complaints.  C/o rapid wt loss since 12/2016 She reports she has lost 17 lbs but records reviewed indicate 14.8 lbs. She reports weakness and all of aching, no appetite with early satiety, nausea with dry heaves w/o vomiting, +dysphagia.  She also c/o hip, leg and back pain, fatigue, and muscle weakness. She likes to walk her dog but is unable.  She also feels like heart is racing, she feels her eatings throbbing 2/2 to this and she feels jittery and dizzy. She has had crying spells due to health decline. Due to feeling poorly she has reduced appetite and therefore reports she has not taken her medication today. She also c/o dysphagia. Denies GI/GU blood.     I reviewed at CT chest ab/pelvis from 2013 + mediastinal lymph notes, nonobstructing ureteral stone cant be excluded, non specific retroperitoneal and para aortic and para illiac lymph nodes noted. Reason for CT was night sweats and malaise and CT rec PET/CT can be performed if sx's persist. Informed patient.     Back Pain  Associated symptoms include weakness and weight loss. Pertinent negatives include no chest pain.   Review of Systems  Constitutional: Positive for malaise/fatigue and weight loss.  Respiratory: Positive for shortness of breath.   Cardiovascular: Positive for palpitations. Negative for chest pain.  Gastrointestinal: Positive for nausea. Negative for blood in stool and vomiting.       +dysphagia   Genitourinary:       Denies vaginal bleeding  Musculoskeletal: Positive for back pain and myalgias.  Neurological: Positive for weakness.  Psychiatric/Behavioral:       +crying spells   Past Medical History:  Diagnosis Date  . Arthritis   . Asthma   . Chronic back pain    DDD,herniated disc/spondylosis/radiculopathy  . COPD (chronic obstructive pulmonary disease) ([place])   . Diabetes  mellitus   . Early cataracts, bilateral    Dr. Ruthine Dose - [facility]  . GERD (gastroesophageal reflux disease)    takes Omeprazole daily  . Hemorrhoids   . History of colonic polyps    colonoscopy 04/2012 - Dr Candace Cruise  . Hyperlipidemia    takes Pravastatin daily  . Hypertension    takes Metoprolol/HCTZ daily  . Insomnia   . Joint pain    fingers  . Joint swelling   . Pneumonia    x 2 ;in Dec 2010/2011  . PONV (postoperative nausea and vomiting)    Past Surgical History:  Procedure Laterality Date  . ABDOMINAL HYSTERECTOMY     at age 79  . BACK SURGERY  20+yrs ago  . COLONOSCOPY    . COLONOSCOPY N/A 05/13/2015   Performed by Hulen Luster, MD at [facility]  . ESOPHAGOGASTRODUODENOSCOPY  2011  . FOOT SURGERY  2011   bunionectomy and knot removed from bottom of both feet  . JOINT REPLACEMENT Left 2014   hip  Kraskinski  . LUMBAR LAMINECTOMY/DECOMPRESSION MICRODISCECTOMY 1 LEVEL N/A 06/26/2015   Performed by Jovita Gamma, MD at [facility] NEURO ORS  . LUMBAR LAMINECTOMY/DECOMPRESSION MICRODISCECTOMY 1 LEVEL Right 09/10/2011   Performed by Hosie Spangle, MD at [facility] NEURO ORS  . MELANOMA EXCISION  2011   on nose/back on neck  . THORACIC DISC SURGERY  at age 59 and 67    mass of veins that had gotten into muscle  and wrapped around rib;3 ribs also removed from left side  . TONSILLECTOMY  as a child   and adenoids   . TOTAL HIP ARTHROPLASTY Left 2014   Dr. Mack Guise  . TUBAL LIGATION    . VAGINA SURGERY     vaginal wall ruptured    Family History  Problem Relation Age of Onset  . Heart disease Brother        valve replacement  . COPD Brother   . COPD Mother        Emphysema  . Diabetes Mother   . Heart disease Father   . Seizures Father   . Cancer Father        Lung cancer with brain metastasis  . Asthma Brother   . COPD Son 29  . Anesthesia problems Neg Hx   . Hypotension Neg Hx   . Malignant hyperthermia Neg Hx   . Pseudochol deficiency Neg Hx    Social  History   Socioeconomic History  . Marital status: Widowed    Spouse name: Not on file  . Number of children: Not on file  . Years of education: Not on file  . Highest education level: Not on file  Social Needs  . Financial resource strain: Not on file  . Food insecurity - worry: Not on file  . Food insecurity - inability: Not on file  . Transportation needs - medical: Not on file  . Transportation needs - non-medical: Not on file  Occupational History  . Not on file  Tobacco Use  . Smoking status: Former Smoker    Packs/day: 0.25    Years: 20.00    Pack years: 5.00    Types: Cigarettes    Last attempt to quit: 02/04/2015    Years since quitting: 2.3  . Smokeless tobacco: Never Used  . Tobacco comment: Pt quit 11/08/2014.Pt had quit smoking for 13 years before starting back in 2010  Substance and Sexual Activity  . Alcohol use: No    Alcohol/week: 0.0 oz  . Drug use: No  . Sexual activity: No    Birth control/protection: Surgical  Other Topics Concern  . Not on file  Social History Narrative  . Not on file   No outpatient medications have been marked as taking for the 05/24/17 encounter (Office Visit) with McLean-Scocuzza, Nino Glow, MD.   Allergies  Allergen Reactions  . Vicodin [Hydrocodone-Acetaminophen] Other (See Comments)    Insomnia    Vitals:   05/24/17 1439  Weight: 156 lb 2 oz (70.8 kg)  Height: 5' 4.5" (1.638 m)   Recent Results (from the past 2160 hour(s))  Comprehensive metabolic panel     Status: None   Collection Time: 04/01/17  8:06 AM  Result Value Ref Range   Sodium 139 135 - 145 mEq/L   Potassium 3.9 3.5 - 5.1 mEq/L   Chloride 102 96 - 112 mEq/L   CO2 30 19 - 32 mEq/L   Glucose, Bld 96 70 - 99 mg/dL   BUN 11 6 - 23 mg/dL   Creatinine, Ser 0.81 0.40 - 1.20 mg/dL   Total Bilirubin 0.7 0.2 - 1.2 mg/dL   Alkaline Phosphatase 86 39 - 117 U/L   AST 16 0 - 37 U/L   ALT 18 0 - 35 U/L   Total Protein 6.4 6.0 - 8.3 g/dL   Albumin 3.7 3.5 - 5.2 g/dL    Calcium 8.6 8.4 - 10.5 mg/dL   GFR 74.48 >60.00 mL/min  Hemoglobin A1c  Status: Abnormal   Collection Time: 04/01/17  8:06 AM  Result Value Ref Range   Hgb A1c MFr Bld 6.8 (H) 4.6 - 6.5 %    Comment: Glycemic Control Guidelines for People with Diabetes:Non Diabetic:  <6%Goal of Therapy: <7%Additional Action Suggested:  >8%   Lipid panel     Status: Abnormal   Collection Time: 04/01/17  8:06 AM  Result Value Ref Range   Cholesterol 117 0 - 200 mg/dL    Comment: ATP III Classification       Desirable:  < 200 mg/dL               Borderline High:  200 - 239 mg/dL          High:  > = 240 mg/dL   Triglycerides 209.0 (H) 0.0 - 149.0 mg/dL    Comment: Normal:  <150 mg/dLBorderline High:  150 - 199 mg/dL   HDL 39.80 >39.00 mg/dL   VLDL 41.8 (H) 0.0 - 40.0 mg/dL   Total CHOL/HDL Ratio 3     Comment:                Men          Women1/2 Average Risk     3.4          3.3Average Risk          5.0          4.42X Average Risk          9.6          7.13X Average Risk          15.0          11.0                       NonHDL 77.54     Comment: NOTE:  Non-HDL goal should be 30 mg/dL higher than patient's LDL goal (i.e. LDL goal of < 70 mg/dL, would have non-HDL goal of < 100 mg/dL)  LDL cholesterol, direct     Status: None   Collection Time: 04/01/17  8:06 AM  Result Value Ref Range   Direct LDL 55.0 mg/dL    Comment: Optimal:  <100 mg/dLNear or Above Optimal:  100-129 mg/dLBorderline High:  130-159 mg/dLHigh:  160-189 mg/dLVery High:  >190 mg/dL  CBC with Differential/Platelet     Status: Abnormal   Collection Time: 04/05/17  8:45 AM  Result Value Ref Range   WBC 9.7 4.0 - 10.5 K/uL   RBC 4.19 3.87 - 5.11 Mil/uL   Hemoglobin 12.8 12.0 - 15.0 g/dL   HCT 38.0 36.0 - 46.0 %   MCV 90.7 78.0 - 100.0 fl   MCHC 33.6 30.0 - 36.0 g/dL   RDW 12.8 11.5 - 15.5 %   Platelets 281.0 150.0 - 400.0 K/uL   Neutrophils Relative % 58.3 43.0 - 77.0 %   Lymphocytes Relative 24.1 12.0 - 46.0 %   Monocytes  Relative 7.9 3.0 - 12.0 %   Eosinophils Relative 9.1 (H) 0.0 - 5.0 %   Basophils Relative 0.6 0.0 - 3.0 %   Neutro Abs 5.6 1.4 - 7.7 K/uL   Lymphs Abs 2.3 0.7 - 4.0 K/uL   Monocytes Absolute 0.8 0.1 - 1.0 K/uL   Eosinophils Absolute 0.9 (H) 0.0 - 0.7 K/uL   Basophils Absolute 0.1 0.0 - 0.1 K/uL  Sedimentation rate     Status: None   Collection Time: 04/05/17  8:45 AM  Result Value Ref Range   Sed Rate 5 0 - 30 mm/hr  Hepatitis B core antibody, total     Status: None   Collection Time: 05/24/17  3:37 PM  Result Value Ref Range   Hep B Core Total Ab NON-REACTIVE NON-REACTI  Hepatitis B surface antibody     Status: None   Collection Time: 05/24/17  3:37 PM  Result Value Ref Range   Hepatitis B-Post 62 > OR = 10 mIU/mL    Comment: . Patient has immunity to hepatitis B virus. . For additional information, please refer to http://education.[facility].com/faq/FAQ105 (This link is being provided for informational/ educational purposes only).   Hepatitis B surface antigen     Status: None   Collection Time: 05/24/17  3:37 PM  Result Value Ref Range   Hepatitis B Surface Ag NON-REACTIVE NON-REACTI  Hepatitis C antibody     Status: None   Collection Time: 05/24/17  3:37 PM  Result Value Ref Range   Hepatitis C Ab NON-REACTIVE NON-REACTI   SIGNAL TO CUT-OFF 0.01 <1.00  POCT urinalysis dipstick     Status: Abnormal   Collection Time: 05/24/17  3:45 PM  Result Value Ref Range   Color, UA yellow    Clarity, UA clear    Glucose, UA 500    Bilirubin, UA negative    Ketones, UA negative    Spec Grav, UA 1.015 1.010 - 1.025   Blood, UA trace-intact    pH, UA 5.5 5.0 - 8.0   Protein, UA 30    Urobilinogen, UA 0.2 0.2 or 1.0 E.U./dL   Nitrite, UA negative    Leukocytes, UA Negative Negative   Vitals:   05/24/17 1439  BP: (!) 158/90  Pulse: (!) 110  Temp: 98.3 F (36.8 C)  SpO2: 98%   Objective  Physical Exam  Constitutional: She is oriented to person, place, and time.   Cachetic appearance   HENT:  Head: Normocephalic and atraumatic.  Mouth/Throat: Oropharynx is clear and moist and mucous membranes are normal.  Eyes: Conjunctivae are normal. Pupils are equal, round, and reactive to light. No scleral icterus.  Cardiovascular: Normal heart sounds. Tachycardia present.  +tachycardia ST  Pulmonary/Chest: Effort normal and breath sounds normal.  Abdominal: Soft. Bowel sounds are normal. There is no tenderness.  Neurological: She is alert and oriented to person, place, and time.  Skin: Skin is warm, dry and intact.  Psychiatric: Mood, memory and judgment normal. She has a flat affect.  Nursing note and vitals reviewed.  Assessment   1. Fatigue and abnormal unintentional weight loss r/o malignancy vs hyperthyroidism vs other  2. Nausea and dysphagia  3. Back and muscle pain  4. Dizziness likely related to dehydrated and reduced po contributing to orthostasis and tachycardia  5. Uncontrolled HTN w/o medications today  6. HM  Plan  1.  -low TSH, elevated free T4. Lab unable to add on T3  Refer to Endocrine further w/u r/o hyperthyroidism as etiology   Work u with labs CMET, CBC, TSH, free T4, Hep B/C/HIV, SPEP, UPEP, ESR, CRP, UA. She does have DM but controlled Will do CT chest/ab/pelvis with abnormal CT 2013 + lymphadenopathy see HPI at that time CT imaging rec PET/CT if sx's continue consider if CT abnormal Mammogram had 03/06/17 negative  Last pap 30 years ago with no h/o abnormal s/p hysterectomy 27 years ago ovaries intact   Colonoscopy Dr. Candace Cruise [facility] [place] 05/12/15 no polyps repeat in 5 years. H/o duodenal polps 04/21/12.  2.  Consider EGD if sx's continue  Prn Phenergan  Thyroid exam today no gross abnormalities  Could consider am cortisol to w/u adrenal issues if sx's of nausea and wt loss continue  3. Back pain is not a new issue  See labs  4. Encouraged to increase po intake and hydration  5. See #4 encouraged to take medications.  ST today see  above  Recheck at f/u in 1 week she has not had any BP meds today  6. Review in future per pt eye exam 12/2016 of note   Follow up in 1 week  Provider: Dr. Olivia Mackie McLean-Scocuzza

## 2017-05-25 NOTE — Addendum Note (Signed)
Addended by: Arby Barrette on: 05/25/2017 08:17 AM   Modules accepted: Orders

## 2017-05-25 NOTE — Progress Notes (Signed)
Patient has [employer] and they are requiring me to upload additional information for both orders. Can you finish your note so I can upload to them please? Thanks!

## 2017-05-26 ENCOUNTER — Other Ambulatory Visit: Payer: Self-pay | Admitting: Internal Medicine

## 2017-05-26 LAB — PROTEIN ELECTROPHORESIS, SERUM
Albumin ELP: 3.7 g/dL — ABNORMAL LOW (ref 3.8–4.8)
Alpha 1: 0.3 g/dL (ref 0.2–0.3)
Alpha 2: 0.9 g/dL (ref 0.5–0.9)
Beta 2: 0.4 g/dL (ref 0.2–0.5)
Beta Globulin: 0.4 g/dL (ref 0.4–0.6)
Gamma Globulin: 1 g/dL (ref 0.8–1.7)
Total Protein: 6.7 g/dL (ref 6.1–8.1)

## 2017-05-26 LAB — PROTEIN ELECTROPHORESIS, URINE REFLEX
Albumin ELP, Urine: 14.8 %
Alpha-1-Globulin, U: 3.3 %
Alpha-2-Globulin, U: 19.8 %
Beta Globulin, U: 33.3 %
Gamma Globulin, U: 28.9 %
Protein, Ur: 15.5 mg/dL

## 2017-05-26 LAB — HEPATITIS B SURFACE ANTIBODY, QUANTITATIVE: Hepatitis B-Post: 62 m[IU]/mL (ref >=10)

## 2017-05-26 LAB — HEPATITIS B SURFACE ANTIGEN: Hepatitis B Surface Ag: NONREACTIVE

## 2017-05-26 LAB — HEPATITIS B CORE ANTIBODY, TOTAL: Hep B Core Total Ab: NONREACTIVE

## 2017-05-26 LAB — HEPATITIS C ANTIBODY
Hepatitis C Ab: NONREACTIVE
SIGNAL TO CUT-OFF: 0.01 (ref <1.00)

## 2017-05-26 NOTE — Addendum Note (Signed)
Addended by: Orland Mustard on: 05/26/2017 02:53 PM   Modules accepted: Orders

## 2017-05-29 ENCOUNTER — Other Ambulatory Visit: Payer: Self-pay | Admitting: Internal Medicine

## 2017-06-01 ENCOUNTER — Other Ambulatory Visit: Payer: Self-pay | Admitting: Internal Medicine

## 2017-06-03 ENCOUNTER — Encounter: Payer: Self-pay | Admitting: Internal Medicine

## 2017-06-03 ENCOUNTER — Ambulatory Visit
Admission: RE | Admit: 2017-06-03 | Discharge: 2017-06-03 | Disposition: A | Discharge status: Home / Self Care | Payer: Medicare HMO | Source: Ambulatory Visit | Attending: Internal Medicine | Admitting: Internal Medicine

## 2017-06-03 DIAGNOSIS — R1110 Vomiting, unspecified: Secondary | ICD-10-CM | POA: Diagnosis not present

## 2017-06-03 DIAGNOSIS — R634 Abnormal weight loss: Secondary | ICD-10-CM | POA: Diagnosis not present

## 2017-06-03 DIAGNOSIS — J942 Hemothorax: Secondary | ICD-10-CM | POA: Diagnosis not present

## 2017-06-03 MED ORDER — IOPAMIDOL (ISOVUE-300) INJECTION 61%
100.0000 mL | Freq: Once | INTRAVENOUS | Status: AC | PRN
  Administered 2017-06-03: 100 mL via INTRAVENOUS

## 2017-06-14 ENCOUNTER — Ambulatory Visit: Payer: Medicare HMO | Admitting: Internal Medicine

## 2017-06-20 ENCOUNTER — Other Ambulatory Visit: Payer: Self-pay | Admitting: Internal Medicine

## 2017-07-05 ENCOUNTER — Telehealth: Payer: Self-pay | Admitting: Radiology

## 2017-07-05 DIAGNOSIS — E119 Type 2 diabetes mellitus without complications: Secondary | ICD-10-CM

## 2017-07-05 DIAGNOSIS — F419 Anxiety disorder, unspecified: Secondary | ICD-10-CM

## 2017-07-05 DIAGNOSIS — F5105 Insomnia due to other mental disorder: Principal | ICD-10-CM

## 2017-07-05 DIAGNOSIS — Z794 Long term (current) use of insulin: Secondary | ICD-10-CM

## 2017-07-05 NOTE — Addendum Note (Signed)
Addended by: Crecencio Mc on: 07/05/2017 09:27 AM   Modules accepted: Orders

## 2017-07-05 NOTE — Telephone Encounter (Signed)
Pt coming in Wednesday for labs, please place future order. Thank you.

## 2017-07-05 NOTE — Telephone Encounter (Signed)
Fasting labs ordered for diabetes follow up.

## 2017-07-07 ENCOUNTER — Other Ambulatory Visit (INDEPENDENT_AMBULATORY_CARE_PROVIDER_SITE_OTHER): Payer: Medicare HMO

## 2017-07-07 DIAGNOSIS — Z794 Long term (current) use of insulin: Secondary | ICD-10-CM | POA: Diagnosis not present

## 2017-07-07 DIAGNOSIS — E119 Type 2 diabetes mellitus without complications: Secondary | ICD-10-CM

## 2017-07-07 LAB — COMPREHENSIVE METABOLIC PANEL
ALT: 24 U/L (ref 0–35)
AST: 22 U/L (ref 0–37)
Albumin: 3.6 g/dL (ref 3.5–5.2)
Alkaline Phosphatase: 110 U/L (ref 39–117)
BUN: 13 mg/dL (ref 6–23)
CO2: 27 mEq/L (ref 19–32)
Calcium: 9.1 mg/dL (ref 8.4–10.5)
Chloride: 99 mEq/L (ref 96–112)
Creatinine, Ser: 0.94 mg/dL (ref 0.40–1.20)
GFR: 62.68 mL/min (ref >60.00)
Glucose, Bld: 108 mg/dL — ABNORMAL HIGH (ref 70–99)
Potassium: 3.2 mEq/L — ABNORMAL LOW (ref 3.5–5.1)
Sodium: 139 mEq/L (ref 135–145)
Total Bilirubin: 0.6 mg/dL (ref 0.2–1.2)
Total Protein: 6.5 g/dL (ref 6.0–8.3)

## 2017-07-07 LAB — LIPID PANEL
Cholesterol: 131 mg/dL (ref 0–200)
HDL: 28.3 mg/dL — ABNORMAL LOW (ref >39.00)
NonHDL: 102.45
Total CHOL/HDL Ratio: 5
Triglycerides: 233 mg/dL — ABNORMAL HIGH (ref 0.0–149.0)
VLDL: 46.6 mg/dL — ABNORMAL HIGH (ref 0.0–40.0)

## 2017-07-07 LAB — LDL CHOLESTEROL, DIRECT: Direct LDL: 81 mg/dL

## 2017-07-07 LAB — MICROALBUMIN / CREATININE URINE RATIO
Creatinine,U: 146 mg/dL
Microalb Creat Ratio: 1.1 mg/g (ref 0.0–30.0)
Microalb, Ur: 1.6 mg/dL (ref 0.0–1.9)

## 2017-07-07 LAB — HEMOGLOBIN A1C: Hgb A1c MFr Bld: 7.4 % — ABNORMAL HIGH (ref 4.6–6.5)

## 2017-07-08 ENCOUNTER — Encounter: Payer: Self-pay | Admitting: Internal Medicine

## 2017-07-09 ENCOUNTER — Ambulatory Visit: Payer: Medicare HMO | Admitting: Internal Medicine

## 2017-07-09 ENCOUNTER — Encounter: Payer: Self-pay | Admitting: Internal Medicine

## 2017-07-09 VITALS — BP 128/76 | HR 88 | Temp 98.3°F | Resp 16 | Ht 64.5 in | Wt 151.4 lb

## 2017-07-09 DIAGNOSIS — R110 Nausea: Secondary | ICD-10-CM | POA: Diagnosis not present

## 2017-07-09 DIAGNOSIS — M5441 Lumbago with sciatica, right side: Secondary | ICD-10-CM

## 2017-07-09 DIAGNOSIS — M5442 Lumbago with sciatica, left side: Secondary | ICD-10-CM | POA: Diagnosis not present

## 2017-07-09 DIAGNOSIS — M5387 Other specified dorsopathies, lumbosacral region: Secondary | ICD-10-CM | POA: Diagnosis not present

## 2017-07-09 DIAGNOSIS — R112 Nausea with vomiting, unspecified: Secondary | ICD-10-CM

## 2017-07-09 DIAGNOSIS — R1310 Dysphagia, unspecified: Secondary | ICD-10-CM

## 2017-07-09 DIAGNOSIS — E119 Type 2 diabetes mellitus without complications: Secondary | ICD-10-CM

## 2017-07-09 DIAGNOSIS — R1319 Other dysphagia: Secondary | ICD-10-CM

## 2017-07-09 DIAGNOSIS — Z794 Long term (current) use of insulin: Secondary | ICD-10-CM | POA: Diagnosis not present

## 2017-07-09 DIAGNOSIS — G8929 Other chronic pain: Secondary | ICD-10-CM

## 2017-07-09 DIAGNOSIS — I10 Essential (primary) hypertension: Secondary | ICD-10-CM | POA: Diagnosis not present

## 2017-07-09 MED ORDER — ONDANSETRON HCL 4 MG PO TABS: 4.0000 mg | ORAL_TABLET | Freq: Three times a day (TID) | ORAL | 0 refills | Status: DC | PRN

## 2017-07-09 MED ORDER — TRAMADOL HCL 50 MG PO TABS: 50.0000 mg | ORAL_TABLET | Freq: Four times a day (QID) | ORAL | 5 refills | Status: DC | PRN

## 2017-07-09 MED ORDER — OMEPRAZOLE 40 MG PO CPDR: 40.0000 mg | DELAYED_RELEASE_CAPSULE | Freq: Every day | ORAL | 0 refills | Status: DC

## 2017-07-09 NOTE — Patient Instructions (Addendum)
I have refilled the tramadol to [facility] for your back pain EVERY 6 HOURS AS NEEDED.  STOP THE MELOXICAM BECAUSE IT MAY BE CAUSING YOUR STOMACH PAIN   REFERRAL TO DR NUDELMAN     REFERRAL TO GASTROENTEROLOGY  HAS BEEN ORDERED ALONG WITH A BARIUM STUDY OF YOUR ESOPHAGUS AND SMALL INTESTINES  yOU HAVE BEEN REFERRED TO [facility] BECAUSE OF YOUR ABNORMAL THYROID TESTS   OMEPRAZOLE OSRE INCREASED TO 40 MG.      ZOFRAN (ODANSETRON ) AS NEEDED FOR NAUSEA

## 2017-07-09 NOTE — Progress Notes (Signed)
Subjective:  Patient ID: Maria Hinton, female    DOB: Jan 28, 1946  Age: 72 y.o. MRN: [id number]  CC: The primary encounter diagnosis was Non-intractable vomiting with nausea, unspecified vomiting type. Diagnoses of Esophageal dysphagia, Sciatica associated with disorder of lumbosacral spine, Nausea, Chronic midline low back pain with bilateral sciatica, Type 2 diabetes mellitus without complication, with long-term current use of insulin ([place]), and HTN (hypertension), benign were also pertinent to this visit.  HPI Maria Hinton presents for 3 month follow up on diabetes.  Patient has SEVERAL unrelated complaints today.    1) DM:  Patient is following a low glycemic index diet and taking all prescribed medications regularly without side effects.  Fasting sugars have been under less than 140 most of the time and post prandials have been under 160 except on rare occasions. Patient is exercising about 3 times per week and intentionally trying to lose weight .  Patient has had an eye exam in the last 12 months and checks feet regularly for signs of infection.  Patient does not walk barefoot outside,  And denies an numbness tingling or burning in feet. Patient is up to date on all recommended vaccinations  2)  She has had recurrent nausea with vomiting  FOR THE PAST MONTH,  MOST OF THE TIME she reports DRY HEAVES AND PHLEGM,  BUT has also had vomiting post prandially. .  The nausea and vomiting is occurring DAILY.  HAD AN EARLIER APPOINTMENT WHICH WAS SNOWED OUT. NOT HAVING ABDOMINAL PAIN . THE NAUSEA IS INTERMITTENT .  Has been regurgitating liquids for several months  Last colonoscopy 2016 by Dr Verdie Shire.  ,  No history of esophageal stricture . Alternates between diarrhea and constipation  Reports recurrent moderate  lower abdominal pain that occurs prior to stooling.  The pain is relieved with defecation .    Has been taking omeprazole 20 mg daily in the morning , for the past several years.     Recent CT of abd pelvis done by Dr Aundra Dubin on Nov 19 for same:  Unrevealing findings of Fatty liver discussed today   3) Low back pain:  Previously intermittent,   now  constant and radiates to both legs down to the ankles  Present 24/7 and not positional,  (occurs when standing and when  lying down).    Has seen [facility] in the past  Had a L3-4 laminectomy by him  Over 5 years ago     Outpatient Medications Prior to Visit  Medication Sig Dispense Refill  . albuterol (PROVENTIL HFA;VENTOLIN HFA) 108 (90 BASE) MCG/ACT inhaler Inhale 2 puffs into the lungs every 6 (six) hours as needed for wheezing. 6.7 g 11  . ALPRAZolam (XANAX) 0.25 MG tablet TAKE 1 TABLET BY MOUTH TWICE A DAY AS NEEDED 45 tablet 1  . aspirin EC 81 MG tablet Take 81 mg by mouth daily.    Marland Kitchen atenolol (TENORMIN) 50 MG tablet TAKE 1 TABLET (50 MG TOTAL) BY MOUTH 2 (TWO) TIMES DAILY. 180 tablet 3  . atenolol (TENORMIN) 50 MG tablet TAKE 1 TABLET (50 MG TOTAL) BY MOUTH 2 (TWO) TIMES DAILY. 180 tablet 3  . atorvastatin (LIPITOR) 40 MG tablet TAKE 1 TABLET BY MOUTH DAILY AT 6 PM. 90 tablet 3  . atorvastatin (LIPITOR) 40 MG tablet TAKE 1 TABLET BY MOUTH DAILY AT 6 PM. 90 tablet 3  . BD PEN NEEDLE NANO U/F 32G X 4 MM MISC USE DAILY AS DIRECTED 100 each 1  .  Blood Glucose Monitoring Suppl (ONE TOUCH ULTRA SYSTEM KIT) W/DEVICE KIT Pt needs OneTouch Ultra Blue glucometer dx 250.02 1 each 0  . Fluticasone-Salmeterol (ADVAIR DISKUS) 500-50 MCG/DOSE AEPB Inhale 1 puff into the lungs 2 (two) times daily. 60 each 0  . glipiZIDE (GLUCOTROL) 10 MG tablet TAKE 1 TABLET BY MOUTH 2 TIMES DAILY BEFORE A MEAL. 180 tablet 3  . glipiZIDE (GLUCOTROL) 10 MG tablet TAKE 1 TABLET BY MOUTH 2 TIMES DAILY BEFORE A MEAL. 180 tablet 3  . glucose blood (ONE TOUCH ULTRA TEST) test strip Use as instructed bid, has OneTouch Ultra Blue  dx: 250.02 100 each 3  . Insulin Detemir (LEVEMIR FLEXPEN) 100 UNIT/ML Pen Inject 20 Units into the skin daily at 10 pm. 15 mL 11   . Lancets (ONETOUCH ULTRASOFT) lancets Use as instructed bid dx 250.02 100 each 3  . lisinopril (PRINIVIL,ZESTRIL) 10 MG tablet TAKE 1 TABLET BY MOUTH DAILY 90 tablet 1  . metFORMIN (GLUCOPHAGE) 1000 MG tablet TAKE 1 TABLET BY MOUTH 2 TIMES DAILY WITH A MEAL. 180 tablet 3  . metFORMIN (GLUCOPHAGE) 1000 MG tablet TAKE 1 TABLET BY MOUTH 2 TIMES DAILY WITH A MEAL. 180 tablet 3  . promethazine (PHENERGAN) 12.5 MG tablet Take 1 tablet (12.5 mg total) every 8 (eight) hours as needed by mouth for nausea or vomiting. 20 tablet 0  . hydrochlorothiazide (HYDRODIURIL) 12.5 MG tablet Take 1 tablet (12.5 mg total) by mouth daily. 90 tablet 3  . hydrochlorothiazide (HYDRODIURIL) 12.5 MG tablet TAKE 1 TABLET (12.5 MG TOTAL) BY MOUTH DAILY. 90 tablet 3  . meloxicam (MOBIC) 7.5 MG tablet TAKE 1 TABLET (7.5 MG TOTAL) BY MOUTH DAILY.  3  . meloxicam (MOBIC) 7.5 MG tablet TAKE 1 TABLET (7.5 MG TOTAL) BY MOUTH DAILY. 90 tablet 3  . omeprazole (PRILOSEC) 20 MG capsule TAKE 1 CAPSULE BY MOUTH EVERY DAY 90 capsule 0  . traMADol (ULTRAM) 50 MG tablet Take 1 tablet (50 mg total) by mouth every 8 (eight) hours as needed. (Patient not taking: Reported on 07/09/2017) 120 tablet 0   No facility-administered medications prior to visit.     Review of Systems;  Patient denies headache, fevers, malaise, skin rash, eye pain, sinus congestion and sinus pain, sore throat, hemoptysis , cough, dyspnea, wheezing, chest pain, palpitations, orthopnea, edema,  melena, diarrhea, constipation, flank pain, dysuria, hematuria, urinary  Frequency, nocturia, numbness, tingling, seizures,  Focal weakness, Loss of consciousness,  Tremor, insomnia, depression, anxiety, and suicidal ideation.      Objective:  BP 128/76 (BP Location: Left Arm, Patient Position: Sitting, Cuff Size: Normal)   Pulse 88   Temp 98.3 F (36.8 C) (Oral)   Resp 16   Ht 5' 4.5" (1.638 m)   Wt 151 lb 6.4 oz (68.7 kg)   SpO2 95%   BMI 25.59 kg/m   BP Readings from  Last 3 Encounters:  07/09/17 128/76  05/24/17 (!) 158/90  04/05/17 128/72    Wt Readings from Last 3 Encounters:  07/09/17 151 lb 6.4 oz (68.7 kg)  05/24/17 156 lb 2 oz (70.8 kg)  04/05/17 165 lb 3.2 oz (74.9 kg)    General appearance: alert, cooperative and appears stated age Ears: normal TM's and external ear canals both ears Throat: lips, mucosa, and tongue normal; teeth and gums normal Neck: no adenopathy, no carotid bruit, supple, symmetrical, trachea midline and thyroid not enlarged, symmetric, no tenderness/mass/nodules Back: symmetric, no curvature. ROM normal. No CVA tenderness. Lungs: clear to auscultation bilaterally Heart:  regular rate and rhythm, S1, S2 normal, no murmur, click, rub or gallop Abdomen: soft, non-tender; bowel sounds normal; no masses,  no organomegaly Pulses: 2+ and symmetric Skin: Skin color, texture, turgor normal. No rashes or lesions Lymph nodes: Cervical, supraclavicular, and axillary nodes normal.  Lab Results  Component Value Date   HGBA1C 7.4 (H) 07/07/2017   HGBA1C 6.8 (H) 04/01/2017   HGBA1C 7.9 (H) 12/23/2016    Lab Results  Component Value Date   CREATININE 0.94 07/07/2017   CREATININE 0.91 05/24/2017   CREATININE 0.81 04/01/2017    Lab Results  Component Value Date   WBC 7.1 05/24/2017   HGB 13.3 05/24/2017   HCT 40.2 05/24/2017   PLT 272.0 05/24/2017   GLUCOSE 108 (H) 07/07/2017   CHOL 131 07/07/2017   TRIG 233.0 (H) 07/07/2017   HDL 28.30 (L) 07/07/2017   LDLDIRECT 81.0 07/07/2017   LDLCALC 59 12/23/2016   ALT 24 07/07/2017   AST 22 07/07/2017   NA 139 07/07/2017   K 3.2 (L) 07/07/2017   CL 99 07/07/2017   CREATININE 0.94 07/07/2017   BUN 13 07/07/2017   CO2 27 07/07/2017   TSH <0.01 Repeated and verified X2. (L) 05/24/2017   INR 0.9 11/21/2012   HGBA1C 7.4 (H) 07/07/2017   MICROALBUR 1.6 07/07/2017    Ct Chest W Contrast  Result Date: 06/03/2017 CLINICAL DATA:  Unintended weight loss. Nausea, vomiting,  diarrhea, loss of appetite. EXAM: CT CHEST, ABDOMEN, AND PELVIS WITH CONTRAST TECHNIQUE: Multidetector CT imaging of the chest, abdomen and pelvis was performed following the standard protocol during bolus administration of intravenous contrast. CONTRAST:  153m ISOVUE-300 IOPAMIDOL (ISOVUE-300) INJECTION 61% COMPARISON:  Chest CT 03/08/2014. CT abdomen and pelvis, 04/25/2012. FINDINGS: CT CHEST FINDINGS Cardiovascular: Heart is normal size. Aorta is normal caliber. Calcifications in the aortic arch. No dissection. Mediastinum/Nodes: No mediastinal, hilar, or axillary adenopathy. Lungs/Pleura: Biapical scarring. Postoperative changes in the left hemithorax. Scarring at the left base. No acute airspace opacities or effusions. Musculoskeletal: No acute bony abnormality. Chest wall soft tissues are unremarkable. CT ABDOMEN PELVIS FINDINGS Hepatobiliary: Suspect mild fatty infiltration of the liver. No focal hepatic abnormality. Gallbladder unremarkable. Pancreas: No focal abnormality or ductal dilatation. Spleen: No focal abnormality.  Normal size. Adrenals/Urinary Tract: No adrenal abnormality. No focal renal abnormality. No stones or hydronephrosis. Urinary bladder is unremarkable. Stomach/Bowel: Appendix is normal. Stomach, large and small bowel grossly unremarkable. Vascular/Lymphatic: Aortic and iliac calcifications. No evidence of aneurysm or adenopathy. Reproductive: Prior hysterectomy.  No adnexal masses. Other: No free fluid or free air. Musculoskeletal: Prior left hip replacement. Degenerative changes in the lumbar spine. No acute bony abnormality. IMPRESSION: No acute findings in the chest, abdomen or pelvis. No explanation for the patient's unintended weight loss. Suspect mild fatty infiltration of the liver. Electronically Signed   By: KRolm BaptiseM.D.   On: 06/03/2017 08:54   Ct Abdomen Pelvis W Contrast  Result Date: 06/03/2017 CLINICAL DATA:  Unintended weight loss. Nausea, vomiting, diarrhea,  loss of appetite. EXAM: CT CHEST, ABDOMEN, AND PELVIS WITH CONTRAST TECHNIQUE: Multidetector CT imaging of the chest, abdomen and pelvis was performed following the standard protocol during bolus administration of intravenous contrast. CONTRAST:  1028mISOVUE-300 IOPAMIDOL (ISOVUE-300) INJECTION 61% COMPARISON:  Chest CT 03/08/2014. CT abdomen and pelvis, 04/25/2012. FINDINGS: CT CHEST FINDINGS Cardiovascular: Heart is normal size. Aorta is normal caliber. Calcifications in the aortic arch. No dissection. Mediastinum/Nodes: No mediastinal, hilar, or axillary adenopathy. Lungs/Pleura: Biapical scarring. Postoperative changes in the left  hemithorax. Scarring at the left base. No acute airspace opacities or effusions. Musculoskeletal: No acute bony abnormality. Chest wall soft tissues are unremarkable. CT ABDOMEN PELVIS FINDINGS Hepatobiliary: Suspect mild fatty infiltration of the liver. No focal hepatic abnormality. Gallbladder unremarkable. Pancreas: No focal abnormality or ductal dilatation. Spleen: No focal abnormality.  Normal size. Adrenals/Urinary Tract: No adrenal abnormality. No focal renal abnormality. No stones or hydronephrosis. Urinary bladder is unremarkable. Stomach/Bowel: Appendix is normal. Stomach, large and small bowel grossly unremarkable. Vascular/Lymphatic: Aortic and iliac calcifications. No evidence of aneurysm or adenopathy. Reproductive: Prior hysterectomy.  No adnexal masses. Other: No free fluid or free air. Musculoskeletal: Prior left hip replacement. Degenerative changes in the lumbar spine. No acute bony abnormality. IMPRESSION: No acute findings in the chest, abdomen or pelvis. No explanation for the patient's unintended weight loss. Suspect mild fatty infiltration of the liver. Electronically Signed   By: Rolm Baptise M.D.   On: 06/03/2017 08:54    Assessment & Plan:   Problem List Items Addressed This Visit    Back pain    Symptoms now suggestive of spinal stenosis..  Tramadol  rx refilled for pain management.  histoyr of lumbar laminectomy 5 years go by [facility].  Referral in progress       Relevant Medications   traMADol (ULTRAM) 50 MG tablet   DM type 2 (diabetes mellitus, type 2) (HCC)    Slight loss of control  on current medications .  No changes today given  Patient's poor appetite and recurrent N/V.  Patient has no microalbuminuria. Patient is tolerating statin therapy for CAD risk reduction and on ACE/ARB for renal protection and hypertension   Lab Results  Component Value Date   HGBA1C 7.4 (H) 07/07/2017   . Lab Results  Component Value Date   MICROALBUR 1.6 07/07/2017         Dysphagia    For liquids.  UGI/SBFT and GI referral both in progress to rule out esophageal mass, stricture given  History of tobacco abuse      Relevant Orders   DG UGI W/SMALL BOWEL   Ambulatory referral to Gastroenterology   HTN (hypertension), benign    Well controlled on current regimen. Renal function stable, but potassium is low.  Taking hctz. Will stop hctz and follow readings,  Potassium supplementation x 3 days . Lab Results  Component Value Date   NA 139 07/07/2017   K 3.2 (L) 07/07/2017   CL 99 07/07/2017   CO2 27 07/07/2017   Lab Results  Component Value Date   CREATININE 0.94 07/07/2017         Nausea    With vomiting,  Accompanied by weight loss and dysphagia..  DDX includes GI malignancy given history of tobacco abuse.  Referral to GI for EGD. UGI /SBFT ordered as well.  Increase omeprazole to 40 mg daily, and advised to stop use  all NSAIDs .  zofran rx given        Other Visit Diagnoses    Non-intractable vomiting with nausea, unspecified vomiting type    -  Primary   Relevant Orders   DG UGI W/SMALL BOWEL   Ambulatory referral to Gastroenterology   Sciatica associated with disorder of lumbosacral spine       Relevant Orders   Ambulatory referral to Neurosurgery      I have discontinued Jesslyn F. [place]'s hydrochlorothiazide,  meloxicam, traMADol, hydrochlorothiazide, and meloxicam. I have also changed her omeprazole. Additionally, I am having her start on traMADol and  ondansetron. Lastly, I am having her maintain her glucose blood, ONE TOUCH ULTRA SYSTEM KIT, onetouch ultrasoft, albuterol, aspirin EC, Fluticasone-Salmeterol, atenolol, atorvastatin, glipiZIDE, metFORMIN, Insulin Detemir, lisinopril, BD PEN NEEDLE NANO U/F, ALPRAZolam, promethazine, atorvastatin, glipiZIDE, metFORMIN, and atenolol.  Meds ordered this encounter  Medications  . traMADol (ULTRAM) 50 MG tablet    Sig: Take 1 tablet (50 mg total) by mouth every 6 (six) hours as needed. FOR SPINAL STENOSIS PAIN    Dispense:  120 tablet    Refill:  5  . omeprazole (PRILOSEC) 40 MG capsule    Sig: Take 1 capsule (40 mg total) by mouth daily.    Dispense:  90 capsule    Refill:  0  . ondansetron (ZOFRAN) 4 MG tablet    Sig: Take 1 tablet (4 mg total) by mouth every 8 (eight) hours as needed for nausea or vomiting.    Dispense:  20 tablet    Refill:  0    Medications Discontinued During This Encounter  Medication Reason  . traMADol (ULTRAM) 50 MG tablet   . meloxicam (MOBIC) 7.5 MG tablet   . meloxicam (MOBIC) 7.5 MG tablet   . omeprazole (PRILOSEC) 20 MG capsule   . hydrochlorothiazide (HYDRODIURIL) 12.5 MG tablet   . hydrochlorothiazide (HYDRODIURIL) 12.5 MG tablet     Follow-up: No Follow-up on file.   Crecencio Mc, MD

## 2017-07-11 ENCOUNTER — Telehealth: Payer: Self-pay | Admitting: Internal Medicine

## 2017-07-11 MED ORDER — MAGNESIUM OXIDE 400 MG PO CAPS: ORAL_CAPSULE | ORAL | 0 refills | Status: DC

## 2017-07-11 MED ORDER — POTASSIUM CHLORIDE CRYS ER 20 MEQ PO TBCR: 20.0000 meq | EXTENDED_RELEASE_TABLET | Freq: Every day | ORAL | 0 refills | Status: DC

## 2017-07-11 NOTE — Assessment & Plan Note (Signed)
Well controlled on current regimen. Renal function stable, but potassium is low.  Taking hctz. Will stop hctz and follow readings,  Potassium supplementation x 3 days . Lab Results  Component Value Date   NA 139 07/07/2017   K 3.2 (L) 07/07/2017   CL 99 07/07/2017   CO2 27 07/07/2017   Lab Results  Component Value Date   CREATININE 0.94 07/07/2017

## 2017-07-11 NOTE — Assessment & Plan Note (Signed)
Slight loss of control  on current medications .  No changes today given  Patient's poor appetite and recurrent N/V.  Patient has no microalbuminuria. Patient is tolerating statin therapy for CAD risk reduction and on ACE/ARB for renal protection and hypertension   Lab Results  Component Value Date   HGBA1C 7.4 (H) 07/07/2017   . Lab Results  Component Value Date   MICROALBUR 1.6 07/07/2017

## 2017-07-11 NOTE — Assessment & Plan Note (Signed)
For liquids.  UGI/SBFT and GI referral both in progress to rule out esophageal mass, stricture given  History of tobacco abuse

## 2017-07-11 NOTE — Assessment & Plan Note (Signed)
Symptoms now suggestive of spinal stenosis..  Tramadol rx refilled for pain management.  histoyr of lumbar laminectomy 5 years go by [facility].  Referral in progress

## 2017-07-11 NOTE — Assessment & Plan Note (Addendum)
With vomiting,  Accompanied by weight loss and dysphagia..  DDX includes GI malignancy given history of tobacco abuse.  Referral to GI for EGD. UGI /SBFT ordered as well.  Increase omeprazole to 40 mg daily, and advised to stop use  all NSAIDs .  zofran rx given

## 2017-07-19 ENCOUNTER — Ambulatory Visit
Admission: RE | Admit: 2017-07-19 | Discharge: 2017-07-19 | Disposition: A | Discharge status: Home / Self Care | Payer: Medicare HMO | Source: Ambulatory Visit | Attending: Internal Medicine | Admitting: Internal Medicine

## 2017-07-19 DIAGNOSIS — R112 Nausea with vomiting, unspecified: Secondary | ICD-10-CM | POA: Insufficient documentation

## 2017-07-19 DIAGNOSIS — R1310 Dysphagia, unspecified: Secondary | ICD-10-CM | POA: Insufficient documentation

## 2017-07-19 DIAGNOSIS — R1319 Other dysphagia: Secondary | ICD-10-CM

## 2017-07-19 DIAGNOSIS — R1110 Vomiting, unspecified: Secondary | ICD-10-CM | POA: Diagnosis not present

## 2017-07-21 ENCOUNTER — Encounter: Payer: Self-pay | Admitting: Internal Medicine

## 2017-07-23 ENCOUNTER — Other Ambulatory Visit: Payer: Self-pay | Admitting: Internal Medicine

## 2017-07-25 NOTE — Telephone Encounter (Signed)
Chart closed

## 2017-07-26 NOTE — Telephone Encounter (Signed)
Faxed Rx to [facility].

## 2017-07-26 NOTE — Telephone Encounter (Signed)
Refilled: 05/03/2017 Last OV: 07/09/2017 Next OV: 10/08/2017

## 2017-07-30 DIAGNOSIS — M5136 Other intervertebral disc degeneration, lumbar region: Secondary | ICD-10-CM | POA: Diagnosis not present

## 2017-07-30 DIAGNOSIS — Z9889 Other specified postprocedural states: Secondary | ICD-10-CM | POA: Diagnosis not present

## 2017-07-30 DIAGNOSIS — M48062 Spinal stenosis, lumbar region with neurogenic claudication: Secondary | ICD-10-CM | POA: Diagnosis not present

## 2017-07-30 DIAGNOSIS — M4155 Other secondary scoliosis, thoracolumbar region: Secondary | ICD-10-CM | POA: Diagnosis not present

## 2017-07-30 DIAGNOSIS — M546 Pain in thoracic spine: Secondary | ICD-10-CM | POA: Diagnosis not present

## 2017-07-30 DIAGNOSIS — M4726 Other spondylosis with radiculopathy, lumbar region: Secondary | ICD-10-CM | POA: Diagnosis not present

## 2017-08-02 ENCOUNTER — Encounter: Payer: Self-pay | Admitting: Gastroenterology

## 2017-08-02 ENCOUNTER — Other Ambulatory Visit: Payer: Self-pay

## 2017-08-02 ENCOUNTER — Other Ambulatory Visit: Payer: Self-pay | Admitting: Internal Medicine

## 2017-08-02 ENCOUNTER — Ambulatory Visit: Payer: Medicare HMO | Admitting: Gastroenterology

## 2017-08-02 ENCOUNTER — Telehealth: Payer: Self-pay | Admitting: Internal Medicine

## 2017-08-02 VITALS — BP 112/54 | HR 84 | Ht 64.5 in | Wt 151.6 lb

## 2017-08-02 DIAGNOSIS — K5900 Constipation, unspecified: Secondary | ICD-10-CM | POA: Diagnosis not present

## 2017-08-02 DIAGNOSIS — R1310 Dysphagia, unspecified: Secondary | ICD-10-CM

## 2017-08-02 DIAGNOSIS — R634 Abnormal weight loss: Secondary | ICD-10-CM

## 2017-08-02 DIAGNOSIS — K760 Fatty (change of) liver, not elsewhere classified: Secondary | ICD-10-CM

## 2017-08-02 DIAGNOSIS — R1319 Other dysphagia: Secondary | ICD-10-CM

## 2017-08-02 MED ORDER — POTASSIUM CHLORIDE CRYS ER 20 MEQ PO TBCR: 20.0000 meq | EXTENDED_RELEASE_TABLET | Freq: Two times a day (BID) | ORAL | 0 refills | Status: DC

## 2017-08-02 NOTE — Telephone Encounter (Signed)
-----   Message from Virgel Manifold, MD sent at 08/02/2017  9:36 AM EST ----- Hi Dr. Derrel Nip,  Thank you for referring the patient to [place].  We plan on doing an EGD for her dysphagia.  She states she was going to follow-up with your office regarding her last lab work.  Would you be planning on replacing her potassium?  I have also encouraged her to call her endocrinologist to make an appointment soon given her abnormal thyroid function tests.  We might need to change her EGD based on when her endocrinologist appointment ends up being.  Thank you again for the referral.

## 2017-08-02 NOTE — Progress Notes (Addendum)
Vonda Antigua [address]  [place]  [place], [place] [address]  Main: [phone]  Fax: [phone]   Gastroenterology Consultation  Referring Provider:     Crecencio Mc, MD Primary Care Physician:  Crecencio Mc, MD Primary Gastroenterologist:  Dr. Vonda Antigua Reason for Consultation:     Dysphagia        HPI:   Maria Hinton is a 72 y.o. y/o female referred for consultation & management  by Dr. Crecencio Mc, MD.  Patient reports 59-monthhistory of dysphagia to solids.  States after eating products like meat or bread, "it gets hung in my throat", and at times she has to swallow a lot of water for it to go down, and at times she has to bring it back up.  At times she reports choking on liquids as well.  Most symptoms occur only after eating, however, also reports a globus sensation at times.  States that her heartburn is well controlled with her daily Prilosec.  Prior to that states she had a lot of heartburn.  Also reports loss of appetite over the last 6 months.  States she just does not feel like eating.  Only reports one episode of emesis, she states that day she had a lot of liquid intake including multiple sodas and coffee.  No hematemesis.  She states she was 174 pounds a year ago, and today she is 151 pounds.  This is consistent with her documented weights as well.  Stopped smoking a year ago.  Does not drink alcohol or use drugs.  No immediate family history of cancer.  Last colonoscopy was in 2016 by Dr. OCandace Cruise no polyps reported, extent of exam cecum, good prep, repeat recommended in 5 years.  Patient states she had a colonoscopy and EGD over.  Unsure what the indication for that EGD was at that time.  Denies any similar symptoms at that time.  Also reports ongoing constipation.  Takes Dulcolax as needed, but reports hard stools with it.  Does not eat a high-fiber diet.  No blood in stool.  No melena.  No hematemesis.  Past Medical History:  Diagnosis  Date  . Actinic keratoses    follows with dermatology   . Arthritis   . Asthma   . Chronic back pain    DDD,herniated disc/spondylosis/radiculopathy  . COPD (chronic obstructive pulmonary disease) ([facility]   . Diabetes mellitus   . Early cataracts, bilateral    Dr. RRuthine Dose- [facility] . GERD (gastroesophageal reflux disease)    takes Omeprazole daily  . Hemorrhoids   . History of colonic polyps    colonoscopy 04/2012 - Dr OCandace Cruise . Hyperlipidemia    takes Pravastatin daily  . Hypertension    takes Metoprolol/HCTZ daily  . Insomnia   . Joint pain    fingers  . Joint swelling   . Pneumonia    x 2 ;in Dec 2010/2011  . PONV (postoperative nausea and vomiting)     Past Surgical History:  Procedure Laterality Date  . ABDOMINAL HYSTERECTOMY     at age 72 . BACK SURGERY  20+yrs ago  . COLONOSCOPY    . COLONOSCOPY N/A 05/13/2015   Procedure: COLONOSCOPY;  Surgeon: PHulen Luster MD;  [facility];  Service: Gastroenterology;  Laterality: N/A;  . ESOPHAGOGASTRODUODENOSCOPY  2011  . FOOT SURGERY  2011   bunionectomy and knot removed from bottom of both feet  . JOINT REPLACEMENT Left  2014   hip  Kraskinski  . LUMBAR LAMINECTOMY/DECOMPRESSION MICRODISCECTOMY  09/10/2011   Procedure: LUMBAR LAMINECTOMY/DECOMPRESSION MICRODISCECTOMY 1 LEVEL;  Surgeon: Hosie Spangle, MD;  Location: [facility];  Service: Neurosurgery;  Laterality: Right;  RIGHT Lumbar  Laminotomy and microdiskectomy Lumbar Four-Five  . LUMBAR LAMINECTOMY/DECOMPRESSION MICRODISCECTOMY N/A 06/26/2015   Procedure: LUMBAR LAMINECTOMY/DECOMPRESSION MICRODISCECTOMY 1 LEVEL;  Surgeon: Jovita Gamma, MD;  Location: [facility];  Service: Neurosurgery;  Laterality: N/A;  L3 and L4 Laminectomies  . MELANOMA EXCISION  2011   on nose/back on neck  . THORACIC DISC SURGERY  at age 61 and 65    mass of veins that had gotten into muscle and wrapped around rib;3 ribs also removed from left side  . TONSILLECTOMY   as a child   and adenoids   . TOTAL HIP ARTHROPLASTY Left 2014   Dr. Mack Guise  . TUBAL LIGATION    . VAGINA SURGERY     vaginal wall ruptured     Prior to Admission medications   Medication Sig Start Date End Date Taking? Authorizing Provider  albuterol (PROVENTIL HFA;VENTOLIN HFA) 108 (90 BASE) MCG/ACT inhaler Inhale 2 puffs into the lungs every 6 (six) hours as needed for wheezing. 12/18/14  Yes Mungal, Vishal, MD  ALPRAZolam (XANAX) 0.25 MG tablet TAKE 1 TABLET BY MOUTH TWICE A DAY AS NEEDED 07/26/17  Yes Crecencio Mc, MD  aspirin EC 81 MG tablet Take 81 mg by mouth daily.   Yes [provider]  atenolol (TENORMIN) 50 MG tablet TAKE 1 TABLET (50 MG TOTAL) BY MOUTH 2 (TWO) TIMES DAILY. 06/05/16  Yes Crecencio Mc, MD  atorvastatin (LIPITOR) 40 MG tablet TAKE 1 TABLET BY MOUTH DAILY AT 6 PM. 06/05/16  Yes Crecencio Mc, MD  BD PEN NEEDLE NANO U/F 32G X 4 MM MISC USE DAILY AS DIRECTED 03/15/17  Yes Crecencio Mc, MD  Blood Glucose Monitoring Suppl (ONE TOUCH ULTRA SYSTEM KIT) W/DEVICE KIT Pt needs OneTouch Ultra Blue glucometer dx 250.02 05/26/13  Yes Rey, Raquel M, NP  glipiZIDE (GLUCOTROL) 10 MG tablet TAKE 1 TABLET BY MOUTH 2 TIMES DAILY BEFORE A MEAL. 06/05/16  Yes Crecencio Mc, MD  glucose blood (ONE TOUCH ULTRA TEST) test strip Use as instructed bid, has OneTouch Ultra Blue  dx: 250.02 05/26/13  Yes Rey, Raquel M, NP  hydrochlorothiazide (MICROZIDE) 12.5 MG capsule Take 12.5 mg by mouth daily.   Yes [provider]  Insulin Detemir (LEVEMIR FLEXPEN) 100 UNIT/ML Pen Inject 20 Units into the skin daily at 10 pm. 12/28/16  Yes Crecencio Mc, MD  Lancets [facility] ULTRASOFT) lancets Use as instructed bid dx 250.02 05/26/13  Yes Rey, Raquel M, NP  lisinopril (PRINIVIL,ZESTRIL) 10 MG tablet TAKE 1 TABLET BY MOUTH DAILY 03/01/17  Yes Crecencio Mc, MD  metFORMIN (GLUCOPHAGE) 1000 MG tablet TAKE 1 TABLET BY MOUTH 2 TIMES DAILY WITH A MEAL. 06/05/16  Yes Crecencio Mc, MD  omeprazole (PRILOSEC) 40 MG capsule Take 1 capsule (40 mg total) by mouth daily. 07/09/17  Yes Crecencio Mc, MD  traMADol (ULTRAM) 50 MG tablet Take 1 tablet (50 mg total) by mouth every 6 (six) hours as needed. FOR SPINAL STENOSIS PAIN 07/09/17  Yes Crecencio Mc, MD    Family History  Problem Relation Age of Onset  . Heart disease Brother        valve replacement  . COPD Brother   . COPD Mother  Emphysema  . Diabetes Mother   . Heart disease Father   . Seizures Father   . Cancer Father        Lung cancer with brain metastasis  . Asthma Brother   . COPD Son 73  . Cancer Other        aunt ? maternal vs paternal side lung cancer   . Anesthesia problems Neg Hx   . Hypotension Neg Hx   . Malignant hyperthermia Neg Hx   . Pseudochol deficiency Neg Hx      Social History   Tobacco Use  . Smoking status: Former Smoker    Packs/day: 0.25    Years: 20.00    Pack years: 5.00    Types: Cigarettes    Last attempt to quit: 02/04/2015    Years since quitting: 2.4  . Smokeless tobacco: Never Used  . Tobacco comment: Pt quit 11/08/2014.Pt had quit smoking for 13 years before starting back in 2010; per 05/2017 visit quit smoking 2017 smoked total 25 years 1 ppd   Substance Use Topics  . Alcohol use: No    Alcohol/week: 0.0 oz  . Drug use: No    Allergies as of 08/02/2017 - Review Complete 08/02/2017  Allergen Reaction Noted  . Vicodin [hydrocodone-acetaminophen] Other (See Comments) 09/08/2011    Review of Systems:    All systems reviewed and negative except where noted in HPI.   Physical Exam:  BP (!) 112/54   Pulse 84   Ht 5' 4.5" (1.638 m)   Wt 151 lb 9.6 oz (68.8 kg)   BMI 25.62 kg/m  No LMP recorded. Patient has had a hysterectomy. Psych:  Alert and cooperative. Normal mood and affect. General:   Alert,  Well-developed, well-nourished, pleasant and cooperative in NAD Head:  Normocephalic and atraumatic. Eyes:  Sclera clear, no icterus.   Conjunctiva  pink. Ears:  Normal auditory acuity. Nose:  No deformity, discharge, or lesions. Mouth:  No deformity or lesions,oropharynx pink & moist. Neck:  Supple; no masses or thyromegaly. Lungs:  Respirations even and unlabored.  Clear throughout to auscultation.   No wheezes, crackles, or rhonchi. No acute distress. Heart:  Regular rate and rhythm; no murmurs, clicks, rubs, or gallops. Abdomen:  Normal bowel sounds.  No bruits.  Soft, non-tender and non-distended without masses, hepatosplenomegaly or hernias noted.  No guarding or rebound tenderness.    Msk:  Symmetrical without gross deformities. Good, equal movement & strength bilaterally. Pulses:  Normal pulses noted. Extremities:  No clubbing or edema.  No cyanosis. Neurologic:  Alert and oriented x3;  grossly normal neurologically. Skin:  Intact without significant lesions or rashes. No jaundice. Lymph Nodes:  No significant cervical adenopathy. Psych:  Alert and cooperative. Normal mood and affect.   Labs: CBC    Component Value Date/Time   WBC 7.1 05/24/2017 1537   RBC 4.47 05/24/2017 1537   HGB 13.3 05/24/2017 1537   HGB 8.2 (L) 12/02/2012 0451   HCT 40.2 05/24/2017 1537   HCT 23.1 (L) 12/02/2012 0451   PLT 272.0 05/24/2017 1537   PLT 196 12/02/2012 0451   MCV 90.0 05/24/2017 1537   MCV 91 12/02/2012 0451   MCH 31.4 06/20/2015 1118   MCHC 33.1 05/24/2017 1537   RDW 12.7 05/24/2017 1537   RDW 12.3 12/02/2012 0451   LYMPHSABS 2.5 05/24/2017 1537   LYMPHSABS 1.9 12/02/2012 0451   MONOABS 0.6 05/24/2017 1537   MONOABS 0.9 12/02/2012 0451   EOSABS 0.2 05/24/2017 1537   EOSABS  0.5 12/02/2012 0451   BASOSABS 0.1 05/24/2017 1537   BASOSABS 0.0 12/02/2012 0451   CMP     Component Value Date/Time   NA 139 07/07/2017 0945   NA 141 11/30/2012 0545   K 3.2 (L) 07/07/2017 0945   K 4.1 11/30/2012 0545   CL 99 07/07/2017 0945   CL 108 (H) 11/30/2012 0545   CO2 27 07/07/2017 0945   CO2 26 11/30/2012 0545   GLUCOSE 108 (H)  07/07/2017 0945   GLUCOSE 191 (H) 11/30/2012 0545   BUN 13 07/07/2017 0945   BUN 22 (H) 11/30/2012 0545   CREATININE 0.94 07/07/2017 0945   CREATININE 1.10 11/30/2012 0545   CALCIUM 9.1 07/07/2017 0945   CALCIUM 7.9 (L) 11/30/2012 0545   PROT 6.5 07/07/2017 0945   PROT 6.0 (L) 04/22/2012 0701   ALBUMIN 3.6 07/07/2017 0945   ALBUMIN 1.9 (L) 04/22/2012 0701   AST 22 07/07/2017 0945   AST 11 (L) 04/22/2012 0701   ALT 24 07/07/2017 0945   ALT 13 04/22/2012 0701   ALKPHOS 110 07/07/2017 0945   ALKPHOS 129 04/22/2012 0701   BILITOT 0.6 07/07/2017 0945   BILITOT 0.4 04/22/2012 0701   GFRNONAA 51 (L) 06/20/2015 1118   GFRNONAA 53 (L) 11/30/2012 0545   GFRAA 59 (L) 06/20/2015 1118   GFRAA >60 11/30/2012 0545    Imaging Studies: Dg Ugi W/small Bowel  Result Date: 07/19/2017 CLINICAL DATA:  Unintentional weight loss.  Recurrent nausea. EXAM: UPPER GI SERIES WITH SMALL BOWEL FOLLOW-THROUGH FLUOROSCOPY TIME:  Fluoroscopy Time:  0.9 minute Radiation Exposure Index (if provided by the fluoroscopic device): 18.8 mGy Number of Acquired Spot Images: 0 TECHNIQUE: Combined double contrast and single contrast upper GI series using effervescent crystals, thick barium, and thin barium. Subsequently, serial images of the small bowel were obtained including spot views of the terminal ileum. COMPARISON:  None. FINDINGS: KUB: There is no bowel dilatation to suggest obstruction. There is no evidence of pneumoperitoneum, portal venous gas or pneumatosis. There are no pathologic calcifications along the expected course of the ureters. There is no acute osseous abnormality. There is lower lumbar spine spondylosis. There is a total left hip arthroplasty. UPPER GI SMALL BOWEL FOLLOW-THROUGH: Examination of the esophagus demonstrated normal esophageal motility. Normal esophageal morphology without evidence of esophagitis or ulceration. No esophageal stricture, diverticula, or mass lesion. No evidence of hiatal hernia.  There is no spontaneous or inducible gastroesophageal reflux. Examination of the stomach demonstrated normal rugal folds and areae gastricae. The gastric mucosa appeared unremarkable without evidence of ulceration, scarring, or mass lesion. Gastric motility and emptying was normal. Fluoroscopic examination of the duodenum demonstrates normal motility and morphology without evidence of ulceration or mass lesion. Medium density barium was periodically observed under fluoroscopy to travel from the stomach to the ascending colon (over a 1 hour 15 minutes time period). There is no evidence of small bowel stricture or obstruction. No large filling defects to suggest mass lesion. In addition, there is no evidence of tethering or definite inflammatory changes present within the small bowel. IMPRESSION: 1. Normal upper GI and small-bowel follow-through. Electronically Signed   By: Kathreen Devoid   On: 07/19/2017 10:20    Assessment and Plan:   JAKAILA NORMENT is a 72 y.o. y/o female has been referred for dysphagia and weight loss, and loss of appetite  Further investigation is indicated for patient's dysphagia and weight loss Upper GI series was normal EGD will allow evaluation for rings, strictures, narrowing, EOE, or  any underlying masses (I have discussed alternative options, risks & benefits,  which include, but are not limited to, bleeding, infection, perforation,respiratory complication & drug reaction.  The patient agrees with this plan & written consent will be obtained. )  Patient, encouraged to eat small bites, chew food well, and follow dysphagia diet until then Her PCP has also refer her to endocrinology due to abnormal thyroid tests.  I have asked her to make this appointment soon, and notify us and let us know when it is.  Abnormal thyroid function because her weight changes.  Her last blood work done by primary care provider showed mildly low potassium level.  Patient states she will call her  primary care doctor to discuss need for replacement of potassium rich foods.  I have encouraged her to do so as well.  She is not having any diarrhea to attribute to the decreased potassium, and is likely related to her decreased p.o. Intake.  For patient's constipation, patient is unlikely to be able to tolerate a high-fiber diet.  She states she does not like eating and has loss of appetite.  She does like drinking juice and does not have any dysphasia with that.  I have asked her to start taking MiraLAX or Metamucil over the next 2 goal of soft bowel movements every day or every other day.  I have asked her to take the Metamucil or MiraLAX every day or every other day to allow for a regular regimen.  I have asked her to sit up it slowly due to her dysphasia.  If she has problems with that, she is stop it and call us.  Hepatic steatosis changes were suggested on her CT abdomen June 03, 2017.  It was reported as "suspect mild fatty infiltration of the liver" no focal lesions were reported.  No clinical evidence of cirrhosis liver enzymes are normal.  Primary care doctor has completed hep B and hep C testing which showed immunity to hepatitis B.  Was negative.  We will continue to follow.  Patient has already lost weight due to her decreased p.o. intake.  Dr Vonda Antigua

## 2017-08-02 NOTE — Telephone Encounter (Signed)
Neglected to treat her low potassium  When she had her labs done earlier in the month.  Sent potassium supplement to he pharmacy to take twice daily with food for 5 days.  MyChart message sent , make sure she gets it

## 2017-08-03 NOTE — Telephone Encounter (Signed)
Patient has read the mychart message that Dr. Derrel Nip sent her.

## 2017-08-04 DIAGNOSIS — M48061 Spinal stenosis, lumbar region without neurogenic claudication: Secondary | ICD-10-CM | POA: Diagnosis not present

## 2017-08-04 DIAGNOSIS — M48062 Spinal stenosis, lumbar region with neurogenic claudication: Secondary | ICD-10-CM | POA: Diagnosis not present

## 2017-08-06 ENCOUNTER — Encounter: Payer: Self-pay | Admitting: Internal Medicine

## 2017-08-11 NOTE — Telephone Encounter (Signed)
refilled on 07/26/2017 with one refill.

## 2017-08-16 DIAGNOSIS — R29898 Other symptoms and signs involving the musculoskeletal system: Secondary | ICD-10-CM | POA: Diagnosis not present

## 2017-08-16 DIAGNOSIS — M4155 Other secondary scoliosis, thoracolumbar region: Secondary | ICD-10-CM | POA: Diagnosis not present

## 2017-08-16 DIAGNOSIS — M4726 Other spondylosis with radiculopathy, lumbar region: Secondary | ICD-10-CM | POA: Diagnosis not present

## 2017-08-16 DIAGNOSIS — Z9889 Other specified postprocedural states: Secondary | ICD-10-CM | POA: Diagnosis not present

## 2017-08-16 DIAGNOSIS — M5136 Other intervertebral disc degeneration, lumbar region: Secondary | ICD-10-CM | POA: Diagnosis not present

## 2017-08-16 DIAGNOSIS — M48062 Spinal stenosis, lumbar region with neurogenic claudication: Secondary | ICD-10-CM | POA: Diagnosis not present

## 2017-08-16 DIAGNOSIS — M7138 Other bursal cyst, other site: Secondary | ICD-10-CM | POA: Diagnosis not present

## 2017-08-25 ENCOUNTER — Other Ambulatory Visit: Payer: Self-pay | Admitting: Internal Medicine

## 2017-08-26 ENCOUNTER — Telehealth: Payer: Self-pay | Admitting: *Deleted

## 2017-08-26 ENCOUNTER — Encounter: Payer: Self-pay | Admitting: Neurology

## 2017-08-26 ENCOUNTER — Ambulatory Visit: Payer: Medicare HMO | Admitting: Neurology

## 2017-08-26 ENCOUNTER — Ambulatory Visit
Admission: RE | Admit: 2017-08-26 | Discharge: 2017-08-26 | Disposition: A | Discharge status: Home / Self Care | Payer: Medicare HMO | Source: Ambulatory Visit | Attending: Neurology | Admitting: Neurology

## 2017-08-26 VITALS — BP 106/58 | HR 81 | Ht 64.5 in | Wt 149.5 lb

## 2017-08-26 DIAGNOSIS — R531 Weakness: Secondary | ICD-10-CM

## 2017-08-26 DIAGNOSIS — R799 Abnormal finding of blood chemistry, unspecified: Secondary | ICD-10-CM | POA: Diagnosis not present

## 2017-08-26 DIAGNOSIS — M25551 Pain in right hip: Secondary | ICD-10-CM

## 2017-08-26 DIAGNOSIS — M1611 Unilateral primary osteoarthritis, right hip: Secondary | ICD-10-CM | POA: Diagnosis not present

## 2017-08-26 DIAGNOSIS — R52 Pain, unspecified: Secondary | ICD-10-CM | POA: Diagnosis not present

## 2017-08-26 DIAGNOSIS — E538 Deficiency of other specified B group vitamins: Secondary | ICD-10-CM | POA: Diagnosis not present

## 2017-08-26 NOTE — Progress Notes (Signed)
PATIENT: Maria Hinton DOB: [birth year]  Chief Complaint  Patient presents with  . Lower extremity weakness    Reports right leg weakness that is worse with standing.  Symptoms started, without warning, while walking around the grocery store in December 2018.  History of three prior back surgeries.  Says Dr. Sherwood Gambler felt she needed to be evaluated for nerve damage.  . Neurosurgeon    Jovita Gamma, MD - referring MD  . PCP    Crecencio Mc, MD     HISTORICAL  Maria Hinton is a 72 year old female, seen in refer by neurosurgeon Dr. Jovita Gamma, for evaluation of lower extremity weakness, her primary care physician is Dr., Derrel Nip, [facility] Everts, initial evaluation was on 05/26/2018.  Reviewed and summarized the referring note, she had history of hypertension, hyperlipidemia, diabetes, insulin-dependent, left hip replacement, multiple lumbar decompression surgery by Dr. Sherwood Gambler in the past, in 1990, 2013, in 2016 for right-sided radicular pain,  In November 2018, without clear triggers, she noticed worsening right-sided low back pain, radiating pain to her right leg, also radiating pain along her right groin area, sometimes happen without her bearing weight, could have catching her right hip region, her right leg felt like Jell-O sometimes  She denies bowel and bladder incontinence, has bilateral feet paresthesia, bilateral hands paresthesia recently.  chronic neck pain,  During her visit with Dr. Sherwood Gambler on August 16, 2017, x-ray and MRI lumbar spine showed large synovial cyst on the left side at L3-4, right lateral recess, and foraminal narrowing at L4-5, she was also noted to have mild bilateral hip flexion weakness, she was referred for neurological evaluation to rule out neuromuscular priori to potential lumbar decompression fusion surgery. MRI of lumbar spine at L3-4, 8-9 mm T2 hyperintensity lesionsof demyelination has been left lateral recess, favored to be a degenerative  synovial cyst arising from the left facet, severe left neuroforaminal, and the left lateral recess stenosis, contributing to mild multifactorial spinal stenosis at this level, increased to multifactorial moderate to severe right L4 neural foraminal stenosis at L 3-4, L4-5,  Laboratory evaluations in January 2019 showed significant decrease TSH less than 0.01, free T4 was elevated 4.15, she is referred to endocrinologist for evaluations,  Laboratory evaluation was essentially normal or negative hepatitis B, hepatitis C antibody protein electrophoresis, C-reactive protein, CPK, HIV,  Lipid profile showed triglyceride 233,  REVIEW OF SYSTEMS: Full 14 system review of systems performed and notable only for as above  ALLERGIES: Allergies  Allergen Reactions  . Vicodin [Hydrocodone-Acetaminophen] Other (See Comments)    Insomnia     HOME MEDICATIONS: Current Outpatient Medications  Medication Sig Dispense Refill  . albuterol (PROVENTIL HFA;VENTOLIN HFA) 108 (90 BASE) MCG/ACT inhaler Inhale 2 puffs into the lungs every 6 (six) hours as needed for wheezing. 6.7 g 11  . ALPRAZolam (XANAX) 0.25 MG tablet TAKE 1 TABLET BY MOUTH TWICE A DAY AS NEEDED 45 tablet 1  . aspirin EC 81 MG tablet Take 81 mg by mouth daily.    Marland Kitchen atenolol (TENORMIN) 50 MG tablet TAKE 1 TABLET (50 MG TOTAL) BY MOUTH 2 (TWO) TIMES DAILY. 180 tablet 3  . atorvastatin (LIPITOR) 40 MG tablet TAKE 1 TABLET BY MOUTH DAILY AT 6 PM. 90 tablet 3  . BD PEN NEEDLE NANO U/F 32G X 4 MM MISC USE DAILY AS DIRECTED 100 each 1  . Blood Glucose Monitoring Suppl (ONE TOUCH ULTRA SYSTEM KIT) W/DEVICE KIT Pt needs OneTouch Ultra Blue glucometer dx 250.02  1 each 0  . glipiZIDE (GLUCOTROL) 10 MG tablet TAKE 1 TABLET BY MOUTH 2 TIMES DAILY BEFORE A MEAL. 180 tablet 3  . glucose blood (ONE TOUCH ULTRA TEST) test strip Use as instructed bid, has OneTouch Ultra Blue  dx: 250.02 100 each 3  . hydrochlorothiazide (MICROZIDE) 12.5 MG capsule Take 12.5 mg  by mouth daily.    . Insulin Detemir (LEVEMIR FLEXPEN) 100 UNIT/ML Pen Inject 20 Units into the skin daily at 10 pm. 15 mL 11  . Lancets (ONETOUCH ULTRASOFT) lancets Use as instructed bid dx 250.02 100 each 3  . lisinopril (PRINIVIL,ZESTRIL) 10 MG tablet TAKE 1 TABLET BY MOUTH DAILY 90 tablet 1  . metFORMIN (GLUCOPHAGE) 1000 MG tablet TAKE 1 TABLET BY MOUTH 2 TIMES DAILY WITH A MEAL. 180 tablet 3  . omeprazole (PRILOSEC) 40 MG capsule Take 1 capsule (40 mg total) by mouth daily. 90 capsule 0  . traMADol (ULTRAM) 50 MG tablet Take 1 tablet (50 mg total) by mouth every 6 (six) hours as needed. FOR SPINAL STENOSIS PAIN 120 tablet 5   No current facility-administered medications for this visit.     PAST MEDICAL HISTORY: Past Medical History:  Diagnosis Date  . Actinic keratoses    follows with dermatology   . Arthritis   . Asthma   . Chronic back pain    DDD,herniated disc/spondylosis/radiculopathy  . COPD (chronic obstructive pulmonary disease) ([place])   . Diabetes mellitus   . Early cataracts, bilateral    Dr. Ruthine Dose - [facility]  . GERD (gastroesophageal reflux disease)    takes Omeprazole daily  . Hemorrhoids   . History of colonic polyps    colonoscopy 04/2012 - Dr Candace Cruise  . Hyperlipidemia    takes Pravastatin daily  . Hypertension    takes Metoprolol/HCTZ daily  . Insomnia   . Joint pain    fingers  . Joint swelling   . Pneumonia    x 2 ;in Dec 2010/2011  . PONV (postoperative nausea and vomiting)   . Right leg weakness     PAST SURGICAL HISTORY: Past Surgical History:  Procedure Laterality Date  . ABDOMINAL HYSTERECTOMY     at age 29  . BACK SURGERY  20+yrs ago  . COLONOSCOPY    . COLONOSCOPY N/A 05/13/2015   Procedure: COLONOSCOPY;  Surgeon: Hulen Luster, MD;  Location: [facility];  Service: Gastroenterology;  Laterality: N/A;  . ESOPHAGOGASTRODUODENOSCOPY  2011  . FOOT SURGERY  2011   bunionectomy and knot removed from bottom of both feet  . JOINT  REPLACEMENT Left 2014   hip  Kraskinski  . LUMBAR LAMINECTOMY/DECOMPRESSION MICRODISCECTOMY  09/10/2011   Procedure: LUMBAR LAMINECTOMY/DECOMPRESSION MICRODISCECTOMY 1 LEVEL;  Surgeon: Hosie Spangle, MD;  Location: [place] NEURO ORS;  Service: Neurosurgery;  Laterality: Right;  RIGHT Lumbar  Laminotomy and microdiskectomy Lumbar Four-Five  . LUMBAR LAMINECTOMY/DECOMPRESSION MICRODISCECTOMY N/A 06/26/2015   Procedure: LUMBAR LAMINECTOMY/DECOMPRESSION MICRODISCECTOMY 1 LEVEL;  Surgeon: Jovita Gamma, MD;  Location: [place] NEURO ORS;  Service: Neurosurgery;  Laterality: N/A;  L3 and L4 Laminectomies  . MELANOMA EXCISION  2011   on nose/back on neck  . THORACIC DISC SURGERY  at age 39 and 60    mass of veins that had gotten into muscle and wrapped around rib;3 ribs also removed from left side  . TONSILLECTOMY  as a child   and adenoids   . TOTAL HIP ARTHROPLASTY Left 2014   Dr. Mack Guise  . TUBAL LIGATION    .  VAGINA SURGERY     vaginal wall ruptured     FAMILY HISTORY: Family History  Problem Relation Age of Onset  . Heart disease Brother        valve replacement  . COPD Brother   . COPD Mother        Emphysema  . Diabetes Mother   . Other Mother        Died in house fire at age 84.  Marland Kitchen Heart disease Father   . Seizures Father   . Cancer Father        Lung cancer with brain metastasis  . Asthma Brother   . COPD Son 82  . Cancer Other        aunt ? maternal vs paternal side lung cancer   . Diabetes Maternal Grandmother   . Anesthesia problems Neg Hx   . Hypotension Neg Hx   . Malignant hyperthermia Neg Hx   . Pseudochol deficiency Neg Hx     SOCIAL HISTORY:  Social History   Socioeconomic History  . Marital status: Widowed    Spouse name: Not on file  . Number of children: 2  . Years of education: 37  . Highest education level: High school graduate  Social Needs  . Financial resource strain: Not on file  . Food insecurity - worry: Not on file  . Food insecurity -  inability: Not on file  . Transportation needs - medical: Not on file  . Transportation needs - non-medical: Not on file  Occupational History  . Occupation: Retired  Tobacco Use  . Smoking status: Former Smoker    Packs/day: 0.25    Years: 20.00    Pack years: 5.00    Types: Cigarettes    Last attempt to quit: 2018    Years since quitting: 1.1  . Smokeless tobacco: Never Used  . Tobacco comment: Pt quit 11/08/2014.Pt had quit smoking for 13 years before starting back in 2010; per 05/2017 visit quit smoking 2017 smoked total 25 years 1 ppd   Substance and Sexual Activity  . Alcohol use: No    Alcohol/week: 0.0 oz  . Drug use: No  . Sexual activity: No    Birth control/protection: Surgical  Other Topics Concern  . Not on file  Social History Narrative   Widowed.    Lives alone.   Right-handed.    2 cups caffeine per day.     PHYSICAL EXAM   Vitals:   08/26/17 1304  BP: (!) 106/58  Pulse: 81  Weight: 149 lb 8 oz (67.8 kg)  Height: 5' 4.5" (1.638 m)    Not recorded      Body mass index is 25.27 kg/m.  PHYSICAL EXAMNIATION:  Gen: NAD, conversant, well nourised, obese, well groomed                     Cardiovascular: Regular rate rhythm, no peripheral edema, warm, nontender. Eyes: Conjunctivae clear without exudates or hemorrhage Neck: Supple, no carotid bruits. Pulmonary: Clear to auscultation bilaterally   NEUROLOGICAL EXAM:  MENTAL STATUS: Speech:    Speech is normal; fluent and spontaneous with normal comprehension.  Cognition:     Orientation to time, place and person     Normal recent and remote memory     Normal Attention span and concentration     Normal Language, naming, repeating,spontaneous speech     Fund of knowledge   CRANIAL NERVES: CN II: Visual fields are full to confrontation. Fundoscopic  exam is normal with sharp discs and no vascular changes. Pupils are round equal and briskly reactive to light. CN III, IV, VI: extraocular movement are  normal. No ptosis. CN V: Facial sensation is intact to pinprick in all 3 divisions bilaterally. Corneal responses are intact.  CN VII: Face is symmetric with normal eye closure and smile. CN VIII: Hearing is normal to rubbing fingers CN IX, X: Palate elevates symmetrically. Phonation is normal. CN XI: Head turning and shoulder shrug are intact CN XII: Tongue is midline with normal movements and no atrophy.  MOTOR: Bilateral lower extremity pitting edema, normal muscle tone, bulk, she has mild bilateral shoulder abduction, external rotation, elbow flexion, tension weakness, no significant hand muscle weakness, she also has mild to moderate right ankle dorsiflexion, toe extension weakness, mild bilateral hip flexion weakness  REFLEXES: Reflexes are hypoactive and symmetric, left side has Babinski sign,  SENSORY: Less dependent decreased light touch, vibratory sensation, pinprick at bilateral lower extremities, right worse than left to knee level  COORDINATION: Rapid alternating movements and fine finger movements are intact. There is no dysmetria on finger-to-nose and heel-knee-shin.    GAIT/STANCE: She needs pushed up to get up from seated position, mildly antalgic, could not stand up on the right heel   DIAGNOSTIC DATA (LABS, IMAGING, TESTING) - I reviewed patient records, labs, notes, testing and imaging myself where available.   ASSESSMENT AND PLAN  Maria Hinton is a 72 y.o. female   Right lumbosacral radiculopathy Worsening bilateral upper and lower extremity paresthesia, weakness, right worse than left, Recent diagnosis of hypothyroidism,   TSH was less than 0.01, with elevated free T4 4.45 Need to rule out superimposed cervical spondylitic myelopathy, right hip pathology EMG nerve conduction study MRI of the cervical spine Laboratory evaluations   Marcial Pacas, M.D. Ph.D.  [facility] [address], [place], [place] [address] Ph: [phone] Fax: [phone]  CC: Jovita Gamma, MD, Crecencio Mc, MD

## 2017-08-26 NOTE — Telephone Encounter (Signed)
-----   Message from Marcial Pacas, MD sent at 08/26/2017  3:29 PM EST ----- Please call patient for degenerative changes of right hip,there was no acute abnormality.

## 2017-08-26 NOTE — Telephone Encounter (Signed)
Spoke to patient she is aware of results

## 2017-08-27 LAB — ACETYLCHOLINE RECEPTOR, BINDING: AChR Binding Ab, Serum: <0.03 nmol/L (ref 0.00–0.24)

## 2017-08-27 LAB — CK: Total CK: 47 U/L (ref 24–173)

## 2017-08-27 LAB — THYROID PANEL WITH TSH
Free Thyroxine Index: 7.2 — ABNORMAL HIGH (ref 1.2–4.9)
T3 Uptake Ratio: 47 % — ABNORMAL HIGH (ref 24–39)
T4, Total: 15.4 ug/dL — ABNORMAL HIGH (ref 4.5–12.0)
TSH: <0.006 u[IU]/mL — ABNORMAL LOW (ref 0.450–4.500)

## 2017-08-27 LAB — VITAMIN B12: Vitamin B-12: 394 pg/mL (ref 232–1245)

## 2017-09-01 ENCOUNTER — Other Ambulatory Visit: Payer: Self-pay

## 2017-09-01 DIAGNOSIS — R1310 Dysphagia, unspecified: Secondary | ICD-10-CM

## 2017-09-02 ENCOUNTER — Encounter
Admission: RE | Disposition: A | Discharge status: Home / Self Care | Payer: Self-pay | Source: Ambulatory Visit | Attending: Gastroenterology

## 2017-09-02 ENCOUNTER — Encounter: Payer: Self-pay | Admitting: Anesthesiology

## 2017-09-02 ENCOUNTER — Ambulatory Visit: Payer: Medicare HMO | Admitting: Certified Registered"

## 2017-09-02 ENCOUNTER — Ambulatory Visit
Admission: RE | Admit: 2017-09-02 | Discharge: 2017-09-02 | Disposition: A | Discharge status: Home / Self Care | Payer: Medicare HMO | Source: Ambulatory Visit | Attending: Gastroenterology | Admitting: Gastroenterology

## 2017-09-02 DIAGNOSIS — M549 Dorsalgia, unspecified: Secondary | ICD-10-CM | POA: Insufficient documentation

## 2017-09-02 DIAGNOSIS — Z87891 Personal history of nicotine dependence: Secondary | ICD-10-CM | POA: Insufficient documentation

## 2017-09-02 DIAGNOSIS — K209 Esophagitis, unspecified without bleeding: Secondary | ICD-10-CM

## 2017-09-02 DIAGNOSIS — Z96642 Presence of left artificial hip joint: Secondary | ICD-10-CM | POA: Diagnosis not present

## 2017-09-02 DIAGNOSIS — K200 Eosinophilic esophagitis: Secondary | ICD-10-CM | POA: Diagnosis not present

## 2017-09-02 DIAGNOSIS — Z79891 Long term (current) use of opiate analgesic: Secondary | ICD-10-CM | POA: Insufficient documentation

## 2017-09-02 DIAGNOSIS — K449 Diaphragmatic hernia without obstruction or gangrene: Secondary | ICD-10-CM | POA: Diagnosis not present

## 2017-09-02 DIAGNOSIS — E119 Type 2 diabetes mellitus without complications: Secondary | ICD-10-CM | POA: Diagnosis not present

## 2017-09-02 DIAGNOSIS — G8929 Other chronic pain: Secondary | ICD-10-CM | POA: Insufficient documentation

## 2017-09-02 DIAGNOSIS — J449 Chronic obstructive pulmonary disease, unspecified: Secondary | ICD-10-CM | POA: Insufficient documentation

## 2017-09-02 DIAGNOSIS — I10 Essential (primary) hypertension: Secondary | ICD-10-CM | POA: Insufficient documentation

## 2017-09-02 DIAGNOSIS — L538 Other specified erythematous conditions: Secondary | ICD-10-CM | POA: Diagnosis not present

## 2017-09-02 DIAGNOSIS — R1310 Dysphagia, unspecified: Secondary | ICD-10-CM | POA: Diagnosis not present

## 2017-09-02 DIAGNOSIS — K2950 Unspecified chronic gastritis without bleeding: Secondary | ICD-10-CM | POA: Insufficient documentation

## 2017-09-02 DIAGNOSIS — K210 Gastro-esophageal reflux disease with esophagitis: Secondary | ICD-10-CM | POA: Diagnosis not present

## 2017-09-02 DIAGNOSIS — K2289 Other specified disease of esophagus: Secondary | ICD-10-CM

## 2017-09-02 DIAGNOSIS — Z794 Long term (current) use of insulin: Secondary | ICD-10-CM | POA: Diagnosis not present

## 2017-09-02 DIAGNOSIS — K317 Polyp of stomach and duodenum: Secondary | ICD-10-CM | POA: Diagnosis not present

## 2017-09-02 DIAGNOSIS — I739 Peripheral vascular disease, unspecified: Secondary | ICD-10-CM | POA: Diagnosis not present

## 2017-09-02 DIAGNOSIS — K2960 Other gastritis without bleeding: Secondary | ICD-10-CM | POA: Diagnosis not present

## 2017-09-02 DIAGNOSIS — F419 Anxiety disorder, unspecified: Secondary | ICD-10-CM | POA: Diagnosis not present

## 2017-09-02 DIAGNOSIS — Z79899 Other long term (current) drug therapy: Secondary | ICD-10-CM | POA: Insufficient documentation

## 2017-09-02 DIAGNOSIS — Z7982 Long term (current) use of aspirin: Secondary | ICD-10-CM | POA: Insufficient documentation

## 2017-09-02 DIAGNOSIS — Z85820 Personal history of malignant melanoma of skin: Secondary | ICD-10-CM | POA: Diagnosis not present

## 2017-09-02 DIAGNOSIS — K228 Other specified diseases of esophagus: Secondary | ICD-10-CM

## 2017-09-02 DIAGNOSIS — E785 Hyperlipidemia, unspecified: Secondary | ICD-10-CM | POA: Insufficient documentation

## 2017-09-02 DIAGNOSIS — K3189 Other diseases of stomach and duodenum: Secondary | ICD-10-CM

## 2017-09-02 HISTORY — PX: ESOPHAGOGASTRODUODENOSCOPY (EGD) WITH PROPOFOL: SHX5813

## 2017-09-02 LAB — GLUCOSE, CAPILLARY: Glucose-Capillary: 90 mg/dL (ref 65–99)

## 2017-09-02 SURGERY — ESOPHAGOGASTRODUODENOSCOPY (EGD) WITH PROPOFOL
Anesthesia: General

## 2017-09-02 MED ORDER — LIDOCAINE HCL (CARDIAC) 20 MG/ML IV SOLN
INTRAVENOUS | Status: DC | PRN
Start: 2017-09-02 — End: 2017-09-02
  Administered 2017-09-02: 50 mg via INTRATRACHEAL

## 2017-09-02 MED ORDER — PROPOFOL 500 MG/50ML IV EMUL
INTRAVENOUS | Status: DC | PRN
  Administered 2017-09-02: 160 ug/kg/min via INTRAVENOUS

## 2017-09-02 MED ORDER — PROPOFOL 10 MG/ML IV BOLUS
INTRAVENOUS | Status: DC | PRN
  Administered 2017-09-02: 10 mg via INTRAVENOUS
  Administered 2017-09-02: 20 mg via INTRAVENOUS
  Administered 2017-09-02: 40 mg via INTRAVENOUS
  Administered 2017-09-02: 20 mg via INTRAVENOUS

## 2017-09-02 MED ORDER — SODIUM CHLORIDE 0.9 % IV SOLN
INTRAVENOUS | Status: DC
  Administered 2017-09-02: 14:00:00 via INTRAVENOUS

## 2017-09-02 NOTE — Anesthesia Procedure Notes (Signed)
Date/Time: 09/02/2017 2:50 PM Performed by: Johnna Acosta, CRNA Pre-anesthesia Checklist: Patient identified, Emergency Drugs available, Suction available, Patient being monitored and Timeout performed Patient Re-evaluated:Patient Re-evaluated prior to induction Oxygen Delivery Method: Nasal cannula Preoxygenation: Pre-oxygenation with 100% oxygen

## 2017-09-02 NOTE — Transfer of Care (Signed)
Immediate Anesthesia Transfer of Care Note  Patient: Maria Hinton  Procedure(s) Performed: ESOPHAGOGASTRODUODENOSCOPY (EGD) WITH PROPOFOL (N/A )  Patient Location: PACU  Anesthesia Type:General  Level of Consciousness: sedated  Airway & Oxygen Therapy: Patient Spontanous Breathing and Patient connected to nasal cannula oxygen  Post-op Assessment: Report given to RN and Post -op Vital signs reviewed and stable  Post vital signs: Reviewed and stable  Last Vitals:  Vitals:   09/02/17 1521 09/02/17 1522  BP: (!) 107/39 (!) 107/39  Pulse: 95 95  Resp: (!) 25 (!) 24  Temp: (!) 36.1 C 36.6 C  SpO2: 100% 98%    Last Pain:  Vitals:   09/02/17 1522  TempSrc: Temporal  PainSc:          Complications: No apparent anesthesia complications

## 2017-09-02 NOTE — Anesthesia Postprocedure Evaluation (Signed)
Anesthesia Post Note  Patient: Maria Hinton  Procedure(s) Performed: ESOPHAGOGASTRODUODENOSCOPY (EGD) WITH PROPOFOL (N/A )  Patient location during evaluation: PACU Anesthesia Type: General Level of consciousness: awake and alert and oriented Pain management: pain level controlled Vital Signs Assessment: post-procedure vital signs reviewed and stable Respiratory status: spontaneous breathing Cardiovascular status: blood pressure returned to baseline Anesthetic complications: no     Last Vitals:  Vitals:   09/02/17 1532 09/02/17 1542  BP: (!) 109/50 (!) 129/51  Pulse: 89 87  Resp: 19 (!) 21  Temp:    SpO2: 100% 100%    Last Pain:  Vitals:   09/02/17 1522  TempSrc: Temporal  PainSc:                  ,

## 2017-09-02 NOTE — Anesthesia Post-op Follow-up Note (Signed)
Anesthesia QCDR form completed.        

## 2017-09-02 NOTE — Anesthesia Preprocedure Evaluation (Signed)
Anesthesia Evaluation  Patient identified by MRN, date of birth, ID band Patient awake    Reviewed: Allergy & Precautions, H&P , NPO status , Patient's Chart, lab work & pertinent test results, reviewed documented beta blocker date and time   History of Anesthesia Complications (+) PONV and history of anesthetic complications  Airway Mallampati: II  TM Distance: >3 FB Neck ROM: full    Dental no notable dental hx. (+) Teeth Intact   Pulmonary shortness of breath and with exertion, asthma , neg sleep apnea, neg pneumonia , COPD,  COPD inhaler, neg recent URI, former smoker,    Pulmonary exam normal breath sounds clear to auscultation       Cardiovascular Exercise Tolerance: Good hypertension, On Medications and On Home Beta Blockers (-) angina+ Peripheral Vascular Disease  (-) CAD, (-) Past MI, (-) Cardiac Stents and (-) CABG Normal cardiovascular exam(-) dysrhythmias (-) Valvular Problems/Murmurs Rhythm:regular Rate:Normal     Neuro/Psych  Headaches, PSYCHIATRIC DISORDERS (depression) Anxiety  Neuromuscular disease negative neurological ROS     GI/Hepatic Neg liver ROS, GERD  Medicated and Controlled,  Endo/Other  diabetes, Well Controlled, Oral Hypoglycemic Agents  Renal/GU negative Renal ROS  negative genitourinary   Musculoskeletal  (+) Arthritis , Osteoarthritis,    Abdominal   Peds negative pediatric ROS (+)  Hematology negative hematology ROS (+)   Anesthesia Other Findings Past Medical History:   PONV (postoperative nausea and vomiting)                     Hypertension                                                   Comment:takes Metoprolol/HCTZ daily   Hyperlipidemia                                                 Comment:takes Pravastatin daily   Asthma                                                       Pneumonia                                                      Comment:x 2 ;in Dec 2010/2011  Arthritis                                                    Joint swelling                                               Joint pain  Comment:fingers   Chronic back pain                                              Comment:DDD,herniated disc/spondylosis/radiculopathy   GERD (gastroesophageal reflux disease)                         Comment:takes Omeprazole daily   Hemorrhoids                                                  History of colonic polyps                                      Comment:colonoscopy 04/2012 - Dr Candace Cruise   Diabetes mellitus                                            Early cataracts, bilateral                                     Comment:Dr. Rebech - [facility]   Depression                                                     Comment:after death of husband;takes Ativan prn   COPD (chronic obstructive pulmonary disease) (*              Reproductive/Obstetrics negative OB ROS                             Anesthesia Physical  Anesthesia Plan  ASA: III  Anesthesia Plan: General   Post-op Pain Management:    Induction:   PONV Risk Score and Plan:   Airway Management Planned: Nasal Cannula  Additional Equipment:   Intra-op Plan:   Post-operative Plan:   Informed Consent: I have reviewed the patients History and Physical, chart, labs and discussed the procedure including the risks, benefits and alternatives for the proposed anesthesia with the patient or authorized representative who has indicated his/her understanding and acceptance.   Dental Advisory Given  Plan Discussed with: Anesthesiologist, CRNA and Surgeon  Anesthesia Plan Comments:         Anesthesia Quick Evaluation

## 2017-09-02 NOTE — H&P (Signed)
Maria Antigua, MD [address], [place], [place], [place], [address] [address], [place], [place], [place], [address] Phone: [phone]  Fax: [phone]  Primary Care Physician:  Crecencio Mc, MD   Pre-Procedure History & Physical: HPI:  Maria Hinton is a 72 y.o. female is here for an EGD.   Past Medical History:  Diagnosis Date  . Actinic keratoses    follows with dermatology   . Arthritis   . Asthma   . Chronic back pain    DDD,herniated disc/spondylosis/radiculopathy  . COPD (chronic obstructive pulmonary disease) ([place])   . Diabetes mellitus   . Early cataracts, bilateral    Dr. Ruthine Dose - [facility]  . GERD (gastroesophageal reflux disease)    takes Omeprazole daily  . Hemorrhoids   . History of colonic polyps    colonoscopy 04/2012 - Dr Candace Cruise  . Hyperlipidemia    takes Pravastatin daily  . Hypertension    takes Metoprolol/HCTZ daily  . Insomnia   . Joint pain    fingers  . Joint swelling   . Pneumonia    x 2 ;in Dec 2010/2011  . PONV (postoperative nausea and vomiting)   . Right leg weakness     Past Surgical History:  Procedure Laterality Date  . ABDOMINAL HYSTERECTOMY     at age 43  . BACK SURGERY  20+yrs ago  . COLONOSCOPY    . COLONOSCOPY N/A 05/13/2015   Procedure: COLONOSCOPY;  Surgeon: Hulen Luster, MD;  Location: [facility] ENDOSCOPY;  Service: Gastroenterology;  Laterality: N/A;  . ESOPHAGOGASTRODUODENOSCOPY  2011  . FOOT SURGERY  2011   bunionectomy and knot removed from bottom of both feet  . JOINT REPLACEMENT Left 2014   hip  Kraskinski  . LUMBAR LAMINECTOMY/DECOMPRESSION MICRODISCECTOMY  09/10/2011   Procedure: LUMBAR LAMINECTOMY/DECOMPRESSION MICRODISCECTOMY 1 LEVEL;  Surgeon: Hosie Spangle, MD;  Location: [facility]URO ORS;  Service: Neurosurgery;  Laterality: Right;  RIGHT Lumbar  Laminotomy and microdiskectomy Lumbar Four-Five  . LUMBAR LAMINECTOMY/DECOMPRESSION MICRODISCECTOMY N/A 06/26/2015   Procedure: LUMBAR  LAMINECTOMY/DECOMPRESSION MICRODISCECTOMY 1 LEVEL;  Surgeon: Jovita Gamma, MD;  Location: [facility]URO ORS;  Service: Neurosurgery;  Laterality: N/A;  L3 and L4 Laminectomies  . MELANOMA EXCISION  2011   on nose/back on neck  . THORACIC DISC SURGERY  at age 39 and 30    mass of veins that had gotten into muscle and wrapped around rib;3 ribs also removed from left side  . TONSILLECTOMY  as a child   and adenoids   . TOTAL HIP ARTHROPLASTY Left 2014   Dr. Mack Guise  . TUBAL LIGATION    . VAGINA SURGERY     vaginal wall ruptured     Prior to Admission medications   Medication Sig Start Date End Date Taking? Authorizing Provider  aspirin EC 81 MG tablet Take 81 mg by mouth daily.   Yes [provider]  atenolol (TENORMIN) 50 MG tablet TAKE 1 TABLET (50 MG TOTAL) BY MOUTH 2 (TWO) TIMES DAILY. 06/05/16  Yes Crecencio Mc, MD  atorvastatin (LIPITOR) 40 MG tablet TAKE 1 TABLET BY MOUTH DAILY AT 6 PM. 06/05/16  Yes Crecencio Mc, MD  glipiZIDE (GLUCOTROL) 10 MG tablet TAKE 1 TABLET BY MOUTH 2 TIMES DAILY BEFORE A MEAL. 06/05/16  Yes Crecencio Mc, MD  hydrochlorothiazide (MICROZIDE) 12.5 MG capsule Take 12.5 mg by mouth daily.   Yes [provider]  Insulin Detemir (LEVEMIR FLEXPEN) 100 UNIT/ML Pen Inject 20 Units  into the skin daily at 10 pm. 12/28/16  Yes Crecencio Mc, MD  lisinopril (PRINIVIL,ZESTRIL) 10 MG tablet TAKE 1 TABLET BY MOUTH DAILY 08/25/17  Yes Crecencio Mc, MD  metFORMIN (GLUCOPHAGE) 1000 MG tablet TAKE 1 TABLET BY MOUTH 2 TIMES DAILY WITH A MEAL. 06/05/16  Yes Crecencio Mc, MD  omeprazole (PRILOSEC) 40 MG capsule Take 1 capsule (40 mg total) by mouth daily. 07/09/17  Yes Crecencio Mc, MD  traMADol (ULTRAM) 50 MG tablet Take 1 tablet (50 mg total) by mouth every 6 (six) hours as needed. FOR SPINAL STENOSIS PAIN 07/09/17  Yes Crecencio Mc, MD  albuterol (PROVENTIL HFA;VENTOLIN HFA) 108 (90 BASE) MCG/ACT inhaler Inhale 2 puffs into the lungs every 6 (six)  hours as needed for wheezing. Patient not taking: Reported on 09/02/2017 12/18/14   Vilinda Boehringer, MD  ALPRAZolam Duanne Moron) 0.25 MG tablet TAKE 1 TABLET BY MOUTH TWICE A DAY AS NEEDED Patient not taking: Reported on 09/02/2017 07/26/17   Crecencio Mc, MD  BD PEN NEEDLE NANO U/F 32G X 4 MM MISC USE DAILY AS DIRECTED 03/15/17   Crecencio Mc, MD  Blood Glucose Monitoring Suppl (ONE TOUCH ULTRA SYSTEM KIT) W/DEVICE KIT Pt needs OneTouch Ultra Blue glucometer dx 250.02 05/26/13   Rey, Latina Craver, NP  glucose blood (ONE TOUCH ULTRA TEST) test strip Use as instructed bid, has OneTouch Ultra Blue  dx: 250.02 05/26/13   Rey, Latina Craver, NP  Lancets (ONETOUCH ULTRASOFT) lancets Use as instructed bid dx 250.02 05/26/13   Sherryl Barters, NP    Allergies as of 09/01/2017 - Review Complete 08/26/2017  Allergen Reaction Noted  . Vicodin [hydrocodone-acetaminophen] Other (See Comments) 09/08/2011    Family History  Problem Relation Age of Onset  . Heart disease Brother        valve replacement  . COPD Brother   . COPD Mother        Emphysema  . Diabetes Mother   . Other Mother        Died in house fire at age 13.  Marland Kitchen Heart disease Father   . Seizures Father   . Cancer Father        Lung cancer with brain metastasis  . Asthma Brother   . COPD Son 37  . Cancer Other        aunt ? maternal vs paternal side lung cancer   . Diabetes Maternal Grandmother   . Anesthesia problems Neg Hx   . Hypotension Neg Hx   . Malignant hyperthermia Neg Hx   . Pseudochol deficiency Neg Hx     Social History   Socioeconomic History  . Marital status: Widowed    Spouse name: Not on file  . Number of children: 2  . Years of education: 60  . Highest education level: High school graduate  Social Needs  . Financial resource strain: Not on file  . Food insecurity - worry: Not on file  . Food insecurity - inability: Not on file  . Transportation needs - medical: Not on file  . Transportation needs - non-medical:  Not on file  Occupational History  . Occupation: Retired  Tobacco Use  . Smoking status: Former Smoker    Packs/day: 0.25    Years: 20.00    Pack years: 5.00    Types: Cigarettes    Last attempt to quit: 2018    Years since quitting: 1.1  . Smokeless tobacco: Never Used  . Tobacco comment:  Pt quit 11/08/2014.Pt had quit smoking for 13 years before starting back in 2010; per 05/2017 visit quit smoking 2017 smoked total 25 years 1 ppd   Substance and Sexual Activity  . Alcohol use: No    Alcohol/week: 0.0 oz  . Drug use: No  . Sexual activity: No    Birth control/protection: Surgical  Other Topics Concern  . Not on file  Social History Narrative   Widowed.    Lives alone.   Right-handed.    2 cups caffeine per day.    Review of Systems: See HPI, otherwise negative ROS  Physical Exam: BP (!) 138/52   Pulse 92   Temp (!) 97.5 F (36.4 C) (Tympanic)   Resp 16   Ht _0  (1.626 m)   Wt 149 lb (67.6 kg)   SpO2 99%   BMI 25.58 kg/m  General:   Alert,  pleasant and cooperative in NAD Head:  Normocephalic and atraumatic. Neck:  Supple; no masses or thyromegaly. Lungs:  Clear throughout to auscultation, normal respiratory effort.    Heart:  +S1, +S2, Regular rate and rhythm, No edema. Abdomen:  Soft, nontender and nondistended. Normal bowel sounds, without guarding, and without rebound.   Neurologic:  Alert and  oriented x4;  grossly normal neurologically.  Impression/Plan: Maria Hinton is here for an EGD for Acid Reflux and dysphagia.  Risks, benefits, limitations, and alternatives regarding the procedure have been reviewed with the patient.  Questions have been answered.  All parties agreeable.   Virgel Manifold, MD  09/02/2017, 2:44 PM

## 2017-09-02 NOTE — Op Note (Signed)
[facility] Gastroenterology Patient Name: Maria Hinton Procedure Date: 09/02/2017 2:45 PM MRN: [id number] Account #: [id number] Date of Birth: 1946/04/27 Admit Type: Outpatient Age: 72 Room: [facility] ENDO ROOM 3 Gender: Female Note Status: Finalized Procedure:            Upper GI endoscopy Indications:          Dysphagia Providers:             B. Bonna Gains MD, MD Referring MD:         Deborra Medina, MD (Referring MD) Medicines:            Monitored Anesthesia Care Complications:        No immediate complications. Procedure:            Pre-Anesthesia Assessment:                       - The risks and benefits of the procedure and the                        sedation options and risks were discussed with the                        patient. All questions were answered and informed                        consent was obtained.                       - Patient identification and proposed procedure were                        verified prior to the procedure.                       - ASA Grade Assessment: III - A patient with severe                        systemic disease.                       After obtaining informed consent, the endoscope was                        passed under direct vision. Throughout the procedure,                        the patient's blood pressure, pulse, and oxygen                        saturations were monitored continuously. The Endoscope                        was introduced through the mouth, and advanced to the                        second part of duodenum. The upper GI endoscopy was                        accomplished with ease. The patient tolerated the  procedure well. Findings:      Mucosal changes including feline appearance, longitudinal furrows and       white plaques were found in the entire esophagus. Biopsies were obtained       from the proximal and distal esophagus with cold forceps for histology   of suspected eosinophilic esophagitis.      The Z-line was found 39 cm from the incisors.      [place] of salmon-colored mucosa were present at 39 cm. Biopsies       were taken with a cold forceps for histology.      Patchy mildly erythematous mucosa without bleeding was found in the       gastric antrum. Biopsies were obtained in the gastric body, at the       incisura and in the gastric antrum with cold forceps for histology.      A few 2 to 4 mm sessile polyps with no bleeding and no stigmata of       recent bleeding were found in the gastric body. One of The polyp was       removed with a cold biopsy forceps. Resection and retrieval were       complete.      A small hiatal hernia was present.      The examined duodenum was normal. Impression:           - Esophageal mucosal changes suggestive of eosinophilic                        esophagitis. Biopsied.                       - Z-line, 39 cm from the incisors.                       - Salmon-colored mucosa suspicious for Barrett's                        esophagus. Biopsied.                       - Erythematous mucosa in the antrum.                       - A few gastric polyps. Likely benign fundic gland                        polyps. The largest one was removed, Resected and                        retrieved.                       - Small hiatal hernia.                       - Normal examined duodenum.                       - Biopsies were obtained in the gastric body, at the                        incisura and in the gastric antrum. Recommendation:       - Await pathology results.                       -  Follow an antireflux regimen.                       - Continue present medications.                       - Return to my office as previously scheduled.                       - Return to primary care physician as previously                        scheduled.                       - The findings and recommendations were discussed  with                        the patient.                       - The findings and recommendations were discussed with                        the patient's family. Procedure Code(s):    --- Professional ---                       [phone], Esophagogastroduodenoscopy, flexible, transoral;                        with biopsy, single or multiple Diagnosis Code(s):    --- Professional ---                       K20.9, Esophagitis, unspecified                       K22.8, Other specified diseases of esophagus                       K31.89, Other diseases of stomach and duodenum                       K31.7, Polyp of stomach and duodenum                       K44.9, Diaphragmatic hernia without obstruction or                        gangrene                       R13.10, Dysphagia, unspecified CPT copyright 2016 American Medical Association. All rights reserved. The codes documented in this report are preliminary and upon coder review may  be revised to meet current compliance requirements.  Vonda Antigua, MD Margretta Sidle B. Bonna Gains MD, MD 09/02/2017 3:22:22 PM This report has been signed electronically. Number of Addenda: 0 Note Initiated On: 09/02/2017 2:45 PM      [facility]

## 2017-09-03 ENCOUNTER — Encounter: Payer: Self-pay | Admitting: Gastroenterology

## 2017-09-05 ENCOUNTER — Ambulatory Visit
Admission: RE | Admit: 2017-09-05 | Discharge: 2017-09-05 | Disposition: A | Discharge status: Home / Self Care | Payer: Medicare HMO | Source: Ambulatory Visit | Attending: Neurology | Admitting: Neurology

## 2017-09-05 ENCOUNTER — Other Ambulatory Visit: Payer: Self-pay | Admitting: Internal Medicine

## 2017-09-05 DIAGNOSIS — R531 Weakness: Secondary | ICD-10-CM

## 2017-09-06 LAB — SURGICAL PATHOLOGY

## 2017-09-07 ENCOUNTER — Ambulatory Visit: Payer: Medicare HMO | Admitting: Endocrinology

## 2017-09-07 ENCOUNTER — Encounter: Payer: Self-pay | Admitting: Endocrinology

## 2017-09-07 VITALS — BP 140/68 | HR 88 | Wt 148.8 lb

## 2017-09-07 DIAGNOSIS — E0590 Thyrotoxicosis, unspecified without thyrotoxic crisis or storm: Secondary | ICD-10-CM

## 2017-09-07 MED ORDER — METHIMAZOLE 10 MG PO TABS: 10.0000 mg | ORAL_TABLET | Freq: Two times a day (BID) | ORAL | 1 refills | Status: DC

## 2017-09-07 NOTE — Patient Instructions (Signed)
What is hyperthyroidism?  Hyperthyroidism develops when the body is exposed to excessive amounts of thyroid hormone. This disorder occurs in almost one percent of all Americans and affects women five to 10 times more often than men. In its mildest form, hyperthyroidism may not cause recognizable symptoms. More often, however, the symptoms are discomforting, disabling or even life-threatening.  What are the causes of hyperthyroidism?  Berenice Primas' disease: Graves' disease (named after [place] physician Raylene Everts) is an autoimmune disorder that frequently results in thyroid enlargement and hyperthyroidism. In some patients, swelling of the muscles and other tissues around the eyes may develop, causing eye prominence, discomfort or double vision. Like other autoimmune diseases, this condition tends to affect multiple family members. It is much more common in women than in men and tends to occur in younger patients. . ften not before a temporary period of low thyroid hormone production (hypothyroidism) occurs. . Toxic multinodular goiter: Multiple nodules in the thyroid can produce excessive thyroid hormone, causing hyperthyroidism. Typically diagnosed in patients over the age of 39, this disorder is more likely to affect heart rhythm. In many cases, the person has had the goiter for many years before it becomes overactive. . Toxic nodule: A single nodule or lump in the thyroid can also produce more thyroid hormone than the body requires and lead to hyperthyroidism. This disorder is not familial. . Excessive iodine ingestion: Various sources of high iodine concentrations, such as kelp tablets, some expectorants, amiodarone (Cordarone, Pacerone - medications used to treat certain problems with heart rhythms) and x-ray dyes may occasionally cause hyperthyroidism in patients who are prone to it. . Overmedication with thyroid hormone: Patients who receive excessive thyroxine replacement treatment can develop  hyperthyroidism. They should have their thyroid hormone dosage evaluated by a physician at least once each year and should NEVER give themselves "extra" doses.   What are the signs and symptoms of hyperthyroidism? When hyperthyroidism develops, a goiter (enlargement of the thyroid) is usually present and may be associated with some or many of the following features: . Fast heart rate, often more than 100 beats per minute . Becoming anxious, irritable, argumentative . Trembling hands . Weight loss, despite eating the same amount or even more than usual . Intolerance of warm temperatures and increased likelihood to perspire . Loss of scalp hair . Tendency of fingernails to separate from the nail bed . Muscle weakness, especially of the upper arms and thighs . Loose and frequent bowel movements . Smooth skin . Change in menstrual pattern . Increased likelihood for miscarriage . Prominent "stare" of the eyes . Protrusion of the eyes, with or without double vision (in patients with Graves' disease) . Irregular heart rhythm, especially in patients older than 70 years of age . Accelerated loss of calcium from bones, which increases the risk of osteoporosis and fractures   How is hyperthyroidism diagnosed? Sometimes a general physician can diagnose and treat the cause of hyperthyroidism, but assistance is often needed from an endocrinologist, a physician who specializes in managing thyroid disease. Characteristic symptoms and physical signs of the disease can be detected by a trained physician. In addition, tests can be used to confirm the diagnosis and to determine the cause.  Tests TSH (THYROID-STIMULATING HORMONE OR THYROTROPIN): A low TSH level in the blood is the most accurate indicator of hyperthyroidism. The body shuts off production of this pituitary hormone when the thyroid gland even slightly overproduces thyroid hormone. If the TSH level is low, it is very important to  also check  thyroid hormone levels to confirm the diagnosis of hyperthyroidism.  ESTIMATES OF FREE THYROXINE AND FREE TRIIODOTHYRONINE: When hyperthyroidism develops, free thyroxine and free triiodothyronine levels rise above previous values in that specific patient (although they may still fall within the normal range for the general population) and are often considerably elevated. TSI (THYROID-STIMULATING IMMUNOGLOBULIN): A substance often found in the blood when Graves' disease is the cause of hyperthyroidism. RADIOACTIVE IODINE UPTAKE (RAIU): The amount of iodine the thyroid gland can collect, and a thyroid scan, which shows how the iodine is distributed throughout the thyroid gland.  THYROID SCAN: This information can be useful in determining the cause of hyperthyroidism and, ultimately, its treatment.   How is hyperthyroidism treated? Appropriate management of hyperthyroidism requires careful evaluation and ongoing care by a physician experienced in the treatment of this complex condition. Before the development of current treatment options, the death rate from severe hyperthyroidism was as high as 50 percent. Now several effective treatments are available and, with proper management, death from hyperthyroidism is rare. Deciding which treatment is best depends on what caused the hyperthyroidism, its severity and other conditions present.   . Antithyroid Drugs In the [place], two drugs are available for treating hyperthyroidism: propylthiouracil (PTU) and methimazole (Tapazole). Except for early pregnancy, methimazole is preferred because PTU can cause fatal liver damage, although rarely. These medications control hyperthyroidism by slowing thyroid hormone production. They may take several months to normalize thyroid hormone levels. Some patients with hyperthyroidism caused by Graves' disease experience a spontaneous or natural remission of hyperthyroidism after a 12- to 48-month course of treatment with  these drugs and may sometimes avoid permanent underactivity of the thyroid (hypothyroidism), which often occurs as a result of using the other methods of treating hyperthyroidism. Unfortunately, the remission is frequently only temporary, with the hyperthyroidism recurring after several months or years off medication and requiring additional treatment, so relatively few patients are treated solely with antithyroid medication in the [place]. Antithyroid drugs may cause an allergic reaction in about five percent of patients who use them. This usually occurs during the first six weeks of drug treatment. Such a reaction may include rash or hives; but after discontinuing use of the drug, the symptoms resolve within one to two weeks and there is no permanent damage. A more serious side effect, but occurring in only about one in 250-500 patients during the first four to eight weeks of treatment, is a rapid decrease of white blood cells in the bloodstream. This could increase susceptibility to serious infection. Symptoms such as a sore throat, infection or fever should be reported promptly to your physician, and a white blood cell count should be done immediately. In nearly every case, when a person stops using the medication, the white blood cell count returns to normal. Very rarely, antithyroid drugs may cause severe liver problems, which can be detected by monitoring blood tests or joint problems characterized by joint pain and/or swelling. Your physician should be contacted if there is yellowing of the skin (jaundice), fever, loss of appetite or abdominal pain.  . Radioactive Iodine Treatment Iodine is an essential ingredient in the production of thyroid hormone. Each molecule of thyroid hormone contains either four (T4) or three (T3) molecules of iodine. Since most overactive thyroid glands are quite hungry for iodine, it was discovered in the 1940s that the thyroid could be "tricked" into destroying itself  by simply feeding it radioactive iodine. The radioactive iodine is given by mouth, usually  in capsule form, and is quickly absorbed from the bowel. It then enters the thyroid cells from the bloodstream and gradually destroys them. Maximal benefit is usually noted within three to six months. It is not possible to eliminate "just the right amount" of the diseased thyroid gland, since radioiodine eventually damages all thyroid cells. Therefore, most endocrinologists strive to completely destroy the diseased thyroid gland with a single dose of radioiodine. This results in the intentional development of an underactive thyroid state (hypothyroidism), which is easily, predictably and inexpensively corrected by lifelong daily use of oral thyroid hormone replacement therapy. Although every effort is made to calculate the correct dose of radioiodine for each patient, not every treatment will successfully correct the hyperthyroidism, particularly if the goiter is quite large and a second dose of radioactive iodine is occasionally needed. Thousands of patients have received radioiodine treatment, including former Software engineer of the [employer] and his wife, Pamala Hurry. The treatment is a very safe, simple and reliably effective one. Because of this, it is considered by most thyroid specialists in the [place] to be the treatment of choice for hyperthyroidism cases caused by overproduction of thyroid hormone. . . Other Treatments A drug from the class of beta-adrenergic blocking agents (which decrease the effects of excess thyroid hormone) may be used temporarily to control hyperthyroid symptoms until other therapies take effect. In cases where hyperthyroidism is caused by thyroiditis or excessive ingestion of either iodine or thyroid hormone, this may be the only type of treatment required. Iodine drops are prescribed when hyperthyroidism is severe or prior to undergoing surgery for Graves' disease.

## 2017-09-07 NOTE — Progress Notes (Signed)
Patient ID: Maria Hinton, female   DOB: Dec 07, 1945, 72 y.o.   MRN: [id number]                                                                                                              Reason for Appointment:  Hyperthyroidism, new consultation  Referring physician: Derrel Nip   Chief complaint: Weakness   History of Present Illness:   For the last several months patient has had symptoms of weakness, palpitations, shakiness, feeling sweaty in am, nervousness, and fatigue. Although she did have some palpitations last year she has less recently although she feels a strong heartbeat in her ears  She has had a decreased appetite and started losing weight late last summer The patient has lost about  26 lbs since these symptoms started Currently also has continued to have tiredness and fatigue Although she did not feel excessively warm she does not get cold like she used to  She had been recommended endocrinology consultation when her thyroid levels were high in November but she may have canceled her appointment and did not  reschedule until now She continues to take atenolol but she has been taking for several years for hypertension  Wt Readings from Last 3 Encounters:  09/07/17 148 lb 12.8 oz (67.5 kg)  09/02/17 149 lb (67.6 kg)  08/26/17 149 lb 8 oz (67.8 kg)     Treatments so far: None  Thyroid function tests as follows:     Lab Results  Component Value Date   FREET4 4.15 (H) 05/24/2017   TSH <0.006 (L) 08/26/2017   TSH <0.01 Repeated and verified X2. (L) 05/24/2017   TSH 1.31 11/28/2013    No results found for: THYROTRECAB   Allergies as of 09/07/2017      Reactions   Vicodin [hydrocodone-acetaminophen] Other (See Comments)   Insomnia       Medication List        Accurate as of 09/07/17  1:50 PM. Always use your most recent med list.          albuterol 108 (90 Base) MCG/ACT inhaler Commonly known as:  PROVENTIL HFA;VENTOLIN HFA Inhale 2 puffs into the lungs every  6 (six) hours as needed for wheezing.   ALPRAZolam 0.25 MG tablet Commonly known as:  XANAX TAKE 1 TABLET BY MOUTH TWICE A DAY AS NEEDED   aspirin EC 81 MG tablet Take 81 mg by mouth daily.   atenolol 50 MG tablet Commonly known as:  TENORMIN TAKE 1 TABLET (50 MG TOTAL) BY MOUTH 2 (TWO) TIMES DAILY.   atorvastatin 40 MG tablet Commonly known as:  LIPITOR TAKE 1 TABLET BY MOUTH DAILY AT 6 PM.   BD PEN NEEDLE NANO U/F 32G X 4 MM Misc Generic drug:  Insulin Pen Needle USE DAILY AS DIRECTED   glipiZIDE 10 MG tablet Commonly known as:  GLUCOTROL TAKE 1 TABLET BY MOUTH 2 TIMES DAILY BEFORE A MEAL.   glucose blood test strip Commonly known as:  ONE TOUCH ULTRA TEST Use as instructed bid,  has OneTouch Ultra Blue  dx: 250.02   hydrochlorothiazide 12.5 MG capsule Commonly known as:  MICROZIDE Take 12.5 mg by mouth daily.   Insulin Detemir 100 UNIT/ML Pen Commonly known as:  LEVEMIR FLEXPEN Inject 20 Units into the skin daily at 10 pm.   LEVEMIR FLEXTOUCH 100 UNIT/ML Pen Generic drug:  Insulin Detemir INJECT 15 UNITS INTO THE SKIN DAILY AT 10 PM.   lisinopril 10 MG tablet Commonly known as:  PRINIVIL,ZESTRIL TAKE 1 TABLET BY MOUTH DAILY   metFORMIN 1000 MG tablet Commonly known as:  GLUCOPHAGE TAKE 1 TABLET BY MOUTH 2 TIMES DAILY WITH A MEAL.   omeprazole 40 MG capsule Commonly known as:  PRILOSEC Take 1 capsule (40 mg total) by mouth daily.   ONE TOUCH ULTRA SYSTEM KIT w/Device Kit Pt needs OneTouch Ultra Blue glucometer dx 250.02   onetouch ultrasoft lancets Use as instructed bid dx 250.02   traMADol 50 MG tablet Commonly known as:  ULTRAM Take 1 tablet (50 mg total) by mouth every 6 (six) hours as needed. FOR SPINAL STENOSIS PAIN           Past Medical History:  Diagnosis Date  . Actinic keratoses    follows with dermatology   . Arthritis   . Asthma   . Chronic back pain    DDD,herniated disc/spondylosis/radiculopathy  . COPD (chronic obstructive  pulmonary disease) ([place])   . Diabetes mellitus   . Early cataracts, bilateral    Dr. Ruthine Dose - [facility]  . GERD (gastroesophageal reflux disease)    takes Omeprazole daily  . Hemorrhoids   . History of colonic polyps    colonoscopy 04/2012 - Dr Candace Cruise  . Hyperlipidemia    takes Pravastatin daily  . Hypertension    takes Metoprolol/HCTZ daily  . Insomnia   . Joint pain    fingers  . Joint swelling   . Pneumonia    x 2 ;in Dec 2010/2011  . PONV (postoperative nausea and vomiting)   . Right leg weakness     Past Surgical History:  Procedure Laterality Date  . ABDOMINAL HYSTERECTOMY     at age 52  . BACK SURGERY  20+yrs ago  . COLONOSCOPY    . COLONOSCOPY N/A 05/13/2015   Procedure: COLONOSCOPY;  Surgeon: Hulen Luster, MD;  [facility]: [facility] ENDOSCOPY;  Service: Gastroenterology;  Laterality: N/A;  . ESOPHAGOGASTRODUODENOSCOPY  2011  . ESOPHAGOGASTRODUODENOSCOPY (EGD) WITH PROPOFOL N/A 09/02/2017   Procedure: ESOPHAGOGASTRODUODENOSCOPY (EGD) WITH PROPOFOL;  Surgeon: Virgel Manifold, MD;  Location: [facility] ENDOSCOPY;  Service: Endoscopy;  Laterality: N/A;  . FOOT SURGERY  2011   bunionectomy and knot removed from bottom of both feet  . JOINT REPLACEMENT Left 2014   hip  Kraskinski  . LUMBAR LAMINECTOMY/DECOMPRESSION MICRODISCECTOMY  09/10/2011   Procedure: LUMBAR LAMINECTOMY/DECOMPRESSION MICRODISCECTOMY 1 LEVEL;  Surgeon: Hosie Spangle, MD;  Location: [place] NEURO ORS;  Service: Neurosurgery;  Laterality: Right;  RIGHT Lumbar  Laminotomy and microdiskectomy Lumbar Four-Five  . LUMBAR LAMINECTOMY/DECOMPRESSION MICRODISCECTOMY N/A 06/26/2015   Procedure: LUMBAR LAMINECTOMY/DECOMPRESSION MICRODISCECTOMY 1 LEVEL;  Surgeon: Jovita Gamma, MD;  Location: [place] NEURO ORS;  Service: Neurosurgery;  Laterality: N/A;  L3 and L4 Laminectomies  . MELANOMA EXCISION  2011   on nose/back on neck  . THORACIC DISC SURGERY  at age 66 and 52    mass of veins that had gotten into muscle and  wrapped around rib;3 ribs also removed from left side  . TONSILLECTOMY  as a child  and adenoids   . TOTAL HIP ARTHROPLASTY Left 2014   Dr. Mack Guise  . TUBAL LIGATION    . VAGINA SURGERY     vaginal wall ruptured     Family History  Problem Relation Age of Onset  . Heart disease Brother        valve replacement  . COPD Brother   . COPD Mother        Emphysema  . Diabetes Mother   . Other Mother        Died in house fire at age 32.  Marland Kitchen Heart disease Father   . Seizures Father   . Cancer Father        Lung cancer with brain metastasis  . Asthma Brother   . COPD Son 33  . Cancer Other        aunt ? maternal vs paternal side lung cancer   . Diabetes Maternal Grandmother   . Anesthesia problems Neg Hx   . Hypotension Neg Hx   . Malignant hyperthermia Neg Hx   . Pseudochol deficiency Neg Hx     Social History:  reports that she quit smoking about 14 months ago. Her smoking use included cigarettes. She has a 5.00 pack-year smoking history. she has never used smokeless tobacco. She reports that she does not drink alcohol or use drugs.  Allergies:  Allergies  Allergen Reactions  . Vicodin [Hydrocodone-Acetaminophen] Other (See Comments)    Insomnia      Review of Systems  Constitutional: Positive for weight loss and reduced appetite.  HENT:       She feels a lump in her throat in the middle but no difficulty swallowing food  Respiratory: Positive for shortness of breath.   Cardiovascular: Positive for leg swelling.       Rt  Gastrointestinal: Positive for constipation.  Endocrine: Positive for fatigue and general weakness. Negative for light-headedness.  Musculoskeletal: Positive for joint pain and back pain.       Has chronic back pain requiring multiple surgeries and also has had some finger joint pains  Skin: Negative for rash.  Neurological: Positive for weakness and tremors.       Has more weakness on her she has generalized weakness but more so on the right  leg, reportedly has had some disc problems also       Examination:   BP 140/68 (BP Location: Left Arm, Patient Position: Sitting, Cuff Size: Normal)   Pulse 88   Wt 148 lb 12.8 oz (67.5 kg)   SpO2 98%   BMI 25.54 kg/m    General Appearance:  well-built and nourished, pleasant, not anxious or hyperkinetic.         Eyes: No abnormal prominence, lid lag or stare present.  No swelling of the eyelids   Neck: The thyroid is not enlarged  There is no lymphadenopathy in the neck .           Heart: normal S1 and S2, no murmurs .          Lungs: breath sounds are normal bilaterally without added sounds  Abdomen: no hepatosplenomegaly or other palpable abnormality  Extremities: hands are warm.  She has 2+ right and 1+ left lower leg edema.  Neurological:  Bilateral fine tremors are present. Deep tendon reflexes at biceps are brisk.  Skin: No rash, abnormal thickening of the skin on the lower legs seen     Assessment/Plan:   Hyperthyroidism, Likely to be from Graves' disease  She has been symptomatic for several months and had significant hypothyroidism on her labs in November but is not on treatment yet She is mostly symptomatic with weakness and weight loss along with shakiness  Since she does not have an obvious goiter most likely she does have Graves' disease Will do thyrotropin receptor antibody to confirm   Discussed with the patient the hyperthyroidism is a result of an autoimmune condition involving the thyroid.  Explained that the options for treatment are basically antithyroid drugs and radioactive iodine.  Discussed the pros and cons for each treatment: Antithyroid drugs would be reasonable for mild disease but would need frequent followup with lab monitoring as well as potential for side effects from the medications and uncertainty about long-term cure of the problem Discussed that I-131 treatment is safe and simple to do but will result in long-term hypothyroidism  that will result from ablation of the thyroid tissue and the need for lifelong supplementation and periodic monitoring.  Clinically would be appropriate for the patient to be treated with antithyroid drugs at this time with methimazole elevated lab work  Since she has significant symptoms and elevation of free T4 she will start with 10 mg twice daily on methimazole and follow-up in 4 weeks  Patient handout on hyperthyroidism and radioactive iodine treatment given Patient understands the above discussion and treatment options. All questions were answered satisfactorily  Consult note sent to referring physician  Elayne Snare 09/07/2017, 1:50 PM    Note: This office note was prepared with Dragon voice recognition system technology. Any transcriptional errors that result from this process are unintentional.

## 2017-09-08 LAB — THYROTROPIN RECEPTOR AUTOABS: Thyrotropin Receptor Ab: 3.76 IU/L — ABNORMAL HIGH (ref 0.00–1.75)

## 2017-09-11 ENCOUNTER — Other Ambulatory Visit: Payer: Self-pay | Admitting: Internal Medicine

## 2017-09-22 ENCOUNTER — Ambulatory Visit (INDEPENDENT_AMBULATORY_CARE_PROVIDER_SITE_OTHER): Payer: Medicare HMO | Admitting: Neurology

## 2017-09-22 ENCOUNTER — Ambulatory Visit: Payer: Medicare HMO | Admitting: Neurology

## 2017-09-22 DIAGNOSIS — R531 Weakness: Secondary | ICD-10-CM

## 2017-09-22 DIAGNOSIS — M25551 Pain in right hip: Secondary | ICD-10-CM

## 2017-09-22 DIAGNOSIS — M5416 Radiculopathy, lumbar region: Secondary | ICD-10-CM

## 2017-09-22 NOTE — Progress Notes (Signed)
PATIENT: Maria Hinton DOB: July 13, 1945  No chief complaint on file.    HISTORICAL  Maria Hinton is a 72 year old female, seen in refer by neurosurgeon Dr. Jovita Gamma, for evaluation of lower extremity weakness, her primary care physician is Dr., Derrel Nip, Ar[facility] Everts, initial evaluation was on 05/26/2018.  Reviewed and summarized the referring note, she had history of hypertension, hyperlipidemia, diabetes, insulin-dependent, left hip replacement, multiple lumbar decompression surgery by Dr. Sherwood Gambler in the past, in 1990, 2013, in 2016 for right-sided radicular pain,  In November 2018, without clear triggers, she noticed worsening right-sided low back pain, radiating pain to her right leg, also radiating pain along her right groin area, sometimes happen without her bearing weight, could have catching her right hip region, her right leg felt like Jell-O sometimes  She denies bowel and bladder incontinence, has bilateral feet paresthesia, bilateral hands paresthesia recently.  chronic neck pain,  During her visit with Dr. Sherwood Gambler on August 16, 2017, x-ray and MRI lumbar spine showed large synovial cyst on the left side at L3-4, right lateral recess, and foraminal narrowing at L4-5, she was also noted to have mild bilateral hip flexion weakness, she was referred for neurological evaluation to rule out neuromuscular priori to potential lumbar decompression fusion surgery. MRI of lumbar spine at L3-4, 8-9 mm T2 hyperintensity cystic lesion at left lateral recess, favored to be a degenerative synovial cyst arising from the left facet, severe left neuroforaminal, and the left lateral recess stenosis, contributing to mild multifactorial spinal stenosis at this level, increased to multifactorial moderate to severe right L4 neural foraminal stenosis at L 3-4, L4-5,  Laboratory evaluations in January 2019 showed significant decrease TSH less than 0.01, free T4 was elevated 4.15, she is referred  to endocrinologist for evaluations,  Laboratory evaluation was essentially normal or negative hepatitis B, hepatitis C antibody protein electrophoresis, C-reactive protein, CPK, HIV,  Lipid profile showed triglyceride 233,  Update September 22, 2017: She return for electrodiagnostic study today, which showed evidence of chronic right lumbosacral radiculopathy mainly involving right L4-5 myotomes, she was noted to have mild right ankle weakness, dragging her right leg while walking, could not perform right heel walking, complains of gait abnormality, right leg give out on her, most consistent with her history of chronic right lumbar radiculopathy, also correlate MRI lumbar findings  MRI of the cervical showed no significant abnormality,  X-ray of right hip showed mild degenerative changes, no acute abnormality  Laboratory evaluations showed normal negative B12, CPK, acetylcholine receptor binding antibody, evidence of significantly decreased TSH, with an elevated T4 T3, A1c was elevated 7.4,  She was seen by endocrinologist Dr. Dwyane Dee, hyperthyroidism likely from [facility]' disease, she was started on methimazole 10 mg twice a day    REVIEW OF SYSTEMS: Full 14 system review of systems performed and notable only for as above  ALLERGIES: Allergies  Allergen Reactions  . Vicodin [Hydrocodone-Acetaminophen] Other (See Comments)    Insomnia     HOME MEDICATIONS: Current Outpatient Medications  Medication Sig Dispense Refill  . albuterol (PROVENTIL HFA;VENTOLIN HFA) 108 (90 BASE) MCG/ACT inhaler Inhale 2 puffs into the lungs every 6 (six) hours as needed for wheezing. 6.7 g 11  . ALPRAZolam (XANAX) 0.25 MG tablet TAKE 1 TABLET BY MOUTH TWICE A DAY AS NEEDED 45 tablet 1  . aspirin EC 81 MG tablet Take 81 mg by mouth daily.    Marland Kitchen atenolol (TENORMIN) 50 MG tablet TAKE 1 TABLET (50 MG TOTAL) BY MOUTH  2 (TWO) TIMES DAILY. 180 tablet 3  . atorvastatin (LIPITOR) 40 MG tablet TAKE 1 TABLET BY MOUTH DAILY  AT 6 PM. 90 tablet 3  . BD PEN NEEDLE NANO U/F 32G X 4 MM MISC USE DAILY AS DIRECTED 100 each 1  . Blood Glucose Monitoring Suppl (ONE TOUCH ULTRA SYSTEM KIT) W/DEVICE KIT Pt needs OneTouch Ultra Blue glucometer dx 250.02 1 each 0  . glipiZIDE (GLUCOTROL) 10 MG tablet TAKE 1 TABLET BY MOUTH 2 TIMES DAILY BEFORE A MEAL. 180 tablet 3  . glucose blood (ONE TOUCH ULTRA TEST) test strip Use as instructed bid, has OneTouch Ultra Blue  dx: 250.02 100 each 3  . hydrochlorothiazide (MICROZIDE) 12.5 MG capsule Take 12.5 mg by mouth daily.    . Insulin Detemir (LEVEMIR FLEXPEN) 100 UNIT/ML Pen Inject 20 Units into the skin daily at 10 pm. 15 mL 11  . Lancets (ONETOUCH ULTRASOFT) lancets Use as instructed bid dx 250.02 100 each 3  . LEVEMIR FLEXTOUCH 100 UNIT/ML Pen INJECT 15 UNITS INTO THE SKIN DAILY AT 10 PM. 15 pen 2  . lisinopril (PRINIVIL,ZESTRIL) 10 MG tablet TAKE 1 TABLET BY MOUTH DAILY 90 tablet 1  . metFORMIN (GLUCOPHAGE) 1000 MG tablet TAKE 1 TABLET BY MOUTH 2 TIMES DAILY WITH A MEAL. 180 tablet 3  . methimazole (TAPAZOLE) 10 MG tablet Take 1 tablet (10 mg total) by mouth 2 (two) times daily. 60 tablet 1  . omeprazole (PRILOSEC) 40 MG capsule Take 1 capsule (40 mg total) by mouth daily. 90 capsule 0  . traMADol (ULTRAM) 50 MG tablet Take 1 tablet (50 mg total) by mouth every 6 (six) hours as needed. FOR SPINAL STENOSIS PAIN 120 tablet 5   No current facility-administered medications for this visit.     PAST MEDICAL HISTORY: Past Medical History:  Diagnosis Date  . Actinic keratoses    follows with dermatology   . Arthritis   . Asthma   . Chronic back pain    DDD,herniated disc/spondylosis/radiculopathy  . COPD (chronic obstructive pulmonary disease) ([place])   . Diabetes mellitus    1980s  . Early cataracts, bilateral    Dr. Ruthine Dose - [facility]  . GERD (gastroesophageal reflux disease)    takes Omeprazole daily  . Hemorrhoids   . History of colonic polyps    colonoscopy  04/2012 - Dr Candace Cruise  . Hyperlipidemia    takes Pravastatin daily  . Hypertension    takes Metoprolol/HCTZ daily  . Insomnia   . Joint pain    fingers  . Joint swelling   . Pneumonia    x 2 ;in Dec 2010/2011  . PONV (postoperative nausea and vomiting)   . Right leg weakness     PAST SURGICAL HISTORY: Past Surgical History:  Procedure Laterality Date  . ABDOMINAL HYSTERECTOMY     at age 51  . BACK SURGERY  20+yrs ago  . COLONOSCOPY    . COLONOSCOPY N/A 05/13/2015   Procedure: COLONOSCOPY;  Surgeon: Hulen Luster, MD;  Location: [facility] ENDOSCOPY;  Service: Gastroenterology;  Laterality: N/A;  . ESOPHAGOGASTRODUODENOSCOPY  2011  . ESOPHAGOGASTRODUODENOSCOPY (EGD) WITH PROPOFOL N/A 09/02/2017   Procedure: ESOPHAGOGASTRODUODENOSCOPY (EGD) WITH PROPOFOL;  Surgeon: Virgel Manifold, MD;  Location: [facility] ENDOSCOPY;  Service: Endoscopy;  Laterality: N/A;  . FOOT SURGERY  2011   bunionectomy and knot removed from bottom of both feet  . JOINT REPLACEMENT Left 2014   hip  Kraskinski  . LUMBAR LAMINECTOMY/DECOMPRESSION MICRODISCECTOMY  09/10/2011  Procedure: LUMBAR LAMINECTOMY/DECOMPRESSION MICRODISCECTOMY 1 LEVEL;  Surgeon: Hosie Spangle, MD;  Location: [place] NEURO ORS;  Service: Neurosurgery;  Laterality: Right;  RIGHT Lumbar  Laminotomy and microdiskectomy Lumbar Four-Five  . LUMBAR LAMINECTOMY/DECOMPRESSION MICRODISCECTOMY N/A 06/26/2015   Procedure: LUMBAR LAMINECTOMY/DECOMPRESSION MICRODISCECTOMY 1 LEVEL;  Surgeon: Jovita Gamma, MD;  Location: [place] NEURO ORS;  Service: Neurosurgery;  Laterality: N/A;  L3 and L4 Laminectomies  . MELANOMA EXCISION  2011   on nose/back on neck  . THORACIC DISC SURGERY  at age 83 and 21    mass of veins that had gotten into muscle and wrapped around rib;3 ribs also removed from left side  . TONSILLECTOMY  as a child   and adenoids   . TOTAL HIP ARTHROPLASTY Left 2014   Dr. Mack Guise  . TUBAL LIGATION    . VAGINA SURGERY     vaginal wall ruptured      FAMILY HISTORY: Family History  Problem Relation Age of Onset  . Heart disease Brother        valve replacement  . COPD Brother   . COPD Mother        Emphysema  . Diabetes Mother   . Other Mother        Died in house fire at age 23.  Marland Kitchen Heart disease Father   . Seizures Father   . Cancer Father        Lung cancer with brain metastasis  . Asthma Brother   . COPD Son 74  . Cancer Other        aunt ? maternal vs paternal side lung cancer   . Diabetes Maternal Grandmother   . Anesthesia problems Neg Hx   . Hypotension Neg Hx   . Malignant hyperthermia Neg Hx   . Pseudochol deficiency Neg Hx   . Thyroid disease Neg Hx     SOCIAL HISTORY:  Social History   Socioeconomic History  . Marital status: Widowed    Spouse name: Not on file  . Number of children: 2  . Years of education: 22  . Highest education level: High school graduate  Social Needs  . Financial resource strain: Not on file  . Food insecurity - worry: Not on file  . Food insecurity - inability: Not on file  . Transportation needs - medical: Not on file  . Transportation needs - non-medical: Not on file  Occupational History  . Occupation: Retired  Tobacco Use  . Smoking status: Former Smoker    Packs/day: 0.25    Years: 20.00    Pack years: 5.00    Types: Cigarettes    Last attempt to quit: 2018    Years since quitting: 1.2  . Smokeless tobacco: Never Used  . Tobacco comment: Pt quit 11/08/2014.Pt had quit smoking for 13 years before starting back in 2010; per 05/2017 visit quit smoking 2017 smoked total 25 years 1 ppd   Substance and Sexual Activity  . Alcohol use: No    Alcohol/week: 0.0 oz  . Drug use: No  . Sexual activity: No    Birth control/protection: Surgical  Other Topics Concern  . Not on file  Social History Narrative   Widowed.    Lives alone.   Right-handed.    2 cups caffeine per day.     PHYSICAL EXAM   There were no vitals filed for this visit.  Not recorded       There is no height or weight on file to calculate BMI.  PHYSICAL EXAMNIATION:  Gen: NAD, conversant, well nourised, obese, well groomed                     Cardiovascular: Regular rate rhythm, no peripheral edema, warm, nontender. Eyes: Conjunctivae clear without exudates or hemorrhage Neck: Supple, no carotid bruits. Pulmonary: Clear to auscultation bilaterally   NEUROLOGICAL EXAM:  MENTAL STATUS: Speech:    Speech is normal; fluent and spontaneous with normal comprehension.  Cognition:     Orientation to time, place and person     Normal recent and remote memory     Normal Attention span and concentration     Normal Language, naming, repeating,spontaneous speech     Fund of knowledge   CRANIAL NERVES: CN II: Visual fields are full to confrontation. Fundoscopic exam is normal with sharp discs and no vascular changes. Pupils are round equal and briskly reactive to light. CN III, IV, VI: extraocular movement are normal. No ptosis. CN V: Facial sensation is intact to pinprick in all 3 divisions bilaterally. Corneal responses are intact.  CN VII: Face is symmetric with normal eye closure and smile. CN VIII: Hearing is normal to rubbing fingers CN IX, X: Palate elevates symmetrically. Phonation is normal. CN XI: Head turning and shoulder shrug are intact CN XII: Tongue is midline with normal movements and no atrophy.  MOTOR: She has mild to moderate right ankle dorsiflexion, toe extension weakness,  REFLEXES: Reflexes are hypoactive and symmetric, left side has Babinski sign,  SENSORY: Less dependent decreased light touch, vibratory sensation, pinprick at bilateral lower extremities, right worse than left to knee level  COORDINATION: Rapid alternating movements and fine finger movements are intact. There is no dysmetria on finger-to-nose and heel-knee-shin.    GAIT/STANCE: She needs pushed up to get up from seated position, mildly antalgic, could not stand up on the  right heel, mild right foot drop while ambulating   DIAGNOSTIC DATA (LABS, IMAGING, TESTING) - I reviewed patient records, labs, notes, testing and imaging myself where available.   ASSESSMENT AND PLAN  Maria Hinton is a 72 y.o. female   Right lumbosacral radiculopathy Recent diagnosis of hyperthyroidism   TSH was less than 0.01, with elevated free T4, T3, is on methimazole treatment now,  Electrodiagnostic study confirmed a diagnosis of chronic right lumbosacral radiculopathy, she was noted to have mild right ankle dorsiflexion weakness, this consistent with abnormal MRI lumbar findings, and her complains of right leg weakness,  She is to continue follow-up with neurosurgeon Dr. Wandra Arthurs, M.D. Ph.D.  [facility] [address] [place], [place] [address] Ph: [phone] Fax: [phone]  CC: Jovita Gamma, MD, Crecencio Mc, MD

## 2017-09-22 NOTE — Procedures (Signed)
Full Name: Maria Hinton Gender: Female MRN #: [id number] Date of Birth: 04-Sep-1945    Visit Date: 09/22/2017 11:31 Age: 72 Years 54 Months Old Examining Physician: Marcial Pacas, MD  Referring Physician: Krista Blue, MD History: 72 year old female with history of lumbar decompression surgery continue complains of radiating pain to right lower extremity, mild gait abnormality  Summary of the tests:  Nerve conduction study: Bilateral sural, superficial peroneal sensory responses were absent.  Left ulnar sensory response showed mildly decreased to snap amplitude.  Left radial sensory response was normal.    Left tibial motor responses were normal.  Right tibial motor response showed mildly decreased the C map amplitude, with mildly prolonged distal latency.  Right peroneal to EDB motor response was absent.  Left peroneal to EDB motor response showed moderately decreased the C map amplitude.  Left ulnar motor responses also showed mildly decreased the C map amplitude.  Electromyography: Selective needle examination was performed at bilateral lower extremity muscles bilateral lumbosacral paraspinal muscles.  There is evidence of chronic neuropathic changes involving right tibialis anterior, tibialis posterior, peroneal longus, milder degree biceps femoris long head.  There was no evidence of active denervation at bilateral lumbar sacral paraspinal muscles, but there was evidence of increased insertional activity along the surgical scar.   Conclusion: This is an abnormal study.  There is electrodiagnostic evidence of mild chronic right lumbosacral radiculopathy mainly involving right L4-5 lesser degree of S1 myotomes.  In addition, there is evidence of length dependent axonal sensorimotor polyneuropathy.   ------------------------------- Marcial Pacas, M.D.  [facility] [place], [place] [address] Tel: [phone] Fax: [phone]        [facility]    Nerve /  Sites Muscle Latency Ref. Amplitude Ref. Rel Amp Segments Distance Velocity Ref. Area    ms ms mV mV %  cm m/s m/s mVms  L Ulnar - ADM     Wrist ADM 3.1 ?3.3 4.2 ?6.0 100 Wrist - ADM 7   10.2     B.Elbow ADM 6.5  3.3  78.7 B.Elbow - Wrist 17 51 ?49 9.3     A.Elbow ADM 8.9  2.9  89.9 A.Elbow - B.Elbow 12 49 ?49 8.7         A.Elbow - Wrist      R Peroneal - EDB     Ankle EDB NR ?6.5 NR ?2.0 NR Ankle - EDB 9   NR     Fib head EDB NR  NR  NR Fib head - Ankle   ?44 NR         Pop fossa - Ankle      L Peroneal - EDB     Ankle EDB 5.5 ?6.5 1.1 ?2.0 100 Ankle - EDB 9   3.9     Fib head EDB 12.3  1.1  103 Fib head - Ankle 27 40 ?44 4.1     Pop fossa EDB 14.8  1.2  104 Pop fossa - Fib head 10 40 ?44 4.5         Pop fossa - Ankle      R Tibial - AH     Ankle AH 6.2 ?5.8 2.7 ?4.0 100 Ankle - AH 9   6.4     Pop fossa AH 15.0  1.0  38.7 Pop fossa - Ankle 33 37 ?41 3.0  L Tibial - AH     Ankle AH 5.2 ?5.8 5.1 ?4.0 100 Ankle -  AH 9   14.6     Pop fossa AH 14.0  4.6  91.2 Pop fossa - Ankle 35 40 ?41 12.1               SNC    Nerve / Sites Rec. Site Peak Lat Ref.  Amp Ref. Segments Distance    ms ms V V  cm  L Radial - Anatomical snuff box (Forearm)     Forearm Wrist 2.4 ?2.9 15 ?15 Forearm - Wrist 10  R Sural - Ankle (Calf)     Calf Ankle NR ?4.4 NR ?6 Calf - Ankle 14  L Sural - Ankle (Calf)     Calf Ankle NR ?4.4 NR ?6 Calf - Ankle 14  L Superficial peroneal - Ankle     Lat leg Ankle NR ?4.4 NR ?6 Lat leg - Ankle 14  R Superficial peroneal - Ankle     Lat leg Ankle NR ?4.4 NR ?6 Lat leg - Ankle 14  L Ulnar - Orthodromic, (Dig V, Mid palm)     Dig V Wrist 2.6 ?3.1 3 ?5 Dig V - Wrist 76                 F  Wave    Nerve F Lat Ref.   ms ms  R Tibial - AH 55.8 ?56.0  L Tibial - AH 51.9 ?56.0  L Ulnar - ADM 26.7 ?32.0           H Reflex    Nerve H Lat Lat Hmax   ms ms   Left Right Ref. Left Right Ref.  Tibial - Soleus 35.7 35.9 ?35.0 40.1 42.3 ?35.0         EMG full       EMG  Summary Table    Spontaneous MUAP Recruitment  Muscle IA Fib PSW Fasc Other Amp Dur. Poly Pattern  R. Tibialis anterior Increased None None None _______ Increased Increased Normal Reduced  R. Tibialis posterior Increased None None None _______ Increased Increased 1+ Reduced  R. Peroneus longus Increased None None None _______ Increased Increased Normal Reduced  R. Gastrocnemius (Medial head) Increased None None None _______ Normal Normal Normal Reduced  R. Vastus lateralis Normal None None None _______ Normal Normal Normal Normal  L. Tibialis anterior Normal None None None _______ Normal Normal Normal Normal  L. Tibialis posterior Normal None None None _______ Normal Normal Normal Normal  L. Peroneus longus Normal None None None _______ Normal Normal Normal Normal  L. Gastrocnemius (Medial head) Normal None None None _______ Normal Normal Normal Normal  L. Vastus lateralis Normal None None None _______ Normal Normal Normal Normal  L. Biceps femoris (short head) Normal None None None _______ Normal Normal Normal Normal  R. Biceps femoris (long head) Increased None None None _______ Normal Normal Normal Reduced  R. Lumbar paraspinals (mid) Increased None None None _______ Normal Normal Normal Normal  R. Lumbar paraspinals (low) Increased None None None _______ Normal Normal Normal Normal  L. Lumbar paraspinals (mid) Increased None None None _______ Normal Normal Normal Normal  L. Lumbar paraspinals (low) Increased None None None _______ Normal Normal Normal Normal

## 2017-09-28 ENCOUNTER — Ambulatory Visit: Payer: Medicare HMO | Admitting: Internal Medicine

## 2017-09-28 NOTE — Progress Notes (Deleted)
[facility]      MRN# [id number] Maria Hinton 05/28/1946   Assessment and Plan 72 yo F with COPD Stage B, seen for follow up visit, for COPD  --COPD.  -Doing well, continue spiriva. Doing better now that she stopped smoking, we will trial her off advair, if breathing is worse, then take it once per day. -Abs eosinophil count 06/01/16; 600.   Brief History: 72 yo female with COPD Stage B on Advair and spiriva. Quit smoking 07/2015.   Events since last clinic visit: She feels that breathing is doing well.  Patient is currently on Advair and Spiriva, as prescribed. She feels that they are helping. She has not had to use her rescue inhaler since she was sick. She notes that she is fairly active, she walks her dog twice per day. She does not feel that she is limited by her breathing. Dog sleeps in her bed.  Her last cig was about a year ago. Her breathing is much better since she stopped smoking.   Imaging personally reviewed, CT chest 06/03/17, slight apical groundglass changes, in the right upper lobe, left lower lobe posterior scarring, pleural thickening, likely related to rib fracture in that area, this may be postsurgical change.  Medication:    Current Outpatient Medications:  .  albuterol (PROVENTIL HFA;VENTOLIN HFA) 108 (90 BASE) MCG/ACT inhaler, Inhale 2 puffs into the lungs every 6 (six) hours as needed for wheezing., Disp: 6.7 g, Rfl: 11 .  ALPRAZolam (XANAX) 0.25 MG tablet, TAKE 1 TABLET BY MOUTH TWICE A DAY AS NEEDED, Disp: 45 tablet, Rfl: 1 .  aspirin EC 81 MG tablet, Take 81 mg by mouth daily., Disp: , Rfl:  .  atenolol (TENORMIN) 50 MG tablet, TAKE 1 TABLET (50 MG TOTAL) BY MOUTH 2 (TWO) TIMES DAILY., Disp: 180 tablet, Rfl: 3 .  atorvastatin (LIPITOR) 40 MG tablet, TAKE 1 TABLET BY MOUTH DAILY AT 6 PM., Disp: 90 tablet, Rfl: 3 .  BD PEN NEEDLE NANO U/F 32G X 4 MM MISC, USE DAILY AS DIRECTED, Disp: 100 each, Rfl: 1 .  Blood Glucose  Monitoring Suppl (ONE TOUCH ULTRA SYSTEM KIT) W/DEVICE KIT, Pt needs OneTouch Ultra Blue glucometer dx 250.02, Disp: 1 each, Rfl: 0 .  glipiZIDE (GLUCOTROL) 10 MG tablet, TAKE 1 TABLET BY MOUTH 2 TIMES DAILY BEFORE A MEAL., Disp: 180 tablet, Rfl: 3 .  glucose blood (ONE TOUCH ULTRA TEST) test strip, Use as instructed bid, has OneTouch Ultra Blue  dx: 250.02, Disp: 100 each, Rfl: 3 .  hydrochlorothiazide (MICROZIDE) 12.5 MG capsule, Take 12.5 mg by mouth daily., Disp: , Rfl:  .  Insulin Detemir (LEVEMIR FLEXPEN) 100 UNIT/ML Pen, Inject 20 Units into the skin daily at 10 pm., Disp: 15 mL, Rfl: 11 .  Lancets (ONETOUCH ULTRASOFT) lancets, Use as instructed bid dx 250.02, Disp: 100 each, Rfl: 3 .  LEVEMIR FLEXTOUCH 100 UNIT/ML Pen, INJECT 15 UNITS INTO THE SKIN DAILY AT 10 PM., Disp: 15 pen, Rfl: 2 .  lisinopril (PRINIVIL,ZESTRIL) 10 MG tablet, TAKE 1 TABLET BY MOUTH DAILY, Disp: 90 tablet, Rfl: 1 .  metFORMIN (GLUCOPHAGE) 1000 MG tablet, TAKE 1 TABLET BY MOUTH 2 TIMES DAILY WITH A MEAL., Disp: 180 tablet, Rfl: 3 .  methimazole (TAPAZOLE) 10 MG tablet, Take 1 tablet (10 mg total) by mouth 2 (two) times daily., Disp: 60 tablet, Rfl: 1 .  omeprazole (PRILOSEC) 40 MG capsule, Take 1 capsule (40 mg total) by mouth daily., Disp: 90 capsule,  Rfl: 0 .  traMADol (ULTRAM) 50 MG tablet, Take 1 tablet (50 mg total) by mouth every 6 (six) hours as needed. FOR SPINAL STENOSIS PAIN, Disp: 120 tablet, Rfl: 5    Review of Systems  Constitutional: Negative for chills and fever.  HENT: Negative for hearing loss.   Eyes: Negative.  Negative for pain.  Respiratory: Negative for cough, sputum production, shortness of breath and wheezing.   Cardiovascular: Negative for chest pain and leg swelling.  Gastrointestinal: Negative for abdominal pain, heartburn and vomiting.  Genitourinary: Negative.   Musculoskeletal: Negative for myalgias and neck pain.  Skin: Negative for rash.  Neurological: Negative.  Negative for  headaches.  Endo/Heme/Allergies: Negative.   Psychiatric/Behavioral: Negative.       Allergies:  Vicodin [hydrocodone-acetaminophen]  Physical Examination:  VS: There were no vitals taken for this visit.  General Appearance: No distress  HEENT: PERRLA, no ptosis, no other lesions noticed Pulmonary:good airway entry, no wheezes Cardiovascular:  Normal S1,S2.  No m/r/g.     Abdomen:Exam: Benign, Soft, non-tender, No masses  Skin:   warm, no rashes, no ecchymosis  Extremities: normal, no cyanosis, clubbing, warm with normal capillary refill.        Updated Medication List Outpatient Encounter Medications as of 09/28/2017  Medication Sig  . albuterol (PROVENTIL HFA;VENTOLIN HFA) 108 (90 BASE) MCG/ACT inhaler Inhale 2 puffs into the lungs every 6 (six) hours as needed for wheezing.  Marland Kitchen ALPRAZolam (XANAX) 0.25 MG tablet TAKE 1 TABLET BY MOUTH TWICE A DAY AS NEEDED  . aspirin EC 81 MG tablet Take 81 mg by mouth daily.  Marland Kitchen atenolol (TENORMIN) 50 MG tablet TAKE 1 TABLET (50 MG TOTAL) BY MOUTH 2 (TWO) TIMES DAILY.  Marland Kitchen atorvastatin (LIPITOR) 40 MG tablet TAKE 1 TABLET BY MOUTH DAILY AT 6 PM.  . BD PEN NEEDLE NANO U/F 32G X 4 MM MISC USE DAILY AS DIRECTED  . Blood Glucose Monitoring Suppl (ONE TOUCH ULTRA SYSTEM KIT) W/DEVICE KIT Pt needs OneTouch Ultra Blue glucometer dx 250.02  . glipiZIDE (GLUCOTROL) 10 MG tablet TAKE 1 TABLET BY MOUTH 2 TIMES DAILY BEFORE A MEAL.  Marland Kitchen glucose blood (ONE TOUCH ULTRA TEST) test strip Use as instructed bid, has OneTouch Ultra Blue  dx: 250.02  . hydrochlorothiazide (MICROZIDE) 12.5 MG capsule Take 12.5 mg by mouth daily.  . Insulin Detemir (LEVEMIR FLEXPEN) 100 UNIT/ML Pen Inject 20 Units into the skin daily at 10 pm.  . Lancets (ONETOUCH ULTRASOFT) lancets Use as instructed bid dx 250.02  . LEVEMIR FLEXTOUCH 100 UNIT/ML Pen INJECT 15 UNITS INTO THE SKIN DAILY AT 10 PM.  . lisinopril (PRINIVIL,ZESTRIL) 10 MG tablet TAKE 1 TABLET BY MOUTH DAILY  . metFORMIN  (GLUCOPHAGE) 1000 MG tablet TAKE 1 TABLET BY MOUTH 2 TIMES DAILY WITH A MEAL.  . methimazole (TAPAZOLE) 10 MG tablet Take 1 tablet (10 mg total) by mouth 2 (two) times daily.  Marland Kitchen omeprazole (PRILOSEC) 40 MG capsule Take 1 capsule (40 mg total) by mouth daily.  . traMADol (ULTRAM) 50 MG tablet Take 1 tablet (50 mg total) by mouth every 6 (six) hours as needed. FOR SPINAL STENOSIS PAIN   No facility-administered encounter medications on file as of 09/28/2017.     Orders for this visit: No orders of the defined types were placed in this encounter.   Thank  you for the visitation and for allowing  [facility] to assist in the care of your patient. Our recommendations are noted above.  Please contact us if we can be of further service.  Marda Stalker, MD [facility] Office Number: [phone]

## 2017-09-29 ENCOUNTER — Other Ambulatory Visit: Payer: Self-pay | Admitting: Endocrinology

## 2017-10-01 ENCOUNTER — Other Ambulatory Visit: Payer: Self-pay | Admitting: Internal Medicine

## 2017-10-04 ENCOUNTER — Telehealth: Payer: Self-pay | Admitting: Radiology

## 2017-10-04 DIAGNOSIS — E118 Type 2 diabetes mellitus with unspecified complications: Secondary | ICD-10-CM

## 2017-10-04 DIAGNOSIS — Z794 Long term (current) use of insulin: Secondary | ICD-10-CM

## 2017-10-04 NOTE — Addendum Note (Signed)
Addended by: Crecencio Mc on: 10/04/2017 12:47 PM   Modules accepted: Orders

## 2017-10-04 NOTE — Telephone Encounter (Signed)
Pt coming in for labs tomorrow, orders in for another provider. If you need labs for tomorrow, please place future orders. Thank you.

## 2017-10-05 ENCOUNTER — Ambulatory Visit: Payer: Medicare HMO | Admitting: Internal Medicine

## 2017-10-05 ENCOUNTER — Other Ambulatory Visit (INDEPENDENT_AMBULATORY_CARE_PROVIDER_SITE_OTHER): Payer: Medicare HMO

## 2017-10-05 ENCOUNTER — Encounter: Payer: Self-pay | Admitting: Internal Medicine

## 2017-10-05 VITALS — BP 130/80 | HR 80 | Resp 16 | Ht 64.0 in | Wt 143.0 lb

## 2017-10-05 DIAGNOSIS — J449 Chronic obstructive pulmonary disease, unspecified: Secondary | ICD-10-CM

## 2017-10-05 DIAGNOSIS — E0590 Thyrotoxicosis, unspecified without thyrotoxic crisis or storm: Secondary | ICD-10-CM

## 2017-10-05 DIAGNOSIS — Z794 Long term (current) use of insulin: Secondary | ICD-10-CM

## 2017-10-05 DIAGNOSIS — E118 Type 2 diabetes mellitus with unspecified complications: Secondary | ICD-10-CM | POA: Diagnosis not present

## 2017-10-05 DIAGNOSIS — J438 Other emphysema: Secondary | ICD-10-CM | POA: Diagnosis not present

## 2017-10-05 LAB — COMPREHENSIVE METABOLIC PANEL
ALT: 17 U/L (ref 0–35)
AST: 17 U/L (ref 0–37)
Albumin: 3.3 g/dL — ABNORMAL LOW (ref 3.5–5.2)
Alkaline Phosphatase: 121 U/L — ABNORMAL HIGH (ref 39–117)
BUN: 24 mg/dL — ABNORMAL HIGH (ref 6–23)
CO2: 30 mEq/L (ref 19–32)
Calcium: 8.6 mg/dL (ref 8.4–10.5)
Chloride: 100 mEq/L (ref 96–112)
Creatinine, Ser: 0.89 mg/dL (ref 0.40–1.20)
GFR: 66.71 mL/min (ref >60.00)
Glucose, Bld: 102 mg/dL — ABNORMAL HIGH (ref 70–99)
Potassium: 3.8 mEq/L (ref 3.5–5.1)
Sodium: 137 mEq/L (ref 135–145)
Total Bilirubin: 0.4 mg/dL (ref 0.2–1.2)
Total Protein: 6.2 g/dL (ref 6.0–8.3)

## 2017-10-05 LAB — LIPID PANEL
Cholesterol: 118 mg/dL (ref 0–200)
HDL: 42.5 mg/dL (ref >39.00)
LDL Cholesterol: 53 mg/dL (ref 0–99)
NonHDL: 75.19
Total CHOL/HDL Ratio: 3
Triglycerides: 112 mg/dL (ref 0.0–149.0)
VLDL: 22.4 mg/dL (ref 0.0–40.0)

## 2017-10-05 LAB — T3, FREE: T3, Free: 4.9 pg/mL — ABNORMAL HIGH (ref 2.3–4.2)

## 2017-10-05 LAB — HEMOGLOBIN A1C: Hgb A1c MFr Bld: 7 % — ABNORMAL HIGH (ref 4.6–6.5)

## 2017-10-05 LAB — T4, FREE: Free T4: 2.1 ng/dL — ABNORMAL HIGH (ref 0.60–1.60)

## 2017-10-05 MED ORDER — ALBUTEROL SULFATE HFA 108 (90 BASE) MCG/ACT IN AERS: 2.0000 | INHALATION_SPRAY | Freq: Four times a day (QID) | RESPIRATORY_TRACT | 11 refills | Status: DC | PRN

## 2017-10-05 NOTE — Progress Notes (Signed)
[facility]      MRN# [id number] Maria Hinton 12/05/1945   Assessment and Plan 72 yo F with COPD Stage B, seen for follow up visit, for COPD  --COPD.  -Patient is doing well, she has quit smoking more than a year ago, since that time she has been able to come off of both Advair and Spiriva, she has not had to use her rescue inhaler in nearly a year. -Her rescue inhaler prescription is refilled today, he does not need to restart Advair or Spiriva. -Abs eosinophil count 06/01/16; 600.  Meds ordered this encounter  Medications  . albuterol (PROVENTIL HFA;VENTOLIN HFA) 108 (90 Base) MCG/ACT inhaler    Sig: Inhale 2 puffs into the lungs every 6 (six) hours as needed for wheezing.    Dispense:  6.7 g    Refill:  11   Return if symptoms worsen or fail to improve.    Brief History: 72 yo female with COPD Stage B, Quit smoking 07/2015, currently only on rescue inhaler.    Events since last clinic visit: She feels that breathing is doing well. She feels that her breathing is doing well. She ran out of inhalers. At last visit she was trial off of advair, and did fine, she then decided stop spiriva as well, and also did well.  She last smoked more than a year ago. She has not used her proair in about 10 months ago.   She notes that she is fairly active, she walks her dog twice per day. She does not feel that she is limited by her breathing. Dog sleeps on the floor of her bedroom.   CT chest 06/03/17, slight apical groundglass changes, in the right upper lobe, left lower lobe posterior scarring, pleural thickening, likely related to rib fracture in that area, this may be postsurgical change.  Medication:    Current Outpatient Medications:  .  albuterol (PROVENTIL HFA;VENTOLIN HFA) 108 (90 BASE) MCG/ACT inhaler, Inhale 2 puffs into the lungs every 6 (six) hours as needed for wheezing., Disp: 6.7 g, Rfl: 11 .  ALPRAZolam (XANAX) 0.25 MG tablet, TAKE 1  TABLET BY MOUTH TWICE A DAY AS NEEDED, Disp: 45 tablet, Rfl: 1 .  aspirin EC 81 MG tablet, Take 81 mg by mouth daily., Disp: , Rfl:  .  atenolol (TENORMIN) 50 MG tablet, TAKE 1 TABLET (50 MG TOTAL) BY MOUTH 2 (TWO) TIMES DAILY., Disp: 180 tablet, Rfl: 3 .  atorvastatin (LIPITOR) 40 MG tablet, TAKE 1 TABLET BY MOUTH DAILY AT 6 PM., Disp: 90 tablet, Rfl: 3 .  BD PEN NEEDLE NANO U/F 32G X 4 MM MISC, USE DAILY AS DIRECTED, Disp: 100 each, Rfl: 1 .  Blood Glucose Monitoring Suppl (ONE TOUCH ULTRA SYSTEM KIT) W/DEVICE KIT, Pt needs OneTouch Ultra Blue glucometer dx 250.02, Disp: 1 each, Rfl: 0 .  glipiZIDE (GLUCOTROL) 10 MG tablet, TAKE 1 TABLET BY MOUTH 2 TIMES DAILY BEFORE A MEAL., Disp: 180 tablet, Rfl: 3 .  glucose blood (ONE TOUCH ULTRA TEST) test strip, Use as instructed bid, has OneTouch Ultra Blue  dx: 250.02, Disp: 100 each, Rfl: 3 .  hydrochlorothiazide (MICROZIDE) 12.5 MG capsule, Take 12.5 mg by mouth daily., Disp: , Rfl:  .  Insulin Detemir (LEVEMIR FLEXPEN) 100 UNIT/ML Pen, Inject 20 Units into the skin daily at 10 pm., Disp: 15 mL, Rfl: 11 .  Lancets (ONETOUCH ULTRASOFT) lancets, Use as instructed bid dx 250.02, Disp: 100 each, Rfl: 3 .  LEVEMIR  FLEXTOUCH 100 UNIT/ML Pen, INJECT 15 UNITS INTO THE SKIN DAILY AT 10 PM., Disp: 15 pen, Rfl: 2 .  lisinopril (PRINIVIL,ZESTRIL) 10 MG tablet, TAKE 1 TABLET BY MOUTH DAILY, Disp: 90 tablet, Rfl: 1 .  metFORMIN (GLUCOPHAGE) 1000 MG tablet, TAKE 1 TABLET BY MOUTH 2 TIMES DAILY WITH A MEAL., Disp: 180 tablet, Rfl: 3 .  methimazole (TAPAZOLE) 10 MG tablet, TAKE 1 TABLET BY MOUTH TWICE A DAY, Disp: 60 tablet, Rfl: 0 .  omeprazole (PRILOSEC) 40 MG capsule, TAKE 1 CAPSULE BY MOUTH EVERY DAY, Disp: 90 capsule, Rfl: 0 .  traMADol (ULTRAM) 50 MG tablet, Take 1 tablet (50 mg total) by mouth every 6 (six) hours as needed. FOR SPINAL STENOSIS PAIN, Disp: 120 tablet, Rfl: 5    Review of Systems  Constitutional: Negative for chills and fever.  HENT: Negative  for hearing loss.   Eyes: Negative.  Negative for pain.  Respiratory: Negative for cough, sputum production, shortness of breath and wheezing.   Cardiovascular: Negative for chest pain and leg swelling.  Gastrointestinal: Negative for abdominal pain, heartburn and vomiting.  Genitourinary: Negative.   Musculoskeletal: Negative for myalgias and neck pain.  Skin: Negative for rash.  Neurological: Negative.  Negative for headaches.  Endo/Heme/Allergies: Negative.   Psychiatric/Behavioral: Negative.       Allergies:  Vicodin [hydrocodone-acetaminophen]  Physical Examination:  VS: BP 130/80 (BP Location: Left Arm, Cuff Size: Normal)   Pulse 80   Resp 16   Ht 5' 4"  (1.626 m)   Wt 143 lb (64.9 kg)   SpO2 99%   BMI 24.55 kg/m   General Appearance: No distress  HEENT: PERRLA, no ptosis, no other lesions noticed Pulmonary:good airway entry, no wheezes Cardiovascular:  Normal S1,S2.  No m/r/g.     Abdomen:Exam: Benign, Soft, non-tender, No masses  Skin:   warm, no rashes, no ecchymosis  Extremities: normal, no cyanosis, clubbing, warm with normal capillary refill.        Updated Medication List Outpatient Encounter Medications as of 10/05/2017  Medication Sig  . albuterol (PROVENTIL HFA;VENTOLIN HFA) 108 (90 BASE) MCG/ACT inhaler Inhale 2 puffs into the lungs every 6 (six) hours as needed for wheezing.  Marland Kitchen ALPRAZolam (XANAX) 0.25 MG tablet TAKE 1 TABLET BY MOUTH TWICE A DAY AS NEEDED  . aspirin EC 81 MG tablet Take 81 mg by mouth daily.  Marland Kitchen atenolol (TENORMIN) 50 MG tablet TAKE 1 TABLET (50 MG TOTAL) BY MOUTH 2 (TWO) TIMES DAILY.  Marland Kitchen atorvastatin (LIPITOR) 40 MG tablet TAKE 1 TABLET BY MOUTH DAILY AT 6 PM.  . BD PEN NEEDLE NANO U/F 32G X 4 MM MISC USE DAILY AS DIRECTED  . Blood Glucose Monitoring Suppl (ONE TOUCH ULTRA SYSTEM KIT) W/DEVICE KIT Pt needs OneTouch Ultra Blue glucometer dx 250.02  . glipiZIDE (GLUCOTROL) 10 MG tablet TAKE 1 TABLET BY MOUTH 2 TIMES DAILY BEFORE A MEAL.    Marland Kitchen glucose blood (ONE TOUCH ULTRA TEST) test strip Use as instructed bid, has OneTouch Ultra Blue  dx: 250.02  . hydrochlorothiazide (MICROZIDE) 12.5 MG capsule Take 12.5 mg by mouth daily.  . Insulin Detemir (LEVEMIR FLEXPEN) 100 UNIT/ML Pen Inject 20 Units into the skin daily at 10 pm.  . Lancets (ONETOUCH ULTRASOFT) lancets Use as instructed bid dx 250.02  . LEVEMIR FLEXTOUCH 100 UNIT/ML Pen INJECT 15 UNITS INTO THE SKIN DAILY AT 10 PM.  . lisinopril (PRINIVIL,ZESTRIL) 10 MG tablet TAKE 1 TABLET BY MOUTH DAILY  . metFORMIN (GLUCOPHAGE) 1000 MG tablet TAKE  1 TABLET BY MOUTH 2 TIMES DAILY WITH A MEAL.  . methimazole (TAPAZOLE) 10 MG tablet TAKE 1 TABLET BY MOUTH TWICE A DAY  . omeprazole (PRILOSEC) 40 MG capsule TAKE 1 CAPSULE BY MOUTH EVERY DAY  . traMADol (ULTRAM) 50 MG tablet Take 1 tablet (50 mg total) by mouth every 6 (six) hours as needed. FOR SPINAL STENOSIS PAIN   No facility-administered encounter medications on file as of 10/05/2017.     Orders for this visit: No orders of the defined types were placed in this encounter.   Thank  you for the visitation and for allowing  [facility] to assist in the care of your patient. Our recommendations are noted above.  Please contact us if we can be of further service.  Marda Stalker, MD [facility] Office Number: [phone]

## 2017-10-05 NOTE — Patient Instructions (Signed)
Quitting smoking is the best thing that you did for your breathing.

## 2017-10-07 ENCOUNTER — Encounter: Payer: Self-pay | Admitting: Endocrinology

## 2017-10-07 ENCOUNTER — Ambulatory Visit: Payer: Medicare HMO | Admitting: Endocrinology

## 2017-10-07 VITALS — BP 124/68 | HR 60 | Ht 64.0 in | Wt 144.0 lb

## 2017-10-07 DIAGNOSIS — E0590 Thyrotoxicosis, unspecified without thyrotoxic crisis or storm: Secondary | ICD-10-CM | POA: Diagnosis not present

## 2017-10-07 DIAGNOSIS — E0500 Thyrotoxicosis with diffuse goiter without thyrotoxic crisis or storm: Secondary | ICD-10-CM | POA: Diagnosis not present

## 2017-10-07 MED ORDER — METHIMAZOLE 10 MG PO TABS: 10.0000 mg | ORAL_TABLET | Freq: Three times a day (TID) | ORAL | 1 refills | Status: DC

## 2017-10-07 NOTE — Patient Instructions (Signed)
3 pills of Methimazole daily

## 2017-10-07 NOTE — Progress Notes (Signed)
Patient ID: Maria Hinton, female   DOB: [birth year], 72 y.o.   MRN: [id number]                                                                                                              Reason for Appointment:  Hyperthyroidism, follow-up visit   Referring physician: Derrel Nip   Chief complaint: Weakness   History of Present Illness:   Prior history:  For several months before her consultation she had symptoms of weakness, palpitations, shakiness, feeling sweaty in am, nervousness, and fatigue.  Also would feel a strong heartbeat in her ears She had been on atenolol for hypertension also She has had a decreased appetite and started losing weight late last summer  The patient  lost about  26 lbs since these symptoms started   RECENT history:  She was found to have hypothyroidism secondary to Graves' disease on her initial evaluation She is taking methimazole 10 mg twice daily to treat this, starting on 3/19  Currently she is feeling less fatigued and has less weakness. However she still states her appetite is decreased and has lost some weight No significant shakiness or palpitations now  She continues to take atenolol which she has been taking for several years for hypertension  Wt Readings from Last 3 Encounters:  10/07/17 144 lb (65.3 kg)  10/05/17 143 lb (64.9 kg)  09/07/17 148 lb 12.8 oz (67.5 kg)     Thyroid levels are still elevated although improved from before  Thyroid function tests as follows:     Lab Results  Component Value Date   FREET4 2.10 (H) 10/05/2017   FREET4 4.15 (H) 05/24/2017   T3FREE 4.9 (H) 10/05/2017   TSH <0.006 (L) 08/26/2017   TSH <0.01 Repeated and verified X2. (L) 05/24/2017   TSH 1.31 11/28/2013    Lab Results  Component Value Date   THYROTRECAB 3.76 (H) 09/07/2017     Allergies as of 10/07/2017      Reactions   Vicodin [hydrocodone-acetaminophen] Other (See Comments)   Insomnia       Medication List        Accurate as of  10/07/17  1:54 PM. Always use your most recent med list.          albuterol 108 (90 Base) MCG/ACT inhaler Commonly known as:  PROVENTIL HFA;VENTOLIN HFA Inhale 2 puffs into the lungs every 6 (six) hours as needed for wheezing.   ALPRAZolam 0.25 MG tablet Commonly known as:  XANAX TAKE 1 TABLET BY MOUTH TWICE A DAY AS NEEDED   aspirin EC 81 MG tablet Take 81 mg by mouth daily.   atenolol 50 MG tablet Commonly known as:  TENORMIN TAKE 1 TABLET (50 MG TOTAL) BY MOUTH 2 (TWO) TIMES DAILY.   atorvastatin 40 MG tablet Commonly known as:  LIPITOR TAKE 1 TABLET BY MOUTH DAILY AT 6 PM.   BD PEN NEEDLE NANO U/F 32G X 4 MM Misc Generic drug:  Insulin Pen Needle USE DAILY AS DIRECTED   glipiZIDE 10 MG  tablet Commonly known as:  GLUCOTROL TAKE 1 TABLET BY MOUTH 2 TIMES DAILY BEFORE A MEAL.   glucose blood test strip Commonly known as:  ONE TOUCH ULTRA TEST Use as instructed bid, has OneTouch Ultra Blue  dx: 250.02   hydrochlorothiazide 12.5 MG capsule Commonly known as:  MICROZIDE Take 12.5 mg by mouth daily.   Insulin Detemir 100 UNIT/ML Pen Commonly known as:  LEVEMIR FLEXPEN Inject 20 Units into the skin daily at 10 pm.   lisinopril 10 MG tablet Commonly known as:  PRINIVIL,ZESTRIL TAKE 1 TABLET BY MOUTH DAILY   metFORMIN 1000 MG tablet Commonly known as:  GLUCOPHAGE TAKE 1 TABLET BY MOUTH 2 TIMES DAILY WITH A MEAL.   methimazole 10 MG tablet Commonly known as:  TAPAZOLE TAKE 1 TABLET BY MOUTH TWICE A DAY   omeprazole 40 MG capsule Commonly known as:  PRILOSEC TAKE 1 CAPSULE BY MOUTH EVERY DAY   ONE TOUCH ULTRA SYSTEM KIT w/Device Kit Pt needs OneTouch Ultra Blue glucometer dx 250.02   onetouch ultrasoft lancets Use as instructed bid dx 250.02   traMADol 50 MG tablet Commonly known as:  ULTRAM Take 1 tablet (50 mg total) by mouth every 6 (six) hours as needed. FOR SPINAL STENOSIS PAIN           Past Medical History:  Diagnosis Date  . Actinic keratoses      follows with dermatology   . Arthritis   . Asthma   . Chronic back pain    DDD,herniated disc/spondylosis/radiculopathy  . COPD (chronic obstructive pulmonary disease) ([place])   . Diabetes mellitus    1980s  . Early cataracts, bilateral    Dr. Ruthine Dose - [facility]  . GERD (gastroesophageal reflux disease)    takes Omeprazole daily  . Hemorrhoids   . History of colonic polyps    colonoscopy 04/2012 - Dr Candace Cruise  . Hyperlipidemia    takes Pravastatin daily  . Hypertension    takes Metoprolol/HCTZ daily  . Insomnia   . Joint pain    fingers  . Joint swelling   . Pneumonia    x 2 ;in Dec 2010/2011  . PONV (postoperative nausea and vomiting)   . Right leg weakness     Past Surgical History:  Procedure Laterality Date  . ABDOMINAL HYSTERECTOMY     at age 53  . BACK SURGERY  20+yrs ago  . COLONOSCOPY    . COLONOSCOPY N/A 05/13/2015   Procedure: COLONOSCOPY;  Surgeon: Hulen Luster, MD;  Location: [facility] ENDOSCOPY;  Service: Gastroenterology;  Laterality: N/A;  . ESOPHAGOGASTRODUODENOSCOPY  2011  . ESOPHAGOGASTRODUODENOSCOPY (EGD) WITH PROPOFOL N/A 09/02/2017   Procedure: ESOPHAGOGASTRODUODENOSCOPY (EGD) WITH PROPOFOL;  Surgeon: Virgel Manifold, MD;  Location: [facility] ENDOSCOPY;  Service: Endoscopy;  Laterality: N/A;  . FOOT SURGERY  2011   bunionectomy and knot removed from bottom of both feet  . JOINT REPLACEMENT Left 2014   hip  Kraskinski  . LUMBAR LAMINECTOMY/DECOMPRESSION MICRODISCECTOMY  09/10/2011   Procedure: LUMBAR LAMINECTOMY/DECOMPRESSION MICRODISCECTOMY 1 LEVEL;  Surgeon: Hosie Spangle, MD;  Location: [place] NEURO ORS;  Service: Neurosurgery;  Laterality: Right;  RIGHT Lumbar  Laminotomy and microdiskectomy Lumbar Four-Five  . LUMBAR LAMINECTOMY/DECOMPRESSION MICRODISCECTOMY N/A 06/26/2015   Procedure: LUMBAR LAMINECTOMY/DECOMPRESSION MICRODISCECTOMY 1 LEVEL;  Surgeon: Jovita Gamma, MD;  Location: [place] [facility]UR[facility] ORS;  Service: Neurosurgery;  Laterality: N/A;  L3 and  L4 Laminectomies  . MELANOMA EXCISION  2011   on nose/back on neck  . THORACIC DISC SURGERY  at age 57 and 89    mass of veins that had gotten into muscle and wrapped around rib;3 ribs also removed from left side  . TONSILLECTOMY  as a child   and adenoids   . TOTAL HIP ARTHROPLASTY Left 2014   Dr. Mack Guise  . TUBAL LIGATION    . VAGINA SURGERY     vaginal wall ruptured     Family History  Problem Relation Age of Onset  . Heart disease Brother        valve replacement  . COPD Brother   . COPD Mother        Emphysema  . Diabetes Mother   . Other Mother        Died in house fire at age 20.  Marland Kitchen Heart disease Father   . Seizures Father   . Cancer Father        Lung cancer with brain metastasis  . Asthma Brother   . COPD Son 42  . Cancer Other        aunt ? maternal vs paternal side lung cancer   . Diabetes Maternal Grandmother   . Anesthesia problems Neg Hx   . Hypotension Neg Hx   . Malignant hyperthermia Neg Hx   . Pseudochol deficiency Neg Hx   . Thyroid disease Neg Hx     Social History:  reports that she quit smoking about 15 months ago. Her smoking use included cigarettes. She has a 5.00 pack-year smoking history. She has never used smokeless tobacco. She reports that she does not drink alcohol or use drugs.  Allergies:  Allergies  Allergen Reactions  . Vicodin [Hydrocodone-Acetaminophen] Other (See Comments)    Insomnia      Review of Systems  Constitutional: Positive for weight loss and reduced appetite.  HENT:       She feels a lump in her throat in the middle but no difficulty swallowing food  Respiratory: Positive for shortness of breath.   Cardiovascular: Positive for leg swelling.       Rt  Gastrointestinal: Positive for constipation.  Endocrine: Positive for fatigue and general weakness. Negative for light-headedness.  Musculoskeletal: Positive for joint pain and back pain.       Has chronic back pain requiring multiple surgeries and also has had  some finger joint pains  Skin: Negative for rash.  Neurological: Positive for weakness and tremors.       Has more weakness on her she has generalized weakness but more so on the right leg, reportedly has had some disc problems also       Examination:   BP 124/68 (BP Location: Left Arm, Patient Position: Sitting, Cuff Size: Normal)   Pulse 60   Ht _0  (1.626 m)   Wt 144 lb (65.3 kg)   SpO2 96%   BMI 24.72 kg/m    General Appearance:  She looks well    Bilateral fine tremors are present, mild. Deep tendon reflexes at biceps are appearing normal. Her  eyes look normal externally   Assessment/Plan:   Hyperthyroidism, diagnosed to be from Graves' disease   She has been subjectively feeling better with starting methimazole 10 mg twice daily about a month ago However she still has some symptoms and is not back to normal but her weakness and fatigue as well as still having some weight loss She has been regularly taking her medication Exam still shows some hyperthyroid weakness arms  Some of her weight loss may have been related  to an episode of gastroenteritis over the last week or so that has resolved  Discussed the previous labs including thyrotropin receptor antibody indicating she has an autoimmune condition  Since her thyroid levels are still significantly above normal she will increase her methimazole to 30 mg a day and follow-up in another month    Elayne Snare 10/07/2017, 1:54 PM    Note: This office note was prepared with Dragon voice recognition system technology. Any transcriptional errors that result from this process are unintentional.

## 2017-10-08 ENCOUNTER — Encounter: Payer: Self-pay | Admitting: Internal Medicine

## 2017-10-08 ENCOUNTER — Ambulatory Visit: Payer: Medicare HMO | Admitting: Internal Medicine

## 2017-10-08 VITALS — BP 108/64 | HR 73 | Temp 98.0°F | Resp 14 | Ht 64.0 in | Wt 144.8 lb

## 2017-10-08 DIAGNOSIS — E119 Type 2 diabetes mellitus without complications: Secondary | ICD-10-CM

## 2017-10-08 DIAGNOSIS — R51 Headache: Secondary | ICD-10-CM

## 2017-10-08 DIAGNOSIS — Z794 Long term (current) use of insulin: Secondary | ICD-10-CM

## 2017-10-08 DIAGNOSIS — Z9889 Other specified postprocedural states: Secondary | ICD-10-CM

## 2017-10-08 DIAGNOSIS — R748 Abnormal levels of other serum enzymes: Secondary | ICD-10-CM

## 2017-10-08 DIAGNOSIS — R519 Headache, unspecified: Secondary | ICD-10-CM

## 2017-10-08 MED ORDER — ALPRAZOLAM 0.25 MG PO TABS: 0.2500 mg | ORAL_TABLET | Freq: Two times a day (BID) | ORAL | 5 refills | Status: DC | PRN

## 2017-10-08 NOTE — Progress Notes (Signed)
Subjective:  Patient ID: Maria Hinton, female    DOB: 05/08/1946  Age: 72 y.o. MRN: [id number]  CC: The primary encounter diagnosis was Elevated liver enzymes. Diagnoses of History of lumbar laminectomy for spinal cord decompression, Type 2 diabetes mellitus without complication, with long-term current use of insulin ([place]), and Recurrent headache were also pertinent to this visit.  HPI Maria Hinton presents for 3 month follow up on diabetes.  Patient has no complaints today.  Patient is following a low glycemic index diet and taking all prescribed medications regularly without side effects.  Fasting sugars have been under less than 140 most of the time and post prandials have been under 160 except on rare occasions. Patient is exercising about 3 times per week and intentionally trying to lose weight .  Patient has had an eye exam in the last 12 months and checks feet regularly for signs of infection.  Patient does not walk barefoot outside,  And denies an numbness tingling or burning in feet. Patient is up to date on all recommended vaccinations  Multiple specialist visits since last OV with me.    Follow up on dysphagia  With  Nausea,  Wt loss.  Was referred to GI .  Barrett's,  EE ruled out with endoscopy and biopsies.  Still feels subjective knot in her throat.  Feels regurgitation is occurring after swallowing,  But does not actually cough  Or choke   Was referred to endocrine in November for elevated T4 but did not go.  Thyrotoxicosis diagnosed and now under management by Endicrinology with monthly visits.  .  Wt loss of 27 lbs over the last year noted.   Had lost 4 more lbs at last follow up,    Felt better initially but last week started throwing up again, had 6 days of vomiting ,  Waking up in middle of night vomiting.  None since Monday  As of yesterday dose of  tapazole  Was increased to tid yesterday.     Headaches occurring on the left side of head parietal area.  ocurring daily at  least, throbbing in nature, occurring usually at rest  And short lived .  Occasionally last as long as one hour    Sciatica:  Neurosurgery referral to [facility] for sciatica .  MRI cervical spine done to eval weakness, ordered by Neurology,  Unremarkable     EMG [place] studies were  done  And showed a radiculopathy right l4-5   And s1, polyneuropathy .  NEUROSURGERY FOLLOW U P  IS MAY 7 WITH [facility]   Saw Pulmonology:  Doing well  Dermatology next week for annual follow up   Requesting refill on alprazolam ,  Using 0.5 mg at night   Outpatient Medications Prior to Visit  Medication Sig Dispense Refill  . albuterol (PROVENTIL HFA;VENTOLIN HFA) 108 (90 Base) MCG/ACT inhaler Inhale 2 puffs into the lungs every 6 (six) hours as needed for wheezing. 6.7 g 11  . aspirin EC 81 MG tablet Take 81 mg by mouth daily.    Marland Kitchen atenolol (TENORMIN) 50 MG tablet TAKE 1 TABLET (50 MG TOTAL) BY MOUTH 2 (TWO) TIMES DAILY. 180 tablet 3  . atorvastatin (LIPITOR) 40 MG tablet TAKE 1 TABLET BY MOUTH DAILY AT 6 PM. 90 tablet 3  . BD PEN NEEDLE NANO U/F 32G X 4 MM MISC USE DAILY AS DIRECTED 100 each 1  . Blood Glucose Monitoring Suppl (ONE TOUCH ULTRA SYSTEM KIT) W/DEVICE KIT Pt needs  OneTouch Ultra Blue glucometer dx 250.02 1 each 0  . glipiZIDE (GLUCOTROL) 10 MG tablet TAKE 1 TABLET BY MOUTH 2 TIMES DAILY BEFORE A MEAL. 180 tablet 3  . glucose blood (ONE TOUCH ULTRA TEST) test strip Use as instructed bid, has OneTouch Ultra Blue  dx: 250.02 100 each 3  . hydrochlorothiazide (MICROZIDE) 12.5 MG capsule Take 12.5 mg by mouth daily.    . Insulin Detemir (LEVEMIR FLEXPEN) 100 UNIT/ML Pen Inject 20 Units into the skin daily at 10 pm. 15 mL 11  . Lancets (ONETOUCH ULTRASOFT) lancets Use as instructed bid dx 250.02 100 each 3  . lisinopril (PRINIVIL,ZESTRIL) 10 MG tablet TAKE 1 TABLET BY MOUTH DAILY 90 tablet 1  . metFORMIN (GLUCOPHAGE) 1000 MG tablet TAKE 1 TABLET BY MOUTH 2 TIMES DAILY WITH A MEAL. 180 tablet 3  . methimazole  (TAPAZOLE) 10 MG tablet Take 1 tablet (10 mg total) by mouth 3 (three) times daily. 90 tablet 1  . omeprazole (PRILOSEC) 40 MG capsule TAKE 1 CAPSULE BY MOUTH EVERY DAY 90 capsule 0  . traMADol (ULTRAM) 50 MG tablet Take 1 tablet (50 mg total) by mouth every 6 (six) hours as needed. FOR SPINAL STENOSIS PAIN 120 tablet 5  . ALPRAZolam (XANAX) 0.25 MG tablet TAKE 1 TABLET BY MOUTH TWICE A DAY AS NEEDED 45 tablet 1   No facility-administered medications prior to visit.     Review of Systems;  Patient denies headache, fevers, malaise, unintentional weight loss, skin rash, eye pain, sinus congestion and sinus pain, sore throat, dysphagia,  hemoptysis , cough, dyspnea, wheezing, chest pain, palpitations, orthopnea, edema, abdominal pain, nausea, melena, diarrhea, constipation, flank pain, dysuria, hematuria, urinary  Frequency, nocturia, numbness, tingling, seizures,  Focal weakness, Loss of consciousness,  Tremor, insomnia, depression, anxiety, and suicidal ideation.      Objective:  BP 108/64 (BP Location: Left Arm, Patient Position: Sitting, Cuff Size: Normal)   Pulse 73   Temp 98 F (36.7 C) (Oral)   Resp 14   Ht 5' 4"  (1.626 m)   Wt 144 lb 12.8 oz (65.7 kg)   SpO2 97%   BMI 24.85 kg/m   BP Readings from Last 3 Encounters:  10/08/17 108/64  10/07/17 124/68  10/05/17 130/80    Wt Readings from Last 3 Encounters:  10/08/17 144 lb 12.8 oz (65.7 kg)  10/07/17 144 lb (65.3 kg)  10/05/17 143 lb (64.9 kg)    General appearance: alert, cooperative and appears stated age Ears: normal TM's and external ear canals both ears Throat: lips, mucosa, and tongue normal; teeth and gums normal Neck: no adenopathy, no carotid bruit, supple, symmetrical, trachea midline and thyroid not enlarged, symmetric, no tenderness/mass/nodules Back: symmetric, no curvature. ROM normal. No CVA tenderness. Lungs: clear to auscultation bilaterally Heart: regular rate and rhythm, S1, S2 normal, no murmur,  click, rub or gallop Abdomen: soft, non-tender; bowel sounds normal; no masses,  no organomegaly Pulses: 2+ and symmetric Skin: Skin color, texture, turgor normal. No rashes or lesions Lymph nodes: Cervical, supraclavicular, and axillary nodes normal.  Lab Results  Component Value Date   HGBA1C 7.0 (H) 10/05/2017   HGBA1C 7.4 (H) 07/07/2017   HGBA1C 6.8 (H) 04/01/2017    Lab Results  Component Value Date   CREATININE 0.89 10/05/2017   CREATININE 0.94 07/07/2017   CREATININE 0.91 05/24/2017    Lab Results  Component Value Date   WBC 7.1 05/24/2017   HGB 13.3 05/24/2017   HCT 40.2 05/24/2017   PLT  272.0 05/24/2017   GLUCOSE 102 (H) 10/05/2017   CHOL 118 10/05/2017   TRIG 112.0 10/05/2017   HDL 42.50 10/05/2017   LDLDIRECT 81.0 07/07/2017   LDLCALC 53 10/05/2017   ALT 17 10/05/2017   AST 17 10/05/2017   NA 137 10/05/2017   K 3.8 10/05/2017   CL 100 10/05/2017   CREATININE 0.89 10/05/2017   BUN 24 (H) 10/05/2017   CO2 30 10/05/2017   TSH <0.006 (L) 08/26/2017   INR 0.9 11/21/2012   HGBA1C 7.0 (H) 10/05/2017   MICROALBUR 1.6 07/07/2017    Mr Cervical Spine Wo Contrast  Result Date: 09/05/2017  [facility] [address], [place], [place] [address] [phone] NEUROIMAGING REPORT STUDY DATE:09/05/2017 PATIENT NAME: DORALYN KIRKES DOB: [birth year] MRN: [id number] ORDERING CLINICIAN: DR Krista Blue CLINICAL HISTORY:  71 year patient being evaluated for weakness COMPARISON FILMS: none EXAM: MRI Cervical Spine wo TECHNIQUE: MRI of the cervical spine was obtained utilizing 3 mm sagittal slices from the posterior fossa down to the T3-4 level with T1, T2 and inversion recovery views. In addition 4 mm axial slices from O8-4 down to T1-2 level were included with T2 and gradient echo views. CONTRAST: none IMAGING SITE: [facility] FINDINGS: The cervical vertebrae demonstrate slight loss of forward lordotic curvature and posterior subluxation of C4 over C3 vertebrae.   The intervertebral disc show minor loss of disc height.  The vertebral body heights appear normal with abnormal marrow signal seen in the body of C6 which may represent hemangioma.  There is no frank disc herniation or significant cord compression root or foraminal encroachment.  There is hypertrophy of ligamentum flavum noted posteriorly.  There is mild canal narrowing at C4-5 and C5-6 due to facet hypertrophy resulting in mild foraminal narrowing but without definite compression.  Visualized portion of the upper thoracic spine appear unremarkable.  The craniovertebral junction and lower portion of the cerebellum as well as visualized paraspinal soft tissue appear unremarkable.-Noted during   Unremarkable MRI scan of the cervical spine without contrast. INTERPRETING PHYSICIAN: PRAMOD SETHI, MD Certified in  Neuroimaging by [employer] and [employer]    Assessment & Plan:   Problem List Items Addressed This Visit    Recurrent headache    Left parietal, lasting < 1 hours and no vision changes. Will follow,  MRI if severity increases      History of lumbar laminectomy for spinal cord decompression    Wit persistent radiculopathy at L4-5 and at S1 noted on recent EMG/[place]  Studies.  Has follow up with [facility] May 7       Elevated liver enzymes - Primary    Etiology unclear.  Will repeat with TSH and begin diagnostic workup if still elevated.       Relevant Orders   Comprehensive metabolic panel   DM type 2 (diabetes mellitus, type 2) ([place])    Improved control  Due to weight loss. Hypoglycemia occurring  secondary to patient's poor appetite and recurrent N/V.   Reducing evening dose of glipizide  Patient has no microalbuminuria. Patient is tolerating statin therapy for CAD risk reduction and on ACE/ARB for renal protection and hypertension   Lab Results  Component Value Date   HGBA1C 7.0 (H) 10/05/2017   . Lab Results  Component  Value Date   MICROALBUR 1.6 07/07/2017          A total of 25 minutes was spent with patient more than half of  which was spent in counseling patient on the above mentioned issues , reviewing and explaining recent labs and imaging studies done, and coordination of care.  I have changed Ralyn F. Giammarino's ALPRAZolam. I am also having her maintain her glucose blood, ONE TOUCH ULTRA SYSTEM KIT, onetouch ultrasoft, aspirin EC, atenolol, atorvastatin, glipiZIDE, metFORMIN, Insulin Detemir, traMADol, hydrochlorothiazide, lisinopril, BD PEN NEEDLE NANO U/F, omeprazole, albuterol, and methimazole.  Meds ordered this encounter  Medications  . ALPRAZolam (XANAX) 0.25 MG tablet    Sig: Take 1 tablet (0.25 mg total) by mouth 2 (two) times daily as needed.    Dispense:  60 tablet    Refill:  5    Not to exceed 4 additional fills before 10/30/2017    Medications Discontinued During This Encounter  Medication Reason  . ALPRAZolam (XANAX) 0.25 MG tablet     Follow-up: Return in about 6 months (around 04/09/2018) for follow up diabetes.   Crecencio Mc, MD

## 2017-10-08 NOTE — Patient Instructions (Addendum)
Reduce evening  dose of glipizide to 5 mg IF YOU ARE EATING DINNER ,  TAKE IT WITH DINNER   TAKE NO GLIPIZIDE AT ALL IN THE EVENING IF YOU DO NOT EAT DINNER    BLOOD WORK IS FINE EXCEPT FOR AN LEVATED LIVER ENZYME,  WE WILL REPEAT MAY 10,  AND IF STILL UP,  WE WILL ORDER AN ULTRASOUND OF LIVER    IF YOUR HEADACHES BECOME MORE PERSISTNET ,  WE WILL  REPEAT THE MRI OF BRAIN

## 2017-10-10 DIAGNOSIS — K760 Fatty (change of) liver, not elsewhere classified: Secondary | ICD-10-CM | POA: Insufficient documentation

## 2017-10-10 DIAGNOSIS — R748 Abnormal levels of other serum enzymes: Secondary | ICD-10-CM | POA: Insufficient documentation

## 2017-10-10 NOTE — Assessment & Plan Note (Addendum)
Etiology unclear.  Will repeat with TSH and begin diagnostic workup if still elevated.

## 2017-10-10 NOTE — Assessment & Plan Note (Signed)
Left parietal, lasting < 1 hours and no vision changes. Will follow,  MRI if severity increases

## 2017-10-10 NOTE — Assessment & Plan Note (Addendum)
Improved control  Due to weight loss. Hypoglycemia occurring  secondary to patient's poor appetite and recurrent N/V.   Reducing evening dose of glipizide  Patient has no microalbuminuria. Patient is tolerating statin therapy for CAD risk reduction and on ACE/ARB for renal protection and hypertension   Lab Results  Component Value Date   HGBA1C 7.0 (H) 10/05/2017   . Lab Results  Component Value Date   MICROALBUR 1.6 07/07/2017

## 2017-10-10 NOTE — Assessment & Plan Note (Signed)
Wit persistent radiculopathy at L4-5 and at S1 noted on recent EMG/[place]  Studies.  Has follow up with [facility] May 7

## 2017-10-12 ENCOUNTER — Encounter: Payer: Self-pay | Admitting: Internal Medicine

## 2017-10-14 DIAGNOSIS — D2261 Melanocytic nevi of right upper limb, including shoulder: Secondary | ICD-10-CM | POA: Diagnosis not present

## 2017-10-14 DIAGNOSIS — L821 Other seborrheic keratosis: Secondary | ICD-10-CM | POA: Diagnosis not present

## 2017-10-14 DIAGNOSIS — D2262 Melanocytic nevi of left upper limb, including shoulder: Secondary | ICD-10-CM | POA: Diagnosis not present

## 2017-10-14 DIAGNOSIS — D2271 Melanocytic nevi of right lower limb, including hip: Secondary | ICD-10-CM | POA: Diagnosis not present

## 2017-10-14 DIAGNOSIS — D2272 Melanocytic nevi of left lower limb, including hip: Secondary | ICD-10-CM | POA: Diagnosis not present

## 2017-10-14 DIAGNOSIS — D225 Melanocytic nevi of trunk: Secondary | ICD-10-CM | POA: Diagnosis not present

## 2017-10-19 LAB — HM DIABETES EYE EXAM

## 2017-10-21 ENCOUNTER — Encounter: Payer: Self-pay | Admitting: Internal Medicine

## 2017-10-25 ENCOUNTER — Ambulatory Visit: Payer: Medicare HMO | Admitting: Gastroenterology

## 2017-10-25 ENCOUNTER — Encounter: Payer: Self-pay | Admitting: Gastroenterology

## 2017-10-25 ENCOUNTER — Other Ambulatory Visit: Payer: Self-pay

## 2017-10-25 VITALS — BP 97/60 | HR 74 | Temp 98.0°F | Ht 64.0 in | Wt 149.0 lb

## 2017-10-25 DIAGNOSIS — F458 Other somatoform disorders: Secondary | ICD-10-CM

## 2017-10-25 DIAGNOSIS — R0989 Other specified symptoms and signs involving the circulatory and respiratory systems: Secondary | ICD-10-CM

## 2017-10-25 DIAGNOSIS — K760 Fatty (change of) liver, not elsewhere classified: Secondary | ICD-10-CM

## 2017-10-25 DIAGNOSIS — R198 Other specified symptoms and signs involving the digestive system and abdomen: Secondary | ICD-10-CM

## 2017-10-25 DIAGNOSIS — R69 Illness, unspecified: Secondary | ICD-10-CM | POA: Diagnosis not present

## 2017-10-25 NOTE — Progress Notes (Signed)
Maria Antigua, MD [address]  [place]  [place], [place] [address]  Main: [phone]  Fax: [phone]   Primary Care Physician: Crecencio Mc, MD  Primary Gastroenterologist:  Dr. Vonda Hinton  Chief Complaint  Patient presents with  . Follow-up    3 month f/u-dysphagia. Dx: hyperthyroid (auto immune-Graves)    HPI: Maria Hinton is a 72 y.o. female here for follow-up of dysphagia.  Patient had an upper GI series that was normal.  EGD done for dysphagia, showed esophageal mucosal changes suggestive of eosinophilic esophagitis.  Z line at 39 cm.  Salmon-colored mucosa suspicious for Barrett's.  Erythematous antrum.  Few gastric polyps.  Small hiatal hernia. Biopsies showed no eosinophils in the distal or proximal esophagus.  Reflux gastroesophagitis at the GE junction.  Stomach polyp was fundic gland polyp.  No H. pylori, and mild chronic gastritis was noted.  Today, patient denies any true dysphagia.  She states pills, liquids, solids are able to be swallowed and go down into her stomach without difficulty.  However, when she swallows them, she describes noting a globus sensation at the time of swallowing.  She never has to bring food back up.  No episodes of food impaction.  She has been diagnosed with Graves' disease, and her methimazole has been titrated recently.  This has helped her symptoms, weakness, palpitations, weight loss significantly.  No altered bowel habits.  No blood in stool.  No family history of colon cancer.  Last colonoscopy in 2016 by Dr. Candace Cruise, no polyps reported, repeat recommended in 5 years.  Current Outpatient Medications  Medication Sig Dispense Refill  . albuterol (PROVENTIL HFA;VENTOLIN HFA) 108 (90 Base) MCG/ACT inhaler Inhale 2 puffs into the lungs every 6 (six) hours as needed for wheezing. 6.7 g 11  . ALPRAZolam (XANAX) 0.25 MG tablet Take 1 tablet (0.25 mg total) by mouth 2 (two) times daily as needed. 60 tablet 5  . aspirin  EC 81 MG tablet Take 81 mg by mouth daily.    Marland Kitchen atenolol (TENORMIN) 50 MG tablet TAKE 1 TABLET (50 MG TOTAL) BY MOUTH 2 (TWO) TIMES DAILY. 180 tablet 3  . atorvastatin (LIPITOR) 40 MG tablet TAKE 1 TABLET BY MOUTH DAILY AT 6 PM. 90 tablet 3  . BD PEN NEEDLE NANO U/F 32G X 4 MM MISC USE DAILY AS DIRECTED 100 each 1  . Blood Glucose Monitoring Suppl (ONE TOUCH ULTRA SYSTEM KIT) W/DEVICE KIT Pt needs OneTouch Ultra Blue glucometer dx 250.02 1 each 0  . glipiZIDE (GLUCOTROL) 10 MG tablet TAKE 1 TABLET BY MOUTH 2 TIMES DAILY BEFORE A MEAL. 180 tablet 3  . glucose blood (ONE TOUCH ULTRA TEST) test strip Use as instructed bid, has OneTouch Ultra Blue  dx: 250.02 100 each 3  . hydrochlorothiazide (MICROZIDE) 12.5 MG capsule Take 12.5 mg by mouth daily.    . Insulin Detemir (LEVEMIR FLEXPEN) 100 UNIT/ML Pen Inject 20 Units into the skin daily at 10 pm. 15 mL 11  . Lancets (ONETOUCH ULTRASOFT) lancets Use as instructed bid dx 250.02 100 each 3  . lisinopril (PRINIVIL,ZESTRIL) 10 MG tablet TAKE 1 TABLET BY MOUTH DAILY 90 tablet 1  . metFORMIN (GLUCOPHAGE) 1000 MG tablet TAKE 1 TABLET BY MOUTH 2 TIMES DAILY WITH A MEAL. 180 tablet 3  . methimazole (TAPAZOLE) 10 MG tablet Take 1 tablet (10 mg total) by mouth 3 (three) times daily. 90 tablet 1  . omeprazole (PRILOSEC) 40 MG capsule TAKE 1 CAPSULE BY MOUTH  EVERY DAY 90 capsule 0  . traMADol (ULTRAM) 50 MG tablet Take 1 tablet (50 mg total) by mouth every 6 (six) hours as needed. FOR SPINAL STENOSIS PAIN 120 tablet 5   No current facility-administered medications for this visit.     Allergies as of 10/25/2017 - Review Complete 10/25/2017  Allergen Reaction Noted  . Vicodin [hydrocodone-acetaminophen] Other (See Comments) 09/08/2011    ROS:  General: Negative for anorexia, weight loss, fever, chills, fatigue, weakness. ENT: Negative for hoarseness, difficulty swallowing , nasal congestion. CV: Negative for chest pain, angina, palpitations, dyspnea on  exertion, peripheral edema.  Respiratory: Negative for dyspnea at rest, dyspnea on exertion, cough, sputum, wheezing.  GI: See history of present illness. GU:  Negative for dysuria, hematuria, urinary incontinence, urinary frequency, nocturnal urination.  Endo: Negative for unusual weight change.    Physical Examination:   BP 97/60   Pulse 74   Temp 98 F (36.7 C) (Oral)   Ht 5' 4"  (1.626 m)   Wt 149 lb (67.6 kg)   BMI 25.58 kg/m   General: Well-nourished, well-developed in no acute distress.  Eyes: No icterus. Conjunctivae pink. Mouth: Oropharyngeal mucosa moist and pink , no lesions erythema or exudate. Neck: Supple, Trachea midline Abdomen: Bowel sounds are normal, nontender, nondistended, no hepatosplenomegaly or masses, no abdominal bruits or hernia , no rebound or guarding.   Extremities: No lower extremity edema. No clubbing or deformities. Neuro: Alert and oriented x 3.  Grossly intact. Skin: Warm and dry, no jaundice.   Psych: Alert and cooperative, normal mood and affect.   Labs: CMP     Component Value Date/Time   NA 137 10/05/2017 0859   NA 141 11/30/2012 0545   K 3.8 10/05/2017 0859   K 4.1 11/30/2012 0545   CL 100 10/05/2017 0859   CL 108 (H) 11/30/2012 0545   CO2 30 10/05/2017 0859   CO2 26 11/30/2012 0545   GLUCOSE 102 (H) 10/05/2017 0859   GLUCOSE 191 (H) 11/30/2012 0545   BUN 24 (H) 10/05/2017 0859   BUN 22 (H) 11/30/2012 0545   CREATININE 0.89 10/05/2017 0859   CREATININE 1.10 11/30/2012 0545   CALCIUM 8.6 10/05/2017 0859   CALCIUM 7.9 (L) 11/30/2012 0545   PROT 6.2 10/05/2017 0859   PROT 6.0 (L) 04/22/2012 0701   ALBUMIN 3.3 (L) 10/05/2017 0859   ALBUMIN 1.9 (L) 04/22/2012 0701   AST 17 10/05/2017 0859   AST 11 (L) 04/22/2012 0701   ALT 17 10/05/2017 0859   ALT 13 04/22/2012 0701   ALKPHOS 121 (H) 10/05/2017 0859   ALKPHOS 129 04/22/2012 0701   BILITOT 0.4 10/05/2017 0859   BILITOT 0.4 04/22/2012 0701   GFRNONAA 51 (L) 06/20/2015 1118    GFRNONAA 53 (L) 11/30/2012 0545   GFRAA 59 (L) 06/20/2015 1118   GFRAA >60 11/30/2012 0545   Lab Results  Component Value Date   WBC 7.1 05/24/2017   HGB 13.3 05/24/2017   HCT 40.2 05/24/2017   MCV 90.0 05/24/2017   PLT 272.0 05/24/2017    Imaging Studies: No results found.  Assessment and Plan:   BRIANE BIRDEN is a 72 y.o. y/o female care for follow-up of dysphagia  At this time, patient denies any true dysphagia, her symptoms are more related to globus sensation at the time of swallowing She is on Prilosec by her primary care physician, and this is controlling her heartburn well, and is likely to help with her globus sensation as well Since she  is not having any true dysphagia, and is currently undergoing management of Graves' disease, which is helping her weight loss and other symptoms overall, no indication for further testing at this time If symptoms worsen, patient was asked to contact us immediately.  At that time modified barium swallow or manometry can be considered  Otherwise, we will follow-up in clinic in 4 to 6 months Patient was asked to call us if symptoms recur in the meantime Patient educated extensively on acid reflux lifestyle modification, including buying a bed wedge, not eating 3 hrs before bedtime, diet modifications, and handout given for the same.   Next colonoscopy due in 2021 unless new symptoms occur.  No new symptoms present at this time  Fatty liver was discussed with her in detail Weight loss and diet and exercise encouraged Her AST and ALT are now normal and were likely elevated due to her hyperthyroidism and Graves' disease Her alk phos is mildly elevated, primary care provider is repeating this Mild elevation in alk phos is likely due to thyroid disease versus new changes in medications If repeat testing with primary care provider, shows further elevation in alk phos, primary care provider is planning on right upper quadrant ultrasound, which is  appropriate. CT scan previously showed fatty liver, and no gallbladder or bile duct disease Dr. Derrel Nip, can notify us if needed, if liver enzymes show further elevation after her disease is controlled  We will continue to follow-up in clinic otherwise   Dr Maria Hinton

## 2017-10-29 ENCOUNTER — Other Ambulatory Visit: Payer: Self-pay | Admitting: Endocrinology

## 2017-11-09 DIAGNOSIS — M7138 Other bursal cyst, other site: Secondary | ICD-10-CM | POA: Diagnosis not present

## 2017-11-09 DIAGNOSIS — M48062 Spinal stenosis, lumbar region with neurogenic claudication: Secondary | ICD-10-CM | POA: Diagnosis not present

## 2017-11-09 DIAGNOSIS — M4726 Other spondylosis with radiculopathy, lumbar region: Secondary | ICD-10-CM | POA: Diagnosis not present

## 2017-11-09 DIAGNOSIS — M5136 Other intervertebral disc degeneration, lumbar region: Secondary | ICD-10-CM | POA: Diagnosis not present

## 2017-11-09 DIAGNOSIS — Z9889 Other specified postprocedural states: Secondary | ICD-10-CM | POA: Diagnosis not present

## 2017-11-09 DIAGNOSIS — I10 Essential (primary) hypertension: Secondary | ICD-10-CM | POA: Diagnosis not present

## 2017-11-09 DIAGNOSIS — M4155 Other secondary scoliosis, thoracolumbar region: Secondary | ICD-10-CM | POA: Diagnosis not present

## 2017-11-09 DIAGNOSIS — Z6825 Body mass index (BMI) 25.0-25.9, adult: Secondary | ICD-10-CM | POA: Diagnosis not present

## 2017-11-11 ENCOUNTER — Other Ambulatory Visit (INDEPENDENT_AMBULATORY_CARE_PROVIDER_SITE_OTHER): Payer: Medicare HMO

## 2017-11-11 DIAGNOSIS — R748 Abnormal levels of other serum enzymes: Secondary | ICD-10-CM | POA: Diagnosis not present

## 2017-11-11 LAB — COMPREHENSIVE METABOLIC PANEL
ALT: 15 U/L (ref 0–35)
AST: 20 U/L (ref 0–37)
Albumin: 3.8 g/dL (ref 3.5–5.2)
Alkaline Phosphatase: 153 U/L — ABNORMAL HIGH (ref 39–117)
BUN: 11 mg/dL (ref 6–23)
CO2: 31 mEq/L (ref 19–32)
Calcium: 8.3 mg/dL — ABNORMAL LOW (ref 8.4–10.5)
Chloride: 104 mEq/L (ref 96–112)
Creatinine, Ser: 0.93 mg/dL (ref 0.40–1.20)
GFR: 63.39 mL/min (ref >60.00)
Glucose, Bld: 82 mg/dL (ref 70–99)
Potassium: 4.6 mEq/L (ref 3.5–5.1)
Sodium: 142 mEq/L (ref 135–145)
Total Bilirubin: 0.4 mg/dL (ref 0.2–1.2)
Total Protein: 6.6 g/dL (ref 6.0–8.3)

## 2017-11-12 ENCOUNTER — Other Ambulatory Visit: Payer: Self-pay | Admitting: Internal Medicine

## 2017-11-12 DIAGNOSIS — R634 Abnormal weight loss: Secondary | ICD-10-CM

## 2017-11-12 DIAGNOSIS — R748 Abnormal levels of other serum enzymes: Secondary | ICD-10-CM | POA: Insufficient documentation

## 2017-11-12 DIAGNOSIS — R1115 Cyclical vomiting syndrome unrelated to migraine: Secondary | ICD-10-CM

## 2017-11-12 NOTE — Assessment & Plan Note (Signed)
In the setting of unintentional weight loss , recurrent N/V.  [place] ordered

## 2017-11-15 ENCOUNTER — Ambulatory Visit: Payer: Medicare HMO | Admitting: Endocrinology

## 2017-11-16 ENCOUNTER — Encounter (INDEPENDENT_AMBULATORY_CARE_PROVIDER_SITE_OTHER): Payer: Self-pay

## 2017-11-18 ENCOUNTER — Encounter (INDEPENDENT_AMBULATORY_CARE_PROVIDER_SITE_OTHER): Payer: Self-pay

## 2017-11-18 ENCOUNTER — Ambulatory Visit
Admission: RE | Admit: 2017-11-18 | Discharge: 2017-11-18 | Disposition: A | Discharge status: Home / Self Care | Payer: Medicare HMO | Source: Ambulatory Visit | Attending: Internal Medicine | Admitting: Internal Medicine

## 2017-11-18 ENCOUNTER — Ambulatory Visit: Payer: Medicare HMO | Admitting: Endocrinology

## 2017-11-18 ENCOUNTER — Encounter: Payer: Self-pay | Admitting: Endocrinology

## 2017-11-18 VITALS — BP 140/68 | HR 69 | Ht 64.0 in | Wt 152.2 lb

## 2017-11-18 DIAGNOSIS — R7989 Other specified abnormal findings of blood chemistry: Secondary | ICD-10-CM | POA: Diagnosis not present

## 2017-11-18 DIAGNOSIS — E0590 Thyrotoxicosis, unspecified without thyrotoxic crisis or storm: Secondary | ICD-10-CM

## 2017-11-18 DIAGNOSIS — G43A0 Cyclical vomiting, not intractable: Secondary | ICD-10-CM | POA: Diagnosis not present

## 2017-11-18 DIAGNOSIS — R748 Abnormal levels of other serum enzymes: Secondary | ICD-10-CM | POA: Insufficient documentation

## 2017-11-18 DIAGNOSIS — R634 Abnormal weight loss: Secondary | ICD-10-CM | POA: Insufficient documentation

## 2017-11-18 DIAGNOSIS — Z6826 Body mass index (BMI) 26.0-26.9, adult: Secondary | ICD-10-CM | POA: Diagnosis not present

## 2017-11-18 DIAGNOSIS — R1115 Cyclical vomiting syndrome unrelated to migraine: Secondary | ICD-10-CM

## 2017-11-18 LAB — T3, FREE: T3, Free: 3 pg/mL (ref 2.3–4.2)

## 2017-11-18 LAB — T4, FREE: Free T4: 0.63 ng/dL (ref 0.60–1.60)

## 2017-11-18 NOTE — Progress Notes (Signed)
Patient ID: Maria Hinton, female   DOB: [birth year], 72 y.o.   MRN: [id number]                                                                                                              Reason for Appointment:  Hyperthyroidism, follow-up visit   Referring physician: Derrel Nip   History of Present Illness:   Prior history:  For several months before her consultation she had symptoms of weakness, palpitations, shakiness, feeling sweaty in am, nervousness, and fatigue.  Also would feel a strong heartbeat in her ears She had been on atenolol for hypertension also She has had a decreased appetite and started losing weight late last summer  The patient  lost about  26 lbs since these symptoms started   RECENT history:  She is taking methimazole since 3/19 for her hyperthyroidism On her last visit she was taking 10 mg twice daily to treat this but since her thyroid levels were still not controlled she is now taking 30 mg a day with 2 tablets at night  Her complaints previously were that her appetite is decreased and had lost weight However weight has improved She is not complaining of any unusual fatigue no Also no shakiness or palpitations now  She continues to take atenolol 50 mg which she has been taking for several years for hypertension  Wt Readings from Last 3 Encounters:  11/18/17 152 lb 3.2 oz (69 kg)  10/25/17 149 lb (67.6 kg)  10/08/17 144 lb 12.8 oz (65.7 kg)     Thyroid levels are pending  Thyroid function tests as follows:     Lab Results  Component Value Date   FREET4 2.10 (H) 10/05/2017   FREET4 4.15 (H) 05/24/2017   T3FREE 4.9 (H) 10/05/2017   TSH <0.006 (L) 08/26/2017   TSH <0.01 Repeated and verified X2. (L) 05/24/2017   TSH 1.31 11/28/2013    Lab Results  Component Value Date   THYROTRECAB 3.76 (H) 09/07/2017     Allergies as of 11/18/2017      Reactions   Vicodin [hydrocodone-acetaminophen] Other (See Comments)   Insomnia       Medication List          Accurate as of 11/18/17  1:53 PM. Always use your most recent med list.          albuterol 108 (90 Base) MCG/ACT inhaler Commonly known as:  PROVENTIL HFA;VENTOLIN HFA Inhale 2 puffs into the lungs every 6 (six) hours as needed for wheezing.   ALPRAZolam 0.25 MG tablet Commonly known as:  XANAX Take 1 tablet (0.25 mg total) by mouth 2 (two) times daily as needed.   aspirin EC 81 MG tablet Take 81 mg by mouth daily.   atenolol 50 MG tablet Commonly known as:  TENORMIN TAKE 1 TABLET (50 MG TOTAL) BY MOUTH 2 (TWO) TIMES DAILY.   atorvastatin 40 MG tablet Commonly known as:  LIPITOR TAKE 1 TABLET BY MOUTH DAILY AT 6 PM.   BD PEN NEEDLE NANO U/F 32G  X 4 MM Misc Generic drug:  Insulin Pen Needle USE DAILY AS DIRECTED   glipiZIDE 10 MG tablet Commonly known as:  GLUCOTROL TAKE 1 TABLET BY MOUTH 2 TIMES DAILY BEFORE A MEAL.   hydrochlorothiazide 12.5 MG capsule Commonly known as:  MICROZIDE Take 12.5 mg by mouth daily.   Insulin Detemir 100 UNIT/ML Pen Commonly known as:  LEVEMIR FLEXPEN Inject 20 Units into the skin daily at 10 pm.   lisinopril 10 MG tablet Commonly known as:  PRINIVIL,ZESTRIL TAKE 1 TABLET BY MOUTH DAILY   metFORMIN 1000 MG tablet Commonly known as:  GLUCOPHAGE TAKE 1 TABLET BY MOUTH 2 TIMES DAILY WITH A MEAL.   methimazole 10 MG tablet Commonly known as:  TAPAZOLE Take 1 tablet (10 mg total) by mouth 3 (three) times daily.   omeprazole 40 MG capsule Commonly known as:  PRILOSEC TAKE 1 CAPSULE BY MOUTH EVERY DAY   traMADol 50 MG tablet Commonly known as:  ULTRAM Take 1 tablet (50 mg total) by mouth every 6 (six) hours as needed. FOR SPINAL STENOSIS PAIN           Past Medical History:  Diagnosis Date  . Actinic keratoses    follows with dermatology   . Arthritis   . Asthma   . Chronic back pain    DDD,herniated disc/spondylosis/radiculopathy  . COPD (chronic obstructive pulmonary disease) ([place])   . Diabetes mellitus    1980s   . Early cataracts, bilateral    Dr. Ruthine Dose - [facility]  . GERD (gastroesophageal reflux disease)    takes Omeprazole daily  . Hemorrhoids   . History of colonic polyps    colonoscopy 04/2012 - Dr Candace Cruise  . Hyperlipidemia    takes Pravastatin daily  . Hypertension    takes Metoprolol/HCTZ daily  . Insomnia   . Joint pain    fingers  . Joint swelling   . Pneumonia    x 2 ;in Dec 2010/2011  . PONV (postoperative nausea and vomiting)   . Right leg weakness     Past Surgical History:  Procedure Laterality Date  . ABDOMINAL HYSTERECTOMY     at age 35  . BACK SURGERY  20+yrs ago  . COLONOSCOPY    . COLONOSCOPY N/A 05/13/2015   Procedure: COLONOSCOPY;  Surgeon: Hulen Luster, MD;  Location: [facility] END[facility]COPY;  Service: Gastroenterology;  Laterality: N/A;  . ESOPHAGOGASTRODUODENOSCOPY  2011  . ESOPHAGOGASTRODUODENOSCOPY (EGD) WITH PROPOFOL N/A 09/02/2017   Procedure: ESOPHAGOGASTRODUODENOSCOPY (EGD) WITH PROPOFOL;  Surgeon: Virgel Manifold, MD;  Location: [facility] ENDOSCOPY;  Service: Endoscopy;  Laterality: N/A;  . FOOT SURGERY  2011   bunionectomy and knot removed from bottom of both feet  . JOINT REPLACEMENT Left 2014   hip  Kraskinski  . LUMBAR LAMINECTOMY/DECOMPRESSION MICRODISCECTOMY  09/10/2011   Procedure: LUMBAR LAMINECTOMY/DECOMPRESSION MICRODISCECTOMY 1 LEVEL;  Surgeon: Hosie Spangle, MD;  Location: [facility];  Service: Neurosurgery;  Laterality: Right;  RIGHT Lumbar  Laminotomy and microdiskectomy Lumbar Four-Five  . LUMBAR LAMINECTOMY/DECOMPRESSION MICRODISCECTOMY N/A 06/26/2015   Procedure: LUMBAR LAMINECTOMY/DECOMPRESSION MICRODISCECTOMY 1 LEVEL;  Surgeon: Jovita Gamma, MD;  Location: [place] [facility];  Service: Neurosurgery;  Laterality: N/A;  L3 and L4 Laminectomies  . MELANOMA EXCISION  2011   on nose/back on neck  . THORACIC DISC SURGERY  at age 27 and 68    mass of veins that had gotten into muscle and wrapped around rib;3 ribs also removed from left side   . TONSILLECTOMY  as a child   and adenoids   . TOTAL HIP ARTHROPLASTY Left 2014   Dr. Mack Guise  . TUBAL LIGATION    . VAGINA SURGERY     vaginal wall ruptured     Family History  Problem Relation Age of Onset  . Heart disease Brother        valve replacement  . COPD Brother   . COPD Mother        Emphysema  . Diabetes Mother   . Other Mother        Died in house fire at age 74.  Marland Kitchen Heart disease Father   . Seizures Father   . Cancer Father        Lung cancer with brain metastasis  . Asthma Brother   . COPD Son 24  . Cancer Other        aunt ? maternal vs paternal side lung cancer   . Diabetes Maternal Grandmother   . Anesthesia problems Neg Hx   . Hypotension Neg Hx   . Malignant hyperthermia Neg Hx   . Pseudochol deficiency Neg Hx   . Thyroid disease Neg Hx     Social History:  reports that she quit smoking about 16 months ago. Her smoking use included cigarettes. She has a 5.00 pack-year smoking history. She has never used smokeless tobacco. She reports that she does not drink alcohol or use drugs.  Allergies:  Allergies  Allergen Reactions  . Vicodin [Hydrocodone-Acetaminophen] Other (See Comments)    Insomnia      Review of Systems   She has a little dizzy feeling and drowsiness in the morning but she is taking 2 tablets of Xanax at bedtime    Examination:   BP 140/68 (BP Location: Left Arm, Patient Position: Sitting, Cuff Size: Normal)   Pulse 69   Ht 5\' 4"  (1.626 m)   Wt 152 lb 3.2 oz (69 kg)   SpO2 98%   BMI 26.13 kg/m   Thyroid not palpable No tremors. Biceps reflexes appear normal   Assessment/Plan:   Hyperthyroidism, diagnosed to be from Graves' disease   She has been feeling fairly good since her last visit and has no further weight loss or fatigue  Taking 30 mg of methimazole currently subjectively feeling better with starting methimazole 10 mg twice daily  Objectively looks euthyroid and there is no thyroid enlargement Will need  to check her thyroid levels and decide on further treatment Also check thyrotropin receptor antibody Discussed that if the antibody test is still relatively high and not improving and we are not able to taper her methimazole down within the next 2 or 3 months may consider I-131 treatment  Follow-up in about 6 weeks     Elayne Snare 11/18/2017, 1:53 PM    Note: This office note was prepared with Dragon voice recognition system technology. Any transcriptional errors that result from this process are unintentional.

## 2017-11-19 LAB — THYROTROPIN RECEPTOR AUTOABS: Thyrotropin Receptor Ab: 3.58 IU/L — ABNORMAL HIGH (ref 0.00–1.75)

## 2017-11-26 ENCOUNTER — Other Ambulatory Visit: Payer: Self-pay | Admitting: Endocrinology

## 2017-12-22 ENCOUNTER — Other Ambulatory Visit: Payer: Self-pay | Admitting: Endocrinology

## 2017-12-30 ENCOUNTER — Encounter: Payer: Self-pay | Admitting: Endocrinology

## 2018-01-05 ENCOUNTER — Other Ambulatory Visit (INDEPENDENT_AMBULATORY_CARE_PROVIDER_SITE_OTHER): Payer: Medicare HMO

## 2018-01-05 DIAGNOSIS — E0590 Thyrotoxicosis, unspecified without thyrotoxic crisis or storm: Secondary | ICD-10-CM | POA: Diagnosis not present

## 2018-01-05 LAB — TSH: TSH: 10.53 u[IU]/mL — ABNORMAL HIGH (ref 0.35–4.50)

## 2018-01-05 LAB — T4, FREE: Free T4: 0.57 ng/dL — ABNORMAL LOW (ref 0.60–1.60)

## 2018-01-05 LAB — T3, FREE: T3, Free: 2.5 pg/mL (ref 2.3–4.2)

## 2018-01-12 ENCOUNTER — Encounter: Payer: Self-pay | Admitting: Endocrinology

## 2018-01-12 ENCOUNTER — Ambulatory Visit: Payer: Medicare HMO | Admitting: Endocrinology

## 2018-01-12 VITALS — BP 130/80 | HR 60 | Ht 64.0 in | Wt 149.4 lb

## 2018-01-12 DIAGNOSIS — E0590 Thyrotoxicosis, unspecified without thyrotoxic crisis or storm: Secondary | ICD-10-CM

## 2018-01-12 NOTE — Patient Instructions (Signed)
Take only 1/2 tab daily

## 2018-01-12 NOTE — Progress Notes (Signed)
Patient ID: Maria Hinton, female   DOB: February 25, 1946, 72 y.o.   MRN: [id number]                                                                                                              Reason for Appointment:  Hyperthyroidism, follow-up visit   Referring physician: Derrel Nip   History of Present Illness:   Prior history:  For several months before her consultation she had symptoms of weakness, palpitations, shakiness, feeling sweaty in am, nervousness, and fatigue.  Also would feel a strong heartbeat in her ears She had been on atenolol for hypertension also She has had a decreased appetite and started losing weight late last summer  The patient  lost about  26 lbs since these symptoms started   RECENT history:  She is taking methimazole since 3/19 for her hyperthyroidism On her 10/2017 visit she was taking 10 mg twice daily because of hypothyroidism she was given 30 mg daily  In follow-up in 5/19 although she was feeling fairly well her free T4 was low normal She is back on 20 mg a day of methimazole in divided doses She does think that she is feeling more cold and also tired now at times No change in weight, this is despite her eating more complete meals  She continues to take atenolol 50 mg which she has been taking for several years for hypertension  Wt Readings from Last 3 Encounters:  01/12/18 149 lb 6.4 oz (67.8 kg)  11/18/17 152 lb 3.2 oz (69 kg)  10/25/17 149 lb (67.6 kg)     Thyroid levels are now showing hypothyroidism with TSH 10.5 and low free T4  Thyroid function tests as follows:     Lab Results  Component Value Date   FREET4 0.57 (L) 01/05/2018   FREET4 0.63 11/18/2017   FREET4 2.10 (H) 10/05/2017   T3FREE 2.5 01/05/2018   T3FREE 3.0 11/18/2017   T3FREE 4.9 (H) 10/05/2017   TSH 10.53 (H) 01/05/2018   TSH <0.006 (L) 08/26/2017   TSH <0.01 Repeated and verified X2. (L) 05/24/2017    Lab Results  Component Value Date   THYROTRECAB 3.58 (H) 11/18/2017     THYROTRECAB 3.76 (H) 09/07/2017     Allergies as of 01/12/2018      Reactions   Vicodin [hydrocodone-acetaminophen] Other (See Comments)   Insomnia       Medication List        Accurate as of 01/12/18 12:55 PM. Always use your most recent med list.          albuterol 108 (90 Base) MCG/ACT inhaler Commonly known as:  PROVENTIL HFA;VENTOLIN HFA Inhale 2 puffs into the lungs every 6 (six) hours as needed for wheezing.   ALPRAZolam 0.25 MG tablet Commonly known as:  XANAX Take 1 tablet (0.25 mg total) by mouth 2 (two) times daily as needed.   aspirin EC 81 MG tablet Take 81 mg by mouth daily.   atenolol 50 MG tablet Commonly known as:  TENORMIN TAKE 1  TABLET (50 MG TOTAL) BY MOUTH 2 (TWO) TIMES DAILY.   atorvastatin 40 MG tablet Commonly known as:  LIPITOR TAKE 1 TABLET BY MOUTH DAILY AT 6 PM.   BD PEN NEEDLE NANO U/F 32G X 4 MM Misc Generic drug:  Insulin Pen Needle USE DAILY AS DIRECTED   glipiZIDE 10 MG tablet Commonly known as:  GLUCOTROL TAKE 1 TABLET BY MOUTH 2 TIMES DAILY BEFORE A MEAL.   hydrochlorothiazide 12.5 MG capsule Commonly known as:  MICROZIDE Take 12.5 mg by mouth daily.   Insulin Detemir 100 UNIT/ML Pen Commonly known as:  LEVEMIR FLEXPEN Inject 20 Units into the skin daily at 10 pm.   lisinopril 10 MG tablet Commonly known as:  PRINIVIL,ZESTRIL TAKE 1 TABLET BY MOUTH DAILY   metFORMIN 1000 MG tablet Commonly known as:  GLUCOPHAGE TAKE 1 TABLET BY MOUTH 2 TIMES DAILY WITH A MEAL.   methimazole 10 MG tablet Commonly known as:  TAPAZOLE TAKE 1 TABLET BY MOUTH THREE TIMES A DAY   omeprazole 40 MG capsule Commonly known as:  PRILOSEC TAKE 1 CAPSULE BY MOUTH EVERY DAY   traMADol 50 MG tablet Commonly known as:  ULTRAM Take 1 tablet (50 mg total) by mouth every 6 (six) hours as needed. FOR SPINAL STENOSIS PAIN           Past Medical History:  Diagnosis Date  . Actinic keratoses    follows with dermatology   . Arthritis   .  Asthma   . Chronic back pain    DDD,herniated disc/spondylosis/radiculopathy  . COPD (chronic obstructive pulmonary disease) ([place])   . Diabetes mellitus    1980s  . Early cataracts, bilateral    Dr. Ruthine Dose - [facility]  . GERD (gastroesophageal reflux disease)    takes Omeprazole daily  . Hemorrhoids   . History of colonic polyps    colonoscopy 04/2012 - Dr Candace Cruise  . Hyperlipidemia    takes Pravastatin daily  . Hypertension    takes Metoprolol/HCTZ daily  . Insomnia   . Joint pain    fingers  . Joint swelling   . Pneumonia    x 2 ;in Dec 2010/2011  . PONV (postoperative nausea and vomiting)   . Right leg weakness     Past Surgical History:  Procedure Laterality Date  . ABDOMINAL HYSTERECTOMY     at age 69  . BACK SURGERY  20+yrs ago  . COLONOSCOPY    . COLONOSCOPY N/A 05/13/2015   Procedure: COLONOSCOPY;  Surgeon: Hulen Luster, MD;  Location: [facility] ENDOSCOPY;  Service: Gastroenterology;  Laterality: N/A;  . ESOPHAGOGASTRODUODENOSCOPY  2011  . ESOPHAGOGASTRODUODENOSCOPY (EGD) WITH PROPOFOL N/A 09/02/2017   Procedure: ESOPHAGOGASTRODUODENOSCOPY (EGD) WITH PROPOFOL;  Surgeon: Virgel Manifold, MD;  Location: [facility] ENDOSCOPY;  Service: Endoscopy;  Laterality: N/A;  . FOOT SURGERY  2011   bunionectomy and knot removed from bottom of both feet  . JOINT REPLACEMENT Left 2014   hip  Kraskinski  . LUMBAR LAMINECTOMY/DECOMPRESSION MICRODISCECTOMY  09/10/2011   Procedure: LUMBAR LAMINECTOMY/DECOMPRESSION MICRODISCECTOMY 1 LEVEL;  Surgeon: Hosie Spangle, MD;  Location: [place] NEURO ORS;  Service: Neurosurgery;  Laterality: Right;  RIGHT Lumbar  Laminotomy and microdiskectomy Lumbar Four-Five  . LUMBAR LAMINECTOMY/DECOMPRESSION MICRODISCECTOMY N/A 06/26/2015   Procedure: LUMBAR LAMINECTOMY/DECOMPRESSION MICRODISCECTOMY 1 LEVEL;  Surgeon: Jovita Gamma, MD;  Location: [place] NEURO ORS;  Service: Neurosurgery;  Laterality: N/A;  L3 and L4 Laminectomies  . MELANOMA EXCISION  2011    on nose/back on neck  .  THORACIC DISC SURGERY  at age 83 and 78    mass of veins that had gotten into muscle and wrapped around rib;3 ribs also removed from left side  . TONSILLECTOMY  as a child   and adenoids   . TOTAL HIP ARTHROPLASTY Left 2014   Dr. Mack Guise  . TUBAL LIGATION    . VAGINA SURGERY     vaginal wall ruptured     Family History  Problem Relation Age of Onset  . Heart disease Brother        valve replacement  . COPD Brother   . COPD Mother        Emphysema  . Diabetes Mother   . Other Mother        Died in house fire at age 77.  Marland Kitchen Heart disease Father   . Seizures Father   . Cancer Father        Lung cancer with brain metastasis  . Asthma Brother   . COPD Son 80  . Cancer Other        aunt ? maternal vs paternal side lung cancer   . Diabetes Maternal Grandmother   . Anesthesia problems Neg Hx   . Hypotension Neg Hx   . Malignant hyperthermia Neg Hx   . Pseudochol deficiency Neg Hx   . Thyroid disease Neg Hx     Social History:  reports that she quit smoking about 18 months ago. Her smoking use included cigarettes. She has a 5.00 pack-year smoking history. She has never used smokeless tobacco. She reports that she does not drink alcohol or use drugs.  Allergies:  Allergies  Allergen Reactions  . Vicodin [Hydrocodone-Acetaminophen] Other (See Comments)    Insomnia      Review of Systems      Examination:   BP 130/80 (BP Location: Left Arm, Patient Position: Sitting, Cuff Size: Normal)   Pulse 60   Ht 5\' 4"  (1.626 m)   Wt 149 lb 6.4 oz (67.8 kg)   SpO2 96%   BMI 25.64 kg/m   Thyroid not palpable Skin appears normal Biceps reflexes show normal relaxation   Assessment/Plan:   Hyperthyroidism, diagnosed to be from Graves' disease, on treatment since 09/2017  Although she had not required as much as 30 mg of methimazole more recently she is needing progressively lower doses Currently with 20 mg a day of methimazole her thyroid levels are  low including high TSH and she has mild hypothyroid symptoms also  Most likely she may be able to get into remission soon with rapid improvement in her hyperthyroidism  She can now try taking only half a tablet of the 10 mg methimazole Discussed that if she has any recurrence of hyperthyroid symptoms she will let us know   Follow-up in about 5 weeks  HYPERTENSION: Well controlled with mild decrease in pulse rate, on atenolol  Elayne Snare 01/12/2018, 12:55 PM    Note: This office note was prepared with Dragon voice recognition system technology. Any transcriptional errors that result from this process are unintentional.

## 2018-02-12 ENCOUNTER — Other Ambulatory Visit: Payer: Self-pay | Admitting: Internal Medicine

## 2018-02-17 ENCOUNTER — Other Ambulatory Visit: Payer: Self-pay

## 2018-02-17 ENCOUNTER — Emergency Department
Admission: EM | Admit: 2018-02-17 | Discharge: 2018-02-17 | Disposition: A | Discharge status: Home / Self Care | Payer: Medicare HMO | Attending: Emergency Medicine | Admitting: Emergency Medicine

## 2018-02-17 ENCOUNTER — Emergency Department: Payer: Medicare HMO

## 2018-02-17 DIAGNOSIS — Z85820 Personal history of malignant melanoma of skin: Secondary | ICD-10-CM | POA: Insufficient documentation

## 2018-02-17 DIAGNOSIS — J45909 Unspecified asthma, uncomplicated: Secondary | ICD-10-CM | POA: Insufficient documentation

## 2018-02-17 DIAGNOSIS — E11649 Type 2 diabetes mellitus with hypoglycemia without coma: Secondary | ICD-10-CM | POA: Diagnosis not present

## 2018-02-17 DIAGNOSIS — R000 Tachycardia, unspecified: Secondary | ICD-10-CM | POA: Diagnosis not present

## 2018-02-17 DIAGNOSIS — F17210 Nicotine dependence, cigarettes, uncomplicated: Secondary | ICD-10-CM | POA: Diagnosis not present

## 2018-02-17 DIAGNOSIS — R69 Illness, unspecified: Secondary | ICD-10-CM | POA: Diagnosis not present

## 2018-02-17 DIAGNOSIS — E162 Hypoglycemia, unspecified: Secondary | ICD-10-CM | POA: Diagnosis not present

## 2018-02-17 DIAGNOSIS — I10 Essential (primary) hypertension: Secondary | ICD-10-CM | POA: Insufficient documentation

## 2018-02-17 DIAGNOSIS — Z96642 Presence of left artificial hip joint: Secondary | ICD-10-CM | POA: Insufficient documentation

## 2018-02-17 DIAGNOSIS — E119 Type 2 diabetes mellitus without complications: Secondary | ICD-10-CM | POA: Insufficient documentation

## 2018-02-17 DIAGNOSIS — R29818 Other symptoms and signs involving the nervous system: Secondary | ICD-10-CM

## 2018-02-17 DIAGNOSIS — R4781 Slurred speech: Secondary | ICD-10-CM | POA: Diagnosis not present

## 2018-02-17 LAB — COMPREHENSIVE METABOLIC PANEL
ALT: 14 U/L (ref 0–44)
AST: 25 U/L (ref 15–41)
Albumin: 4.3 g/dL (ref 3.5–5.0)
Alkaline Phosphatase: 149 U/L — ABNORMAL HIGH (ref 38–126)
Anion gap: 9 (ref 5–15)
BUN: 20 mg/dL (ref 8–23)
CO2: 28 mmol/L (ref 22–32)
Calcium: 8.7 mg/dL — ABNORMAL LOW (ref 8.9–10.3)
Chloride: 99 mmol/L (ref 98–111)
Creatinine, Ser: 1.09 mg/dL — ABNORMAL HIGH (ref 0.44–1.00)
GFR calc Af Amer: 58 mL/min — ABNORMAL LOW (ref >60)
GFR calc non Af Amer: 50 mL/min — ABNORMAL LOW (ref >60)
Glucose, Bld: 64 mg/dL — ABNORMAL LOW (ref 70–99)
Potassium: 4.6 mmol/L (ref 3.5–5.1)
Sodium: 136 mmol/L (ref 135–145)
Total Bilirubin: 0.8 mg/dL (ref 0.3–1.2)
Total Protein: 7.4 g/dL (ref 6.5–8.1)

## 2018-02-17 LAB — URINALYSIS, COMPLETE (UACMP) WITH MICROSCOPIC
Bacteria, UA: NONE SEEN
Bilirubin Urine: NEGATIVE
Glucose, UA: >=500 mg/dL — AB
Hgb urine dipstick: NEGATIVE
Ketones, ur: NEGATIVE mg/dL
Nitrite: NEGATIVE
Protein, ur: NEGATIVE mg/dL
Specific Gravity, Urine: 1.009 (ref 1.005–1.030)
pH: 5 (ref 5.0–8.0)

## 2018-02-17 LAB — CBC
HCT: 39.6 % (ref 35.0–47.0)
Hemoglobin: 13.8 g/dL (ref 12.0–16.0)
MCH: 31.8 pg (ref 26.0–34.0)
MCHC: 34.8 g/dL (ref 32.0–36.0)
MCV: 91.5 fL (ref 80.0–100.0)
Platelets: 300 10*3/uL (ref 150–440)
RBC: 4.32 MIL/uL (ref 3.80–5.20)
RDW: 14 % (ref 11.5–14.5)
WBC: 9.1 10*3/uL (ref 3.6–11.0)

## 2018-02-17 LAB — DIFFERENTIAL
Basophils Absolute: 0 10*3/uL (ref 0–0.1)
Basophils Relative: 0 %
Eosinophils Absolute: 0.2 10*3/uL (ref 0–0.7)
Eosinophils Relative: 2 %
Lymphocytes Relative: 26 %
Lymphs Abs: 2.4 10*3/uL (ref 1.0–3.6)
Monocytes Absolute: 0.5 10*3/uL (ref 0.2–0.9)
Monocytes Relative: 6 %
Neutro Abs: 6 10*3/uL (ref 1.4–6.5)
Neutrophils Relative %: 66 %

## 2018-02-17 LAB — APTT: aPTT: 27 seconds (ref 24–36)

## 2018-02-17 LAB — GLUCOSE, CAPILLARY
Glucose-Capillary: 70 mg/dL (ref 70–99)
Glucose-Capillary: 84 mg/dL (ref 70–99)
Glucose-Capillary: 89 mg/dL (ref 70–99)
Glucose-Capillary: 184 mg/dL — ABNORMAL HIGH (ref 70–99)

## 2018-02-17 LAB — TROPONIN I: Troponin I: <0.03 ng/mL (ref <0.03)

## 2018-02-17 LAB — PROTIME-INR
INR: 0.88
Prothrombin Time: 11.9 seconds (ref 11.4–15.2)

## 2018-02-17 MED ORDER — DEXTROSE 50 % IV SOLN
INTRAVENOUS | Status: AC
  Filled 2018-02-17: qty 50

## 2018-02-17 MED ORDER — DEXTROSE 50 % IV SOLN
INTRAVENOUS | Status: AC
  Administered 2018-02-17: 50 mL via INTRAVENOUS
  Filled 2018-02-17: qty 50

## 2018-02-17 MED ORDER — DEXTROSE 50 % IV SOLN
1.0000 | Freq: Once | INTRAVENOUS | Status: AC
  Administered 2018-02-17: 50 mL via INTRAVENOUS

## 2018-02-17 NOTE — ED Triage Notes (Addendum)
Pt to ED via POV c/o of slurred speech upon waking 10 AM today, could not walk or form words. Pt able to speak clearly now, nonambulatory. Pt daughter states pt was complaining of sharp shooting pain back of left head. Pt states she woke up in sweats last night and felt lightheaded yesterday. Pt not taking any anticoagulants, CBG 17

## 2018-02-17 NOTE — ED Notes (Signed)
UA collected at this time.

## 2018-02-17 NOTE — ED Provider Notes (Signed)
[facility] Emergency Department Provider Note       Time seen: ----------------------------------------- 12:16 PM on 02/17/2018 -----------------------------------------   I have reviewed the triage vital signs and the nursing notes.  HISTORY   Chief Complaint Aphasia    HPI Maria Hinton is a 72 y.o. female with a history of arthritis, asthma, chronic back pain, COPD, who presents to the ED for slurred speech upon awakening at 10 AM this morning.  She could not walk or form words.  She was able to speak clearly now but nonambulatory.  Daughter states she was complaining of headache.  She woke up with sweats last night and felt lightheaded yesterday.  She does not take any blood thinners.  Past Medical History:  Diagnosis Date  . Actinic keratoses    follows with dermatology   . Arthritis   . Asthma   . Chronic back pain    DDD,herniated disc/spondylosis/radiculopathy  . COPD (chronic obstructive pulmonary disease) ([place])   . Diabetes mellitus    1980s  . Early cataracts, bilateral    Dr. Ruthine Dose - [facility]  . GERD (gastroesophageal reflux disease)    takes Omeprazole daily  . Hemorrhoids   . History of colonic polyps    colonoscopy 04/2012 - Dr Candace Cruise  . Hyperlipidemia    takes Pravastatin daily  . Hypertension    takes Metoprolol/HCTZ daily  . Insomnia   . Joint pain    fingers  . Joint swelling   . Pneumonia    x 2 ;in Dec 2010/2011  . PONV (postoperative nausea and vomiting)   . Right leg weakness     Patient Active Problem List   Diagnosis Date Noted  . Elevated alkaline phosphatase measurement 11/12/2017  . Elevated liver enzymes 10/10/2017  . Right lumbar radiculopathy 09/22/2017  . Esophagitis, unspecified   . Columnar-lined esophagus   . Stomach irritation   . Gastric polyp   . Hiatal hernia   . Weakness 08/26/2017  . Weight loss, abnormal 05/24/2017  . Dysphagia 05/24/2017  . Tachycardia 05/24/2017  .  Nausea 05/24/2017  . Generalized weakness 05/24/2017  . Back pain 05/24/2017  . Left lumbar radiculopathy 12/30/2016  . Skin neoplasm 06/06/2016  . Venous (peripheral) insufficiency 06/06/2016  . Right hip pain 12/12/2015  . Insomnia secondary to anxiety 12/12/2015  . History of lumbar laminectomy for spinal cord decompression 08/18/2015  . Recurrent headache 05/20/2014  . Extrinsic asthma, unspecified 02/21/2014  . History of tobacco abuse 02/21/2014  . Screening for osteoporosis 02/09/2014  . Pulmonary disease 02/09/2014  . Encounter for smoking cessation counseling 04/27/2013  . Arthritis 04/27/2013  . DM type 2 (diabetes mellitus, type 2) ([place]) 04/11/2013  . HTN (hypertension), benign 04/11/2013  . HLD (hyperlipidemia) 04/11/2013    Past Surgical History:  Procedure Laterality Date  . ABDOMINAL HYSTERECTOMY     at age 68  . BACK SURGERY  20+yrs ago  . COLONOSCOPY    . COLONOSCOPY N/A 05/13/2015   Procedure: COLONOSCOPY;  Surgeon: Hulen Luster, MD;  Location: [facility] ENDOSCOPY;  Service: Gastroenterology;  Laterality: N/A;  . ESOPHAGOGASTRODUODENOSCOPY  2011  . ESOPHAGOGASTRODUODENOSCOPY (EGD) WITH PROPOFOL N/A 09/02/2017   Procedure: ESOPHAGOGASTRODUODENOSCOPY (EGD) WITH PROPOFOL;  Surgeon: Virgel Manifold, MD;  Location: [facility] ENDOSCOPY;  Service: Endoscopy;  Laterality: N/A;  . FOOT SURGERY  2011   bunionectomy and knot removed from bottom of both feet  . JOINT REPLACEMENT Left 2014   hip  Kraskinski  . LUMBAR  LAMINECTOMY/DECOMPRESSION MICRODISCECTOMY  09/10/2011   Procedure: LUMBAR LAMINECTOMY/DECOMPRESSION MICRODISCECTOMY 1 LEVEL;  Surgeon: Hosie Spangle, MD;  Location: [place] NEURO ORS;  Service: Neurosurgery;  Laterality: Right;  RIGHT Lumbar  Laminotomy and microdiskectomy Lumbar Four-Five  . LUMBAR LAMINECTOMY/DECOMPRESSION MICRODISCECTOMY N/A 06/26/2015   Procedure: LUMBAR LAMINECTOMY/DECOMPRESSION MICRODISCECTOMY 1 LEVEL;  Surgeon: Jovita Gamma, MD;  Location: [place]  NEURO ORS;  Service: Neurosurgery;  Laterality: N/A;  L3 and L4 Laminectomies  . MELANOMA EXCISION  2011   on nose/back on neck  . THORACIC DISC SURGERY  at age 17 and 83    mass of veins that had gotten into muscle and wrapped around rib;3 ribs also removed from left side  . TONSILLECTOMY  as a child   and adenoids   . TOTAL HIP ARTHROPLASTY Left 2014   Dr. Mack Guise  . TUBAL LIGATION    . VAGINA SURGERY     vaginal wall ruptured     Allergies Vicodin [hydrocodone-acetaminophen]  Social History Social History   Tobacco Use  . Smoking status: Current Every Day Smoker    Packs/day: 0.25    Years: 20.00    Pack years: 5.00    Types: Cigarettes    Last attempt to quit: 2018    Years since quitting: 1.6  . Smokeless tobacco: Never Used  . Tobacco comment: Pt quit 11/08/2014.Pt had quit smoking for 13 years before starting back in 2010; per 05/2017 visit quit smoking 2017 smoked total 25 years 1 ppd   Substance Use Topics  . Alcohol use: No    Alcohol/week: 0.0 standard drinks  . Drug use: No   Review of Systems Constitutional: Negative for fever. Cardiovascular: Negative for chest pain. Respiratory: Negative for shortness of breath. Gastrointestinal: Negative for abdominal pain, vomiting and diarrhea. Musculoskeletal: Negative for back pain. Skin: Positive for sweating Neurological: Positive for weakness, speech disturbance  All systems negative/normal/unremarkable except as stated in the HPI  ____________________________________________   PHYSICAL EXAM:  VITAL SIGNS: ED Triage Vitals  Enc Vitals Group     BP 02/17/18 1202 (!) 143/75     Pulse Rate 02/17/18 1202 62     Resp --      Temp 02/17/18 1202 97.7 F (36.5 C)     Temp Source 02/17/18 1202 Oral     SpO2 02/17/18 1202 100 %     Weight 02/17/18 1204 143 lb (64.9 kg)     Height 02/17/18 1204 5\' 4"  (1.626 m)     Head Circumference --      Peak Flow --      Pain Score 02/17/18 1204 0     Pain Loc --       Pain Edu? --      Excl. in [place]? --    Constitutional: Well appearing and in no distress. Eyes: Conjunctivae are normal. Normal extraocular movements. ENT   Head: Normocephalic and atraumatic.   Nose: No congestion/rhinnorhea.   Mouth/Throat: Mucous membranes are moist.   Neck: No stridor. Cardiovascular: Normal rate, regular rhythm. No murmurs, rubs, or gallops. Respiratory: Normal respiratory effort without tachypnea nor retractions. Breath sounds are clear and equal bilaterally. No wheezes/rales/rhonchi. Gastrointestinal: Soft and nontender. Normal bowel sounds Musculoskeletal: Nontender with normal range of motion in extremities. No lower extremity tenderness nor edema. Neurologic:  Normal speech and language. No gross focal neurologic deficits are appreciated.  Skin:  Skin is warm, dry and intact. No rash noted. Psychiatric: Mood and affect are normal. Speech and behavior are normal.  ____________________________________________  EKG: Interpreted by me.  Sinus tachycardia with a rate of 104 bpm, normal PR interval, normal QRS, normal QT  ____________________________________________  ED COURSE:  As part of my medical decision making, I reviewed the following data within the [place] History obtained from family if available, nursing notes, old chart and ekg, as well as notes from prior ED visits. Patient presented for slurred speech, weakness and headache, we will assess with labs and imaging as indicated at this time.  Patient was found to be markedly hypoglycemic, received D50   Procedures ____________________________________________   LABS (pertinent positives/negatives)  Labs Reviewed  GLUCOSE, CAPILLARY - Abnormal; Notable for the following components:      Result Value   Glucose-Capillary 17 (*)    All other components within normal limits  COMPREHENSIVE METABOLIC PANEL - Abnormal; Notable for the following components:   Glucose, Bld 64  (*)    Creatinine, Ser 1.09 (*)    Calcium 8.7 (*)    Alkaline Phosphatase 149 (*)    GFR calc non Af Amer 50 (*)    GFR calc Af Amer 58 (*)    All other components within normal limits  GLUCOSE, CAPILLARY - Abnormal; Notable for the following components:   Glucose-Capillary 184 (*)    All other components within normal limits  URINE CULTURE  PROTIME-INR  APTT  CBC  DIFFERENTIAL  TROPONIN I  GLUCOSE, CAPILLARY  GLUCOSE, CAPILLARY  GLUCOSE, CAPILLARY  URINALYSIS, COMPLETE (UACMP) WITH MICROSCOPIC  CBG MONITORING, ED    RADIOLOGY Images were viewed by me  IMPRESSION: No acute intracranial abnormality.  ____________________________________________  DIFFERENTIAL DIAGNOSIS   Hypoglycemia, CVA, dehydration, electrolyte abnormality, occult infection  FINAL ASSESSMENT AND PLAN  Hypoglycemia   Plan: The patient had presented for stroke symptoms likely secondary to hypoglycemia. Patient's labs this revealed hypoglycemia but no other acute process. Patient's imaging was negative.  She had resolution in her symptoms after D50.   Laurence Aly, MD   Note: This note was generated in part or whole with voice recognition software. Voice recognition is usually quite accurate but there are transcription errors that can and very often do occur. I apologize for any typographical errors that were not detected and corrected.     Earleen Newport, MD 02/17/18 [phone]

## 2018-02-17 NOTE — ED Notes (Addendum)
Pt given something to eat with MD permission. Pt assisted to the bathroom, attempted to collect UA. EDP to bedside at this time to assess patient. Will continue to monitor.

## 2018-02-17 NOTE — ED Triage Notes (Signed)
Pt not on any anticoagulants.

## 2018-02-17 NOTE — ED Notes (Signed)
First RN Annie Main notified of pt's BS critical 17. Per RN take pt to room 3 STAT. IV established first then patient taken by MD [facility] and given verbal order for D50. Pt given D50. Pt taken to CT and rechecked BS, 84 at this time. Pt test performed. Pt then transferred to room 3. Report given to Jinny Blossom and MD Jimmye Norman at bedside with other staff.

## 2018-02-17 NOTE — ED Notes (Signed)
MD notified of repeat CBG 70, pt given 4oz Apple Juice to drink at this time. Per MD cancel code stroke orders at this time.

## 2018-02-17 NOTE — ED Notes (Signed)
MD aware pt's CBG recheck 89 after 4 oz of apple juice, per MD give another ampule of Dextrose 50%. Medications administered per MD order.

## 2018-02-17 NOTE — ED Notes (Signed)
Report given to MD Jimmye Norman and Jinny Blossom at this time

## 2018-02-17 NOTE — ED Notes (Signed)
Pt arrived to room, EDP at bedside at this time. Pt is alert and oriented on arrival to room, no facial droop noted at this time, no slurred speech noted at this time. Pt with full ROM noted to all extremities. Pt c/o HA to L side of her head at this time. Pt states took her meds last night and ate dinner, takes medications to control blood sugar but has never had issues with low blood sugar before. Pt able to answer questions without difficulty.

## 2018-02-18 LAB — URINE CULTURE

## 2018-02-18 LAB — GLUCOSE, CAPILLARY: Glucose-Capillary: 17 mg/dL — CL (ref 70–99)

## 2018-02-25 ENCOUNTER — Ambulatory Visit: Payer: Medicare HMO | Admitting: Internal Medicine

## 2018-02-25 VITALS — BP 122/64 | HR 70 | Temp 98.5°F | Resp 18 | Wt 151.4 lb

## 2018-02-25 DIAGNOSIS — Z794 Long term (current) use of insulin: Secondary | ICD-10-CM

## 2018-02-25 DIAGNOSIS — E119 Type 2 diabetes mellitus without complications: Secondary | ICD-10-CM | POA: Diagnosis not present

## 2018-02-25 DIAGNOSIS — Z23 Encounter for immunization: Secondary | ICD-10-CM

## 2018-02-25 LAB — POCT GLYCOSYLATED HEMOGLOBIN (HGB A1C): Hemoglobin A1C: 6.4 % — AB (ref 4.0–5.6)

## 2018-02-25 NOTE — Progress Notes (Addendum)
Subjective:  Patient ID: Maria Hinton, female    DOB: 04-29-1946  Age: 72 y.o. MRN: [id number]  CC: The primary encounter diagnosis was Type 2 diabetes mellitus without complication, with long-term current use of insulin ([place]). A diagnosis of Need for 23-polyvalent pneumococcal polysaccharide vaccine was also pertinent to this visit.  HPI Maria Hinton presents for ER follow up for hypoglycemic episode that occurred on August 15  and presented with left arm paralysis,  Diaphoresis at 4:30 am . She recalls that she was  unable to roll over,  But finally did and went back to sleep.  She woke up again at 10 am and had an expressive aphasia , lethargy and headache. Taken to ER by dtr in [employer].  Head CT was  Almost done prior to have BS checked,  But fortunately the ER physician intercepted her transit to CT and had CBG done.    CBG was 17 in ER.. Given IV dextrose boluses x 2 and fed.   Cr was normal at 1.09 serum glucose was 64 . glipizide was suspended  For two days,  but  patient has not resumed it due to fear of restarting.  All recurrent episdoes of grogginess and nocturnal  Diaphoresis have stopped since suspending gipizide .  Last a1c was 7.0 in April . Still taking levelmir 20 units at night and metformin 1000 mg TWICE DAILY   Does not check BS cue to cost of supplies.   Under treatment for thyrotoxicoses by Dr. Dwyane Dee  With 26 lb weight loss  , weight now stabilized.  She is now taking one 1/2 tablet of methimzole daily . Weight has increased  by 8 lbs.       Outpatient Medications Prior to Visit  Medication Sig Dispense Refill  . albuterol (PROVENTIL HFA;VENTOLIN HFA) 108 (90 Base) MCG/ACT inhaler Inhale 2 puffs into the lungs every 6 (six) hours as needed for wheezing. 6.7 g 11  . ALPRAZolam (XANAX) 0.25 MG tablet Take 1 tablet (0.25 mg total) by mouth 2 (two) times daily as needed. 60 tablet 5  . aspirin EC 81 MG tablet Take 81 mg by mouth daily.    Marland Kitchen atenolol (TENORMIN) 50 MG tablet  TAKE 1 TABLET (50 MG TOTAL) BY MOUTH 2 (TWO) TIMES DAILY. 180 tablet 3  . atorvastatin (LIPITOR) 40 MG tablet TAKE 1 TABLET BY MOUTH DAILY AT 6 PM. 90 tablet 3  . BD PEN NEEDLE NANO U/F 32G X 4 MM MISC USE DAILY AS DIRECTED 100 each 1  . hydrochlorothiazide (MICROZIDE) 12.5 MG capsule Take 12.5 mg by mouth daily.    . Insulin Detemir (LEVEMIR FLEXPEN) 100 UNIT/ML Pen Inject 20 Units into the skin daily at 10 pm. 15 mL 11  . lisinopril (PRINIVIL,ZESTRIL) 10 MG tablet TAKE 1 TABLET BY MOUTH DAILY 90 tablet 1  . metFORMIN (GLUCOPHAGE) 1000 MG tablet TAKE 1 TABLET BY MOUTH 2 TIMES DAILY WITH A MEAL. 180 tablet 3  . methimazole (TAPAZOLE) 10 MG tablet TAKE 1 TABLET BY MOUTH THREE TIMES A DAY (Patient taking differently: Take 5 mg by mouth daily. ) 90 tablet 0  . omeprazole (PRILOSEC) 40 MG capsule TAKE 1 CAPSULE BY MOUTH EVERY DAY 90 capsule 1  . traMADol (ULTRAM) 50 MG tablet Take 1 tablet (50 mg total) by mouth every 6 (six) hours as needed. FOR SPINAL STENOSIS PAIN 120 tablet 5  . glipiZIDE (GLUCOTROL) 10 MG tablet TAKE 1 TABLET BY MOUTH 2 TIMES DAILY BEFORE A  MEAL. (Patient not taking: Reported on 02/25/2018) 180 tablet 3   No facility-administered medications prior to visit.     Review of Systems;  Patient denies headache, fevers, malaise, unintentional weight loss, skin rash, eye pain, sinus congestion and sinus pain, sore throat, dysphagia,  hemoptysis , cough, dyspnea, wheezing, chest pain, palpitations, orthopnea, edema, abdominal pain, nausea, melena, diarrhea, constipation, flank pain, dysuria, hematuria, urinary  Frequency, nocturia, numbness, tingling, seizures,  Focal weakness, Loss of consciousness,  Tremor, insomnia, depression, anxiety, and suicidal ideation.      Objective:  BP 122/64 (BP Location: Left Arm, Patient Position: Sitting, Cuff Size: Normal)   Pulse 70   Temp 98.5 F (36.9 C) (Oral)   Resp 18   Wt 151 lb 6.4 oz (68.7 kg)   SpO2 97%   BMI 25.99 kg/m   BP  Readings from Last 3 Encounters:  02/25/18 122/64  02/17/18 (!) 145/76  01/12/18 130/80    Wt Readings from Last 3 Encounters:  02/25/18 151 lb 6.4 oz (68.7 kg)  02/17/18 143 lb (64.9 kg)  01/12/18 149 lb 6.4 oz (67.8 kg)    General appearance: alert, cooperative and appears stated age Ears: normal TM's and external ear canals both ears Throat: lips, mucosa, and tongue normal; teeth and gums normal Neck: no adenopathy, no carotid bruit, supple, symmetrical, trachea midline and thyroid not enlarged, symmetric, no tenderness/mass/nodules Back: symmetric, no curvature. ROM normal. No CVA tenderness. Lungs: clear to auscultation bilaterally Heart: regular rate and rhythm, S1, S2 normal, no murmur, click, rub or gallop Abdomen: soft, non-tender; bowel sounds normal; no masses,  no organomegaly Pulses: 2+ and symmetric Skin: Skin color, texture, turgor normal. No rashes or lesions Lymph nodes: Cervical, supraclavicular, and axillary nodes normal.  Lab Results  Component Value Date   HGBA1C 6.4 (A) 02/25/2018   HGBA1C 7.0 (H) 10/05/2017   HGBA1C 7.4 (H) 07/07/2017    Lab Results  Component Value Date   CREATININE 1.09 (H) 02/17/2018   CREATININE 0.93 11/11/2017   CREATININE 0.89 10/05/2017    Lab Results  Component Value Date   WBC 9.1 02/17/2018   HGB 13.8 02/17/2018   HCT 39.6 02/17/2018   PLT 300 02/17/2018   GLUCOSE 64 (L) 02/17/2018   CHOL 118 10/05/2017   TRIG 112.0 10/05/2017   HDL 42.50 10/05/2017   LDLDIRECT 81.0 07/07/2017   LDLCALC 53 10/05/2017   ALT 14 02/17/2018   AST 25 02/17/2018   NA 136 02/17/2018   K 4.6 02/17/2018   CL 99 02/17/2018   CREATININE 1.09 (H) 02/17/2018   BUN 20 02/17/2018   CO2 28 02/17/2018   TSH 10.53 (H) 01/05/2018   INR 0.88 02/17/2018   HGBA1C 6.4 (A) 02/25/2018   MICROALBUR 1.6 07/07/2017    Lab Results  Component Value Date   HGBA1C 6.4 (A) 02/25/2018    Ct Head Wo Contrast  Result Date: 02/17/2018 CLINICAL DATA:   Slurred speech EXAM: CT HEAD WITHOUT CONTRAST TECHNIQUE: Contiguous axial images were obtained from the base of the skull through the vertex without intravenous contrast. COMPARISON:  05/28/2014 FINDINGS: Brain: No acute intracranial abnormality. Specifically, no hemorrhage, hydrocephalus, mass lesion, acute infarction, or significant intracranial injury. Vascular: No hyperdense vessel or unexpected calcification. Skull: No acute calvarial abnormality. Sinuses/Orbits: Visualized paranasal sinuses and mastoids clear. Orbital soft tissues unremarkable. Other: None IMPRESSION: No acute intracranial abnormality. Electronically Signed   By: Rolm Baptise M.D.   On: 02/17/2018 12:27    Assessment & Plan:  Problem List Items Addressed This Visit    DM type 2 (diabetes mellitus, type 2) ([place]) - Primary    With recurrent severe hypoglycemia caused by concurrent use of insulin and glipizide without serial CBG monitoring at home.  Glipizide stopped,  levemir dose reduce to 15 units  .  Lab Results  Component Value Date   HGBA1C 6.4 (A) 02/25/2018         Relevant Orders   POCT HgB A1C (Completed)    Other Visit Diagnoses    Need for 23-polyvalent pneumococcal polysaccharide vaccine       Relevant Orders   Pneumococcal polysaccharide vaccine 23-valent greater than or equal to 2yo subcutaneous/IM (Completed)     A total of 25 minutes was spent with patient more than half of which was spent in counseling patient on the above mentioned issues , reviewing and explaining recent labs and imaging studies done, and coordination of care.  I have discontinued Palyn F. Hobdy's glipiZIDE. I am also having her maintain her aspirin EC, atenolol, atorvastatin, metFORMIN, Insulin Detemir, traMADol, hydrochlorothiazide, lisinopril, BD PEN NEEDLE NANO U/F, albuterol, ALPRAZolam, methimazole, and omeprazole.  No orders of the defined types were placed in this encounter.   Medications Discontinued During This  Encounter  Medication Reason  . glipiZIDE (GLUCOTROL) 10 MG tablet     Follow-up: No follow-ups on file.   Crecencio Mc, MD

## 2018-02-25 NOTE — Patient Instructions (Addendum)
Stay off the glipizide.  Reduce  Levemir dose  to 15  units and metformin at current dose.    For one week:  Check fasting Blood sugars(  Early morning )  And send me via mychart    Hypoglycemia Hypoglycemia occurs when the level of sugar (glucose) in the blood is too low. Glucose is a type of sugar that provides the body's main source of energy. Certain hormones (insulin and glucagon) control the level of glucose in the blood. Insulin lowers blood glucose, and glucagon increases blood glucose. Hypoglycemia can result from having too much insulin in the bloodstream, or from not eating enough food that contains glucose. Hypoglycemia can happen in people who do or do not have diabetes. It can develop quickly, and it can be a medical emergency. What are the causes? Hypoglycemia occurs most often in people who have diabetes. If you have diabetes, hypoglycemia may be caused by:  Diabetes medicine.  Not eating enough, or not eating often enough.  Increased physical activity.  Drinking alcohol, especially when you have not eaten recently.  If you do not have diabetes, hypoglycemia may be caused by:  A tumor in the pancreas. The pancreas is the organ that makes insulin.  Not eating enough, or not eating for long periods at a time (fasting).  Severe infection or illness that affects the liver, heart, or kidneys.  Certain medicines.  You may also have reactive hypoglycemia. This condition causes hypoglycemia within 4 hours of eating a meal. This may occur after having stomach surgery. Sometimes, the cause of reactive hypoglycemia is not known. What increases the risk? Hypoglycemia is more likely to develop in:  People who have diabetes and take medicines to lower blood glucose.  People who abuse alcohol.  People who have a severe illness.  What are the signs or symptoms? Hypoglycemia may not cause any symptoms. If you have symptoms, they may include:  Hunger.  Anxiety.  Sweating  and feeling clammy.  Confusion.  Dizziness or feeling light-headed.  Sleepiness.  Nausea.  Increased heart rate.  Headache.  Blurry vision.  Seizure.  Nightmares.  Tingling or numbness around the mouth, lips, or tongue.  A change in speech.  Decreased ability to concentrate.  A change in coordination.  Restless sleep.  Tremors or shakes.  Fainting.  Irritability.  How is this diagnosed? Hypoglycemia is diagnosed with a blood test to measure your blood glucose level. This blood test is done while you are having symptoms. Your health care provider may also do a physical exam and review your medical history. If you do not have diabetes, other tests may be done to find the cause of your hypoglycemia. How is this treated? This condition can often be treated by immediately eating or drinking something that contains glucose, such as:  3-4 sugar tablets (glucose pills).  Glucose gel, 15-gram tube.  Fruit juice, 4 oz (120 mL).  Regular soda (not diet soda), 4 oz (120 mL).  Low-fat milk, 4 oz (120 mL).  Several pieces of hard candy.  Sugar or honey, 1 Tbsp.  Treating Hypoglycemia If You Have Diabetes  If you are alert and able to swallow safely, follow the 15:15 rule:  Take 15 grams of a rapid-acting carbohydrate. Rapid-acting options include: ? 1 tube of glucose gel. ? 3 glucose pills. ? 6-8 pieces of hard candy. ? 4 oz (120 mL) of fruit juice. ? 4 oz (120 ml) of regular (not diet) soda.  Check your blood glucose 15  minutes after you take the carbohydrate.  If the repeat blood glucose level is still at or below 70 mg/dL (3.9 mmol/L), take 15 grams of a carbohydrate again.  If your blood glucose level does not increase above 70 mg/dL (3.9 mmol/L) after 3 tries, seek emergency medical care.  After your blood glucose level returns to normal, eat a meal or a snack within 1 hour.  Treating Severe Hypoglycemia Severe hypoglycemia is when your blood glucose  level is at or below 54 mg/dL (3 mmol/L). Severe hypoglycemia is an emergency. Do not wait to see if the symptoms will go away. Get medical help right away. Call your local emergency services (911 in the [place] Do not drive yourself to the hospital. If you have severe hypoglycemia and you cannot eat or drink, you may need an injection of glucagon. A family member or close friend should learn how to check your blood glucose and how to give you a glucagon injection. Ask your health care provider if you need to have an emergency glucagon injection kit available. Severe hypoglycemia may need to be treated in a hospital. The treatment may include getting glucose through an IV tube. You may also need treatment for the cause of your hypoglycemia. Follow these instructions at home: General instructions  Avoid any diets that cause you to not eat enough food. Talk with your health care provider before you start any new diet.  Take over-the-counter and prescription medicines only as told by your health care provider.  Limit alcohol intake to no more than 1 drink per day for nonpregnant women and 2 drinks per day for men. One drink equals 12 oz of beer, 5 oz of wine, or 1 oz of hard liquor.  Keep all follow-up visits as told by your health care provider. This is important. If You Have Diabetes:   Make sure you know the symptoms of hypoglycemia.  Always have a rapid-acting carbohydrate snack with you to treat low blood sugar.  Follow your diabetes management plan, as told by your health care provider. Make sure you: ? Take your medicines as directed. ? Follow your exercise plan. ? Follow your meal plan. Eat on time, and do not skip meals. ? Check your blood glucose as often as directed. Make sure to check your blood glucose before and after exercise. If you exercise longer or in a different way than usual, check your blood glucose more often. ? Follow your sick day plan whenever you cannot eat or drink  normally. Make this plan in advance with your health care provider.  Share your diabetes management plan with people in your workplace, school, and household.  Check your urine for ketones when you are ill and as told by your health care provider.  Carry a medical alert card or wear medical alert jewelry. If You Have Reactive Hypoglycemia or Low Blood Sugar From Other Causes:  Monitor your blood glucose as told by your health care provider.  Follow instructions from your health care provider about eating or drinking restrictions. Contact a health care provider if:  You have problems keeping your blood glucose in your target range.  You have frequent episodes of hypoglycemia. Get help right away if:  You continue to have hypoglycemia symptoms after eating or drinking something containing glucose.  Your blood glucose is at or below 54 mg/dL (3 mmol/L).  You have a seizure.  You faint. These symptoms may represent a serious problem that is an emergency. Do not wait to  see if the symptoms will go away. Get medical help right away. Call your local emergency services (911 in the [place] Do not drive yourself to the hospital. This information is not intended to replace advice given to you by your health care provider. Make sure you discuss any questions you have with your health care provider. Document Released: 06/22/2005 Document Revised: 12/04/2015 Document Reviewed: 07/26/2015 Elsevier Interactive Patient Education  [employer].

## 2018-02-27 ENCOUNTER — Encounter: Payer: Self-pay | Admitting: Internal Medicine

## 2018-02-27 NOTE — Assessment & Plan Note (Addendum)
With recurrent severe hypoglycemia caused by concurrent use of insulin and glipizide without serial CBG monitoring at home.  Glipizide stopped,  levemir dose reduce to 15 units  .  Lab Results  Component Value Date   HGBA1C 6.4 (A) 02/25/2018

## 2018-02-28 ENCOUNTER — Other Ambulatory Visit (INDEPENDENT_AMBULATORY_CARE_PROVIDER_SITE_OTHER): Payer: Medicare HMO

## 2018-02-28 DIAGNOSIS — E0590 Thyrotoxicosis, unspecified without thyrotoxic crisis or storm: Secondary | ICD-10-CM | POA: Diagnosis not present

## 2018-02-28 LAB — T4, FREE: Free T4: 0.94 ng/dL (ref 0.60–1.60)

## 2018-02-28 LAB — TSH: TSH: 2.13 u[IU]/mL (ref 0.35–4.50)

## 2018-02-28 LAB — T3, FREE: T3, Free: 3.4 pg/mL (ref 2.3–4.2)

## 2018-03-01 ENCOUNTER — Other Ambulatory Visit: Payer: Self-pay | Admitting: Internal Medicine

## 2018-03-01 LAB — THYROTROPIN RECEPTOR AUTOABS: Thyrotropin Receptor Ab: 2.79 IU/L — ABNORMAL HIGH (ref 0.00–1.75)

## 2018-03-02 NOTE — Telephone Encounter (Signed)
Tramadol refilled.

## 2018-03-02 NOTE — Telephone Encounter (Signed)
Refilled: 07/09/2017 Last OV: 02/25/2018 Next OV: 04/11/2018

## 2018-03-14 ENCOUNTER — Encounter: Payer: Self-pay | Admitting: Endocrinology

## 2018-03-14 ENCOUNTER — Ambulatory Visit: Payer: Medicare HMO | Admitting: Endocrinology

## 2018-03-14 VITALS — BP 122/60 | HR 67 | Ht 64.0 in | Wt 148.0 lb

## 2018-03-14 DIAGNOSIS — E0590 Thyrotoxicosis, unspecified without thyrotoxic crisis or storm: Secondary | ICD-10-CM

## 2018-03-14 MED ORDER — METHIMAZOLE 5 MG PO TABS: 5.0000 mg | ORAL_TABLET | Freq: Every day | ORAL | 2 refills | Status: DC

## 2018-03-14 NOTE — Progress Notes (Signed)
Patient ID: Maria Hinton, female   DOB: 15-Jan-1946, 72 y.o.   MRN: [id number]                                                                                                              Reason for Appointment:  Hyperthyroidism, follow-up visit   Referring physician: Derrel Nip   History of Present Illness:   Prior history:  For several months before her consultation she had symptoms of weakness, palpitations, shakiness, feeling sweaty in am, nervousness, and fatigue.  Also would feel a strong heartbeat in her ears She had been on atenolol for hypertension also She has had a decreased appetite and started losing weight late last summer  The patient  lost about  26 lbs since these symptoms started   RECENT history:  She is taking methimazole since 3/19 for her hyperthyroidism On her 10/2017 visit she was taking 10 mg twice daily because of hyperthyroidism she was given 30 mg daily  In follow-up she has been requiring periodically reduced doses of her methimazole  Since 7/19 when she was hypothyroid on 10 mg methimazole she was told to start taking 5 mg daily At that time she was having some tiredness at times and feeling cold  She feels fairly good now with her energy level, no cold intolerance or palpitations She continues to take atenolol 50 mg which she has been taking for several years for hypertension  Wt Readings from Last 3 Encounters:  03/14/18 148 lb (67.1 kg)  02/25/18 151 lb 6.4 oz (68.7 kg)  02/17/18 143 lb (64.9 kg)     Thyroid levels are now back to normal including TSH However her thyrotropin receptor antibody is still high as of 8/19  Thyroid function tests as follows:     Lab Results  Component Value Date   FREET4 0.94 02/28/2018   FREET4 0.57 (L) 01/05/2018   FREET4 0.63 11/18/2017   T3FREE 3.4 02/28/2018   T3FREE 2.5 01/05/2018   T3FREE 3.0 11/18/2017   TSH 2.13 02/28/2018   TSH 10.53 (H) 01/05/2018   TSH <0.006 (L) 08/26/2017    Lab Results    Component Value Date   THYROTRECAB 2.79 (H) 02/28/2018   THYROTRECAB 3.58 (H) 11/18/2017   THYROTRECAB 3.76 (H) 09/07/2017     Allergies as of 03/14/2018      Reactions   Vicodin [hydrocodone-acetaminophen] Other (See Comments)   Insomnia       Medication List        Accurate as of 03/14/18  1:10 PM. Always use your most recent med list.          albuterol 108 (90 Base) MCG/ACT inhaler Commonly known as:  PROVENTIL HFA;VENTOLIN HFA Inhale 2 puffs into the lungs every 6 (six) hours as needed for wheezing.   ALPRAZolam 0.25 MG tablet Commonly known as:  XANAX Take 1 tablet (0.25 mg total) by mouth 2 (two) times daily as needed.   aspirin EC 81 MG tablet Take 81 mg by mouth daily.   atenolol 50 MG  tablet Commonly known as:  TENORMIN TAKE 1 TABLET (50 MG TOTAL) BY MOUTH 2 (TWO) TIMES DAILY.   atorvastatin 40 MG tablet Commonly known as:  LIPITOR TAKE 1 TABLET BY MOUTH DAILY AT 6 PM.   BD PEN NEEDLE NANO U/F 32G X 4 MM Misc Generic drug:  Insulin Pen Needle USE DAILY AS DIRECTED   hydrochlorothiazide 12.5 MG capsule Commonly known as:  MICROZIDE Take 12.5 mg by mouth daily.   Insulin Detemir 100 UNIT/ML Pen Commonly known as:  LEVEMIR Inject 20 Units into the skin daily at 10 pm.   lisinopril 10 MG tablet Commonly known as:  PRINIVIL,ZESTRIL TAKE 1 TABLET BY MOUTH DAILY   metFORMIN 1000 MG tablet Commonly known as:  GLUCOPHAGE TAKE 1 TABLET BY MOUTH 2 TIMES DAILY WITH A MEAL.   methimazole 10 MG tablet Commonly known as:  TAPAZOLE TAKE 1 TABLET BY MOUTH THREE TIMES A DAY   omeprazole 40 MG capsule Commonly known as:  PRILOSEC TAKE 1 CAPSULE BY MOUTH EVERY DAY   traMADol 50 MG tablet Commonly known as:  ULTRAM TAKE 1 TABLET BY MOUTH EVERY 6 HOURS AS NEEDED FOR SPINAL STENOSIS PAIN           Past Medical History:  Diagnosis Date  . Actinic keratoses    follows with dermatology   . Arthritis   . Asthma   . Chronic back pain    DDD,herniated  disc/spondylosis/radiculopathy  . COPD (chronic obstructive pulmonary disease) ([place])   . Diabetes mellitus    1980s  . Early cataracts, bilateral    Dr. Ruthine Dose - [facility]  . GERD (gastroesophageal reflux disease)    takes Omeprazole daily  . Hemorrhoids   . History of colonic polyps    colonoscopy 04/2012 - Dr Candace Cruise  . Hyperlipidemia    takes Pravastatin daily  . Hypertension    takes Metoprolol/HCTZ daily  . Insomnia   . Joint pain    fingers  . Joint swelling   . Pneumonia    x 2 ;in Dec 2010/2011  . PONV (postoperative nausea and vomiting)   . Right leg weakness     Past Surgical History:  Procedure Laterality Date  . ABDOMINAL HYSTERECTOMY     at age 39  . BACK SURGERY  20+yrs ago  . COLONOSCOPY    . COLONOSCOPY N/A 05/13/2015   Procedure: COLONOSCOPY;  Surgeon: Hulen Luster, MD;  Location: [facility] ENDOSCOPY;  Service: Gastroenterology;  Laterality: N/A;  . ESOPHAGOGASTRODUODENOSCOPY  2011  . ESOPHAGOGASTRODUODENOSCOPY (EGD) WITH PROPOFOL N/A 09/02/2017   Procedure: ESOPHAGOGASTRODUODENOSCOPY (EGD) WITH PROPOFOL;  Surgeon: Virgel Manifold, MD;  Location: [facility] ENDOSCOPY;  Service: Endoscopy;  Laterality: N/A;  . FOOT SURGERY  2011   bunionectomy and knot removed from bottom of both feet  . JOINT REPLACEMENT Left 2014   hip  Kraskinski  . LUMBAR LAMINECTOMY/DECOMPRESSION MICRODISCECTOMY  09/10/2011   Procedure: LUMBAR LAMINECTOMY/DECOMPRESSION MICRODISCECTOMY 1 LEVEL;  Surgeon: Hosie Spangle, MD;  Location: [place] [facility];  Service: Neurosurgery;  Laterality: Right;  RIGHT Lumbar  Laminotomy and microdiskectomy Lumbar Four-Five  . LUMBAR LAMINECTOMY/DECOMPRESSION MICRODISCECTOMY N/A 06/26/2015   Procedure: LUMBAR LAMINECTOMY/DECOMPRESSION MICRODISCECTOMY 1 LEVEL;  Surgeon: Jovita Gamma, MD;  Location: [place] [facility];  Service: Neurosurgery;  Laterality: N/A;  L3 and L4 Laminectomies  . MELANOMA EXCISION  2011   on nose/back on neck  . THORACIC DISC SURGERY   at age 59 and 63    mass of veins that had gotten  into muscle and wrapped around rib;3 ribs also removed from left side  . TONSILLECTOMY  as a child   and adenoids   . TOTAL HIP ARTHROPLASTY Left 2014   Dr. Mack Guise  . TUBAL LIGATION    . VAGINA SURGERY     vaginal wall ruptured     Family History  Problem Relation Age of Onset  . Heart disease Brother        valve replacement  . COPD Brother   . COPD Mother        Emphysema  . Diabetes Mother   . Other Mother        Died in house fire at age 70.  Marland Kitchen Heart disease Father   . Seizures Father   . Cancer Father        Lung cancer with brain metastasis  . Asthma Brother   . COPD Son 75  . Cancer Other        aunt ? maternal vs paternal side lung cancer   . Diabetes Maternal Grandmother   . Anesthesia problems Neg Hx   . Hypotension Neg Hx   . Malignant hyperthermia Neg Hx   . Pseudochol deficiency Neg Hx   . Thyroid disease Neg Hx     Social History:  reports that she has been smoking cigarettes. She has a 5.00 pack-year smoking history. She has never used smokeless tobacco. She reports that she does not drink alcohol or use drugs.  Allergies:  Allergies  Allergen Reactions  . Vicodin [Hydrocodone-Acetaminophen] Other (See Comments)    Insomnia      Review of Systems  She had a severe hypoglycemic event last month, glipizide was stopped and she has had a follow-up with her PCP    Examination:   BP 122/60   Pulse 67   Ht 5\' 4"  (1.626 m)   Wt 148 lb (67.1 kg)   SpO2 96%   BMI 25.40 kg/m   Thyroid not palpable Skin appears normal Biceps reflexes show normal relaxation   Assessment/Plan:   Hyperthyroidism, diagnosed to be from Graves' disease, on treatment since 09/2017  Although she had initially required as much as 30 mg of methimazole for control in the last few months she is needing progressively lower doses Currently with 5 mg a day of methimazole her thyroid levels are back to normal She looks  euthyroid on exam No goiter  Her thyroid levels are normal She has been on 5 mg daily since about early July With her thyrotropin receptor antibody still being high may not be able to get into remission until at least the end of the year  She can continue the 5 mg methimazole Discussed that if she has any recurrence of hyperthyroid symptoms or unusual fatigue she will let us know   Follow-up in about 7 weeks  Type II diabetes: Followed by PCP, reportedly well controlled  Elayne Snare 03/14/2018, 1:10 PM    Note: This office note was prepared with Dragon voice recognition system technology. Any transcriptional errors that result from this process are unintentional.

## 2018-03-29 ENCOUNTER — Ambulatory Visit: Payer: Medicare HMO | Admitting: Internal Medicine

## 2018-03-29 ENCOUNTER — Encounter: Payer: Self-pay | Admitting: Internal Medicine

## 2018-03-29 VITALS — BP 118/62 | HR 78 | Resp 16 | Ht 64.0 in | Wt 151.0 lb

## 2018-03-29 DIAGNOSIS — J449 Chronic obstructive pulmonary disease, unspecified: Secondary | ICD-10-CM

## 2018-03-29 DIAGNOSIS — J438 Other emphysema: Secondary | ICD-10-CM

## 2018-03-29 NOTE — Progress Notes (Signed)
[facility] Consultation      MRN# [id number] Maria Hinton 04-Jul-1946   Assessment and Plan 72 yo F with COPD Stage B, seen for follow up visit, for COPD  COPD.  --She is doing well with rescue inhaler occasionally, no maintenance inhaler has been required.  --Continue to use rescue inhaler as needed.  --Can follow up as needed.  --Abs eosinophil count 06/01/16; 600.  --Lung cancer screening; insufficient smoking history.  Flu: she has an appt with PCP next week and will get her flu shot at that time. PCV23 02/25/18 Prevnar 13 02/2014.    Return if symptoms worsen..    Brief History: 72 yo female with COPD Stage B, Quit smoking 07/2015, currently only on rescue inhaler.    Events since last clinic visit: Today she feels that her breathing is doing well, she takes her dog for walks for 30 min twice per day, she is not limited by breathing.  She last smoked about a year ago. She is now using albuterol about once every 3-4 months. She uses no maintenance inhalers. She moved out of a smoky house about 6 weeks ago and has been doing much better since then.   **CT chest 06/03/17>> slight apical groundglass changes, in the right upper lobe, left lower lobe posterior scarring, pleural thickening, likely related to rib fracture in that area, this may be postsurgical change.  Medication:    Current Outpatient Medications:  .  albuterol (PROVENTIL HFA;VENTOLIN HFA) 108 (90 Base) MCG/ACT inhaler, Inhale 2 puffs into the lungs every 6 (six) hours as needed for wheezing., Disp: 6.7 g, Rfl: 11 .  ALPRAZolam (XANAX) 0.25 MG tablet, Take 1 tablet (0.25 mg total) by mouth 2 (two) times daily as needed., Disp: 60 tablet, Rfl: 5 .  aspirin EC 81 MG tablet, Take 81 mg by mouth daily., Disp: , Rfl:  .  atenolol (TENORMIN) 50 MG tablet, TAKE 1 TABLET (50 MG TOTAL) BY MOUTH 2 (TWO) TIMES DAILY., Disp: 180 tablet, Rfl: 3 .  atorvastatin (LIPITOR) 40 MG tablet, TAKE 1 TABLET BY  MOUTH DAILY AT 6 PM., Disp: 90 tablet, Rfl: 3 .  BD PEN NEEDLE NANO U/F 32G X 4 MM MISC, USE DAILY AS DIRECTED, Disp: 100 each, Rfl: 1 .  hydrochlorothiazide (MICROZIDE) 12.5 MG capsule, Take 12.5 mg by mouth daily., Disp: , Rfl:  .  Insulin Detemir (LEVEMIR FLEXPEN) 100 UNIT/ML Pen, Inject 20 Units into the skin daily at 10 pm., Disp: 15 mL, Rfl: 11 .  lisinopril (PRINIVIL,ZESTRIL) 10 MG tablet, TAKE 1 TABLET BY MOUTH DAILY, Disp: 90 tablet, Rfl: 1 .  metFORMIN (GLUCOPHAGE) 1000 MG tablet, TAKE 1 TABLET BY MOUTH 2 TIMES DAILY WITH A MEAL., Disp: 180 tablet, Rfl: 3 .  methimazole (TAPAZOLE) 5 MG tablet, Take 1 tablet (5 mg total) by mouth daily., Disp: 30 tablet, Rfl: 2 .  omeprazole (PRILOSEC) 40 MG capsule, TAKE 1 CAPSULE BY MOUTH EVERY DAY, Disp: 90 capsule, Rfl: 1 .  traMADol (ULTRAM) 50 MG tablet, TAKE 1 TABLET BY MOUTH EVERY 6 HOURS AS NEEDED FOR SPINAL STENOSIS PAIN, Disp: 120 tablet, Rfl: 5   Review of Systems:  Constitutional: Feels well. Cardiovascular: Denies chest pain, exertional chest pain.  Pulmonary: Denies hemoptysis, pleuritic chest pain.   The remainder of systems were reviewed and were found to be negative other than what is documented in the HPI.     Physical Examination:   VS: BP 118/62 (BP Location: Left Arm, Cuff Size:  Normal)   Pulse 78   Resp 16   Ht 5\' 4"  (1.626 m)   Wt 151 lb (68.5 kg)   SpO2 96%   BMI 25.92 kg/m   General Appearance: No distress  Neuro:without focal findings, mental status, speech normal, alert and oriented HEENT: PERRLA, EOM intact Pulmonary: No wheezing, No rales  CardiovascularNormal S1,S2.  No m/r/g.  Abdomen: Benign, Soft, non-tender, No masses Renal:  No costovertebral tenderness  GU:  No performed at this time. Endoc: No evident thyromegaly, no signs of acromegaly or Cushing features Skin:   warm, no rashes, no ecchymosis  Extremities: normal, no cyanosis, clubbing.    Thank  you for the visitation and for allowing   [facility] to assist in the care of your patient. Our recommendations are noted above.  Please contact us if we can be of further service.  Marda Stalker, M.D., F.C.C.P.  Board Certified in Internal Medicine, Pulmonary Medicine, [place], and Sleep Medicine.  [facility] Office Number: [phone]

## 2018-03-29 NOTE — Patient Instructions (Signed)
Continue to be active.  Use albuterol inhaler as needed.

## 2018-04-11 ENCOUNTER — Encounter: Payer: Self-pay | Admitting: Internal Medicine

## 2018-04-11 ENCOUNTER — Ambulatory Visit: Payer: Medicare HMO | Admitting: Internal Medicine

## 2018-04-11 VITALS — BP 118/66 | HR 66 | Temp 98.3°F | Resp 15 | Ht 64.0 in | Wt 149.4 lb

## 2018-04-11 DIAGNOSIS — R748 Abnormal levels of other serum enzymes: Secondary | ICD-10-CM | POA: Diagnosis not present

## 2018-04-11 DIAGNOSIS — R519 Headache, unspecified: Secondary | ICD-10-CM

## 2018-04-11 DIAGNOSIS — E119 Type 2 diabetes mellitus without complications: Secondary | ICD-10-CM | POA: Diagnosis not present

## 2018-04-11 DIAGNOSIS — I10 Essential (primary) hypertension: Secondary | ICD-10-CM | POA: Diagnosis not present

## 2018-04-11 DIAGNOSIS — Z794 Long term (current) use of insulin: Secondary | ICD-10-CM | POA: Diagnosis not present

## 2018-04-11 DIAGNOSIS — Z23 Encounter for immunization: Secondary | ICD-10-CM | POA: Diagnosis not present

## 2018-04-11 DIAGNOSIS — R51 Headache: Secondary | ICD-10-CM | POA: Diagnosis not present

## 2018-04-11 DIAGNOSIS — R69 Illness, unspecified: Secondary | ICD-10-CM | POA: Diagnosis not present

## 2018-04-11 LAB — POCT GLYCOSYLATED HEMOGLOBIN (HGB A1C): Hemoglobin A1C: 6.4 % — AB (ref 4.0–5.6)

## 2018-04-11 MED ORDER — GLUCOSE BLOOD VI STRP: ORAL_STRIP | 12 refills | Status: DC

## 2018-04-11 MED ORDER — INSULIN PEN NEEDLE 32G X 4 MM MISC: 1 refills | Status: DC

## 2018-04-11 MED ORDER — ONETOUCH ULTRASOFT LANCETS MISC: 12 refills | Status: DC

## 2018-04-11 NOTE — Patient Instructions (Addendum)
Decrease levemir dose to 10 units daily and  Send me morning blood sugars in one week   You received the high dose flu vaccine today   Make an appointment for non fasting labs in November

## 2018-04-11 NOTE — Progress Notes (Signed)
Subjective:  Patient ID: Maria Hinton, female    DOB: [birth year]  Age: 72 y.o. MRN: [id number]  CC: The primary encounter diagnosis was Type 2 diabetes mellitus without complication, with long-term current use of insulin ([place]). Diagnoses of Elevated alkaline phosphatase measurement, Encounter for immunization, HTN (hypertension), benign, and Recurrent headache were also pertinent to this visit.  HPI Maria Hinton presents for 3 month follow up on diabetes.  Patient has no complaints today.  Patient is following a low glycemic index diet and taking all prescribed medications regularly without side effects.  Fasting sugars have been under less than 140 most of the time and post prandials have been under 160 except on rare occasions. Patient is not exercising  Or  intentionally trying to lose weight .  Patient has had an eye exam in the last 12 months and checks feet regularly for signs of infection.  Patient does not walk barefoot outside,  And denies any numbness tingling or burning in feet. Patient is up to date on all recommended vaccinations  Stopped the glipizide due to low blood sugars .  Still having lows on 15 units Lantus  Has run out of test strips. No lows .  No appetite  Eats only twice daily .  sandwiches mostly    Sent a mychart message on august 29 re low blood sugars . Message was not forwarded   Outpatient Medications Prior to Visit  Medication Sig Dispense Refill  . albuterol (PROVENTIL HFA;VENTOLIN HFA) 108 (90 Base) MCG/ACT inhaler Inhale 2 puffs into the lungs every 6 (six) hours as needed for wheezing. 6.7 g 11  . ALPRAZolam (XANAX) 0.25 MG tablet Take 1 tablet (0.25 mg total) by mouth 2 (two) times daily as needed. 60 tablet 5  . aspirin EC 81 MG tablet Take 81 mg by mouth daily.    Marland Kitchen atenolol (TENORMIN) 50 MG tablet TAKE 1 TABLET (50 MG TOTAL) BY MOUTH 2 (TWO) TIMES DAILY. 180 tablet 3  . atorvastatin (LIPITOR) 40 MG tablet TAKE 1 TABLET BY MOUTH DAILY AT 6 PM. 90  tablet 3  . hydrochlorothiazide (MICROZIDE) 12.5 MG capsule Take 12.5 mg by mouth daily.    . Insulin Detemir (LEVEMIR FLEXPEN) 100 UNIT/ML Pen Inject 20 Units into the skin daily at 10 pm. 15 mL 11  . lisinopril (PRINIVIL,ZESTRIL) 10 MG tablet TAKE 1 TABLET BY MOUTH DAILY 90 tablet 1  . metFORMIN (GLUCOPHAGE) 1000 MG tablet TAKE 1 TABLET BY MOUTH 2 TIMES DAILY WITH A MEAL. 180 tablet 3  . methimazole (TAPAZOLE) 5 MG tablet Take 1 tablet (5 mg total) by mouth daily. 30 tablet 2  . omeprazole (PRILOSEC) 40 MG capsule TAKE 1 CAPSULE BY MOUTH EVERY DAY 90 capsule 1  . traMADol (ULTRAM) 50 MG tablet TAKE 1 TABLET BY MOUTH EVERY 6 HOURS AS NEEDED FOR SPINAL STENOSIS PAIN 120 tablet 5  . BD PEN NEEDLE NANO U/F 32G X 4 MM MISC USE DAILY AS DIRECTED 100 each 1   No facility-administered medications prior to visit.     Review of Systems;  Patient denies headache, fevers, malaise, unintentional weight loss, skin rash, eye pain, sinus congestion and sinus pain, sore throat, dysphagia,  hemoptysis , cough, dyspnea, wheezing, chest pain, palpitations, orthopnea, edema, abdominal pain, nausea, melena, diarrhea, constipation, flank pain, dysuria, hematuria, urinary  Frequency, nocturia, numbness, tingling, seizures,  Focal weakness, Loss of consciousness,  Tremor, insomnia, depression, anxiety, and suicidal ideation.      Objective:  BP  118/66 (BP Location: Left Arm, Patient Position: Sitting, Cuff Size: Normal)   Pulse 66   Temp 98.3 F (36.8 C) (Oral)   Resp 15   Ht 5\' 4"  (1.626 m)   Wt 149 lb 6 oz (67.8 kg)   SpO2 96%   BMI 25.64 kg/m   BP Readings from Last 3 Encounters:  04/11/18 118/66  03/29/18 118/62  03/14/18 122/60    Wt Readings from Last 3 Encounters:  04/11/18 149 lb 6 oz (67.8 kg)  03/29/18 151 lb (68.5 kg)  03/14/18 148 lb (67.1 kg)    General appearance: alert, cooperative and appears stated age Ears: normal TM's and external ear canals both ears Throat: lips, mucosa,  and tongue normal; teeth and gums normal Neck: no adenopathy, no carotid bruit, supple, symmetrical, trachea midline and thyroid not enlarged, symmetric, no tenderness/mass/nodules Back: symmetric, no curvature. ROM normal. No CVA tenderness. Lungs: clear to auscultation bilaterally Heart: regular rate and rhythm, S1, S2 normal, no murmur, click, rub or gallop Abdomen: soft, non-tender; bowel sounds normal; no masses,  no organomegaly Pulses: 2+ and symmetric Skin: Skin color, texture, turgor normal. No rashes or lesions Lymph nodes: Cervical, supraclavicular, and axillary nodes normal.  Lab Results  Component Value Date   HGBA1C 6.4 (A) 04/11/2018   HGBA1C 6.4 (A) 02/25/2018   HGBA1C 7.0 (H) 10/05/2017    Lab Results  Component Value Date   CREATININE 1.09 (H) 02/17/2018   CREATININE 0.93 11/11/2017   CREATININE 0.89 10/05/2017    Lab Results  Component Value Date   WBC 9.1 02/17/2018   HGB 13.8 02/17/2018   HCT 39.6 02/17/2018   PLT 300 02/17/2018   GLUCOSE 64 (L) 02/17/2018   CHOL 118 10/05/2017   TRIG 112.0 10/05/2017   HDL 42.50 10/05/2017   LDLDIRECT 81.0 07/07/2017   LDLCALC 53 10/05/2017   ALT 14 02/17/2018   AST 25 02/17/2018   NA 136 02/17/2018   K 4.6 02/17/2018   CL 99 02/17/2018   CREATININE 1.09 (H) 02/17/2018   BUN 20 02/17/2018   CO2 28 02/17/2018   TSH 2.13 02/28/2018   INR 0.88 02/17/2018   HGBA1C 6.4 (A) 04/11/2018   MICROALBUR 1.6 07/07/2017    Ct Head Wo Contrast  Result Date: 02/17/2018 CLINICAL DATA:  Slurred speech EXAM: CT HEAD WITHOUT CONTRAST TECHNIQUE: Contiguous axial images were obtained from the base of the skull through the vertex without intravenous contrast. COMPARISON:  05/28/2014 FINDINGS: Brain: No acute intracranial abnormality. Specifically, no hemorrhage, hydrocephalus, mass lesion, acute infarction, or significant intracranial injury. Vascular: No hyperdense vessel or unexpected calcification. Skull: No acute calvarial  abnormality. Sinuses/Orbits: Visualized paranasal sinuses and mastoids clear. Orbital soft tissues unremarkable. Other: None IMPRESSION: No acute intracranial abnormality. Electronically Signed   By: Rolm Baptise M.D.   On: 02/17/2018 12:27    Assessment & Plan:   Problem List Items Addressed This Visit    DM type 2 (diabetes mellitus, type 2) ([place]) - Primary    With recurrent severe hypoglycemia caused by concurrent use of insulin and glipizide without serial CBG monitoring at home.  Glipizide stopped,  levemir dose reduced to 10 units today for continue hypoglycemic events    Lab Results  Component Value Date   HGBA1C 6.4 (A) 04/11/2018         Relevant Orders   POCT HgB A1C (Completed)   Elevated alkaline phosphatase measurement   Relevant Orders   Comprehensive metabolic panel   HTN (hypertension), benign  Well controlled on current regimen. Renal function stable, no changes today.      Recurrent headache    Head was scanner during  ER visit in august for slurred speech which was due to hypoglycemia.  CT was normal.        Other Visit Diagnoses    Encounter for immunization       Relevant Orders   Flu vaccine HIGH DOSE PF (Completed)     A total of 25 minutes of face to face time was spent with patient more than half of which was spent in counselling about the above mentioned conditions  and coordination of care   I have changed Ayomide F. Neisen's BD PEN NEEDLE NANO U/F to Insulin Pen Needle. I am also having her start on glucose blood and onetouch ultrasoft. Additionally, I am having her maintain her aspirin EC, atenolol, atorvastatin, metFORMIN, Insulin Detemir, hydrochlorothiazide, lisinopril, albuterol, ALPRAZolam, omeprazole, traMADol, and methimazole.  Meds ordered this encounter  Medications  . glucose blood (ONETOUCH VERIO) test strip    Sig: Use as instructed    Dispense:  100 each    Refill:  12  . Lancets (ONETOUCH ULTRASOFT) lancets    Sig: Use as  instructed    Dispense:  100 each    Refill:  12  . Insulin Pen Needle (BD PEN NEEDLE NANO U/F) 32G X 4 MM MISC    Sig: USE DAILY AS DIRECTED with levemir    Dispense:  100 each    Refill:  1    Medications Discontinued During This Encounter  Medication Reason  . BD PEN NEEDLE NANO U/F 32G X 4 MM MISC Reorder    Follow-up: Return in about 6 months (around 10/11/2018) for follow up diabetes.   Crecencio Mc, MD

## 2018-04-12 NOTE — Assessment & Plan Note (Addendum)
Head was scanner during  ER visit in august for slurred speech which was due to hypoglycemia.  CT was normal.

## 2018-04-12 NOTE — Assessment & Plan Note (Signed)
With recurrent severe hypoglycemia caused by concurrent use of insulin and glipizide without serial CBG monitoring at home.  Glipizide stopped,  levemir dose reduced to 10 units today for continue hypoglycemic events    Lab Results  Component Value Date   HGBA1C 6.4 (A) 04/11/2018

## 2018-04-12 NOTE — Assessment & Plan Note (Signed)
Well controlled on current regimen. Renal function stable, no changes today. 

## 2018-04-21 ENCOUNTER — Encounter: Payer: Self-pay | Admitting: Family Medicine

## 2018-04-21 ENCOUNTER — Ambulatory Visit: Payer: Medicare HMO | Admitting: Family Medicine

## 2018-04-21 VITALS — BP 138/82 | HR 66 | Temp 98.3°F | Ht 64.0 in | Wt 142.6 lb

## 2018-04-21 DIAGNOSIS — A084 Viral intestinal infection, unspecified: Secondary | ICD-10-CM

## 2018-04-21 DIAGNOSIS — R112 Nausea with vomiting, unspecified: Secondary | ICD-10-CM

## 2018-04-21 DIAGNOSIS — R197 Diarrhea, unspecified: Secondary | ICD-10-CM

## 2018-04-21 DIAGNOSIS — R509 Fever, unspecified: Secondary | ICD-10-CM

## 2018-04-21 LAB — POC INFLUENZA A&B (BINAX/QUICKVUE)
Influenza A, POC: NEGATIVE
Influenza B, POC: NEGATIVE

## 2018-04-21 LAB — COMPREHENSIVE METABOLIC PANEL
ALT: 21 U/L (ref 0–35)
AST: 20 U/L (ref 0–37)
Albumin: 3.6 g/dL (ref 3.5–5.2)
Alkaline Phosphatase: 111 U/L (ref 39–117)
BUN: 17 mg/dL (ref 6–23)
CO2: 23 mEq/L (ref 19–32)
Calcium: 8.8 mg/dL (ref 8.4–10.5)
Chloride: 98 mEq/L (ref 96–112)
Creatinine, Ser: 1.13 mg/dL (ref 0.40–1.20)
GFR: 50.57 mL/min — ABNORMAL LOW (ref >60.00)
Glucose, Bld: 243 mg/dL — ABNORMAL HIGH (ref 70–99)
Potassium: 3.3 mEq/L — ABNORMAL LOW (ref 3.5–5.1)
Sodium: 133 mEq/L — ABNORMAL LOW (ref 135–145)
Total Bilirubin: 0.5 mg/dL (ref 0.2–1.2)
Total Protein: 6.9 g/dL (ref 6.0–8.3)

## 2018-04-21 LAB — CBC
HCT: 42.7 % (ref 36.0–46.0)
Hemoglobin: 14.5 g/dL (ref 12.0–15.0)
MCHC: 33.8 g/dL (ref 30.0–36.0)
MCV: 88.2 fl (ref 78.0–100.0)
Platelets: 324 10*3/uL (ref 150.0–400.0)
RBC: 4.85 Mil/uL (ref 3.87–5.11)
RDW: 13.3 % (ref 11.5–15.5)
WBC: 6.4 10*3/uL (ref 4.0–10.5)

## 2018-04-21 MED ORDER — ONDANSETRON 4 MG PO TBDP: 4.0000 mg | ORAL_TABLET | Freq: Three times a day (TID) | ORAL | 1 refills | Status: DC | PRN

## 2018-04-21 MED ORDER — LOPERAMIDE HCL 2 MG PO TABS: 2.0000 mg | ORAL_TABLET | Freq: Four times a day (QID) | ORAL | 1 refills | Status: DC | PRN

## 2018-04-21 NOTE — Progress Notes (Signed)
l °

## 2018-04-21 NOTE — Progress Notes (Signed)
Subjective:    Patient ID: Maria Hinton, female    DOB: 03/23/1946, 72 y.o.   MRN: [id number]  HPI  Presents to clinic with nausea, vomiting, diarrhea, aches, fever/chills for 4-5 days. States it began Saturday 04/16/18 with vomting, nausea and diarrhea and this lasted until Monday. She states the vomiting has improved, still having nausea and some diarrhea. Since Monday, her diarrhea has slowly improved, she is not running to restroom as often.   She has been trying to keep herself hydrated with water and drinking ginger ale.  She is eating some saltine crackers, thought she was feeling better last night and decided to have a chicken salad sandwich with mayonnaise.  States that chicken salad sandwich did not sit well had to have diarrhea soon after eating.  Patient Active Problem List   Diagnosis Date Noted  . Elevated alkaline phosphatase measurement 11/12/2017  . Elevated liver enzymes 10/10/2017  . Right lumbar radiculopathy 09/22/2017  . Esophagitis, unspecified   . Columnar-lined esophagus   . Stomach irritation   . Gastric polyp   . Hiatal hernia   . Weakness 08/26/2017  . Dysphagia 05/24/2017  . Tachycardia 05/24/2017  . Nausea 05/24/2017  . Generalized weakness 05/24/2017  . Back pain 05/24/2017  . Left lumbar radiculopathy 12/30/2016  . Skin neoplasm 06/06/2016  . Venous (peripheral) insufficiency 06/06/2016  . Right hip pain 12/12/2015  . Insomnia secondary to anxiety 12/12/2015  . History of lumbar laminectomy for spinal cord decompression 08/18/2015  . Recurrent headache 05/20/2014  . Extrinsic asthma, unspecified 02/21/2014  . History of tobacco abuse 02/21/2014  . Screening for osteoporosis 02/09/2014  . Pulmonary disease 02/09/2014  . Encounter for smoking cessation counseling 04/27/2013  . Arthritis 04/27/2013  . DM type 2 (diabetes mellitus, type 2) ([place]) 04/11/2013  . HTN (hypertension), benign 04/11/2013  . HLD (hyperlipidemia) 04/11/2013   Social  History   Tobacco Use  . Smoking status: Current Every Day Smoker    Packs/day: 0.25    Years: 20.00    Pack years: 5.00    Types: Cigarettes    Last attempt to quit: 2018    Years since quitting: 1.7  . Smokeless tobacco: Never Used  . Tobacco comment: Pt quit 11/08/2014.Pt had quit smoking for 13 years before starting back in 2010; per 05/2017 visit quit smoking 2017 smoked total 25 years 1 ppd   Substance Use Topics  . Alcohol use: No    Alcohol/week: 0.0 standard drinks    Review of Systems  Constitutional: +chills, fatigue and fever.  HENT: Negative for congestion, ear pain, sinus pain and sore throat.   Eyes: Negative.   Respiratory: Negative for cough, shortness of breath and wheezing.   Cardiovascular: Negative for chest pain, palpitations and leg swelling.  Gastrointestinal: +abdominal pain, diarrhea, nausea and vomiting.  Genitourinary: Negative for dysuria, frequency and urgency.  Musculoskeletal: Negative for arthralgias and myalgias.  Skin: Negative for color change, pallor and rash.  Neurological: Negative for syncope, light-headedness and headaches.  Psychiatric/Behavioral: The patient is not nervous/anxious.       Objective:   Physical Exam  Constitutional: She is oriented to person, place, and time.  HENT:  Head: Normocephalic and atraumatic.  Mouth/Throat: Oropharynx is clear and moist. No oropharyngeal exudate.  Eyes: EOM are normal. No scleral icterus.  Cardiovascular: Normal rate and regular rhythm.  Pulmonary/Chest: Effort normal and breath sounds normal. No respiratory distress. She has no wheezes. She has no rhonchi. She has no rales.  Abdominal: Soft. Normal appearance and bowel sounds are normal. She exhibits no distension and no mass. There is generalized tenderness. There is no rigidity, no rebound and no guarding. No hernia.  Neurological: She is alert and oriented to person, place, and time.  Skin: Skin is warm and dry. No pallor.  Psychiatric:  She has a normal mood and affect. Her behavior is normal.  Nursing note and vitals reviewed.     Vitals:   04/21/18 0827  BP: 138/82  Pulse: 66  Temp: 98.3 F (36.8 C)  SpO2: 95%   Assessment & Plan:    Viral gastroenteritis/nausea vomiting and diarrhea - patient will use Zofran as needed to help calm nausea.  She will use Imodium to help combat diarrhea.  Patient given handout outlining bland food diet.  Advised to drink clear liquids and very bland foods for the next 2 to 3 days and then slowly advance diet as tolerated.  Due to length of time with diarrhea, nausea, vomiting symptoms we will check CBC and CMP to monitor electrolytes and assess for any elevated white cell count/anemia.  Fever and chills -- point-of-care flu swab is negative in clinic.  Fever and chills have seemed to improve on their own.  Patient advised that if needed she can take a Tylenol to treat fever.  Also made aware that if fevers return to call office right away and let us know.  Keep regular follow-up as scheduled.  Return to clinic sooner if issues arise.  I expect patient's symptoms to continue to slowly improve as they have been doing, if they begin to decline again, advised to call office right away and/or go to the emergency department

## 2018-04-21 NOTE — Patient Instructions (Signed)
Keep self well hydrated with water, gatorade, pedialyte. Take small sips of fluids when drinking. Chicken or beef broth, jello, popsicles are also good ways to get fluid in.    Bland Diet A bland diet consists of foods that do not have a lot of fat or fiber. Foods without fat or fiber are easier for the body to digest. They are also less likely to irritate your mouth, throat, stomach, and other parts of your gastrointestinal tract. A bland diet is sometimes called a BRAT diet. What is my plan? Your health care provider or dietitian may recommend specific changes to your diet to prevent and treat your symptoms, such as:  Eating small meals often.  Cooking food until it is soft enough to chew easily.  Chewing your food well.  Drinking fluids slowly.  Not eating foods that are very spicy, sour, or fatty.  Not eating citrus fruits, such as oranges and grapefruit.  What do I need to know about this diet?  Eat a variety of foods from the bland diet food list.  Do not follow a bland diet longer than you have to.  Ask your health care provider whether you should take vitamins. What foods can I eat? Grains  Hot cereals, such as cream of wheat. Bread, crackers, or tortillas made from refined white flour. Rice. Vegetables Canned or cooked vegetables. Mashed or boiled potatoes. Fruits Bananas. Applesauce. Other types of cooked or canned fruit with the skin and seeds removed, such as canned peaches or pears. Meats and Other Protein Sources Scrambled eggs. Creamy peanut butter or other nut butters. Lean, well-cooked meats, such as chicken or fish. Tofu. Soups or broths. Dairy Low-fat dairy products, such as milk, cottage cheese, or yogurt. Beverages Water. Herbal tea. Apple juice. Sweets and Desserts Pudding. Custard. Fruit gelatin. Ice cream. Fats and Oils Mild salad dressings. Canola or olive oil. The items listed above may not be a complete list of allowed foods or beverages.  Contact your dietitian for more options. What foods are not recommended? Foods and ingredients that are often not recommended include:  Spicy foods, such as hot sauce or salsa.  Fried foods.  Sour foods, such as pickled or fermented foods.  Raw vegetables or fruits, especially citrus or berries.  Caffeinated drinks.  Alcohol.  Strongly flavored seasonings or condiments.  The items listed above may not be a complete list of foods and beverages that are not allowed. Contact your dietitian for more information. This information is not intended to replace advice given to you by your health care provider. Make sure you discuss any questions you have with your health care provider. Document Released: 10/14/2015 Document Revised: 11/28/2015 Document Reviewed: 07/04/2014 Elsevier Interactive Patient Education  2018 [employer].

## 2018-04-26 ENCOUNTER — Other Ambulatory Visit: Payer: Self-pay | Admitting: Internal Medicine

## 2018-04-27 ENCOUNTER — Ambulatory Visit: Payer: Medicare HMO | Admitting: Gastroenterology

## 2018-04-27 ENCOUNTER — Encounter: Payer: Self-pay | Admitting: Gastroenterology

## 2018-04-27 VITALS — BP 118/74 | HR 97 | Ht 64.0 in | Wt 142.8 lb

## 2018-04-27 DIAGNOSIS — R1310 Dysphagia, unspecified: Secondary | ICD-10-CM

## 2018-04-27 DIAGNOSIS — R0989 Other specified symptoms and signs involving the circulatory and respiratory systems: Secondary | ICD-10-CM | POA: Diagnosis not present

## 2018-04-27 DIAGNOSIS — R198 Other specified symptoms and signs involving the digestive system and abdomen: Secondary | ICD-10-CM

## 2018-04-27 DIAGNOSIS — R1319 Other dysphagia: Secondary | ICD-10-CM

## 2018-04-27 MED ORDER — OMEPRAZOLE 40 MG PO CPDR: 40.0000 mg | DELAYED_RELEASE_CAPSULE | Freq: Two times a day (BID) | ORAL | 0 refills | Status: DC

## 2018-04-27 NOTE — Progress Notes (Signed)
Maria Antigua, MD [address]  [place]  [place], [place] [address]  Main: [phone]  Fax: [phone]   Primary Care Physician: Crecencio Mc, MD  Primary Gastroenterologist:  Dr. Vonda Hinton  Chief complaint: Here for follow-up of dysphagia  HPI: Maria Hinton is a 72 y.o. female here for follow-up of dysphagia.  This patient reports continued dysphagia, occurs daily with pills, and intermittently with some solid foods.  No dysphagia with liquids.  No weight loss.  Is taking omeprazole once daily, and states that has significantly improved her heartburn, and helps with the dysphagia somewhat, but is still having dysphagia daily.  No episodes of food impaction.  Previous history: Patient had an upper GI series that was normal.  EGD done for dysphagia, showed esophageal mucosal changes suggestive of eosinophilic esophagitis.  Z line at 39 cm.  Salmon-colored mucosa suspicious for Barrett's.  Erythematous antrum.  Few gastric polyps.  Small hiatal hernia. Biopsies showed no eosinophils in the distal or proximal esophagus.  Reflux gastroesophagitis at the GE junction.  Stomach polyp was fundic gland polyp.  No H. pylori, and mild chronic gastritis was noted.  She was diagnosed with Graves' disease, and mild elevation in alk phos was thought to be likely due to thyroid disease versus no changes in medications.  Alk phos was 153 at its highest, and on recent check on April 21, 2017 by primary care provider is completely normal.  This is consistent with alk phos likely being from thyroid disease versus medication changes, and normalization is also consistent with the same.  Patient also had fatty liver suspected on CT scan in November 2018, with normal liver enzymes otherwise at this time.  Patient is hepatitis B immune, and this was checked in November 2018.  Hep C antibody was negative at the time.  Ultrasound in May 2019 was normal liver.  Last colonoscopy in 2016  by Dr. Candace Cruise, no polyps reported, repeat recommended in 5 years.  Current Outpatient Medications  Medication Sig Dispense Refill  . albuterol (PROVENTIL HFA;VENTOLIN HFA) 108 (90 Base) MCG/ACT inhaler Inhale 2 puffs into the lungs every 6 (six) hours as needed for wheezing. 6.7 g 11  . ALPRAZolam (XANAX) 0.25 MG tablet Take 1 tablet (0.25 mg total) by mouth 2 (two) times daily as needed. 60 tablet 5  . aspirin EC 81 MG tablet Take 81 mg by mouth daily.    Marland Kitchen atenolol (TENORMIN) 50 MG tablet TAKE 1 TABLET (50 MG TOTAL) BY MOUTH 2 (TWO) TIMES DAILY. 180 tablet 3  . atorvastatin (LIPITOR) 40 MG tablet TAKE 1 TABLET BY MOUTH DAILY AT 6 PM. 90 tablet 3  . glucose blood (ONETOUCH VERIO) test strip Use as instructed 100 each 12  . hydrochlorothiazide (MICROZIDE) 12.5 MG capsule Take 12.5 mg by mouth daily.    . Insulin Detemir (LEVEMIR FLEXPEN) 100 UNIT/ML Pen Inject 20 Units into the skin daily at 10 pm. 15 mL 11  . Insulin Pen Needle (BD PEN NEEDLE NANO U/F) 32G X 4 MM MISC USE DAILY AS DIRECTED with levemir 100 each 1  . Lancets (ONETOUCH ULTRASOFT) lancets Use as instructed 100 each 12  . lisinopril (PRINIVIL,ZESTRIL) 10 MG tablet TAKE 1 TABLET BY MOUTH DAILY 90 tablet 1  . loperamide (IMODIUM A-D) 2 MG tablet Take 1 tablet (2 mg total) by mouth 4 (four) times daily as needed for diarrhea or loose stools. 30 tablet 1  . metFORMIN (GLUCOPHAGE) 1000 MG tablet TAKE 1 TABLET  BY MOUTH 2 TIMES DAILY WITH A MEAL. 180 tablet 3  . methimazole (TAPAZOLE) 5 MG tablet Take 1 tablet (5 mg total) by mouth daily. 30 tablet 2  . omeprazole (PRILOSEC) 40 MG capsule TAKE 1 CAPSULE BY MOUTH EVERY DAY 90 capsule 1  . ondansetron (ZOFRAN ODT) 4 MG disintegrating tablet Take 1 tablet (4 mg total) by mouth every 8 (eight) hours as needed for nausea or vomiting. 30 tablet 1  . traMADol (ULTRAM) 50 MG tablet TAKE 1 TABLET BY MOUTH EVERY 6 HOURS AS NEEDED FOR SPINAL STENOSIS PAIN 120 tablet 5   No current facility-administered  medications for this visit.     Allergies as of 04/27/2018 - Review Complete 04/21/2018  Allergen Reaction Noted  . Vicodin [hydrocodone-acetaminophen] Other (See Comments) 09/08/2011    ROS:  General: Negative for anorexia, weight loss, fever, chills, fatigue, weakness. ENT: Negative for hoarseness, difficulty swallowing , nasal congestion. CV: Negative for chest pain, angina, palpitations, dyspnea on exertion, peripheral edema.  Respiratory: Negative for dyspnea at rest, dyspnea on exertion, cough, sputum, wheezing.  GI: See history of present illness. GU:  Negative for dysuria, hematuria, urinary incontinence, urinary frequency, nocturnal urination.  Endo: Negative for unusual weight change.    Physical Examination:   There were no vitals taken for this visit.  General: Well-nourished, well-developed in no acute distress.  Eyes: No icterus. Conjunctivae pink. Mouth: Oropharyngeal mucosa moist and pink , no lesions erythema or exudate. Neck: Supple, Trachea midline Abdomen: Bowel sounds are normal, nontender, nondistended, no hepatosplenomegaly or masses, no abdominal bruits or hernia , no rebound or guarding.   Extremities: No lower extremity edema. No clubbing or deformities. Neuro: Alert and oriented x 3.  Grossly intact. Skin: Warm and dry, no jaundice.   Psych: Alert and cooperative, normal mood and affect.   Labs: CMP     Component Value Date/Time   NA 133 (L) 04/21/2018 0858   NA 141 11/30/2012 0545   K 3.3 (L) 04/21/2018 0858   K 4.1 11/30/2012 0545   CL 98 04/21/2018 0858   CL 108 (H) 11/30/2012 0545   CO2 23 04/21/2018 0858   CO2 26 11/30/2012 0545   GLUCOSE 243 (H) 04/21/2018 0858   GLUCOSE 191 (H) 11/30/2012 0545   BUN 17 04/21/2018 0858   BUN 22 (H) 11/30/2012 0545   CREATININE 1.13 04/21/2018 0858   CREATININE 1.10 11/30/2012 0545   CALCIUM 8.8 04/21/2018 0858   CALCIUM 7.9 (L) 11/30/2012 0545   PROT 6.9 04/21/2018 0858   PROT 6.0 (L) 04/22/2012  0701   ALBUMIN 3.6 04/21/2018 0858   ALBUMIN 1.9 (L) 04/22/2012 0701   AST 20 04/21/2018 0858   AST 11 (L) 04/22/2012 0701   ALT 21 04/21/2018 0858   ALT 13 04/22/2012 0701   ALKPHOS 111 04/21/2018 0858   ALKPHOS 129 04/22/2012 0701   BILITOT 0.5 04/21/2018 0858   BILITOT 0.4 04/22/2012 0701   GFRNONAA 50 (L) 02/17/2018 1207   GFRNONAA 53 (L) 11/30/2012 0545   GFRAA 58 (L) 02/17/2018 1207   GFRAA >60 11/30/2012 0545   Lab Results  Component Value Date   WBC 6.4 04/21/2018   HGB 14.5 04/21/2018   HCT 42.7 04/21/2018   MCV 88.2 04/21/2018   PLT 324.0 04/21/2018    Imaging Studies: No results found.  Assessment and Plan:   GORDON CARLSON is a 72 y.o. y/o female here for follow-up of dysphagia, suspected fatty liver, elevated alk phos which is now normalized  after treatment of her abnormal thyroid function tests  Dysphagia Negative upper GI series EGD with biopsies did not show eosinophilic esophagitis However, biopsies were done in the setting of PPI use, and could have affected biopsy results, as endoscopic appearance is reported to suggest eosinophilic esophagitis Therefore, given ongoing symptoms, will try twice daily PPI, and I have asked the patient to call us in 2 to 3 weeks to let us know if dysphagia is improved with that dosage, because if not, we will decrease PPI back to once daily.  She verbalized understanding. (Risks of PPI use were discussed with patient including bone loss, C. Diff diarrhea, pneumonia, infections, CKD, electrolyte abnormalities.  If clinically possible based on symptoms, goal would be to maintain patient on the lowest dose possible, or discontinue the medication with institution of acid reflux lifestyle modifications over time. Pt. Verbalizes understanding and chooses to continue the medication.)  I also discussed manometry to evaluate for motility disorders, and she is interested but would like to wait 4 to 6 weeks, we will thus try to schedule  4-6 weeks out.   Next colonoscopy due in 2021.  Elevated alk phos, now normalized, and likely due to thyroid dysfunction which is improved  Fatty liver noted on CT was not present on ultrasound and transaminases are normal Patient is hepatitis B immune She can follow with primary care provider for hepatitis A vaccination if she is not immune Patient encouraged to eat healthy, and lose weight in general for health purposes, which will also help any underlying fatty liver.  However, ultrasound reporting normal liver is reassuring.  Follow-up in clinic in 2 to 3 months Dr Maria Hinton

## 2018-04-27 NOTE — Telephone Encounter (Signed)
Refilled: 10/08/2017 Last OV: 04/11/2018 Next OV: 10/11/2018

## 2018-05-05 ENCOUNTER — Encounter: Payer: Self-pay | Admitting: *Deleted

## 2018-05-05 ENCOUNTER — Other Ambulatory Visit (INDEPENDENT_AMBULATORY_CARE_PROVIDER_SITE_OTHER): Payer: Medicare HMO

## 2018-05-05 DIAGNOSIS — E0590 Thyrotoxicosis, unspecified without thyrotoxic crisis or storm: Secondary | ICD-10-CM

## 2018-05-05 DIAGNOSIS — R748 Abnormal levels of other serum enzymes: Secondary | ICD-10-CM

## 2018-05-05 LAB — COMPREHENSIVE METABOLIC PANEL
ALT: 17 U/L (ref 0–35)
AST: 15 U/L (ref 0–37)
Albumin: 3.5 g/dL (ref 3.5–5.2)
Alkaline Phosphatase: 107 U/L (ref 39–117)
BUN: 20 mg/dL (ref 6–23)
CO2: 29 mEq/L (ref 19–32)
Calcium: 8.7 mg/dL (ref 8.4–10.5)
Chloride: 102 mEq/L (ref 96–112)
Creatinine, Ser: 0.95 mg/dL (ref 0.40–1.20)
GFR: 61.77 mL/min (ref >60.00)
Glucose, Bld: 233 mg/dL — ABNORMAL HIGH (ref 70–99)
Potassium: 4.9 mEq/L (ref 3.5–5.1)
Sodium: 139 mEq/L (ref 135–145)
Total Bilirubin: 0.6 mg/dL (ref 0.2–1.2)
Total Protein: 5.9 g/dL — ABNORMAL LOW (ref 6.0–8.3)

## 2018-05-05 LAB — T3, FREE: T3, Free: 3.7 pg/mL (ref 2.3–4.2)

## 2018-05-05 LAB — T4, FREE: Free T4: 1.13 ng/dL (ref 0.60–1.60)

## 2018-05-05 LAB — TSH: TSH: <0.01 u[IU]/mL — ABNORMAL LOW (ref 0.35–4.50)

## 2018-05-09 ENCOUNTER — Encounter: Payer: Self-pay | Admitting: Endocrinology

## 2018-05-09 ENCOUNTER — Ambulatory Visit: Payer: Medicare HMO | Admitting: Endocrinology

## 2018-05-09 VITALS — BP 112/70 | HR 60 | Ht 64.0 in | Wt 150.0 lb

## 2018-05-09 DIAGNOSIS — E0590 Thyrotoxicosis, unspecified without thyrotoxic crisis or storm: Secondary | ICD-10-CM

## 2018-05-09 NOTE — Progress Notes (Signed)
Patient ID: Maria Hinton, female   DOB: 08-17-1945, 72 y.o.   MRN: [id number]                                                                                                              Reason for Appointment:  Hyperthyroidism, follow-up visit   Referring physician: Derrel Nip   History of Present Illness:   Prior history:  For several months before her consultation she had symptoms of weakness, palpitations, shakiness, feeling sweaty in am, nervousness, and fatigue.  Also would feel a strong heartbeat in her ears She had been on atenolol for hypertension also She has had a decreased appetite and started losing weight late last summer  The patient  lost about  26 lbs since these symptoms started   RECENT history:  She is taking methimazole since 3/19 for her hyperthyroidism On her 10/2017 visit she was taking 10 mg twice daily because of hyperthyroidism she was given 30 mg daily  In follow-up she has been requiring periodically reduced doses of her methimazole  Since 7/19 she has been taking 5 mg daily She was last seen about 8 weeks ago and no change was made on her last visit  She feels weak because of a stomach virus she had recently However has not had any of the original symptoms of hyperthyroidism, her weight has fluctuated but is back up again No heat intolerance or shakiness  She continues to take atenolol 50 mg which she has been taking for several years for hypertension  Wt Readings from Last 3 Encounters:  05/09/18 150 lb (68 kg)  04/27/18 142 lb 12.8 oz (64.8 kg)  04/21/18 142 lb 9.6 oz (64.7 kg)     Thyroid levels are still normal although free T4 and T3 are marginally higher than before with suppressed TSH Also her thyrotropin receptor antibody is still high as of 8/19  Thyroid function tests as follows:     Lab Results  Component Value Date   FREET4 1.13 05/05/2018   FREET4 0.94 02/28/2018   FREET4 0.57 (L) 01/05/2018   T3FREE 3.7 05/05/2018   T3FREE 3.4  02/28/2018   T3FREE 2.5 01/05/2018   TSH <0.01 Repeated and verified X2. (L) 05/05/2018   TSH 2.13 02/28/2018   TSH 10.53 (H) 01/05/2018    Lab Results  Component Value Date   THYROTRECAB 2.79 (H) 02/28/2018   THYROTRECAB 3.58 (H) 11/18/2017   THYROTRECAB 3.76 (H) 09/07/2017     Allergies as of 05/09/2018      Reactions   Vicodin [hydrocodone-acetaminophen] Other (See Comments)   Insomnia       Medication List        Accurate as of 05/09/18  9:37 AM. Always use your most recent med list.          albuterol 108 (90 Base) MCG/ACT inhaler Commonly known as:  PROVENTIL HFA;VENTOLIN HFA Inhale 2 puffs into the lungs every 6 (six) hours as needed for wheezing.   ALPRAZolam 0.25 MG tablet Commonly known as:  XANAX TAKE  1 TABLET BY MOUTH TWICE A DAY AS NEEDED   aspirin EC 81 MG tablet Take 81 mg by mouth daily.   atenolol 50 MG tablet Commonly known as:  TENORMIN TAKE 1 TABLET (50 MG TOTAL) BY MOUTH 2 (TWO) TIMES DAILY.   atorvastatin 40 MG tablet Commonly known as:  LIPITOR TAKE 1 TABLET BY MOUTH DAILY AT 6 PM.   glucose blood test strip Use as instructed   hydrochlorothiazide 12.5 MG capsule Commonly known as:  MICROZIDE Take 12.5 mg by mouth daily.   Insulin Detemir 100 UNIT/ML Pen Commonly known as:  LEVEMIR Inject 20 Units into the skin daily at 10 pm.   Insulin Pen Needle 32G X 4 MM Misc USE DAILY AS DIRECTED with levemir   lisinopril 10 MG tablet Commonly known as:  PRINIVIL,ZESTRIL TAKE 1 TABLET BY MOUTH DAILY   loperamide 2 MG tablet Commonly known as:  IMODIUM A-D Take 1 tablet (2 mg total) by mouth 4 (four) times daily as needed for diarrhea or loose stools.   metFORMIN 1000 MG tablet Commonly known as:  GLUCOPHAGE TAKE 1 TABLET BY MOUTH 2 TIMES DAILY WITH A MEAL.   methimazole 5 MG tablet Commonly known as:  TAPAZOLE Take 1 tablet (5 mg total) by mouth daily.   omeprazole 40 MG capsule Commonly known as:  PRILOSEC Take 1 capsule (40 mg  total) by mouth 2 (two) times daily.   ondansetron 4 MG disintegrating tablet Commonly known as:  ZOFRAN-ODT Take 1 tablet (4 mg total) by mouth every 8 (eight) hours as needed for nausea or vomiting.   onetouch ultrasoft lancets Use as instructed   traMADol 50 MG tablet Commonly known as:  ULTRAM TAKE 1 TABLET BY MOUTH EVERY 6 HOURS AS NEEDED FOR SPINAL STENOSIS PAIN           Past Medical History:  Diagnosis Date  . Actinic keratoses    follows with dermatology   . Arthritis   . Asthma   . Chronic back pain    DDD,herniated disc/spondylosis/radiculopathy  . COPD (chronic obstructive pulmonary disease) ([place])   . Diabetes mellitus    1980s  . Early cataracts, bilateral    Dr. Ruthine Dose - [facility]  . GERD (gastroesophageal reflux disease)    takes Omeprazole daily  . Hemorrhoids   . History of colonic polyps    colonoscopy 04/2012 - Dr Candace Cruise  . Hyperlipidemia    takes Pravastatin daily  . Hypertension    takes Metoprolol/HCTZ daily  . Insomnia   . Joint pain    fingers  . Joint swelling   . Pneumonia    x 2 ;in Dec 2010/2011  . PONV (postoperative nausea and vomiting)   . Right leg weakness     Past Surgical History:  Procedure Laterality Date  . ABDOMINAL HYSTERECTOMY     at age 38  . BACK SURGERY  20+yrs ago  . COLONOSCOPY    . COLONOSCOPY N/A 05/13/2015   Procedure: COLONOSCOPY;  Surgeon: Hulen Luster, MD;  Location: [facility] ENDOSCOPY;  Service: Gastroenterology;  Laterality: N/A;  . ESOPHAGOGASTRODUODENOSCOPY  2011  . ESOPHAGOGASTRODUODENOSCOPY (EGD) WITH PROPOFOL N/A 09/02/2017   Procedure: ESOPHAGOGASTRODUODENOSCOPY (EGD) WITH PROPOFOL;  Surgeon: Virgel Manifold, MD;  Location: [facility] ENDOSCOPY;  Service: Endoscopy;  Laterality: N/A;  . FOOT SURGERY  2011   bunionectomy and knot removed from bottom of both feet  . JOINT REPLACEMENT Left 2014   hip  Kraskinski  . LUMBAR LAMINECTOMY/DECOMPRESSION MICRODISCECTOMY  09/10/2011   Procedure: LUMBAR  LAMINECTOMY/DECOMPRESSION MICRODISCECTOMY 1 LEVEL;  Surgeon: Hosie Spangle, MD;  Location: [place] NEURO ORS;  Service: Neurosurgery;  Laterality: Right;  RIGHT Lumbar  Laminotomy and microdiskectomy Lumbar Four-Five  . LUMBAR LAMINECTOMY/DECOMPRESSION MICRODISCECTOMY N/A 06/26/2015   Procedure: LUMBAR LAMINECTOMY/DECOMPRESSION MICRODISCECTOMY 1 LEVEL;  Surgeon: Jovita Gamma, MD;  Location: [place] NEURO ORS;  Service: Neurosurgery;  Laterality: N/A;  L3 and L4 Laminectomies  . MELANOMA EXCISION  2011   on nose/back on neck  . THORACIC DISC SURGERY  at age 34 and 63    mass of veins that had gotten into muscle and wrapped around rib;3 ribs also removed from left side  . TONSILLECTOMY  as a child   and adenoids   . TOTAL HIP ARTHROPLASTY Left 2014   Dr. Mack Guise  . TUBAL LIGATION    . VAGINA SURGERY     vaginal wall ruptured     Family History  Problem Relation Age of Onset  . Heart disease Brother        valve replacement  . COPD Brother   . COPD Mother        Emphysema  . Diabetes Mother   . Other Mother        Died in house fire at age 50.  Marland Kitchen Heart disease Father   . Seizures Father   . Cancer Father        Lung cancer with brain metastasis  . Asthma Brother   . COPD Son 74  . Cancer Other        aunt ? maternal vs paternal side lung cancer   . Diabetes Maternal Grandmother   . Anesthesia problems Neg Hx   . Hypotension Neg Hx   . Malignant hyperthermia Neg Hx   . Pseudochol deficiency Neg Hx   . Thyroid disease Neg Hx     Social History:  reports that she quit smoking about 22 months ago. Her smoking use included cigarettes. She has a 5.00 pack-year smoking history. She has never used smokeless tobacco. She reports that she does not drink alcohol or use drugs.  Allergies:  Allergies  Allergen Reactions  . Vicodin [Hydrocodone-Acetaminophen] Other (See Comments)    Insomnia      Review of Systems  She is on insulin for diabetes and followed by PCP  Lab Results    Component Value Date   HGBA1C 6.4 (A) 04/11/2018   HGBA1C 6.4 (A) 02/25/2018   HGBA1C 7.0 (H) 10/05/2017   Lab Results  Component Value Date   MICROALBUR 1.6 07/07/2017   LDLCALC 53 10/05/2017   CREATININE 0.95 05/05/2018       Examination:   BP 112/70   Pulse 60   Ht 5\' 4"  (1.626 m)   Wt 150 lb (68 kg)   SpO2 97%   BMI 25.75 kg/m   Thyroid not enlarged No tremor  Biceps reflexes show normal relaxation   Assessment/Plan:   Hyperthyroidism, diagnosed to be from Graves' disease, on treatment since 09/2017  Although she had initially required as much as 30 mg of methimazole for control her dose is now 5 mg since  Subjectively doing well Free T4 and T3 are quite normal although slightly higher than in September Her exam is unremarkable  She can continue the 5 mg methimazole To let us know if she starts having any heat intolerance, unusual weight loss or palpitations Discussed that if she has increasing her thyroid levels and difficulty stopping her methimazole over the  next 6 months may consider I-131 treatment   Follow-up in about 6 weeks  Recent GI virus: Appears to have recovered  Elayne Snare 05/09/2018, 9:37 AM    Note: This office note was prepared with Dragon voice recognition system technology. Any transcriptional errors that result from this process are unintentional.

## 2018-05-13 DIAGNOSIS — Z9889 Other specified postprocedural states: Secondary | ICD-10-CM | POA: Diagnosis not present

## 2018-05-13 DIAGNOSIS — M48062 Spinal stenosis, lumbar region with neurogenic claudication: Secondary | ICD-10-CM | POA: Diagnosis not present

## 2018-05-13 DIAGNOSIS — M4155 Other secondary scoliosis, thoracolumbar region: Secondary | ICD-10-CM | POA: Diagnosis not present

## 2018-05-13 DIAGNOSIS — M4726 Other spondylosis with radiculopathy, lumbar region: Secondary | ICD-10-CM | POA: Diagnosis not present

## 2018-05-13 DIAGNOSIS — M7138 Other bursal cyst, other site: Secondary | ICD-10-CM | POA: Diagnosis not present

## 2018-05-13 DIAGNOSIS — M5136 Other intervertebral disc degeneration, lumbar region: Secondary | ICD-10-CM | POA: Diagnosis not present

## 2018-05-17 ENCOUNTER — Telehealth: Payer: Self-pay | Admitting: Gastroenterology

## 2018-05-17 ENCOUNTER — Other Ambulatory Visit: Payer: Self-pay | Admitting: Internal Medicine

## 2018-05-17 DIAGNOSIS — Z1231 Encounter for screening mammogram for malignant neoplasm of breast: Secondary | ICD-10-CM

## 2018-05-17 NOTE — Telephone Encounter (Signed)
Pt  Left vm she states Dr. Bonna Gains set her up for a procedure 05/24/18 she also was put on rx Nexium 2 tables a day and since has no more knot in her throat and would like to cancel her procedure

## 2018-05-19 MED ORDER — OMEPRAZOLE 20 MG PO CPDR: 20.0000 mg | DELAYED_RELEASE_CAPSULE | Freq: Every day | ORAL | 1 refills | Status: DC

## 2018-05-19 NOTE — Telephone Encounter (Signed)
Pt is taking omeprazole 40mg  2xd and this has helped as the knot in throat is gone. Per Dr. Bonna Gains pt informed she recommended she have the procedure but if she would like to watch her symptons and let us know if any problem. Also decreased omeprazole to 20mg  daily, 30 min before meal #30, no RF's. Pt will make an appt for 4-8 f/u.

## 2018-05-19 NOTE — Addendum Note (Signed)
Addended by: Earl Lagos on: 05/19/2018 12:08 PM   Modules accepted: Orders

## 2018-05-24 ENCOUNTER — Ambulatory Visit: Admit: 2018-05-24 | Payer: Medicare HMO | Admitting: Gastroenterology

## 2018-05-24 SURGERY — MANOMETRY, ESOPHAGUS

## 2018-05-27 ENCOUNTER — Other Ambulatory Visit: Payer: Self-pay | Admitting: Internal Medicine

## 2018-06-05 ENCOUNTER — Other Ambulatory Visit: Payer: Self-pay | Admitting: Endocrinology

## 2018-06-14 ENCOUNTER — Ambulatory Visit
Admission: RE | Admit: 2018-06-14 | Discharge: 2018-06-14 | Disposition: A | Discharge status: Home / Self Care | Payer: Medicare HMO | Source: Ambulatory Visit | Attending: Internal Medicine | Admitting: Internal Medicine

## 2018-06-14 DIAGNOSIS — Z1231 Encounter for screening mammogram for malignant neoplasm of breast: Secondary | ICD-10-CM | POA: Diagnosis not present

## 2018-06-21 ENCOUNTER — Ambulatory Visit: Payer: Medicare HMO | Admitting: Gastroenterology

## 2018-06-21 ENCOUNTER — Encounter: Payer: Self-pay | Admitting: Gastroenterology

## 2018-06-21 ENCOUNTER — Other Ambulatory Visit (INDEPENDENT_AMBULATORY_CARE_PROVIDER_SITE_OTHER): Payer: Medicare HMO

## 2018-06-21 VITALS — BP 115/67 | HR 89 | Ht 64.0 in | Wt 153.0 lb

## 2018-06-21 DIAGNOSIS — E0590 Thyrotoxicosis, unspecified without thyrotoxic crisis or storm: Secondary | ICD-10-CM | POA: Diagnosis not present

## 2018-06-21 DIAGNOSIS — R1319 Other dysphagia: Secondary | ICD-10-CM

## 2018-06-21 DIAGNOSIS — R1310 Dysphagia, unspecified: Secondary | ICD-10-CM

## 2018-06-21 NOTE — Progress Notes (Signed)
Maria Antigua, MD [address]  [place]  [place], [place] [address]  Main: [phone]  Fax: [phone]   Primary Care Physician: Crecencio Mc, MD  Primary Gastroenterologist:  Dr. Vonda Hinton  Chief complaint: Follow-up of dysphagia  HPI: Maria Hinton is a 72 y.o. female here for follow-up of dysphagia.  Symptoms have completely resolved on PPI twice daily. The patient denies abdominal or flank pain, anorexia, nausea or vomiting, dysphagia, change in bowel habits or black or bloody stools or weight loss.   Previous history: Patient had an upper GI series that was normal. EGD done for dysphagia, showed esophageal mucosal changes suggestive of eosinophilic esophagitis. Z line at 39 cm. Salmon-colored mucosa suspicious for Barrett's. Erythematous antrum. Few gastric polyps. Small hiatal hernia. Biopsies showed no eosinophils in the distal or proximal esophagus. Reflux gastroesophagitis at the GE junction. Stomach polyp was fundic gland polyp. No H. pylori, and mild chronic gastritis was noted.  She was diagnosed with Graves' disease, and mild elevation in alk phos was thought to be likely due to thyroid disease versus changes in medications.  Alk phos was 153 at its highest, and on recent check on April 21, 2017 by primary care provider is completely normal.  This is consistent with alk phos likely being from thyroid disease versus medication changes, and normalization is also consistent with the same.  Patient also had fatty liver suspected on CT scan in November 2018, with normal liver enzymes otherwise at this time.  Patient is hepatitis B immune, and this was checked in November 2018.  Hep C antibody was negative at the time.  Ultrasound in May 2019 was normal liver.  Last colonoscopy in 2016 by Dr. Candace Cruise, no polyps reported, repeat recommended in 5 years.  Current Outpatient Medications  Medication Sig Dispense Refill  . albuterol (PROVENTIL  HFA;VENTOLIN HFA) 108 (90 Base) MCG/ACT inhaler Inhale 2 puffs into the lungs every 6 (six) hours as needed for wheezing. 6.7 g 11  . ALPRAZolam (XANAX) 0.25 MG tablet TAKE 1 TABLET BY MOUTH TWICE A DAY AS NEEDED 60 tablet 2  . aspirin EC 81 MG tablet Take 81 mg by mouth daily.    Marland Kitchen atenolol (TENORMIN) 50 MG tablet TAKE 1 TABLET (50 MG TOTAL) BY MOUTH 2 (TWO) TIMES DAILY. 180 tablet 3  . atorvastatin (LIPITOR) 40 MG tablet TAKE 1 TABLET BY MOUTH DAILY AT 6 PM. 90 tablet 3  . atorvastatin (LIPITOR) 40 MG tablet TAKE 1 TABLET BY MOUTH DAILY AT 6 PM. 90 tablet 3  . glucose blood (ONETOUCH VERIO) test strip Use as instructed 100 each 12  . hydrochlorothiazide (HYDRODIURIL) 12.5 MG tablet TAKE 1 TABLET (12.5 MG TOTAL) BY MOUTH DAILY. 90 tablet 3  . hydrochlorothiazide (MICROZIDE) 12.5 MG capsule Take 12.5 mg by mouth daily.    . Insulin Detemir (LEVEMIR FLEXPEN) 100 UNIT/ML Pen Inject 20 Units into the skin daily at 10 pm. 15 mL 11  . Insulin Pen Needle (BD PEN NEEDLE NANO U/F) 32G X 4 MM MISC USE DAILY AS DIRECTED with levemir 100 each 1  . Lancets (ONETOUCH ULTRASOFT) lancets Use as instructed 100 each 12  . lisinopril (PRINIVIL,ZESTRIL) 10 MG tablet TAKE 1 TABLET BY MOUTH DAILY 90 tablet 1  . loperamide (IMODIUM A-D) 2 MG tablet Take 1 tablet (2 mg total) by mouth 4 (four) times daily as needed for diarrhea or loose stools. 30 tablet 1  . metFORMIN (GLUCOPHAGE) 1000 MG tablet TAKE 1 TABLET BY  MOUTH 2 TIMES DAILY WITH A MEAL. 180 tablet 3  . methimazole (TAPAZOLE) 5 MG tablet TAKE 1 TABLET BY MOUTH EVERY DAY 90 tablet 0  . omeprazole (PRILOSEC) 20 MG capsule Take 1 capsule (20 mg total) by mouth daily. Take 30 min. Before meal. 30 capsule 1  . ondansetron (ZOFRAN ODT) 4 MG disintegrating tablet Take 1 tablet (4 mg total) by mouth every 8 (eight) hours as needed for nausea or vomiting. 30 tablet 1  . traMADol (ULTRAM) 50 MG tablet TAKE 1 TABLET BY MOUTH EVERY 6 HOURS AS NEEDED FOR SPINAL STENOSIS PAIN  120 tablet 5   No current facility-administered medications for this visit.     Allergies as of 06/21/2018 - Review Complete 05/09/2018  Allergen Reaction Noted  . Vicodin [hydrocodone-acetaminophen] Other (See Comments) 09/08/2011    ROS:  General: Negative for anorexia, weight loss, fever, chills, fatigue, weakness. ENT: Negative for hoarseness, difficulty swallowing , nasal congestion. CV: Negative for chest pain, angina, palpitations, dyspnea on exertion, peripheral edema.  Respiratory: Negative for dyspnea at rest, dyspnea on exertion, cough, sputum, wheezing.  GI: See history of present illness. GU:  Negative for dysuria, hematuria, urinary incontinence, urinary frequency, nocturnal urination.  Endo: Negative for unusual weight change.    Physical Examination:   There were no vitals taken for this visit.  General: Well-nourished, well-developed in no acute distress.  Eyes: No icterus. Conjunctivae pink. Mouth: Oropharyngeal mucosa moist and pink , no lesions erythema or exudate. Neck: Supple, Trachea midline Abdomen: Bowel sounds are normal, nontender, nondistended, no hepatosplenomegaly or masses, no abdominal bruits or hernia , no rebound or guarding.   Extremities: No lower extremity edema. No clubbing or deformities. Neuro: Alert and oriented x 3.  Grossly intact. Skin: Warm and dry, no jaundice.   Psych: Alert and cooperative, normal mood and affect.   Labs: CMP     Component Value Date/Time   NA 139 05/05/2018 1051   NA 141 11/30/2012 0545   K 4.9 05/05/2018 1051   K 4.1 11/30/2012 0545   CL 102 05/05/2018 1051   CL 108 (H) 11/30/2012 0545   CO2 29 05/05/2018 1051   CO2 26 11/30/2012 0545   GLUCOSE 233 (H) 05/05/2018 1051   GLUCOSE 191 (H) 11/30/2012 0545   BUN 20 05/05/2018 1051   BUN 22 (H) 11/30/2012 0545   CREATININE 0.95 05/05/2018 1051   CREATININE 1.10 11/30/2012 0545   CALCIUM 8.7 05/05/2018 1051   CALCIUM 7.9 (L) 11/30/2012 0545   PROT 5.9  (L) 05/05/2018 1051   PROT 6.0 (L) 04/22/2012 0701   ALBUMIN 3.5 05/05/2018 1051   ALBUMIN 1.9 (L) 04/22/2012 0701   AST 15 05/05/2018 1051   AST 11 (L) 04/22/2012 0701   ALT 17 05/05/2018 1051   ALT 13 04/22/2012 0701   ALKPHOS 107 05/05/2018 1051   ALKPHOS 129 04/22/2012 0701   BILITOT 0.6 05/05/2018 1051   BILITOT 0.4 04/22/2012 0701   GFRNONAA 50 (L) 02/17/2018 1207   GFRNONAA 53 (L) 11/30/2012 0545   GFRAA 58 (L) 02/17/2018 1207   GFRAA >60 11/30/2012 0545   Lab Results  Component Value Date   WBC 6.4 04/21/2018   HGB 14.5 04/21/2018   HCT 42.7 04/21/2018   MCV 88.2 04/21/2018   PLT 324.0 04/21/2018    Imaging Studies: Mm 3d Screen Breast Bilateral  Result Date: 06/14/2018 CLINICAL DATA:  Screening. EXAM: DIGITAL SCREENING BILATERAL MAMMOGRAM WITH TOMO AND CAD COMPARISON:  Previous exam(s). ACR Breast Density Category  b: There are scattered areas of fibroglandular density. FINDINGS: There are no findings suspicious for malignancy. Images were processed with CAD. IMPRESSION: No mammographic evidence of malignancy. A result letter of this screening mammogram will be mailed directly to the patient. RECOMMENDATION: Screening mammogram in one year. (Code:SM-B-01Y) BI-RADS CATEGORY  1: Negative. Electronically Signed   By: Ammie Ferrier M.D.   On: 06/14/2018 13:45    Assessment and Plan:   ASHLEN KIGER is a 72 y.o. y/o female here for follow-up of dysphagia  Dysphagia has resolved Negative upper GI series EGD with biopsies did not show eosinophilic esophagitis However, the biopsies were done in the setting of PPI use and could have affected biopsy results as endoscopic appearance is reported to suggest eosinophilic esophagitis. Due to ongoing symptoms patient was started on twice daily PPI on last visit and this has completely resolved her symptoms.  She states within 1 week of starting twice daily therapy her symptoms resolved. Patient would like to continue on twice  daily PPI at this time given complete resolution of her symptoms and she states she is feeling well compared to when she was on once daily PPI. After twice daily therapy for 1 to 2 months, she will go back to once daily medication ((Risks of PPI use were discussed with patient including bone loss, C. Diff diarrhea, pneumonia, infections, CKD, electrolyte abnormalities.  If clinically possible based on symptoms, goal would be to maintain patient on the lowest dose possible, or discontinue the medication with institution of acid reflux lifestyle modifications over time. Pt. Verbalizes understanding and chooses to continue the medication.)   Next colonoscopy due in 2021  Elevated alk phos not completely and likely due to thyroid dysfunction in the past  fatty liver seen on CT not present on ultrasound and transaminases normal Patient after his BMU Follow-up with primary care provider for hep A vaccination if not immune Continue diet, weight loss and exercise   Dr Maria Hinton

## 2018-06-21 NOTE — Addendum Note (Signed)
Addended by: Leeanne Rio on: 06/21/2018 11:20 AM   Modules accepted: Orders

## 2018-06-22 LAB — T4, FREE: Free T4: 3.17 ng/dL — ABNORMAL HIGH (ref 0.82–1.77)

## 2018-06-22 LAB — T3, FREE: T3, Free: 8.4 pg/mL — ABNORMAL HIGH (ref 2.0–4.4)

## 2018-06-22 LAB — THYROTROPIN RECEPTOR AUTOABS: Thyrotropin Receptor Ab: 2.05 IU/L — ABNORMAL HIGH (ref 0.00–1.75)

## 2018-06-22 LAB — TSH: TSH: <0.006 u[IU]/mL — ABNORMAL LOW (ref 0.450–4.500)

## 2018-06-27 ENCOUNTER — Ambulatory Visit: Payer: Medicare HMO | Admitting: Endocrinology

## 2018-06-27 ENCOUNTER — Encounter: Payer: Self-pay | Admitting: Endocrinology

## 2018-06-27 VITALS — BP 132/76 | HR 76 | Ht 64.0 in | Wt 151.6 lb

## 2018-06-27 DIAGNOSIS — E0590 Thyrotoxicosis, unspecified without thyrotoxic crisis or storm: Secondary | ICD-10-CM

## 2018-06-27 MED ORDER — METHIMAZOLE 10 MG PO TABS: 10.0000 mg | ORAL_TABLET | Freq: Two times a day (BID) | ORAL | 3 refills | Status: DC

## 2018-06-27 NOTE — Progress Notes (Signed)
Patient ID: Maria Hinton, female   DOB: [birth year], 72 y.o.   MRN: [id number]                                                                                                              Reason for Appointment:  Hyperthyroidism, follow-up visit   Referring physician: Derrel Nip   History of Present Illness:   Prior history:  For several months before her consultation she had symptoms of weakness, palpitations, shakiness, feeling sweaty in am, nervousness, and fatigue.  Also would feel a strong heartbeat in her ears She had been on atenolol for hypertension also She had a decreased appetite and started losing weight late last summer  The patient had lost about  26 lbs since these symptoms started   RECENT history:  She is taking methimazole since 3/19 for her hyperthyroidism On her 10/2017 visit she was taking 10 mg twice daily because of hyperthyroidism she was given 30 mg daily  In follow-up she has been requiring periodically reduced doses of her methimazole  Since 7/19 she has been taking 7.5 mg daily Previously had been told to take 5 mg daily but she thinks she has been on 1-1/2 pills daily  Since her GI virus in October she has been feeling weak She says that now she has very difficult time moving around because of weakness and not able to do much She feels warm and does not use a jacket in the cold weather She is usually not having palpitations or shakiness, occasionally may feel nervous  Her weight is about the same and her appetite is still relatively low  She continues to take atenolol 50 mg which she has been taking for several years for hypertension  Wt Readings from Last 3 Encounters:  06/27/18 151 lb 9.6 oz (68.8 kg)  06/21/18 153 lb (69.4 kg)  05/09/18 150 lb (68 kg)     Thyroid levels are markedly increased, both free T3 and free T4 with suppressed TSH  However her thyrotropin receptor antibody is only mildly increased and improved from before  Thyroid  function tests as follows:     Lab Results  Component Value Date   FREET4 3.17 (H) 06/21/2018   FREET4 1.13 05/05/2018   FREET4 0.94 02/28/2018   T3FREE 8.4 (H) 06/21/2018   T3FREE 3.7 05/05/2018   T3FREE 3.4 02/28/2018   TSH <0.006 (L) 06/21/2018   TSH <0.01 Repeated and verified X2. (L) 05/05/2018   TSH 2.13 02/28/2018    Lab Results  Component Value Date   THYROTRECAB 2.05 (H) 06/21/2018   THYROTRECAB 2.79 (H) 02/28/2018   THYROTRECAB 3.58 (H) 11/18/2017     Allergies as of 06/27/2018      Reactions   Vicodin [hydrocodone-acetaminophen] Other (See Comments)   Insomnia       Medication List       Accurate as of June 27, 2018  1:16 PM. Always use your most recent med list.        albuterol 108 (90 Base) MCG/ACT inhaler Commonly  known as:  PROVENTIL HFA;VENTOLIN HFA Inhale 2 puffs into the lungs every 6 (six) hours as needed for wheezing.   ALPRAZolam 0.25 MG tablet Commonly known as:  XANAX TAKE 1 TABLET BY MOUTH TWICE A DAY AS NEEDED   aspirin EC 81 MG tablet Take 81 mg by mouth daily.   atenolol 50 MG tablet Commonly known as:  TENORMIN TAKE 1 TABLET (50 MG TOTAL) BY MOUTH 2 (TWO) TIMES DAILY.   atorvastatin 40 MG tablet Commonly known as:  LIPITOR TAKE 1 TABLET BY MOUTH DAILY AT 6 PM.   glucose blood test strip Commonly known as:  ONETOUCH VERIO Use as instructed   hydrochlorothiazide 12.5 MG tablet Commonly known as:  HYDRODIURIL TAKE 1 TABLET (12.5 MG TOTAL) BY MOUTH DAILY.   Insulin Detemir 100 UNIT/ML Pen Commonly known as:  LEVEMIR FLEXPEN Inject 20 Units into the skin daily at 10 pm.   Insulin Pen Needle 32G X 4 MM Misc Commonly known as:  BD PEN NEEDLE NANO U/F USE DAILY AS DIRECTED with levemir   lisinopril 10 MG tablet Commonly known as:  PRINIVIL,ZESTRIL TAKE 1 TABLET BY MOUTH DAILY   loperamide 2 MG tablet Commonly known as:  IMODIUM A-D Take 1 tablet (2 mg total) by mouth 4 (four) times daily as needed for diarrhea or  loose stools.   metFORMIN 1000 MG tablet Commonly known as:  GLUCOPHAGE TAKE 1 TABLET BY MOUTH 2 TIMES DAILY WITH A MEAL.   methimazole 5 MG tablet Commonly known as:  TAPAZOLE TAKE 1 TABLET BY MOUTH EVERY DAY   omeprazole 20 MG capsule Commonly known as:  PRILOSEC Take 1 capsule (20 mg total) by mouth daily. Take 30 min. Before meal.   ondansetron 4 MG disintegrating tablet Commonly known as:  ZOFRAN ODT Take 1 tablet (4 mg total) by mouth every 8 (eight) hours as needed for nausea or vomiting.   onetouch ultrasoft lancets Use as instructed   traMADol 50 MG tablet Commonly known as:  ULTRAM TAKE 1 TABLET BY MOUTH EVERY 6 HOURS AS NEEDED FOR SPINAL STENOSIS PAIN           Past Medical History:  Diagnosis Date  . Actinic keratoses    follows with dermatology   . Arthritis   . Asthma   . Chronic back pain    DDD,herniated disc/spondylosis/radiculopathy  . COPD (chronic obstructive pulmonary disease) ([place])   . Diabetes mellitus    1980s  . Early cataracts, bilateral    Dr. Ruthine Dose - [facility]  . GERD (gastroesophageal reflux disease)    takes Omeprazole daily  . Hemorrhoids   . History of colonic polyps    colonoscopy 04/2012 - Dr Candace Cruise  . Hyperlipidemia    takes Pravastatin daily  . Hypertension    takes Metoprolol/HCTZ daily  . Insomnia   . Joint pain    fingers  . Joint swelling   . Pneumonia    x 2 ;in Dec 2010/2011  . PONV (postoperative nausea and vomiting)   . Right leg weakness     Past Surgical History:  Procedure Laterality Date  . ABDOMINAL HYSTERECTOMY     at age 41  . BACK SURGERY  20+yrs ago  . COLONOSCOPY    . COLONOSCOPY N/A 05/13/2015   Procedure: COLONOSCOPY;  Surgeon: Hulen Luster, MD;  Location: [facility] ENDOSCOPY;  Service: Gastroenterology;  Laterality: N/A;  . ESOPHAGOGASTRODUODENOSCOPY  2011  . ESOPHAGOGASTRODUODENOSCOPY (EGD) WITH PROPOFOL N/A 09/02/2017   Procedure: ESOPHAGOGASTRODUODENOSCOPY (EGD)  WITH PROPOFOL;  Surgeon:  Virgel Manifold, MD;  Location: [facility] ENDOSCOPY;  Service: Endoscopy;  Laterality: N/A;  . FOOT SURGERY  2011   bunionectomy and knot removed from bottom of both feet  . JOINT REPLACEMENT Left 2014   hip  Kraskinski  . LUMBAR LAMINECTOMY/DECOMPRESSION MICRODISCECTOMY  09/10/2011   Procedure: LUMBAR LAMINECTOMY/DECOMPRESSION MICRODISCECTOMY 1 LEVEL;  Surgeon: Hosie Spangle, MD;  Location: [facility]URO ORS;  Service: Neurosurgery;  Laterality: Right;  RIGHT Lumbar  Laminotomy and microdiskectomy Lumbar Four-Five  . LUMBAR LAMINECTOMY/DECOMPRESSION MICRODISCECTOMY N/A 06/26/2015   Procedure: LUMBAR LAMINECTOMY/DECOMPRESSION MICRODISCECTOMY 1 LEVEL;  Surgeon: Jovita Gamma, MD;  Location: [facility];  Service: Neurosurgery;  Laterality: N/A;  L3 and L4 Laminectomies  . MELANOMA EXCISION  2011   on nose/back on neck  . THORACIC DISC SURGERY  at age 66 and 5    mass of veins that had gotten into muscle and wrapped around rib;3 ribs also removed from left side  . TONSILLECTOMY  as a child   and adenoids   . TOTAL HIP ARTHROPLASTY Left 2014   Dr. Mack Guise  . TUBAL LIGATION    . VAGINA SURGERY     vaginal wall ruptured     Family History  Problem Relation Age of Onset  . Heart disease Brother        valve replacement  . COPD Brother   . COPD Mother        Emphysema  . Diabetes Mother   . Other Mother        Died in house fire at age 28.  Marland Kitchen Heart disease Father   . Seizures Father   . Cancer Father        Lung cancer with brain metastasis  . Asthma Brother   . COPD Son 73  . Cancer Other        aunt ? maternal vs paternal side lung cancer   . Diabetes Maternal Grandmother   . Anesthesia problems Neg Hx   . Hypotension Neg Hx   . Malignant hyperthermia Neg Hx   . Pseudochol deficiency Neg Hx   . Thyroid disease Neg Hx   . Breast cancer Neg Hx     Social History:  reports that she quit smoking about 1 years ago. Her smoking use included cigarettes. She has a 5.00  pack-year smoking history. She has never used smokeless tobacco. She reports that she does not drink alcohol or use drugs.  Allergies:  Allergies  Allergen Reactions  . Vicodin [Hydrocodone-Acetaminophen] Other (See Comments)    Insomnia      Review of Systems  She is on insulin for diabetes and followed by PCP  Lab Results  Component Value Date   HGBA1C 6.4 (A) 04/11/2018   HGBA1C 6.4 (A) 02/25/2018   HGBA1C 7.0 (H) 10/05/2017   Lab Results  Component Value Date   MICROALBUR 1.6 07/07/2017   LDLCALC 53 10/05/2017   CREATININE 0.95 05/05/2018       Examination:   BP 132/76 (BP Location: Left Arm, Patient Position: Sitting, Cuff Size: Normal)   Pulse 76   Ht 5\' 4"  (1.626 m)   Wt 151 lb 9.6 oz (68.8 kg)   SpO2 97%   BMI 26.02 kg/m   Thyroid minimally enlarged, smooth on the right side, left side not palpable No clear-cut tremor Hands are warm  Biceps reflexes show slightly brisk relaxation   Assessment/Plan:   Hyperthyroidism, diagnosed to be from Graves' disease, on treatment since  09/2017  Although she had normal thyroid levels with relatively lower doses of methimazole, most recently 7.5 mg her thyroid levels are markedly increased now  She is not having recurrence of her original hyperthyroid symptoms but is complaining of feeling weak, tired and sleepy Also has some heat intolerance  Since her free T4 and T3 levels are both markedly increased will need to go back up on her methimazole dose to 20 mg for now She will need to be seen back in a month If she has difficulty getting remission with methimazole will consider I-131 treatment and this was discussed To stay on current Tylenol which she takes for hypertension  Elayne Snare 06/27/2018, 1:16 PM    Note: This office note was prepared with Dragon voice recognition system technology. Any transcriptional errors that result from this process are unintentional.

## 2018-06-27 NOTE — Patient Instructions (Signed)
Take 2 pills 2x daily

## 2018-07-20 ENCOUNTER — Other Ambulatory Visit (INDEPENDENT_AMBULATORY_CARE_PROVIDER_SITE_OTHER): Payer: Medicare HMO

## 2018-07-20 DIAGNOSIS — E0590 Thyrotoxicosis, unspecified without thyrotoxic crisis or storm: Secondary | ICD-10-CM

## 2018-07-20 LAB — T3, FREE: T3, Free: 3.3 pg/mL (ref 2.3–4.2)

## 2018-07-20 LAB — TSH: TSH: <0.01 u[IU]/mL — ABNORMAL LOW (ref 0.35–4.50)

## 2018-07-20 LAB — T4, FREE: Free T4: 1.03 ng/dL (ref 0.60–1.60)

## 2018-07-27 ENCOUNTER — Ambulatory Visit: Payer: Medicare HMO | Admitting: Endocrinology

## 2018-07-27 ENCOUNTER — Encounter: Payer: Self-pay | Admitting: Endocrinology

## 2018-07-27 VITALS — BP 130/76 | HR 69 | Ht 64.0 in | Wt 153.8 lb

## 2018-07-27 DIAGNOSIS — E0590 Thyrotoxicosis, unspecified without thyrotoxic crisis or storm: Secondary | ICD-10-CM | POA: Diagnosis not present

## 2018-07-27 NOTE — Progress Notes (Signed)
Patient ID: Maria Hinton, female   DOB: 19-Apr-1946, 73 y.o.   MRN: [id number]                                                                                                              Reason for Appointment:  Hyperthyroidism, follow-up visit   Referring physician: Derrel Nip   History of Present Illness:   Prior history:  For several months before her consultation she had symptoms of weakness, palpitations, shakiness, feeling sweaty in am, nervousness, and fatigue.  Also would feel a strong heartbeat in her ears She had been on atenolol for hypertension also She had a decreased appetite and started losing weight late last summer  The patient had lost about  26 lbs since these symptoms started   RECENT history:  She is taking methimazole since 3/19 for her hyperthyroidism On her 10/2017 visit she was taking 10 mg twice daily because of hyperthyroidism she was given 30 mg daily  In follow-up she has been requiring periodically reduced doses of her methimazole  However since late 2019 she appears to be needing progressively higher doses of methimazole  On her last visit in 12/19 she was having some fatigue and weakness although did not complain of feeling shaky or having palpitations Also had no weight loss However her thyroid levels were significantly high with 7.5 mg of methimazole  She is now taking 20 mg of methimazole in divided doses  She continues to take atenolol 50 mg which she has been taking for several years for hypertension  She is feeling less tired, no heat or cold intolerance Her weight is about the same or possibly up slightly  Wt Readings from Last 3 Encounters:  07/27/18 153 lb 12.8 oz (69.8 kg)  06/27/18 151 lb 9.6 oz (68.8 kg)  06/21/18 153 lb (69.4 kg)     Thyroid levels are back to normal although TSH is still suppressed  Last thyrotropin receptor antibody is only mildly increased and improved from before  Thyroid function tests as follows:     Lab  Results  Component Value Date   FREET4 1.03 07/20/2018   FREET4 3.17 (H) 06/21/2018   FREET4 1.13 05/05/2018   T3FREE 3.3 07/20/2018   T3FREE 8.4 (H) 06/21/2018   T3FREE 3.7 05/05/2018   TSH <0.01 (L) 07/20/2018   TSH <0.006 (L) 06/21/2018   TSH <0.01 Repeated and verified X2. (L) 05/05/2018    Lab Results  Component Value Date   THYROTRECAB 2.05 (H) 06/21/2018   THYROTRECAB 2.79 (H) 02/28/2018   THYROTRECAB 3.58 (H) 11/18/2017     Allergies as of 07/27/2018      Reactions   Vicodin [hydrocodone-acetaminophen] Other (See Comments)   Insomnia       Medication List       Accurate as of July 27, 2018  1:20 PM. Always use your most recent med list.        albuterol 108 (90 Base) MCG/ACT inhaler Commonly known as:  PROVENTIL HFA;VENTOLIN HFA Inhale 2 puffs into the lungs  every 6 (six) hours as needed for wheezing.   ALPRAZolam 0.25 MG tablet Commonly known as:  XANAX TAKE 1 TABLET BY MOUTH TWICE A DAY AS NEEDED   aspirin EC 81 MG tablet Take 81 mg by mouth daily.   atenolol 50 MG tablet Commonly known as:  TENORMIN TAKE 1 TABLET (50 MG TOTAL) BY MOUTH 2 (TWO) TIMES DAILY.   atorvastatin 40 MG tablet Commonly known as:  LIPITOR TAKE 1 TABLET BY MOUTH DAILY AT 6 PM.   glucose blood test strip Commonly known as:  ONETOUCH VERIO Use as instructed   hydrochlorothiazide 12.5 MG tablet Commonly known as:  HYDRODIURIL TAKE 1 TABLET (12.5 MG TOTAL) BY MOUTH DAILY.   Insulin Detemir 100 UNIT/ML Pen Commonly known as:  LEVEMIR FLEXPEN Inject 20 Units into the skin daily at 10 pm.   Insulin Pen Needle 32G X 4 MM Misc Commonly known as:  BD PEN NEEDLE NANO U/F USE DAILY AS DIRECTED with levemir   lisinopril 10 MG tablet Commonly known as:  PRINIVIL,ZESTRIL TAKE 1 TABLET BY MOUTH DAILY   metFORMIN 1000 MG tablet Commonly known as:  GLUCOPHAGE TAKE 1 TABLET BY MOUTH 2 TIMES DAILY WITH A MEAL.   methimazole 10 MG tablet Commonly known as:  TAPAZOLE Take 1  tablet (10 mg total) by mouth 2 (two) times daily.   omeprazole 20 MG capsule Commonly known as:  PRILOSEC Take 1 capsule (20 mg total) by mouth daily. Take 30 min. Before meal.   onetouch ultrasoft lancets Use as instructed   traMADol 50 MG tablet Commonly known as:  ULTRAM TAKE 1 TABLET BY MOUTH EVERY 6 HOURS AS NEEDED FOR SPINAL STENOSIS PAIN           Past Medical History:  Diagnosis Date  . Actinic keratoses    follows with dermatology   . Arthritis   . Asthma   . Chronic back pain    DDD,herniated disc/spondylosis/radiculopathy  . COPD (chronic obstructive pulmonary disease) ([place])   . Diabetes mellitus    1980s  . Early cataracts, bilateral    Dr. Ruthine Dose - [facility]  . GERD (gastroesophageal reflux disease)    takes Omeprazole daily  . Hemorrhoids   . History of colonic polyps    colonoscopy 04/2012 - Dr Candace Cruise  . Hyperlipidemia    takes Pravastatin daily  . Hypertension    takes Metoprolol/HCTZ daily  . Insomnia   . Joint pain    fingers  . Joint swelling   . Pneumonia    x 2 ;in Dec 2010/2011  . PONV (postoperative nausea and vomiting)   . Right leg weakness     Past Surgical History:  Procedure Laterality Date  . ABDOMINAL HYSTERECTOMY     at age 66  . BACK SURGERY  20+yrs ago  . COLONOSCOPY    . COLONOSCOPY N/A 05/13/2015   Procedure: COLONOSCOPY;  Surgeon: Hulen Luster, MD;  Location: [facility] ENDOSCOPY;  Service: Gastroenterology;  Laterality: N/A;  . ESOPHAGOGASTRODUODENOSCOPY  2011  . ESOPHAGOGASTRODUODENOSCOPY (EGD) WITH PROPOFOL N/A 09/02/2017   Procedure: ESOPHAGOGASTRODUODENOSCOPY (EGD) WITH PROPOFOL;  Surgeon: Virgel Manifold, MD;  Location: [facility] ENDOSCOPY;  Service: Endoscopy;  Laterality: N/A;  . FOOT SURGERY  2011   bunionectomy and knot removed from bottom of both feet  . JOINT REPLACEMENT Left 2014   hip  Kraskinski  . LUMBAR LAMINECTOMY/DECOMPRESSION MICRODISCECTOMY  09/10/2011   Procedure: LUMBAR LAMINECTOMY/DECOMPRESSION  MICRODISCECTOMY 1 LEVEL;  Surgeon: Hosie Spangle, MD;  Location: [place] NEURO ORS;  Service: Neurosurgery;  Laterality: Right;  RIGHT Lumbar  Laminotomy and microdiskectomy Lumbar Four-Five  . LUMBAR LAMINECTOMY/DECOMPRESSION MICRODISCECTOMY N/A 06/26/2015   Procedure: LUMBAR LAMINECTOMY/DECOMPRESSION MICRODISCECTOMY 1 LEVEL;  Surgeon: Jovita Gamma, MD;  Location: [place] NEURO ORS;  Service: Neurosurgery;  Laterality: N/A;  L3 and L4 Laminectomies  . MELANOMA EXCISION  2011   on nose/back on neck  . THORACIC DISC SURGERY  at age 48 and 62    mass of veins that had gotten into muscle and wrapped around rib;3 ribs also removed from left side  . TONSILLECTOMY  as a child   and adenoids   . TOTAL HIP ARTHROPLASTY Left 2014   Dr. Mack Guise  . TUBAL LIGATION    . VAGINA SURGERY     vaginal wall ruptured     Family History  Problem Relation Age of Onset  . Heart disease Brother        valve replacement  . COPD Brother   . COPD Mother        Emphysema  . Diabetes Mother   . Other Mother        Died in house fire at age 59.  Marland Kitchen Heart disease Father   . Seizures Father   . Cancer Father        Lung cancer with brain metastasis  . Asthma Brother   . COPD Son 54  . Cancer Other        aunt ? maternal vs paternal side lung cancer   . Diabetes Maternal Grandmother   . Anesthesia problems Neg Hx   . Hypotension Neg Hx   . Malignant hyperthermia Neg Hx   . Pseudochol deficiency Neg Hx   . Thyroid disease Neg Hx   . Breast cancer Neg Hx     Social History:  reports that she quit smoking about 2 years ago. Her smoking use included cigarettes. She has a 5.00 pack-year smoking history. She has never used smokeless tobacco. She reports that she does not drink alcohol or use drugs.  Allergies:  Allergies  Allergen Reactions  . Vicodin [Hydrocodone-Acetaminophen] Other (See Comments)    Insomnia      Review of Systems  She is on insulin for diabetes and followed by PCP  Lab Results    Component Value Date   HGBA1C 6.4 (A) 04/11/2018   HGBA1C 6.4 (A) 02/25/2018   HGBA1C 7.0 (H) 10/05/2017   Lab Results  Component Value Date   MICROALBUR 1.6 07/07/2017   LDLCALC 53 10/05/2017   CREATININE 0.95 05/05/2018       Examination:   BP 130/76 (BP Location: Left Arm, Patient Position: Sitting, Cuff Size: Normal)   Pulse 69   Ht 5\' 4"  (1.626 m)   Wt 153 lb 12.8 oz (69.8 kg)   SpO2 97%   BMI 26.40 kg/m   No tremor present Biceps reflexes appear to be normal No diaphoresis of her hands and no peripheral edema   Assessment/Plan:   Hyperthyroidism, diagnosed to be from Graves' disease, on treatment since 09/2017  Although she had normal thyroid levels with relatively lower doses of methimazole, most recently she has needed a higher dose with going up to 20 mg a day of the methimazole  With this she is symptomatically better and also her thyroid levels are quite normal TSH still suppressed Discussed that she may or may not be able to get into remission with methimazole and this will be determined over the next couple  of visits Discussed possibility of I-131 treatment  Since her thyroid levels are just normal she will maintain her dose of 20 mg a day for the next 6 weeks or so She will call if she has any worsening fatigue or other symptoms To recheck thyrotropin receptor antibody also on the next visit  Elayne Snare 07/27/2018, 1:20 PM    Note: This office note was prepared with Dragon voice recognition system technology. Any transcriptional errors that result from this process are unintentional.

## 2018-08-03 ENCOUNTER — Other Ambulatory Visit: Payer: Self-pay | Admitting: Internal Medicine

## 2018-08-04 NOTE — Telephone Encounter (Signed)
Refilled: 04/28/2018 Last OV: 04/11/2018 Next OV: 10/11/2018

## 2018-08-16 ENCOUNTER — Telehealth: Payer: Self-pay | Admitting: Internal Medicine

## 2018-08-16 NOTE — Telephone Encounter (Signed)
Copied from [place]. Topic: Quick Communication - Rx Refill/Question >> Aug 16, 2018  2:30 PM Wynetta Emery, [place] C wrote: Medication: lidocaine (cardiac) 100 mg/75ml (XYLOCAINE) 20 MG/ML injection 2%   Has the patient contacted their pharmacy? Yes--   (Agent: If no, request that the patient contact the pharmacy for the refill.) (Agent: If yes, when and what did the pharmacy advise?)  Preferred Pharmacy (with phone number or street name): [facility] - Phone: [phone]  Fax: [phone]   Agent: Please be advised that RX refills may take up to 3 business days. We ask that you follow-up with your pharmacy.

## 2018-08-16 NOTE — Telephone Encounter (Signed)
I do not see this in the pt's medication list.

## 2018-08-16 NOTE — Telephone Encounter (Signed)
Do not fill injectible lidocaine.  That is not a patient refillable med

## 2018-09-02 ENCOUNTER — Ambulatory Visit (INDEPENDENT_AMBULATORY_CARE_PROVIDER_SITE_OTHER): Payer: Medicare HMO

## 2018-09-02 VITALS — BP 118/64 | HR 62 | Temp 98.1°F | Resp 14 | Ht 64.5 in | Wt 152.1 lb

## 2018-09-02 DIAGNOSIS — Z0000 Encounter for general adult medical examination without abnormal findings: Secondary | ICD-10-CM

## 2018-09-02 NOTE — Progress Notes (Signed)
Subjective:   Maria Hinton is a 73 y.o. female who presents for Medicare Annual (Subsequent) preventive examination.  Review of Systems:  No ROS.  Medicare Wellness Visit. Additional risk factors are reflected in the social history. Cardiac Risk Factors include: advanced age (>55men, >43 women);diabetes mellitus;hypertension     Objective:     Vitals: BP 118/64 (BP Location: Left Arm, Patient Position: Sitting, Cuff Size: Normal)   Pulse 62   Temp 98.1 F (36.7 C) (Oral)   Resp 14   Ht 5' 4.5" (1.638 m)   Wt 152 lb 1.9 oz (69 kg)   SpO2 98%   BMI 25.71 kg/m   Body mass index is 25.71 kg/m.  Advanced Directives 09/02/2018 02/17/2018 09/02/2017 01/30/2016 06/26/2015 06/20/2015 09/08/2011  Does Patient Have a Medical Advance Directive? Yes No Yes Yes Yes Yes Patient has advance directive, copy not in chart  Type of Advance Directive [place];Living will - [place];Living will [place];Living will [place];Living will [place];Living will [place];Living will  Does patient want to make changes to medical advance directive? No - Patient declined - - - No - Patient declined No - Patient declined -  Copy of [place] in Chart? No - copy requested - - No - copy requested No - copy requested No - copy requested -  Pre-existing out of facility DNR order (yellow form or pink MOST form) - - - - - - No    Tobacco Social History   Tobacco Use  Smoking Status Former Smoker  . Packs/day: 0.25  . Years: 20.00  . Pack years: 5.00  . Types: Cigarettes  . Last attempt to quit: 2018  . Years since quitting: 2.1  Smokeless Tobacco Never Used  Tobacco Comment   Pt quit 11/08/2014.Pt had quit smoking for 13 years before starting back in 2010; per 05/2017 visit quit smoking 2017 smoked total 25 years 1 ppd      Counseling given: Not Answered Comment: Pt quit  11/08/2014.Pt had quit smoking for 13 years before starting back in 2010; per 05/2017 visit quit smoking 2017 smoked total 25 years 1 ppd    Clinical Intake:  Pre-visit preparation completed: Yes        Diabetes: Yes(Followed by pcp)  How often do you need to have someone help you when you read instructions, pamphlets, or other written materials from your doctor or pharmacy?: 1 - Never  Interpreter Needed?: No     Past Medical History:  Diagnosis Date  . Actinic keratoses    follows with dermatology   . Arthritis   . Asthma   . Chronic back pain    DDD,herniated disc/spondylosis/radiculopathy  . COPD (chronic obstructive pulmonary disease) ([place])   . Diabetes mellitus    1980s  . Early cataracts, bilateral    Dr. Ruthine Dose - [facility]  . GERD (gastroesophageal reflux disease)    takes Omeprazole daily  . Hemorrhoids   . History of colonic polyps    colonoscopy 04/2012 - Dr Candace Cruise  . Hyperlipidemia    takes Pravastatin daily  . Hypertension    takes Metoprolol/HCTZ daily  . Insomnia   . Joint pain    fingers  . Joint swelling   . Pneumonia    x 2 ;in Dec 2010/2011  . PONV (postoperative nausea and vomiting)   . Right leg weakness    Past Surgical History:  Procedure Laterality Date  . ABDOMINAL HYSTERECTOMY     at age 54  . BACK SURGERY  20+yrs ago  . COLONOSCOPY    . COLONOSCOPY N/A 05/13/2015   Procedure: COLONOSCOPY;  Surgeon: Hulen Luster, MD;  Location: [facility] ENDOSCOPY;  Service: Gastroenterology;  Laterality: N/A;  . ESOPHAGOGASTRODUODENOSCOPY  2011  . ESOPHAGOGASTRODUODENOSCOPY (EGD) WITH PROPOFOL N/A 09/02/2017   Procedure: ESOPHAGOGASTRODUODENOSCOPY (EGD) WITH PROPOFOL;  Surgeon: Virgel Manifold, MD;  Location: [facility] ENDOSCOPY;  Service: Endoscopy;  Laterality: N/A;  . FOOT SURGERY  2011   bunionectomy and knot removed from bottom of both feet  . JOINT REPLACEMENT Left 2014   hip  Kraskinski  . LUMBAR LAMINECTOMY/DECOMPRESSION MICRODISCECTOMY   09/10/2011   Procedure: LUMBAR LAMINECTOMY/DECOMPRESSION MICRODISCECTOMY 1 LEVEL;  Surgeon: Hosie Spangle, MD;  Location: [place] NEURO ORS;  Service: Neurosurgery;  Laterality: Right;  RIGHT Lumbar  Laminotomy and microdiskectomy Lumbar Four-Five  . LUMBAR LAMINECTOMY/DECOMPRESSION MICRODISCECTOMY N/A 06/26/2015   Procedure: LUMBAR LAMINECTOMY/DECOMPRESSION MICRODISCECTOMY 1 LEVEL;  Surgeon: Jovita Gamma, MD;  Location: [place] NEURO ORS;  Service: Neurosurgery;  Laterality: N/A;  L3 and L4 Laminectomies  . MELANOMA EXCISION  2011   on nose/back on neck  . THORACIC DISC SURGERY  at age 57 and 63    mass of veins that had gotten into muscle and wrapped around rib;3 ribs also removed from left side  . TONSILLECTOMY  as a child   and adenoids   . TOTAL HIP ARTHROPLASTY Left 2014   Dr. Mack Guise  . TUBAL LIGATION    . VAGINA SURGERY     vaginal wall ruptured    Family History  Problem Relation Age of Onset  . Heart disease Brother        valve replacement  . COPD Brother   . COPD Mother        Emphysema  . Diabetes Mother   . Other Mother        Died in house fire at age 12.  Marland Kitchen Heart disease Father   . Seizures Father   . Cancer Father        Lung cancer with brain metastasis  . Asthma Brother   . COPD Son 79  . Cancer Other        aunt ? maternal vs paternal side lung cancer   . Diabetes Maternal Grandmother   . Anesthesia problems Neg Hx   . Hypotension Neg Hx   . Malignant hyperthermia Neg Hx   . Pseudochol deficiency Neg Hx   . Thyroid disease Neg Hx   . Breast cancer Neg Hx    Social History   Socioeconomic History  . Marital status: Widowed    Spouse name: Not on file  . Number of children: 2  . Years of education: 65  . Highest education level: High school graduate  Occupational History  . Occupation: Retired  Scientific laboratory technician  . Financial resource strain: Not hard at all  . Food insecurity:    Worry: Never true    Inability: Never true  . Transportation needs:     Medical: No    Non-medical: No  Tobacco Use  . Smoking status: Former Smoker    Packs/day: 0.25    Years: 20.00    Pack years: 5.00    Types: Cigarettes    Last attempt to quit: 2018    Years since quitting: 2.1  . Smokeless tobacco: Never Used  . Tobacco comment: Pt quit 11/08/2014.Pt had quit smoking for 13  years before starting back in 2010; per 05/2017 visit quit smoking 2017 smoked total 25 years 1 ppd   Substance and Sexual Activity  . Alcohol use: No    Alcohol/week: 0.0 standard drinks  . Drug use: No  . Sexual activity: Never    Birth control/protection: Surgical  Lifestyle  . Physical activity:    Days per week: Not on file    Minutes per session: Not on file  . Stress: Not at all  Relationships  . Social connections:    Talks on phone: Not on file    Gets together: Not on file    Attends religious service: Not on file    Active member of club or organization: Not on file    Attends meetings of clubs or organizations: Not on file    Relationship status: Not on file  Other Topics Concern  . Not on file  Social History Narrative   Widowed.    Lives alone.   Right-handed.    2 cups caffeine per day.    Outpatient Encounter Medications as of 09/02/2018  Medication Sig  . albuterol (PROVENTIL HFA;VENTOLIN HFA) 108 (90 Base) MCG/ACT inhaler Inhale 2 puffs into the lungs every 6 (six) hours as needed for wheezing.  Marland Kitchen ALPRAZolam (XANAX) 0.25 MG tablet TAKE 1 TABLET BY MOUTH TWICE A DAY AS NEEDED  . aspirin EC 81 MG tablet Take 81 mg by mouth daily.  Marland Kitchen atenolol (TENORMIN) 50 MG tablet TAKE 1 TABLET (50 MG TOTAL) BY MOUTH 2 (TWO) TIMES DAILY.  Marland Kitchen atorvastatin (LIPITOR) 40 MG tablet TAKE 1 TABLET BY MOUTH DAILY AT 6 PM.  . glucose blood (ONETOUCH VERIO) test strip Use as instructed  . hydrochlorothiazide (HYDRODIURIL) 12.5 MG tablet TAKE 1 TABLET (12.5 MG TOTAL) BY MOUTH DAILY.  Marland Kitchen Insulin Detemir (LEVEMIR FLEXPEN) 100 UNIT/ML Pen Inject 20 Units into the skin daily at 10  pm.  . Insulin Pen Needle (BD PEN NEEDLE NANO U/F) 32G X 4 MM MISC USE DAILY AS DIRECTED with levemir  . Lancets (ONETOUCH ULTRASOFT) lancets Use as instructed  . lisinopril (PRINIVIL,ZESTRIL) 10 MG tablet TAKE 1 TABLET BY MOUTH DAILY  . metFORMIN (GLUCOPHAGE) 1000 MG tablet TAKE 1 TABLET BY MOUTH 2 TIMES DAILY WITH A MEAL.  . methimazole (TAPAZOLE) 10 MG tablet Take 1 tablet (10 mg total) by mouth 2 (two) times daily.  Marland Kitchen omeprazole (PRILOSEC) 20 MG capsule Take 1 capsule (20 mg total) by mouth daily. Take 30 min. Before meal.  . traMADol (ULTRAM) 50 MG tablet TAKE 1 TABLET BY MOUTH EVERY 6 HOURS AS NEEDED FOR SPINAL STENOSIS PAIN   No facility-administered encounter medications on file as of 09/02/2018.     Activities of Daily Living In your present state of health, do you have any difficulty performing the following activities: 09/02/2018  Hearing? N  Vision? N  Difficulty concentrating or making decisions? N  Walking or climbing stairs? Y  Comment Chronic back pain  Dressing or bathing? N  Doing errands, shopping? N  Preparing Food and eating ? N  Using the Toilet? N  In the past six months, have you accidently leaked urine? N  Do you have problems with loss of bowel control? N  Managing your Medications? N  Managing your Finances? N  Housekeeping or managing your Housekeeping? N  Some recent data might be hidden    Patient Care Team: Crecencio Mc, MD as PCP - General (Internal Medicine) Jovita Gamma, MD as Consulting Physician (Neurosurgery)  Assessment:   This is a routine wellness examination for Maria Hinton.  Diabetes - followed by pcp. Reports her last 3 FBS as 117, 75, 125.  TSH recheck on 09/07/18.   Routine follow up 10/11/18.  Keep all routine maintenance appointments with your doctor.   Health Screenings  Mammogram -06/14/18 Colonoscopy -05/13/15 Bone Density -02/22/14 Glaucoma -none Hearing -demonstrated normal hearing during conversation Hemoglobin A1C  -04/11/18 (6.4) TSH- 07/20/18 (<0.01) T3 free- 07/20/18 (3.3) T4 free- 07/20/18 (1.03) Dental- every 6 months Vision-every 12 months  Social  Alcohol intake -no Smoking history- former Smokers in home? none Illicit drug use? none Exercise -walking every once in awhile Diet -low glycemic index diet Sexually Active -never  Safety  Patient feels safe at home.  Patient does have smoke detectors at home  Patient does wear sunscreen or protective clothing when in direct sunlight. Patient does wear seat belt when driving or riding with others.   Activities of Daily Living Patient can do their own household chores. Denies needing assistance with: driving, feeding themselves, getting from bed to chair, getting to the toilet, bathing/showering, dressing, managing money, or preparing meals.   Depression Screen Patient denies losing interest in daily life, feeling hopeless, or crying easily over simple problems.   Fall Screen Patient denies being afraid of falling. 2 falls in the last year without injury.  Reports them as leg weakness and not picking up her appropriately.   Memory Screen Patient denies problems with memory, misplacing items, and is able to balance checkbook/bank accounts.  Patient is alert, normal appearance, oriented to person/place/and time. Correctly identified the president of the [place], recall of 2/3 objects, and performing simple calculations.  Patient displays appropriate judgement and can read correct time from watch face.   Immunizations The following Immunizations are up to date: Influenza, pneumonia, and tetanus. Shingles discussed.   Other Providers Patient Care Team: Crecencio Mc, MD as PCP - General (Internal Medicine) Jovita Gamma, MD as Consulting Physician (Neurosurgery)   Exercise Activities and Dietary recommendations Current Exercise Habits: The patient does not participate in regular exercise at present  Goals      Patient Stated   . DIET  - INCREASE WATER INTAKE (pt-stated)     Stay hydrated    . Increase physical activity (pt-stated)     Walk more for exercise    . Weight (lb) < 150 lb (68 kg) (pt-stated)       Fall Risk Fall Risk  09/02/2018 04/05/2017 04/05/2017 01/30/2016 09/17/2014  Falls in the past year? 1 Yes No Yes No  Number falls in past yr: 1 2 or more - 1 -  Comment States she did not lift her feet appropriately when walking=fall 1.  States her legs were weak at home =fall2. No injury.  - - - -  Injury with Fall? 0 - - Yes -  Comment - - - Rolled off the bed.  L hip hairline Fx.  Stable and followed by ortho and PCP. -  Follow up - - - Education provided;Falls prevention discussed -   Depression Screen PHQ 2/9 Scores 09/02/2018 04/11/2018 04/05/2017 01/30/2016  PHQ - 2 Score 0 0 3 0  PHQ- 9 Score - 4 14 -     Cognitive Function MMSE - Mini Mental State Exam 01/30/2016  Orientation to time 5  Orientation to Place 5  Registration 3  Attention/ Calculation 5  Recall 3  Language- name 2 objects 2  Language- repeat 1  Language- follow 3  step command 3  Language- read & follow direction 1  Write a sentence 1  Copy design 1  Total score 30     6CIT Screen 09/02/2018  What Year? 0 points  What month? 0 points  What time? 0 points  Count back from 20 0 points  Months in reverse 0 points  Repeat phrase 0 points  Total Score 0    Immunization History  Administered Date(s) Administered  . Influenza Split 09/11/2011  . Influenza, High Dose Seasonal PF 06/03/2016, 04/05/2017, 04/11/2018  . Influenza,inj,Quad PF,6+ Mos 04/27/2013, 09/17/2014, 04/12/2015  . Pneumococcal Conjugate-13 02/09/2014  . Pneumococcal Polysaccharide-23 09/11/2011, 02/25/2018  . Tdap 02/09/2014   Screening Tests Health Maintenance  Topic Date Due  . FOOT EXAM  09/07/2017  . HEMOGLOBIN A1C  10/11/2018  . OPHTHALMOLOGY EXAM  10/20/2018  . MAMMOGRAM  06/14/2020  . TETANUS/TDAP  02/10/2024  . COLONOSCOPY  05/12/2025  .  INFLUENZA VACCINE  Completed  . DEXA SCAN  Completed  . Hepatitis C Screening  Completed  . PNA vac Low Risk Adult  Completed      Plan:    End of life planning; Advance aging; Advanced directives discussed. Copy of current HCPOA/Living Will requested.    I have personally reviewed and noted the following in the patient's chart:   . Medical and social history . Use of alcohol, tobacco or illicit drugs  . Current medications and supplements . Functional ability and status . Nutritional status . Physical activity . Advanced directives . List of other physicians . Hospitalizations, surgeries, and ER visits in previous 12 months . Vitals . Screenings to include cognitive, depression, and falls . Referrals and appointments  In addition, I have reviewed and discussed with patient certain preventive protocols, quality metrics, and best practice recommendations. A written personalized care plan for preventive services as well as general preventive health recommendations were provided to patient.     OBrien-Blaney, Denisa L, LPN  [id number]/70/[id number]   I have reviewed the above information and agree with above.   Deborra Medina, MD

## 2018-09-02 NOTE — Patient Instructions (Addendum)
  Ms. Pica , Thank you for taking time to come for your Medicare Wellness Visit. I appreciate your ongoing commitment to your health goals. Please review the following plan we discussed and let me know if I can assist you in the future.   Follow up as needed.    Keep all routine maintenance appointments with your doctor.   Bring a copy of your [place] and/or Living Will to be scanned into chart.  Have a great day!  These are the goals we discussed: Goals      Patient Stated   . DIET - INCREASE WATER INTAKE (pt-stated)     Stay hydrated    . Increase physical activity (pt-stated)     Walk more for exercise    . Weight (lb) < 150 lb (68 kg) (pt-stated)       This is a list of the screening recommended for you and due dates:  Health Maintenance  Topic Date Due  . Complete foot exam   09/07/2017  . Hemoglobin A1C  10/11/2018  . Eye exam for diabetics  10/20/2018  . Mammogram  06/14/2020  . Tetanus Vaccine  02/10/2024  . Colon Cancer Screening  05/12/2025  . Flu Shot  Completed  . DEXA scan (bone density measurement)  Completed  .  Hepatitis C: One time screening is recommended by Center for Disease Control  (CDC) for  adults born from [birth year] through 1963.   Completed  . Pneumonia vaccines  Completed

## 2018-09-07 ENCOUNTER — Other Ambulatory Visit (INDEPENDENT_AMBULATORY_CARE_PROVIDER_SITE_OTHER): Payer: Medicare HMO

## 2018-09-07 DIAGNOSIS — E0590 Thyrotoxicosis, unspecified without thyrotoxic crisis or storm: Secondary | ICD-10-CM

## 2018-09-07 LAB — TSH: TSH: 2.54 u[IU]/mL (ref 0.35–4.50)

## 2018-09-07 LAB — T4, FREE: Free T4: 0.65 ng/dL (ref 0.60–1.60)

## 2018-09-08 LAB — THYROTROPIN RECEPTOR AUTOABS: Thyrotropin Receptor Ab: 2.53 IU/L — ABNORMAL HIGH (ref 0.00–1.75)

## 2018-09-12 ENCOUNTER — Other Ambulatory Visit: Payer: Self-pay | Admitting: Internal Medicine

## 2018-09-14 ENCOUNTER — Ambulatory Visit: Payer: Medicare HMO | Admitting: Endocrinology

## 2018-09-14 ENCOUNTER — Encounter: Payer: Self-pay | Admitting: Endocrinology

## 2018-09-14 ENCOUNTER — Other Ambulatory Visit: Payer: Self-pay

## 2018-09-14 VITALS — BP 134/70 | HR 77 | Ht 64.5 in | Wt 154.0 lb

## 2018-09-14 DIAGNOSIS — E0590 Thyrotoxicosis, unspecified without thyrotoxic crisis or storm: Secondary | ICD-10-CM | POA: Diagnosis not present

## 2018-09-14 NOTE — Progress Notes (Signed)
Patient ID: Maria Hinton, female   DOB: Jul 28, 1945, 73 y.o.   MRN: [id number]                                                                                                              Reason for Appointment:  Hyperthyroidism, follow-up visit   Referring physician: Derrel Nip   History of Present Illness:   Prior history:  For several months before her consultation she had symptoms of weakness, palpitations, shakiness, feeling sweaty in am, nervousness, and fatigue.  Also would feel a strong heartbeat in her ears She had been on atenolol for hypertension also She had a decreased appetite and started losing weight late last summer  The patient had lost about  26 lbs since these symptoms started   RECENT history:  She is taking methimazole since 3/19 for her hyperthyroidism On her 10/2017 visit she was taking 10 mg twice daily because of hyperthyroidism she was given 30 mg daily  In follow-up she has been requiring periodically reduced doses of her methimazole  However since late 2019 she appears to be needing progressively higher doses of methimazole  On her last visit in 12/19 she was having some fatigue and weakness although did not complain of feeling shaky or having palpitations Also had no weight loss However her thyroid levels were significantly high with 7.5 mg of methimazole  She is now taking 20 mg of methimazole in divided doses  She continues to take atenolol 50 mg which she has been taking for several years for hypertension  She is feeling less tired, no heat or cold intolerance Her weight is about the same or possibly up slightly  Wt Readings from Last 3 Encounters:  09/14/18 154 lb (69.9 kg)  09/02/18 152 lb 1.9 oz (69 kg)  07/27/18 153 lb 12.8 oz (69.8 kg)     Thyroid levels are back to normal   Last thyrotropin receptor antibody is only mildly increased and improved from before  Thyroid function tests as follows:     Lab Results  Component Value Date   FREET4 0.65 09/07/2018   FREET4 1.03 07/20/2018   FREET4 3.17 (H) 06/21/2018   T3FREE 3.3 07/20/2018   T3FREE 8.4 (H) 06/21/2018   T3FREE 3.7 05/05/2018   TSH 2.54 09/07/2018   TSH <0.01 (L) 07/20/2018   TSH <0.006 (L) 06/21/2018    Lab Results  Component Value Date   THYROTRECAB 2.53 (H) 09/07/2018   THYROTRECAB 2.05 (H) 06/21/2018   THYROTRECAB 2.79 (H) 02/28/2018     Allergies as of 09/14/2018      Reactions   Vicodin [hydrocodone-acetaminophen] Other (See Comments)   Insomnia       Medication List       Accurate as of September 14, 2018  1:19 PM. Always use your most recent med list.        albuterol 108 (90 Base) MCG/ACT inhaler Commonly known as:  PROVENTIL HFA;VENTOLIN HFA Inhale 2 puffs into the lungs every 6 (six) hours as needed for wheezing.  ALPRAZolam 0.25 MG tablet Commonly known as:  XANAX TAKE 1 TABLET BY MOUTH TWICE A DAY AS NEEDED   aspirin EC 81 MG tablet Take 81 mg by mouth daily.   atenolol 50 MG tablet Commonly known as:  TENORMIN TAKE 1 TABLET (50 MG TOTAL) BY MOUTH 2 (TWO) TIMES DAILY.   atorvastatin 40 MG tablet Commonly known as:  LIPITOR TAKE 1 TABLET BY MOUTH DAILY AT 6 PM.   glucose blood test strip Commonly known as:  OneTouch Verio Use as instructed   hydrochlorothiazide 12.5 MG tablet Commonly known as:  HYDRODIURIL TAKE 1 TABLET (12.5 MG TOTAL) BY MOUTH DAILY.   Insulin Detemir 100 UNIT/ML Pen Commonly known as:  Levemir FlexPen Inject 20 Units into the skin daily at 10 pm.   Insulin Pen Needle 32G X 4 MM Misc Commonly known as:  BD Pen Needle Nano U/F USE DAILY AS DIRECTED with levemir   lisinopril 10 MG tablet Commonly known as:  PRINIVIL,ZESTRIL TAKE 1 TABLET BY MOUTH DAILY   metFORMIN 1000 MG tablet Commonly known as:  GLUCOPHAGE TAKE 1 TABLET BY MOUTH 2 TIMES DAILY WITH A MEAL.   methimazole 10 MG tablet Commonly known as:  TAPAZOLE Take 1 tablet (10 mg total) by mouth 2 (two) times daily.   omeprazole 20  MG capsule Commonly known as:  PRILOSEC Take 1 capsule (20 mg total) by mouth daily. Take 30 min. Before meal.   onetouch ultrasoft lancets Use as instructed   traMADol 50 MG tablet Commonly known as:  ULTRAM TAKE 1 TABLET BY MOUTH EVERY 6 HOURS AS NEEDED FOR SPINAL STENOSIS PAIN           Past Medical History:  Diagnosis Date  . Actinic keratoses    follows with dermatology   . Arthritis   . Asthma   . Chronic back pain    DDD,herniated disc/spondylosis/radiculopathy  . COPD (chronic obstructive pulmonary disease) ([place])   . Diabetes mellitus    1980s  . Early cataracts, bilateral    Dr. Ruthine Dose - [facility]  . GERD (gastroesophageal reflux disease)    takes Omeprazole daily  . Hemorrhoids   . History of colonic polyps    colonoscopy 04/2012 - Dr Candace Cruise  . Hyperlipidemia    takes Pravastatin daily  . Hypertension    takes Metoprolol/HCTZ daily  . Insomnia   . Joint pain    fingers  . Joint swelling   . Pneumonia    x 2 ;in Dec 2010/2011  . PONV (postoperative nausea and vomiting)   . Right leg weakness     Past Surgical History:  Procedure Laterality Date  . ABDOMINAL HYSTERECTOMY     at age 67  . BACK SURGERY  20+yrs ago  . COLONOSCOPY    . COLONOSCOPY N/A 05/13/2015   Procedure: COLONOSCOPY;  Surgeon: Hulen Luster, MD;  Location: [facility] ENDOSCOPY;  Service: Gastroenterology;  Laterality: N/A;  . ESOPHAGOGASTRODUODENOSCOPY  2011  . ESOPHAGOGASTRODUODENOSCOPY (EGD) WITH PROPOFOL N/A 09/02/2017   Procedure: ESOPHAGOGASTRODUODENOSCOPY (EGD) WITH PROPOFOL;  Surgeon: Virgel Manifold, MD;  Location: [facility] ENDOSCOPY;  Service: Endoscopy;  Laterality: N/A;  . FOOT SURGERY  2011   bunionectomy and knot removed from bottom of both feet  . JOINT REPLACEMENT Left 2014   hip  Kraskinski  . LUMBAR LAMINECTOMY/DECOMPRESSION MICRODISCECTOMY  09/10/2011   Procedure: LUMBAR LAMINECTOMY/DECOMPRESSION MICRODISCECTOMY 1 LEVEL;  Surgeon: Hosie Spangle, MD;  Location:  [place] NEURO ORS;  Service: Neurosurgery;  Laterality: Right;  RIGHT Lumbar  Laminotomy and microdiskectomy Lumbar Four-Five  . LUMBAR LAMINECTOMY/DECOMPRESSION MICRODISCECTOMY N/A 06/26/2015   Procedure: LUMBAR LAMINECTOMY/DECOMPRESSION MICRODISCECTOMY 1 LEVEL;  Surgeon: Jovita Gamma, MD;  Location: [place] [facility]URO ORS;  Service: Neurosurgery;  Laterality: N/A;  L3 and L4 Laminectomies  . MELANOMA EXCISION  2011   on nose/back on neck  . THORACIC DISC SURGERY  at age 24 and 40    mass of veins that had gotten into muscle and wrapped around rib;3 ribs also removed from left side  . TONSILLECTOMY  as a child   and adenoids   . TOTAL HIP ARTHROPLASTY Left 2014   Dr. Mack Guise  . TUBAL LIGATION    . VAGINA SURGERY     vaginal wall ruptured     Family History  Problem Relation Age of Onset  . Heart disease Brother        valve replacement  . COPD Brother   . COPD Mother        Emphysema  . Diabetes Mother   . Other Mother        Died in house fire at age 58.  Marland Kitchen Heart disease Father   . Seizures Father   . Cancer Father        Lung cancer with brain metastasis  . Asthma Brother   . COPD Son 53  . Cancer Other        aunt ? maternal vs paternal side lung cancer   . Diabetes Maternal Grandmother   . Anesthesia problems Neg Hx   . Hypotension Neg Hx   . Malignant hyperthermia Neg Hx   . Pseudochol deficiency Neg Hx   . Thyroid disease Neg Hx   . Breast cancer Neg Hx     Social History:  reports that she quit smoking about 2 years ago. Her smoking use included cigarettes. She has a 5.00 pack-year smoking history. She has never used smokeless tobacco. She reports that she does not drink alcohol or use drugs.  Allergies:  Allergies  Allergen Reactions  . Vicodin [Hydrocodone-Acetaminophen] Other (See Comments)    Insomnia      Review of Systems  She is on insulin for diabetes and followed by PCP  Lab Results  Component Value Date   HGBA1C 6.4 (A) 04/11/2018   HGBA1C 6.4 (A)  02/25/2018   HGBA1C 7.0 (H) 10/05/2017   Lab Results  Component Value Date   MICROALBUR 1.6 07/07/2017   LDLCALC 53 10/05/2017   CREATININE 0.95 05/05/2018       Examination:   BP 134/70 (BP Location: Left Arm, Patient Position: Sitting, Cuff Size: Normal)   Pulse 77   Ht 5' 4.5" (1.638 m)   Wt 154 lb (69.9 kg)   SpO2 97%   BMI 26.03 kg/m   Thyroid not palpable Biceps reflex shows normal relaxation on the right  Skin temperature appears normal   Assessment/Plan:   Hyperthyroidism, diagnosed to be from Graves' disease, on antithyroid drug treatment since 09/2017  She has had variable dosage requirements for methimazole Currently taking 20 mg a day of the methimazole  With this she is doing fairly well without any subjective symptoms of hyperthyroidism or hypothyroidism Appears that now her thyroid level is trending lower with low normal free T4 and TSH is not suppressed  However thyrotropin receptor antibody still not improved Still unclear whether she will be able to get into remission with methimazole alone However since she appears to be requiring lower doses will continue methimazole but  reduce the dose to a total of 15 mg a day She will take 1 tablet in the morning and half in the evening Follow-up in 6 to 7 weeks    09/14/2018, 1:19 PM    Note: This office note was prepared with Dragon voice recognition system technology. Any transcriptional errors that result from this process are unintentional.

## 2018-09-14 NOTE — Patient Instructions (Signed)
Total 1 1/2 methimazole

## 2018-09-24 ENCOUNTER — Other Ambulatory Visit: Payer: Self-pay | Admitting: Endocrinology

## 2018-09-26 ENCOUNTER — Other Ambulatory Visit: Payer: Self-pay | Admitting: Internal Medicine

## 2018-09-26 MED ORDER — LOSARTAN POTASSIUM 50 MG PO TABS: 50.0000 mg | ORAL_TABLET | Freq: Every day | ORAL | 3 refills | Status: DC

## 2018-09-27 ENCOUNTER — Other Ambulatory Visit: Payer: Self-pay | Admitting: Internal Medicine

## 2018-10-04 ENCOUNTER — Other Ambulatory Visit: Payer: Self-pay | Admitting: Internal Medicine

## 2018-10-05 NOTE — Telephone Encounter (Signed)
Refilled: 03/02/2018 Last OV: 04/11/2018 Next OV: 10/11/2018

## 2018-10-11 ENCOUNTER — Telehealth: Payer: Self-pay

## 2018-10-11 ENCOUNTER — Ambulatory Visit (INDEPENDENT_AMBULATORY_CARE_PROVIDER_SITE_OTHER): Payer: Medicare HMO | Admitting: Internal Medicine

## 2018-10-11 DIAGNOSIS — R748 Abnormal levels of other serum enzymes: Secondary | ICD-10-CM

## 2018-10-11 DIAGNOSIS — E7800 Pure hypercholesterolemia, unspecified: Secondary | ICD-10-CM

## 2018-10-11 DIAGNOSIS — Z794 Long term (current) use of insulin: Secondary | ICD-10-CM

## 2018-10-11 DIAGNOSIS — R1314 Dysphagia, pharyngoesophageal phase: Secondary | ICD-10-CM | POA: Diagnosis not present

## 2018-10-11 DIAGNOSIS — E1101 Type 2 diabetes mellitus with hyperosmolarity with coma: Secondary | ICD-10-CM | POA: Diagnosis not present

## 2018-10-11 DIAGNOSIS — I10 Essential (primary) hypertension: Secondary | ICD-10-CM

## 2018-10-11 MED ORDER — INSULIN DETEMIR 100 UNIT/ML FLEXPEN: 8.0000 [IU] | PEN_INJECTOR | Freq: Every day | SUBCUTANEOUS | 11 refills | Status: DC

## 2018-10-11 NOTE — Progress Notes (Signed)
Virtual Visit via Telephone Note  I connected with Maria Hinton on 10/12/18 at  1:00 PM EDT by telephone and verified that I am speaking with the correct person using two identifiers.   I discussed the limitations, risks, security and privacy concerns of performing an evaluation and management service by telephone and the availability of in person appointments. I also discussed with the patient that there may be a patient responsible charge related to this service. The patient expressed understanding and agreed to proceed.   History of Present Illness: 6 month follow up on diabetes, hypertension and hyperlipidemia.  Patient has no complaints today.  She does not feel that the COVID 19 epidemic has impacted her life significantly "because I'm a homebody."  Still walking her dog daily in the park, but using   the drive through for medications  . Has not been to grocery store n a week.   Patient is following a low glycemic index diet and taking all prescribed medications regularly  . She reports the occurrence of hypoglycemia occurring 2 or 3 times per week with several fasting CBS  below 80 .  She is taking 10 units of Levemir  at night. Glipizide was stopped 6 months ago  .  Patient has been referred to [facility] by her optometrist after her annual exam and has an appt in July  To discuss cataract surgery .  She  checks feet regularly for signs of infection.  Patient does not walk barefoot outside,  Because  She has  persistent numbness and tingling  in feet.  Toes are turning  under on the right,   Joint pain:  She reports persistent pain due to arthritis involving the joints of her right hand. It does prevent her from opening jars.   Patient is up to date on all recommended vaccinations  Hypertension: patient checks blood pressure twice weekly at home.  Readings have been for the most part < 140/80 at rest . Patient is following a reduce salt diet most days and is taking medications as  prescribed   HYPERthyroid: diagnosed in April 2019 during workup for unintentional weight loss.  Managed by endocrinology with methimazole.  Her weight has been stable and her TSH was normal in March 2020   Dysphagia  : she continues to have symptoms.  EGD with biopsies normal Feb 2019 . Still feels like she has a "knot" in her throat despite EGD and barium swallow .  Feels like the pill gets stuck in a pocket,  Feeling Lasts for 30 to 45 minutes,  And at times she has actually regurgitated a pill when she burps .  She has not followed up with GI since her EGD.    Observations/Objective:  General appearance: alert, cooperative and articulate.  No signs of being in distress  Lungs: not short of breath ,  No cough, speaking in full sentences  Psych: affect normal,dspeech is articulate and non pressured .  Denies suicidal thoughts  Assessment and Plan:  DM type 2 (diabetes mellitus, type 2) ([place]) With recurrent mild hypoglycemia  levemir dose reduced to 8units today for continued hypoglycemic events. She is due for labs   .  Lab Results  Component Value Date   HGBA1C 6.4 (A) 04/11/2018     HTN (hypertension), benign Well controlled based on home readings.  Continue  current regimen of atenolol,  hctz  And losartan.  Taking losartan at night.  Renal function is due   Dysphagia No  cause found with EGD and barium swallow,  But she continues to regurgitate pills on a regular basis.  Advised to follow up with GI   Elevated liver enzymes Resolvedwith management of hyperthyroid state .  Repeat assessment due    Lab Results  Component Value Date   ALT 17 05/05/2018   AST 15 05/05/2018   ALKPHOS 107 05/05/2018   BILITOT 0.6 05/05/2018      Updated Medication List Outpatient Encounter Medications as of 10/11/2018  Medication Sig  . albuterol (PROVENTIL HFA;VENTOLIN HFA) 108 (90 Base) MCG/ACT inhaler Inhale 2 puffs into the lungs every 6 (six) hours as needed for wheezing.  Marland Kitchen  ALPRAZolam (XANAX) 0.25 MG tablet TAKE 1 TABLET BY MOUTH TWICE A DAY AS NEEDED  . aspirin EC 81 MG tablet Take 81 mg by mouth daily.  Marland Kitchen atenolol (TENORMIN) 50 MG tablet TAKE 1 TABLET (50 MG TOTAL) BY MOUTH 2 (TWO) TIMES DAILY.  Marland Kitchen atorvastatin (LIPITOR) 40 MG tablet TAKE 1 TABLET BY MOUTH DAILY AT 6 PM.  . glucose blood (ONETOUCH VERIO) test strip Use as instructed  . hydrochlorothiazide (HYDRODIURIL) 12.5 MG tablet TAKE 1 TABLET (12.5 MG TOTAL) BY MOUTH DAILY.  Marland Kitchen Insulin Detemir (LEVEMIR FLEXPEN) 100 UNIT/ML Pen Inject 8 Units into the skin daily at 10 pm.  . Insulin Pen Needle (BD PEN NEEDLE NANO U/F) 32G X 4 MM MISC USE DAILY AS DIRECTED with levemir  . Lancets (ONETOUCH ULTRASOFT) lancets Use as instructed  . losartan (COZAAR) 50 MG tablet Take 1 tablet (50 mg total) by mouth daily.  . metFORMIN (GLUCOPHAGE) 1000 MG tablet TAKE 1 TABLET BY MOUTH 2 TIMES DAILY WITH A MEAL.  . methimazole (TAPAZOLE) 10 MG tablet TAKE 1 TABLET BY MOUTH TWICE A DAY  . omeprazole (PRILOSEC) 40 MG capsule TAKE 1 CAPSULE BY MOUTH EVERY DAY  . traMADol (ULTRAM) 50 MG tablet TAKE 1 TABLET BY MOUTH EVERY 6 HOURS AS NEEDED FOR SPINAL STENOSIS PAIN  . [DISCONTINUED] Insulin Detemir (LEVEMIR FLEXPEN) 100 UNIT/ML Pen Inject 20 Units into the skin daily at 10 pm.  . [DISCONTINUED] ALPRAZolam (XANAX) 0.25 MG tablet TAKE 1 TABLET BY MOUTH TWICE A DAY AS NEEDED  . [DISCONTINUED] atenolol (TENORMIN) 50 MG tablet TAKE 1 TABLET (50 MG TOTAL) BY MOUTH 2 (TWO) TIMES DAILY.  . [DISCONTINUED] atorvastatin (LIPITOR) 40 MG tablet TAKE 1 TABLET BY MOUTH DAILY AT 6 PM.  . [DISCONTINUED] hydrochlorothiazide (MICROZIDE) 12.5 MG capsule Take 12.5 mg by mouth daily.  . [DISCONTINUED] lisinopril (PRINIVIL,ZESTRIL) 10 MG tablet TAKE 1 TABLET BY MOUTH DAILY  . [DISCONTINUED] loperamide (IMODIUM A-D) 2 MG tablet Take 1 tablet (2 mg total) by mouth 4 (four) times daily as needed for diarrhea or loose stools.  . [DISCONTINUED] metFORMIN  (GLUCOPHAGE) 1000 MG tablet TAKE 1 TABLET BY MOUTH 2 TIMES DAILY WITH A MEAL.  . [DISCONTINUED] methimazole (TAPAZOLE) 5 MG tablet TAKE 1 TABLET BY MOUTH EVERY DAY  . [DISCONTINUED] omeprazole (PRILOSEC) 20 MG capsule Take 1 capsule (20 mg total) by mouth daily. Take 30 min. Before meal.  . [DISCONTINUED] ondansetron (ZOFRAN ODT) 4 MG disintegrating tablet Take 1 tablet (4 mg total) by mouth every 8 (eight) hours as needed for nausea or vomiting.  . [DISCONTINUED] traMADol (ULTRAM) 50 MG tablet TAKE 1 TABLET BY MOUTH EVERY 6 HOURS AS NEEDED FOR SPINAL STENOSIS PAIN   No facility-administered encounter medications on file as of 10/11/2018.     Follow Up Instructions:    I discussed the assessment  and treatment plan with the patient. The patient was provided an opportunity to ask questions and all were answered. The patient agreed with the plan and demonstrated an understanding of the instructions.   The patient was advised to call back or seek an in-person evaluation if the symptoms worsen or if the condition fails to improve as anticipated.  I provided 25 minutes of non-face-to-face time during this encounter.   Crecencio Mc, MD

## 2018-10-11 NOTE — Patient Instructions (Addendum)
Reduce your insulin dose to 8 units to prevent recurrent low blood sugars.  You should not have a blood sugar under 80    Check your blood pressure once  A week,  At variable times.  etme know if your readings are > 130/80.   You can use 2 advil and one tylenol every 12 hours for your rthritis pain  Your toes "curling under" happen when the tendons in your feet become too short  .  This can be treated by podiatry (foot doctors)  When you are ready.  When you come in for your thyroid labs,  We will check your cholesterol and diabetes  So do not eat lunch that day

## 2018-10-11 NOTE — Telephone Encounter (Signed)
Copied from [place] [id number]. Topic: General - Other >> Oct 11, 2018  1:29 PM Carolyn Stare wrote:  Pt was told to schedule a fup visit for July please contact

## 2018-10-12 NOTE — Assessment & Plan Note (Addendum)
Well controlled based on home readings.  Continue  current regimen of atenolol,  hctz  And losartan.  Taking losartan at night.  Renal function is due

## 2018-10-12 NOTE — Assessment & Plan Note (Addendum)
Resolvedwith management of hyperthyroid state .  Repeat assessment due    Lab Results  Component Value Date   ALT 17 05/05/2018   AST 15 05/05/2018   ALKPHOS 107 05/05/2018   BILITOT 0.6 05/05/2018

## 2018-10-12 NOTE — Assessment & Plan Note (Signed)
No cause found with EGD and barium swallow,  But she continues to regurgitate pills on a regular basis.  Advised to follow up with GI

## 2018-10-12 NOTE — Assessment & Plan Note (Signed)
With recurrent mild hypoglycemia  levemir dose reduced to 8units today for continued hypoglycemic events. She is due for labs   .  Lab Results  Component Value Date   HGBA1C 6.4 (A) 04/11/2018

## 2018-10-26 ENCOUNTER — Other Ambulatory Visit: Payer: Self-pay

## 2018-10-26 ENCOUNTER — Other Ambulatory Visit (INDEPENDENT_AMBULATORY_CARE_PROVIDER_SITE_OTHER): Payer: Medicare HMO

## 2018-10-26 DIAGNOSIS — Z794 Long term (current) use of insulin: Secondary | ICD-10-CM

## 2018-10-26 DIAGNOSIS — E1101 Type 2 diabetes mellitus with hyperosmolarity with coma: Secondary | ICD-10-CM | POA: Diagnosis not present

## 2018-10-26 DIAGNOSIS — E0590 Thyrotoxicosis, unspecified without thyrotoxic crisis or storm: Secondary | ICD-10-CM | POA: Diagnosis not present

## 2018-10-26 DIAGNOSIS — E7800 Pure hypercholesterolemia, unspecified: Secondary | ICD-10-CM | POA: Diagnosis not present

## 2018-10-26 LAB — MICROALBUMIN / CREATININE URINE RATIO
Creatinine,U: 99.6 mg/dL
Microalb Creat Ratio: 0.7 mg/g (ref 0.0–30.0)
Microalb, Ur: <0.7 mg/dL (ref 0.0–1.9)

## 2018-10-26 LAB — COMPREHENSIVE METABOLIC PANEL
ALT: 10 U/L (ref 0–35)
AST: 12 U/L (ref 0–37)
Albumin: 4.2 g/dL (ref 3.5–5.2)
Alkaline Phosphatase: 143 U/L — ABNORMAL HIGH (ref 39–117)
BUN: 12 mg/dL (ref 6–23)
CO2: 29 mEq/L (ref 19–32)
Calcium: 8.3 mg/dL — ABNORMAL LOW (ref 8.4–10.5)
Chloride: 94 mEq/L — ABNORMAL LOW (ref 96–112)
Creatinine, Ser: 0.8 mg/dL (ref 0.40–1.20)
GFR: 70.77 mL/min (ref >60.00)
Glucose, Bld: 101 mg/dL — ABNORMAL HIGH (ref 70–99)
Potassium: 4 mEq/L (ref 3.5–5.1)
Sodium: 134 mEq/L — ABNORMAL LOW (ref 135–145)
Total Bilirubin: 0.7 mg/dL (ref 0.2–1.2)
Total Protein: 6.6 g/dL (ref 6.0–8.3)

## 2018-10-26 LAB — LIPID PANEL
Cholesterol: 141 mg/dL (ref 0–200)
HDL: 45.9 mg/dL (ref >39.00)
LDL Cholesterol: 65 mg/dL (ref 0–99)
NonHDL: 94.7
Total CHOL/HDL Ratio: 3
Triglycerides: 148 mg/dL (ref 0.0–149.0)
VLDL: 29.6 mg/dL (ref 0.0–40.0)

## 2018-10-26 LAB — HEMOGLOBIN A1C: Hgb A1c MFr Bld: 6.9 % — ABNORMAL HIGH (ref 4.6–6.5)

## 2018-10-26 LAB — T3, FREE: T3, Free: 3 pg/mL (ref 2.3–4.2)

## 2018-10-26 LAB — T4, FREE: Free T4: 0.61 ng/dL (ref 0.60–1.60)

## 2018-10-26 LAB — TSH: TSH: 14.52 u[IU]/mL — ABNORMAL HIGH (ref 0.35–4.50)

## 2018-10-27 ENCOUNTER — Encounter: Payer: Self-pay | Admitting: Internal Medicine

## 2018-10-27 DIAGNOSIS — E0590 Thyrotoxicosis, unspecified without thyrotoxic crisis or storm: Secondary | ICD-10-CM | POA: Insufficient documentation

## 2018-10-27 DIAGNOSIS — E039 Hypothyroidism, unspecified: Secondary | ICD-10-CM | POA: Insufficient documentation

## 2018-11-02 ENCOUNTER — Encounter: Payer: Self-pay | Admitting: Endocrinology

## 2018-11-02 ENCOUNTER — Ambulatory Visit: Payer: Medicare HMO | Admitting: Endocrinology

## 2018-11-02 ENCOUNTER — Other Ambulatory Visit: Payer: Self-pay

## 2018-11-02 VITALS — BP 132/70 | HR 79 | Ht 64.5 in | Wt 155.2 lb

## 2018-11-02 DIAGNOSIS — E0590 Thyrotoxicosis, unspecified without thyrotoxic crisis or storm: Secondary | ICD-10-CM

## 2018-11-02 NOTE — Progress Notes (Signed)
Patient ID: Maria Hinton, female   DOB: 1946/04/15, 73 y.o.   MRN: [id number]                                                                                                              Reason for Appointment:  Hyperthyroidism, follow-up visit   Referring physician: Derrel Nip   History of Present Illness:   Prior history:  For several months before her consultation she had symptoms of weakness, palpitations, shakiness, feeling sweaty in am, nervousness, and fatigue.  Also would feel a strong heartbeat in her ears She had been on atenolol for hypertension also She had a decreased appetite and started losing weight late last summer  The patient had lost about  26 lbs since these symptoms started   RECENT history:  She is taking methimazole since 3/19 for her hyperthyroidism On her 10/2017 visit she was taking 10 mg twice daily because of hyperthyroidism she was given 30 mg daily  In follow-up she has been requiring periodically reduced doses of her methimazole  However since late 2019 she appears to be needing progressively higher doses of methimazole  On her visit in 12/19 she was having fatigue and weakness; did not complain of feeling shaky or having palpitations, no weight loss However her thyroid levels were significantly high with 7.5 mg of methimazole The dose was increased  She is now taking 15mg  of methimazole in divided doses since her last visit in 3/20  She continues to take atenolol 50 mg which she has been taking for several years for hypertension  She is more recently having problems with feeling sleepy and tired and having less motivation She does not have any heat or cold intolerance No significant change in weight No hair loss  Wt Readings from Last 3 Encounters:  11/02/18 155 lb 3.2 oz (70.4 kg)  09/14/18 154 lb (69.9 kg)  09/02/18 152 lb 1.9 oz (69 kg)     Thyroid levels are now showing significant hypothyroidism with TSH 14.5  Last thyrotropin receptor  antibody is mildly increased   Thyroid function tests as follows:     Lab Results  Component Value Date   FREET4 0.61 10/26/2018   FREET4 0.65 09/07/2018   FREET4 1.03 07/20/2018   T3FREE 3.0 10/26/2018   T3FREE 3.3 07/20/2018   T3FREE 8.4 (H) 06/21/2018   TSH 14.52 (H) 10/26/2018   TSH 2.54 09/07/2018   TSH <0.01 (L) 07/20/2018    Lab Results  Component Value Date   THYROTRECAB 2.53 (H) 09/07/2018   THYROTRECAB 2.05 (H) 06/21/2018   THYROTRECAB 2.79 (H) 02/28/2018     Allergies as of 11/02/2018      Reactions   Vicodin [hydrocodone-acetaminophen] Other (See Comments)   Insomnia       Medication List       Accurate as of November 02, 2018  1:35 PM. Always use your most recent med list.        albuterol 108 (90 Base) MCG/ACT inhaler Commonly known as:  VENTOLIN HFA Inhale 2  puffs into the lungs every 6 (six) hours as needed for wheezing.   ALPRAZolam 0.25 MG tablet Commonly known as:  XANAX TAKE 1 TABLET BY MOUTH TWICE A DAY AS NEEDED   aspirin EC 81 MG tablet Take 81 mg by mouth daily.   atenolol 50 MG tablet Commonly known as:  TENORMIN TAKE 1 TABLET (50 MG TOTAL) BY MOUTH 2 (TWO) TIMES DAILY.   atorvastatin 40 MG tablet Commonly known as:  LIPITOR TAKE 1 TABLET BY MOUTH DAILY AT 6 PM.   glucose blood test strip Commonly known as:  OneTouch Verio Use as instructed   hydrochlorothiazide 12.5 MG tablet Commonly known as:  HYDRODIURIL TAKE 1 TABLET (12.5 MG TOTAL) BY MOUTH DAILY.   Insulin Detemir 100 UNIT/ML Pen Commonly known as:  Levemir FlexPen Inject 8 Units into the skin daily at 10 pm.   Insulin Pen Needle 32G X 4 MM Misc Commonly known as:  BD Pen Needle Nano U/F USE DAILY AS DIRECTED with levemir   losartan 50 MG tablet Commonly known as:  COZAAR Take 1 tablet (50 mg total) by mouth daily.   metFORMIN 1000 MG tablet Commonly known as:  GLUCOPHAGE TAKE 1 TABLET BY MOUTH 2 TIMES DAILY WITH A MEAL.   methimazole 10 MG tablet Commonly  known as:  TAPAZOLE TAKE 1 TABLET BY MOUTH TWICE A DAY   omeprazole 40 MG capsule Commonly known as:  PRILOSEC TAKE 1 CAPSULE BY MOUTH EVERY DAY   onetouch ultrasoft lancets Use as instructed   traMADol 50 MG tablet Commonly known as:  ULTRAM TAKE 1 TABLET BY MOUTH EVERY 6 HOURS AS NEEDED FOR SPINAL STENOSIS PAIN           Past Medical History:  Diagnosis Date  . Actinic keratoses    follows with dermatology   . Arthritis   . Asthma   . Chronic back pain    DDD,herniated disc/spondylosis/radiculopathy  . COPD (chronic obstructive pulmonary disease) ([place])   . Diabetes mellitus    1980s  . Early cataracts, bilateral    Dr. Ruthine Dose - [facility]  . GERD (gastroesophageal reflux disease)    takes Omeprazole daily  . Hemorrhoids   . History of colonic polyps    colonoscopy 04/2012 - Dr Candace Cruise  . Hyperlipidemia    takes Pravastatin daily  . Hypertension    takes Metoprolol/HCTZ daily  . Insomnia   . Joint pain    fingers  . Joint swelling   . Pneumonia    x 2 ;in Dec 2010/2011  . PONV (postoperative nausea and vomiting)   . Right leg weakness     Past Surgical History:  Procedure Laterality Date  . ABDOMINAL HYSTERECTOMY     at age 87  . BACK SURGERY  20+yrs ago  . COLONOSCOPY    . COLONOSCOPY N/A 05/13/2015   Procedure: COLONOSCOPY;  Surgeon: Hulen Luster, MD;  Location: [facility] ENDOSCOPY;  Service: Gastroenterology;  Laterality: N/A;  . ESOPHAGOGASTRODUODENOSCOPY  2011  . ESOPHAGOGASTRODUODENOSCOPY (EGD) WITH PROPOFOL N/A 09/02/2017   Procedure: ESOPHAGOGASTRODUODENOSCOPY (EGD) WITH PROPOFOL;  Surgeon: Virgel Manifold, MD;  Location: [facility] ENDOSCOPY;  Service: Endoscopy;  Laterality: N/A;  . FOOT SURGERY  2011   bunionectomy and knot removed from bottom of both feet  . JOINT REPLACEMENT Left 2014   hip  Kraskinski  . LUMBAR LAMINECTOMY/DECOMPRESSION MICRODISCECTOMY  09/10/2011   Procedure: LUMBAR LAMINECTOMY/DECOMPRESSION MICRODISCECTOMY 1 LEVEL;   Surgeon: Hosie Spangle, MD;  Location: [facility] NEURO ORS;  Service: Neurosurgery;  Laterality: Right;  RIGHT Lumbar  Laminotomy and microdiskectomy Lumbar Four-Five  . LUMBAR LAMINECTOMY/DECOMPRESSION MICRODISCECTOMY N/A 06/26/2015   Procedure: LUMBAR LAMINECTOMY/DECOMPRESSION MICRODISCECTOMY 1 LEVEL;  Surgeon: Jovita Gamma, MD;  Location: [place] NEURO ORS;  Service: Neurosurgery;  Laterality: N/A;  L3 and L4 Laminectomies  . MELANOMA EXCISION  2011   on nose/back on neck  . THORACIC DISC SURGERY  at age 81 and 37    mass of veins that had gotten into muscle and wrapped around rib;3 ribs also removed from left side  . TONSILLECTOMY  as a child   and adenoids   . TOTAL HIP ARTHROPLASTY Left 2014   Dr. Mack Guise  . TUBAL LIGATION    . VAGINA SURGERY     vaginal wall ruptured     Family History  Problem Relation Age of Onset  . Heart disease Brother        valve replacement  . COPD Brother   . COPD Mother        Emphysema  . Diabetes Mother   . Other Mother        Died in house fire at age 52.  Marland Kitchen Heart disease Father   . Seizures Father   . Cancer Father        Lung cancer with brain metastasis  . Asthma Brother   . COPD Son 32  . Cancer Other        aunt ? maternal vs paternal side lung cancer   . Diabetes Maternal Grandmother   . Anesthesia problems Neg Hx   . Hypotension Neg Hx   . Malignant hyperthermia Neg Hx   . Pseudochol deficiency Neg Hx   . Thyroid disease Neg Hx   . Breast cancer Neg Hx     Social History:  reports that she quit smoking about 2 years ago. Her smoking use included cigarettes. She has a 5.00 pack-year smoking history. She has never used smokeless tobacco. She reports that she does not drink alcohol or use drugs.  Allergies:  Allergies  Allergen Reactions  . Vicodin [Hydrocodone-Acetaminophen] Other (See Comments)    Insomnia      Review of Systems  She is on insulin for diabetes and followed by PCP  CBG 80s in am and not checking after  meals Recent A1c higher  Lab Results  Component Value Date   HGBA1C 6.9 (H) 10/26/2018   HGBA1C 6.4 (A) 04/11/2018   HGBA1C 6.4 (A) 02/25/2018   Lab Results  Component Value Date   MICROALBUR <0.7 10/26/2018   LDLCALC 65 10/26/2018   CREATININE 0.80 10/26/2018       Examination:   BP 132/70 (BP Location: Left Arm, Patient Position: Sitting, Cuff Size: Normal)   Pulse 79   Ht 5' 4.5" (1.638 m)   Wt 155 lb 3.2 oz (70.4 kg)   SpO2 95%   BMI 26.23 kg/m      Assessment/Plan:   Hyperthyroidism, diagnosed to be from Graves' disease, on antithyroid drug treatment since 09/2017 No previous goiter  She has had variable dosage requirements for methimazole Currently taking 15 mg a day of the methimazole  Appears to be requiring progressively lower doses of levothyroxine now although I had become hypothyroid again in December 2019 This is despite her thyrotropin receptor antibody being high in March  With 15 mg of methimazole she is hypothyroid and is also symptomatic with TSH of 14.5  She may be able to taper off her methimazole with the recent  trend Currently she has been recommended taking 7.5 mg of methimazole daily using 10 mg 1 day and 5 the next  Follow-up in about a month  New One Touch Verio monitor was given to patient since her home meter is not working  [employer] 11/02/2018, 1:35 PM    Note: This office note was prepared with Estate agent. Any transcriptional errors that result from this process are unintentional.

## 2018-11-02 NOTE — Patient Instructions (Signed)
Thyroid Rx 1 pill alt with 1/2   Check blood sugars on waking up 2 days a week  Also check blood sugars about 2 hours after meals and do this after different meals by rotation  Recommended blood sugar levels on waking up are 90-130 and about 2 hours after meal is 130-160  Please bring your blood sugar monitor to each visit, thank you

## 2018-11-06 ENCOUNTER — Other Ambulatory Visit: Payer: Self-pay | Admitting: Internal Medicine

## 2018-11-07 NOTE — Telephone Encounter (Signed)
Refilled: 08/04/2018 Last OV: 10/11/2018 Next OV: 01/10/2019

## 2018-11-14 ENCOUNTER — Telehealth: Payer: Self-pay | Admitting: Internal Medicine

## 2018-11-14 ENCOUNTER — Other Ambulatory Visit: Payer: Self-pay | Admitting: Internal Medicine

## 2018-11-14 DIAGNOSIS — H2513 Age-related nuclear cataract, bilateral: Secondary | ICD-10-CM | POA: Diagnosis not present

## 2018-11-14 NOTE — Telephone Encounter (Signed)
Copied from [id number]. Topic: Quick Communication - See Telephone Encounter >> Nov 14, 2018  4:34 PM Blase Mess A wrote: CRM for notification. See Telephone encounter for: 11/14/18. Patient is calling to get labs done that where ordered by Dr. Dwyane Dee on 5/26 or 11/30/18 Please advise CB- [phone]

## 2018-11-15 NOTE — Telephone Encounter (Signed)
Scheduled

## 2018-11-23 LAB — HM DIABETES EYE EXAM

## 2018-11-30 ENCOUNTER — Other Ambulatory Visit (INDEPENDENT_AMBULATORY_CARE_PROVIDER_SITE_OTHER): Payer: Medicare HMO

## 2018-11-30 ENCOUNTER — Other Ambulatory Visit: Payer: Self-pay

## 2018-11-30 DIAGNOSIS — E0590 Thyrotoxicosis, unspecified without thyrotoxic crisis or storm: Secondary | ICD-10-CM

## 2018-11-30 LAB — T3, FREE: T3, Free: 3.1 pg/mL (ref 2.3–4.2)

## 2018-11-30 LAB — TSH: TSH: 6.85 u[IU]/mL — ABNORMAL HIGH (ref 0.35–4.50)

## 2018-11-30 LAB — T4, FREE: Free T4: 0.81 ng/dL (ref 0.60–1.60)

## 2018-12-07 ENCOUNTER — Other Ambulatory Visit: Payer: Self-pay

## 2018-12-07 ENCOUNTER — Ambulatory Visit: Payer: Medicare HMO | Admitting: Endocrinology

## 2018-12-07 ENCOUNTER — Encounter: Payer: Self-pay | Admitting: Endocrinology

## 2018-12-07 VITALS — BP 142/62 | HR 64 | Ht 64.5 in | Wt 153.4 lb

## 2018-12-07 DIAGNOSIS — E0590 Thyrotoxicosis, unspecified without thyrotoxic crisis or storm: Secondary | ICD-10-CM | POA: Diagnosis not present

## 2018-12-07 NOTE — Patient Instructions (Signed)
1/2 pill daily

## 2018-12-07 NOTE — Progress Notes (Signed)
Patient ID: Maria Hinton, female   DOB: June 11, 1946, 73 y.o.   MRN: [id number]                                                                                                              Reason for Appointment:  Hyperthyroidism, follow-up visit   Referring physician: Derrel Nip   History of Present Illness:   Prior history:  For several months before her consultation she had symptoms of weakness, palpitations, shakiness, feeling sweaty in am, nervousness, and fatigue.  Also would feel a strong heartbeat in her ears She had been on atenolol for hypertension also She had a decreased appetite and started losing weight late last summer  The patient had lost about  26 lbs since these symptoms started   RECENT history:  She is taking methimazole since 3/19 for her hyperthyroidism On her 10/2017 visit she was taking 10 mg twice daily because of hyperthyroidism she was given 30 mg daily  In follow-up she has been requiring periodically reduced doses of her methimazole  However since late 2019 she appears to be needing progressively higher doses of methimazole  On her visit in 12/19 she was having fatigue and weakness; did not complain of feeling shaky or having palpitations, no weight loss However her thyroid levels were significantly high with 7.5 mg of methimazole The dose was increased  RECENT history:  On her last visit in 4/20 she was having problems with feeling sleepy and tired and having less motivation She did not have any hair loss or weight change However her TSH with taking 15 mg methimazole was significantly higher at 14.5 and low normal free T4  Now with taking 10 mg alternating with 5 mg methimazole she is feeling generally better and not complaining of fatigue Her weight is down 2 pounds No cold or heat intolerance and no shakiness  She continues to take atenolol 50 mg which she has been taking for several years for hypertension  Wt Readings from Last 3 Encounters:    12/07/18 153 lb 6.4 oz (69.6 kg)  11/02/18 155 lb 3.2 oz (70.4 kg)  09/14/18 154 lb (69.9 kg)     Thyroid levels are now showing continued mild hypothyroidism with TSH  6.8 but improved free T4 and stable T3 Last thyrotropin receptor antibody is mildly increased   Thyroid function tests as follows:     Lab Results  Component Value Date   FREET4 0.81 11/30/2018   FREET4 0.61 10/26/2018   FREET4 0.65 09/07/2018   T3FREE 3.1 11/30/2018   T3FREE 3.0 10/26/2018   T3FREE 3.3 07/20/2018   TSH 6.85 (H) 11/30/2018   TSH 14.52 (H) 10/26/2018   TSH 2.54 09/07/2018    Lab Results  Component Value Date   THYROTRECAB 2.53 (H) 09/07/2018   THYROTRECAB 2.05 (H) 06/21/2018   THYROTRECAB 2.79 (H) 02/28/2018     Allergies as of 12/07/2018      Reactions   Vicodin [hydrocodone-acetaminophen] Other (See Comments)   Insomnia       Medication  List       Accurate as of December 07, 2018  1:35 PM. If you have any questions, ask your nurse or doctor.        albuterol 108 (90 Base) MCG/ACT inhaler Commonly known as:  VENTOLIN HFA Inhale 2 puffs into the lungs every 6 (six) hours as needed for wheezing.   ALPRAZolam 0.25 MG tablet Commonly known as:  XANAX TAKE 1 TABLET BY MOUTH TWICE A DAY AS NEEDED   aspirin EC 81 MG tablet Take 81 mg by mouth daily.   atenolol 50 MG tablet Commonly known as:  TENORMIN TAKE 1 TABLET (50 MG TOTAL) BY MOUTH 2 (TWO) TIMES DAILY.   atorvastatin 40 MG tablet Commonly known as:  LIPITOR TAKE 1 TABLET BY MOUTH DAILY AT 6 PM.   glucose blood test strip Commonly known as:  OneTouch Verio Use as instructed   hydrochlorothiazide 12.5 MG tablet Commonly known as:  HYDRODIURIL TAKE 1 TABLET (12.5 MG TOTAL) BY MOUTH DAILY.   Insulin Pen Needle 32G X 4 MM Misc Commonly known as:  BD Pen Needle Nano U/F USE DAILY AS DIRECTED with levemir   Levemir FlexTouch 100 UNIT/ML Pen Generic drug:  Insulin Detemir INJECT 15 UNITS INTO THE SKIN DAILY AT 10 PM.    losartan 50 MG tablet Commonly known as:  COZAAR Take 1 tablet (50 mg total) by mouth daily.   metFORMIN 1000 MG tablet Commonly known as:  GLUCOPHAGE TAKE 1 TABLET BY MOUTH 2 TIMES DAILY WITH A MEAL.   methimazole 10 MG tablet Commonly known as:  TAPAZOLE TAKE 1 TABLET BY MOUTH TWICE A DAY What changed:    when to take this  additional instructions   omeprazole 40 MG capsule Commonly known as:  PRILOSEC TAKE 1 CAPSULE BY MOUTH EVERY DAY   onetouch ultrasoft lancets Use as instructed   traMADol 50 MG tablet Commonly known as:  ULTRAM TAKE 1 TABLET BY MOUTH EVERY 6 HOURS AS NEEDED FOR SPINAL STENOSIS PAIN           Past Medical History:  Diagnosis Date   Actinic keratoses    follows with dermatology    Arthritis    Asthma    Chronic back pain    DDD,herniated disc/spondylosis/radiculopathy   COPD (chronic obstructive pulmonary disease) ([facility])    Diabetes mellitus    1980s   Early cataracts, bilateral    Dr. [name] [place]   GERD (gastroesophageal reflux disease)    takes Omeprazole daily   Hemorrhoids    History of colonic polyps    colonoscopy 04/2012 - Dr Candace Cruise   Hyperlipidemia    takes Pravastatin daily   Hypertension    takes Metoprolol/HCTZ daily   Insomnia    Joint pain    fingers   Joint swelling    Pneumonia    x 2 ;in Dec 2010/2011   PONV (postoperative nausea and vomiting)    Right leg weakness     Past Surgical History:  Procedure Laterality Date   ABDOMINAL HYSTERECTOMY     at age 49   BACK SURGERY  20+yrs ago   COLONOSCOPY     COLONOSCOPY N/A 05/13/2015   Procedure: COLONOSCOPY;  Surgeon: Hulen Luster, MD;  Location: [facility] ENDOSCOPY;  Service: Gastroenterology;  Laterality: N/A;   ESOPHAGOGASTRODUODENOSCOPY  2011   ESOPHAGOGASTRODUODENOSCOPY (EGD) WITH PROPOFOL N/A 09/02/2017   Procedure: ESOPHAGOGASTRODUODENOSCOPY (EGD) WITH PROPOFOL;  Surgeon: Virgel Manifold, MD;  Location: [facility] ENDOSCOPY;   Service:  Endoscopy;  Laterality: N/A;   FOOT SURGERY  2011   bunionectomy and knot removed from bottom of both feet   JOINT REPLACEMENT Left 2014   hip  Kraskinski   LUMBAR LAMINECTOMY/DECOMPRESSION MICRODISCECTOMY  09/10/2011   Procedure: LUMBAR LAMINECTOMY/DECOMPRESSION MICRODISCECTOMY 1 LEVEL;  Surgeon: Hosie Spangle, MD;  Location: [place] NEURO ORS;  Service: Neurosurgery;  Laterality: Right;  RIGHT Lumbar  Laminotomy and microdiskectomy Lumbar Four-Five   LUMBAR LAMINECTOMY/DECOMPRESSION MICRODISCECTOMY N/A 06/26/2015   Procedure: LUMBAR LAMINECTOMY/DECOMPRESSION MICRODISCECTOMY 1 LEVEL;  Surgeon: Jovita Gamma, MD;  Location: [place] NEURO ORS;  Service: Neurosurgery;  Laterality: N/A;  L3 and L4 Laminectomies   MELANOMA EXCISION  2011   on nose/back on neck   THORACIC DISC SURGERY  at age 35 and 44    mass of veins that had gotten into muscle and wrapped around rib;3 ribs also removed from left side   TONSILLECTOMY  as a child   and adenoids    TOTAL HIP ARTHROPLASTY Left 2014   Dr. Mack Guise   TUBAL LIGATION     VAGINA SURGERY     vaginal wall ruptured     Family History  Problem Relation Age of Onset   Heart disease Brother        valve replacement   COPD Brother    COPD Mother        Emphysema   Diabetes Mother    Other Mother        Died in house fire at age 68.   Heart disease Father    Seizures Father    Cancer Father        Lung cancer with brain metastasis   Asthma Brother    COPD Son 23   Cancer Other        aunt ? maternal vs paternal side lung cancer    Diabetes Maternal Grandmother    Anesthesia problems Neg Hx    Hypotension Neg Hx    Malignant hyperthermia Neg Hx    Pseudochol deficiency Neg Hx    Thyroid disease Neg Hx    Breast cancer Neg Hx     Social History:  reports that she quit smoking about 2 years ago. Her smoking use included cigarettes. She has a 5.00 pack-year smoking history. She has never used smokeless  tobacco. She reports that she does not drink alcohol or use drugs.  Allergies:  Allergies  Allergen Reactions   Vicodin [Hydrocodone-Acetaminophen] Other (See Comments)    Insomnia      Review of Systems  She is on insulin for diabetes and followed by PCP    Lab Results  Component Value Date   HGBA1C 6.9 (H) 10/26/2018   HGBA1C 6.4 (A) 04/11/2018   HGBA1C 6.4 (A) 02/25/2018   Lab Results  Component Value Date   MICROALBUR <0.7 10/26/2018   LDLCALC 65 10/26/2018   CREATININE 0.80 10/26/2018       Examination:   BP (!) 142/62 (BP Location: Left Arm, Patient Position: Sitting, Cuff Size: Normal)    Pulse 64    Ht 5' 4.5" (1.638 m)    Wt 153 lb 6.4 oz (69.6 kg)    SpO2 99%    BMI 25.92 kg/m   Thyroid not palpable Biceps reflexes appear normal No peripheral edema    Assessment/Plan:   Hyperthyroidism, diagnosed to be from Graves' disease, on antithyroid drug treatment since 09/2017 No previous goiter  She has had variable dosage requirements for methimazole Currently appears to be needing progressively  lower doses of methimazole  Even with cutting her dose is 50% from her previous dose of 15 mg a day of the methimazole her thyroid levels are still mildly low  She is subjectively doing well and not complaining as much of her fatigue compared to last visit Her weight is stable  It is likely that she may be able to get into remission now  She can now reduce her dosage down to 5 mg daily, she will let us know if she needs a new prescription for methimazole and can have a 5 mg tablet  Follow-up in 6 weeks      12/07/2018, 1:35 PM    Note: This office note was prepared with Dragon voice recognition system technology. Any transcriptional errors that result from this process are unintentional.

## 2018-12-09 ENCOUNTER — Other Ambulatory Visit: Payer: Self-pay | Admitting: Internal Medicine

## 2018-12-14 NOTE — Telephone Encounter (Signed)
Pt want to know status of refill request

## 2018-12-14 NOTE — Telephone Encounter (Signed)
Last OV 10/11/2018 Next OV 01/10/2019 Last refill 10/05/2018

## 2018-12-14 NOTE — Telephone Encounter (Signed)
Refill sent.  Remind pateint of refill policy

## 2018-12-15 NOTE — Telephone Encounter (Signed)
Noted  

## 2018-12-19 DIAGNOSIS — H2512 Age-related nuclear cataract, left eye: Secondary | ICD-10-CM | POA: Diagnosis not present

## 2018-12-19 DIAGNOSIS — J45909 Unspecified asthma, uncomplicated: Secondary | ICD-10-CM | POA: Diagnosis not present

## 2018-12-19 DIAGNOSIS — J449 Chronic obstructive pulmonary disease, unspecified: Secondary | ICD-10-CM | POA: Diagnosis not present

## 2018-12-28 ENCOUNTER — Encounter: Payer: Self-pay | Admitting: *Deleted

## 2018-12-28 ENCOUNTER — Other Ambulatory Visit: Payer: Self-pay

## 2018-12-30 ENCOUNTER — Other Ambulatory Visit
Admission: RE | Admit: 2018-12-30 | Discharge: 2018-12-30 | Disposition: A | Discharge status: Home / Self Care | Payer: Medicare HMO | Source: Ambulatory Visit | Attending: Ophthalmology | Admitting: Ophthalmology

## 2018-12-30 ENCOUNTER — Other Ambulatory Visit: Payer: Self-pay

## 2018-12-30 DIAGNOSIS — Z1159 Encounter for screening for other viral diseases: Secondary | ICD-10-CM | POA: Diagnosis not present

## 2018-12-30 NOTE — Discharge Instructions (Signed)

## 2018-12-31 LAB — NOVEL CORONAVIRUS, NAA (HOSP ORDER, SEND-OUT TO REF LAB; TAT 18-24 HRS): SARS-CoV-2, NAA: NOT DETECTED

## 2019-01-04 ENCOUNTER — Ambulatory Visit
Admission: RE | Admit: 2019-01-04 | Discharge: 2019-01-04 | Disposition: A | Discharge status: Home / Self Care | Payer: Medicare HMO | Attending: Ophthalmology | Admitting: Ophthalmology

## 2019-01-04 ENCOUNTER — Ambulatory Visit: Payer: Medicare HMO | Admitting: Anesthesiology

## 2019-01-04 ENCOUNTER — Encounter
Admission: RE | Disposition: A | Discharge status: Home / Self Care | Payer: Self-pay | Source: Home / Self Care | Attending: Ophthalmology

## 2019-01-04 DIAGNOSIS — E079 Disorder of thyroid, unspecified: Secondary | ICD-10-CM | POA: Diagnosis not present

## 2019-01-04 DIAGNOSIS — Z79891 Long term (current) use of opiate analgesic: Secondary | ICD-10-CM | POA: Diagnosis not present

## 2019-01-04 DIAGNOSIS — Z885 Allergy status to narcotic agent status: Secondary | ICD-10-CM | POA: Diagnosis not present

## 2019-01-04 DIAGNOSIS — Z79899 Other long term (current) drug therapy: Secondary | ICD-10-CM | POA: Diagnosis not present

## 2019-01-04 DIAGNOSIS — F419 Anxiety disorder, unspecified: Secondary | ICD-10-CM | POA: Insufficient documentation

## 2019-01-04 DIAGNOSIS — J449 Chronic obstructive pulmonary disease, unspecified: Secondary | ICD-10-CM | POA: Diagnosis not present

## 2019-01-04 DIAGNOSIS — H25812 Combined forms of age-related cataract, left eye: Secondary | ICD-10-CM | POA: Diagnosis not present

## 2019-01-04 DIAGNOSIS — H2512 Age-related nuclear cataract, left eye: Secondary | ICD-10-CM | POA: Diagnosis not present

## 2019-01-04 DIAGNOSIS — E7800 Pure hypercholesterolemia, unspecified: Secondary | ICD-10-CM | POA: Insufficient documentation

## 2019-01-04 DIAGNOSIS — E1136 Type 2 diabetes mellitus with diabetic cataract: Secondary | ICD-10-CM | POA: Diagnosis not present

## 2019-01-04 DIAGNOSIS — Z7982 Long term (current) use of aspirin: Secondary | ICD-10-CM | POA: Insufficient documentation

## 2019-01-04 DIAGNOSIS — Z87891 Personal history of nicotine dependence: Secondary | ICD-10-CM | POA: Insufficient documentation

## 2019-01-04 DIAGNOSIS — Z96642 Presence of left artificial hip joint: Secondary | ICD-10-CM | POA: Diagnosis not present

## 2019-01-04 DIAGNOSIS — Z794 Long term (current) use of insulin: Secondary | ICD-10-CM | POA: Diagnosis not present

## 2019-01-04 DIAGNOSIS — K219 Gastro-esophageal reflux disease without esophagitis: Secondary | ICD-10-CM | POA: Insufficient documentation

## 2019-01-04 DIAGNOSIS — R69 Illness, unspecified: Secondary | ICD-10-CM | POA: Diagnosis not present

## 2019-01-04 DIAGNOSIS — I10 Essential (primary) hypertension: Secondary | ICD-10-CM | POA: Insufficient documentation

## 2019-01-04 HISTORY — PX: CATARACT EXTRACTION W/PHACO: SHX586

## 2019-01-04 HISTORY — DX: Thyrotoxicosis, unspecified without thyrotoxic crisis or storm: E05.90

## 2019-01-04 LAB — GLUCOSE, CAPILLARY: Glucose-Capillary: 138 mg/dL — ABNORMAL HIGH (ref 70–99)

## 2019-01-04 SURGERY — PHACOEMULSIFICATION, CATARACT, WITH IOL INSERTION
Anesthesia: Monitor Anesthesia Care | Site: Eye | Laterality: Left

## 2019-01-04 MED ORDER — ONDANSETRON HCL 4 MG/2ML IJ SOLN
INTRAMUSCULAR | Status: DC | PRN
  Administered 2019-01-04: 4 mg via INTRAVENOUS

## 2019-01-04 MED ORDER — EPINEPHRINE PF 1 MG/ML IJ SOLN
INTRAOCULAR | Status: DC | PRN
  Administered 2019-01-04: 12:00:00 76 mL via OPHTHALMIC

## 2019-01-04 MED ORDER — ONDANSETRON HCL 4 MG/2ML IJ SOLN
4.0000 mg | Freq: Once | INTRAMUSCULAR | Status: AC
  Administered 2019-01-04: 4 mg via INTRAVENOUS

## 2019-01-04 MED ORDER — ONDANSETRON HCL 4 MG/2ML IJ SOLN: 4.0000 mg | Freq: Once | INTRAMUSCULAR | Status: DC | PRN

## 2019-01-04 MED ORDER — TETRACAINE HCL 0.5 % OP SOLN
1.0000 [drp] | OPHTHALMIC | Status: DC | PRN
  Administered 2019-01-04 (×3): 1 [drp] via OPHTHALMIC

## 2019-01-04 MED ORDER — LIDOCAINE HCL (PF) 2 % IJ SOLN
INTRAOCULAR | Status: DC | PRN
  Administered 2019-01-04: 1 mL

## 2019-01-04 MED ORDER — ARMC OPHTHALMIC DILATING DROPS
1.0000 "application " | OPHTHALMIC | Status: DC | PRN
  Administered 2019-01-04 (×3): 1 via OPHTHALMIC

## 2019-01-04 MED ORDER — MOXIFLOXACIN HCL 0.5 % OP SOLN
1.0000 [drp] | OPHTHALMIC | Status: DC | PRN
  Administered 2019-01-04 (×3): 1 [drp] via OPHTHALMIC

## 2019-01-04 MED ORDER — FENTANYL CITRATE (PF) 100 MCG/2ML IJ SOLN
INTRAMUSCULAR | Status: DC | PRN
  Administered 2019-01-04 (×2): 50 ug via INTRAVENOUS

## 2019-01-04 MED ORDER — BRIMONIDINE TARTRATE-TIMOLOL 0.2-0.5 % OP SOLN
OPHTHALMIC | Status: DC | PRN
  Administered 2019-01-04: 1 [drp] via OPHTHALMIC

## 2019-01-04 MED ORDER — NA HYALUR & NA CHOND-NA HYALUR 0.4-0.35 ML IO KIT
PACK | INTRAOCULAR | Status: DC | PRN
  Administered 2019-01-04: 1 mL via INTRAOCULAR

## 2019-01-04 MED ORDER — LACTATED RINGERS IV SOLN: INTRAVENOUS | Status: DC

## 2019-01-04 MED ORDER — CEFUROXIME OPHTHALMIC INJECTION 1 MG/0.1 ML
INJECTION | OPHTHALMIC | Status: DC | PRN
  Administered 2019-01-04: 0.1 mL via INTRACAMERAL

## 2019-01-04 MED ORDER — MIDAZOLAM HCL 2 MG/2ML IJ SOLN
INTRAMUSCULAR | Status: DC | PRN
  Administered 2019-01-04: 2 mg via INTRAVENOUS

## 2019-01-04 SURGICAL SUPPLY — 26 items
CANNULA ANT/CHMB 27G (MISCELLANEOUS) ×1 IMPLANT
CANNULA ANT/CHMB 27GA (MISCELLANEOUS) ×2 IMPLANT
GLOVE SURG LX 7.5 STRW (GLOVE) ×2
GLOVE SURG LX STRL 7.5 STRW (GLOVE) ×1 IMPLANT
GLOVE SURG TRIUMPH 8.0 PF LTX (GLOVE) ×2 IMPLANT
GOWN STRL REUS W/ TWL LRG LVL3 (GOWN DISPOSABLE) ×2 IMPLANT
GOWN STRL REUS W/TWL LRG LVL3 (GOWN DISPOSABLE) ×2
LENS IOL TECNIS ITEC 21.0 (Intraocular Lens) ×1 IMPLANT
MARKER SKIN DUAL TIP RULER LAB (MISCELLANEOUS) ×2 IMPLANT
NDL FILTER BLUNT 18X1 1/2 (NEEDLE) ×1 IMPLANT
NDL RETROBULBAR .5 NSTRL (NEEDLE) IMPLANT
NEEDLE FILTER BLUNT 18X 1/2SAF (NEEDLE) ×1
NEEDLE FILTER BLUNT 18X1 1/2 (NEEDLE) ×1 IMPLANT
PACK CATARACT BRASINGTON (MISCELLANEOUS) ×2 IMPLANT
PACK EYE AFTER SURG (MISCELLANEOUS) ×2 IMPLANT
PACK OPTHALMIC (MISCELLANEOUS) ×2 IMPLANT
RING MALYGIN 7.0 (MISCELLANEOUS) IMPLANT
SUT ETHILON 10-0 CS-B-6CS-B-6 (SUTURE)
SUT VICRYL  9 0 (SUTURE)
SUT VICRYL 9 0 (SUTURE) IMPLANT
SUTURE EHLN 10-0 CS-B-6CS-B-6 (SUTURE) IMPLANT
SYR 3ML LL SCALE MARK (SYRINGE) ×2 IMPLANT
SYR 5ML LL (SYRINGE) ×2 IMPLANT
SYR TB 1ML LUER SLIP (SYRINGE) ×2 IMPLANT
WATER STERILE IRR 500ML POUR (IV SOLUTION) ×2 IMPLANT
WIPE NON LINTING 3.25X3.25 (MISCELLANEOUS) ×2 IMPLANT

## 2019-01-04 NOTE — H&P (Signed)

## 2019-01-04 NOTE — Anesthesia Postprocedure Evaluation (Signed)
Anesthesia Post Note  Patient: Maria Hinton  Procedure(s) Performed: CATARACT EXTRACTION PHACO AND INTRAOCULAR LENS PLACEMENT (IOC)  LEFT (Left Eye)  Patient location during evaluation: PACU Anesthesia Type: MAC Level of consciousness: awake and alert Pain management: pain level controlled Vital Signs Assessment: post-procedure vital signs reviewed and stable Respiratory status: spontaneous breathing, nonlabored ventilation, respiratory function stable and patient connected to nasal cannula oxygen Cardiovascular status: stable and blood pressure returned to baseline Postop Assessment: no apparent nausea or vomiting Anesthetic complications: no    Veda Canning

## 2019-01-04 NOTE — Op Note (Signed)
OPERATIVE NOTE  Maria Hinton [id number] 01/04/2019   PREOPERATIVE DIAGNOSIS:  Nuclear sclerotic cataract left eye. H25.12   POSTOPERATIVE DIAGNOSIS:    Nuclear sclerotic cataract left eye.     PROCEDURE:  Phacoemusification with posterior chamber intraocular lens placement of the left eye   LENS:   Implant Name Type Inv. Item Serial No. Manufacturer Lot No. LRB No. Used Action  LENS IOL DIOP 21.0 - [id number] Intraocular Lens LENS IOL DIOP 21.0 [id number]  Left 1 Implanted        ULTRASOUND TIME: 10  % of 1 minutes 0 seconds, CDE 6.0  SURGEON:  Wyonia Hough, MD   ANESTHESIA:  Topical with tetracaine drops and 2% Xylocaine jelly, augmented with 1% preservative-free intracameral lidocaine.    COMPLICATIONS:  None.   DESCRIPTION OF PROCEDURE:  The patient was identified in the holding room and transported to the operating room and placed in the supine position under the operating microscope.  The left eye was identified as the operative eye and it was prepped and draped in the usual sterile ophthalmic fashion.   A 1 millimeter clear-corneal paracentesis was made at the 1:30 position.  0.5 ml of preservative-free 1% lidocaine was injected into the anterior chamber.  The anterior chamber was filled with Viscoat viscoelastic.  A 2.4 millimeter keratome was used to make a near-clear corneal incision at the 10:30 position.  .  A curvilinear capsulorrhexis was made with a cystotome and capsulorrhexis forceps.  Balanced salt solution was used to hydrodissect and hydrodelineate the nucleus.   Phacoemulsification was then used in stop and chop fashion to remove the lens nucleus and epinucleus.  The remaining cortex was then removed using the irrigation and aspiration handpiece. Provisc was then placed into the capsular bag to distend it for lens placement.  A lens was then injected into the capsular bag.  The remaining viscoelastic was aspirated.   Wounds were hydrated with  balanced salt solution.  The anterior chamber was inflated to a physiologic pressure with balanced salt solution.  No wound leaks were noted. Cefuroxime 0.1 ml of a 10mg /ml solution was injected into the anterior chamber for a dose of 1 mg of intracameral antibiotic at the completion of the case.   Timolol and Brimonidine drops were applied to the eye.  The patient was taken to the recovery room in stable condition without complications of anesthesia or surgery.  , 01/04/2019, 11:48 AM

## 2019-01-04 NOTE — Transfer of Care (Signed)
Immediate Anesthesia Transfer of Care Note  Patient: Maria Hinton  Procedure(s) Performed: CATARACT EXTRACTION PHACO AND INTRAOCULAR LENS PLACEMENT (IOC)  LEFT (Left Eye)  Patient Location: PACU  Anesthesia Type: MAC  Level of Consciousness: awake, alert  and patient cooperative  Airway and Oxygen Therapy: Patient Spontanous Breathing and Patient connected to supplemental oxygen  Post-op Assessment: Post-op Vital signs reviewed, Patient's Cardiovascular Status Stable, Respiratory Function Stable, Patent Airway and No signs of Nausea or vomiting  Post-op Vital Signs: Reviewed and stable  Complications: No apparent anesthesia complications

## 2019-01-04 NOTE — Anesthesia Procedure Notes (Signed)
Procedure Name: MAC Performed by: ,  L, CRNA Pre-anesthesia Checklist: Patient identified, Emergency Drugs available, Suction available, Patient being monitored and Timeout performed Patient Re-evaluated:Patient Re-evaluated prior to induction Oxygen Delivery Method: Nasal cannula       

## 2019-01-04 NOTE — Anesthesia Preprocedure Evaluation (Signed)
Anesthesia Evaluation  Patient identified by MRN, date of birth, ID band Patient awake    Reviewed: Allergy & Precautions, NPO status   Airway Mallampati: II  TM Distance: >3 FB     Dental   Pulmonary COPD, former smoker,    breath sounds clear to auscultation       Cardiovascular hypertension,  Rhythm:Regular Rate:Normal     Neuro/Psych  Headaches, Anxiety    GI/Hepatic hiatal hernia, GERD  ,  Endo/Other  diabetesHyperthyroidism   Renal/GU      Musculoskeletal  (+) Arthritis ,   Abdominal   Peds  Hematology   Anesthesia Other Findings   Reproductive/Obstetrics                             Anesthesia Physical Anesthesia Plan  ASA: III  Anesthesia Plan: MAC   Post-op Pain Management:    Induction: Intravenous  PONV Risk Score and Plan:   Airway Management Planned: Nasal Cannula  Additional Equipment:   Intra-op Plan:   Post-operative Plan:   Informed Consent: I have reviewed the patients History and Physical, chart, labs and discussed the procedure including the risks, benefits and alternatives for the proposed anesthesia with the patient or authorized representative who has indicated his/her understanding and acceptance.       Plan Discussed with: CRNA  Anesthesia Plan Comments:         Anesthesia Quick Evaluation

## 2019-01-05 ENCOUNTER — Encounter: Payer: Self-pay | Admitting: Ophthalmology

## 2019-01-10 ENCOUNTER — Encounter: Payer: Self-pay | Admitting: Internal Medicine

## 2019-01-10 ENCOUNTER — Ambulatory Visit (INDEPENDENT_AMBULATORY_CARE_PROVIDER_SITE_OTHER): Payer: Medicare HMO | Admitting: Internal Medicine

## 2019-01-10 ENCOUNTER — Other Ambulatory Visit: Payer: Self-pay

## 2019-01-10 ENCOUNTER — Other Ambulatory Visit (INDEPENDENT_AMBULATORY_CARE_PROVIDER_SITE_OTHER): Payer: Medicare HMO

## 2019-01-10 DIAGNOSIS — E0590 Thyrotoxicosis, unspecified without thyrotoxic crisis or storm: Secondary | ICD-10-CM | POA: Diagnosis not present

## 2019-01-10 DIAGNOSIS — Z794 Long term (current) use of insulin: Secondary | ICD-10-CM

## 2019-01-10 DIAGNOSIS — E11649 Type 2 diabetes mellitus with hypoglycemia without coma: Secondary | ICD-10-CM | POA: Diagnosis not present

## 2019-01-10 DIAGNOSIS — I10 Essential (primary) hypertension: Secondary | ICD-10-CM

## 2019-01-10 LAB — T4, FREE: Free T4: 0.92 ng/dL (ref 0.60–1.60)

## 2019-01-10 LAB — TSH: TSH: 7.25 u[IU]/mL — ABNORMAL HIGH (ref 0.35–4.50)

## 2019-01-10 NOTE — Progress Notes (Signed)
Telephone  Note  This visit type was conducted due to national recommendations for restrictions regarding the COVID-19 pandemic (e.g. social distancing).  This format is felt to be most appropriate for this patient at this time.  All issues noted in this document were discussed and addressed.  No physical exam was performed (except for noted visual exam findings with Video Visits).   I connected with@ on 01/10/19 at  2:00 PM EDT by telephone and verified that I am speaking with the correct person using two identifiers. Location patient: home Location provider: work or home office Persons participating in the virtual visit: patient, provider  I discussed the limitations, risks, security and privacy concerns of performing an evaluation and management service by telephone and the availability of in person appointments. I also discussed with the patient that there may be a patient responsible charge related to this service. The patient expressed understanding and agreed to proceed.  Reason for visit: follow up on type 2 dm, hypertension  HPI:  73  Yr old female with type 2 DM.  Last seen April 2020 with recurrent hypoglycemia   3 month follow up on diabetes.  Patient has no complaints today.  Patient is following a low glycemic index diet and taking all prescribed medications regularly without side effects.  Fasting sugars have been under  140 fasting  and post prandials have been under 150 except on rare occasions. Patient is walking her dog daily  and intentionally trying to lose weight .  Patient has had cataract surgery on the left 7 days ago and the right is planned for July 29.  She  checks feet regularly for signs of infection.  Patient does not walk barefoot outside,  And denies any numbness tingling or burning in feet. Patient is up to date on all recommended vaccinations  8 units levemir   HTN : Patient is taking her medications as prescribed and notes no adverse effects.  Home BP readings  have been done about once per week and are  generally < 130/80 .  She is avoiding added salt in her diet and walking regularly .    Cataract surgery July 1 on left ,  Right eye surgery planned  29   ROS: See pertinent positives and negatives per HPI.  Past Medical History:  Diagnosis Date  . Actinic keratoses    follows with dermatology   . Arthritis   . Asthma   . Chronic back pain    DDD,herniated disc/spondylosis/radiculopathy  . COPD (chronic obstructive pulmonary disease) ([place])   . Diabetes mellitus    1980s  . Early cataracts, bilateral    Dr. Ruthine Dose - [facility]  . GERD (gastroesophageal reflux disease)    takes Omeprazole daily  . Hemorrhoids   . History of colonic polyps    colonoscopy 04/2012 - Dr Candace Cruise  . Hyperlipidemia    takes Pravastatin daily  . Hypertension    takes Metoprolol/HCTZ daily  . Hyperthyroidism   . Insomnia   . Joint pain    fingers  . Joint swelling   . Pneumonia    x 2 ;in Dec 2010/2011  . PONV (postoperative nausea and vomiting)   . Right leg weakness     Past Surgical History:  Procedure Laterality Date  . ABDOMINAL HYSTERECTOMY     at age 16  . BACK SURGERY  20+yrs ago  . CATARACT EXTRACTION W/PHACO Left 01/04/2019   Procedure: CATARACT EXTRACTION PHACO AND INTRAOCULAR LENS PLACEMENT (IOC)  LEFT;  Surgeon: Leandrew Koyanagi, MD;  Location: [facility];  Service: Ophthalmology;  Laterality: Left;  GIVE IV ZOFRAN  . COLONOSCOPY    . COLONOSCOPY N/A 05/13/2015   Procedure: COLONOSCOPY;  Surgeon: Hulen Luster, MD;  [facility] END[facility]COPY;  Service: Gastroenterology;  Laterality: N/A;  . ESOPHAGOGASTRODUODENOSCOPY  2011  . ESOPHAGOGASTRODUODENOSCOPY (EGD) WITH PROPOFOL N/A 09/02/2017   Procedure: ESOPHAGOGASTRODUODENOSCOPY (EGD) WITH PROPOFOL;  Surgeon: Virgel Manifold, MD;  Location: [facility] ENDOSCOPY;  Service: Endoscopy;  Laterality: N/A;  . FOOT SURGERY  2011   bunionectomy and knot removed from bottom of both  feet  . JOINT REPLACEMENT Left 2014   hip  Kraskinski  . LUMBAR LAMINECTOMY/DECOMPRESSION MICRODISCECTOMY  09/10/2011   Procedure: LUMBAR LAMINECTOMY/DECOMPRESSION MICRODISCECTOMY 1 LEVEL;  Surgeon: Hosie Spangle, MD;  Location: [place] [facility];  Service: Neurosurgery;  Laterality: Right;  RIGHT Lumbar  Laminotomy and microdiskectomy Lumbar Four-Five  . LUMBAR LAMINECTOMY/DECOMPRESSION MICRODISCECTOMY N/A 06/26/2015   Procedure: LUMBAR LAMINECTOMY/DECOMPRESSION MICRODISCECTOMY 1 LEVEL;  Surgeon: Jovita Gamma, MD;  Location: [place] [facility];  Service: Neurosurgery;  Laterality: N/A;  L3 and L4 Laminectomies  . MELANOMA EXCISION  2011   on nose/back on neck  . THORACIC DISC SURGERY  at age 6 and 70    mass of veins that had gotten into muscle and wrapped around rib;3 ribs also removed from left side  . TONSILLECTOMY  as a child   and adenoids   . TOTAL HIP ARTHROPLASTY Left 2014   Dr. Mack Guise  . TUBAL LIGATION    . VAGINA SURGERY     vaginal wall ruptured     Family History  Problem Relation Age of Onset  . Heart disease Brother        valve replacement  . COPD Brother   . COPD Mother        Emphysema  . Diabetes Mother   . Other Mother        Died in house fire at age 26.  Marland Kitchen Heart disease Father   . Seizures Father   . Cancer Father        Lung cancer with brain metastasis  . Asthma Brother   . COPD Son 35  . Cancer Other        aunt ? maternal vs paternal side lung cancer   . Diabetes Maternal Grandmother   . Anesthesia problems Neg Hx   . Hypotension Neg Hx   . Malignant hyperthermia Neg Hx   . Pseudochol deficiency Neg Hx   . Thyroid disease Neg Hx   . Breast cancer Neg Hx     SOCIAL HX:  reports that she quit smoking about 2 years ago. Her smoking use included cigarettes. She has a 5.00 pack-year smoking history. She has never used smokeless tobacco. She reports that she does not drink alcohol or use drugs.  Current Outpatient Medications:  .  albuterol  (PROVENTIL HFA;VENTOLIN HFA) 108 (90 Base) MCG/ACT inhaler, Inhale 2 puffs into the lungs every 6 (six) hours as needed for wheezing., Disp: 6.7 g, Rfl: 11 .  ALPRAZolam (XANAX) 0.25 MG tablet, TAKE 1 TABLET BY MOUTH TWICE A DAY AS NEEDED, Disp: 60 tablet, Rfl: 2 .  aspirin EC 81 MG tablet, Take 81 mg by mouth daily., Disp: , Rfl:  .  atenolol (TENORMIN) 50 MG tablet, TAKE 1 TABLET (50 MG TOTAL) BY MOUTH 2 (TWO) TIMES DAILY., Disp: 180 tablet, Rfl: 1 .  atorvastatin (LIPITOR) 40 MG tablet, TAKE 1 TABLET  BY MOUTH DAILY AT 6 PM., Disp: 90 tablet, Rfl: 3 .  glucose blood (ONETOUCH VERIO) test strip, Use as instructed, Disp: 100 each, Rfl: 12 .  hydrochlorothiazide (HYDRODIURIL) 12.5 MG tablet, TAKE 1 TABLET (12.5 MG TOTAL) BY MOUTH DAILY., Disp: 90 tablet, Rfl: 3 .  Insulin Pen Needle (BD PEN NEEDLE NANO U/F) 32G X 4 MM MISC, USE DAILY AS DIRECTED with levemir, Disp: 100 each, Rfl: 1 .  Lancets (ONETOUCH ULTRASOFT) lancets, Use as instructed, Disp: 100 each, Rfl: 12 .  LEVEMIR FLEXTOUCH 100 UNIT/ML Pen, INJECT 15 UNITS INTO THE SKIN DAILY AT 10 PM., Disp: 15 mL, Rfl: 2 .  losartan (COZAAR) 50 MG tablet, Take 1 tablet (50 mg total) by mouth daily., Disp: 90 tablet, Rfl: 3 .  metFORMIN (GLUCOPHAGE) 1000 MG tablet, TAKE 1 TABLET BY MOUTH 2 TIMES DAILY WITH A MEAL., Disp: 180 tablet, Rfl: 1 .  methimazole (TAPAZOLE) 10 MG tablet, TAKE 1 TABLET BY MOUTH TWICE A DAY (Patient taking differently: Take 10 mg by mouth See admin instructions. Take 1 tablet by mouth one day and 1/2 tab by mouth the next day, alternating.), Disp: 180 tablet, Rfl: 1 .  omeprazole (PRILOSEC) 40 MG capsule, TAKE 1 CAPSULE BY MOUTH EVERY DAY, Disp: 90 capsule, Rfl: 1 .  traMADol (ULTRAM) 50 MG tablet, TAKE 1 TABLET BY MOUTH EVERY 6 HOURS AS NEEDED FOR SPINAL STENOSIS PAIN, Disp: 120 tablet, Rfl: 0  EXAM:  General impression: alert, cooperative and articulate.  No signs of being in distress  Lungs: speech is fluent sentence length  suggests that patient is not short of breath and not punctuated by cough, sneezing or sniffing. Marland Kitchen   Psych: affect normal.  speech is articulate and non pressured .  Denies suicidal thoughts    ASSESSMENT AND PLAN:  Discussed the following assessment and plan:  Hyperthyroidism TSH remains elevated on methimazole reduced dose.  She will follow up with Endocrine   Lab Results  Component Value Date   TSH 7.25 (H) 01/10/2019     HTN (hypertension), benign Well controlled on current regimen of hct and atenolol bid, and losarta . Renal function stable, no changes today.  Lab Results  Component Value Date   CREATININE 0.80 10/26/2018   Lab Results  Component Value Date   NA 134 (L) 10/26/2018   K 4.0 10/26/2018   CL 94 (L) 10/26/2018   CO2 29 10/26/2018   Lab Results  Component Value Date   MICROALBUR <0.7 10/26/2018    DM type 2 (diabetes mellitus, type 2) ([place]) With resolution of  mild hypoglycemia since  levemir dose was reduced to 8 units daily at last visit,  Continue metformin and repeat a1c in October.     Lab Results  Component Value Date   HGBA1C 6.9 (H) 10/26/2018       I discussed the assessment and treatment plan with the patient. The patient was provided an opportunity to ask questions and all were answered. The patient agreed with the plan and demonstrated an understanding of the instructions.   The patient was advised to call back or seek an in-person evaluation if the symptoms worsen or if the condition fails to improve as anticipated.  I provided 22 minutes of non-face-to-face time during this encounter.   Crecencio Mc, MD

## 2019-01-11 NOTE — Assessment & Plan Note (Signed)
With resolution of  mild hypoglycemia since  levemir dose was reduced to 8 units daily at last visit,  Continue metformin and repeat a1c in October.     Lab Results  Component Value Date   HGBA1C 6.9 (H) 10/26/2018

## 2019-01-11 NOTE — Assessment & Plan Note (Signed)
TSH remains elevated on methimazole reduced dose.  She will follow up with Endocrine   Lab Results  Component Value Date   TSH 7.25 (H) 01/10/2019

## 2019-01-11 NOTE — Assessment & Plan Note (Signed)
Well controlled on current regimen of hct and atenolol bid, and losarta . Renal function stable, no changes today.  Lab Results  Component Value Date   CREATININE 0.80 10/26/2018   Lab Results  Component Value Date   NA 134 (L) 10/26/2018   K 4.0 10/26/2018   CL 94 (L) 10/26/2018   CO2 29 10/26/2018   Lab Results  Component Value Date   MICROALBUR <0.7 10/26/2018

## 2019-01-12 DIAGNOSIS — J449 Chronic obstructive pulmonary disease, unspecified: Secondary | ICD-10-CM | POA: Diagnosis not present

## 2019-01-12 DIAGNOSIS — H2511 Age-related nuclear cataract, right eye: Secondary | ICD-10-CM | POA: Diagnosis not present

## 2019-01-17 ENCOUNTER — Other Ambulatory Visit: Payer: Self-pay

## 2019-01-17 ENCOUNTER — Ambulatory Visit: Payer: Medicare HMO | Admitting: Endocrinology

## 2019-01-17 VITALS — BP 128/72 | HR 72 | Ht 64.0 in | Wt 154.4 lb

## 2019-01-17 DIAGNOSIS — E0590 Thyrotoxicosis, unspecified without thyrotoxic crisis or storm: Secondary | ICD-10-CM

## 2019-01-17 NOTE — Progress Notes (Signed)
Patient ID: Maria Hinton, female   DOB: [birth year], 73 y.o.   MRN: [id number]                                                                                                              Reason for Appointment:  Hyperthyroidism, follow-up visit   Referring physician: Derrel Nip   History of Present Illness:   Prior history:  For several months before her consultation she had symptoms of weakness, palpitations, shakiness, feeling sweaty in am, nervousness, and fatigue.  Also would feel a strong heartbeat in her ears She had been on atenolol for hypertension also She had a decreased appetite and started losing weight late last summer  The patient had lost about  26 lbs since these symptoms started   RECENT history:  She is taking methimazole since 3/19 for her hyperthyroidism On her 10/2017 visit she was taking 10 mg twice daily because of hyperthyroidism she was given 30 mg daily  In follow-up she has been requiring periodically reduced doses of her methimazole  However since late 2019 she appears to be needing progressively higher doses of methimazole  On her visit in 12/19 she was having fatigue and weakness; did not complain of feeling shaky or having palpitations, no weight loss However her thyroid levels were significantly high with 7.5 mg of methimazole The dose was increased  RECENT history:  On her visit in 4/20 she was having problems with feeling sleepy and tired and having less motivation She did not have any hair loss or weight change However her TSH with taking 15 mg methimazole was significantly higher at 14.5 and low normal free T4  Subsequently her methimazole has been reduced progressively and now taking only 5 mg daily  She does not complain of any unusual fatigue, no recent weight change No cold intolerance or hair loss  Even with reducing her methimazole her TSH is now seven-point compared to 6.8 Free T4 is normal  Wt Readings from Last 3 Encounters:    01/17/19 154 lb 6.4 oz (70 kg)  01/04/19 156 lb (70.8 kg)  12/07/18 153 lb 6.4 oz (69.6 kg)     Thyrotropin receptor antibody mildly increased in March  Thyroid function tests as follows:     Lab Results  Component Value Date   FREET4 0.92 01/10/2019   FREET4 0.81 11/30/2018   FREET4 0.61 10/26/2018   T3FREE 3.1 11/30/2018   T3FREE 3.0 10/26/2018   T3FREE 3.3 07/20/2018   TSH 7.25 (H) 01/10/2019   TSH 6.85 (H) 11/30/2018   TSH 14.52 (H) 10/26/2018    Lab Results  Component Value Date   THYROTRECAB 2.53 (H) 09/07/2018   THYROTRECAB 2.05 (H) 06/21/2018   THYROTRECAB 2.79 (H) 02/28/2018     Allergies as of 01/17/2019      Reactions   Vicodin [hydrocodone-acetaminophen] Other (See Comments)   Insomnia       Medication List       Accurate as of January 17, 2019  5:05 PM. If you have any questions,  ask your nurse or doctor.        albuterol 108 (90 Base) MCG/ACT inhaler Commonly known as: VENTOLIN HFA Inhale 2 puffs into the lungs every 6 (six) hours as needed for wheezing.   ALPRAZolam 0.25 MG tablet Commonly known as: XANAX TAKE 1 TABLET BY MOUTH TWICE A DAY AS NEEDED   aspirin EC 81 MG tablet Take 81 mg by mouth daily.   atenolol 50 MG tablet Commonly known as: TENORMIN TAKE 1 TABLET (50 MG TOTAL) BY MOUTH 2 (TWO) TIMES DAILY.   atorvastatin 40 MG tablet Commonly known as: LIPITOR TAKE 1 TABLET BY MOUTH DAILY AT 6 PM.   glucose blood test strip Commonly known as: OneTouch Verio Use as instructed   hydrochlorothiazide 12.5 MG tablet Commonly known as: HYDRODIURIL TAKE 1 TABLET (12.5 MG TOTAL) BY MOUTH DAILY.   Insulin Pen Needle 32G X 4 MM Misc Commonly known as: BD Pen Needle Nano U/F USE DAILY AS DIRECTED with levemir   Levemir FlexTouch 100 UNIT/ML Pen Generic drug: Insulin Detemir INJECT 15 UNITS INTO THE SKIN DAILY AT 10 PM.   losartan 50 MG tablet Commonly known as: COZAAR Take 1 tablet (50 mg total) by mouth daily.   metFORMIN 1000 MG  tablet Commonly known as: GLUCOPHAGE TAKE 1 TABLET BY MOUTH 2 TIMES DAILY WITH A MEAL.   methimazole 10 MG tablet Commonly known as: TAPAZOLE TAKE 1 TABLET BY MOUTH TWICE A DAY What changed:   when to take this  additional instructions   omeprazole 40 MG capsule Commonly known as: PRILOSEC TAKE 1 CAPSULE BY MOUTH EVERY DAY   onetouch ultrasoft lancets Use as instructed   traMADol 50 MG tablet Commonly known as: ULTRAM TAKE 1 TABLET BY MOUTH EVERY 6 HOURS AS NEEDED FOR SPINAL STENOSIS PAIN           Past Medical History:  Diagnosis Date   Actinic keratoses    follows with dermatology    Arthritis    Asthma    Chronic back pain    DDD,herniated disc/spondylosis/radiculopathy   COPD (chronic obstructive pulmonary disease) ([facility])    Diabetes mellitus    1980s   Early cataracts, bilateral    Dr. [name] [place]   GERD (gastroesophageal reflux disease)    takes Omeprazole daily   Hemorrhoids    History of colonic polyps    colonoscopy 04/2012 - Dr Candace Cruise   Hyperlipidemia    takes Pravastatin daily   Hypertension    takes Metoprolol/HCTZ daily   Hyperthyroidism    Insomnia    Joint pain    fingers   Joint swelling    Pneumonia    x 2 ;in Dec 2010/2011   PONV (postoperative nausea and vomiting)    Right leg weakness     Past Surgical History:  Procedure Laterality Date   ABDOMINAL HYSTERECTOMY     at age 65   BACK SURGERY  20+yrs ago   CATARACT EXTRACTION W/PHACO Left 01/04/2019   Procedure: CATARACT EXTRACTION PHACO AND INTRAOCULAR LENS PLACEMENT ([place])  LEFT;  Surgeon: Leandrew Koyanagi, MD;  Location: [facility];  Service: Ophthalmology;  Laterality: Left;  GIVE IV ZOFRAN   COLONOSCOPY     COLONOSCOPY N/A 05/13/2015   Procedure: COLONOSCOPY;  Surgeon: Hulen Luster, MD;  Location: [facility] ENDOSCOPY;  Service: Gastroenterology;  Laterality: N/A;   ESOPHAGOGASTRODUODENOSCOPY  2011   ESOPHAGOGASTRODUODENOSCOPY (EGD)  WITH PROPOFOL N/A 09/02/2017   Procedure: ESOPHAGOGASTRODUODENOSCOPY (EGD) WITH PROPOFOL;  Surgeon: Bonna Gains,  Lennette Bihari, MD;  Location: [facility] ENDOSCOPY;  Service: Endoscopy;  Laterality: N/A;   FOOT SURGERY  2011   bunionectomy and knot removed from bottom of both feet   JOINT REPLACEMENT Left 2014   hip  Kraskinski   LUMBAR LAMINECTOMY/DECOMPRESSION MICRODISCECTOMY  09/10/2011   Procedure: LUMBAR LAMINECTOMY/DECOMPRESSION MICRODISCECTOMY 1 LEVEL;  Surgeon: Hosie Spangle, MD;  Location: [place] [facility];  Service: Neurosurgery;  Laterality: Right;  RIGHT Lumbar  Laminotomy and microdiskectomy Lumbar Four-Five   LUMBAR LAMINECTOMY/DECOMPRESSION MICRODISCECTOMY N/A 06/26/2015   Procedure: LUMBAR LAMINECTOMY/DECOMPRESSION MICRODISCECTOMY 1 LEVEL;  Surgeon: Jovita Gamma, MD;  Location: [place] NEURO ORS;  Service: Neurosurgery;  Laterality: N/A;  L3 and L4 Laminectomies   MELANOMA EXCISION  2011   on nose/back on neck   THORACIC DISC SURGERY  at age 89 and 53    mass of veins that had gotten into muscle and wrapped around rib;3 ribs also removed from left side   TONSILLECTOMY  as a child   and adenoids    TOTAL HIP ARTHROPLASTY Left 2014   Dr. Mack Guise   TUBAL LIGATION     VAGINA SURGERY     vaginal wall ruptured     Family History  Problem Relation Age of Onset   Heart disease Brother        valve replacement   COPD Brother    COPD Mother        Emphysema   Diabetes Mother    Other Mother        Died in house fire at age 81.   Heart disease Father    Seizures Father    Cancer Father        Lung cancer with brain metastasis   Asthma Brother    COPD Son 65   Cancer Other        aunt ? maternal vs paternal side lung cancer    Diabetes Maternal Grandmother    Anesthesia problems Neg Hx    Hypotension Neg Hx    Malignant hyperthermia Neg Hx    Pseudochol deficiency Neg Hx    Thyroid disease Neg Hx    Breast cancer Neg Hx     Social History:  reports  that she quit smoking about 2 years ago. Her smoking use included cigarettes. She has a 5.00 pack-year smoking history. She has never used smokeless tobacco. She reports that she does not drink alcohol or use drugs.  Allergies:  Allergies  Allergen Reactions   Vicodin [Hydrocodone-Acetaminophen] Other (See Comments)    Insomnia      Review of Systems  She is on insulin for diabetes and followed by PCP A1c as follows, due to follow-up with PCP in October   Lab Results  Component Value Date   HGBA1C 6.9 (H) 10/26/2018   HGBA1C 6.4 (A) 04/11/2018   HGBA1C 6.4 (A) 02/25/2018   Lab Results  Component Value Date   MICROALBUR <0.7 10/26/2018   LDLCALC 65 10/26/2018   CREATININE 0.80 10/26/2018       Examination:   BP 128/72 (BP Location: Left Arm, Patient Position: Sitting, Cuff Size: Normal)    Pulse 72    Ht 5\' 4"  (1.626 m)    Wt 154 lb 6.4 oz (70 kg)    SpO2 96%    BMI 26.50 kg/m   Thyroid not palpable Biceps reflexes appear normal No peripheral edema    Assessment/Plan:   Hyperthyroidism, diagnosed to be from Graves' disease, on antithyroid drug treatment since 09/2017 No  previous history of goiter  With lowering her methimazole progressively in the last few months she still appears to be hypothyroid judging from her labs  TSH is persistently high with 5 mg methimazole She is subjectively doing fairly well however  Discussed that she is likely in remission now and she can stop the 5 mg methimazole dose  Follow-up in 6 weeks If she has normal labs and follow-up then she can have a final visit in 6 months She will call if she has any recurrence of hyperthyroid symptoms and these were reviewed  Continue follow-up with PCP for diabetes for now   Elayne Snare 01/17/2019, 5:05 PM    Note: This office note was prepared with Dragon voice recognition system technology. Any transcriptional errors that result from this process are unintentional.

## 2019-01-23 DIAGNOSIS — X32XXXA Exposure to sunlight, initial encounter: Secondary | ICD-10-CM | POA: Diagnosis not present

## 2019-01-23 DIAGNOSIS — L820 Inflamed seborrheic keratosis: Secondary | ICD-10-CM | POA: Diagnosis not present

## 2019-01-23 DIAGNOSIS — L570 Actinic keratosis: Secondary | ICD-10-CM | POA: Diagnosis not present

## 2019-01-23 DIAGNOSIS — L821 Other seborrheic keratosis: Secondary | ICD-10-CM | POA: Diagnosis not present

## 2019-01-23 DIAGNOSIS — L538 Other specified erythematous conditions: Secondary | ICD-10-CM | POA: Diagnosis not present

## 2019-01-26 ENCOUNTER — Other Ambulatory Visit: Payer: Medicare HMO

## 2019-01-26 NOTE — Discharge Instructions (Signed)

## 2019-01-27 ENCOUNTER — Other Ambulatory Visit
Admission: RE | Admit: 2019-01-27 | Discharge: 2019-01-27 | Disposition: A | Discharge status: Home / Self Care | Payer: Medicare HMO | Source: Ambulatory Visit | Attending: Ophthalmology | Admitting: Ophthalmology

## 2019-01-27 ENCOUNTER — Other Ambulatory Visit: Payer: Self-pay

## 2019-01-27 DIAGNOSIS — Z1159 Encounter for screening for other viral diseases: Secondary | ICD-10-CM | POA: Insufficient documentation

## 2019-01-28 LAB — SARS CORONAVIRUS 2 (TAT 6-24 HRS): SARS Coronavirus 2: NEGATIVE

## 2019-01-30 NOTE — Anesthesia Preprocedure Evaluation (Addendum)
Anesthesia Evaluation  Patient identified by MRN, date of birth, ID band Patient awake    Reviewed: Allergy & Precautions, NPO status , Patient's Chart, lab work & pertinent test results  History of Anesthesia Complications (+) PONV and history of anesthetic complications  Airway Mallampati: I   Neck ROM: Full    Dental  (+)    Pulmonary COPD, former smoker (quit 2018),    Pulmonary exam normal breath sounds clear to auscultation       Cardiovascular hypertension, Normal cardiovascular exam Rhythm:Regular Rate:Normal     Neuro/Psych  Headaches, PSYCHIATRIC DISORDERS Anxiety    GI/Hepatic hiatal hernia, GERD  ,  Endo/Other  diabetes, Type 2Hyperthyroidism (Graves disease, on methimazole)   Renal/GU negative Renal ROS     Musculoskeletal  (+) Arthritis ,   Abdominal   Peds  Hematology negative hematology ROS (+)   Anesthesia Other Findings   Reproductive/Obstetrics                            Anesthesia Physical Anesthesia Plan  ASA: III  Anesthesia Plan: MAC   Post-op Pain Management:    Induction: Intravenous  PONV Risk Score and Plan: 3 and TIVA and Midazolam  Airway Management Planned: Natural Airway  Additional Equipment:   Intra-op Plan:   Post-operative Plan:   Informed Consent: I have reviewed the patients History and Physical, chart, labs and discussed the procedure including the risks, benefits and alternatives for the proposed anesthesia with the patient or authorized representative who has indicated his/her understanding and acceptance.       Plan Discussed with: CRNA  Anesthesia Plan Comments:        Anesthesia Quick Evaluation

## 2019-02-01 ENCOUNTER — Encounter
Admission: RE | Disposition: A | Discharge status: Home / Self Care | Payer: Self-pay | Source: Home / Self Care | Attending: Ophthalmology

## 2019-02-01 ENCOUNTER — Ambulatory Visit
Admission: RE | Admit: 2019-02-01 | Discharge: 2019-02-01 | Disposition: A | Discharge status: Home / Self Care | Payer: Medicare HMO | Attending: Ophthalmology | Admitting: Ophthalmology

## 2019-02-01 ENCOUNTER — Ambulatory Visit: Payer: Medicare HMO | Admitting: Anesthesiology

## 2019-02-01 ENCOUNTER — Other Ambulatory Visit: Payer: Self-pay

## 2019-02-01 DIAGNOSIS — K219 Gastro-esophageal reflux disease without esophagitis: Secondary | ICD-10-CM | POA: Insufficient documentation

## 2019-02-01 DIAGNOSIS — E0500 Thyrotoxicosis with diffuse goiter without thyrotoxic crisis or storm: Secondary | ICD-10-CM | POA: Insufficient documentation

## 2019-02-01 DIAGNOSIS — Z885 Allergy status to narcotic agent status: Secondary | ICD-10-CM | POA: Insufficient documentation

## 2019-02-01 DIAGNOSIS — E7800 Pure hypercholesterolemia, unspecified: Secondary | ICD-10-CM | POA: Insufficient documentation

## 2019-02-01 DIAGNOSIS — E1136 Type 2 diabetes mellitus with diabetic cataract: Secondary | ICD-10-CM | POA: Insufficient documentation

## 2019-02-01 DIAGNOSIS — E079 Disorder of thyroid, unspecified: Secondary | ICD-10-CM | POA: Diagnosis not present

## 2019-02-01 DIAGNOSIS — M1990 Unspecified osteoarthritis, unspecified site: Secondary | ICD-10-CM | POA: Diagnosis not present

## 2019-02-01 DIAGNOSIS — F419 Anxiety disorder, unspecified: Secondary | ICD-10-CM | POA: Diagnosis not present

## 2019-02-01 DIAGNOSIS — R51 Headache: Secondary | ICD-10-CM | POA: Diagnosis not present

## 2019-02-01 DIAGNOSIS — Z87891 Personal history of nicotine dependence: Secondary | ICD-10-CM | POA: Insufficient documentation

## 2019-02-01 DIAGNOSIS — H25811 Combined forms of age-related cataract, right eye: Secondary | ICD-10-CM | POA: Diagnosis not present

## 2019-02-01 DIAGNOSIS — I10 Essential (primary) hypertension: Secondary | ICD-10-CM | POA: Insufficient documentation

## 2019-02-01 DIAGNOSIS — H2511 Age-related nuclear cataract, right eye: Secondary | ICD-10-CM | POA: Diagnosis not present

## 2019-02-01 DIAGNOSIS — J449 Chronic obstructive pulmonary disease, unspecified: Secondary | ICD-10-CM | POA: Insufficient documentation

## 2019-02-01 DIAGNOSIS — K449 Diaphragmatic hernia without obstruction or gangrene: Secondary | ICD-10-CM | POA: Insufficient documentation

## 2019-02-01 DIAGNOSIS — R69 Illness, unspecified: Secondary | ICD-10-CM | POA: Diagnosis not present

## 2019-02-01 HISTORY — PX: CATARACT EXTRACTION W/PHACO: SHX586

## 2019-02-01 LAB — GLUCOSE, CAPILLARY
Glucose-Capillary: 133 mg/dL — ABNORMAL HIGH (ref 70–99)
Glucose-Capillary: 137 mg/dL — ABNORMAL HIGH (ref 70–99)

## 2019-02-01 SURGERY — PHACOEMULSIFICATION, CATARACT, WITH IOL INSERTION
Anesthesia: Monitor Anesthesia Care | Site: Eye | Laterality: Right

## 2019-02-01 MED ORDER — FENTANYL CITRATE (PF) 100 MCG/2ML IJ SOLN
INTRAMUSCULAR | Status: DC | PRN
  Administered 2019-02-01 (×2): 50 ug via INTRAVENOUS

## 2019-02-01 MED ORDER — NA HYALUR & NA CHOND-NA HYALUR 0.4-0.35 ML IO KIT
PACK | INTRAOCULAR | Status: DC | PRN
  Administered 2019-02-01: 1 mL via INTRAOCULAR

## 2019-02-01 MED ORDER — MOXIFLOXACIN HCL 0.5 % OP SOLN
1.0000 [drp] | OPHTHALMIC | Status: DC | PRN
  Administered 2019-02-01 (×3): 1 [drp] via OPHTHALMIC

## 2019-02-01 MED ORDER — MIDAZOLAM HCL 2 MG/2ML IJ SOLN
INTRAMUSCULAR | Status: DC | PRN
  Administered 2019-02-01 (×2): 1 mg via INTRAVENOUS

## 2019-02-01 MED ORDER — ONDANSETRON HCL 4 MG/2ML IJ SOLN
INTRAMUSCULAR | Status: DC | PRN
  Administered 2019-02-01: 4 mg via INTRAVENOUS

## 2019-02-01 MED ORDER — LIDOCAINE HCL (PF) 2 % IJ SOLN
INTRAOCULAR | Status: DC | PRN
  Administered 2019-02-01: 1 mL

## 2019-02-01 MED ORDER — EPINEPHRINE PF 1 MG/ML IJ SOLN
INTRAOCULAR | Status: DC | PRN
  Administered 2019-02-01: 60 mL via OPHTHALMIC

## 2019-02-01 MED ORDER — ACETAMINOPHEN 160 MG/5ML PO SOLN: 325.0000 mg | ORAL | Status: DC | PRN

## 2019-02-01 MED ORDER — ACETAMINOPHEN 325 MG PO TABS: 650.0000 mg | ORAL_TABLET | Freq: Once | ORAL | Status: DC | PRN

## 2019-02-01 MED ORDER — ARMC OPHTHALMIC DILATING DROPS
1.0000 "application " | OPHTHALMIC | Status: DC | PRN
  Administered 2019-02-01 (×3): 1 via OPHTHALMIC

## 2019-02-01 MED ORDER — BRIMONIDINE TARTRATE-TIMOLOL 0.2-0.5 % OP SOLN
OPHTHALMIC | Status: DC | PRN
  Administered 2019-02-01: 1 [drp] via OPHTHALMIC

## 2019-02-01 MED ORDER — ONDANSETRON HCL 4 MG/2ML IJ SOLN: 4.0000 mg | Freq: Once | INTRAMUSCULAR | Status: DC | PRN

## 2019-02-01 MED ORDER — CEFUROXIME OPHTHALMIC INJECTION 1 MG/0.1 ML
INJECTION | OPHTHALMIC | Status: DC | PRN
  Administered 2019-02-01: 0.1 mL via INTRACAMERAL

## 2019-02-01 MED ORDER — TETRACAINE HCL 0.5 % OP SOLN
1.0000 [drp] | OPHTHALMIC | Status: DC | PRN
  Administered 2019-02-01 (×3): 1 [drp] via OPHTHALMIC

## 2019-02-01 SURGICAL SUPPLY — 26 items
CANNULA ANT/CHMB 27G (MISCELLANEOUS) ×1 IMPLANT
CANNULA ANT/CHMB 27GA (MISCELLANEOUS) ×2 IMPLANT
GLOVE SURG LX 7.5 STRW (GLOVE) ×2
GLOVE SURG LX STRL 7.5 STRW (GLOVE) ×1 IMPLANT
GLOVE SURG TRIUMPH 8.0 PF LTX (GLOVE) ×2 IMPLANT
GOWN STRL REUS W/ TWL LRG LVL3 (GOWN DISPOSABLE) ×2 IMPLANT
GOWN STRL REUS W/TWL LRG LVL3 (GOWN DISPOSABLE) ×2
LENS IOL TECNIS ITEC 21.5 (Intraocular Lens) ×1 IMPLANT
MARKER SKIN DUAL TIP RULER LAB (MISCELLANEOUS) ×2 IMPLANT
NDL FILTER BLUNT 18X1 1/2 (NEEDLE) ×1 IMPLANT
NDL RETROBULBAR .5 NSTRL (NEEDLE) IMPLANT
NEEDLE FILTER BLUNT 18X 1/2SAF (NEEDLE) ×1
NEEDLE FILTER BLUNT 18X1 1/2 (NEEDLE) ×1 IMPLANT
PACK CATARACT BRASINGTON (MISCELLANEOUS) ×2 IMPLANT
PACK EYE AFTER SURG (MISCELLANEOUS) ×2 IMPLANT
PACK OPTHALMIC (MISCELLANEOUS) ×2 IMPLANT
RING MALYGIN 7.0 (MISCELLANEOUS) IMPLANT
SUT ETHILON 10-0 CS-B-6CS-B-6 (SUTURE)
SUT VICRYL  9 0 (SUTURE)
SUT VICRYL 9 0 (SUTURE) IMPLANT
SUTURE EHLN 10-0 CS-B-6CS-B-6 (SUTURE) IMPLANT
SYR 3ML LL SCALE MARK (SYRINGE) ×2 IMPLANT
SYR 5ML LL (SYRINGE) ×2 IMPLANT
SYR TB 1ML LUER SLIP (SYRINGE) ×2 IMPLANT
WATER STERILE IRR 500ML POUR (IV SOLUTION) ×2 IMPLANT
WIPE NON LINTING 3.25X3.25 (MISCELLANEOUS) ×2 IMPLANT

## 2019-02-01 NOTE — Op Note (Signed)
LOCATION:  [facility]   PREOPERATIVE DIAGNOSIS:    Nuclear sclerotic cataract right eye. H25.11   POSTOPERATIVE DIAGNOSIS:  Nuclear sclerotic cataract right eye.     PROCEDURE:  Phacoemusification with posterior chamber intraocular lens placement of the right eye   LENS:   Implant Name Type Inv. Item Serial No. Manufacturer Lot No. LRB No. Used Action  LENS IOL DIOP 21.5 - [id number] Intraocular Lens LENS IOL DIOP 21.5 [id number] AMO  Right 1 Implanted        ULTRASOUND TIME: 13 % of 1 minutes, 3 seconds.  CDE 8.5   SURGEON:  Wyonia Hough, MD   ANESTHESIA:  Topical with tetracaine drops and 2% Xylocaine jelly, augmented with 1% preservative-free intracameral lidocaine.    COMPLICATIONS:  None.   DESCRIPTION OF PROCEDURE:  The patient was identified in the holding room and transported to the operating room and placed in the supine position under the operating microscope.  The right eye was identified as the operative eye and it was prepped and draped in the usual sterile ophthalmic fashion.   A 1 millimeter clear-corneal paracentesis was made at the 12:00 position.  0.5 ml of preservative-free 1% lidocaine was injected into the anterior chamber. The anterior chamber was filled with Viscoat viscoelastic.  A 2.4 millimeter keratome was used to make a near-clear corneal incision at the 9:00 position.  A curvilinear capsulorrhexis was made with a cystotome and capsulorrhexis forceps.  Balanced salt solution was used to hydrodissect and hydrodelineate the nucleus.   Phacoemulsification was then used in stop and chop fashion to remove the lens nucleus and epinucleus.  The remaining cortex was then removed using the irrigation and aspiration handpiece. Provisc was then placed into the capsular bag to distend it for lens placement.  A lens was then injected into the capsular bag.  The remaining viscoelastic was aspirated.   Wounds were hydrated with balanced salt solution.   The anterior chamber was inflated to a physiologic pressure with balanced salt solution.  No wound leaks were noted. Cefuroxime 0.1 ml of a 10mg /ml solution was injected into the anterior chamber for a dose of 1 mg of intracameral antibiotic at the completion of the case.   Timolol and Brimonidine drops were applied to the eye.  The patient was taken to the recovery room in stable condition without complications of anesthesia or surgery.   , 02/01/2019, 11:52 AM

## 2019-02-01 NOTE — Transfer of Care (Signed)
Immediate Anesthesia Transfer of Care Note  Patient: Maria Hinton  Procedure(s) Performed: CATARACT EXTRACTION PHACO AND INTRAOCULAR LENS PLACEMENT (IOC)  RIGHT DIABETIC (Right Eye)  Patient Location: PACU  Anesthesia Type: MAC  Level of Consciousness: awake, alert  and patient cooperative  Airway and Oxygen Therapy: Patient Spontanous Breathing and Patient connected to supplemental oxygen  Post-op Assessment: Post-op Vital signs reviewed, Patient's Cardiovascular Status Stable, Respiratory Function Stable, Patent Airway and No signs of Nausea or vomiting  Post-op Vital Signs: Reviewed and stable  Complications: No apparent anesthesia complications

## 2019-02-01 NOTE — Anesthesia Procedure Notes (Signed)
Procedure Name: MAC Date/Time: 02/01/2019 11:35 AM Performed by: Cameron Ali, CRNA Pre-anesthesia Checklist: Patient identified, Emergency Drugs available, Suction available, Timeout performed and Patient being monitored Patient Re-evaluated:Patient Re-evaluated prior to induction Oxygen Delivery Method: Nasal cannula Placement Confirmation: positive ETCO2

## 2019-02-01 NOTE — Anesthesia Postprocedure Evaluation (Signed)
Anesthesia Post Note  Patient: Maria Hinton  Procedure(s) Performed: CATARACT EXTRACTION PHACO AND INTRAOCULAR LENS PLACEMENT (IOC)  RIGHT DIABETIC (Right Eye)  Patient location during evaluation: PACU Anesthesia Type: MAC Level of consciousness: awake and alert, oriented and patient cooperative Pain management: pain level controlled Vital Signs Assessment: post-procedure vital signs reviewed and stable Respiratory status: spontaneous breathing, nonlabored ventilation and respiratory function stable Cardiovascular status: blood pressure returned to baseline and stable Postop Assessment: adequate PO intake Anesthetic complications: no    Darrin Nipper

## 2019-02-01 NOTE — H&P (Signed)

## 2019-02-02 ENCOUNTER — Encounter: Payer: Self-pay | Admitting: Ophthalmology

## 2019-02-07 ENCOUNTER — Other Ambulatory Visit: Payer: Self-pay | Admitting: Internal Medicine

## 2019-02-07 ENCOUNTER — Telehealth: Payer: Self-pay

## 2019-02-07 NOTE — Telephone Encounter (Signed)
Copied from [place] [id number]. Topic: Appointment Scheduling - Scheduling Inquiry for Clinic >> Feb 07, 2019  1:45 PM Scherrie Gerlach wrote: Reason for CRM:  pt called to have her labs done at this office from dr Ronnie Derby order. Advised pt due to covid restrictions, we are not doing other office's labs at this time

## 2019-02-08 NOTE — Telephone Encounter (Signed)
Tramadol   Refilled: 12/14/2018 Alprazolam   Refilled: 11/08/2018  Last OV: 01/10/2019 Next OV: 04/17/2019

## 2019-02-22 ENCOUNTER — Other Ambulatory Visit (INDEPENDENT_AMBULATORY_CARE_PROVIDER_SITE_OTHER): Payer: Medicare HMO

## 2019-02-22 ENCOUNTER — Other Ambulatory Visit: Payer: Self-pay

## 2019-02-22 DIAGNOSIS — E0590 Thyrotoxicosis, unspecified without thyrotoxic crisis or storm: Secondary | ICD-10-CM | POA: Diagnosis not present

## 2019-02-22 LAB — TSH: TSH: 0.04 u[IU]/mL — ABNORMAL LOW (ref 0.35–4.50)

## 2019-02-22 LAB — T4, FREE: Free T4: 1.3 ng/dL (ref 0.60–1.60)

## 2019-02-23 ENCOUNTER — Other Ambulatory Visit (INDEPENDENT_AMBULATORY_CARE_PROVIDER_SITE_OTHER): Payer: Medicare HMO

## 2019-02-23 ENCOUNTER — Other Ambulatory Visit: Payer: Self-pay | Admitting: Endocrinology

## 2019-02-23 DIAGNOSIS — E0590 Thyrotoxicosis, unspecified without thyrotoxic crisis or storm: Secondary | ICD-10-CM

## 2019-02-23 LAB — T3, FREE: T3, Free: 3.1 pg/mL (ref 2.3–4.2)

## 2019-02-28 ENCOUNTER — Ambulatory Visit (INDEPENDENT_AMBULATORY_CARE_PROVIDER_SITE_OTHER): Payer: Medicare HMO | Admitting: Endocrinology

## 2019-02-28 ENCOUNTER — Other Ambulatory Visit: Payer: Self-pay

## 2019-02-28 ENCOUNTER — Encounter: Payer: Self-pay | Admitting: Endocrinology

## 2019-02-28 DIAGNOSIS — E0590 Thyrotoxicosis, unspecified without thyrotoxic crisis or storm: Secondary | ICD-10-CM | POA: Diagnosis not present

## 2019-02-28 IMAGING — MG DIGITAL SCREENING BILATERAL MAMMOGRAM WITH TOMO AND CAD
8 series · 8 of 24 positions shown · non-contrast
Comparison: Previous exam(s).

CLINICAL DATA: Screening.

EXAM:
DIGITAL SCREENING BILATERAL MAMMOGRAM WITH TOMO AND CAD

[L CC synth-2D]
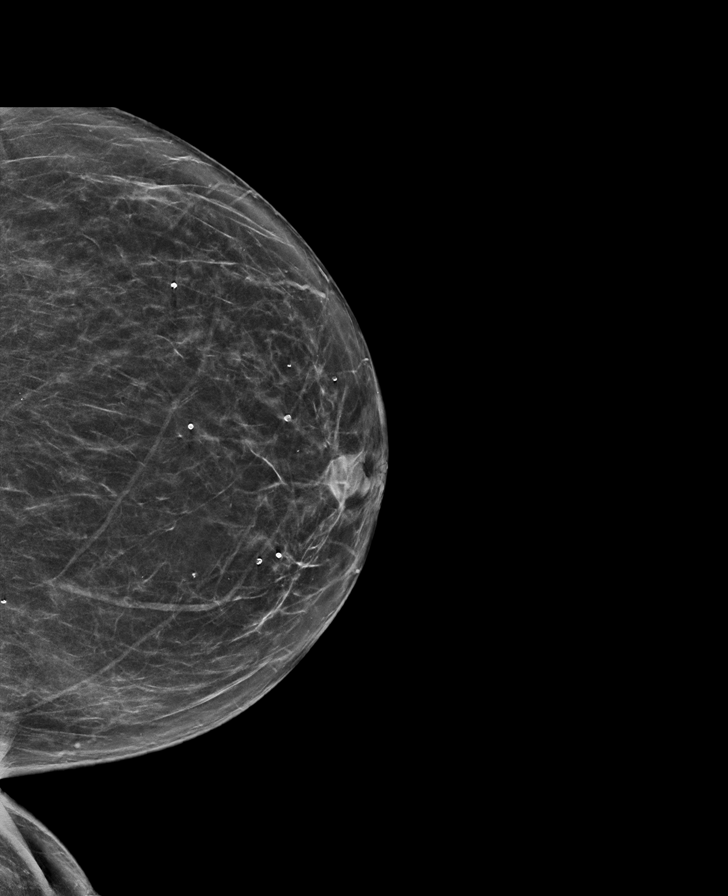

[R MLO synth-2D]
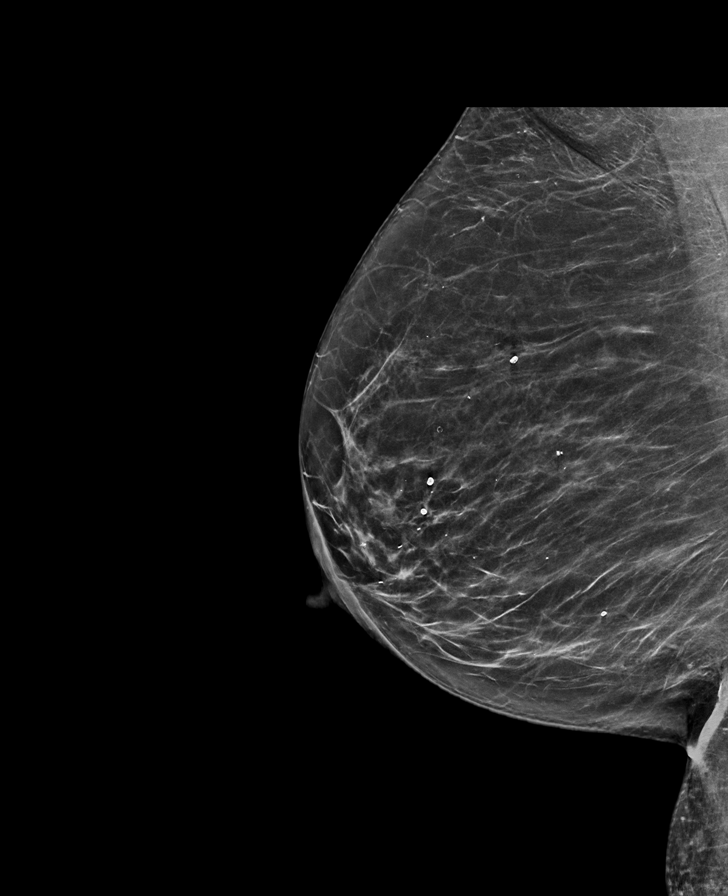

[L MLO synth-2D]
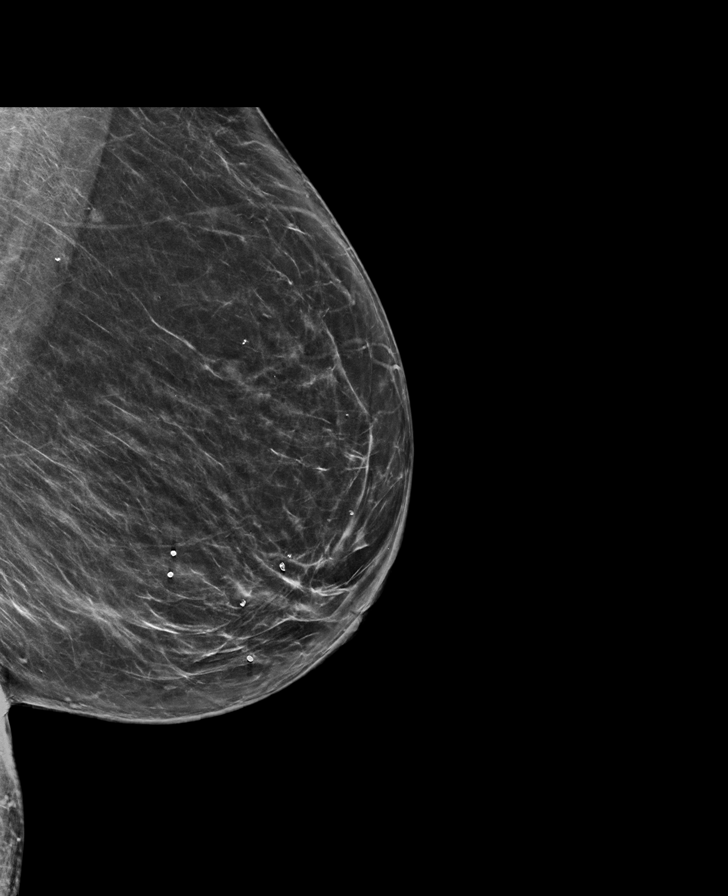

[R CC synth-2D]
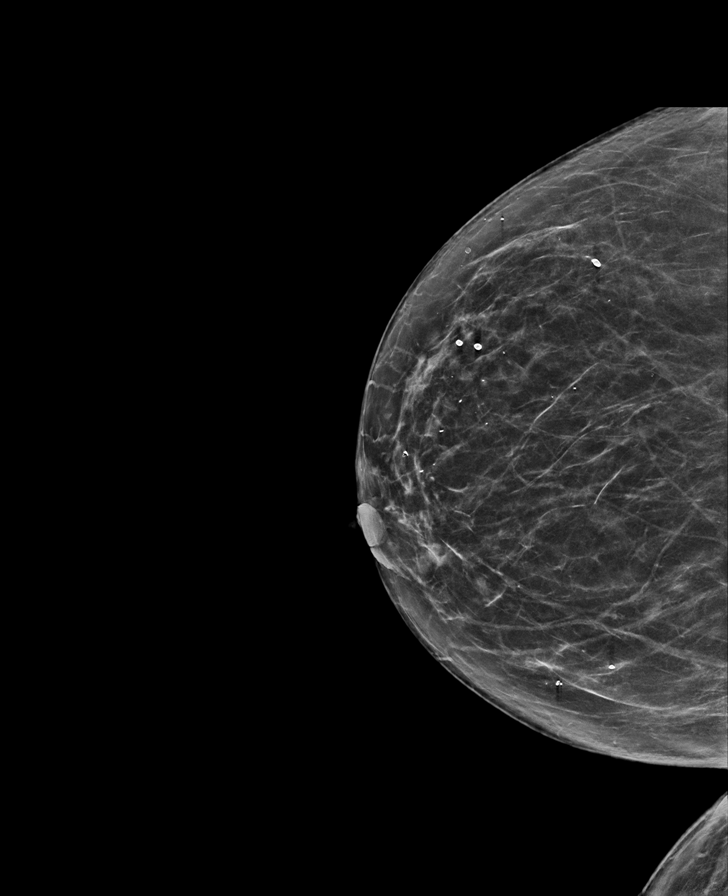

[R CC tomo · tomo slice 37/72.0]
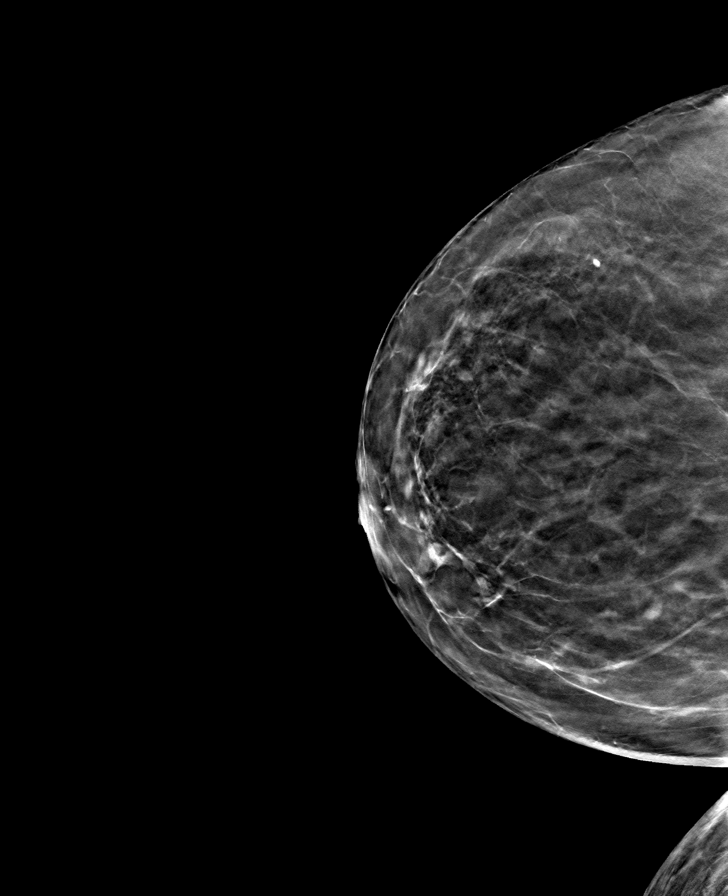

[L MLO tomo · tomo slice 39/76.0]
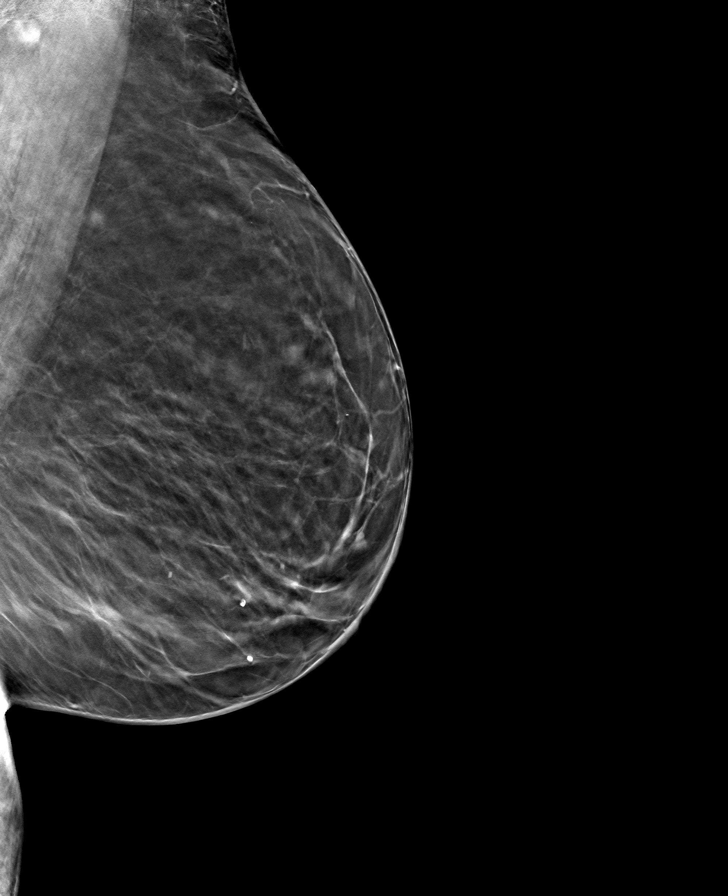

[R MLO tomo · tomo slice 37/72.0]
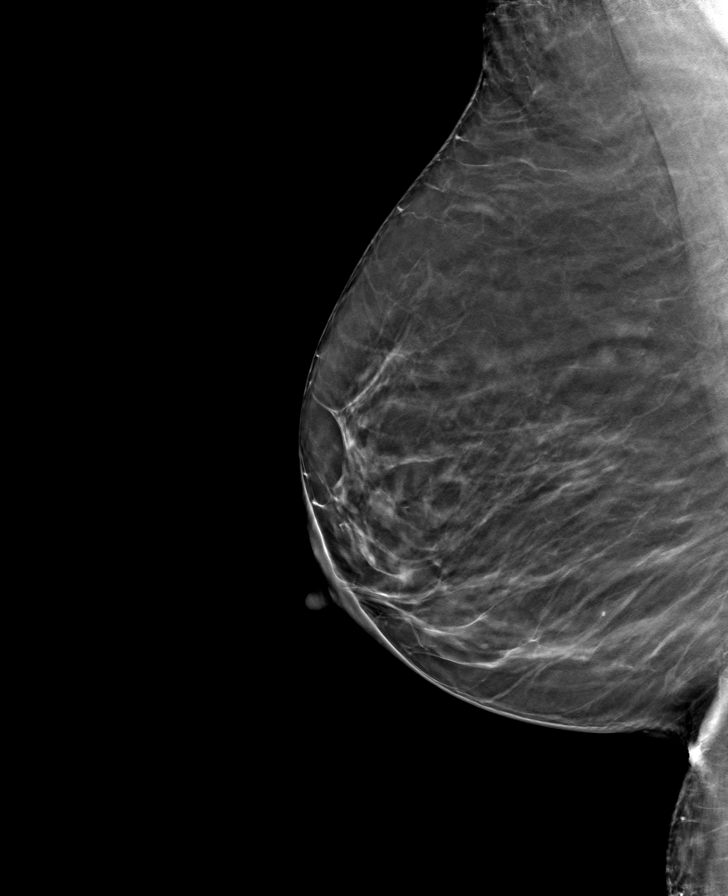

[L CC tomo · tomo slice 34/67.0]
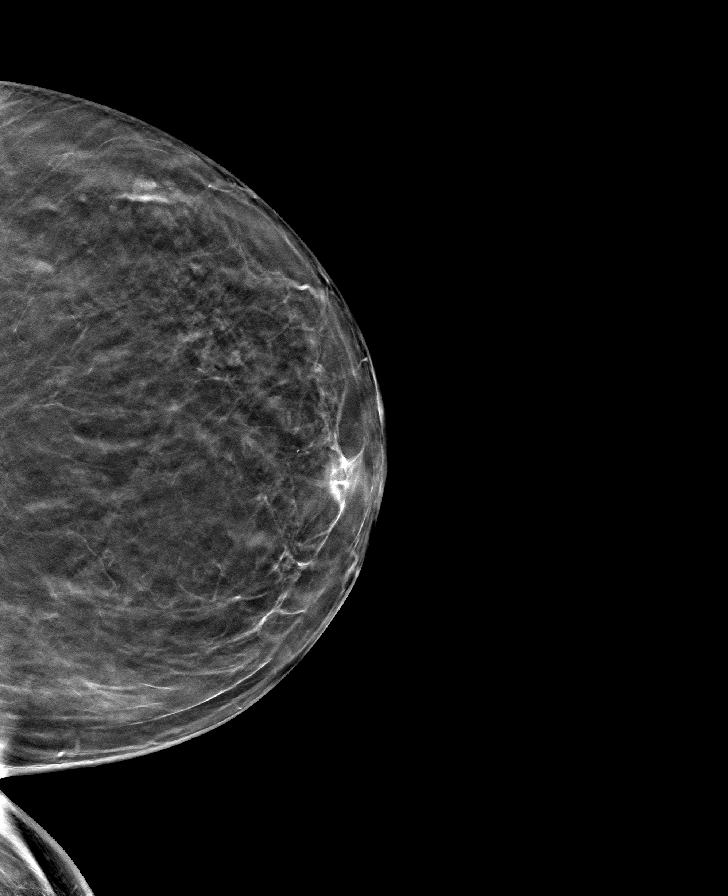

[8 of 24 positions shown; findings below may reference images not displayed]

ACR Breast Density Category b: There are scattered areas of
fibroglandular density.
FINDINGS: There are no findings suspicious for malignancy. Images were
processed with CAD.
IMPRESSION: No mammographic evidence of malignancy. A result letter of this
screening mammogram will be mailed directly to the patient.

RECOMMENDATION:
Screening mammogram in one year. (Code:[id number])

BI-RADS CATEGORY  1: Negative.

## 2019-02-28 NOTE — Progress Notes (Signed)
Patient ID: Maria Hinton, female   DOB: [birth year], 73 y.o.   MRN: [id number]                                                                                                              Reason for Appointment:  Hyperthyroidism, follow-up visit   Referring physician: Derrel Nip  Today's office visit was provided via telemedicine using a telephone call to the patient Patient has been explained the limitations of evaluation and management by telemedicine and the availability of in person appointments.  The patient understood the limitations and agreed to proceed. Patient also understood that the telehealth visit is billable. . Location of the patient: Home . Location of the provider: Office Only the patient and myself were participating in the encounter    History of Present Illness:   Prior history:  For several months before her consultation she had symptoms of weakness, palpitations, shakiness, feeling sweaty in am, nervousness, and fatigue.  Also would feel a strong heartbeat in her ears She had been on atenolol for hypertension also She had a decreased appetite and started losing weight late last summer  The patient had lost about  26 lbs since these symptoms started   RECENT history:  She is taking methimazole since 3/19 for her hyperthyroidism On her 10/2017 visit she was taking 10 mg twice daily because of hyperthyroidism she was given 30 mg daily  In follow-up she has been requiring periodically reduced doses of her methimazole  However since late 2019 she appears to be needing progressively higher doses of methimazole  On her visit in 12/19 she was having fatigue and weakness; did not complain of feeling shaky or having palpitations, no weight loss However her thyroid levels were significantly high with 7.5 mg of methimazole The dose was increased  RECENT history:  In 4/20 she was having problems with feeling sleepy and tired and having less motivation her TSH with  taking 15 mg methimazole was significantly higher at 14.5 and low normal free T4  Subsequently her methimazole has been reduced progressively and in July was taking only 5 mg daily using half of the 10 mg tablet  Subsequently her TSH was still high at 7.2 Her methimazole was stopped on 01/17/2019  More recently she is starting to feel hot and sweaty, also feels a little more tired and weak but no palpitations or significant weight loss  TSH is now suppressed and her free T4 is relatively high at 1.3 compared to 0.9, no change in free T3   Wt Readings from Last 3 Encounters:  02/01/19 156 lb (70.8 kg)  01/17/19 154 lb 6.4 oz (70 kg)  01/04/19 156 lb (70.8 kg)     Thyrotropin receptor antibody mildly increased in March  Thyroid function tests as follows:     Lab Results  Component Value Date   FREET4 1.30 02/22/2019   FREET4 0.92 01/10/2019   FREET4 0.81 11/30/2018   T3FREE 3.1 02/23/2019   T3FREE 3.1 11/30/2018   T3FREE 3.0 10/26/2018  TSH 0.04 (L) 02/22/2019   TSH 7.25 (H) 01/10/2019   TSH 6.85 (H) 11/30/2018    Lab Results  Component Value Date   THYROTRECAB 2.53 (H) 09/07/2018   THYROTRECAB 2.05 (H) 06/21/2018   THYROTRECAB 2.79 (H) 02/28/2018     Allergies as of 02/28/2019      Reactions   Vicodin [hydrocodone-acetaminophen] Other (See Comments)   Insomnia       Medication List       Accurate as of February 28, 2019  1:08 PM. If you have any questions, ask your nurse or doctor.        albuterol 108 (90 Base) MCG/ACT inhaler Commonly known as: VENTOLIN HFA Inhale 2 puffs into the lungs every 6 (six) hours as needed for wheezing.   ALPRAZolam 0.25 MG tablet Commonly known as: XANAX TAKE 1 TABLET BY MOUTH TWICE A DAY AS NEEDED   aspirin EC 81 MG tablet Take 81 mg by mouth daily.   atenolol 50 MG tablet Commonly known as: TENORMIN TAKE 1 TABLET (50 MG TOTAL) BY MOUTH 2 (TWO) TIMES DAILY.   atorvastatin 40 MG tablet Commonly known as: LIPITOR TAKE 1  TABLET BY MOUTH DAILY AT 6 PM.   glucose blood test strip Commonly known as: OneTouch Verio Use as instructed   hydrochlorothiazide 12.5 MG tablet Commonly known as: HYDRODIURIL TAKE 1 TABLET (12.5 MG TOTAL) BY MOUTH DAILY.   Insulin Pen Needle 32G X 4 MM Misc Commonly known as: BD Pen Needle Nano U/F USE DAILY AS DIRECTED with levemir   Levemir FlexTouch 100 UNIT/ML Pen Generic drug: Insulin Detemir INJECT 15 UNITS INTO THE SKIN DAILY AT 10 PM.   losartan 50 MG tablet Commonly known as: COZAAR Take 1 tablet (50 mg total) by mouth daily.   metFORMIN 1000 MG tablet Commonly known as: GLUCOPHAGE TAKE 1 TABLET BY MOUTH 2 TIMES DAILY WITH A MEAL.   methimazole 10 MG tablet Commonly known as: TAPAZOLE TAKE 1 TABLET BY MOUTH TWICE A DAY   omeprazole 40 MG capsule Commonly known as: PRILOSEC TAKE 1 CAPSULE BY MOUTH EVERY DAY   onetouch ultrasoft lancets Use as instructed   traMADol 50 MG tablet Commonly known as: ULTRAM TAKE 1 TABLET BY MOUTH EVERY 6 HOURS AS NEEDED FOR SPINAL STENOSIS PAIN           Past Medical History:  Diagnosis Date  . Actinic keratoses    follows with dermatology   . Arthritis   . Asthma   . Chronic back pain    DDD,herniated disc/spondylosis/radiculopathy  . COPD (chronic obstructive pulmonary disease) ([place])   . Diabetes mellitus    1980s  . Early cataracts, bilateral    Dr. Ruthine Dose - [facility]  . GERD (gastroesophageal reflux disease)    takes Omeprazole daily  . Hemorrhoids   . History of colonic polyps    colonoscopy 04/2012 - Dr Candace Cruise  . Hyperlipidemia    takes Pravastatin daily  . Hypertension    takes Metoprolol/HCTZ daily  . Hyperthyroidism   . Insomnia   . Joint pain    fingers  . Joint swelling   . Pneumonia    x 2 ;in Dec 2010/2011  . PONV (postoperative nausea and vomiting)   . Right leg weakness     Past Surgical History:  Procedure Laterality Date  . ABDOMINAL HYSTERECTOMY     at age 49  . BACK  SURGERY  20+yrs ago  . CATARACT EXTRACTION W/PHACO Left 01/04/2019  Procedure: CATARACT EXTRACTION PHACO AND INTRAOCULAR LENS PLACEMENT ([place])  LEFT;  Surgeon: Leandrew Koyanagi, MD;  Location: [place];  Service: Ophthalmology;  Laterality: Left;  GIVE IV ZOFRAN  . CATARACT EXTRACTION W/PHACO Right 02/01/2019   Procedure: CATARACT EXTRACTION PHACO AND INTRAOCULAR LENS PLACEMENT ([place])  RIGHT DIABETIC;  Surgeon: Leandrew Koyanagi, MD;  Location: [place];  Service: Ophthalmology;  Laterality: Right;  DIABETIC GIVE IV ZOFRAN  . COLONOSCOPY    . COLONOSCOPY N/A 05/13/2015   Procedure: COLONOSCOPY;  Surgeon: Hulen Luster, MD;  Location: [facility] ENDOSCOPY;  Service: Gastroenterology;  Laterality: N/A;  . ESOPHAGOGASTRODUODENOSCOPY  2011  . ESOPHAGOGASTRODUODENOSCOPY (EGD) WITH PROPOFOL N/A 09/02/2017   Procedure: ESOPHAGOGASTRODUODENOSCOPY (EGD) WITH PROPOFOL;  Surgeon: Virgel Manifold, MD;  Location: [facility] ENDOSCOPY;  Service: Endoscopy;  Laterality: N/A;  . FOOT SURGERY  2011   bunionectomy and knot removed from bottom of both feet  . JOINT REPLACEMENT Left 2014   hip  Kraskinski  . LUMBAR LAMINECTOMY/DECOMPRESSION MICRODISCECTOMY  09/10/2011   Procedure: LUMBAR LAMINECTOMY/DECOMPRESSION MICRODISCECTOMY 1 LEVEL;  Surgeon: Hosie Spangle, MD;  Location: [place] NEURO ORS;  Service: Neurosurgery;  Laterality: Right;  RIGHT Lumbar  Laminotomy and microdiskectomy Lumbar Four-Five  . LUMBAR LAMINECTOMY/DECOMPRESSION MICRODISCECTOMY N/A 06/26/2015   Procedure: LUMBAR LAMINECTOMY/DECOMPRESSION MICRODISCECTOMY 1 LEVEL;  Surgeon: Jovita Gamma, MD;  Location: [place] NEURO ORS;  Service: Neurosurgery;  Laterality: N/A;  L3 and L4 Laminectomies  . MELANOMA EXCISION  2011   on nose/back on neck  . THORACIC DISC SURGERY  at age 73 and 34    mass of veins that had gotten into muscle and wrapped around rib;3 ribs also removed from left side  . TONSILLECTOMY  as a child   and adenoids   . TOTAL  HIP ARTHROPLASTY Left 2014   Dr. Mack Guise  . TUBAL LIGATION    . VAGINA SURGERY     vaginal wall ruptured     Family History  Problem Relation Age of Onset  . Heart disease Brother        valve replacement  . COPD Brother   . COPD Mother        Emphysema  . Diabetes Mother   . Other Mother        Died in house fire at age 77.  Marland Kitchen Heart disease Father   . Seizures Father   . Cancer Father        Lung cancer with brain metastasis  . Asthma Brother   . COPD Son 59  . Cancer Other        aunt ? maternal vs paternal side lung cancer   . Diabetes Maternal Grandmother   . Anesthesia problems Neg Hx   . Hypotension Neg Hx   . Malignant hyperthermia Neg Hx   . Pseudochol deficiency Neg Hx   . Thyroid disease Neg Hx   . Breast cancer Neg Hx     Social History:  reports that she quit smoking about 2 years ago. Her smoking use included cigarettes. She has a 5.00 pack-year smoking history. She has never used smokeless tobacco. She reports that she does not drink alcohol or use drugs.  Allergies:  Allergies  Allergen Reactions  . Vicodin [Hydrocodone-Acetaminophen] Other (See Comments)    Insomnia      Review of Systems  She is on insulin for diabetes and followed by PCP A1c as follows, due to follow-up with PCP in October   Lab Results  Component Value Date  HGBA1C 6.9 (H) 10/26/2018   HGBA1C 6.4 (A) 04/11/2018   HGBA1C 6.4 (A) 02/25/2018   Lab Results  Component Value Date   MICROALBUR <0.7 10/26/2018   LDLCALC 65 10/26/2018   CREATININE 0.80 10/26/2018      Examination:   There were no vitals taken for this visit.     Assessment/Plan:   Hyperthyroidism, diagnosed to be from Graves' disease, on antithyroid drug treatment since 09/2017 No previous history of goiter  Although recently she has required progressively lower doses of methimazole and was still hypothyroid with 5 mg dosage now is having a recurrence of her hyperthyroidism but stopping this  treatment She is mildly symptomatic after stopping the medication about 6 weeks ago  Her TSH is suppressed and free T4 is higher than before but still in the normal range This indicates mild recurrence  For now we will have her take 5 mg every other day of her methimazole but will start with 5 mg daily for the rest of the week Will reassess along with thyrotropin receptor antibody in about 6 weeks  Discussed possibility of doing I-131 treatment if she does not achieve remission soon  No telephone encounter =6 minutes  Elayne Snare 02/28/2019, 1:08 PM    Note: This office note was prepared with Dragon voice recognition system technology. Any transcriptional errors that result from this process are unintentional.

## 2019-03-08 ENCOUNTER — Other Ambulatory Visit: Payer: Self-pay | Admitting: Internal Medicine

## 2019-03-08 NOTE — Telephone Encounter (Signed)
Refilled: 02/08/2019 Last OV: 01/10/2019 Next OV: 04/17/2019

## 2019-03-21 ENCOUNTER — Other Ambulatory Visit: Payer: Self-pay | Admitting: Internal Medicine

## 2019-03-29 ENCOUNTER — Other Ambulatory Visit: Payer: Self-pay | Admitting: Internal Medicine

## 2019-03-30 ENCOUNTER — Other Ambulatory Visit (INDEPENDENT_AMBULATORY_CARE_PROVIDER_SITE_OTHER): Payer: Medicare HMO

## 2019-03-30 ENCOUNTER — Other Ambulatory Visit: Payer: Self-pay

## 2019-03-30 DIAGNOSIS — E0590 Thyrotoxicosis, unspecified without thyrotoxic crisis or storm: Secondary | ICD-10-CM | POA: Diagnosis not present

## 2019-03-30 LAB — TSH: TSH: 0.01 u[IU]/mL — ABNORMAL LOW (ref 0.35–4.50)

## 2019-03-30 LAB — T3, FREE: T3, Free: 3.5 pg/mL (ref 2.3–4.2)

## 2019-03-30 LAB — T4, FREE: Free T4: 1.33 ng/dL (ref 0.60–1.60)

## 2019-03-31 LAB — THYROTROPIN RECEPTOR AUTOABS: Thyrotropin Receptor Ab: 1.79 IU/L — ABNORMAL HIGH (ref 0.00–1.75)

## 2019-04-04 ENCOUNTER — Ambulatory Visit: Payer: Medicare HMO | Admitting: Endocrinology

## 2019-04-04 DIAGNOSIS — H029 Unspecified disorder of eyelid: Secondary | ICD-10-CM | POA: Diagnosis not present

## 2019-04-04 DIAGNOSIS — D2339 Other benign neoplasm of skin of other parts of face: Secondary | ICD-10-CM | POA: Diagnosis not present

## 2019-04-04 DIAGNOSIS — L821 Other seborrheic keratosis: Secondary | ICD-10-CM | POA: Diagnosis not present

## 2019-04-05 ENCOUNTER — Ambulatory Visit (INDEPENDENT_AMBULATORY_CARE_PROVIDER_SITE_OTHER): Payer: Medicare HMO | Admitting: Endocrinology

## 2019-04-05 ENCOUNTER — Other Ambulatory Visit: Payer: Self-pay

## 2019-04-05 ENCOUNTER — Encounter: Payer: Self-pay | Admitting: Endocrinology

## 2019-04-05 VITALS — Ht 64.0 in | Wt 150.0 lb

## 2019-04-05 DIAGNOSIS — E0590 Thyrotoxicosis, unspecified without thyrotoxic crisis or storm: Secondary | ICD-10-CM | POA: Diagnosis not present

## 2019-04-05 NOTE — Progress Notes (Signed)
Patient ID: Maria Hinton, female   DOB: 06-30-1946, 73 y.o.   MRN: [id number]                                                                                                              Reason for Appointment:  Hyperthyroidism, follow-up visit   Referring physician: Derrel Nip  Today's office visit was provided via telemedicine using a telephone call to the patient Patient has been explained the limitations of evaluation and management by telemedicine and the availability of in person appointments.  The patient understood the limitations and agreed to proceed. Patient also understood that the telehealth visit is billable. . Location of the patient: Home . Location of the provider: Office Only the patient and myself were participating in the encounter    History of Present Illness:   Prior history:  For several months before her consultation she had symptoms of weakness, palpitations, shakiness, feeling sweaty in am, nervousness, and fatigue.  Also would feel a strong heartbeat in her ears She had been on atenolol for hypertension also She had a decreased appetite and started losing weight late last summer  The patient had lost about  26 lbs since these symptoms started   RECENT history:  She started taking methimazole in 3/19 for her hyperthyroidism On her 10/2017 visit she was taking 10 mg twice daily because of hyperthyroidism she was given 30 mg daily  In follow-up she has been requiring periodically reduced doses of her methimazole  However since late 2019 she appears to be needing progressively higher doses of methimazole  On her visit in 12/19 she was having fatigue and weakness; did not complain of feeling shaky or having palpitations, no weight loss However her thyroid levels were significantly high with 7.5 mg of methimazole The dose was increased  RECENT history:  In 4/20 she was having problems with feeling sleepy and tired and having less motivation her TSH with  taking 15 mg methimazole was significantly higher at 14.5 and low normal free T4  Subsequently her methimazole has been reduced progressively and in July was taking only 5 mg daily using half of the 10 mg tablet  Subsequently her TSH was still high at 7.2  Her methimazole was stopped on 01/17/2019  However on her last visit on 02/28/2019 she was having symptoms of feeling hot and sweaty, also a little more tired and weak but no palpitations or significant weight loss  TSH is was then suppressed and her free T4 is relatively high at 1.3 compared to 0.9, no change in free T3 She is now taking methimazole 5 mg every other day using her 10 mg tablets  She feels a little better with her energy but still a little weak.  She feels like she has to take nap in the afternoon which is not new She has no insomnia but takes Xanax to sleep Her weight at home is a little lower than in July   Wt Readings from Last 3 Encounters:  04/05/19 150 lb (68 kg)  02/01/19 156 lb (70.8 kg)  01/17/19 154 lb 6.4 oz (70 kg)     Thyrotropin receptor antibody mildly increased but better than in March  Thyroid function tests as follows:     Lab Results  Component Value Date   FREET4 1.33 03/30/2019   FREET4 1.30 02/22/2019   FREET4 0.92 01/10/2019   T3FREE 3.5 03/30/2019   T3FREE 3.1 02/23/2019   T3FREE 3.1 11/30/2018   TSH 0.01 (L) 03/30/2019   TSH 0.04 (L) 02/22/2019   TSH 7.25 (H) 01/10/2019    Lab Results  Component Value Date   THYROTRECAB 1.79 (H) 03/30/2019   THYROTRECAB 2.53 (H) 09/07/2018   THYROTRECAB 2.05 (H) 06/21/2018     Allergies as of 04/05/2019      Reactions   Vicodin [hydrocodone-acetaminophen] Other (See Comments)   Insomnia       Medication List       Accurate as of April 05, 2019 10:38 AM. If you have any questions, ask your nurse or doctor.        albuterol 108 (90 Base) MCG/ACT inhaler Commonly known as: VENTOLIN HFA Inhale 2 puffs into the lungs every 6 (six)  hours as needed for wheezing.   ALPRAZolam 0.25 MG tablet Commonly known as: XANAX TAKE 1 TABLET BY MOUTH TWICE A DAY AS NEEDED   aspirin EC 81 MG tablet Take 81 mg by mouth daily.   atenolol 50 MG tablet Commonly known as: TENORMIN TAKE 1 TABLET BY MOUTH TWICE A DAY   atorvastatin 40 MG tablet Commonly known as: LIPITOR TAKE 1 TABLET BY MOUTH DAILY AT 6 PM.   BD Pen Needle Nano U/F 32G X 4 MM Misc Generic drug: Insulin Pen Needle USE DAILY AS DIRECTED WITH LEVEMIR   glucose blood test strip Commonly known as: OneTouch Verio Use as instructed   hydrochlorothiazide 12.5 MG tablet Commonly known as: HYDRODIURIL TAKE 1 TABLET (12.5 MG TOTAL) BY MOUTH DAILY.   Levemir FlexTouch 100 UNIT/ML Pen Generic drug: Insulin Detemir INJECT 15 UNITS INTO THE SKIN DAILY AT 10 PM.   losartan 50 MG tablet Commonly known as: COZAAR Take 1 tablet (50 mg total) by mouth daily.   metFORMIN 1000 MG tablet Commonly known as: GLUCOPHAGE TAKE 1 TABLET BY MOUTH 2 TIMES DAILY WITH A MEAL.   methimazole 10 MG tablet Commonly known as: TAPAZOLE TAKE 1 TABLET BY MOUTH TWICE A DAY What changed:   how much to take  when to take this  additional instructions   omeprazole 40 MG capsule Commonly known as: PRILOSEC TAKE 1 CAPSULE BY MOUTH EVERY DAY   onetouch ultrasoft lancets Use as instructed   traMADol 50 MG tablet Commonly known as: ULTRAM TAKE 1 TABLET BY MOUTH EVERY 6 HOURS AS NEEDED FOR SPINAL STENOSIS PAIN           Past Medical History:  Diagnosis Date  . Actinic keratoses    follows with dermatology   . Arthritis   . Asthma   . Chronic back pain    DDD,herniated disc/spondylosis/radiculopathy  . COPD (chronic obstructive pulmonary disease) ([place])   . Diabetes mellitus    1980s  . Early cataracts, bilateral    Dr. Ruthine Dose - [facility]  . GERD (gastroesophageal reflux disease)    takes Omeprazole daily  . Hemorrhoids   . History of colonic polyps     colonoscopy 04/2012 - Dr Candace Cruise  . Hyperlipidemia    takes Pravastatin daily  . Hypertension    takes  Metoprolol/HCTZ daily  . Hyperthyroidism   . Insomnia   . Joint pain    fingers  . Joint swelling   . Pneumonia    x 2 ;in Dec 2010/2011  . PONV (postoperative nausea and vomiting)   . Right leg weakness     Past Surgical History:  Procedure Laterality Date  . ABDOMINAL HYSTERECTOMY     at age 89  . BACK SURGERY  20+yrs ago  . CATARACT EXTRACTION W/PHACO Left 01/04/2019   Procedure: CATARACT EXTRACTION PHACO AND INTRAOCULAR LENS PLACEMENT ([place])  LEFT;  Surgeon: Leandrew Koyanagi, MD;  Location: [place];  Service: Ophthalmology;  Laterality: Left;  GIVE IV ZOFRAN  . CATARACT EXTRACTION W/PHACO Right 02/01/2019   Procedure: CATARACT EXTRACTION PHACO AND INTRAOCULAR LENS PLACEMENT ([place])  RIGHT DIABETIC;  Surgeon: Leandrew Koyanagi, MD;  Location: [facility];  Service: Ophthalmology;  Laterality: Right;  DIABETIC GIVE IV ZOFRAN  . COLONOSCOPY    . COLONOSCOPY N/A 05/13/2015   Procedure: COLONOSCOPY;  Surgeon: Hulen Luster, MD;  Location: [facility] ENDOSCOPY;  Service: Gastroenterology;  Laterality: N/A;  . ESOPHAGOGASTRODUODENOSCOPY  2011  . ESOPHAGOGASTRODUODENOSCOPY (EGD) WITH PROPOFOL N/A 09/02/2017   Procedure: ESOPHAGOGASTRODUODENOSCOPY (EGD) WITH PROPOFOL;  Surgeon: Virgel Manifold, MD;  Location: [facility] ENDOSCOPY;  Service: Endoscopy;  Laterality: N/A;  . FOOT SURGERY  2011   bunionectomy and knot removed from bottom of both feet  . JOINT REPLACEMENT Left 2014   hip  Kraskinski  . LUMBAR LAMINECTOMY/DECOMPRESSION MICRODISCECTOMY  09/10/2011   Procedure: LUMBAR LAMINECTOMY/DECOMPRESSION MICRODISCECTOMY 1 LEVEL;  Surgeon: Hosie Spangle, MD;  Location: [facility];  Service: Neurosurgery;  Laterality: Right;  RIGHT Lumbar  Laminotomy and microdiskectomy Lumbar Four-Five  . LUMBAR LAMINECTOMY/DECOMPRESSION MICRODISCECTOMY N/A 06/26/2015   Procedure: LUMBAR  LAMINECTOMY/DECOMPRESSION MICRODISCECTOMY 1 LEVEL;  Surgeon: Jovita Gamma, MD;  Location: [facility];  Service: Neurosurgery;  Laterality: N/A;  L3 and L4 Laminectomies  . MELANOMA EXCISION  2011   on nose/back on neck  . THORACIC DISC SURGERY  at age 65 and 3    mass of veins that had gotten into muscle and wrapped around rib;3 ribs also removed from left side  . TONSILLECTOMY  as a child   and adenoids   . TOTAL HIP ARTHROPLASTY Left 2014   Dr. Mack Guise  . TUBAL LIGATION    . VAGINA SURGERY     vaginal wall ruptured     Family History  Problem Relation Age of Onset  . Heart disease Brother        valve replacement  . COPD Brother   . COPD Mother        Emphysema  . Diabetes Mother   . Other Mother        Died in house fire at age 18.  Marland Kitchen Heart disease Father   . Seizures Father   . Cancer Father        Lung cancer with brain metastasis  . Asthma Brother   . COPD Son 48  . Cancer Other        aunt ? maternal vs paternal side lung cancer   . Diabetes Maternal Grandmother   . Anesthesia problems Neg Hx   . Hypotension Neg Hx   . Malignant hyperthermia Neg Hx   . Pseudochol deficiency Neg Hx   . Thyroid disease Neg Hx   . Breast cancer Neg Hx     Social History:  reports that she quit smoking about 2 years ago. Her  smoking use included cigarettes. She has a 5.00 pack-year smoking history. She has never used smokeless tobacco. She reports that she does not drink alcohol or use drugs.  Allergies:  Allergies  Allergen Reactions  . Vicodin [Hydrocodone-Acetaminophen] Other (See Comments)    Insomnia      Review of Systems  She is on insulin for diabetes and followed by PCP A1c as follows, due to follow-up with PCP in October   Lab Results  Component Value Date   HGBA1C 6.9 (H) 10/26/2018   HGBA1C 6.4 (A) 04/11/2018   HGBA1C 6.4 (A) 02/25/2018   Lab Results  Component Value Date   MICROALBUR <0.7 10/26/2018   LDLCALC 65 10/26/2018   CREATININE 0.80  10/26/2018      Examination:   Ht 5\' 4"  (1.626 m)   Wt 150 lb (68 kg)   BMI 25.75 kg/m   No exam done, patient is remote   Assessment/Plan:   Hyperthyroidism, diagnosed to be from Graves' disease, on antithyroid drug treatment since 09/2017 No previous history of goiter  She is back on methimazole which she has been tolerating without side effects Previously had started getting hypothyroid even with lower doses of methimazole Currently with 2.5 mg equivalent daily dose her symptoms are not back to normal and her free T4 is about the same as last month  She will need to go back to 5 mg methimazole daily Anticipate that she will be back on methimazole for 3 to 6 months now Follow-up in 6 weeks  Will defer I-131 treatment since her thyrotropin receptor antibody is almost normal  No telephone encounter =6 minutes  Elayne Snare 04/05/2019, 10:38 AM    Note: This office note was prepared with Dragon voice recognition system technology. Any transcriptional errors that result from this process are unintentional.

## 2019-04-07 ENCOUNTER — Other Ambulatory Visit: Payer: Self-pay | Admitting: Endocrinology

## 2019-04-10 ENCOUNTER — Other Ambulatory Visit: Payer: Self-pay | Admitting: Internal Medicine

## 2019-04-11 NOTE — Telephone Encounter (Signed)
Refilled: 03/08/2019 Last OV: 01/10/2019 Next OV: 04/17/2019

## 2019-04-13 ENCOUNTER — Other Ambulatory Visit: Payer: Self-pay

## 2019-04-14 ENCOUNTER — Other Ambulatory Visit (INDEPENDENT_AMBULATORY_CARE_PROVIDER_SITE_OTHER): Payer: Medicare HMO

## 2019-04-14 ENCOUNTER — Other Ambulatory Visit: Payer: Self-pay

## 2019-04-14 DIAGNOSIS — Z794 Long term (current) use of insulin: Secondary | ICD-10-CM

## 2019-04-14 DIAGNOSIS — E11649 Type 2 diabetes mellitus with hypoglycemia without coma: Secondary | ICD-10-CM

## 2019-04-14 LAB — COMPREHENSIVE METABOLIC PANEL
ALT: 12 U/L (ref 0–35)
AST: 13 U/L (ref 0–37)
Albumin: 4 g/dL (ref 3.5–5.2)
Alkaline Phosphatase: 116 U/L (ref 39–117)
BUN: 12 mg/dL (ref 6–23)
CO2: 31 mEq/L (ref 19–32)
Calcium: 8.6 mg/dL (ref 8.4–10.5)
Chloride: 101 mEq/L (ref 96–112)
Creatinine, Ser: 0.87 mg/dL (ref 0.40–1.20)
GFR: 64.15 mL/min (ref >60.00)
Glucose, Bld: 139 mg/dL — ABNORMAL HIGH (ref 70–99)
Potassium: 4.4 mEq/L (ref 3.5–5.1)
Sodium: 140 mEq/L (ref 135–145)
Total Bilirubin: 0.4 mg/dL (ref 0.2–1.2)
Total Protein: 6.3 g/dL (ref 6.0–8.3)

## 2019-04-14 LAB — LIPID PANEL
Cholesterol: 114 mg/dL (ref 0–200)
HDL: 42.9 mg/dL (ref >39.00)
LDL Cholesterol: 45 mg/dL (ref 0–99)
NonHDL: 71.08
Total CHOL/HDL Ratio: 3
Triglycerides: 132 mg/dL (ref 0.0–149.0)
VLDL: 26.4 mg/dL (ref 0.0–40.0)

## 2019-04-14 LAB — MICROALBUMIN / CREATININE URINE RATIO
Creatinine,U: 111.2 mg/dL
Microalb Creat Ratio: 0.7 mg/g (ref 0.0–30.0)
Microalb, Ur: 0.7 mg/dL (ref 0.0–1.9)

## 2019-04-14 LAB — HEMOGLOBIN A1C: Hgb A1c MFr Bld: 7.4 % — ABNORMAL HIGH (ref 4.6–6.5)

## 2019-04-17 ENCOUNTER — Other Ambulatory Visit: Payer: Self-pay

## 2019-04-17 ENCOUNTER — Other Ambulatory Visit: Payer: Self-pay | Admitting: Internal Medicine

## 2019-04-17 ENCOUNTER — Encounter: Payer: Self-pay | Admitting: Internal Medicine

## 2019-04-17 ENCOUNTER — Ambulatory Visit: Payer: Medicare HMO | Admitting: Internal Medicine

## 2019-04-17 VITALS — BP 130/68 | HR 63 | Temp 97.6°F | Resp 14 | Ht 64.0 in | Wt 154.4 lb

## 2019-04-17 DIAGNOSIS — Z794 Long term (current) use of insulin: Secondary | ICD-10-CM

## 2019-04-17 DIAGNOSIS — E1140 Type 2 diabetes mellitus with diabetic neuropathy, unspecified: Secondary | ICD-10-CM | POA: Insufficient documentation

## 2019-04-17 DIAGNOSIS — E11649 Type 2 diabetes mellitus with hypoglycemia without coma: Secondary | ICD-10-CM

## 2019-04-17 DIAGNOSIS — I10 Essential (primary) hypertension: Secondary | ICD-10-CM

## 2019-04-17 DIAGNOSIS — E1142 Type 2 diabetes mellitus with diabetic polyneuropathy: Secondary | ICD-10-CM | POA: Diagnosis not present

## 2019-04-17 DIAGNOSIS — R69 Illness, unspecified: Secondary | ICD-10-CM | POA: Diagnosis not present

## 2019-04-17 DIAGNOSIS — M205X1 Other deformities of toe(s) (acquired), right foot: Secondary | ICD-10-CM

## 2019-04-17 NOTE — Assessment & Plan Note (Signed)
Well controlled on current regimen. Renal function stable, no changes today.  Lab Results  Component Value Date   CREATININE 0.87 04/14/2019   Lab Results  Component Value Date   NA 140 04/14/2019   K 4.4 04/14/2019   CL 101 04/14/2019   CO2 31 04/14/2019

## 2019-04-17 NOTE — Patient Instructions (Addendum)
Stop checking fasting sugars  Check sugars 2 hours after eating once daily  especially after a breakfast of cereal    Certain fruits are higher in sugar than others.  Checking your blood sugar 2 hours after you eat is a good way to dig out how often you can have certain ones.  You goal is BS < 160 2 hrs after eating)   Podiatre referral is underway

## 2019-04-17 NOTE — Assessment & Plan Note (Signed)
With subtle changes suggesting claw foot on right.  Podiatry referral in progress

## 2019-04-17 NOTE — Assessment & Plan Note (Signed)
Slight loss of control noted.  Attributed to lack of exercise since her dog died in early March 23, 2023 and food choices .  Diet discussed in detail.  Asked to submit post prandial blood sugars in a few weeks before medication adjustments will be made  Lab Results  Component Value Date   HGBA1C 7.4 (H) 04/14/2019   Lab Results  Component Value Date   MICROALBUR 0.7 04/14/2019

## 2019-04-17 NOTE — Progress Notes (Signed)
Subjective:  Patient ID: Maria Hinton, female    DOB: 04-17-1946  Age: 73 y.o. MRN: [id number]  CC: The primary encounter diagnosis was Type 2 diabetes mellitus with hypoglycemia without coma, with long-term current use of insulin ([place]). Diagnoses of Claw toe, acquired, right, Diabetic polyneuropathy associated with type 2 diabetes mellitus ([place]), and HTN (hypertension), benign were also pertinent to this visit.  HPI [name] presents for 3 month follow up on type 2 DM,  Hypertension,     3 month follow up on diabetes.  Patient has no complaints today.  Patient is following a low glycemic index diet and taking all prescribed medications regularly without side effects.  Fasting sugars have been  less than 140 most of the time and post prandials have been under 160 except on rare occasions. Patient is not exercising since she had to euthanize  Her beloved dog last month.  .  Patient has had an eye exam in the last 12 months and checks feet regularly for signs of infection.  Patient does not walk barefoot outside,  And has numbness, sharp pains and tingling \ in feet. Patient is up to date on all recommended vaccinations  History  of bunionectomy bilaterally.  Has notes that toes are starting to curl downward / under on right foot  And left great toe starting to deviate upward, Cereal,  canteloupe for breakfast. At 11 am .  Lots of canteloupe ;  COVID TEST NEGATIVE X 2 done preoperatively for [facility] CATARACT   surgery done by Dr.  Mordecai Rasmussen . Vision has greatly improved.  WEARING ONLY READING GLASSES NOW   Had biopsy of a skin tag in inner corner of right eye  Hypertension: patient checks blood pressure twice weekly at home.  Readings have been for the most part < 140/80 at rest . Patient is following a reduce salt diet most days and is taking medications as prescribed   Sees  Ruffin Frederick for hyperthyroid ; takes 5 mg daily of methimazole. He plans to have her get  XRT of thyroid   Sees  Nudelman on Nov 13 surgery was 8 years ago  Lumbar spinal stenosis managed with 2 tramadol daily  sometimes 3  Outpatient Medications Prior to Visit  Medication Sig Dispense Refill  . albuterol (PROVENTIL HFA;VENTOLIN HFA) 108 (90 Base) MCG/ACT inhaler Inhale 2 puffs into the lungs every 6 (six) hours as needed for wheezing. 6.7 g 11  . ALPRAZolam (XANAX) 0.25 MG tablet TAKE 1 TABLET BY MOUTH TWICE A DAY AS NEEDED 60 tablet 2  . aspirin EC 81 MG tablet Take 81 mg by mouth daily.    Marland Kitchen atenolol (TENORMIN) 50 MG tablet TAKE 1 TABLET BY MOUTH TWICE A DAY 180 tablet 1  . atorvastatin (LIPITOR) 40 MG tablet TAKE 1 TABLET BY MOUTH DAILY AT 6 PM. 90 tablet 3  . hydrochlorothiazide (HYDRODIURIL) 12.5 MG tablet TAKE 1 TABLET (12.5 MG TOTAL) BY MOUTH DAILY. 90 tablet 3  . Insulin Pen Needle (BD PEN NEEDLE NANO U/F) 32G X 4 MM MISC USE DAILY AS DIRECTED WITH LEVEMIR 100 each 1  . LEVEMIR FLEXTOUCH 100 UNIT/ML Pen INJECT 15 UNITS INTO THE SKIN DAILY AT 10 PM. 15 mL 2  . losartan (COZAAR) 50 MG tablet Take 1 tablet (50 mg total) by mouth daily. 90 tablet 3  . metFORMIN (GLUCOPHAGE) 1000 MG tablet TAKE 1 TABLET BY MOUTH 2 TIMES DAILY WITH A MEAL. 180 tablet 1  . methimazole (TAPAZOLE)  10 MG tablet TAKE 1 TABLET BY MOUTH TWICE A DAY 180 tablet 1  . omeprazole (PRILOSEC) 40 MG capsule TAKE 1 CAPSULE BY MOUTH EVERY DAY 90 capsule 3  . ONETOUCH VERIO test strip USE AS INSTRUCTED 100 strip 12  . traMADol (ULTRAM) 50 MG tablet TAKE 1 TABLET BY MOUTH EVERY 6 HOURS AS NEEDED FOR SPINAL STENOSIS PAIN 120 tablet 0  . Lancets (ONETOUCH ULTRASOFT) lancets Use as instructed 100 each 12   No facility-administered medications prior to visit.     Review of Systems;  Patient denies headache, fevers, malaise, unintentional weight loss, skin rash, eye pain, sinus congestion and sinus pain, sore throat, dysphagia,  hemoptysis , cough, dyspnea, wheezing, chest pain, palpitations, orthopnea, edema, abdominal pain, nausea,  melena, diarrhea, constipation, flank pain, dysuria, hematuria, urinary  Frequency, nocturia, seizures,  Focal weakness, Loss of consciousness,  Tremor, insomnia, depression, anxiety, and suicidal ideation.      Objective:  BP 130/68 (BP Location: Left Arm, Patient Position: Sitting, Cuff Size: Normal)   Pulse 63   Temp 97.6 F (36.4 C) (Temporal)   Resp 14   Ht 5\' 4"  (1.626 m)   Wt 154 lb 6.4 oz (70 kg)   SpO2 97%   BMI 26.50 kg/m   BP Readings from Last 3 Encounters:  04/17/19 130/68  02/01/19 124/67  01/17/19 128/72    Wt Readings from Last 3 Encounters:  04/17/19 154 lb 6.4 oz (70 kg)  04/05/19 150 lb (68 kg)  02/01/19 156 lb (70.8 kg)    General appearance: alert, cooperative and appears stated age Ears: normal TM's and external ear canals both ears Throat: lips, mucosa, and tongue normal; teeth and gums normal Neck: no adenopathy, no carotid bruit, supple, symmetrical, trachea midline and thyroid not enlarged, symmetric, no tenderness/mass/nodules Back: symmetric, no curvature. ROM normal. No CVA tenderness. Lungs: clear to auscultation bilaterally Heart: regular rate and rhythm, S1, S2 normal, no murmur, click, rub or gallop Abdomen: soft, non-tender; bowel sounds normal; no masses,  no organomegaly Pulses: 2+ and symmetric Skin: Skin color, texture, turgor normal. No rashes or lesions Lymph nodes: Cervical, supraclavicular, and axillary nodes normal.  Lab Results  Component Value Date   HGBA1C 7.4 (H) 04/14/2019   HGBA1C 6.9 (H) 10/26/2018   HGBA1C 6.4 (A) 04/11/2018    Lab Results  Component Value Date   CREATININE 0.87 04/14/2019   CREATININE 0.80 10/26/2018   CREATININE 0.95 05/05/2018    Lab Results  Component Value Date   WBC 6.4 04/21/2018   HGB 14.5 04/21/2018   HCT 42.7 04/21/2018   PLT 324.0 04/21/2018   GLUCOSE 139 (H) 04/14/2019   CHOL 114 04/14/2019   TRIG 132.0 04/14/2019   HDL 42.90 04/14/2019   LDLDIRECT 81.0 07/07/2017   LDLCALC  45 04/14/2019   ALT 12 04/14/2019   AST 13 04/14/2019   NA 140 04/14/2019   K 4.4 04/14/2019   CL 101 04/14/2019   CREATININE 0.87 04/14/2019   BUN 12 04/14/2019   CO2 31 04/14/2019   TSH 0.01 (L) 03/30/2019   INR 0.88 02/17/2018   HGBA1C 7.4 (H) 04/14/2019   MICROALBUR 0.7 04/14/2019    No results found.  Assessment & Plan:   Problem List Items Addressed This Visit      Unprioritized   DM type 2 (diabetes mellitus, type 2) ([place]) - Primary    Slight loss of control noted.  Attributed to lack of exercise since her dog died in early 2023/03/30 and  food choices .  Diet discussed in detail.  Asked to submit post prandial blood sugars in a few weeks before medication adjustments will be made  Lab Results  Component Value Date   HGBA1C 7.4 (H) 04/14/2019   Lab Results  Component Value Date   MICROALBUR 0.7 04/14/2019         Relevant Orders   Hemoglobin A1c   Comprehensive metabolic panel   HTN (hypertension), benign    Well controlled on current regimen. Renal function stable, no changes today.  Lab Results  Component Value Date   CREATININE 0.87 04/14/2019   Lab Results  Component Value Date   NA 140 04/14/2019   K 4.4 04/14/2019   CL 101 04/14/2019   CO2 31 04/14/2019         Type 2 diabetes mellitus with diabetic neuropathy, unspecified ([place])    With subtle changes suggesting claw foot on right.  Podiatry referral in progress        Other Visit Diagnoses    Claw toe, acquired, right       Relevant Orders   Ambulatory referral to Podiatry      I have discontinued Coralynn F. [place] onetouch ultrasoft. I am also having her maintain her aspirin EC, atorvastatin, albuterol, losartan, Levemir FlexTouch, ALPRAZolam, omeprazole, BD Pen Needle Nano U/F, metFORMIN, atenolol, methimazole, traMADol, hydrochlorothiazide, and OneTouch Verio.  No orders of the defined types were placed in this encounter.   Medications Discontinued During This Encounter   Medication Reason  . Lancets (ONETOUCH ULTRASOFT) lancets Error    Follow-up: No follow-ups on file.   Crecencio Mc, MD

## 2019-04-18 DIAGNOSIS — R69 Illness, unspecified: Secondary | ICD-10-CM | POA: Diagnosis not present

## 2019-04-18 MED ORDER — ONETOUCH DELICA LANCETS 30G MISC: 1.0000 "application " | Freq: Two times a day (BID) | 11 refills | Status: AC

## 2019-05-01 DIAGNOSIS — M5126 Other intervertebral disc displacement, lumbar region: Secondary | ICD-10-CM | POA: Insufficient documentation

## 2019-05-01 DIAGNOSIS — M1610 Unilateral primary osteoarthritis, unspecified hip: Secondary | ICD-10-CM | POA: Insufficient documentation

## 2019-05-01 DIAGNOSIS — M7500 Adhesive capsulitis of unspecified shoulder: Secondary | ICD-10-CM | POA: Insufficient documentation

## 2019-05-01 DIAGNOSIS — M47819 Spondylosis without myelopathy or radiculopathy, site unspecified: Secondary | ICD-10-CM | POA: Insufficient documentation

## 2019-05-02 ENCOUNTER — Other Ambulatory Visit: Payer: Self-pay

## 2019-05-02 ENCOUNTER — Ambulatory Visit (INDEPENDENT_AMBULATORY_CARE_PROVIDER_SITE_OTHER): Payer: Medicare HMO

## 2019-05-02 ENCOUNTER — Encounter: Payer: Self-pay | Admitting: Podiatry

## 2019-05-02 ENCOUNTER — Ambulatory Visit: Payer: Medicare HMO | Admitting: Podiatry

## 2019-05-02 DIAGNOSIS — E1140 Type 2 diabetes mellitus with diabetic neuropathy, unspecified: Secondary | ICD-10-CM

## 2019-05-02 DIAGNOSIS — M2041 Other hammer toe(s) (acquired), right foot: Secondary | ICD-10-CM

## 2019-05-02 DIAGNOSIS — Z794 Long term (current) use of insulin: Secondary | ICD-10-CM | POA: Diagnosis not present

## 2019-05-02 MED ORDER — GABAPENTIN 100 MG PO CAPS: 100.0000 mg | ORAL_CAPSULE | Freq: Three times a day (TID) | ORAL | 3 refills | Status: DC

## 2019-05-04 NOTE — Progress Notes (Signed)
   HPI: 73 y.o. female presenting today with a chief complaint of intermittent sharp pain of the bilateral feet, right worse than left, that began a few years ago. She reports associated feeling of pins and needles. She denies any known modifying factors. She has been taking Tramadol for pain prescribed by her PCP. Patient is here for further evaluation and treatment.   Past Medical History:  Diagnosis Date  . Actinic keratoses    follows with dermatology   . Arthritis   . Asthma   . Chronic back pain    DDD,herniated disc/spondylosis/radiculopathy  . COPD (chronic obstructive pulmonary disease) ([place])   . Diabetes mellitus    1980s  . Early cataracts, bilateral    Dr. Ruthine Dose - [facility]  . GERD (gastroesophageal reflux disease)    takes Omeprazole daily  . Hemorrhoids   . History of colonic polyps    colonoscopy 04/2012 - Dr Candace Cruise  . Hyperlipidemia    takes Pravastatin daily  . Hypertension    takes Metoprolol/HCTZ daily  . Hyperthyroidism   . Insomnia   . Joint pain    fingers  . Joint swelling   . Pneumonia    x 2 ;in Dec 2010/2011  . PONV (postoperative nausea and vomiting)   . Right leg weakness      Physical Exam: General: The patient is alert and oriented x3 in no acute distress.  Dermatology: Skin is warm, dry and supple bilateral lower extremities. Negative for open lesions or macerations.  Vascular: Palpable pedal pulses bilaterally. No edema or erythema noted. Capillary refill within normal limits.  Neurological: Epicritic and protective threshold diminished bilaterally.   Musculoskeletal Exam: Range of motion within normal limits to all pedal and ankle joints bilateral. Muscle strength 5/5 in all groups bilateral.   Radiographic Exam:  Normal osseous mineralization. Joint spaces preserved. No fracture/dislocation/boney destruction.    Assessment: 1. Peripheral neuropathy RLE 2. T2DM   Plan of Care:  1. Patient evaluated. X-Rays reviewed.   2. Prescription for Gabapentin 100 mg TID provided to patient.  3. Continue taking Tramadol 50 mg as directed by PCP.  4. Return to clinic as needed.       Edrick Kins, DPM [facility]  Dr. Edrick Kins, DPM    [address], [place] [address]                Office [phone]  Fax [phone]

## 2019-05-06 ENCOUNTER — Ambulatory Visit: Admit: 2019-05-06 | Payer: Medicare HMO | Admitting: Ophthalmology

## 2019-05-06 SURGERY — PHACOEMULSIFICATION, CATARACT, WITH IOL INSERTION
Anesthesia: Choice | Laterality: Left

## 2019-05-12 ENCOUNTER — Other Ambulatory Visit (INDEPENDENT_AMBULATORY_CARE_PROVIDER_SITE_OTHER): Payer: Medicare HMO

## 2019-05-12 ENCOUNTER — Other Ambulatory Visit: Payer: Self-pay

## 2019-05-12 DIAGNOSIS — E0590 Thyrotoxicosis, unspecified without thyrotoxic crisis or storm: Secondary | ICD-10-CM | POA: Diagnosis not present

## 2019-05-12 DIAGNOSIS — Z794 Long term (current) use of insulin: Secondary | ICD-10-CM | POA: Diagnosis not present

## 2019-05-12 DIAGNOSIS — E11649 Type 2 diabetes mellitus with hypoglycemia without coma: Secondary | ICD-10-CM | POA: Diagnosis not present

## 2019-05-12 LAB — TSH: TSH: <0.01 u[IU]/mL — ABNORMAL LOW (ref 0.35–4.50)

## 2019-05-12 LAB — T4, FREE: Free T4: 1.11 ng/dL (ref 0.60–1.60)

## 2019-05-13 ENCOUNTER — Other Ambulatory Visit: Payer: Self-pay | Admitting: Internal Medicine

## 2019-05-16 NOTE — Telephone Encounter (Signed)
Last OV 04/17/19 last fill 8/20 Ok to fill?

## 2019-05-17 ENCOUNTER — Encounter: Payer: Self-pay | Admitting: Endocrinology

## 2019-05-17 ENCOUNTER — Ambulatory Visit (INDEPENDENT_AMBULATORY_CARE_PROVIDER_SITE_OTHER): Payer: Medicare HMO | Admitting: Endocrinology

## 2019-05-17 ENCOUNTER — Other Ambulatory Visit: Payer: Self-pay

## 2019-05-17 DIAGNOSIS — E0590 Thyrotoxicosis, unspecified without thyrotoxic crisis or storm: Secondary | ICD-10-CM | POA: Diagnosis not present

## 2019-05-17 NOTE — Progress Notes (Signed)
Patient ID: Maria Hinton, female   DOB: [birth year], 73 y.o.   MRN: [id number]                                                                                                              Reason for Appointment:  Hyperthyroidism, follow-up visit   Referring physician: Derrel Nip  Today's office visit was provided via telemedicine using a telephone call to the patient Patient has been explained the limitations of evaluation and management by telemedicine and the availability of in person appointments.  The patient understood the limitations and agreed to proceed. Patient also understood that the telehealth visit is billable. . Location of the patient: Home . Location of the provider: Office Only the patient and myself were participating in the encounter    History of Present Illness:   Prior history:  For several months before her consultation she had symptoms of weakness, palpitations, shakiness, feeling sweaty in am, nervousness, and fatigue.  Also would feel a strong heartbeat in her ears She had been on atenolol for hypertension also She had a decreased appetite and started losing weight late last summer  The patient had lost about  26 lbs since these symptoms started   RECENT history:  She started taking methimazole in 3/19 for her hyperthyroidism On her 10/2017 visit she was taking 10 mg twice daily because of hyperthyroidism she was given 30 mg daily  In follow-up she has been requiring periodically reduced doses of her methimazole  However since late 2019 she appears to be needing progressively higher doses of methimazole  On her visit in 12/19 she was having fatigue and weakness; did not complain of feeling shaky or having palpitations, no weight loss However her thyroid levels were significantly high with 7.5 mg of methimazole The dose was increased  RECENT history:  In 4/20 she was having problems with feeling sleepy and tired and having less motivation her TSH with  taking 15 mg methimazole was significantly higher at 14.5 and low normal free T4 Subsequently her methimazole has been reduced progressively and in July was taking only 5 mg daily using half of the 10 mg tablet  Her methimazole was stopped on 01/17/2019  However on her visit on 02/28/2019 she was having symptoms of feeling hot and sweaty, also a little more tired and weak but no palpitations or significant weight loss  TSH is was then suppressed and her free T4 is relatively high at 1.3 compared to 0.9, no change in free T3  She is now taking methimazole 5 mg every day using 10 mg tablets  She feels a little better with her energy but still gets hot at times No shakiness or palpitations She is also having other problems like her feet hurting from reportedly neuropathy  Her weight at home is 152  Her free T4 is improved although TSH still suppressed  Wt Readings from Last 3 Encounters:  04/17/19 154 lb 6.4 oz (70 kg)  04/05/19 150 lb (68 kg)  02/01/19 156 lb (70.8 kg)  Thyrotropin receptor antibody mildly increased but better than in March  Thyroid function tests as follows:     Lab Results  Component Value Date   FREET4 1.11 05/12/2019   FREET4 1.33 03/30/2019   FREET4 1.30 02/22/2019   T3FREE 3.5 03/30/2019   T3FREE 3.1 02/23/2019   T3FREE 3.1 11/30/2018   TSH <0.01 (L) 05/12/2019   TSH 0.01 (L) 03/30/2019   TSH 0.04 (L) 02/22/2019    Lab Results  Component Value Date   THYROTRECAB 1.79 (H) 03/30/2019   THYROTRECAB 2.53 (H) 09/07/2018   THYROTRECAB 2.05 (H) 06/21/2018     Allergies as of 05/17/2019      Reactions   Vicodin [hydrocodone-acetaminophen] Other (See Comments)   Insomnia       Medication List       Accurate as of May 17, 2019 10:06 AM. If you have any questions, ask your nurse or doctor.        albuterol 108 (90 Base) MCG/ACT inhaler Commonly known as: VENTOLIN HFA Inhale 2 puffs into the lungs every 6 (six) hours as needed for  wheezing.   ALPRAZolam 0.25 MG tablet Commonly known as: XANAX TAKE 1 TABLET BY MOUTH TWICE A DAY AS NEEDED   aspirin EC 81 MG tablet Take 81 mg by mouth daily.   atenolol 50 MG tablet Commonly known as: TENORMIN TAKE 1 TABLET BY MOUTH TWICE A DAY   atorvastatin 40 MG tablet Commonly known as: LIPITOR TAKE 1 TABLET BY MOUTH DAILY AT 6 PM.   BD Pen Needle Nano U/F 32G X 4 MM Misc Generic drug: Insulin Pen Needle USE DAILY AS DIRECTED WITH LEVEMIR   gabapentin 100 MG capsule Commonly known as: NEURONTIN Take 1 capsule (100 mg total) by mouth 3 (three) times daily.   hydrochlorothiazide 12.5 MG tablet Commonly known as: HYDRODIURIL TAKE 1 TABLET (12.5 MG TOTAL) BY MOUTH DAILY.   Levemir FlexTouch 100 UNIT/ML Pen Generic drug: Insulin Detemir INJECT 15 UNITS INTO THE SKIN DAILY AT 10 PM.   losartan 50 MG tablet Commonly known as: COZAAR Take 1 tablet (50 mg total) by mouth daily.   metFORMIN 1000 MG tablet Commonly known as: GLUCOPHAGE TAKE 1 TABLET BY MOUTH 2 TIMES DAILY WITH A MEAL.   methimazole 10 MG tablet Commonly known as: TAPAZOLE TAKE 1 TABLET BY MOUTH TWICE A DAY   omeprazole 40 MG capsule Commonly known as: PRILOSEC TAKE 1 CAPSULE BY MOUTH EVERY DAY   OneTouch Delica Lancets 99991111 Misc 1 application by Does not apply route 2 (two) times daily. Use to check blood sugar up to two times daily. E11.69   OneTouch Verio test strip Generic drug: glucose blood USE AS INSTRUCTED   traMADol 50 MG tablet Commonly known as: ULTRAM TAKE 1 TABLET BY MOUTH EVERY 6 HOURS AS NEEDED FOR SPINAL STENOSIS PAIN           Past Medical History:  Diagnosis Date  . Actinic keratoses    follows with dermatology   . Arthritis   . Asthma   . Chronic back pain    DDD,herniated disc/spondylosis/radiculopathy  . COPD (chronic obstructive pulmonary disease) ([place])   . Diabetes mellitus    1980s  . Early cataracts, bilateral    Dr. Ruthine Dose - [facility]  .  GERD (gastroesophageal reflux disease)    takes Omeprazole daily  . Hemorrhoids   . History of colonic polyps    colonoscopy 04/2012 - Dr Candace Cruise  . Hyperlipidemia    takes  Pravastatin daily  . Hypertension    takes Metoprolol/HCTZ daily  . Hyperthyroidism   . Insomnia   . Joint pain    fingers  . Joint swelling   . Pneumonia    x 2 ;in Dec 2010/2011  . PONV (postoperative nausea and vomiting)   . Right leg weakness     Past Surgical History:  Procedure Laterality Date  . ABDOMINAL HYSTERECTOMY     at age 28  . BACK SURGERY  20+yrs ago  . CATARACT EXTRACTION W/PHACO Left 01/04/2019   Procedure: CATARACT EXTRACTION PHACO AND INTRAOCULAR LENS PLACEMENT ([place])  LEFT;  Surgeon: Leandrew Koyanagi, MD;  Location: [place];  Service: Ophthalmology;  Laterality: Left;  GIVE IV ZOFRAN  . CATARACT EXTRACTION W/PHACO Right 02/01/2019   Procedure: CATARACT EXTRACTION PHACO AND INTRAOCULAR LENS PLACEMENT ([place])  RIGHT DIABETIC;  Surgeon: Leandrew Koyanagi, MD;  Location: [place];  Service: Ophthalmology;  Laterality: Right;  DIABETIC GIVE IV ZOFRAN  . COLONOSCOPY    . COLONOSCOPY N/A 05/13/2015   Procedure: COLONOSCOPY;  Surgeon: Hulen Luster, MD;  Location[facility] ENDOSCOPY;  Service: Gastroenterology;  Laterality: N/A;  . ESOPHAGOGASTRODUODENOSCOPY  2011  . ESOPHAGOGASTRODUODENOSCOPY (EGD) WITH PROPOFOL N/A 09/02/2017   Procedure: ESOPHAGOGASTRODUODENOSCOPY (EGD) WITH PROPOFOL;  Surgeon: Virgel Manifold, MD;  Location: [facility] ENDOSCOPY;  Service: Endoscopy;  Laterality: N/A;  . FOOT SURGERY  2011   bunionectomy and knot removed from bottom of both feet  . JOINT REPLACEMENT Left 2014   hip  Kraskinski  . LUMBAR LAMINECTOMY/DECOMPRESSION MICRODISCECTOMY  09/10/2011   Procedure: LUMBAR LAMINECTOMY/DECOMPRESSION MICRODISCECTOMY 1 LEVEL;  Surgeon: Hosie Spangle, MD;  Location: [place] NEURO ORS;  Service: Neurosurgery;  Laterality: Right;  RIGHT Lumbar  Laminotomy and  microdiskectomy Lumbar Four-Five  . LUMBAR LAMINECTOMY/DECOMPRESSION MICRODISCECTOMY N/A 06/26/2015   Procedure: LUMBAR LAMINECTOMY/DECOMPRESSION MICRODISCECTOMY 1 LEVEL;  Surgeon: Jovita Gamma, MD;  Location: [place] NEURO ORS;  Service: Neurosurgery;  Laterality: N/A;  L3 and L4 Laminectomies  . MELANOMA EXCISION  2011   on nose/back on neck  . THORACIC DISC SURGERY  at age 70 and 72    mass of veins that had gotten into muscle and wrapped around rib;3 ribs also removed from left side  . TONSILLECTOMY  as a child   and adenoids   . TOTAL HIP ARTHROPLASTY Left 2014   Dr. Mack Guise  . TUBAL LIGATION    . VAGINA SURGERY     vaginal wall ruptured     Family History  Problem Relation Age of Onset  . Heart disease Brother        valve replacement  . COPD Brother   . COPD Mother        Emphysema  . Diabetes Mother   . Other Mother        Died in house fire at age 60.  Marland Kitchen Heart disease Father   . Seizures Father   . Cancer Father        Lung cancer with brain metastasis  . Asthma Brother   . COPD Son 28  . Cancer Other        aunt ? maternal vs paternal side lung cancer   . Diabetes Maternal Grandmother   . Anesthesia problems Neg Hx   . Hypotension Neg Hx   . Malignant hyperthermia Neg Hx   . Pseudochol deficiency Neg Hx   . Thyroid disease Neg Hx   . Breast cancer Neg Hx     Social History:  reports  that she quit smoking about 2 years ago. Her smoking use included cigarettes. She has a 5.00 pack-year smoking history. She has never used smokeless tobacco. She reports that she does not drink alcohol or use drugs.  Allergies:  Allergies  Allergen Reactions  . Vicodin [Hydrocodone-Acetaminophen] Other (See Comments)    Insomnia      Review of Systems  She is on insulin for diabetes and followed by PCP A1c as follows She has fasting readings around 135-140 but postprandial readings can be as high as 300 She is due for follow-up with PCP   Lab Results  Component Value  Date   HGBA1C 7.4 (H) 04/14/2019   HGBA1C 6.9 (H) 10/26/2018   HGBA1C 6.4 (A) 04/11/2018   Lab Results  Component Value Date   MICROALBUR 0.7 04/14/2019   LDLCALC 45 04/14/2019   CREATININE 0.87 04/14/2019      Examination:   There were no vitals taken for this visit.  No exam done, patient is remote   Assessment/Plan:   Hyperthyroidism, diagnosed to be from Graves' disease, on antithyroid drug treatment since 09/2017 No previous history of goiter  She is continued on 5 mg methimazole for her relapse since 02/2019  She does still have some nonspecific symptoms of fatigue and heat intolerance at times but no palpitations or shakiness No recent unexpected weight loss  She will continue on the 5 mg methimazole dose and follow-up in 2 months If she has any change in symptoms before that she can call for earlier appointment  Will not schedule I-131 treatment as yet since her thyrotropin receptor antibody is nearly normal as of 9/20  No telephone encounter =6 minutes  Elayne Snare 05/17/2019, 10:06 AM    Note: This office note was prepared with Dragon voice recognition system technology. Any transcriptional errors that result from this process are unintentional.

## 2019-05-19 DIAGNOSIS — M5136 Other intervertebral disc degeneration, lumbar region: Secondary | ICD-10-CM | POA: Diagnosis not present

## 2019-05-19 DIAGNOSIS — Z9889 Other specified postprocedural states: Secondary | ICD-10-CM | POA: Diagnosis not present

## 2019-05-19 DIAGNOSIS — M47816 Spondylosis without myelopathy or radiculopathy, lumbar region: Secondary | ICD-10-CM | POA: Diagnosis not present

## 2019-05-19 DIAGNOSIS — M48062 Spinal stenosis, lumbar region with neurogenic claudication: Secondary | ICD-10-CM | POA: Diagnosis not present

## 2019-05-24 ENCOUNTER — Other Ambulatory Visit: Payer: Self-pay | Admitting: Internal Medicine

## 2019-05-25 NOTE — Telephone Encounter (Signed)
Refilled tramadol for 3 months

## 2019-07-11 ENCOUNTER — Other Ambulatory Visit: Payer: Self-pay

## 2019-07-13 ENCOUNTER — Other Ambulatory Visit (INDEPENDENT_AMBULATORY_CARE_PROVIDER_SITE_OTHER): Payer: Medicare HMO

## 2019-07-13 ENCOUNTER — Other Ambulatory Visit: Payer: Self-pay

## 2019-07-13 DIAGNOSIS — Z794 Long term (current) use of insulin: Secondary | ICD-10-CM

## 2019-07-13 DIAGNOSIS — E11649 Type 2 diabetes mellitus with hypoglycemia without coma: Secondary | ICD-10-CM

## 2019-07-13 DIAGNOSIS — E0590 Thyrotoxicosis, unspecified without thyrotoxic crisis or storm: Secondary | ICD-10-CM

## 2019-07-13 LAB — TSH: TSH: 1.04 u[IU]/mL (ref 0.35–4.50)

## 2019-07-13 LAB — T4, FREE: Free T4: 0.97 ng/dL (ref 0.60–1.60)

## 2019-07-13 LAB — T3, FREE: T3, Free: 2.6 pg/mL (ref 2.3–4.2)

## 2019-07-14 LAB — THYROTROPIN RECEPTOR AUTOABS: Thyrotropin Receptor Ab: 1.66 IU/L (ref 0.00–1.75)

## 2019-07-15 DIAGNOSIS — R69 Illness, unspecified: Secondary | ICD-10-CM | POA: Diagnosis not present

## 2019-07-21 ENCOUNTER — Other Ambulatory Visit: Payer: Self-pay

## 2019-07-21 ENCOUNTER — Ambulatory Visit (INDEPENDENT_AMBULATORY_CARE_PROVIDER_SITE_OTHER): Payer: Medicare HMO | Admitting: Endocrinology

## 2019-07-21 ENCOUNTER — Encounter: Payer: Self-pay | Admitting: Endocrinology

## 2019-07-21 DIAGNOSIS — E0590 Thyrotoxicosis, unspecified without thyrotoxic crisis or storm: Secondary | ICD-10-CM | POA: Diagnosis not present

## 2019-07-21 MED ORDER — METHIMAZOLE 5 MG PO TABS: ORAL_TABLET | ORAL | 1 refills | Status: DC

## 2019-07-21 NOTE — Progress Notes (Signed)
Patient ID: Maria Hinton, female   DOB: [birth year], 74 y.o.   MRN: [id number]                                                                                                              Reason for Appointment:  Hyperthyroidism, follow-up visit   Referring physician: Derrel Nip  Today's office visit was provided via telemedicine using a telephone call to the patient Patient has been explained the limitations of evaluation and management by telemedicine and the availability of in person appointments.  The patient understood the limitations and agreed to proceed. Patient also understood that the telehealth visit is billable. . Location of the patient: Home . Location of the provider: Office Only the patient and myself were participating in the encounter    History of Present Illness:   Prior history:  For several months before her consultation she had symptoms of weakness, palpitations, shakiness, feeling sweaty in am, nervousness, and fatigue.  Also would feel a strong heartbeat in her ears She had been on atenolol for hypertension also She had a decreased appetite and started losing weight late last summer  The patient had lost about  26 lbs since these symptoms started   RECENT history:  She started taking methimazole in 3/19 for her hyperthyroidism On her 10/2017 visit she was taking 10 mg twice daily because of hyperthyroidism she was given 30 mg daily  In follow-up she has been requiring periodically reduced doses of her methimazole  However since late 2019 she appears to be needing progressively higher doses of methimazole  On her visit in 12/19 she was having fatigue and weakness; did not complain of feeling shaky or having palpitations, no weight loss However her thyroid levels were significantly high with 7.5 mg of methimazole The dose was increased  RECENT history:  In 4/20 she was having problems with feeling sleepy and tired and having less motivation her TSH with  taking 15 mg methimazole was significantly higher at 14.5 and low normal free T4 Subsequently her methimazole has been reduced progressively and in July was taking only 5 mg daily using half of the 10 mg tablet  Her methimazole was stopped on 01/17/2019  However on her visit on 02/28/2019 she was having symptoms of feeling hot and sweaty, also a little more tired and weak but no palpitations or significant weight loss  TSH is was then suppressed and her free T4 is relatively high at 1.3 compared to 0.9, no change in free T3  She is continuing to be taking methimazole 5 mg every day using 10 mg tablets  She does not have any heat or cold intolerance or fatigue No shakiness or palpitations Appetite is normal  Her weight at home is between 150-154  Her free T4 is improved however free T3 lower and TSH not suppressed  Wt Readings from Last 3 Encounters:  04/17/19 154 lb 6.4 oz (70 kg)  04/05/19 150 lb (68 kg)  02/01/19 156 lb (70.8 kg)     Thyrotropin receptor antibody  upper normal and gradually improving   Thyroid function tests as follows:     Lab Results  Component Value Date   FREET4 0.97 07/13/2019   FREET4 1.11 05/12/2019   FREET4 1.33 03/30/2019   T3FREE 2.6 07/13/2019   T3FREE 3.5 03/30/2019   T3FREE 3.1 02/23/2019   TSH 1.04 07/13/2019   TSH <0.01 (L) 05/12/2019   TSH 0.01 (L) 03/30/2019    Lab Results  Component Value Date   THYROTRECAB 1.66 07/13/2019   THYROTRECAB 1.79 (H) 03/30/2019   THYROTRECAB 2.53 (H) 09/07/2018     Allergies as of 07/21/2019      Reactions   Vicodin [hydrocodone-acetaminophen] Other (See Comments)   Insomnia       Medication List       Accurate as of July 21, 2019  8:07 AM. If you have any questions, ask your nurse or doctor.        albuterol 108 (90 Base) MCG/ACT inhaler Commonly known as: VENTOLIN HFA Inhale 2 puffs into the lungs every 6 (six) hours as needed for wheezing.   ALPRAZolam 0.25 MG tablet Commonly known  as: XANAX TAKE 1 TABLET BY MOUTH TWICE A DAY AS NEEDED   aspirin EC 81 MG tablet Take 81 mg by mouth daily.   atenolol 50 MG tablet Commonly known as: TENORMIN TAKE 1 TABLET BY MOUTH TWICE A DAY   atorvastatin 40 MG tablet Commonly known as: LIPITOR TAKE 1 TABLET BY MOUTH DAILY AT 6 PM.   BD Pen Needle Nano U/F 32G X 4 MM Misc Generic drug: Insulin Pen Needle USE DAILY AS DIRECTED WITH LEVEMIR   gabapentin 100 MG capsule Commonly known as: NEURONTIN Take 1 capsule (100 mg total) by mouth 3 (three) times daily.   hydrochlorothiazide 12.5 MG tablet Commonly known as: HYDRODIURIL TAKE 1 TABLET (12.5 MG TOTAL) BY MOUTH DAILY.   Levemir FlexTouch 100 UNIT/ML Pen Generic drug: Insulin Detemir INJECT 15 UNITS INTO THE SKIN DAILY AT 10 PM.   losartan 50 MG tablet Commonly known as: COZAAR Take 1 tablet (50 mg total) by mouth daily.   metFORMIN 1000 MG tablet Commonly known as: GLUCOPHAGE TAKE 1 TABLET BY MOUTH 2 TIMES DAILY WITH A MEAL.   methimazole 10 MG tablet Commonly known as: TAPAZOLE TAKE 1 TABLET BY MOUTH TWICE A DAY   omeprazole 40 MG capsule Commonly known as: PRILOSEC TAKE 1 CAPSULE BY MOUTH EVERY DAY   OneTouch Delica Lancets 99991111 Misc 1 application by Does not apply route 2 (two) times daily. Use to check blood sugar up to two times daily. E11.69   OneTouch Verio test strip Generic drug: glucose blood USE AS INSTRUCTED   traMADol 50 MG tablet Commonly known as: ULTRAM TAKE 1 TABLET BY MOUTH EVERY 6 HOURS AS NEEDED FOR SPINAL STENOSIS PAIN           Past Medical History:  Diagnosis Date  . Actinic keratoses    follows with dermatology   . Arthritis   . Asthma   . Chronic back pain    DDD,herniated disc/spondylosis/radiculopathy  . COPD (chronic obstructive pulmonary disease) ([place])   . Diabetes mellitus    1980s  . Early cataracts, bilateral    Dr. Ruthine Dose - [facility]  . GERD (gastroesophageal reflux disease)    takes Omeprazole  daily  . Hemorrhoids   . History of colonic polyps    colonoscopy 04/2012 - Dr Candace Cruise  . Hyperlipidemia    takes Pravastatin daily  . Hypertension  takes Metoprolol/HCTZ daily  . Hyperthyroidism   . Insomnia   . Joint pain    fingers  . Joint swelling   . Pneumonia    x 2 ;in Dec 2010/2011  . PONV (postoperative nausea and vomiting)   . Right leg weakness     Past Surgical History:  Procedure Laterality Date  . ABDOMINAL HYSTERECTOMY     at age 15  . BACK SURGERY  20+yrs ago  . CATARACT EXTRACTION W/PHACO Left 01/04/2019   Procedure: CATARACT EXTRACTION PHACO AND INTRAOCULAR LENS PLACEMENT ([place])  LEFT;  Surgeon: Leandrew Koyanagi, MD;  Location: [place];  Service: Ophthalmology;  Laterality: Left;  GIVE IV ZOFRAN  . CATARACT EXTRACTION W/PHACO Right 02/01/2019   Procedure: CATARACT EXTRACTION PHACO AND INTRAOCULAR LENS PLACEMENT ([place])  RIGHT DIABETIC;  Surgeon: Leandrew Koyanagi, MD;  Location: [place];  Service: Ophthalmology;  Laterality: Right;  DIABETIC GIVE IV ZOFRAN  . COLONOSCOPY    . COLONOSCOPY N/A 05/13/2015   Procedure: COLONOSCOPY;  Surgeon: Hulen Luster, MD;  Location: [facility] ENDOSCOPY;  Service: Gastroenterology;  Laterality: N/A;  . ESOPHAGOGASTRODUODENOSCOPY  2011  . ESOPHAGOGASTRODUODENOSCOPY (EGD) WITH PROPOFOL N/A 09/02/2017   Procedure: ESOPHAGOGASTRODUODENOSCOPY (EGD) WITH PROPOFOL;  Surgeon: Virgel Manifold, MD;  Location: [facility] ENDOSCOPY;  Service: Endoscopy;  Laterality: N/A;  . FOOT SURGERY  2011   bunionectomy and knot removed from bottom of both feet  . JOINT REPLACEMENT Left 2014   hip  Kraskinski  . LUMBAR LAMINECTOMY/DECOMPRESSION MICRODISCECTOMY  09/10/2011   Procedure: LUMBAR LAMINECTOMY/DECOMPRESSION MICRODISCECTOMY 1 LEVEL;  Surgeon: Hosie Spangle, MD;  Location: [place] NEURO ORS;  Service: Neurosurgery;  Laterality: Right;  RIGHT Lumbar  Laminotomy and microdiskectomy Lumbar Four-Five  . LUMBAR LAMINECTOMY/DECOMPRESSION  MICRODISCECTOMY N/A 06/26/2015   Procedure: LUMBAR LAMINECTOMY/DECOMPRESSION MICRODISCECTOMY 1 LEVEL;  Surgeon: Jovita Gamma, MD;  Location: [place] NEURO ORS;  Service: Neurosurgery;  Laterality: N/A;  L3 and L4 Laminectomies  . MELANOMA EXCISION  2011   on nose/back on neck  . THORACIC DISC SURGERY  at age 26 and 59    mass of veins that had gotten into muscle and wrapped around rib;3 ribs also removed from left side  . TONSILLECTOMY  as a child   and adenoids   . TOTAL HIP ARTHROPLASTY Left 2014   Dr. Mack Guise  . TUBAL LIGATION    . VAGINA SURGERY     vaginal wall ruptured     Family History  Problem Relation Age of Onset  . Heart disease Brother        valve replacement  . COPD Brother   . COPD Mother        Emphysema  . Diabetes Mother   . Other Mother        Died in house fire at age 69.  Marland Kitchen Heart disease Father   . Seizures Father   . Cancer Father        Lung cancer with brain metastasis  . Asthma Brother   . COPD Son 58  . Cancer Other        aunt ? maternal vs paternal side lung cancer   . Diabetes Maternal Grandmother   . Anesthesia problems Neg Hx   . Hypotension Neg Hx   . Malignant hyperthermia Neg Hx   . Pseudochol deficiency Neg Hx   . Thyroid disease Neg Hx   . Breast cancer Neg Hx     Social History:  reports that she quit smoking about 3 years ago.  Her smoking use included cigarettes. She has a 5.00 pack-year smoking history. She has never used smokeless tobacco. She reports that she does not drink alcohol or use drugs.  Allergies:  Allergies  Allergen Reactions  . Vicodin [Hydrocodone-Acetaminophen] Other (See Comments)    Insomnia      Review of Systems  She is on insulin for diabetes and followed by PCP A1c as follows    Lab Results  Component Value Date   HGBA1C 7.4 (H) 04/14/2019   HGBA1C 6.9 (H) 10/26/2018   HGBA1C 6.4 (A) 04/11/2018   Lab Results  Component Value Date   MICROALBUR 0.7 04/14/2019   LDLCALC 45 04/14/2019    CREATININE 0.87 04/14/2019      Examination:   There were no vitals taken for this visit.  No exam done, patient is remote   Assessment/Plan:   Hyperthyroidism, diagnosed to be from Graves' disease, on antithyroid drug treatment since 09/2017 No previous history of goiter  She is on 5 mg methimazole consistently for her recurrent hyperthyroidism since 02/2019 Subjectively has been doing well without any recurrence of previous symptoms or any symptoms suggestive of hypothyroidism  Her T4 and T3 levels are gradually decreasing and now her TSH is back in the normal range and not suppressed Thyrotropin receptor antibody is now upper normal and not high as before  This indicates that she will likely be in remission soon However we will try to taper off her methimazole more gradually this time since she had a relapse after her previous discontinuation She will take 5 mg alternating with 2.5 mg and follow-up in 6 weeks  Duration of telephone encounter =5 minutes  Elayne Snare 07/21/2019, 8:07 AM    Note: This office note was prepared with Dragon voice recognition system technology. Any transcriptional errors that result from this process are unintentional.

## 2019-07-27 ENCOUNTER — Other Ambulatory Visit: Payer: Self-pay | Admitting: Internal Medicine

## 2019-07-27 DIAGNOSIS — Z1231 Encounter for screening mammogram for malignant neoplasm of breast: Secondary | ICD-10-CM

## 2019-08-18 ENCOUNTER — Other Ambulatory Visit: Payer: Self-pay | Admitting: Internal Medicine

## 2019-08-22 ENCOUNTER — Ambulatory Visit
Admission: RE | Admit: 2019-08-22 | Discharge: 2019-08-22 | Disposition: A | Discharge status: Home / Self Care | Payer: Medicare HMO | Source: Ambulatory Visit | Attending: Internal Medicine | Admitting: Internal Medicine

## 2019-08-22 DIAGNOSIS — Z1231 Encounter for screening mammogram for malignant neoplasm of breast: Secondary | ICD-10-CM

## 2019-08-25 ENCOUNTER — Other Ambulatory Visit: Payer: Self-pay | Admitting: Internal Medicine

## 2019-08-25 NOTE — Telephone Encounter (Signed)
Refilled: 05/16/2019 Last OV: 04/17/2019 Next OV: 09/06/2019

## 2019-08-30 ENCOUNTER — Other Ambulatory Visit: Payer: Self-pay

## 2019-08-30 ENCOUNTER — Other Ambulatory Visit (INDEPENDENT_AMBULATORY_CARE_PROVIDER_SITE_OTHER): Payer: Medicare HMO

## 2019-08-30 DIAGNOSIS — Z794 Long term (current) use of insulin: Secondary | ICD-10-CM | POA: Diagnosis not present

## 2019-08-30 DIAGNOSIS — E0590 Thyrotoxicosis, unspecified without thyrotoxic crisis or storm: Secondary | ICD-10-CM

## 2019-08-30 DIAGNOSIS — E11649 Type 2 diabetes mellitus with hypoglycemia without coma: Secondary | ICD-10-CM

## 2019-08-30 LAB — T3, FREE: T3, Free: 3.1 pg/mL (ref 2.3–4.2)

## 2019-08-30 LAB — TSH: TSH: 3.04 u[IU]/mL (ref 0.35–4.50)

## 2019-08-30 LAB — T4, FREE: Free T4: 0.94 ng/dL (ref 0.60–1.60)

## 2019-08-30 LAB — COMPREHENSIVE METABOLIC PANEL
ALT: 10 U/L (ref 0–35)
AST: 11 U/L (ref 0–37)
Albumin: 3.8 g/dL (ref 3.5–5.2)
Alkaline Phosphatase: 107 U/L (ref 39–117)
BUN: 19 mg/dL (ref 6–23)
CO2: 30 mEq/L (ref 19–32)
Calcium: 7.9 mg/dL — ABNORMAL LOW (ref 8.4–10.5)
Chloride: 97 mEq/L (ref 96–112)
Creatinine, Ser: 0.98 mg/dL (ref 0.40–1.20)
GFR: 55.86 mL/min — ABNORMAL LOW (ref >60.00)
Glucose, Bld: 131 mg/dL — ABNORMAL HIGH (ref 70–99)
Potassium: 3.5 mEq/L (ref 3.5–5.1)
Sodium: 135 mEq/L (ref 135–145)
Total Bilirubin: 0.4 mg/dL (ref 0.2–1.2)
Total Protein: 6.6 g/dL (ref 6.0–8.3)

## 2019-08-31 ENCOUNTER — Other Ambulatory Visit: Payer: Self-pay

## 2019-08-31 LAB — HEMOGLOBIN A1C: Hgb A1c MFr Bld: 7.8 % — ABNORMAL HIGH (ref 4.6–6.5)

## 2019-09-01 ENCOUNTER — Ambulatory Visit: Payer: Medicare HMO | Attending: Internal Medicine

## 2019-09-01 DIAGNOSIS — Z23 Encounter for immunization: Secondary | ICD-10-CM | POA: Insufficient documentation

## 2019-09-01 NOTE — Progress Notes (Signed)
   Covid-19 Vaccination Clinic  Name:  Maria Hinton    MRN: [id number] DOB: 1946/03/26  09/01/2019  [name] was observed post Covid-19 immunization for 15 minutes without incidence. She was provided with Vaccine Information Sheet and instruction to access the V-Safe system.   [name] was instructed to call 911 with any severe reactions post vaccine: Marland Kitchen Difficulty breathing  . Swelling of your face and throat  . A fast heartbeat  . A bad rash all over your body  . Dizziness and weakness    Immunizations Administered    Name Date Dose VIS Date Route   Pfizer COVID-19 Vaccine 09/01/2019 12:08 PM 0.3 mL 06/16/2019 Intramuscular   Manufacturer: [place]   Lot: [id number]   [place]: [id number]

## 2019-09-04 ENCOUNTER — Ambulatory Visit (INDEPENDENT_AMBULATORY_CARE_PROVIDER_SITE_OTHER): Payer: Medicare HMO | Admitting: Endocrinology

## 2019-09-04 ENCOUNTER — Other Ambulatory Visit: Payer: Self-pay

## 2019-09-04 ENCOUNTER — Ambulatory Visit (INDEPENDENT_AMBULATORY_CARE_PROVIDER_SITE_OTHER): Payer: Medicare HMO

## 2019-09-04 ENCOUNTER — Encounter: Payer: Self-pay | Admitting: Endocrinology

## 2019-09-04 VITALS — Ht 64.0 in | Wt 154.0 lb

## 2019-09-04 DIAGNOSIS — Z0000 Encounter for general adult medical examination without abnormal findings: Secondary | ICD-10-CM

## 2019-09-04 DIAGNOSIS — E0590 Thyrotoxicosis, unspecified without thyrotoxic crisis or storm: Secondary | ICD-10-CM

## 2019-09-04 NOTE — Progress Notes (Signed)
Patient ID: Maria Hinton, female   DOB: 02-15-1946, 74 y.o.   MRN: [id number]                                                                                                              Reason for Appointment:  Hyperthyroidism, follow-up visit   Referring physician: Derrel Nip  Today's office visit was provided via telemedicine using a telephone call to the patient Patient has been explained the limitations of evaluation and management by telemedicine and the availability of in person appointments.  The patient understood the limitations and agreed to proceed. Patient also understood that the telehealth visit is billable. . Location of the patient: Home . Location of the provider: Office Only the patient and myself were participating in the encounter    History of Present Illness:   Prior history:  For several months before her consultation she had symptoms of weakness, palpitations, shakiness, feeling sweaty in am, nervousness, and fatigue.  Also would feel a strong heartbeat in her ears She had been on atenolol for hypertension also She had a decreased appetite and started losing weight late last summer  The patient had lost about  26 lbs since these symptoms started   RECENT history:  She started taking methimazole in 09/2017 for her hyperthyroidism On her 10/2017 visit she was taking 10 mg twice daily because of hyperthyroidism she was given 30 mg daily  In follow-up she has been requiring periodically reduced doses of her methimazole  However since late 2019 she appears to be needing progressively higher doses of methimazole  On her visit in 12/19 she was having fatigue and weakness; did not complain of feeling shaky or having palpitations, no weight loss However her thyroid levels were significantly high with 7.5 mg of methimazole The dose was increased  RECENT history:  In 4/20 she was having problems with feeling sleepy and tired and having less motivation her TSH with  taking 15 mg methimazole was significantly higher at 14.5 and low normal free T4 Subsequently her methimazole has been reduced progressively and in July was taking only 5 mg daily using half of the 10 mg tablet  Her methimazole was stopped on 01/17/2019  However on her visit on 02/28/2019 she was having symptoms of feeling hot and sweaty, also a little more tired and weak but no palpitations or significant weight loss TSH is was then suppressed and her free T4 was relatively high at 1.3 and she was started back on methimazole  She is continuing to be taking methimazole 5 mg, now alternating with 2.5 mg since 1/21  She was feeling fairly good on her last visit and continues to have no symptoms of fatigue, palpitations or cold intolerance For the last couple of days has had vertigo and GI symptoms  Otherwise her appetite has been normal Her weight fluctuates and has gone up because of poor diet   Her free T4 is about the same however TSH is now about 3 compared to 1.0 in 07/2019   Wt Readings  from Last 3 Encounters:  09/04/19 154 lb (69.9 kg)  04/17/19 154 lb 6.4 oz (70 kg)  04/05/19 150 lb (68 kg)     Thyrotropin receptor antibody upper normal and gradually improving   Thyroid function tests as follows:     Lab Results  Component Value Date   FREET4 0.94 08/30/2019   FREET4 0.97 07/13/2019   FREET4 1.11 05/12/2019   T3FREE 3.1 08/30/2019   T3FREE 2.6 07/13/2019   T3FREE 3.5 03/30/2019   TSH 3.04 08/30/2019   TSH 1.04 07/13/2019   TSH <0.01 (L) 05/12/2019    Lab Results  Component Value Date   THYROTRECAB 1.66 07/13/2019   THYROTRECAB 1.79 (H) 03/30/2019   THYROTRECAB 2.53 (H) 09/07/2018     Allergies as of 09/04/2019      Reactions   Vicodin [hydrocodone-acetaminophen] Other (See Comments)   Insomnia       Medication List       Accurate as of September 04, 2019  1:10 PM. If you have any questions, ask your nurse or doctor.        albuterol 108 (90 Base) MCG/ACT  inhaler Commonly known as: VENTOLIN HFA Inhale 2 puffs into the lungs every 6 (six) hours as needed for wheezing.   ALPRAZolam 0.25 MG tablet Commonly known as: XANAX TAKE 1 TABLET BY MOUTH TWICE A DAY AS NEEDED   aspirin EC 81 MG tablet Take 81 mg by mouth daily.   atenolol 50 MG tablet Commonly known as: TENORMIN TAKE 1 TABLET BY MOUTH TWICE A DAY   atorvastatin 40 MG tablet Commonly known as: LIPITOR TAKE 1 TABLET BY MOUTH DAILY AT 6 PM.   BD Pen Needle Nano U/F 32G X 4 MM Misc Generic drug: Insulin Pen Needle USE DAILY AS DIRECTED WITH LEVEMIR   gabapentin 100 MG capsule Commonly known as: NEURONTIN Take 1 capsule (100 mg total) by mouth 3 (three) times daily.   hydrochlorothiazide 12.5 MG tablet Commonly known as: HYDRODIURIL TAKE 1 TABLET (12.5 MG TOTAL) BY MOUTH DAILY.   Levemir FlexTouch 100 UNIT/ML Pen Generic drug: Insulin Detemir INJECT 15 UNITS INTO THE SKIN DAILY AT 10 PM.   losartan 50 MG tablet Commonly known as: COZAAR Take 1 tablet (50 mg total) by mouth daily.   metFORMIN 1000 MG tablet Commonly known as: GLUCOPHAGE TAKE 1 TABLET BY MOUTH 2 TIMES DAILY WITH A MEAL.   methimazole 5 MG tablet Commonly known as: TAPAZOLE 1 tablet alternating with half   omeprazole 40 MG capsule Commonly known as: PRILOSEC TAKE 1 CAPSULE BY MOUTH EVERY DAY   OneTouch Delica Lancets 99991111 Misc 1 application by Does not apply route 2 (two) times daily. Use to check blood sugar up to two times daily. E11.69   OneTouch Verio test strip Generic drug: glucose blood USE AS INSTRUCTED   traMADol 50 MG tablet Commonly known as: ULTRAM TAKE 1 TABLET BY MOUTH EVERY 6 HOURS AS NEEDED FOR SPINAL STENOSIS PAIN           Past Medical History:  Diagnosis Date  . Actinic keratoses    follows with dermatology   . Arthritis   . Asthma   . Chronic back pain    DDD,herniated disc/spondylosis/radiculopathy  . COPD (chronic obstructive pulmonary disease) ([place])   .  Diabetes mellitus    1980s  . Early cataracts, bilateral    Dr. Ruthine Dose - [facility]  . GERD (gastroesophageal reflux disease)    takes Omeprazole daily  . Hemorrhoids   .  History of colonic polyps    colonoscopy 04/2012 - Dr Candace Cruise  . Hyperlipidemia    takes Pravastatin daily  . Hypertension    takes Metoprolol/HCTZ daily  . Hyperthyroidism   . Insomnia   . Joint pain    fingers  . Joint swelling   . Pneumonia    x 2 ;in Dec 2010/2011  . PONV (postoperative nausea and vomiting)   . Right leg weakness     Past Surgical History:  Procedure Laterality Date  . ABDOMINAL HYSTERECTOMY     at age 24  . BACK SURGERY  20+yrs ago  . CATARACT EXTRACTION W/PHACO Left 01/04/2019   Procedure: CATARACT EXTRACTION PHACO AND INTRAOCULAR LENS PLACEMENT ([place])  LEFT;  Surgeon: Leandrew Koyanagi, MD;  Location: [place];  Service: Ophthalmology;  Laterality: Left;  GIVE IV ZOFRAN  . CATARACT EXTRACTION W/PHACO Right 02/01/2019   Procedure: CATARACT EXTRACTION PHACO AND INTRAOCULAR LENS PLACEMENT ([place])  RIGHT DIABETIC;  Surgeon: Leandrew Koyanagi, MD;  Location: [place];  Service: Ophthalmology;  Laterality: Right;  DIABETIC GIVE IV ZOFRAN  . COLONOSCOPY    . COLONOSCOPY N/A 05/13/2015   Procedure: COLONOSCOPY;  Surgeon: Hulen Luster, MD;  Location: [facility] ENDOSCOPY;  Service: Gastroenterology;  Laterality: N/A;  . ESOPHAGOGASTRODUODENOSCOPY  2011  . ESOPHAGOGASTRODUODENOSCOPY (EGD) WITH PROPOFOL N/A 09/02/2017   Procedure: ESOPHAGOGASTRODUODENOSCOPY (EGD) WITH PROPOFOL;  Surgeon: Virgel Manifold, MD;  Location: [facility] ENDOSCOPY;  Service: Endoscopy;  Laterality: N/A;  . FOOT SURGERY  2011   bunionectomy and knot removed from bottom of both feet  . JOINT REPLACEMENT Left 2014   hip  Kraskinski  . LUMBAR LAMINECTOMY/DECOMPRESSION MICRODISCECTOMY  09/10/2011   Procedure: LUMBAR LAMINECTOMY/DECOMPRESSION MICRODISCECTOMY 1 LEVEL;  Surgeon: Hosie Spangle, MD;   Location: [place] [facility];  Service: Neurosurgery;  Laterality: Right;  RIGHT Lumbar  Laminotomy and microdiskectomy Lumbar Four-Five  . LUMBAR LAMINECTOMY/DECOMPRESSION MICRODISCECTOMY N/A 06/26/2015   Procedure: LUMBAR LAMINECTOMY/DECOMPRESSION MICRODISCECTOMY 1 LEVEL;  Surgeon: Jovita Gamma, MD;  Location: [place] NEUR[facility];  Service: Neurosurgery;  Laterality: N/A;  L3 and L4 Laminectomies  . MELANOMA EXCISION  2011   on nose/back on neck  . THORACIC DISC SURGERY  at age 33 and 52    mass of veins that had gotten into muscle and wrapped around rib;3 ribs also removed from left side  . TONSILLECTOMY  as a child   and adenoids   . TOTAL HIP ARTHROPLASTY Left 2014   Dr. Mack Guise  . TUBAL LIGATION    . VAGINA SURGERY     vaginal wall ruptured     Family History  Problem Relation Age of Onset  . Heart disease Brother        valve replacement  . COPD Brother   . COPD Mother        Emphysema  . Diabetes Mother   . Other Mother        Died in house fire at age 51.  Marland Kitchen Heart disease Father   . Seizures Father   . Cancer Father        Lung cancer with brain metastasis  . Asthma Brother   . COPD Son 66  . Cancer Other        aunt ? maternal vs paternal side lung cancer   . Diabetes Maternal Grandmother   . Anesthesia problems Neg Hx   . Hypotension Neg Hx   . Malignant hyperthermia Neg Hx   . Pseudochol deficiency Neg Hx   .  Thyroid disease Neg Hx   . Breast cancer Neg Hx     Social History:  reports that she quit smoking about 3 years ago. Her smoking use included cigarettes. She has a 5.00 pack-year smoking history. She has never used smokeless tobacco. She reports that she does not drink alcohol or use drugs.  Allergies:  Allergies  Allergen Reactions  . Vicodin [Hydrocodone-Acetaminophen] Other (See Comments)    Insomnia      Review of Systems  She is on basal insulin for diabetes and followed by PCP A1c as follows It is higher recently because of her not following  her diet, she continues to be managed by her PCP without change in her treatment regimen   Lab Results  Component Value Date   HGBA1C 7.8 (H) 08/30/2019   HGBA1C 7.4 (H) 04/14/2019   HGBA1C 6.9 (H) 10/26/2018   Lab Results  Component Value Date   MICROALBUR 0.7 04/14/2019   LDLCALC 45 04/14/2019   CREATININE 0.98 08/30/2019      Examination:   There were no vitals taken for this visit.  No exam done, patient is remote   Assessment/Plan:   Hyperthyroidism, diagnosed to be from Graves' disease, on antithyroid drug treatment since 09/2017 No previous history of goiter  She is on  methimazole consistently for her recurrent hyperthyroidism since 02/2019 Now taking 5 mg alternating with 2.5 mg  She is doing well subjectively No recent changes in her symptoms and her weight has fluctuated based on her diet  Her thyroid levels indicate TSH to be rising despite reducing her dose slightly on the last visit, now it is 3.0 Thyrotropin receptor antibody is more recently upper normal and not high as before  She may be able to stop her methimazole soon with the recent trend  She will now reduce her methimazole to 2.5 mg daily only and follow-up in 6 weeks  Duration of telephone encounter =5 minutes  Elayne Snare 09/04/2019, 1:10 PM    Note: This office note was prepared with Dragon voice recognition system technology. Any transcriptional errors that result from this process are unintentional.

## 2019-09-04 NOTE — Progress Notes (Addendum)
Subjective:   Maria Hinton is a 74 y.o. female who presents for Medicare Annual (Subsequent) preventive examination.  Review of Systems:  No ROS.  Medicare Wellness Virtual Visit.  Visual/audio telehealth visit, UTA vital signs.   Ht/Wt provided. See social history for additional risk factors.   Cardiac Risk Factors include: advanced age (>25men, >4 women);diabetes mellitus;hypertension     Objective:     Vitals: Ht 5\' 4"  (1.626 m)   Wt 154 lb (69.9 kg)   BMI 26.43 kg/m   Body mass index is 26.43 kg/m.  Advanced Directives 09/04/2019 02/01/2019 01/04/2019 09/02/2018 02/17/2018 09/02/2017 01/30/2016  Does Patient Have a Medical Advance Directive? Yes Yes Yes Yes No Yes Yes  Type of Paramedic of [place];Living will Living will;Healthcare Power of [place];Living will [place];Living will - [place];Living will [place];Living will  Does patient want to make changes to medical advance directive? No - Patient declined No - Patient declined No - Patient declined No - Patient declined - - -  Copy of [place] in Chart? No - copy requested No - copy requested No - copy requested No - copy requested - - No - copy requested  Pre-existing out of facility DNR order (yellow form or pink MOST form) - - - - - - -    Tobacco Social History   Tobacco Use  Smoking Status Former Smoker  . Packs/day: 0.25  . Years: 20.00  . Pack years: 5.00  . Types: Cigarettes  . Quit date: 2018  . Years since quitting: 3.1  Smokeless Tobacco Never Used  Tobacco Comment   Pt quit 11/08/2014.Pt had quit smoking for 13 years before starting back in 2010; per 05/2017 visit quit smoking 2017 smoked total 25 years 1 ppd      Counseling given: Not Answered Comment: Pt quit 11/08/2014.Pt had quit smoking for 13 years before starting back in 2010; per 05/2017 visit quit smoking 2017  smoked total 25 years 1 ppd    Clinical Intake:  Pre-visit preparation completed: Yes        Diabetes: Yes(Followed by pcp)  How often do you need to have someone help you when you read instructions, pamphlets, or other written materials from your doctor or pharmacy?: 1 - Never  Interpreter Needed?: No     Past Medical History:  Diagnosis Date  . Actinic keratoses    follows with dermatology   . Arthritis   . Asthma   . Chronic back pain    DDD,herniated disc/spondylosis/radiculopathy  . COPD (chronic obstructive pulmonary disease) ([place])   . Diabetes mellitus    1980s  . Early cataracts, bilateral    Dr. Ruthine Dose - [facility]  . GERD (gastroesophageal reflux disease)    takes Omeprazole daily  . Hemorrhoids   . History of colonic polyps    colonoscopy 04/2012 - Dr Candace Cruise  . Hyperlipidemia    takes Pravastatin daily  . Hypertension    takes Metoprolol/HCTZ daily  . Hyperthyroidism   . Insomnia   . Joint pain    fingers  . Joint swelling   . Pneumonia    x 2 ;in Dec 2010/2011  . PONV (postoperative nausea and vomiting)   . Right leg weakness    Past Surgical History:  Procedure Laterality Date  . ABDOMINAL HYSTERECTOMY     at age 66  . BACK SURGERY  20+yrs ago  .  CATARACT EXTRACTION W/PHACO Left 01/04/2019   Procedure: CATARACT EXTRACTION PHACO AND INTRAOCULAR LENS PLACEMENT ([place])  LEFT;  Surgeon: Leandrew Koyanagi, MD;  Location: [place];  Service: Ophthalmology;  Laterality: Left;  GIVE IV ZOFRAN  . CATARACT EXTRACTION W/PHACO Right 02/01/2019   Procedure: CATARACT EXTRACTION PHACO AND INTRAOCULAR LENS PLACEMENT ([place])  RIGHT DIABETIC;  Surgeon: Leandrew Koyanagi, MD;  Location: [place];  Service: Ophthalmology;  Laterality: Right;  DIABETIC GIVE IV ZOFRAN  . COLONOSCOPY    . COLONOSCOPY N/A 05/13/2015   Procedure: COLONOSCOPY;  Surgeon: Hulen Luster, MD;  Location: [facility] ENDOSCOPY;  Service: Gastroenterology;  Laterality:  N/A;  . ESOPHAGOGASTRODUODENOSCOPY  2011  . ESOPHAGOGASTRODUODENOSCOPY (EGD) WITH PROPOFOL N/A 09/02/2017   Procedure: ESOPHAGOGASTRODUODENOSCOPY (EGD) WITH PROPOFOL;  Surgeon: Virgel Manifold, MD;  Location: [facility] ENDOSCOPY;  Service: Endoscopy;  Laterality: N/A;  . FOOT SURGERY  2011   bunionectomy and knot removed from bottom of both feet  . JOINT REPLACEMENT Left 2014   hip  Kraskinski  . LUMBAR LAMINECTOMY/DECOMPRESSION MICRODISCECTOMY  09/10/2011   Procedure: LUMBAR LAMINECTOMY/DECOMPRESSION MICRODISCECTOMY 1 LEVEL;  Surgeon: Hosie Spangle, MD;  Location: [place] NEURO ORS;  Service: Neurosurgery;  Laterality: Right;  RIGHT Lumbar  Laminotomy and microdiskectomy Lumbar Four-Five  . LUMBAR LAMINECTOMY/DECOMPRESSION MICRODISCECTOMY N/A 06/26/2015   Procedure: LUMBAR LAMINECTOMY/DECOMPRESSION MICRODISCECTOMY 1 LEVEL;  Surgeon: Jovita Gamma, MD;  Location: [place] NEURO ORS;  Service: Neurosurgery;  Laterality: N/A;  L3 and L4 Laminectomies  . MELANOMA EXCISION  2011   on nose/back on neck  . THORACIC DISC SURGERY  at age 45 and 60    mass of veins that had gotten into muscle and wrapped around rib;3 ribs also removed from left side  . TONSILLECTOMY  as a child   and adenoids   . TOTAL HIP ARTHROPLASTY Left 2014   Dr. Mack Guise  . TUBAL LIGATION    . VAGINA SURGERY     vaginal wall ruptured    Family History  Problem Relation Age of Onset  . Heart disease Brother        valve replacement  . COPD Brother   . COPD Mother        Emphysema  . Diabetes Mother   . Other Mother        Died in house fire at age 60.  Marland Kitchen Heart disease Father   . Seizures Father   . Cancer Father        Lung cancer with brain metastasis  . Asthma Brother   . COPD Son 13  . Cancer Other        aunt ? maternal vs paternal side lung cancer   . Diabetes Maternal Grandmother   . Anesthesia problems Neg Hx   . Hypotension Neg Hx   . Malignant hyperthermia Neg Hx   . Pseudochol deficiency Neg Hx   .  Thyroid disease Neg Hx   . Breast cancer Neg Hx    Social History   Socioeconomic History  . Marital status: Widowed    Spouse name: Not on file  . Number of children: 2  . Years of education: 18  . Highest education level: High school graduate  Occupational History  . Occupation: Retired  Tobacco Use  . Smoking status: Former Smoker    Packs/day: 0.25    Years: 20.00    Pack years: 5.00    Types: Cigarettes    Quit date: 2018    Years since quitting: 3.1  . Smokeless  tobacco: Never Used  . Tobacco comment: Pt quit 11/08/2014.Pt had quit smoking for 13 years before starting back in 2010; per 05/2017 visit quit smoking 2017 smoked total 25 years 1 ppd   Substance and Sexual Activity  . Alcohol use: No    Alcohol/week: 0.0 standard drinks  . Drug use: No  . Sexual activity: Never    Birth control/protection: Surgical  Other Topics Concern  . Not on file  Social History Narrative   Widowed.    Lives alone.   Right-handed.    2 cups caffeine per day.   Social Determinants of Health   Financial Resource Strain: Low Risk   . Difficulty of Paying Living Expenses: Not hard at all  Food Insecurity: No Food Insecurity  . Worried About Charity fundraiser in the Last Year: Never true  . Ran Out of Food in the Last Year: Never true  Transportation Needs: No Transportation Needs  . Lack of Transportation (Medical): No  . Lack of Transportation (Non-Medical): No  Physical Activity:   . Days of Exercise per Week: Not on file  . Minutes of Exercise per Session: Not on file  Stress: No Stress Concern Present  . Feeling of Stress : Not at all  Social Connections: Unknown  . Frequency of Communication with Friends and Family: More than three times a week  . Frequency of Social Gatherings with Friends and Family: Once a week  . Attends Religious Services: Not on file  . Active Member of Clubs or Organizations: Yes  . Attends Archivist Meetings: Not on file  . Marital  Status: Widowed    Outpatient Encounter Medications as of 09/04/2019  Medication Sig  . albuterol (PROVENTIL HFA;VENTOLIN HFA) 108 (90 Base) MCG/ACT inhaler Inhale 2 puffs into the lungs every 6 (six) hours as needed for wheezing.  Marland Kitchen ALPRAZolam (XANAX) 0.25 MG tablet TAKE 1 TABLET BY MOUTH TWICE A DAY AS NEEDED  . aspirin EC 81 MG tablet Take 81 mg by mouth daily.  Marland Kitchen atenolol (TENORMIN) 50 MG tablet TAKE 1 TABLET BY MOUTH TWICE A DAY  . atorvastatin (LIPITOR) 40 MG tablet TAKE 1 TABLET BY MOUTH DAILY AT 6 PM.  . gabapentin (NEURONTIN) 100 MG capsule Take 1 capsule (100 mg total) by mouth 3 (three) times daily.  . hydrochlorothiazide (HYDRODIURIL) 12.5 MG tablet TAKE 1 TABLET (12.5 MG TOTAL) BY MOUTH DAILY.  Marland Kitchen Insulin Pen Needle (BD PEN NEEDLE NANO U/F) 32G X 4 MM MISC USE DAILY AS DIRECTED WITH LEVEMIR  . LEVEMIR FLEXTOUCH 100 UNIT/ML Pen INJECT 15 UNITS INTO THE SKIN DAILY AT 10 PM.  . losartan (COZAAR) 50 MG tablet Take 1 tablet (50 mg total) by mouth daily.  . metFORMIN (GLUCOPHAGE) 1000 MG tablet TAKE 1 TABLET BY MOUTH 2 TIMES DAILY WITH A MEAL.  . methimazole (TAPAZOLE) 5 MG tablet 1 tablet alternating with half  . omeprazole (PRILOSEC) 40 MG capsule TAKE 1 CAPSULE BY MOUTH EVERY DAY  . OneTouch Delica Lancets 99991111 MISC 1 application by Does not apply route 2 (two) times daily. Use to check blood sugar up to two times daily. E11.69  . ONETOUCH VERIO test strip USE AS INSTRUCTED  . traMADol (ULTRAM) 50 MG tablet TAKE 1 TABLET BY MOUTH EVERY 6 HOURS AS NEEDED FOR SPINAL STENOSIS PAIN   No facility-administered encounter medications on file as of 09/04/2019.    Activities of Daily Living In your present state of health, do you have any difficulty performing  the following activities: 09/04/2019 02/01/2019  Hearing? N N  Vision? N N  Difficulty concentrating or making decisions? N N  Walking or climbing stairs? Y N  Dressing or bathing? N N  Doing errands, shopping? N -  Preparing Food and  eating ? N -  Using the Toilet? N -  In the past six months, have you accidently leaked urine? N -  Do you have problems with loss of bowel control? N -  Managing your Medications? N -  Managing your Finances? N -  Housekeeping or managing your Housekeeping? N -  Some recent data might be hidden    Patient Care Team: Crecencio Mc, MD as PCP - General (Internal Medicine) Jovita Gamma, MD as Consulting Physician (Neurosurgery)    Assessment:   This is a routine wellness examination for Ke[place].  Nurse connected with patient 09/04/19 at  9:30 AM EST by a telephone enabled telemedicine application and verified that I am speaking with the correct person using two identifiers. Patient stated full name and DOB. Patient gave permission to continue with virtual visit. Patient's location was at home and Nurse's location was at [place] office.   Patient is alert and oriented x3. Patient denies difficulty focusing or concentrating. Patient likes to read for brain stimulation.   Health Maintenance Due: -Foot Exam- 05/02/19 -Hgb A1c- 08/30/19(7.8) See completed HM at the end of note.   Eye: Visual acuity not assessed. Virtual visit. Followed by their ophthalmologist.  Retinopathy- none reported.  Dental: Visits every 6 months.    Hearing: Demonstrates normal hearing during visit.  Safety:  Patient feels safe at home- yes Patient does have smoke detectors at home- yes Patient does wear sunscreen or protective clothing when in direct sunlight - yes Patient does wear seat belt when in a moving vehicle - yes Patient drives- yes Adequate lighting in walkways free from debris- yes Grab bars and handrails used as appropriate- yes Ambulates with an assistive device- yes; cane as needed. Cell phone on person when ambulating outside of the home- yes  Social: Alcohol intake - no      Smoking history- former   Smokers in home? none Illicit drug use? none  Medication: Taking as  directed and without issues.  Pill box in use -yes  Self managed - yes   Covid-19: Precautions and sickness symptoms discussed. Wears mask, social distancing, hand hygiene as appropriate.   Activities of Daily Living Patient denies needing assistance with: household chores, feeding themselves, getting from bed to chair, getting to the toilet, bathing/showering, dressing, managing money, or preparing meals.   Discussed the importance of a healthy diet, water intake and the benefits of aerobic exercise.   Physical activity- active around the home.   Diet:  Regular Water: good intake; 6 cups  Caffeine: half/half coffee, Diet Dr. Malachi Bonds; mostly caffeine free Diet Coke.   Other Providers Patient Care Team: Crecencio Mc, MD as PCP - General (Internal Medicine) Jovita Gamma, MD as Consulting Physician (Neurosurgery)  Exercise Activities and Dietary recommendations Current Exercise Habits: The patient does not participate in regular exercise at present  Goals      Patient Stated   . DIET - INCREASE WATER INTAKE (pt-stated)     Stay hydrated    . Increase physical activity (pt-stated)     Walk more for exercise    . Weight (lb) < 150 lb (68 kg) (pt-stated)       Fall Risk Fall Risk  09/04/2019 04/17/2019 09/02/2018  04/05/2017 04/05/2017  Falls in the past year? 0 1 1 Yes No  Number falls in past yr: - 1 1 2  or more -  Comment - - States she did not lift her feet appropriately when walking=fall 1.  States her legs were weak at home =fall2. No injury.  - -  Injury with Fall? - 0 0 - -  Comment - - - - -  Risk for fall due to : - History of fall(s) - - -  Follow up Falls evaluation completed Falls evaluation completed - - -    Timed Get Up and Go performed: no, virtual visit  Depression Screen PHQ 2/9 Scores 09/04/2019 09/02/2018 04/11/2018 04/05/2017  PHQ - 2 Score 1 0 0 3  PHQ- 9 Score 4 - 4 14     Cognitive Function MMSE - Mini Mental State Exam 01/30/2016  Orientation  to time 5  Orientation to Place 5  Registration 3  Attention/ Calculation 5  Recall 3  Language- name 2 objects 2  Language- repeat 1  Language- follow 3 step command 3  Language- read & follow direction 1  Write a sentence 1  Copy design 1  Total score 30     6CIT Screen 09/04/2019 09/02/2018  What Year? 0 points 0 points  What month? 0 points 0 points  What time? 0 points 0 points  Count back from 20 0 points 0 points  Months in reverse 0 points 0 points  Repeat phrase 0 points 0 points  Total Score 0 0    Immunization History  Administered Date(s) Administered  . Influenza Split 09/11/2011  . Influenza, High Dose Seasonal PF 06/03/2016, 04/05/2017, 04/11/2018  . Influenza,inj,Quad PF,6+ Mos 04/27/2013, 09/17/2014, 04/12/2015  . PFIZER SARS-COV-2 Vaccination 09/01/2019  . Pneumococcal Conjugate-13 02/09/2014  . Pneumococcal Polysaccharide-23 09/11/2011, 02/25/2018  . Tdap 02/09/2014   Screening Tests Health Maintenance  Topic Date Due  . FOOT EXAM  09/07/2017  . INFLUENZA VACCINE  10/04/2019 (Originally 02/04/2019)  . OPHTHALMOLOGY EXAM  11/23/2019  . HEMOGLOBIN A1C  02/27/2020  . COLONOSCOPY  05/12/2020  . MAMMOGRAM  08/21/2020  . TETANUS/TDAP  02/10/2024  . DEXA SCAN  Completed  . Hepatitis C Screening  Completed  . PNA vac Low Risk Adult  Completed      Plan:   Keep all routine maintenance appointments.   Follow up with your doctor 09/06/19 @ 2:00  Medicare Attestation I have personally reviewed: The patient's medical and social history Their use of alcohol, tobacco or illicit drugs Their current medications and supplements The patient's functional ability including ADLs,fall risks, home safety risks, cognitive, and hearing and visual impairment Diet and physical activities Evidence for depression   I have reviewed and discussed with patient certain preventive protocols, quality metrics, and best practice recommendations.     OBrien-Blaney,  L,  LPN  [id number]     I have reviewed the above information and agree with above.   Deborra Medina, MD

## 2019-09-04 NOTE — Patient Instructions (Addendum)
  Ms. Grandin , Thank you for taking time to come for your Medicare Wellness Visit. I appreciate your ongoing commitment to your health goals. Please review the following plan we discussed and let me know if I can assist you in the future.   These are the goals we discussed: Goals      Patient Stated   . DIET - INCREASE WATER INTAKE (pt-stated)     Stay hydrated    . Increase physical activity (pt-stated)     Walk more for exercise    . Weight (lb) < 150 lb (68 kg) (pt-stated)       This is a list of the screening recommended for you and due dates:  Health Maintenance  Topic Date Due  . Complete foot exam   09/07/2017  . Flu Shot  10/04/2019*  . Eye exam for diabetics  11/23/2019  . Hemoglobin A1C  02/27/2020  . Colon Cancer Screening  05/12/2020  . Mammogram  08/21/2020  . Tetanus Vaccine  02/10/2024  . DEXA scan (bone density measurement)  Completed  .  Hepatitis C: One time screening is recommended by Center for Disease Control  (CDC) for  adults born from [birth year] through 1963.   Completed  . Pneumonia vaccines  Completed  *Topic was postponed. The date shown is not the original due date.

## 2019-09-05 ENCOUNTER — Other Ambulatory Visit: Payer: Self-pay

## 2019-09-06 ENCOUNTER — Other Ambulatory Visit: Payer: Self-pay

## 2019-09-06 ENCOUNTER — Ambulatory Visit: Payer: Medicare HMO

## 2019-09-06 ENCOUNTER — Ambulatory Visit (INDEPENDENT_AMBULATORY_CARE_PROVIDER_SITE_OTHER): Payer: Medicare HMO | Admitting: Internal Medicine

## 2019-09-06 ENCOUNTER — Encounter: Payer: Self-pay | Admitting: Internal Medicine

## 2019-09-06 DIAGNOSIS — Z794 Long term (current) use of insulin: Secondary | ICD-10-CM

## 2019-09-06 DIAGNOSIS — I951 Orthostatic hypotension: Secondary | ICD-10-CM | POA: Diagnosis not present

## 2019-09-06 DIAGNOSIS — E1140 Type 2 diabetes mellitus with diabetic neuropathy, unspecified: Secondary | ICD-10-CM | POA: Diagnosis not present

## 2019-09-06 DIAGNOSIS — F419 Anxiety disorder, unspecified: Secondary | ICD-10-CM

## 2019-09-06 DIAGNOSIS — F5105 Insomnia due to other mental disorder: Secondary | ICD-10-CM

## 2019-09-06 DIAGNOSIS — H8110 Benign paroxysmal vertigo, unspecified ear: Secondary | ICD-10-CM

## 2019-09-06 DIAGNOSIS — R748 Abnormal levels of other serum enzymes: Secondary | ICD-10-CM

## 2019-09-06 DIAGNOSIS — I10 Essential (primary) hypertension: Secondary | ICD-10-CM

## 2019-09-06 DIAGNOSIS — M4722 Other spondylosis with radiculopathy, cervical region: Secondary | ICD-10-CM

## 2019-09-06 NOTE — Progress Notes (Signed)
Subjective:  Patient ID: Maria Hinton, female    DOB: October 11, 1945  Age: 74 y.o. MRN: [id number]  CC: Diagnoses of Benign paroxysmal positional vertigo, unspecified laterality, Orthostasis, Type 2 diabetes mellitus with diabetic neuropathy, with long-term current use of insulin ([place]), Elevated liver enzymes, HTN (hypertension), benign, Insomnia secondary to anxiety, and Cervical spondylosis with radiculopathy were pertinent to this visit.  HPI Maria Hinton presents for follow up on type 2 DM,  GAD with insomnia, and hypertension   This visit occurred during the SARS-CoV-2 public health emergency.  Safety protocols were in place, including screening questions prior to the visit, additional usage of staff PPE, and extensive cleaning of exam room while observing appropriate contact time as indicated for disinfecting solutions.    1) insomnia:  Taking 0.5 mg alprazolam at night.  Goes to bed at 10:00 Still averaging 4 hrs per night , takes 3 hours to fall asleep and sleeps 4 hours , then remains wide awake  Til morning.  Denies alcohol use.  She has tried adding  Nyquil Z for a few nights,  Sleeps much longer. (till 11:00 am!).     2) Gets up in the morning and has vertigo  That lasts up to an hour.  Has recurrence during the day as well with sudden position change. Several months duration , happening daily, not associated with a headache or vision changes.  No recent sinus infection .  No unilateral weakness .   3)  Recurrence of sharp pain  localized to the occipital part of left scalp, that occur if she turns her head quickly (without thinking) pain is described as a lightening bolt flash that recedes once it hits. . Has neck pain in the morning  . Sleeps on a soft pillow with no neck support.   3) orthostatic  Systolic pressure  [id number] to 102  pulsle increases.    C/o dizziness with standing up    4) T2DM:  She  feels generally well,  But is not  exercising regularly or trying to lose weight.  Checking  blood sugars less than once daily at variable times, usually only if she feels she may be having a hypoglycemic event. .  BS have been ranging from 199 to 169   Denies any recent hypoglyemic events.  Taking   medications as directed. Following a carbohydrate modified diet 6 days per week. Denies numbness, burning and tingling of extremities. Appetite is good.  Lab Results  Component Value Date   HGBA1C 7.8 (H) 08/30/2019     Outpatient Medications Prior to Visit  Medication Sig Dispense Refill  . albuterol (PROVENTIL HFA;VENTOLIN HFA) 108 (90 Base) MCG/ACT inhaler Inhale 2 puffs into the lungs every 6 (six) hours as needed for wheezing. 6.7 g 11  . ALPRAZolam (XANAX) 0.25 MG tablet TAKE 1 TABLET BY MOUTH TWICE A DAY AS NEEDED 60 tablet 2  . aspirin EC 81 MG tablet Take 81 mg by mouth daily.    Marland Kitchen atenolol (TENORMIN) 50 MG tablet TAKE 1 TABLET BY MOUTH TWICE A DAY 180 tablet 1  . atorvastatin (LIPITOR) 40 MG tablet TAKE 1 TABLET BY MOUTH DAILY AT 6 PM. 90 tablet 3  . gabapentin (NEURONTIN) 100 MG capsule Take 1 capsule (100 mg total) by mouth 3 (three) times daily. 90 capsule 3  . hydrochlorothiazide (HYDRODIURIL) 12.5 MG tablet TAKE 1 TABLET (12.5 MG TOTAL) BY MOUTH DAILY. 90 tablet 3  . Insulin Pen Needle (BD PEN NEEDLE NANO U/F)  32G X 4 MM MISC USE DAILY AS DIRECTED WITH LEVEMIR 100 each 1  . LEVEMIR FLEXTOUCH 100 UNIT/ML Pen INJECT 15 UNITS INTO THE SKIN DAILY AT 10 PM. 15 mL 2  . losartan (COZAAR) 50 MG tablet Take 1 tablet (50 mg total) by mouth daily. 90 tablet 3  . metFORMIN (GLUCOPHAGE) 1000 MG tablet TAKE 1 TABLET BY MOUTH 2 TIMES DAILY WITH A MEAL. 180 tablet 1  . methimazole (TAPAZOLE) 5 MG tablet 1 tablet alternating with half (Patient taking differently: 1/2 tablet daily) 30 tablet 1  . omeprazole (PRILOSEC) 40 MG capsule TAKE 1 CAPSULE BY MOUTH EVERY DAY 90 capsule 3  . OneTouch Delica Lancets 99991111 MISC 1 application by Does not apply route 2 (two) times daily. Use to  check blood sugar up to two times daily. E11.69 100 each 11  . ONETOUCH VERIO test strip USE AS INSTRUCTED 100 strip 12  . traMADol (ULTRAM) 50 MG tablet TAKE 1 TABLET BY MOUTH EVERY 6 HOURS AS NEEDED FOR SPINAL STENOSIS PAIN 120 tablet 2   No facility-administered medications prior to visit.    Review of Systems;  Patient denies headache, fevers, malaise, unintentional weight loss, skin rash, eye pain, sinus congestion and sinus pain, sore throat, dysphagia,  hemoptysis , cough, dyspnea, wheezing, chest pain, palpitations, orthopnea, edema, abdominal pain, nausea, melena, diarrhea, constipation, flank pain, dysuria, hematuria, urinary  Frequency, nocturia, numbness, tingling, seizures,  Focal weakness, Loss of consciousness,  Tremor, insomnia, depression, anxiety, and suicidal ideation.      Objective:  BP 138/70 (BP Location: Left Arm, Patient Position: Sitting, Cuff Size: Normal)   Pulse 68   Temp (!) 96 F (35.6 C) (Temporal)   Resp 15   Ht 5\' 4"  (1.626 m)   Wt 158 lb 12.8 oz (72 kg)   SpO2 97%   BMI 27.26 kg/m   BP Readings from Last 3 Encounters:  09/06/19 138/70  04/17/19 130/68  02/01/19 124/67    Wt Readings from Last 3 Encounters:  09/06/19 158 lb 12.8 oz (72 kg)  09/04/19 154 lb (69.9 kg)  04/17/19 154 lb 6.4 oz (70 kg)    General appearance: alert, cooperative and appears stated age Ears: normal TM's and external ear canals both ears Throat: lips, mucosa, and tongue normal; teeth and gums normal Neck: no adenopathy, no carotid bruit, supple, symmetrical, trachea midline and thyroid not enlarged, symmetric, no tenderness/mass/nodules Back: symmetric, no curvature. ROM normal. No CVA tenderness. Lungs: clear to auscultation bilaterally Heart: regular rate and rhythm, S1, S2 normal, no murmur, click, rub or gallop Abdomen: soft, non-tender; bowel sounds normal; no masses,  no organomegaly Pulses: 2+ and symmetric Skin: Skin color, texture, turgor normal. No  rashes or lesions Lymph nodes: Cervical, supraclavicular, and axillary nodes normal.  Lab Results  Component Value Date   HGBA1C 7.8 (H) 08/30/2019   HGBA1C 7.4 (H) 04/14/2019   HGBA1C 6.9 (H) 10/26/2018    Lab Results  Component Value Date   CREATININE 0.98 08/30/2019   CREATININE 0.87 04/14/2019   CREATININE 0.80 10/26/2018    Lab Results  Component Value Date   WBC 6.4 04/21/2018   HGB 14.5 04/21/2018   HCT 42.7 04/21/2018   PLT 324.0 04/21/2018   GLUCOSE 131 (H) 08/30/2019   CHOL 114 04/14/2019   TRIG 132.0 04/14/2019   HDL 42.90 04/14/2019   LDLDIRECT 81.0 07/07/2017   LDLCALC 45 04/14/2019   ALT 10 08/30/2019   AST 11 08/30/2019   NA 135 08/30/2019  K 3.5 08/30/2019   CL 97 08/30/2019   CREATININE 0.98 08/30/2019   BUN 19 08/30/2019   CO2 30 08/30/2019   TSH 3.04 08/30/2019   INR 0.88 02/17/2018   HGBA1C 7.8 (H) 08/30/2019   MICROALBUR 0.7 04/14/2019    MM 3D SCREEN BREAST BILATERAL  Result Date: 08/22/2019 CLINICAL DATA:  Screening. EXAM: DIGITAL SCREENING BILATERAL MAMMOGRAM WITH TOMO AND CAD COMPARISON:  Previous exam(s). ACR Breast Density Category b: There are scattered areas of fibroglandular density. FINDINGS: There are no findings suspicious for malignancy. Images were processed with CAD. IMPRESSION: No mammographic evidence of malignancy. A result letter of this screening mammogram will be mailed directly to the patient. RECOMMENDATION: Screening mammogram in one year. (Code:SM-B-01Y) BI-RADS CATEGORY  1: Negative. Electronically Signed   By: Ammie Ferrier M.D.   On: 08/22/2019 16:11    Assessment & Plan:   Problem List Items Addressed This Visit      Unprioritized   HTN (hypertension), benign    Repeat assessment of BP needed after dc hctz which was causing orthostasis       Insomnia secondary to anxiety    Taking alprazolam 0.5 mg.  Advised to add melatonin at dinner time.       Elevated liver enzymes    Resolved with management of  hyperthyroid state .  Repeat assessment  Normal    Lab Results  Component Value Date   ALT 10 08/30/2019   AST 11 08/30/2019   ALKPHOS 107 08/30/2019   BILITOT 0.4 08/30/2019         Type 2 diabetes mellitus with diabetic neuropathy, unspecified (HCC)    Loss of control. Advised to increase lantus to 12 units.  Diet reviewed in detail.  rtc one month  Lab Results  Component Value Date   HGBA1C 7.8 (H) 08/30/2019   Lab Results  Component Value Date   MICROALBUR 0.7 04/14/2019   MICROALBUR <0.7 10/26/2018            Benign positional vertigo    Appears to be occurring in the setting of orthostasis demonstrated on today's examination of positional blood pressures.   Advised to dc hctz and increase her water intake       Orthostasis    Dc hctz and increase water intake       Cervical spondylosis with radiculopathy    Symptoms are intermittent.  MRI reviewed with patient. Recommend neck support at night.        A total of 40 minutes was spent with patient more than half of which was spent in counseling patient on the above mentioned issues , reviewing and explaining recent labs and imaging studies done, and coordination of care.  I am having Rhyleigh F. Fjeld maintain her aspirin EC, albuterol, losartan, Levemir FlexTouch, omeprazole, BD Pen Needle Nano U/F, metFORMIN, atenolol, hydrochlorothiazide, OneTouch Verio, OneTouch Delica Lancets 99991111, gabapentin, traMADol, methimazole, atorvastatin, and ALPRAZolam.  No orders of the defined types were placed in this encounter.   There are no discontinued medications.  Follow-up: Return in about 4 weeks (around 10/04/2019).   Crecencio Mc, MD

## 2019-09-06 NOTE — Patient Instructions (Addendum)
  Increase your levemir to 12 units   continue checking once daily but vary the times   Fasting 2 hrs post prandial   Return in one month and bring your readings  so I can adjust dose of insulin   Avoid on a regular basis:   watermelon pineapple Ripe bananas Honey dew   For your insomnia  Reduce alprazolam dose to 0.25 mg ( one tablet) because this is addictive  Starting taking melatonin 5 mg at dinner time.  If still wide awake at 11 pm,  You can  take 1/2 dose of nyquil Z  (tylenol PM also a choice)   Stop the hctz.   It is dehydrating you.   Increase water intake to include 2 16 ounce servings daily

## 2019-09-08 DIAGNOSIS — M4722 Other spondylosis with radiculopathy, cervical region: Secondary | ICD-10-CM | POA: Insufficient documentation

## 2019-09-08 DIAGNOSIS — H8110 Benign paroxysmal vertigo, unspecified ear: Secondary | ICD-10-CM | POA: Insufficient documentation

## 2019-09-08 DIAGNOSIS — I951 Orthostatic hypotension: Secondary | ICD-10-CM | POA: Insufficient documentation

## 2019-09-08 NOTE — Assessment & Plan Note (Signed)
Dc hctz and increase water intake

## 2019-09-08 NOTE — Assessment & Plan Note (Signed)
Repeat assessment of BP needed after dc hctz which was causing orthostasis

## 2019-09-08 NOTE — Assessment & Plan Note (Addendum)
Loss of control. Advised to increase lantus to 12 units.  Diet reviewed in detail.  rtc one month  Lab Results  Component Value Date   HGBA1C 7.8 (H) 08/30/2019   Lab Results  Component Value Date   MICROALBUR 0.7 04/14/2019   MICROALBUR <0.7 10/26/2018

## 2019-09-08 NOTE — Assessment & Plan Note (Signed)
Resolved with management of hyperthyroid state .  Repeat assessment  Normal    Lab Results  Component Value Date   ALT 10 08/30/2019   AST 11 08/30/2019   ALKPHOS 107 08/30/2019   BILITOT 0.4 08/30/2019

## 2019-09-08 NOTE — Assessment & Plan Note (Signed)
Taking alprazolam 0.5 mg.  Advised to add melatonin at dinner time.

## 2019-09-08 NOTE — Assessment & Plan Note (Signed)
Appears to be occurring in the setting of orthostasis demonstrated on today's examination of positional blood pressures.   Advised to dc hctz and increase her water intake

## 2019-09-08 NOTE — Assessment & Plan Note (Addendum)
Symptoms are intermittent.  MRI reviewed with patient. Recommend neck support at night.

## 2019-09-18 ENCOUNTER — Other Ambulatory Visit: Payer: Self-pay | Admitting: Internal Medicine

## 2019-09-20 ENCOUNTER — Other Ambulatory Visit: Payer: Self-pay | Admitting: Internal Medicine

## 2019-09-26 ENCOUNTER — Ambulatory Visit: Payer: Medicare HMO | Attending: Internal Medicine

## 2019-09-26 DIAGNOSIS — Z23 Encounter for immunization: Secondary | ICD-10-CM

## 2019-09-26 NOTE — Progress Notes (Signed)
   Covid-19 Vaccination [facility]  Name:  Maria Hinton    MRN: [id number] DOB: 1945-07-24  09/26/2019  Maria Hinton was observed post Covid-19 immunization for 15 minutes without incident. She was provided with Vaccine Information Sheet and instruction to access the V-Safe system.   Maria Hinton was instructed to call 911 with any severe reactions post vaccine: Marland Kitchen Difficulty breathing  . Swelling of face and throat  . A fast heartbeat  . A bad rash all over body  . Dizziness and weakness   Immunizations Administered    Name Date Dose VIS Date Route   Pfizer COVID-19 Vaccine 09/26/2019  4:11 PM 0.3 mL 06/16/2019 Intramuscular   Manufacturer: [employer]   Lot: [id number]   [place]: [id number]

## 2019-10-02 ENCOUNTER — Other Ambulatory Visit: Payer: Self-pay | Admitting: Endocrinology

## 2019-10-05 ENCOUNTER — Other Ambulatory Visit: Payer: Self-pay

## 2019-10-10 ENCOUNTER — Other Ambulatory Visit: Payer: Self-pay

## 2019-10-10 ENCOUNTER — Encounter: Payer: Self-pay | Admitting: Internal Medicine

## 2019-10-10 ENCOUNTER — Ambulatory Visit: Payer: Medicare HMO | Admitting: Internal Medicine

## 2019-10-10 VITALS — BP 158/92 | HR 65 | Temp 97.8°F | Resp 15 | Ht 64.0 in | Wt 163.4 lb

## 2019-10-10 DIAGNOSIS — K209 Esophagitis, unspecified without bleeding: Secondary | ICD-10-CM

## 2019-10-10 DIAGNOSIS — M25541 Pain in joints of right hand: Secondary | ICD-10-CM

## 2019-10-10 DIAGNOSIS — H6123 Impacted cerumen, bilateral: Secondary | ICD-10-CM | POA: Diagnosis not present

## 2019-10-10 DIAGNOSIS — M25542 Pain in joints of left hand: Secondary | ICD-10-CM

## 2019-10-10 DIAGNOSIS — Z794 Long term (current) use of insulin: Secondary | ICD-10-CM

## 2019-10-10 DIAGNOSIS — E1140 Type 2 diabetes mellitus with diabetic neuropathy, unspecified: Secondary | ICD-10-CM

## 2019-10-10 DIAGNOSIS — I951 Orthostatic hypotension: Secondary | ICD-10-CM

## 2019-10-10 DIAGNOSIS — R42 Dizziness and giddiness: Secondary | ICD-10-CM

## 2019-10-10 MED ORDER — LOSARTAN POTASSIUM 100 MG PO TABS: 100.0000 mg | ORAL_TABLET | Freq: Every day | ORAL | 0 refills | Status: DC

## 2019-10-10 MED ORDER — MELOXICAM 15 MG PO TABS: 15.0000 mg | ORAL_TABLET | Freq: Every day | ORAL | 0 refills | Status: DC

## 2019-10-10 MED ORDER — GABAPENTIN 100 MG PO CAPS: 200.0000 mg | ORAL_CAPSULE | Freq: Three times a day (TID) | ORAL | 3 refills | Status: DC

## 2019-10-10 NOTE — Assessment & Plan Note (Signed)
Continue omeprazole daily 

## 2019-10-10 NOTE — Patient Instructions (Addendum)
Increase the levemir to 15 units daily and follow sugars.    increase the losartan to 100 mg daily in the evening .  Goal BP is > 140/80   ENT referral In progress    Remind lab tech tomorrow to draw both sets of labs (Kumar's and mine)  meloxicam once daily for joint pain   You can add up to 2000 mg of acetominophen (tylenol) every day safely  In divided doses (500 mg every 6 hours  Or 1000 mg every 12 hours.)

## 2019-10-10 NOTE — Assessment & Plan Note (Signed)
BP has improved with dc htcz but now high.  Increase losartan to 100 mg daily

## 2019-10-10 NOTE — Assessment & Plan Note (Signed)
PIP joints are swollen   checking for autoimmune disease.  meloxicam and tylenol

## 2019-10-10 NOTE — Assessment & Plan Note (Signed)
HAS ONLY BEEN TAKING 12 UNITS LEVEMIR , not 15 units. fastings not all < 130.  And PPDs up to 180.  Increase levemir to 15 units

## 2019-10-10 NOTE — Assessment & Plan Note (Signed)
Recurrent,  With position change, accompanied by feeling of fulness in ears and altered hearing .   Refer to ENT

## 2019-10-10 NOTE — Progress Notes (Signed)
Subjective:  Patient ID: Maria Hinton, female    DOB: October 29, 1945  Age: 74 y.o. MRN: [id number]  CC: The primary encounter diagnosis was Arthralgia of both hands. Diagnoses of Vertigo, Bilateral impacted cerumen, Type 2 diabetes mellitus with diabetic neuropathy, with long-term current use of insulin ([place]), Orthostasis, Esophagitis, and Joint pain in both hands were also pertinent to this visit.  HPI Maria Hinton presents for follow up  On  2 DM and hypertension with orthostasis with vertigo  This visit occurred during the SARS-CoV-2 public health emergency.  Safety protocols were in place, including screening questions prior to the visit, additional usage of staff PPE, and extensive cleaning of exam room while observing appropriate contact time as indicated for disinfecting solutions.    Patient has received both doses of the available COVID 19 vaccine without complications.  Patient continues to mask when outside of the home except when walking in yard or at safe distances from others .  Patient denies any change in mood or development of unhealthy behaviors resuting from the pandemic's restriction of activities and socialization.    Still having orthostasis after sitting for long periods of time and with rising in the morning.  Has not had any falls since stopping the hctz.  And is drinking  More water daily   DM: fastings run from 75 to 150  Most below 130.  PP's have been 130 to 180  Using voltaren gel for OA of hands,  PIP joints,  Swelling   Outpatient Medications Prior to Visit  Medication Sig Dispense Refill  . albuterol (PROVENTIL HFA;VENTOLIN HFA) 108 (90 Base) MCG/ACT inhaler Inhale 2 puffs into the lungs every 6 (six) hours as needed for wheezing. 6.7 g 11  . ALPRAZolam (XANAX) 0.25 MG tablet TAKE 1 TABLET BY MOUTH TWICE A DAY AS NEEDED 60 tablet 2  . aspirin EC 81 MG tablet Take 81 mg by mouth daily.    Marland Kitchen atenolol (TENORMIN) 50 MG tablet TAKE 1 TABLET BY MOUTH TWICE A DAY  180 tablet 1  . atorvastatin (LIPITOR) 40 MG tablet TAKE 1 TABLET BY MOUTH DAILY AT 6 PM. 90 tablet 3  . Insulin Pen Needle (BD PEN NEEDLE NANO U/F) 32G X 4 MM MISC USE DAILY AS DIRECTED WITH LEVEMIR 100 each 1  . LEVEMIR FLEXTOUCH 100 UNIT/ML Pen INJECT 15 UNITS INTO THE SKIN DAILY AT 10 PM. 15 mL 2  . metFORMIN (GLUCOPHAGE) 1000 MG tablet TAKE 1 TABLET BY MOUTH 2 TIMES DAILY WITH A MEAL. 180 tablet 1  . methimazole (TAPAZOLE) 5 MG tablet Tale 1/2 tablet by mouth once daily. 30 tablet 1  . omeprazole (PRILOSEC) 40 MG capsule TAKE 1 CAPSULE BY MOUTH EVERY DAY 90 capsule 3  . OneTouch Delica Lancets 99991111 MISC 1 application by Does not apply route 2 (two) times daily. Use to check blood sugar up to two times daily. E11.69 100 each 11  . ONETOUCH VERIO test strip USE AS INSTRUCTED 100 strip 12  . traMADol (ULTRAM) 50 MG tablet TAKE 1 TABLET BY MOUTH EVERY 6 HOURS AS NEEDED FOR SPINAL STENOSIS PAIN 120 tablet 2  . gabapentin (NEURONTIN) 100 MG capsule Take 1 capsule (100 mg total) by mouth 3 (three) times daily. 90 capsule 3  . losartan (COZAAR) 50 MG tablet TAKE 1 TABLET BY MOUTH EVERY DAY 90 tablet 3  . hydrochlorothiazide (HYDRODIURIL) 12.5 MG tablet TAKE 1 TABLET (12.5 MG TOTAL) BY MOUTH DAILY. (Patient not taking: Reported on 10/10/2019) 90  tablet 3   No facility-administered medications prior to visit.    Review of Systems;  Patient denies headache, fevers, malaise, unintentional weight loss, skin rash, eye pain, sinus congestion and sinus pain, sore throat, dysphagia,  hemoptysis , cough, dyspnea, wheezing, chest pain, palpitations, orthopnea, edema, abdominal pain, nausea, melena, diarrhea, constipation, flank pain, dysuria, hematuria, urinary  Frequency, nocturia, numbness, tingling, seizures,  Focal weakness, Loss of consciousness,  Tremor, insomnia, depression, anxiety, and suicidal ideation.      Objective:  BP (!) 158/92 (BP Location: Left Arm, Patient Position: Sitting, Cuff Size:  Normal)   Pulse 65   Temp 97.8 F (36.6 C) (Temporal)   Resp 15   Ht 5\' 4"  (1.626 m)   Wt 163 lb 6.4 oz (74.1 kg)   SpO2 97%   BMI 28.05 kg/m   BP Readings from Last 3 Encounters:  10/10/19 (!) 158/92  09/06/19 138/70  04/17/19 130/68    Wt Readings from Last 3 Encounters:  10/10/19 163 lb 6.4 oz (74.1 kg)  09/06/19 158 lb 12.8 oz (72 kg)  09/04/19 154 lb (69.9 kg)    General appearance: alert, cooperative and appears stated age Ears: normal TM's and external ear canals both ears Throat: lips, mucosa, and tongue normal; teeth and gums normal Neck: no adenopathy, no carotid bruit, supple, symmetrical, trachea midline and thyroid not enlarged, symmetric, no tenderness/mass/nodules Back: symmetric, no curvature. ROM normal. No CVA tenderness. Lungs: clear to auscultation bilaterally Heart: regular rate and rhythm, S1, S2 normal, no murmur, click, rub or gallop Abdomen: soft, non-tender; bowel sounds normal; no masses,  no organomegaly Pulses: 2+ and symmetric Ext:  Bilateral swelling of all PIP joints  Skin: Skin color, texture, turgor normal. No rashes or lesions Lymph nodes: Cervical, supraclavicular, and axillary nodes normal.  Lab Results  Component Value Date   HGBA1C 7.8 (H) 08/30/2019   HGBA1C 7.4 (H) 04/14/2019   HGBA1C 6.9 (H) 10/26/2018    Lab Results  Component Value Date   CREATININE 0.98 08/30/2019   CREATININE 0.87 04/14/2019   CREATININE 0.80 10/26/2018    Lab Results  Component Value Date   WBC 6.4 04/21/2018   HGB 14.5 04/21/2018   HCT 42.7 04/21/2018   PLT 324.0 04/21/2018   GLUCOSE 131 (H) 08/30/2019   CHOL 114 04/14/2019   TRIG 132.0 04/14/2019   HDL 42.90 04/14/2019   LDLDIRECT 81.0 07/07/2017   LDLCALC 45 04/14/2019   ALT 10 08/30/2019   AST 11 08/30/2019   NA 135 08/30/2019   K 3.5 08/30/2019   CL 97 08/30/2019   CREATININE 0.98 08/30/2019   BUN 19 08/30/2019   CO2 30 08/30/2019   TSH 3.04 08/30/2019   INR 0.88 02/17/2018    HGBA1C 7.8 (H) 08/30/2019   MICROALBUR 0.7 04/14/2019    MM 3D SCREEN BREAST BILATERAL  Result Date: 08/22/2019 CLINICAL DATA:  Screening. EXAM: DIGITAL SCREENING BILATERAL MAMMOGRAM WITH TOMO AND CAD COMPARISON:  Previous exam(s). ACR Breast Density Category b: There are scattered areas of fibroglandular density. FINDINGS: There are no findings suspicious for malignancy. Images were processed with CAD. IMPRESSION: No mammographic evidence of malignancy. A result letter of this screening mammogram will be mailed directly to the patient. RECOMMENDATION: Screening mammogram in one year. (Code:SM-B-01Y) BI-RADS CATEGORY  1: Negative. Electronically Signed   By: Ammie Ferrier M.D.   On: 08/22/2019 16:11    Assessment & Plan:   Problem List Items Addressed This Visit      Unprioritized  Esophagitis    Continue omeprazole daily       Type 2 diabetes mellitus with diabetic neuropathy, unspecified (HCC)    HAS ONLY BEEN TAKING 12 UNITS LEVEMIR , not 15 units. fastings not all < 130.  And PPDs up to 180.  Increase levemir to 15 units       Relevant Medications   losartan (COZAAR) 100 MG tablet   Orthostasis    BP has improved with dc htcz but now high.  Increase losartan to 100 mg daily       Relevant Medications   losartan (COZAAR) 100 MG tablet   Vertigo    Recurrent,  With position change, accompanied by feeling of fulness in ears and altered hearing .   Refer to ENT       Relevant Orders   Ambulatory referral to ENT   Joint pain in both hands    PIP joints are swollen   checking for autoimmune disease.  meloxicam and tylenol        Other Visit Diagnoses    Arthralgia of both hands    -  Primary   Relevant Orders   Sedimentation rate   C-reactive protein   ANA   Bilateral impacted cerumen       Relevant Orders   Ambulatory referral to ENT      I have discontinued Sivan F. Tolle's hydrochlorothiazide. I have also changed her gabapentin and losartan. Additionally,  I am having her start on meloxicam. Lastly, I am having her maintain her aspirin EC, albuterol, Levemir FlexTouch, omeprazole, BD Pen Needle Nano U/F, metFORMIN, OneTouch Verio, OneTouch Delica Lancets [id number], traMADol, atorvastatin, ALPRAZolam, atenolol, and methimazole.  Meds ordered this encounter  Medications  . gabapentin (NEURONTIN) 100 MG capsule    Sig: Take 2 capsules (200 mg total) by mouth 3 (three) times daily.    Dispense:  180 capsule    Refill:  3  . meloxicam (MOBIC) 15 MG tablet    Sig: Take 1 tablet (15 mg total) by mouth daily.    Dispense:  30 tablet    Refill:  0  . losartan (COZAAR) 100 MG tablet    Sig: Take 1 tablet (100 mg total) by mouth daily.    Dispense:  90 tablet    Refill:  0    KEEP ON FILE FOR FUTURE REFILLS NOTE DOSE CHANGE    Medications Discontinued During This Encounter  Medication Reason  . hydrochlorothiazide (HYDRODIURIL) 12.5 MG tablet Discontinued by provider  . gabapentin (NEURONTIN) 100 MG capsule   . losartan (COZAAR) 50 MG tablet    I provided  30 minutes of  face-to-face time during this encounter reviewing patient's current problems and past surgeries, labs and imaging studies, providing counseling on the above mentioned problems , and coordination  of care .  Follow-up: Return in about 3 months (around 01/09/2020) for follow up diabetes.   Crecencio Mc, MD

## 2019-10-11 ENCOUNTER — Other Ambulatory Visit (INDEPENDENT_AMBULATORY_CARE_PROVIDER_SITE_OTHER): Payer: Medicare HMO

## 2019-10-11 DIAGNOSIS — E0590 Thyrotoxicosis, unspecified without thyrotoxic crisis or storm: Secondary | ICD-10-CM | POA: Diagnosis not present

## 2019-10-11 DIAGNOSIS — M25541 Pain in joints of right hand: Secondary | ICD-10-CM | POA: Diagnosis not present

## 2019-10-11 DIAGNOSIS — M25542 Pain in joints of left hand: Secondary | ICD-10-CM | POA: Diagnosis not present

## 2019-10-11 LAB — C-REACTIVE PROTEIN: CRP: <1 mg/dL (ref 0.5–20.0)

## 2019-10-11 LAB — T3, FREE: T3, Free: 2.8 pg/mL (ref 2.3–4.2)

## 2019-10-11 LAB — T4, FREE: Free T4: 0.96 ng/dL (ref 0.60–1.60)

## 2019-10-11 LAB — TSH: TSH: 1.94 u[IU]/mL (ref 0.35–4.50)

## 2019-10-11 LAB — SEDIMENTATION RATE: Sed Rate: 7 mm/hr (ref 0–30)

## 2019-10-12 LAB — THYROTROPIN RECEPTOR AUTOABS: Thyrotropin Receptor Ab: 2.08 IU/L — ABNORMAL HIGH (ref 0.00–1.75)

## 2019-10-12 LAB — ANA: Anti Nuclear Antibody (ANA): NEGATIVE

## 2019-10-13 ENCOUNTER — Other Ambulatory Visit: Payer: Self-pay

## 2019-10-16 ENCOUNTER — Encounter: Payer: Self-pay | Admitting: Endocrinology

## 2019-10-16 ENCOUNTER — Ambulatory Visit: Payer: Medicare HMO | Admitting: Endocrinology

## 2019-10-16 ENCOUNTER — Other Ambulatory Visit: Payer: Self-pay

## 2019-10-16 VITALS — BP 140/74 | HR 70 | Ht 64.0 in | Wt 166.4 lb

## 2019-10-16 DIAGNOSIS — E0590 Thyrotoxicosis, unspecified without thyrotoxic crisis or storm: Secondary | ICD-10-CM

## 2019-10-16 NOTE — Progress Notes (Addendum)
Patient ID: Maria Hinton, female   DOB: Dec 30, 1945, 74 y.o.   MRN: [id number]                                                                                                              Reason for Appointment:  Hyperthyroidism, follow-up visit   Referring physician: Derrel Nip      History of Present Illness:   Prior history:  For several months before her consultation she had symptoms of weakness, palpitations, shakiness, feeling sweaty in am, nervousness, and fatigue.  Also would feel a strong heartbeat in her ears She had been on atenolol for hypertension also She had a decreased appetite and started losing weight late last summer  The patient had lost about  26 lbs since these symptoms started   RECENT history:  She started taking methimazole in 09/2017 for her hyperthyroidism On her 10/2017 visit she was taking 10 mg twice daily because of hyperthyroidism she was given 30 mg daily  In follow-up she has been requiring periodically reduced doses of her methimazole  However since late 2019 she appears to be needing progressively higher doses of methimazole  On her visit in 12/19 she was having fatigue and weakness; did not complain of feeling shaky or having palpitations, no weight loss However her thyroid levels were significantly high with 7.5 mg of methimazole The dose was increased  RECENT history:  In 4/20 she was having problems with feeling sleepy and tired and having less motivation her TSH with taking 15 mg methimazole was significantly higher at 14.5 and low normal free T4 Subsequently her methimazole has been reduced progressively and in July was taking only 5 mg daily using half of the 10 mg tablet  Her methimazole was stopped on 01/17/2019 However on her visit on 02/28/2019 she was having symptoms of feeling hot and sweaty, also a little more tired and weak but no palpitations or significant weight loss TSH is was then suppressed and her free T4 was relatively high at  1.3 and she was started back on methimazole  She is continuing to be taking methimazole, 2.5 mg since 3/21  No recent symptoms of fatigue, palpitations, heat or cold intolerance, anxiety  Her weight fluctuates and has gone up again   Her free T4 is about the same and TSH is now 1.95     Wt Readings from Last 3 Encounters:  10/16/19 166 lb 6.4 oz (75.5 kg)  10/10/19 163 lb 6.4 oz (74.1 kg)  09/06/19 158 lb 12.8 oz (72 kg)     Thyrotropin receptor antibody slightly above normal   Thyroid function tests as follows:     Lab Results  Component Value Date   FREET4 0.96 10/11/2019   FREET4 0.94 08/30/2019   FREET4 0.97 07/13/2019   T3FREE 2.8 10/11/2019   T3FREE 3.1 08/30/2019   T3FREE 2.6 07/13/2019   TSH 1.94 10/11/2019   TSH 3.04 08/30/2019   TSH 1.04 07/13/2019    Lab Results  Component Value Date   THYROTRECAB 2.08 (H)  10/11/2019   THYROTRECAB 1.66 07/13/2019   THYROTRECAB 1.79 (H) 03/30/2019     Allergies as of 10/16/2019      Reactions   Vicodin [hydrocodone-acetaminophen] Other (See Comments)   Insomnia       Medication List       Accurate as of October 16, 2019  1:29 PM. If you have any questions, ask your nurse or doctor.        albuterol 108 (90 Base) MCG/ACT inhaler Commonly known as: VENTOLIN HFA Inhale 2 puffs into the lungs every 6 (six) hours as needed for wheezing.   ALPRAZolam 0.25 MG tablet Commonly known as: XANAX TAKE 1 TABLET BY MOUTH TWICE A DAY AS NEEDED   aspirin EC 81 MG tablet Take 81 mg by mouth daily.   atenolol 50 MG tablet Commonly known as: TENORMIN TAKE 1 TABLET BY MOUTH TWICE A DAY   atorvastatin 40 MG tablet Commonly known as: LIPITOR TAKE 1 TABLET BY MOUTH DAILY AT 6 PM.   BD Pen Needle Nano U/F 32G X 4 MM Misc Generic drug: Insulin Pen Needle USE DAILY AS DIRECTED WITH LEVEMIR   gabapentin 100 MG capsule Commonly known as: NEURONTIN Take 2 capsules (200 mg total) by mouth 3 (three) times daily.   Levemir  FlexTouch 100 UNIT/ML FlexPen Generic drug: insulin detemir INJECT 15 UNITS INTO THE SKIN DAILY AT 10 PM.   losartan 100 MG tablet Commonly known as: COZAAR Take 1 tablet (100 mg total) by mouth daily.   meloxicam 15 MG tablet Commonly known as: MOBIC Take 1 tablet (15 mg total) by mouth daily.   metFORMIN 1000 MG tablet Commonly known as: GLUCOPHAGE TAKE 1 TABLET BY MOUTH 2 TIMES DAILY WITH A MEAL.   methimazole 5 MG tablet Commonly known as: TAPAZOLE Tale 1/2 tablet by mouth once daily.   omeprazole 40 MG capsule Commonly known as: PRILOSEC TAKE 1 CAPSULE BY MOUTH EVERY DAY   OneTouch Delica Lancets 99991111 Misc 1 application by Does not apply route 2 (two) times daily. Use to check blood sugar up to two times daily. E11.69   OneTouch Verio test strip Generic drug: glucose blood USE AS INSTRUCTED   traMADol 50 MG tablet Commonly known as: ULTRAM TAKE 1 TABLET BY MOUTH EVERY 6 HOURS AS NEEDED FOR SPINAL STENOSIS PAIN           Past Medical History:  Diagnosis Date  . Actinic keratoses    follows with dermatology   . Arthritis   . Asthma   . Chronic back pain    DDD,herniated disc/spondylosis/radiculopathy  . COPD (chronic obstructive pulmonary disease) ([place])   . Diabetes mellitus    1980s  . Early cataracts, bilateral    Dr. Ruthine Dose - [facility]  . GERD (gastroesophageal reflux disease)    takes Omeprazole daily  . Hemorrhoids   . History of colonic polyps    colonoscopy 04/2012 - Dr Candace Cruise  . Hyperlipidemia    takes Pravastatin daily  . Hypertension    takes Metoprolol/HCTZ daily  . Hyperthyroidism   . Insomnia   . Joint pain    fingers  . Joint swelling   . Pneumonia    x 2 ;in Dec 2010/2011  . PONV (postoperative nausea and vomiting)   . Right leg weakness     Past Surgical History:  Procedure Laterality Date  . ABDOMINAL HYSTERECTOMY     at age 74  . BACK SURGERY  20+yrs ago  . CATARACT EXTRACTION W/PHACO  Left 01/04/2019   Procedure:  CATARACT EXTRACTION PHACO AND INTRAOCULAR LENS PLACEMENT ([place])  LEFT;  Surgeon: Leandrew Koyanagi, MD;  Location: [place] [facility];  Service: Ophthalmology;  Laterality: Left;  GIVE IV ZOFRAN  . CATARACT EXTRACTION W/PHACO Right 02/01/2019   Procedure: CATARACT EXTRACTION PHACO AND INTRAOCULAR LENS PLACEMENT ([place])  RIGHT DIABETIC;  Surgeon: Leandrew Koyanagi, MD;  Location: [facility];  Service: Ophthalmology;  Laterality: Right;  DIABETIC GIVE IV ZOFRAN  . COLONOSCOPY    . COLONOSCOPY N/A 05/13/2015   Procedure: COLONOSCOPY;  Surgeon: Hulen Luster, MD;  [facility] ENDOSCOPY;  Service: Gastroenterology;  Laterality: N/A;  . ESOPHAGOGASTRODUODENOSCOPY  2011  . ESOPHAGOGASTRODUODENOSCOPY (EGD) WITH PROPOFOL N/A 09/02/2017   Procedure: ESOPHAGOGASTRODUODENOSCOPY (EGD) WITH PROPOFOL;  Surgeon: Virgel Manifold, MD;  Location: [facility] ENDOSCOPY;  Service: Endoscopy;  Laterality: N/A;  . FOOT SURGERY  2011   bunionectomy and knot removed from bottom of both feet  . JOINT REPLACEMENT Left 2014   hip  Kraskinski  . LUMBAR LAMINECTOMY/DECOMPRESSION MICRODISCECTOMY  09/10/2011   Procedure: LUMBAR LAMINECTOMY/DECOMPRESSION MICRODISCECTOMY 1 LEVEL;  Surgeon: Hosie Spangle, MD;  Location: [place] [facility]UR[facility];  Service: Neurosurgery;  Laterality: Right;  RIGHT Lumbar  Laminotomy and microdiskectomy Lumbar Four-Five  . LUMBAR LAMINECTOMY/DECOMPRESSION MICRODISCECTOMY N/A 06/26/2015   Procedure: LUMBAR LAMINECTOMY/DECOMPRESSION MICRODISCECTOMY 1 LEVEL;  Surgeon: Jovita Gamma, MD;  Location: [place] [facility];  Service: Neurosurgery;  Laterality: N/A;  L3 and L4 Laminectomies  . MELANOMA EXCISION  2011   on nose/back on neck  . THORACIC DISC SURGERY  at age 47 and 44    mass of veins that had gotten into muscle and wrapped around rib;3 ribs also removed from left side  . TONSILLECTOMY  as a child   and adenoids   . TOTAL HIP ARTHROPLASTY Left 2014   Dr. Mack Guise  . TUBAL LIGATION    . VAGINA  SURGERY     vaginal wall ruptured     Family History  Problem Relation Age of Onset  . Heart disease Brother        valve replacement  . COPD Brother   . COPD Mother        Emphysema  . Diabetes Mother   . Other Mother        Died in house fire at age 56.  Marland Kitchen Heart disease Father   . Seizures Father   . Cancer Father        Lung cancer with brain metastasis  . Asthma Brother   . COPD Son 25  . Cancer Other        aunt ? maternal vs paternal side lung cancer   . Diabetes Maternal Grandmother   . Anesthesia problems Neg Hx   . Hypotension Neg Hx   . Malignant hyperthermia Neg Hx   . Pseudochol deficiency Neg Hx   . Thyroid disease Neg Hx   . Breast cancer Neg Hx     Social History:  reports that she quit smoking about 3 years ago. Her smoking use included cigarettes. She has a 5.00 pack-year smoking history. She has never used smokeless tobacco. She reports that she does not drink alcohol or use drugs.  Allergies:  Allergies  Allergen Reactions  . Vicodin [Hydrocodone-Acetaminophen] Other (See Comments)    Insomnia      Review of Systems  She is on basal insulin for diabetes and followed by PCP A1c as follows   Lab Results  Component Value Date   HGBA1C  7.8 (H) 08/30/2019   HGBA1C 7.4 (H) 04/14/2019   HGBA1C 6.9 (H) 10/26/2018   Lab Results  Component Value Date   MICROALBUR 0.7 04/14/2019   LDLCALC 45 04/14/2019   CREATININE 0.98 08/30/2019      Examination:   BP 140/74 (BP Location: Left Arm, Patient Position: Sitting, Cuff Size: Large)   Pulse 70   Ht 5\' 4"  (1.626 m)   Wt 166 lb 6.4 oz (75.5 kg)   SpO2 96%   BMI 28.56 kg/m   No eye signs present Thyroid not palpable Biceps reflexes are normal    Assessment/Plan:   Hyperthyroidism, diagnosed to be from Graves' disease, on antithyroid drug treatment since 09/2017 No history of goiter  She is on  methimazole consistently for her recurrent hyperthyroidism since 02/2019 Now taking only 2.5 mg  daily  She feels well and has no recurrence of hypothyroid symptoms She has not been able to keep her weight down for other reasons  Her thyroid levels indicate TSH to be stable and free T4 and T3 had not gone up with reducing her methimazole on her last visit   Thyrotropin receptor antibody is slightly above normal and not clear if this is prognostic at this point  She may be able to stop her methimazole now and will give her a trial off of the medication She will call if she has any recurrence of her previous hyperthyroid symptoms but follow-up in 6 weeks   Elayne Snare 10/16/2019, 1:29 PM    Note: This office note was prepared with Dragon voice recognition system technology. Any transcriptional errors that result from this process are unintentional.

## 2019-11-01 ENCOUNTER — Other Ambulatory Visit: Payer: Self-pay | Admitting: Internal Medicine

## 2019-11-02 ENCOUNTER — Other Ambulatory Visit: Payer: Self-pay | Admitting: Internal Medicine

## 2019-11-04 ENCOUNTER — Other Ambulatory Visit: Payer: Self-pay | Admitting: Internal Medicine

## 2019-11-11 ENCOUNTER — Other Ambulatory Visit: Payer: Self-pay | Admitting: Internal Medicine

## 2019-11-13 NOTE — Telephone Encounter (Signed)
Refill request for tramadol, last seen 10-10-19, last filled 05-25-19.  Please advise.

## 2019-11-21 ENCOUNTER — Other Ambulatory Visit: Payer: Self-pay

## 2019-11-21 ENCOUNTER — Encounter: Payer: Self-pay | Admitting: Physical Therapy

## 2019-11-21 ENCOUNTER — Ambulatory Visit: Payer: Medicare HMO | Attending: Otolaryngology | Admitting: Physical Therapy

## 2019-11-21 DIAGNOSIS — R42 Dizziness and giddiness: Secondary | ICD-10-CM

## 2019-11-21 DIAGNOSIS — R2681 Unsteadiness on feet: Secondary | ICD-10-CM | POA: Diagnosis present

## 2019-11-21 NOTE — Therapy (Signed)
[place] MAIN [facility] SERVICES [place], [place], [address] Phone: [phone]   Fax:  [phone]  Physical Therapy Evaluation  Patient Details  Name: Maria Hinton MRN: [id number] Date of Birth: April 02, 1946 Referring Provider (PT): Dr. Carmin Richmond   Encounter Date: 11/21/2019  PT End of Session - 11/21/19 0912    Visit Number  1    Number of Visits  10    Date for PT Re-Evaluation  01/16/20    Equipment Utilized During Treatment  Gait belt    Activity Tolerance  Patient tolerated treatment well    Behavior During Therapy  [facility] for tasks assessed/performed       Past Medical History:  Diagnosis Date  . Actinic keratoses    follows with dermatology   . Arthritis   . Asthma   . Chronic back pain    DDD,herniated disc/spondylosis/radiculopathy  . COPD (chronic obstructive pulmonary disease) ([place])   . Diabetes mellitus    1980s  . Early cataracts, bilateral    Dr. Ruthine Dose - [facility]  . GERD (gastroesophageal reflux disease)    takes Omeprazole daily  . Hemorrhoids   . History of colonic polyps    colonoscopy 04/2012 - Dr Candace Cruise  . Hyperlipidemia    takes Pravastatin daily  . Hypertension    takes Metoprolol/HCTZ daily  . Hyperthyroidism   . Insomnia   . Joint pain    fingers  . Joint swelling   . Pneumonia    x 2 ;in Dec 2010/2011  . PONV (postoperative nausea and vomiting)   . Right leg weakness     Past Surgical History:  Procedure Laterality Date  . ABDOMINAL HYSTERECTOMY     at age 57  . BACK SURGERY  20+yrs ago  . CATARACT EXTRACTION W/PHACO Left 01/04/2019   Procedure: CATARACT EXTRACTION PHACO AND INTRAOCULAR LENS PLACEMENT ([place])  LEFT;  Surgeon: Leandrew Koyanagi, MD;  Location: [place];  Service: Ophthalmology;  Laterality: Left;  GIVE IV ZOFRAN  . CATARACT EXTRACTION W/PHACO Right 02/01/2019   Procedure: CATARACT EXTRACTION PHACO AND INTRAOCULAR LENS PLACEMENT ([place])  RIGHT  DIABETIC;  Surgeon: Leandrew Koyanagi, MD;  Location: [place];  Service: Ophthalmology;  Laterality: Right;  DIABETIC GIVE IV ZOFRAN  . COLONOSCOPY    . COLONOSCOPY N/A 05/13/2015   Procedure: COLONOSCOPY;  Surgeon: Hulen Luster, MD;  Location[facility] ENDOSCOPY;  Service: Gastroenterology;  Laterality: N/A;  . ESOPHAGOGASTRODUODENOSCOPY  2011  . ESOPHAGOGASTRODUODENOSCOPY (EGD) WITH PROPOFOL N/A 09/02/2017   Procedure: ESOPHAGOGASTRODUODENOSCOPY (EGD) WITH PROPOFOL;  Surgeon: Virgel Manifold, MD;  Location: [facility] ENDOSCOPY;  Service: Endoscopy;  Laterality: N/A;  . FOOT SURGERY  2011   bunionectomy and knot removed from bottom of both feet  . JOINT REPLACEMENT Left 2014   hip  Kraskinski  . LUMBAR LAMINECTOMY/DECOMPRESSION MICRODISCECTOMY  09/10/2011   Procedure: LUMBAR LAMINECTOMY/DECOMPRESSION MICRODISCECTOMY 1 LEVEL;  Surgeon: Hosie Spangle, MD;  Location: [place] NEURO ORS;  Service: Neurosurgery;  Laterality: Right;  RIGHT Lumbar  Laminotomy and microdiskectomy Lumbar Four-Five  . LUMBAR LAMINECTOMY/DECOMPRESSION MICRODISCECTOMY N/A 06/26/2015   Procedure: LUMBAR LAMINECTOMY/DECOMPRESSION MICRODISCECTOMY 1 LEVEL;  Surgeon: Jovita Gamma, MD;  Location: [place] NEURO ORS;  Service: Neurosurgery;  Laterality: N/A;  L3 and L4 Laminectomies  . MELANOMA EXCISION  2011   on nose/back on neck  . THORACIC DISC SURGERY  at age 33 and 28    mass of veins that had gotten into muscle and wrapped around  rib;3 ribs also removed from left side  . TONSILLECTOMY  as a child   and adenoids   . TOTAL HIP ARTHROPLASTY Left 2014   Dr. Mack Guise  . TUBAL LIGATION    . VAGINA SURGERY     vaginal wall ruptured     There were no vitals filed for this visit.  Subjective Assessment - 11/23/19 1501    Subjective  Patient reports that she has difficult with walking due to imbalance and dizziness symtoms.    Pertinent History  Patient reports vertigo, unsteadiness, lightheadedness, imbalance, falling,  swimmy-headed sensation and aural fullness left more than the right. Patient reports that bending over, standing up, quick head turns, rolling over all provoke her symptoms. Patient reports staying still helps ease her symptoms. Patient reports she is getting episodes of dizziness several times a day that last 2-10 minutes.Patient reports that she veers when she walks and tends to veer to the left. Patient reports that her symptoms are motion provoked and intermittent. Patient reports she has had dizziness that started over a year ago. Patient states several years ago since mowing the lawn she started to experience sharp pain in the left side of her head behind her ear and states the dizziness started shortly after that. Patient states she had a brain MRI that was normal.  Patient has seen Dr. Carmin Richmond in regards to her dizziness symptoms and she had a VNG test which revealed bilateral vestibular deficits.    Diagnostic tests  VNG: revealed bilateral vestibular hypofunction per MR    Patient Stated Goals  to have decreased dizziness and improved balance        [facility] PT Assessment - 11/21/19 0843      Assessment   Medical Diagnosis  dizziness and giddiness    Referring Provider (PT)  Dr. Loletha Grayer Vaught    Onset Date/Surgical Date  --   about 1 year ago   Prior Therapy  no prior vestibular therapy      Precautions   Precautions  Fall      Restrictions   Weight Bearing Restrictions  No      Balance Screen   Has the patient fallen in the past 6 months  Yes    How many times?  3    Has the patient had a decrease in activity level because of a fear of falling?   Yes    Is the patient reluctant to leave their home because of a fear of falling?   Yes      [place]  Private residence    Living Arrangements  Alone    Available Help at Discharge  Family    Type of [place] Access  Level entry    [place] - single  point;Walker - 2 wheels      Prior Function   Level of Independence  Independent with community mobility without device      Cognition   Overall Cognitive Status  Within Functional Limits for tasks assessed      Standardized Balance Assessment   Standardized Balance Assessment  Dynamic Gait Index      Dynamic Gait Index   Level Surface  Mild Impairment    Change in Gait Speed  Moderate Impairment    Gait with Horizontal Head Turns  Moderate Impairment    Gait with Vertical Head Turns  Moderate Impairment  Gait and Pivot Turn  Mild Impairment    Step Over Obstacle  Moderate Impairment    Step Around Obstacles  Mild Impairment    Steps  Moderate Impairment    Total Score  11         VESTIBULAR AND BALANCE EVALUATION   HISTORY:  Subjective history of current problem: Patient reports vertigo, unsteadiness, lightheadedness, imbalance, falling, swimmy-headed sensation and aural fullness left more than the right. Patient reports that bending over, standing up, quick head turns, rolling over all provoke her symptoms. Patient reports staying still helps ease her symptoms. Patient reports she is getting episodes of dizziness several times a day that last 2-10 minutes.Patient reports that she veers when she walks and tends to veer to the left. Patient reports that her symptoms are motion provoked and intermittent. Patient reports she has had dizziness that started over a year ago. Patient states several years ago since mowing the lawn she started to experience sharp pain in the left side of her head behind her ear and states the dizziness started shortly after that. Patient states she had a brain MRI that was normal.  Patient has seen Dr. Carmin Richmond in regards to her dizziness symptoms and she had a VNG test which revealed bilateral vestibular deficits.   Progression of symptoms: no change since onset History of similar episodes: no prior episodes  Falls (yes/no): yes Number of falls in past  6 months: 3 or more several states she trips over own feet, gets swimmy headed and runs into things; states she has several missteps where she has to reach to catch her balance as well.   Auditory complaints (tinnitus, pain, drainage): patient reports she gets sharp pains at time in the left ear  Vision (last eye exam, diplopia, recent changes): reports states she has had cataract surgery in July 2020. Reports blurry vision with episodes of the dizziness.    EXAMINATION  SOMATOSENSORY:  Any N & T in extremities or weakness: reports she does neuropathy in her feet and states her feet hurt and are numb all the time and states she has some tingling in her hands due to arthritis      COORDINATION: Finger to Nose:   Normal Past Pointing:  Normal  MUSCULOSKELETAL SCREEN: Cervical Spine ROM: WFL  Gait: Patient arrives ambulating without AD. Patient ambulates with markedly decreased gait speed with decreased step length and height.  Scanning of visual environment with gait is: poor  Balance: Patient is challenged by narrow base of support, uneven surfaces, eyes closed, single leg stance, activities with head and body turns.   POSTURAL CONTROL TESTS:  Clinical Test of Sensory Interaction for Balance (CTSIB):  CONDITION TIME STRATEGY SWAY  Eyes open, firm surface 30 seconds ankle +2  Eyes closed, firm surface 30 seconds ankle +3  Eyes open, foam surface 30 seconds ankle +2  Eyes closed, foam surface 30 seconds ankle +3    OCULOMOTOR / VESTIBULAR TESTING: Oculomotor Exam- Room Light  Normal Abnormal Comments  Ocular Alignment N    Ocular ROM N    Spontaneous Nystagmus N    Gaze evoked Nystagmus  Abn Nystagmus at end range could be age appropriate  Smooth Pursuit N    Saccades  Abn Slow speed and hypometric saccades noted all fields  VOR  Abn Blurring and dizziness  VOR Cancellation  Abn Blurring of target   Left Head Impulse   deferred  Right Head Impulse   deferred    BPPV  TESTS: Will assess at later visit  FUNCTIONAL OUTCOME MEASURES:  Results Comments  DHI 74/100 Severe perception of handicap; in need of intervention  ABC Scale 1.9% High falls risk; in need of intervention  DGI 11/24 Falls risk; in need of intervention  FOTO 33/100 Given the patient's risk adjustment variables, like-patients nationally had a FS score of 66/100 at intake  10 meter Walking Speed 0.55 M/sec Below average as compared to age and gender normative values    VOR X 1 exercise:  Demonstrated and educated as to VOR X1.  Patient performed VOR X 1 horizontal in sitting 3 reps of 1 minute each with verbal cues for technique.  Patient reports the target is blurring and reports 5/10 dizziness.  Patient noted to have saccadic eye movements with VOR X 1. Issued for HEP.    PT Education - 11/21/19 0911    Education Details  discussed POC and goals; issued VOR X 1 for HEP    Person(s) Educated  Patient    Methods  Explanation;Demonstration;Verbal cues;Handout    Comprehension  Verbalized understanding;Returned demonstration       PT Short Term Goals - 11/21/19 0913      PT SHORT TERM GOAL #1   Title  Patient will be able to perform home program independently for self-management.    Time  4    Period  Weeks    Status  New    Target Date  12/19/19        PT Long Term Goals - 11/21/19 0913      PT LONG TERM GOAL #1   Title  Patient will reduce falls risk as indicated by Activities Specific Balance Confidence Scale (ABC) >67%.    Baseline  scored 1.9% on 11/21/19    Time  8    Period  Weeks    Status  New    Target Date  01/16/20      PT LONG TERM GOAL #2   Title  Patient will reduce perceived disability to low levels as indicated by <40 on Dizziness Handicap Inventory.    Baseline  scored 74/100 on 11/21/19    Time  8    Period  Weeks    Status  New    Target Date  01/16/20      PT LONG TERM GOAL #3   Title  Patient will report 50% or greater improvement in her  symptoms of dizziness and imbalance with provoking motions or positions.    Time  8    Period  Weeks    Status  New    Target Date  01/16/20      PT LONG TERM GOAL #4   Title  Patient will have increase in FOTO score of 20 points or more in order to demonstrate improvements in patient's functional activity level.    Baseline  scored 33/100 on 11/21/2019;        Plan - 11/23/19 1442    Clinical Impression Statement  Patient with reports of dizziness that began over one year ago. Per MR, patient had VNG which revealed bilateral vestibular hypofunction. Patient presents with potential indicators of both central and peripheral symptoms with vestibulo-ocular testing. Patient would benefit from PT services to address functional defcitis and goals as set on plan of care.    Personal Factors and Comorbidities  Comorbidity 3+;Time since onset of injury/illness/exacerbation    Comorbidities  DM, HTN, hyperthyroidism    Examination-Activity Limitations  Bathing;Bend;Stairs;Bed Mobility;Stand    Stability/Clinical  Decision Making  Evolving/Moderate complexity    Clinical Decision Making  Moderate    Rehab Potential  Good    PT Frequency  1x / week    PT Duration  8 weeks    PT Treatment/Interventions  [place] Repostioning;Gait training;Stair training;Therapeutic exercise;Balance training;Neuromuscular re-education;Patient/family education;Vestibular    PT Next Visit Plan  review VOR X 1; try FT and semi-tandem stance with head turns, ambulation with head turns    PT Home Exercise Plan  VOR X 1 in sitting    Consulted and Agree with Plan of Care  Patient               Patient will benefit from skilled therapeutic intervention in order to improve the following deficits and impairments:     Visit Diagnosis: Dizziness and giddiness  Unsteadiness on feet     Problem List Patient Active Problem List   Diagnosis Date Noted  . Vertigo 10/10/2019  . Joint pain in both hands 10/10/2019   . Benign positional vertigo 09/08/2019  . Orthostasis 09/08/2019  . Cervical spondylosis with radiculopathy 09/08/2019  . Adhesive capsulitis of shoulder 05/01/2019  . Displacement of lumbar intervertebral disc without myelopathy 05/01/2019  . Osteoarthritis of spinal facet joint 05/01/2019  . Primary localized osteoarthritis of pelvic region and thigh 05/01/2019  . Type 2 diabetes mellitus with diabetic neuropathy, unspecified ([place]) 04/17/2019  . Hyperthyroidism 10/27/2018  . Elevated alkaline phosphatase measurement 11/12/2017  . Elevated liver enzymes 10/10/2017  . Right lumbar radiculopathy 09/22/2017  . Esophagitis   . Columnar-lined esophagus   . Gastric polyp   . Hiatal hernia   . Weakness 08/26/2017  . Dysphagia 05/24/2017  . Generalized weakness 05/24/2017  . Back pain 05/24/2017  . Left lumbar radiculopathy 12/30/2016  . Skin neoplasm 06/06/2016  . Venous (peripheral) insufficiency 06/06/2016  . Right hip pain 12/12/2015  . Insomnia secondary to anxiety 12/12/2015  . History of lumbar laminectomy for spinal cord decompression 08/18/2015  . Extrinsic asthma, unspecified 02/21/2014  . History of tobacco abuse 02/21/2014  . Screening for osteoporosis 02/09/2014  . Pulmonary disease 02/09/2014  . Encounter for smoking cessation counseling 04/27/2013  . Arthritis 04/27/2013  . DM type 2 (diabetes mellitus, type 2) ([place]) 04/11/2013  . HTN (hypertension), benign 04/11/2013  . HLD (hyperlipidemia) 04/11/2013  . Anemia 06/13/2012  . Left leg pain 06/13/2012   Lady Deutscher PT, DPT [phone] Lady Deutscher 11/21/2019, 10:39 AM  [place] MAIN [facility] SERVICES [address] [place], [place], [address] Phone: [phone]   Fax:  [phone]  Name: Maria Hinton MRN: [id number] Date of Birth: 05/29/1946

## 2019-11-27 ENCOUNTER — Other Ambulatory Visit: Payer: Self-pay

## 2019-11-27 ENCOUNTER — Other Ambulatory Visit (INDEPENDENT_AMBULATORY_CARE_PROVIDER_SITE_OTHER): Payer: Medicare HMO

## 2019-11-27 DIAGNOSIS — E0590 Thyrotoxicosis, unspecified without thyrotoxic crisis or storm: Secondary | ICD-10-CM

## 2019-11-27 LAB — TSH: TSH: <0.01 u[IU]/mL — ABNORMAL LOW (ref 0.35–4.50)

## 2019-11-27 LAB — T4, FREE: Free T4: 1.82 ng/dL — ABNORMAL HIGH (ref 0.60–1.60)

## 2019-11-27 LAB — T3, FREE: T3, Free: 4.2 pg/mL (ref 2.3–4.2)

## 2019-11-28 ENCOUNTER — Encounter: Payer: Self-pay | Admitting: Physical Therapy

## 2019-11-28 ENCOUNTER — Ambulatory Visit: Payer: Medicare HMO | Admitting: Physical Therapy

## 2019-11-28 DIAGNOSIS — R42 Dizziness and giddiness: Secondary | ICD-10-CM

## 2019-11-28 DIAGNOSIS — R2681 Unsteadiness on feet: Secondary | ICD-10-CM

## 2019-11-28 NOTE — Therapy (Signed)
[place] MAIN [facility] SERVICES [address] [place], [place], [address] Phone: [phone]   Fax:  [phone]  Physical Therapy Treatment  Patient Details  Name: Maria Hinton MRN: [id number] Date of Birth: [birth year] Referring Provider (PT): Dr. Carmin Richmond   Encounter Date: 11/28/2019  PT End of Session - 11/29/19 1700    Visit Number  2    Number of Visits  10    Date for PT Re-Evaluation  01/16/20    PT Start Time  1031    PT Stop Time  1112    PT Time Calculation (min)  41 min    Equipment Utilized During Treatment  Gait belt    Activity Tolerance  Patient tolerated treatment well    Behavior During Therapy  [facility] for tasks assessed/performed       Past Medical History:  Diagnosis Date  . Actinic keratoses    follows with dermatology   . Arthritis   . Asthma   . Chronic back pain    DDD,herniated disc/spondylosis/radiculopathy  . COPD (chronic obstructive pulmonary disease) ([place])   . Diabetes mellitus    1980s  . Early cataracts, bilateral    Dr. Ruthine Dose - [facility]  . GERD (gastroesophageal reflux disease)    takes Omeprazole daily  . Hemorrhoids   . History of colonic polyps    colonoscopy 04/2012 - Dr Candace Cruise  . Hyperlipidemia    takes Pravastatin daily  . Hypertension    takes Metoprolol/HCTZ daily  . Hyperthyroidism   . Insomnia   . Joint pain    fingers  . Joint swelling   . Pneumonia    x 2 ;in Dec 2010/2011  . PONV (postoperative nausea and vomiting)   . Right leg weakness     Past Surgical History:  Procedure Laterality Date  . ABDOMINAL HYSTERECTOMY     at age 83  . BACK SURGERY  20+yrs ago  . CATARACT EXTRACTION W/PHACO Left 01/04/2019   Procedure: CATARACT EXTRACTION PHACO AND INTRAOCULAR LENS PLACEMENT ([place])  LEFT;  Surgeon: Leandrew Koyanagi, MD;  Location: [place];  Service: Ophthalmology;  Laterality: Left;  GIVE IV ZOFRAN  . CATARACT EXTRACTION W/PHACO Right 02/01/2019   Procedure: CATARACT EXTRACTION PHACO AND INTRAOCULAR LENS PLACEMENT ([employer])  RIGHT DIABETIC;  Surgeon: Leandrew Koyanagi, MD;  Location: [place];  Service: Ophthalmology;  Laterality: Right;  DIABETIC GIVE IV ZOFRAN  . COLONOSCOPY    . COLONOSCOPY N/A 05/13/2015   Procedure: COLONOSCOPY;  Surgeon: Hulen Luster, MD;  Location: [facility] ENDOSCOPY;  Service: Gastroenterology;  Laterality: N/A;  . ESOPHAGOGASTRODUODENOSCOPY  2011  . ESOPHAGOGASTRODUODENOSCOPY (EGD) WITH PROPOFOL N/A 09/02/2017   Procedure: ESOPHAGOGASTRODUODENOSCOPY (EGD) WITH PROPOFOL;  Surgeon: Virgel Manifold, MD;  Location: [facility] ENDOSCOPY;  Service: Endoscopy;  Laterality: N/A;  . FOOT SURGERY  2011   bunionectomy and knot removed from bottom of both feet  . JOINT REPLACEMENT Left 2014   hip  Kraskinski  . LUMBAR LAMINECTOMY/DECOMPRESSION MICRODISCECTOMY  09/10/2011   Procedure: LUMBAR LAMINECTOMY/DECOMPRESSION MICRODISCECTOMY 1 LEVEL;  Surgeon: Hosie Spangle, MD;  Location: [place] NEURO ORS;  Service: Neurosurgery;  Laterality: Right;  RIGHT Lumbar  Laminotomy and microdiskectomy Lumbar Four-Five  . LUMBAR LAMINECTOMY/DECOMPRESSION MICRODISCECTOMY N/A 06/26/2015   Procedure: LUMBAR LAMINECTOMY/DECOMPRESSION MICRODISCECTOMY 1 LEVEL;  Surgeon: Jovita Gamma, MD;  Location: [place] NEURO ORS;  Service: Neurosurgery;  Laterality: N/A;  L3 and L4 Laminectomies  . MELANOMA EXCISION  2011   on nose/back on neck  .  THORACIC DISC SURGERY  at age 58 and 31    mass of veins that had gotten into muscle and wrapped around rib;3 ribs also removed from left side  . TONSILLECTOMY  as a child   and adenoids   . TOTAL HIP ARTHROPLASTY Left 2014   Dr. Mack Guise  . TUBAL LIGATION    . VAGINA SURGERY     vaginal wall ruptured     There were no vitals filed for this visit.  Subjective Assessment - 11/28/19 1035    Subjective  Patient reports that she did the VOR X 1 exercise three times a day and reports it was bringing her symptoms.  Patient reports that she had a fall in her closet this past week and that her back is sore. Patient denies loss of consciousness but fall was unwitnessed.    Pertinent History  Patient reports vertigo, unsteadiness, lightheadedness, imbalance, falling, swimmy-headed sensation and aural fullness left more than the right. Patient reports that bending over, standing up, quick head turns, rolling over all provoke her symptoms. Patient reports staying still helps ease her symptoms. Patient reports she is getting episodes of dizziness several times a day that last 2-10 minutes.Patient reports that she veers when she walks and tends to veer to the left. Patient reports that her symptoms are motion provoked and intermittent. Patient reports she has had dizziness that started over a year ago. Patient states several years ago since mowing the lawn she started to experience sharp pain in the left side of her head behind her ear and states the dizziness started shortly after that. Patient states she had a brain MRI that was normal.  Patient has seen Dr. Carmin Richmond in regards to her dizziness symptoms and she had a VNG test which revealed bilateral vestibular deficits.    Diagnostic tests  VNG: revealed bilateral vestibular hypofunction per MR    Patient Stated Goals  to have decreased dizziness and improved balance       Neuromuscular Re-education:  VOR X 1 exercise:  Patient performed VOR X 1 horizontal in sitting 1 rep of 1 minute each with verbal cues for technique for amount of head excursion and speed of movement.  Patient performed VOR X 1 horizontal in standing 2 reps of 1 minute each with verbal cues for technique. Patient reports that standing was more difficult and that the target appeared to sway mildly.  Patient reports the target blurs at slower speeds and reports 7/10 dizziness.  Patient to continue to perform VOR X 1 in sitting for HEP.   Ambulation with head turns:  Patient performed 99' trials of  forwards and retro ambulation with horizontal and vertical head turns with CGA.   Patient demonstrates decreased gait speed and step length with retro ambulation, but no overt losses of balance.   Patient reports mild dizziness and imbalance with ambulation with head turns.  Patient reports 2-3/10 dizziness after this activity.   Ball circles: Worked on standing holding ball and doing ball toss to self horizontally and then ball circles.  Patient reports 4/10 dizziness with this activity.  Issued MedBridge HEP template- feet together and semi tandem progressions on firm surface with horizontal and vertical head turns.     PT Education - 11/29/19 1700    Education Details  reviewed VOR X1 and added additional exercises via MedBridge HEP- feet together and semi-tandem progressions on firm surface with horizontal and vertical head turns.    Person(s) Educated  Patient  Methods  Explanation;Demonstration;Verbal cues;Handout    Comprehension  Verbalized understanding;Returned demonstration       PT Short Term Goals - 11/21/19 0913      PT SHORT TERM GOAL #1   Title  Patient will be able to perform home program independently for self-management.    Time  4    Period  Weeks    Status  New    Target Date  12/19/19        PT Long Term Goals - 11/21/19 0913      PT LONG TERM GOAL #1   Title  Patient will reduce falls risk as indicated by Activities Specific Balance Confidence Scale (ABC) >67%.    Baseline  scored 1.9% on 11/21/19    Time  8    Period  Weeks    Status  New    Target Date  01/16/20      PT LONG TERM GOAL #2   Title  Patient will reduce perceived disability to low levels as indicated by <40 on Dizziness Handicap Inventory.    Baseline  scored 74/100 on 11/21/19    Time  8    Period  Weeks    Status  New    Target Date  01/16/20      PT LONG TERM GOAL #3   Title  Patient will report 50% or greater improvement in her symptoms of dizziness and imbalance with  provoking motions or positions.    Time  8    Period  Weeks    Status  New    Target Date  01/16/20      PT LONG TERM GOAL #4   Title  Patient will have increase in FOTO score of 20 points or more in order to demonstrate improvements in patient's functional activity level.    Baseline  scored 33/100 on 11/21/2019;            Plan - 11/29/19 1704    Clinical Impression Statement  Patient reports that she fell this past week when she was in her closet and that her back is sore. Patient reports compliance with HEP which patient reports brings on her dizziness symptoms. Patient challenged by narrow base of support, activities on Airex pad and with head turns this date. Issued additional exericses for HEP. Patient would benefit from continued PT services to further address functional deficits and goals as set on plan of care.    Personal Factors and Comorbidities  Comorbidity 3+;Time since onset of injury/illness/exacerbation    Comorbidities  DM, HTN, hyperthyroidism    Examination-Activity Limitations  Bathing;Bend;Stairs;Bed Mobility;Stand    Stability/Clinical Decision Making  Evolving/Moderate complexity    Rehab Potential  Good    PT Frequency  1x / week    PT Duration  8 weeks    PT Treatment/Interventions  Canalith Repostioning;Gait training;Stair training;Therapeutic exercise;Balance training;Neuromuscular re-education;Patient/family education;Vestibular    PT Next Visit Plan  review VOR X 1; try FT and semi-tandem stance with head turns, ambulation with head turns    PT Home Exercise Plan  VOR X 1 in sitting and Issued MedBridge HEP template- feet together and semi tandem progressions on firm surface with horizontal and vertical head turns.    Consulted and Agree with Plan of Care  Patient       Patient will benefit from skilled therapeutic intervention in order to improve the following deficits and impairments:  Decreased balance, Dizziness  Visit Diagnosis: Dizziness and  giddiness  Unsteadiness on feet  Problem List Patient Active Problem List   Diagnosis Date Noted  . Vertigo 10/10/2019  . Joint pain in both hands 10/10/2019  . Benign positional vertigo 09/08/2019  . Orthostasis 09/08/2019  . Cervical spondylosis with radiculopathy 09/08/2019  . Adhesive capsulitis of shoulder 05/01/2019  . Displacement of lumbar intervertebral disc without myelopathy 05/01/2019  . Osteoarthritis of spinal facet joint 05/01/2019  . Primary localized osteoarthritis of pelvic region and thigh 05/01/2019  . Type 2 diabetes mellitus with diabetic neuropathy, unspecified ([place]) 04/17/2019  . Hyperthyroidism 10/27/2018  . Elevated alkaline phosphatase measurement 11/12/2017  . Elevated liver enzymes 10/10/2017  . Right lumbar radiculopathy 09/22/2017  . Esophagitis   . Columnar-lined esophagus   . Gastric polyp   . Hiatal hernia   . Weakness 08/26/2017  . Dysphagia 05/24/2017  . Generalized weakness 05/24/2017  . Back pain 05/24/2017  . Left lumbar radiculopathy 12/30/2016  . Skin neoplasm 06/06/2016  . Venous (peripheral) insufficiency 06/06/2016  . Right hip pain 12/12/2015  . Insomnia secondary to anxiety 12/12/2015  . History of lumbar laminectomy for spinal cord decompression 08/18/2015  . Extrinsic asthma, unspecified 02/21/2014  . History of tobacco abuse 02/21/2014  . Screening for osteoporosis 02/09/2014  . Pulmonary disease 02/09/2014  . Encounter for smoking cessation counseling 04/27/2013  . Arthritis 04/27/2013  . DM type 2 (diabetes mellitus, type 2) ([place]) 04/11/2013  . HTN (hypertension), benign 04/11/2013  . HLD (hyperlipidemia) 04/11/2013  . Anemia 06/13/2012  . Left leg pain 06/13/2012   Lady Deutscher PT, DPT [id number] Lady Deutscher 11/29/2019, 5:08 PM  [place] MAIN [facility] [address] [place], [place], [address] Phone: [phone]   Fax:  [phone]  Name: Maria Hinton MRN:  [id number] Date of Birth: 1945/08/27

## 2019-11-30 ENCOUNTER — Other Ambulatory Visit: Payer: Self-pay

## 2019-12-01 ENCOUNTER — Encounter: Payer: Self-pay | Admitting: Endocrinology

## 2019-12-01 ENCOUNTER — Telehealth: Payer: Self-pay

## 2019-12-01 ENCOUNTER — Ambulatory Visit: Payer: Medicare HMO | Admitting: Endocrinology

## 2019-12-01 VITALS — BP 150/82 | HR 74 | Ht 64.0 in | Wt 159.8 lb

## 2019-12-01 DIAGNOSIS — E0590 Thyrotoxicosis, unspecified without thyrotoxic crisis or storm: Secondary | ICD-10-CM | POA: Diagnosis not present

## 2019-12-01 MED ORDER — METHIMAZOLE 5 MG PO TABS: ORAL_TABLET | ORAL | 1 refills | Status: DC

## 2019-12-01 NOTE — Progress Notes (Addendum)
Patient ID: Maria Hinton, female   DOB: 08/18/1945, 74 y.o.   MRN: [id number]                                                                                                              Reason for Appointment:  Hyperthyroidism, follow-up visit   Referring physician: Derrel Nip      History of Present Illness:   Prior history:  For several months before her consultation she had symptoms of weakness, palpitations, shakiness, feeling sweaty in am, nervousness, and fatigue.  Also would feel a strong heartbeat in her ears She had been on atenolol for hypertension also She had a decreased appetite and started losing weight late last summer  The patient had lost about  26 lbs since these symptoms started   RECENT history:  She started taking methimazole in 09/2017 for her hyperthyroidism On her 10/2017 visit she was taking 10 mg twice daily because of hyperthyroidism she was given 30 mg daily  In follow-up she has been requiring periodically reduced doses of her methimazole  However since late 2019 she appears to be needing progressively higher doses of methimazole  On her visit in 12/19 she was having fatigue and weakness; did not complain of feeling shaky or having palpitations, no weight loss However her thyroid levels were significantly high with 7.5 mg of methimazole The dose was increased  RECENT history:  In 4/20 she was having problems with feeling sleepy and tired and having less motivation her TSH with taking 15 mg methimazole was significantly higher at 14.5 and low normal free T4 Subsequently her methimazole has been reduced progressively and in July was taking only 5 mg daily using half of the 10 mg tablet  Her methimazole was stopped on 01/17/2019 However on her visit on 02/28/2019 she was having symptoms of feeling hot and sweaty, also a little more tired and weak but no palpitations or significant weight loss TSH is was then suppressed and her free T4 was relatively high at  1.3 and she was started back on methimazole  She has been on low-dose methimazole subsequently and this was stopped on 10/16/2019 after being on 2.5 mg only She said that she is having issues with vertigo and does not feel well in general Also has lost weight and her appetite is somewhat decreased At times may feel a little shaky but no palpitations or heat intolerance  Her weight fluctuates and has gone down 7 pounds  Her free T4 is now above normal and TSH is suppressed    Wt Readings from Last 3 Encounters:  12/01/19 159 lb 12.8 oz (72.5 kg)  10/16/19 166 lb 6.4 oz (75.5 kg)  10/10/19 163 lb 6.4 oz (74.1 kg)     Thyrotropin receptor antibody slightly above normal as of 4/21   Thyroid function tests as follows:     Lab Results  Component Value Date   FREET4 1.82 (H) 11/27/2019   FREET4 0.96 10/11/2019   FREET4 0.94 08/30/2019   T3FREE 4.2 11/27/2019  T3FREE 2.8 10/11/2019   T3FREE 3.1 08/30/2019   TSH <0.01 (L) 11/27/2019   TSH 1.94 10/11/2019   TSH 3.04 08/30/2019    Lab Results  Component Value Date   THYROTRECAB 2.08 (H) 10/11/2019   THYROTRECAB 1.66 07/13/2019   THYROTRECAB 1.79 (H) 03/30/2019     Allergies as of 12/01/2019      Reactions   Vicodin [hydrocodone-acetaminophen] Other (See Comments)   Insomnia       Medication List       Accurate as of Dec 01, 2019  1:48 PM. If you have any questions, ask your nurse or doctor.        albuterol 108 (90 Base) MCG/ACT inhaler Commonly known as: VENTOLIN HFA Inhale 2 puffs into the lungs every 6 (six) hours as needed for wheezing.   ALPRAZolam 0.25 MG tablet Commonly known as: XANAX TAKE 1 TABLET BY MOUTH TWICE A DAY AS NEEDED   aspirin EC 81 MG tablet Take 81 mg by mouth daily.   atenolol 50 MG tablet Commonly known as: TENORMIN TAKE 1 TABLET BY MOUTH TWICE A DAY   atorvastatin 40 MG tablet Commonly known as: LIPITOR TAKE 1 TABLET BY MOUTH DAILY AT 6 PM.   BD Pen Needle Nano U/F 32G X 4 MM  Misc Generic drug: Insulin Pen Needle USE DAILY AS DIRECTED WITH LEVEMIR   gabapentin 100 MG capsule Commonly known as: NEURONTIN Take 2 capsules (200 mg total) by mouth 3 (three) times daily.   Levemir FlexTouch 100 UNIT/ML FlexPen Generic drug: insulin detemir INJECT 15 UNITS INTO THE SKIN DAILY AT 10 PM.   losartan 100 MG tablet Commonly known as: COZAAR Take 1 tablet (100 mg total) by mouth daily.   meloxicam 15 MG tablet Commonly known as: MOBIC TAKE 1 TABLET BY MOUTH EVERY DAY   metFORMIN 1000 MG tablet Commonly known as: GLUCOPHAGE TAKE 1 TABLET BY MOUTH 2 TIMES DAILY WITH A MEAL.   methimazole 5 MG tablet Commonly known as: TAPAZOLE Tale 1/2 tablet by mouth once daily.   omeprazole 40 MG capsule Commonly known as: PRILOSEC TAKE 1 CAPSULE BY MOUTH EVERY DAY   OneTouch Delica Lancets 99991111 Misc 1 application by Does not apply route 2 (two) times daily. Use to check blood sugar up to two times daily. E11.69   OneTouch Verio test strip Generic drug: glucose blood USE AS INSTRUCTED   traMADol 50 MG tablet Commonly known as: ULTRAM TAKE 1 TABLET BY MOUTH EVERY 6 HOURS AS NEEDED FOR SPINAL STENOSIS PAIN           Past Medical History:  Diagnosis Date  . Actinic keratoses    follows with dermatology   . Arthritis   . Asthma   . Chronic back pain    DDD,herniated disc/spondylosis/radiculopathy  . COPD (chronic obstructive pulmonary disease) ([place])   . Diabetes mellitus    1980s  . Early cataracts, bilateral    Dr. Ruthine Dose - [facility]  . GERD (gastroesophageal reflux disease)    takes Omeprazole daily  . Hemorrhoids   . History of colonic polyps    colonoscopy 04/2012 - Dr Candace Cruise  . Hyperlipidemia    takes Pravastatin daily  . Hypertension    takes Metoprolol/HCTZ daily  . Hyperthyroidism   . Insomnia   . Joint pain    fingers  . Joint swelling   . Pneumonia    x 2 ;in Dec 2010/2011  . PONV (postoperative nausea and vomiting)   . Right  leg weakness     Past Surgical History:  Procedure Laterality Date  . ABDOMINAL HYSTERECTOMY     at age 59  . BACK SURGERY  20+yrs ago  . CATARACT EXTRACTION W/PHACO Left 01/04/2019   Procedure: CATARACT EXTRACTION PHACO AND INTRAOCULAR LENS PLACEMENT ([place])  LEFT;  Surgeon: Leandrew Koyanagi, MD;  Location: [place];  Service: Ophthalmology;  Laterality: Left;  GIVE IV ZOFRAN  . CATARACT EXTRACTION W/PHACO Right 02/01/2019   Procedure: CATARACT EXTRACTION PHACO AND INTRAOCULAR LENS PLACEMENT ([place])  RIGHT DIABETIC;  Surgeon: Leandrew Koyanagi, MD;  Location: [place];  Service: Ophthalmology;  Laterality: Right;  DIABETIC GIVE IV ZOFRAN  . COLONOSCOPY    . COLONOSCOPY N/A 05/13/2015   Procedure: COLONOSCOPY;  Surgeon: Hulen Luster, MD;  Location: [facility] ENDOSCOPY;  Service: Gastroenterology;  Laterality: N/A;  . ESOPHAGOGASTRODUODENOSCOPY  2011  . ESOPHAGOGASTRODUODENOSCOPY (EGD) WITH PROPOFOL N/A 09/02/2017   Procedure: ESOPHAGOGASTRODUODENOSCOPY (EGD) WITH PROPOFOL;  Surgeon: Virgel Manifold, MD;  Location: [facility] ENDOSCOPY;  Service: Endoscopy;  Laterality: N/A;  . FOOT SURGERY  2011   bunionectomy and knot removed from bottom of both feet  . JOINT REPLACEMENT Left 2014   hip  Kraskinski  . LUMBAR LAMINECTOMY/DECOMPRESSION MICRODISCECTOMY  09/10/2011   Procedure: LUMBAR LAMINECTOMY/DECOMPRESSION MICRODISCECTOMY 1 LEVEL;  Surgeon: Hosie Spangle, MD;  Location: [place] NEURO ORS;  Service: Neurosurgery;  Laterality: Right;  RIGHT Lumbar  Laminotomy and microdiskectomy Lumbar Four-Five  . LUMBAR LAMINECTOMY/DECOMPRESSION MICRODISCECTOMY N/A 06/26/2015   Procedure: LUMBAR LAMINECTOMY/DECOMPRESSION MICRODISCECTOMY 1 LEVEL;  Surgeon: Jovita Gamma, MD;  Location: [place] NEURO ORS;  Service: Neurosurgery;  Laterality: N/A;  L3 and L4 Laminectomies  . MELANOMA EXCISION  2011   on nose/back on neck  . THORACIC DISC SURGERY  at age 5 and 33    mass of veins that had gotten into  muscle and wrapped around rib;3 ribs also removed from left side  . TONSILLECTOMY  as a child   and adenoids   . TOTAL HIP ARTHROPLASTY Left 2014   Dr. Mack Guise  . TUBAL LIGATION    . VAGINA SURGERY     vaginal wall ruptured     Family History  Problem Relation Age of Onset  . Heart disease Brother        valve replacement  . COPD Brother   . COPD Mother        Emphysema  . Diabetes Mother   . Other Mother        Died in house fire at age 46.  [place] Heart disease Father   . Seizures Father   . Cancer Father        Lung cancer with brain metastasis  . Asthma Brother   . COPD Son 20  . Cancer Other        aunt ? maternal vs paternal side lung cancer   . Diabetes Maternal Grandmother   . Anesthesia problems Neg Hx   . Hypotension Neg Hx   . Malignant hyperthermia Neg Hx   . Pseudochol deficiency Neg Hx   . Thyroid disease Neg Hx   . Breast cancer Neg Hx     Social History:  reports that she quit smoking about 3 years ago. Her smoking use included cigarettes. She has a 5.00 pack-year smoking history. She has never used smokeless tobacco. She reports that she does not drink alcohol or use drugs.  Allergies:  Allergies  Allergen Reactions  . Vicodin [Hydrocodone-Acetaminophen] Other (See Comments)  Insomnia      Review of Systems  She is on basal insulin with Levemir and Metformin for diabetes and followed by PCP A1c as follows   Lab Results  Component Value Date   HGBA1C 7.8 (H) 08/30/2019   HGBA1C 7.4 (H) 04/14/2019   HGBA1C 6.9 (H) 10/26/2018   Lab Results  Component Value Date   MICROALBUR 0.7 04/14/2019   LDLCALC 45 04/14/2019   CREATININE 0.98 08/30/2019      Examination:   BP (!) 150/82 (BP Location: Left Arm, Patient Position: Sitting, Cuff Size: Normal)   Pulse 74   Ht 5\' 4"  (1.626 m)   Wt 159 lb 12.8 oz (72.5 kg)   SpO2 97%   BMI 27.43 kg/m   Thyroid not palpable No tremor  Biceps reflexes are normal    Assessment/Plan:    Hyperthyroidism, diagnosed to be from Graves' disease, on antithyroid drug treatment since 09/2017 No history of goiter  She was on  methimazole low doses for her recurrent hyperthyroidism since 02/2019 However now even with stopping the 2.5 mg methimazole she has had a flareup of her hyperthyroidism Most of her symptoms are related to weight loss  As before has no goiter Since she has had difficulty getting the thyroid to be in remission she is agreeable to doing the I-131 treatment  Discussed in detail how the treatment would be done, what it consists of and follow-up plans and need for thyroid supplementation long-term subsequently In the meantime she will take methimazole 5 mg daily She was advised to hold it for 5 days before either the uptake on the treatment dose Patient information on radioactive iron was given on her visit summary   Elayne Snare 12/01/2019, 1:48 PM    Note: This office note was prepared with Dragon voice recognition system technology. Any transcriptional errors that result from this process are unintentional.  Addendum: Thyroid uptake is 39% at 24 hours, 13 mCi of I-131 ordered  Elayne Snare

## 2019-12-01 NOTE — Patient Instructions (Addendum)
Methimazole 5mg  daily  COMMON QUESTIONS ABOUT RADIOACTIVE IODINE TREATMENT   Why is radioactive iodine used?  Radioactive iodine is a very common option for treating an overactive thyroid. Normally the thyroid gland uses iodine to make thyroid hormone. An overactive thyroid gland extracts the iodine more completely from the bloodstream. When radioactive iodine is given orally most of it is retained within the overactive thyroid.  The concentrated radioactivity slowly destroys the thyroid cells and controls the over activity.  Because the radioactive rays travel only a very short distance other organs are not affected.  Thus the radioactive iodine works in a targeted manner effectively and safely.   How is radioactive iodine given?  It is usually given as a capsule in a single dose.  First, a very small test dose of radioactive iodine is given and the amount retained in the thyroid is measured the next day with a special counter.  This helps [place] calculate the dose of radioactive iodine to be given for treatment.  The radioactive iodine is given under the supervision of a Radiologist with the proper precautions.  Since the effective dose of the radioactive iodine is approximate, rarely one may need a second dose to control the over activity.Thus the treatment is extremely simple to take.  What happens after the radioactive iodine is given?  There is a gradual reduction in the over activity of the thyroid.  Initially it takes time to get rid of the excess thyroid hormone already present in the body.  With the slow destruction of the thyroid cells the high levels start coming down after 2 to 3 weeks.  It may take up to 8 weeks to completely control the thyroid.  Usually most of the thyroid cells are destroyed and thyroid levels start getting low by two months.  However this will vary from patient to patient and the thyroid levels may remain normal for some time.  It is very important to have regular  follow-up after the treatment.  Once the thyroid levels get low you will be started on a thyroid supplement, taken once daily for life.    Are there any side effects of radioactive iodine?  Generally no side effects are encountered.  Rarely one may have discomfort and swelling in the thyroid gland for a few days.  This should be reported if there is significant pain.  The radiation exposure to internal organs from radioactive iodine is no more than in a kidney x-ray or barium studies.  There are no effects on reproductive organs but women who are pregnant or nursing should not take radioactive iodine.  No long-term effects including cancer have been seen.  Women can have children after treatment with radioactive iodine although it is recommended to avoid pregnancy for about six months.

## 2019-12-01 NOTE — Telephone Encounter (Signed)
LM for Patient to [facility] for appointment information:    Patient has to be NPO 6 hours prior to 1st appointment.  Patient has to be off of any thyroid medication 5-7 days prior to 1st appointment.   Appointment is scheduled at [facility] in [place].    1st  appointment is 12-19-19 at 9 am then return at 1pm on the same day.   2nd appointment is 12-20-19 at 9 am.   Patient has to be off of any thyroid medication 5-7 days prior to 1st appointment.    Patient notified of appointments and instructions for the appointments.

## 2019-12-05 ENCOUNTER — Other Ambulatory Visit: Payer: Self-pay

## 2019-12-05 ENCOUNTER — Encounter: Payer: Self-pay | Admitting: Physical Therapy

## 2019-12-05 ENCOUNTER — Ambulatory Visit: Payer: Medicare HMO | Attending: Otolaryngology | Admitting: Physical Therapy

## 2019-12-05 DIAGNOSIS — R2681 Unsteadiness on feet: Secondary | ICD-10-CM | POA: Insufficient documentation

## 2019-12-05 DIAGNOSIS — R42 Dizziness and giddiness: Secondary | ICD-10-CM | POA: Diagnosis not present

## 2019-12-06 NOTE — Therapy (Signed)
[place] [facility] SERVICES [address] [place], [place], [address] Phone: [phone]   Fax:  [phone]  Physical Therapy Treatment  Patient Details  Name: Maria Hinton MRN: [id number] Date of Birth: 1945/11/24 Referring Provider (PT): Dr. Carmin Richmond   Encounter Date: 12/05/2019    Past Medical History:  Diagnosis Date  . Actinic keratoses    follows with dermatology   . Arthritis   . Asthma   . Chronic back pain    DDD,herniated disc/spondylosis/radiculopathy  . COPD (chronic obstructive pulmonary disease) ([place])   . Diabetes mellitus    1980s  . Early cataracts, bilateral    Dr. Ruthine Dose - [facility]  . GERD (gastroesophageal reflux disease)    takes Omeprazole daily  . Hemorrhoids   . History of colonic polyps    colonoscopy 04/2012 - Dr Candace Cruise  . Hyperlipidemia    takes Pravastatin daily  . Hypertension    takes Metoprolol/HCTZ daily  . Hyperthyroidism   . Insomnia   . Joint pain    fingers  . Joint swelling   . Pneumonia    x 2 ;in Dec 2010/2011  . PONV (postoperative nausea and vomiting)   . Right leg weakness     Past Surgical History:  Procedure Laterality Date  . ABDOMINAL HYSTERECTOMY     at age 69  . BACK SURGERY  20+yrs ago  . CATARACT EXTRACTION W/PHACO Left 01/04/2019   Procedure: CATARACT EXTRACTION PHACO AND INTRAOCULAR LENS PLACEMENT ([place])  LEFT;  Surgeon: Leandrew Koyanagi, MD;  Location: [place];  Service: Ophthalmology;  Laterality: Left;  GIVE IV ZOFRAN  . CATARACT EXTRACTION W/PHACO Right 02/01/2019   Procedure: CATARACT EXTRACTION PHACO AND INTRAOCULAR LENS PLACEMENT ([place])  RIGHT DIABETIC;  Surgeon: Leandrew Koyanagi, MD;  Location: [place];  Service: Ophthalmology;  Laterality: Right;  DIABETIC GIVE IV ZOFRAN  . COLONOSCOPY    . COLONOSCOPY N/A 05/13/2015   Procedure: COLONOSCOPY;  Surgeon: Hulen Luster, MD;  Location: [facility] ENDOSCOPY;  Service:  Gastroenterology;  Laterality: N/A;  . ESOPHAGOGASTRODUODENOSCOPY  2011  . ESOPHAGOGASTRODUODENOSCOPY (EGD) WITH PROPOFOL N/A 09/02/2017   Procedure: ESOPHAGOGASTRODUODENOSCOPY (EGD) WITH PROPOFOL;  Surgeon: Virgel Manifold, MD;  Location: [facility] ENDOSCOPY;  Service: Endoscopy;  Laterality: N/A;  . FOOT SURGERY  2011   bunionectomy and knot removed from bottom of both feet  . JOINT REPLACEMENT Left 2014   hip  Kraskinski  . LUMBAR LAMINECTOMY/DECOMPRESSION MICRODISCECTOMY  09/10/2011   Procedure: LUMBAR LAMINECTOMY/DECOMPRESSION MICRODISCECTOMY 1 LEVEL;  Surgeon: Hosie Spangle, MD;  Location: [place] NEURO ORS;  Service: Neurosurgery;  Laterality: Right;  RIGHT Lumbar  Laminotomy and microdiskectomy Lumbar Four-Five  . LUMBAR LAMINECTOMY/DECOMPRESSION MICRODISCECTOMY N/A 06/26/2015   Procedure: LUMBAR LAMINECTOMY/DECOMPRESSION MICRODISCECTOMY 1 LEVEL;  Surgeon: Jovita Gamma, MD;  Location: [place] NEURO ORS;  Service: Neurosurgery;  Laterality: N/A;  L3 and L4 Laminectomies  . MELANOMA EXCISION  2011   on nose/back on neck  . THORACIC DISC SURGERY  at age 49 and 43    mass of veins that had gotten into muscle and wrapped around rib;3 ribs also removed from left side  . TONSILLECTOMY  as a child   and adenoids   . TOTAL HIP ARTHROPLASTY Left 2014   Dr. Mack Guise  . TUBAL LIGATION    . VAGINA SURGERY     vaginal wall ruptured     There were no vitals filed for this visit.    Neuromuscular Re-education:  VOR  x 1 exercise: Patient performed VOR X 1 horizontal in standing on firm surface 3 reps of 1 minute  Patient reports no dizziness symptoms, but reports blurring of target with this exercise.  Active eye movement between two targets: Discussed and demonstrated active eye movements between two targets exercise.  Patient performed in unsupported standing active eye movements between two targets horizontal 2 reps of 60 seconds each.  Patient with verbal cuing initially for technique.   Patient denies dizziness with this activity.   Airex pad:  On Airex pad, patient performed feet together progressions and semi-tandem progressions with alternating lead leg with and without horizontal and vertical head turns with CGA.  On Airex pad, patient performed feet together progressions and semi-tandem progressions with eyes closed static holds multiple 30-45 second holds with CGA.   Body Wall Rolls:  Patient performed 6 reps of supported, body wall rolls with CGA.  Patient reports 4/10 after first rep with eyes open and 6-7/10 after eyes closed reps dizziness with this activity.   Diona Foley toss to self:  Patient performed static standing while tossing ball to self horizontal and then vertical while tracking ball with head and eyes.   Ball toss over shoulder: Patient performed static standing while tossing ball over one shoulder with return catch over opposite shoulder.  Then, patient performed multiple 58' trials of forward and retro ambulation while tossing ball over one shoulder with return catch over opposite shoulder with CGA.  Patient reports dizziness rated 5/10 with this activity.   Patient reports compliance with HEP and demonstrates good participation during session. Patient reported no dizziness with VOR and active eye movement between two targets exercises. However, patient reported dizziness with ball toss over shoulder activity. Will plan on repeating this activity and working on progressions as well as body wall rolls next session. Patient would benefit from continued PT services to further address goals and functional deficits.      PT Short Term Goals - 11/21/19 0913      PT SHORT TERM GOAL #1   Title  Patient will be able to perform home program independently for self-management.    Time  4    Period  Weeks    Status  New    Target Date  12/19/19        PT Long Term Goals - 11/21/19 0913      PT LONG TERM GOAL #1   Title  Patient will reduce falls risk as  indicated by Activities Specific Balance Confidence Scale (ABC) >67%.    Baseline  scored 1.9% on 11/21/19    Time  8    Period  Weeks    Status  New    Target Date  01/16/20      PT LONG TERM GOAL #2   Title  Patient will reduce perceived disability to low levels as indicated by <40 on Dizziness Handicap Inventory.    Baseline  scored 74/100 on 11/21/19    Time  8    Period  Weeks    Status  New    Target Date  01/16/20      PT LONG TERM GOAL #3   Title  Patient will report 50% or greater improvement in her symptoms of dizziness and imbalance with provoking motions or positions.    Time  8    Period  Weeks    Status  New    Target Date  01/16/20      PT LONG TERM GOAL #4  Title  Patient will have increase in FOTO score of 20 points or more in order to demonstrate improvements in patient's functional activity level.    Baseline  scored 33/100 on 11/21/2019;            Plan - 12/08/19 1204    Clinical Impression Statement  Patient reports compliance with HEP and demonstrates good participation during session. Patient reported no dizziness with VOR and active eye movement between two targets exercises. However, patient reported dizziness with ball toss over shoulder activity. Will plan on repeating this activity and working on progressions as well as body wall rolls next session. Patient would benefit from continued PT services to further address goals and functional deficits.    Personal Factors and Comorbidities  Comorbidity 3+;Time since onset of injury/illness/exacerbation    Comorbidities  DM, HTN, hyperthyroidism    Examination-Activity Limitations  Bathing;Bend;Stairs;Bed Mobility;Stand    Stability/Clinical Decision Making  Evolving/Moderate complexity    Rehab Potential  Good    PT Frequency  1x / week    PT Duration  8 weeks    PT Treatment/Interventions  Canalith Repostioning;Gait training;Stair training;Therapeutic exercise;Balance training;Neuromuscular  re-education;Patient/family education;Vestibular    PT Next Visit Plan  review VOR X 1; try FT and semi-tandem stance with head turns, ambulation with head turns    PT Home Exercise Plan  VOR X 1 in sitting and Issued MedBridge HEP template- feet together and semi tandem progressions on firm surface with horizontal and vertical head turns.    Consulted and Agree with Plan of Care  Patient       Patient will benefit from skilled therapeutic intervention in order to improve the following deficits and impairments:  Decreased balance, Dizziness  Visit Diagnosis: Dizziness and giddiness  Unsteadiness on feet     Problem List Patient Active Problem List   Diagnosis Date Noted  . Vertigo 10/10/2019  . Joint pain in both hands 10/10/2019  . Benign positional vertigo 09/08/2019  . Orthostasis 09/08/2019  . Cervical spondylosis with radiculopathy 09/08/2019  . Adhesive capsulitis of shoulder 05/01/2019  . Displacement of lumbar intervertebral disc without myelopathy 05/01/2019  . Osteoarthritis of spinal facet joint 05/01/2019  . Primary localized osteoarthritis of pelvic region and thigh 05/01/2019  . Type 2 diabetes mellitus with diabetic neuropathy, unspecified ([place]) 04/17/2019  . Hyperthyroidism 10/27/2018  . Elevated alkaline phosphatase measurement 11/12/2017  . Elevated liver enzymes 10/10/2017  . Right lumbar radiculopathy 09/22/2017  . Esophagitis   . Columnar-lined esophagus   . Gastric polyp   . Hiatal hernia   . Weakness 08/26/2017  . Dysphagia 05/24/2017  . Generalized weakness 05/24/2017  . Back pain 05/24/2017  . Left lumbar radiculopathy 12/30/2016  . Skin neoplasm 06/06/2016  . Venous (peripheral) insufficiency 06/06/2016  . Right hip pain 12/12/2015  . Insomnia secondary to anxiety 12/12/2015  . History of lumbar laminectomy for spinal cord decompression 08/18/2015  . Extrinsic asthma, unspecified 02/21/2014  . History of tobacco abuse 02/21/2014  . Screening  for osteoporosis 02/09/2014  . Pulmonary disease 02/09/2014  . Encounter for smoking cessation counseling 04/27/2013  . Arthritis 04/27/2013  . DM type 2 (diabetes mellitus, type 2) ([place]) 04/11/2013  . HTN (hypertension), benign 04/11/2013  . HLD (hyperlipidemia) 04/11/2013  . Anemia 06/13/2012  . Left leg pain 06/13/2012   Lady Deutscher PT, DPT [phone] Lady Deutscher 12/08/2019, 12:04 PM  [place] MAIN [facility] SERVICES [address] [place], [place], [address] Phone: [phone]  Fax:  [phone]  Name: Maria Hinton MRN: [id number] Date of Birth: Aug 22, 1945

## 2019-12-12 ENCOUNTER — Ambulatory Visit: Payer: Medicare HMO | Admitting: Physical Therapy

## 2019-12-15 ENCOUNTER — Other Ambulatory Visit: Payer: Self-pay | Admitting: Internal Medicine

## 2019-12-18 NOTE — Telephone Encounter (Signed)
Refill request for xanax, last seen 10-10-19, last filled 08-25-19.  Please advise.

## 2019-12-19 ENCOUNTER — Ambulatory Visit: Payer: Medicare HMO | Admitting: Physical Therapy

## 2019-12-19 ENCOUNTER — Other Ambulatory Visit: Payer: Self-pay

## 2019-12-19 ENCOUNTER — Encounter
Admission: RE | Admit: 2019-12-19 | Discharge: 2019-12-19 | Disposition: A | Discharge status: Home / Self Care | Payer: Medicare HMO | Source: Ambulatory Visit | Attending: Endocrinology | Admitting: Endocrinology

## 2019-12-19 DIAGNOSIS — E0590 Thyrotoxicosis, unspecified without thyrotoxic crisis or storm: Secondary | ICD-10-CM

## 2019-12-19 MED ORDER — SODIUM IODIDE I-123 7.4 MBQ CAPS
447.0000 | ORAL_CAPSULE | Freq: Once | ORAL | Status: AC
  Administered 2019-12-19: 447 via ORAL

## 2019-12-20 ENCOUNTER — Other Ambulatory Visit: Payer: Self-pay | Admitting: Endocrinology

## 2019-12-20 ENCOUNTER — Encounter
Admission: RE | Admit: 2019-12-20 | Discharge: 2019-12-20 | Disposition: A | Discharge status: Home / Self Care | Payer: Medicare HMO | Source: Ambulatory Visit | Attending: Endocrinology | Admitting: Endocrinology

## 2019-12-20 DIAGNOSIS — E0590 Thyrotoxicosis, unspecified without thyrotoxic crisis or storm: Secondary | ICD-10-CM

## 2019-12-22 LAB — HM DIABETES EYE EXAM

## 2019-12-26 ENCOUNTER — Ambulatory Visit: Payer: Medicare HMO | Admitting: Physical Therapy

## 2019-12-27 ENCOUNTER — Other Ambulatory Visit: Payer: Self-pay | Admitting: Endocrinology

## 2020-01-01 ENCOUNTER — Other Ambulatory Visit: Payer: Self-pay | Admitting: Endocrinology

## 2020-01-02 ENCOUNTER — Other Ambulatory Visit: Payer: Self-pay

## 2020-01-02 ENCOUNTER — Encounter
Admission: RE | Admit: 2020-01-02 | Discharge: 2020-01-02 | Disposition: A | Discharge status: Home / Self Care | Payer: Medicare HMO | Source: Ambulatory Visit | Attending: Endocrinology | Admitting: Endocrinology

## 2020-01-02 ENCOUNTER — Ambulatory Visit: Payer: Medicare HMO | Admitting: Physical Therapy

## 2020-01-02 DIAGNOSIS — E0590 Thyrotoxicosis, unspecified without thyrotoxic crisis or storm: Secondary | ICD-10-CM | POA: Diagnosis not present

## 2020-01-02 MED ORDER — SODIUM IODIDE I 131 CAPSULE
25.6540 | Freq: Once | INTRAVENOUS | Status: AC | PRN
  Administered 2020-01-02: 25.654 via ORAL

## 2020-01-05 ENCOUNTER — Encounter: Payer: Self-pay | Admitting: Internal Medicine

## 2020-01-20 ENCOUNTER — Other Ambulatory Visit: Payer: Self-pay | Admitting: Endocrinology

## 2020-01-27 ENCOUNTER — Other Ambulatory Visit: Payer: Self-pay | Admitting: Internal Medicine

## 2020-01-30 ENCOUNTER — Encounter: Payer: Self-pay | Admitting: Endocrinology

## 2020-01-30 ENCOUNTER — Ambulatory Visit: Payer: Medicare HMO | Admitting: Endocrinology

## 2020-01-30 ENCOUNTER — Other Ambulatory Visit: Payer: Self-pay

## 2020-01-30 VITALS — BP 152/80 | HR 67 | Ht 64.0 in | Wt 159.2 lb

## 2020-01-30 DIAGNOSIS — E0590 Thyrotoxicosis, unspecified without thyrotoxic crisis or storm: Secondary | ICD-10-CM | POA: Diagnosis not present

## 2020-01-30 LAB — T4, FREE: Free T4: 1.74 ng/dL — ABNORMAL HIGH (ref 0.60–1.60)

## 2020-01-30 NOTE — Progress Notes (Signed)
Patient ID: Maria Hinton, female   DOB: 1945-11-22, 74 y.o.   MRN: [id number]                                                                                                              Reason for Appointment:  Hyperthyroidism, follow-up visit   Referring physician: Derrel Nip      History of Present Illness:   Prior history:  For several months before her consultation she had symptoms of weakness, palpitations, shakiness, feeling sweaty in am, nervousness, and fatigue.  Also would feel a strong heartbeat in her ears She had been on atenolol for hypertension also She had a decreased appetite and started losing weight late last summer  The patient had lost about  26 lbs since these symptoms started   RECENT history:  She started taking methimazole in 09/2017 for her hyperthyroidism On her 10/2017 visit she was taking 10 mg twice daily because of hyperthyroidism she was given 30 mg daily  In follow-up she has been requiring periodically reduced doses of her methimazole  However since late 2019 she appears to be needing progressively higher doses of methimazole  On her visit in 12/19 she was having fatigue and weakness; did not complain of feeling shaky or having palpitations, no weight loss However her thyroid levels were significantly high with 7.5 mg of methimazole The dose was increased  RECENT history:  She has been on methimazole on and off  Her methimazole was transiently stopped on 01/17/2019 However on her visit on 02/28/2019 she was having symptoms of feeling hot and sweaty, also a little more tired and weak TSH was then suppressed and her free T4 was relatively high at 1.3 and she was started back on methimazole Although the methimazole was tapered off subsequently she again had a relapse in 5/21 without methimazole  She had I-131 treatment on 01/02/2020 with 25 mCi; the dose was prescribed by the radiologist She has not taken methimazole since the treatment  She feels  fairly good with no change in weight, no fatigue and no shakiness or weakness/fatigue Labs are pending   Wt Readings from Last 3 Encounters:  01/30/20 159 lb 3.2 oz (72.2 kg)  12/01/19 159 lb 12.8 oz (72.5 kg)  10/16/19 166 lb 6.4 oz (75.5 kg)     Thyrotropin receptor antibody slightly above normal as of 4/21   Thyroid function tests as follows:     Lab Results  Component Value Date   FREET4 1.82 (H) 11/27/2019   FREET4 0.96 10/11/2019   FREET4 0.94 08/30/2019   T3FREE 4.2 11/27/2019   T3FREE 2.8 10/11/2019   T3FREE 3.1 08/30/2019   TSH <0.01 (L) 11/27/2019   TSH 1.94 10/11/2019   TSH 3.04 08/30/2019    Lab Results  Component Value Date   THYROTRECAB 2.08 (H) 10/11/2019   THYROTRECAB 1.66 07/13/2019   THYROTRECAB 1.79 (H) 03/30/2019     Allergies as of 01/30/2020      Reactions   Vicodin [hydrocodone-acetaminophen] Other (See Comments)   Insomnia  Medication List       Accurate as of January 30, 2020  1:23 PM. If you have any questions, ask your nurse or doctor.        albuterol 108 (90 Base) MCG/ACT inhaler Commonly known as: VENTOLIN HFA Inhale 2 puffs into the lungs every 6 (six) hours as needed for wheezing.   ALPRAZolam 0.25 MG tablet Commonly known as: XANAX TAKE 1 TABLET BY MOUTH TWICE A DAY AS NEEDED   aspirin EC 81 MG tablet Take 81 mg by mouth daily.   atenolol 50 MG tablet Commonly known as: TENORMIN TAKE 1 TABLET BY MOUTH TWICE A DAY   atorvastatin 40 MG tablet Commonly known as: LIPITOR TAKE 1 TABLET BY MOUTH DAILY AT 6 PM.   BD Pen Needle Nano U/F 32G X 4 MM Misc Generic drug: Insulin Pen Needle USE DAILY AS DIRECTED WITH LEVEMIR   gabapentin 100 MG capsule Commonly known as: NEURONTIN Take 2 capsules (200 mg total) by mouth 3 (three) times daily.   Levemir FlexTouch 100 UNIT/ML FlexPen Generic drug: insulin detemir INJECT 15 UNITS INTO THE SKIN DAILY AT 10 PM.   losartan 100 MG tablet Commonly known as: COZAAR Take 1  tablet (100 mg total) by mouth daily.   meloxicam 15 MG tablet Commonly known as: MOBIC TAKE 1 TABLET BY MOUTH EVERY DAY   metFORMIN 1000 MG tablet Commonly known as: GLUCOPHAGE TAKE 1 TABLET BY MOUTH 2 TIMES DAILY WITH A MEAL.   methimazole 5 MG tablet Commonly known as: TAPAZOLE TALE 1/2 TABLET BY MOUTH ONCE DAILY.   omeprazole 40 MG capsule Commonly known as: PRILOSEC TAKE 1 CAPSULE BY MOUTH EVERY DAY   OneTouch Delica Lancets 82U Misc 1 application by Does not apply route 2 (two) times daily. Use to check blood sugar up to two times daily. E11.69   OneTouch Verio test strip Generic drug: glucose blood USE AS INSTRUCTED   traMADol 50 MG tablet Commonly known as: ULTRAM TAKE 1 TABLET BY MOUTH EVERY 6 HOURS AS NEEDED FOR SPINAL STENOSIS PAIN           Past Medical History:  Diagnosis Date  . Actinic keratoses    follows with dermatology   . Arthritis   . Asthma   . Chronic back pain    DDD,herniated disc/spondylosis/radiculopathy  . COPD (chronic obstructive pulmonary disease) ([place])   . Diabetes mellitus    1980s  . Early cataracts, bilateral    Dr. Ruthine Dose - [facility]  . GERD (gastroesophageal reflux disease)    takes Omeprazole daily  . Hemorrhoids   . History of colonic polyps    colonoscopy 04/2012 - Dr Candace Cruise  . Hyperlipidemia    takes Pravastatin daily  . Hypertension    takes Metoprolol/HCTZ daily  . Hyperthyroidism   . Insomnia   . Joint pain    fingers  . Joint swelling   . Pneumonia    x 2 ;in Dec 2010/2011  . PONV (postoperative nausea and vomiting)   . Right leg weakness     Past Surgical History:  Procedure Laterality Date  . ABDOMINAL HYSTERECTOMY     at age 35  . BACK SURGERY  20+yrs ago  . CATARACT EXTRACTION W/PHACO Left 01/04/2019   Procedure: CATARACT EXTRACTION PHACO AND INTRAOCULAR LENS PLACEMENT ([place])  LEFT;  Surgeon: Leandrew Koyanagi, MD;  Location: [place];  Service: Ophthalmology;  Laterality:  Left;  GIVE IV ZOFRAN  . CATARACT EXTRACTION W/PHACO Right 02/01/2019  Procedure: CATARACT EXTRACTION PHACO AND INTRAOCULAR LENS PLACEMENT ([place])  RIGHT DIABETIC;  Surgeon: Leandrew Koyanagi, MD;  Location: [place];  Service: Ophthalmology;  Laterality: Right;  DIABETIC GIVE IV ZOFRAN  . COLONOSCOPY    . COLONOSCOPY N/A 05/13/2015   Procedure: COLONOSCOPY;  Surgeon: Hulen Luster, MD;  Location: [facility] ENDOSCOPY;  Service: Gastroenterology;  Laterality: N/A;  . ESOPHAGOGASTRODUODENOSCOPY  2011  . ESOPHAGOGASTRODUODENOSCOPY (EGD) WITH PROPOFOL N/A 09/02/2017   Procedure: ESOPHAGOGASTRODUODENOSCOPY (EGD) WITH PROPOFOL;  Surgeon: Virgel Manifold, MD;  Location: [facility] ENDOSCOPY;  Service: Endoscopy;  Laterality: N/A;  . FOOT SURGERY  2011   bunionectomy and knot removed from bottom of both feet  . JOINT REPLACEMENT Left 2014   hip  Kraskinski  . LUMBAR LAMINECTOMY/DECOMPRESSION MICRODISCECTOMY  09/10/2011   Procedure: LUMBAR LAMINECTOMY/DECOMPRESSION MICRODISCECTOMY 1 LEVEL;  Surgeon: Hosie Spangle, MD;  Location: [place] NEURO ORS;  Service: Neurosurgery;  Laterality: Right;  RIGHT Lumbar  Laminotomy and microdiskectomy Lumbar Four-Five  . LUMBAR LAMINECTOMY/DECOMPRESSION MICRODISCECTOMY N/A 06/26/2015   Procedure: LUMBAR LAMINECTOMY/DECOMPRESSION MICRODISCECTOMY 1 LEVEL;  Surgeon: Jovita Gamma, MD;  Location: [place] NEURO ORS;  Service: Neurosurgery;  Laterality: N/A;  L3 and L4 Laminectomies  . MELANOMA EXCISION  2011   on nose/back on neck  . THORACIC DISC SURGERY  at age 40 and 87    mass of veins that had gotten into muscle and wrapped around rib;3 ribs also removed from left side  . TONSILLECTOMY  as a child   and adenoids   . TOTAL HIP ARTHROPLASTY Left 2014   Dr. Mack Guise  . TUBAL LIGATION    . VAGINA SURGERY     vaginal wall ruptured     Family History  Problem Relation Age of Onset  . Heart disease Brother        valve replacement  . COPD Brother   . COPD Mother         Emphysema  . Diabetes Mother   . Other Mother        Died in house fire at age 3.  Mar[place] Kitchen Heart disease Father   . Seizures Father   . Cancer Father        Lung cancer with brain metastasis  . Asthma Brother   . COPD Son 38  . Cancer Other        aunt ? maternal vs paternal side lung cancer   . Diabetes Maternal Grandmother   . Anesthesia problems Neg Hx   . Hypotension Neg Hx   . Malignant hyperthermia Neg Hx   . Pseudochol deficiency Neg Hx   . Thyroid disease Neg Hx   . Breast cancer Neg Hx     Social History:  reports that she quit smoking about 3 years ago. Her smoking use included cigarettes. She has a 5.00 pack-year smoking history. She has never used smokeless tobacco. She reports that she does not drink alcohol and does not use drugs.  Allergies:  Allergies  Allergen Reactions  . Vicodin [Hydrocodone-Acetaminophen] Other (See Comments)    Insomnia      Review of Systems  She is on basal insulin with Levemir and Metformin for diabetes and followed by PCP A1c as follows   Lab Results  Component Value Date   HGBA1C 7.8 (H) 08/30/2019   HGBA1C 7.4 (H) 04/14/2019   HGBA1C 6.9 (H) 10/26/2018   Lab Results  Component Value Date   MICROALBUR 0.7 04/14/2019   LDLCALC 45 04/14/2019   CREATININE 0.98 08/30/2019  Examination:   BP (!) 152/80 (BP Location: Left Arm, Patient Position: Sitting, Cuff Size: Large)   Pulse 67   Ht 5\' 4"  (1.626 m)   Wt 159 lb 3.2 oz (72.2 kg)   SpO2 95%   BMI 27.33 kg/m   Thyroid not palpable No tremor of hands Triceps reflex on the left is fairly normal, slightly slow relaxation present    Assessment/Plan:   Hyperthyroidism secondary to Graves' disease, on antithyroid drug treatment since 09/2017 No history of goiter  She was on  methimazole but continued to have relapses when this was tapered and has had I-131 treatment now  Clinically has done well and is now on methimazole about 1 month after her I-131 treatment  She looks euthyroid As before no thyroid enlargement present  Labs to be checked today and further treatment decided, will have her come back in another month for follow-up Likely will need levothyroxine supplementation at that time; she will call if she has any unusual fatigue before that  Elayne Snare 01/30/2020, 1:23 PM    Note: This office note was prepared with Dragon voice recognition system technology. Any transcriptional errors that result from this process are unintentional.

## 2020-01-30 NOTE — Progress Notes (Signed)
Thyroid level is still high but since she is not having any symptoms will wait till her next visit to allow radioactive iodine to take effect

## 2020-02-20 ENCOUNTER — Encounter: Payer: Self-pay | Admitting: Internal Medicine

## 2020-02-20 ENCOUNTER — Other Ambulatory Visit: Payer: Self-pay

## 2020-02-20 ENCOUNTER — Ambulatory Visit: Payer: Medicare HMO | Admitting: Internal Medicine

## 2020-02-20 VITALS — BP 130/78 | HR 65 | Temp 98.1°F | Resp 14 | Ht 64.0 in | Wt 155.6 lb

## 2020-02-20 DIAGNOSIS — Z86010 Personal history of colonic polyps: Secondary | ICD-10-CM | POA: Diagnosis not present

## 2020-02-20 DIAGNOSIS — M21371 Foot drop, right foot: Secondary | ICD-10-CM | POA: Diagnosis not present

## 2020-02-20 DIAGNOSIS — E538 Deficiency of other specified B group vitamins: Secondary | ICD-10-CM

## 2020-02-20 DIAGNOSIS — Z794 Long term (current) use of insulin: Secondary | ICD-10-CM

## 2020-02-20 DIAGNOSIS — I10 Essential (primary) hypertension: Secondary | ICD-10-CM

## 2020-02-20 DIAGNOSIS — E1140 Type 2 diabetes mellitus with diabetic neuropathy, unspecified: Secondary | ICD-10-CM | POA: Diagnosis not present

## 2020-02-20 DIAGNOSIS — E7800 Pure hypercholesterolemia, unspecified: Secondary | ICD-10-CM

## 2020-02-20 DIAGNOSIS — R748 Abnormal levels of other serum enzymes: Secondary | ICD-10-CM

## 2020-02-20 LAB — MICROALBUMIN / CREATININE URINE RATIO
Creatinine,U: 100.6 mg/dL
Microalb Creat Ratio: 1.8 mg/g (ref 0.0–30.0)
Microalb, Ur: 1.8 mg/dL (ref 0.0–1.9)

## 2020-02-20 NOTE — Assessment & Plan Note (Addendum)
a1c unchanged and still above goal .  Has not been checking sugars due to recent move to son's home. .  Taking levemir 15 units,  Losartan , atorvastatin and gabapentin for neuropathy.  She has had  Progression of  bilateral pain at rest described as sharp and intermittent  and now developing foot drop  On the right .   Has not seen neurology yet.  Referral in progress

## 2020-02-20 NOTE — Progress Notes (Addendum)
Subjective:  Patient ID: Maria Hinton, female    DOB: [birth year]  Age: 74 y.o. MRN: [id number]  CC: The primary encounter diagnosis was Personal history of colonic polyps. Diagnoses of Foot drop, right foot, Type 2 diabetes mellitus with diabetic neuropathy, with long-term current use of insulin (Toast), HTN (hypertension), benign, Pure hypercholesterolemia, Elevated liver enzymes, and B12 deficiency were also pertinent to this visit.  HPI Maria Hinton presents for diabetes follow  This visit occurred during the SARS-CoV-2 public health emergency.  Safety protocols were in place, including screening questions prior to the visit, additional usage of staff PPE, and extensive cleaning of exam room while observing appropriate contact time as indicated for disinfecting solutions.   Cc: she has been having increased bilateral foot pain and new onset right foot drop.  Toes on right foot are too weak to extend,  And the ankle is weak so dorsiflexion is worse.  She is stumbling over the right foot   histroy fo 3 prior lumbar surgeries,  Last one ten years ago,  And she denies any chronic back pain currently,  Has  diabetic neuroapthy managed wit hgabapentin   Has moved in with other son in his trailer. .  Very happy because he is gone most of the days and nights,  smokes in his bedroom or outside  Not near her.  He has 2 dogs  That are keeping her company since her dog was euthanized last August   DM:  Not checking sugars since she moved in with son.   Hyperthyroid:  Under treatment by Dwyane Dee.  Received XRT did not help ,  Still losing weight , thyroid still overactive  Either liqiud or soft . No racing heart,  But bowels move several times daily , usually soft or liquid.  Solid 1-2 times per week  Due for 5 yr follow up colonoscopy in November. No contact yet Verdie Shire did the previous one which was normal in [id number]  Outpatient Medications Prior to Visit  Medication Sig Dispense Refill  . albuterol  (PROVENTIL HFA;VENTOLIN HFA) 108 (90 Base) MCG/ACT inhaler Inhale 2 puffs into the lungs every 6 (six) hours as needed for wheezing. 6.7 g 11  . ALPRAZolam (XANAX) 0.25 MG tablet TAKE 1 TABLET BY MOUTH TWICE A DAY AS NEEDED 60 tablet 2  . aspirin EC 81 MG tablet Take 81 mg by mouth daily.    Marland Kitchen atenolol (TENORMIN) 50 MG tablet TAKE 1 TABLET BY MOUTH TWICE A DAY 180 tablet 1  . atorvastatin (LIPITOR) 40 MG tablet TAKE 1 TABLET BY MOUTH DAILY AT 6 PM. 90 tablet 3  . BD PEN NEEDLE NANO U/F 32G X 4 MM MISC USE DAILY AS DIRECTED WITH LEVEMIR 100 each 1  . gabapentin (NEURONTIN) 100 MG capsule Take 2 capsules (200 mg total) by mouth 3 (three) times daily. 180 capsule 3  . LEVEMIR FLEXTOUCH 100 UNIT/ML FlexPen INJECT 15 UNITS INTO THE SKIN DAILY AT 10 PM. 15 mL 2  . losartan (COZAAR) 100 MG tablet Take 1 tablet (100 mg total) by mouth daily. 90 tablet 0  . metFORMIN (GLUCOPHAGE) 1000 MG tablet TAKE 1 TABLET BY MOUTH 2 TIMES DAILY WITH A MEAL. 180 tablet 1  . omeprazole (PRILOSEC) 40 MG capsule TAKE 1 CAPSULE BY MOUTH EVERY DAY 90 capsule 3  . OneTouch Delica Lancets 17C MISC 1 application by Does not apply route 2 (two) times daily. Use to check blood sugar up to two times daily. E11.69  100 each 11  . ONETOUCH VERIO test strip USE AS INSTRUCTED 100 strip 12  . traMADol (ULTRAM) 50 MG tablet TAKE 1 TABLET BY MOUTH EVERY 6 HOURS AS NEEDED FOR SPINAL STENOSIS PAIN 120 tablet 2  . methimazole (TAPAZOLE) 5 MG tablet TALE 1/2 TABLET BY MOUTH ONCE DAILY. 45 tablet 1  . meloxicam (MOBIC) 15 MG tablet TAKE 1 TABLET BY MOUTH EVERY DAY (Patient not taking: Reported on 02/20/2020) 30 tablet 0   No facility-administered medications prior to visit.    Review of Systems;  Patient denies headache, fevers, malaise, unintentional weight loss, skin rash, eye pain, sinus congestion and sinus pain, sore throat, dysphagia,  hemoptysis , cough, dyspnea, wheezing, chest pain, palpitations, orthopnea, edema, abdominal pain,  nausea, melena, diarrhea, constipation, flank pain, dysuria, hematuria, urinary  Frequency, nocturia, numbness, tingling, seizures,  Focal weakness, Loss of consciousness,  Tremor, insomnia, depression, anxiety, and suicidal ideation.      Objective:  BP 130/78 (BP Location: Left Arm, Patient Position: Sitting, Cuff Size: Normal)   Pulse 65   Temp 98.1 F (36.7 C) (Oral)   Resp 14   Ht 5\' 4"  (1.626 m)   Wt 155 lb 9.6 oz (70.6 kg)   SpO2 98%   BMI 26.71 kg/m   BP Readings from Last 3 Encounters:  02/20/20 130/78  01/30/20 (!) 152/80  12/01/19 (!) 150/82    Wt Readings from Last 3 Encounters:  02/20/20 155 lb 9.6 oz (70.6 kg)  01/30/20 159 lb 3.2 oz (72.2 kg)  12/01/19 159 lb 12.8 oz (72.5 kg)    General appearance: alert, cooperative and appears stated age Ears: normal TM's and external ear canals both ears Throat: lips, mucosa, and tongue normal; teeth and gums normal Neck: no adenopathy, no carotid bruit, supple, symmetrical, trachea midline and thyroid not enlarged, symmetric, no tenderness/mass/nodules Back: symmetric, no curvature. ROM normal. No CVA tenderness. Lungs: clear to auscultation bilaterally Heart: regular rate and rhythm, S1, S2 normal, no murmur, click, rub or gallop Abdomen: soft, non-tender; bowel sounds normal; no masses,  no organomegaly Pulses: 2+ and symmetric Skin: Skin color, texture, turgor normal. No rashes or lesions MSK/neuro:  Right  foot dorsiflexors weak.  Plantarflexion WNL.   Lymph nodes: Cervical, supraclavicular, and axillary nodes normal.  Lab Results  Component Value Date   HGBA1C 7.8 (H) 02/20/2020   HGBA1C 7.8 (H) 08/30/2019   HGBA1C 7.4 (H) 04/14/2019    Lab Results  Component Value Date   CREATININE 0.83 02/20/2020   CREATININE 0.98 08/30/2019   CREATININE 0.87 04/14/2019    Lab Results  Component Value Date   WBC 6.4 04/21/2018   HGB 14.5 04/21/2018   HCT 42.7 04/21/2018   PLT 324.0 04/21/2018   GLUCOSE 164 (H)  02/20/2020   CHOL 114 04/14/2019   TRIG 132.0 04/14/2019   HDL 42.90 04/14/2019   LDLDIRECT 67.0 02/20/2020   LDLCALC 45 04/14/2019   ALT 12 02/20/2020   AST 13 02/20/2020   NA 140 02/20/2020   K 3.7 02/20/2020   CL 105 02/20/2020   CREATININE 0.83 02/20/2020   BUN 10 02/20/2020   CO2 24 02/20/2020   TSH <0.01 (L) 11/27/2019   INR 0.88 02/17/2018   HGBA1C 7.8 (H) 02/20/2020   MICROALBUR 1.8 02/20/2020    NM RAI Therapy For Hyperthyroidism  Result Date: 01/02/2020 CLINICAL DATA:  Hyperthyroidism EXAM: RADIOACTIVE IODINE THERAPY FOR HYPERTHYROIDISM COMPARISON:  Nuclear medicine thyroid uptake 12/20/2019. TECHNIQUE: Radioactive iodine prescribed by Dr. Clovis Riley. The risks and  benefits of radioactive iodine therapy were discussed with the patient in detail by Dr. Register. Alternative therapies were also mentioned. Radiation safety was discussed with the patient, including how to protect the general public from exposure. There were no barriers to communication. Written consent was obtained. The patient then received a capsule containing the radiopharmaceutical. The patient will follow-up with the referring physician. RADIOPHARMACEUTICALS:  25.654 mCi I-131 sodium iodide orally IMPRESSION: Per oral administration of I-131 sodium iodide for the treatment of hyperthyroidism. Electronically Signed   By: Kerby Moors M.D.   On: 01/02/2020 11:44    Assessment & Plan:   Problem List Items Addressed This Visit      Unprioritized   RESOLVED: DM type 2 (diabetes mellitus, type 2) ([place])   Relevant Orders   Ambulatory referral to Neurology   Hemoglobin A1c (Completed)   Microalbumin / creatinine urine ratio (Completed)   Comprehensive metabolic panel (Completed)   Direct LDL (Completed)   HLD (hyperlipidemia)    Tolerating lipitor .  Direct LDL ordered today  Lab Results  Component Value Date   CHOL 114 04/14/2019   HDL 42.90 04/14/2019   LDLCALC 45 04/14/2019   LDLDIRECT 81.0 07/07/2017    TRIG 132.0 04/14/2019   CHOLHDL 3 04/14/2019         HTN (hypertension), benign    Well controlled on current regimen. Renal function stable, no changes today.      B12 deficiency    New onset,  May be contributing to neuropathy.  b12 deficiency diagnosed. patient asked to return for weekly B12 injections   Lab Results  Component Value Date   VITAMINB12 147 (L) 02/20/2020          Elevated liver enzymes    Recurrent .  [place] of liver in 2019 was normal.  Occurs when thyroid is hyperactive   Lab Results  Component Value Date   ALT 12 02/20/2020   AST 13 02/20/2020   ALKPHOS 129 (H) 02/20/2020   BILITOT 0.5 02/20/2020     Lab Results  Component Value Date   ALT 12 02/20/2020   AST 13 02/20/2020   ALKPHOS 129 (H) 02/20/2020   BILITOT 0.5 02/20/2020          Other Visit Diagnoses    Personal history of colonic polyps    -  Primary   Relevant Orders   Ambulatory referral to Gastroenterology   Foot drop, right foot       Relevant Orders   Ambulatory referral to Neurology   B12 and Folate Panel (Completed)      I provided  40 minutes of  face-to-face time during this encounter reviewing patient's current problems and past surgeries, labs and imaging studies, providing counseling on the above mentioned problems , and coordination  of care . I have discontinued Aneka F. Selley's meloxicam. I am also having her maintain her aspirin EC, albuterol, omeprazole, OneTouch Verio, OneTouch Delica Lancets 59R, atorvastatin, atenolol, gabapentin, losartan, BD Pen Needle Nano U/F, metFORMIN, traMADol, ALPRAZolam, methimazole, and Levemir FlexTouch.  No orders of the defined types were placed in this encounter.   Medications Discontinued During This Encounter  Medication Reason  . meloxicam (MOBIC) 15 MG tablet     Follow-up: No follow-ups on file.   Crecencio Mc, MD

## 2020-02-20 NOTE — Assessment & Plan Note (Signed)
Well controlled on current regimen. Renal function stable, no changes today. 

## 2020-02-20 NOTE — Patient Instructions (Signed)
Your foot pain and right "foot drop" is due to diabetic neuropathy.    You may increase your gabapentin by 100 mg per dose every 3 days as needed to control your pain    You are being referred to a neurologist in [place] to evaluate your foot drop.  You will likely receive a brace for your foot to keep it properly positioned so you can walk   Referral to [facility] for your colonoscopy is also in progress   Peripheral Neuropathy Peripheral neuropathy is a type of nerve damage. It affects nerves that carry signals between the spinal cord and the arms, legs, and the rest of the body (peripheral nerves). It does not affect nerves in the spinal cord or brain. In peripheral neuropathy, one nerve or a group of nerves may be damaged. Peripheral neuropathy is a broad category that includes many specific nerve disorders, like diabetic neuropathy, hereditary neuropathy, and carpal tunnel syndrome. What are the causes? This condition may be caused by:  Diabetes. This is the most common cause of peripheral neuropathy.  Nerve injury.  Pressure or stress on a nerve that lasts a long time.  Lack (deficiency) of B vitamins. This can result from alcoholism, poor diet, or a restricted diet.  Infections.  Autoimmune diseases, such as rheumatoid arthritis and systemic lupus erythematosus.  Nerve diseases that are passed from parent to child (inherited).  Some medicines, such as cancer medicines (chemotherapy).  Poisonous (toxic) substances, such as lead and mercury.  Too little blood flowing to the legs.  Kidney disease.  Thyroid disease. In some cases, the cause of this condition is not known. What are the signs or symptoms? Symptoms of this condition depend on which of your nerves is damaged. Common symptoms include:  Loss of feeling (numbness) in the feet, hands, or both.  Tingling in the feet, hands, or both.  Burning pain.  Very sensitive skin.  Weakness.  Not being able to  move a part of the body (paralysis).  Muscle twitching.  Clumsiness or poor coordination.  Loss of balance.  Not being able to control your bladder.  Feeling dizzy.  Sexual problems. How is this diagnosed? Diagnosing and finding the cause of peripheral neuropathy can be difficult. Your health care provider will take your medical history and do a physical exam. A neurological exam will also be done. This involves checking things that are affected by your brain, spinal cord, and nerves (nervous system). For example, your health care provider will check your reflexes, how you move, and what you can feel. You may have other tests, such as:  Blood tests.  Electromyogram (EMG) and nerve conduction tests. These tests check nerve function and how well the nerves are controlling the muscles.  Imaging tests, such as CT scans or MRI to rule out other causes of your symptoms.  Removing a small piece of nerve to be examined in a lab (nerve biopsy). This is rare.  Removing and examining a small amount of the fluid that surrounds the brain and spinal cord (lumbar puncture). This is rare. How is this treated? Treatment for this condition may involve:  Treating the underlying cause of the neuropathy, such as diabetes, kidney disease, or vitamin deficiencies.  Stopping medicines that can cause neuropathy, such as chemotherapy.  Medicine to relieve pain. Medicines may include: ? Prescription or over-the-counter pain medicine. ? Antiseizure medicine. ? Antidepressants. ? Pain-relieving patches that are applied to painful areas of skin.  Surgery to relieve pressure on a  nerve or to destroy a nerve that is causing pain.  Physical therapy to help improve movement and balance.  Devices to help you move around (assistive devices). Follow these instructions at home: Medicines  Take over-the-counter and prescription medicines only as told by your health care provider. Do not take any other  medicines without first asking your health care provider.  Do not drive or use heavy machinery while taking prescription pain medicine. Lifestyle   Do not use any products that contain nicotine or tobacco, such as cigarettes and e-cigarettes. Smoking keeps blood from reaching damaged nerves. If you need help quitting, ask your health care provider.  Avoid or limit alcohol. Too much alcohol can cause a vitamin B deficiency, and vitamin B is needed for healthy nerves.  Eat a healthy diet. This includes: ? Eating foods that are high in fiber, such as fresh fruits and vegetables, whole grains, and beans. ? Limiting foods that are high in fat and processed sugars, such as fried or sweet foods. General instructions   If you have diabetes, work closely with your health care provider to keep your blood sugar under control.  If you have numbness in your feet: ? Check every day for signs of injury or infection. Watch for redness, warmth, and swelling. ? Wear padded socks and comfortable shoes. These help protect your feet.  Develop a good support system. Living with peripheral neuropathy can be stressful. Consider talking with a mental health specialist or joining a support group.  Use assistive devices and attend physical therapy as told by your health care provider. This may include using a walker or a cane.  Keep all follow-up visits as told by your health care provider. This is important. Contact a health care provider if:  You have new signs or symptoms of peripheral neuropathy.  You are struggling emotionally from dealing with peripheral neuropathy.  Your pain is not well-controlled. Get help right away if:  You have an injury or infection that is not healing normally.  You develop new weakness in an arm or leg.  You fall frequently. Summary  Peripheral neuropathy is when the nerves in the arms, or legs are damaged, resulting in numbness, weakness, or pain.  There are many  causes of peripheral neuropathy, including diabetes, pinched nerves, vitamin deficiencies, autoimmune disease, and hereditary conditions.  Diagnosing and finding the cause of peripheral neuropathy can be difficult. Your health care provider will take your medical history, do a physical exam, and do tests, including blood tests and nerve function tests.  Treatment involves treating the underlying cause of the neuropathy and taking medicines to help control pain. Physical therapy and assistive devices may also help. This information is not intended to replace advice given to you by your health care provider. Make sure you discuss any questions you have with your health care provider. Document Revised: 06/04/2017 Document Reviewed: 08/31/2016 Elsevier Patient Education  2020 [employer].

## 2020-02-20 NOTE — Assessment & Plan Note (Signed)
Tolerating lipitor .  Direct LDL ordered today  Lab Results  Component Value Date   CHOL 114 04/14/2019   HDL 42.90 04/14/2019   LDLCALC 45 04/14/2019   LDLDIRECT 81.0 07/07/2017   TRIG 132.0 04/14/2019   CHOLHDL 3 04/14/2019

## 2020-02-21 ENCOUNTER — Telehealth (INDEPENDENT_AMBULATORY_CARE_PROVIDER_SITE_OTHER): Payer: Self-pay | Admitting: Gastroenterology

## 2020-02-21 ENCOUNTER — Other Ambulatory Visit (INDEPENDENT_AMBULATORY_CARE_PROVIDER_SITE_OTHER): Payer: Self-pay

## 2020-02-21 DIAGNOSIS — Z86010 Personal history of colonic polyps: Secondary | ICD-10-CM

## 2020-02-21 DIAGNOSIS — E538 Deficiency of other specified B group vitamins: Secondary | ICD-10-CM | POA: Insufficient documentation

## 2020-02-21 LAB — COMPREHENSIVE METABOLIC PANEL
ALT: 12 U/L (ref 0–35)
AST: 13 U/L (ref 0–37)
Albumin: 4.2 g/dL (ref 3.5–5.2)
Alkaline Phosphatase: 129 U/L — ABNORMAL HIGH (ref 39–117)
BUN: 10 mg/dL (ref 6–23)
CO2: 24 mEq/L (ref 19–32)
Calcium: 8.6 mg/dL (ref 8.4–10.5)
Chloride: 105 mEq/L (ref 96–112)
Creatinine, Ser: 0.83 mg/dL (ref 0.40–1.20)
GFR: 67.57 mL/min (ref >60.00)
Glucose, Bld: 164 mg/dL — ABNORMAL HIGH (ref 70–99)
Potassium: 3.7 mEq/L (ref 3.5–5.1)
Sodium: 140 mEq/L (ref 135–145)
Total Bilirubin: 0.5 mg/dL (ref 0.2–1.2)
Total Protein: 6.7 g/dL (ref 6.0–8.3)

## 2020-02-21 LAB — B12 AND FOLATE PANEL
Folate: 10.8 ng/mL (ref >5.9)
Vitamin B-12: 147 pg/mL — ABNORMAL LOW (ref 211–911)

## 2020-02-21 LAB — HEMOGLOBIN A1C: Hgb A1c MFr Bld: 7.8 % — ABNORMAL HIGH (ref 4.6–6.5)

## 2020-02-21 LAB — LDL CHOLESTEROL, DIRECT: Direct LDL: 67 mg/dL

## 2020-02-21 NOTE — Assessment & Plan Note (Signed)
New onset,  May be contributing to neuropathy.  b12 deficiency diagnosed. patient asked to return for weekly B12 injections   Lab Results  Component Value Date   VITAMINB12 147 (L) 02/20/2020

## 2020-02-21 NOTE — Addendum Note (Signed)
Addended by: Crecencio Mc on: 02/21/2020 08:03 PM   Modules accepted: Level of Service

## 2020-02-21 NOTE — Assessment & Plan Note (Signed)
Recurrent .  [place] of liver in 2019 was normal.  Occurs when thyroid is hyperactive   Lab Results  Component Value Date   ALT 12 02/20/2020   AST 13 02/20/2020   ALKPHOS 129 (H) 02/20/2020   BILITOT 0.5 02/20/2020     Lab Results  Component Value Date   ALT 12 02/20/2020   AST 13 02/20/2020   ALKPHOS 129 (H) 02/20/2020   BILITOT 0.5 02/20/2020

## 2020-02-21 NOTE — Progress Notes (Signed)
Gastroenterology Pre-Procedure Review  Request Date: Friday 05/10/20 Requesting Physician: Dr. Bonna Gains  PATIENT REVIEW QUESTIONS: The patient responded to the following health history questions as indicated:    1. Are you having any GI issues? no 2. Do you have a personal history of Polyps? yes (last colonoscopy was in November of 2016 pt states it was with Dr. Candace Cruise) 3. Do you have a family history of Colon Cancer or Polyps? no 4. Diabetes Mellitus? yes (takes insulin) and metformin 5. Joint replacements in the past 12 months?no 6. Major health problems in the past 3 months?Cataract surgery 12/2019 7. Any artificial heart valves, MVP, or defibrillator?no    MEDICATIONS & ALLERGIES:    Patient reports the following regarding taking any anticoagulation/antiplatelet therapy:   Plavix, Coumadin, Eliquis, Xarelto, Lovenox, Pradaxa, Brilinta, or Effient? no Aspirin? yes (81 mg daily)  Patient confirms/reports the following medications:  Current Outpatient Medications  Medication Sig Dispense Refill  . albuterol (PROVENTIL HFA;VENTOLIN HFA) 108 (90 Base) MCG/ACT inhaler Inhale 2 puffs into the lungs every 6 (six) hours as needed for wheezing. 6.7 g 11  . ALPRAZolam (XANAX) 0.25 MG tablet TAKE 1 TABLET BY MOUTH TWICE A DAY AS NEEDED 60 tablet 2  . aspirin EC 81 MG tablet Take 81 mg by mouth daily.    Marland Kitchen atenolol (TENORMIN) 50 MG tablet TAKE 1 TABLET BY MOUTH TWICE A DAY 180 tablet 1  . atorvastatin (LIPITOR) 40 MG tablet TAKE 1 TABLET BY MOUTH DAILY AT 6 PM. 90 tablet 3  . BD PEN NEEDLE NANO U/F 32G X 4 MM MISC USE DAILY AS DIRECTED WITH LEVEMIR 100 each 1  . gabapentin (NEURONTIN) 100 MG capsule Take 2 capsules (200 mg total) by mouth 3 (three) times daily. 180 capsule 3  . LEVEMIR FLEXTOUCH 100 UNIT/ML FlexPen INJECT 15 UNITS INTO THE SKIN DAILY AT 10 PM. 15 mL 2  . losartan (COZAAR) 100 MG tablet Take 1 tablet (100 mg total) by mouth daily. 90 tablet 0  . metFORMIN (GLUCOPHAGE) 1000 MG  tablet TAKE 1 TABLET BY MOUTH 2 TIMES DAILY WITH A MEAL. 180 tablet 1  . methimazole (TAPAZOLE) 5 MG tablet TALE 1/2 TABLET BY MOUTH ONCE DAILY. 45 tablet 1  . omeprazole (PRILOSEC) 40 MG capsule TAKE 1 CAPSULE BY MOUTH EVERY DAY 90 capsule 3  . OneTouch Delica Lancets 27P MISC 1 application by Does not apply route 2 (two) times daily. Use to check blood sugar up to two times daily. E11.69 100 each 11  . ONETOUCH VERIO test strip USE AS INSTRUCTED 100 strip 12  . traMADol (ULTRAM) 50 MG tablet TAKE 1 TABLET BY MOUTH EVERY 6 HOURS AS NEEDED FOR SPINAL STENOSIS PAIN 120 tablet 2   No current facility-administered medications for this visit.    Patient confirms/reports the following allergies:  Allergies  Allergen Reactions  . Vicodin [Hydrocodone-Acetaminophen] Other (See Comments)    Insomnia     No orders of the defined types were placed in this encounter.   AUTHORIZATION INFORMATION Primary Insurance: 1D#: Group #:  Secondary Insurance: 1D#: Group #:  SCHEDULE INFORMATION: Date: Friday 05/10/20 Time: Location:[facility]

## 2020-02-22 ENCOUNTER — Other Ambulatory Visit: Payer: Self-pay

## 2020-02-22 ENCOUNTER — Ambulatory Visit (INDEPENDENT_AMBULATORY_CARE_PROVIDER_SITE_OTHER): Payer: Medicare HMO

## 2020-02-22 ENCOUNTER — Other Ambulatory Visit: Payer: Self-pay | Admitting: Internal Medicine

## 2020-02-22 DIAGNOSIS — E538 Deficiency of other specified B group vitamins: Secondary | ICD-10-CM

## 2020-02-22 MED ORDER — CYANOCOBALAMIN 1000 MCG/ML IJ SOLN
1000.0000 ug | Freq: Once | INTRAMUSCULAR | Status: AC
  Administered 2020-02-22: 1000 ug via INTRAMUSCULAR

## 2020-02-22 NOTE — Progress Notes (Signed)
Patient presented for B 12 injection to left deltoid, patient voiced no concerns nor showed any signs of distress during injection. 

## 2020-02-23 ENCOUNTER — Telehealth: Payer: Self-pay

## 2020-02-23 DIAGNOSIS — E538 Deficiency of other specified B group vitamins: Secondary | ICD-10-CM

## 2020-02-23 NOTE — Telephone Encounter (Signed)
Lab ordered.

## 2020-02-28 NOTE — Progress Notes (Signed)
Yes it was routed to provider

## 2020-02-29 ENCOUNTER — Ambulatory Visit: Payer: Medicare HMO | Admitting: Endocrinology

## 2020-02-29 ENCOUNTER — Other Ambulatory Visit: Payer: Self-pay | Admitting: Internal Medicine

## 2020-02-29 ENCOUNTER — Other Ambulatory Visit: Payer: Self-pay

## 2020-02-29 ENCOUNTER — Other Ambulatory Visit (INDEPENDENT_AMBULATORY_CARE_PROVIDER_SITE_OTHER): Payer: Medicare HMO

## 2020-02-29 ENCOUNTER — Ambulatory Visit (INDEPENDENT_AMBULATORY_CARE_PROVIDER_SITE_OTHER): Payer: Medicare HMO

## 2020-02-29 DIAGNOSIS — E538 Deficiency of other specified B group vitamins: Secondary | ICD-10-CM

## 2020-02-29 DIAGNOSIS — E0590 Thyrotoxicosis, unspecified without thyrotoxic crisis or storm: Secondary | ICD-10-CM | POA: Diagnosis not present

## 2020-02-29 LAB — T4, FREE: Free T4: 1.51 ng/dL (ref 0.60–1.60)

## 2020-02-29 LAB — TSH: TSH: <0.01 u[IU]/mL — ABNORMAL LOW (ref 0.35–4.50)

## 2020-02-29 MED ORDER — CYANOCOBALAMIN 1000 MCG/ML IJ SOLN
1000.0000 ug | Freq: Once | INTRAMUSCULAR | Status: AC
  Administered 2020-02-29: 1000 ug via INTRAMUSCULAR

## 2020-02-29 NOTE — Progress Notes (Addendum)
Patient presented for B 12 injection to Left deltoid, Patient voiced no concerns nor showed any signs of distress during injection.

## 2020-03-03 LAB — INTRINSIC FACTOR ANTIBODIES: Intrinsic Factor: NEGATIVE

## 2020-03-07 ENCOUNTER — Encounter: Payer: Self-pay | Admitting: Neurology

## 2020-03-07 ENCOUNTER — Encounter: Payer: Self-pay | Admitting: Endocrinology

## 2020-03-07 ENCOUNTER — Other Ambulatory Visit: Payer: Self-pay

## 2020-03-07 ENCOUNTER — Ambulatory Visit (INDEPENDENT_AMBULATORY_CARE_PROVIDER_SITE_OTHER): Payer: Medicare HMO

## 2020-03-07 ENCOUNTER — Ambulatory Visit: Payer: Medicare HMO | Admitting: Neurology

## 2020-03-07 ENCOUNTER — Ambulatory Visit: Payer: Medicare HMO | Admitting: Endocrinology

## 2020-03-07 VITALS — BP 150/80 | HR 72 | Ht 64.0 in | Wt 157.0 lb

## 2020-03-07 VITALS — BP 130/76 | HR 64 | Ht 64.0 in | Wt 157.4 lb

## 2020-03-07 DIAGNOSIS — E0590 Thyrotoxicosis, unspecified without thyrotoxic crisis or storm: Secondary | ICD-10-CM | POA: Diagnosis not present

## 2020-03-07 DIAGNOSIS — M5417 Radiculopathy, lumbosacral region: Secondary | ICD-10-CM

## 2020-03-07 DIAGNOSIS — E538 Deficiency of other specified B group vitamins: Secondary | ICD-10-CM | POA: Diagnosis not present

## 2020-03-07 DIAGNOSIS — E1142 Type 2 diabetes mellitus with diabetic polyneuropathy: Secondary | ICD-10-CM | POA: Diagnosis not present

## 2020-03-07 DIAGNOSIS — M21371 Foot drop, right foot: Secondary | ICD-10-CM

## 2020-03-07 MED ORDER — CYANOCOBALAMIN 1000 MCG/ML IJ SOLN
1000.0000 ug | Freq: Once | INTRAMUSCULAR | Status: AC
  Administered 2020-03-07: 1000 ug via INTRAMUSCULAR

## 2020-03-07 NOTE — Progress Notes (Signed)
Patient ID: Maria Hinton, female   DOB: 1946/05/23, 74 y.o.   MRN: [id number]                                                                                                              Reason for Appointment:  Hyperthyroidism, follow-up visit   Referring physician: Derrel Nip      History of Present Illness:   Prior history:  For several months before her consultation she had symptoms of weakness, palpitations, shakiness, feeling sweaty in am, nervousness, and fatigue.  Also would feel a strong heartbeat in her ears She had been on atenolol for hypertension also She had a decreased appetite and started losing weight late last summer  The patient had lost about  26 lbs since these symptoms started   RECENT history:  She started taking methimazole in 09/2017 for her hyperthyroidism On her 10/2017 visit she was taking 10 mg twice daily because of hyperthyroidism she was given 30 mg daily In late 2019 she appeared to be needing progressively higher doses of methimazole  On her visit in 12/19 she was having fatigue and weakness; did not complain of feeling shaky or having palpitations, no weight loss However her thyroid levels were significantly high with 7.5 mg of methimazole The dose was increased  RECENT history:  Her methimazole was previously needed on and off She again had a relapse in 5/21 without methimazole  She had I-131 treatment on 01/02/2020 with 25 mCi; the dose was prescribed by the radiologist She has not taken methimazole since the treatment  Her weight may have gone up a little recently Recently does not complain of how heat or cold intolerance, feeling shaky or nervous, no weakness  Labs are showing the free T4 level to be back to normal although still higher than when she was on methimazole treatment TSH suppressed   Wt Readings from Last 3 Encounters:  03/07/20 157 lb 6.4 oz (71.4 kg)  03/07/20 157 lb (71.2 kg)  02/20/20 155 lb 9.6 oz (70.6 kg)       Thyrotropin receptor antibody slightly above normal as of 4/21   Thyroid function tests as follows:     Lab Results  Component Value Date   FREET4 1.51 02/29/2020   FREET4 1.74 (H) 01/30/2020   FREET4 1.82 (H) 11/27/2019   T3FREE 4.2 11/27/2019   T3FREE 2.8 10/11/2019   T3FREE 3.1 08/30/2019   TSH <0.01 (L) 02/29/2020   TSH <0.01 (L) 11/27/2019   TSH 1.94 10/11/2019    Lab Results  Component Value Date   THYROTRECAB 2.08 (H) 10/11/2019   THYROTRECAB 1.66 07/13/2019   THYROTRECAB 1.79 (H) 03/30/2019     Allergies as of 03/07/2020      Reactions   Vicodin [hydrocodone-acetaminophen] Other (See Comments)   Insomnia       Medication List       Accurate as of March 07, 2020 11:48 AM. If you have any questions, ask your nurse or doctor.        albuterol 108 (  90 Base) MCG/ACT inhaler Commonly known as: VENTOLIN HFA Inhale 2 puffs into the lungs every 6 (six) hours as needed for wheezing.   ALPRAZolam 0.25 MG tablet Commonly known as: XANAX TAKE 1 TABLET BY MOUTH TWICE A DAY AS NEEDED   aspirin EC 81 MG tablet Take 81 mg by mouth daily.   atenolol 50 MG tablet Commonly known as: TENORMIN TAKE 1 TABLET BY MOUTH TWICE A DAY   atorvastatin 40 MG tablet Commonly known as: LIPITOR TAKE 1 TABLET BY MOUTH DAILY AT 6 PM.   BD Pen Needle Nano U/F 32G X 4 MM Misc Generic drug: Insulin Pen Needle USE DAILY AS DIRECTED WITH LEVEMIR   gabapentin 100 MG capsule Commonly known as: NEURONTIN Take 2 capsules (200 mg total) by mouth 3 (three) times daily.   Levemir FlexTouch 100 UNIT/ML FlexPen Generic drug: insulin detemir INJECT 15 UNITS INTO THE SKIN DAILY AT 10 PM.   losartan 100 MG tablet Commonly known as: COZAAR Take 1 tablet (100 mg total) by mouth daily.   metFORMIN 1000 MG tablet Commonly known as: GLUCOPHAGE TAKE 1 TABLET BY MOUTH 2 TIMES DAILY WITH A MEAL.   methimazole 5 MG tablet Commonly known as: TAPAZOLE Take 5 mg by mouth 3 (three) times  daily. What changed: Another medication with the same name was removed. Continue taking this medication, and follow the directions you see here. Changed by: Alda Berthold, DO   omeprazole 40 MG capsule Commonly known as: PRILOSEC TAKE 1 CAPSULE BY MOUTH EVERY DAY   OneTouch Delica Lancets 67H Misc 1 application by Does not apply route 2 (two) times daily. Use to check blood sugar up to two times daily. E11.69   OneTouch Verio test strip Generic drug: glucose blood USE AS INSTRUCTED   traMADol 50 MG tablet Commonly known as: ULTRAM TAKE 1 TABLET BY MOUTH EVERY 6 HOURS AS NEEDED FOR SPINAL STENOSIS PAIN           Past Medical History:  Diagnosis Date  . Actinic keratoses    follows with dermatology   . Arthritis   . Asthma   . Chronic back pain    DDD,herniated disc/spondylosis/radiculopathy  . COPD (chronic obstructive pulmonary disease) ([place])   . Diabetes mellitus    1980s  . Early cataracts, bilateral    Dr. Ruthine Dose - [facility]  . GERD (gastroesophageal reflux disease)    takes Omeprazole daily  . Hemorrhoids   . History of colonic polyps    colonoscopy 04/2012 - Dr Candace Cruise  . Hyperlipidemia    takes Pravastatin daily  . Hypertension    takes Metoprolol/HCTZ daily  . Hyperthyroidism   . Insomnia   . Joint pain    fingers  . Joint swelling   . Pneumonia    x 2 ;in Dec 2010/2011  . PONV (postoperative nausea and vomiting)   . Right leg weakness     Past Surgical History:  Procedure Laterality Date  . ABDOMINAL HYSTERECTOMY     at age 73  . BACK SURGERY  20+yrs ago  . CATARACT EXTRACTION W/PHACO Left 01/04/2019   Procedure: CATARACT EXTRACTION PHACO AND INTRAOCULAR LENS PLACEMENT ([place])  LEFT;  Surgeon: Leandrew Koyanagi, MD;  Location: [place];  Service: Ophthalmology;  Laterality: Left;  GIVE IV ZOFRAN  . CATARACT EXTRACTION W/PHACO Right 02/01/2019   Procedure: CATARACT EXTRACTION PHACO AND INTRAOCULAR LENS PLACEMENT ([place])  RIGHT  DIABETIC;  Surgeon: Leandrew Koyanagi, MD;  Location: [place];  Service: Ophthalmology;  Laterality: Right;  DIABETIC GIVE IV ZOFRAN  . COLONOSCOPY    . COLONOSCOPY N/A 05/13/2015   Procedure: COLONOSCOPY;  Surgeon: Hulen Luster, MD;  Location: [facility] ENDOSCOPY;  Service: Gastroenterology;  Laterality: N/A;  . ESOPHAGOGASTRODUODENOSCOPY  2011  . ESOPHAGOGASTRODUODENOSCOPY (EGD) WITH PROPOFOL N/A 09/02/2017   Procedure: ESOPHAGOGASTRODUODENOSCOPY (EGD) WITH PROPOFOL;  Surgeon: Virgel Manifold, MD;  Location: [facility] ENDOSCOPY;  Service: Endoscopy;  Laterality: N/A;  . FOOT SURGERY  2011   bunionectomy and knot removed from bottom of both feet  . JOINT REPLACEMENT Left 2014   hip  Kraskinski  . LUMBAR LAMINECTOMY/DECOMPRESSION MICRODISCECTOMY  09/10/2011   Procedure: LUMBAR LAMINECTOMY/DECOMPRESSION MICRODISCECTOMY 1 LEVEL;  Surgeon: Hosie Spangle, MD;  Location: [place] NEUR[facility]S;  Service: Neurosurgery;  Laterality: Right;  RIGHT Lumbar  Laminotomy and microdiskectomy Lumbar Four-Five  . LUMBAR LAMINECTOMY/DECOMPRESSION MICRODISCECTOMY N/A 06/26/2015   Procedure: LUMBAR LAMINECTOMY/DECOMPRESSION MICRODISCECTOMY 1 LEVEL;  Surgeon: Jovita Gamma, MD;  Location: [place] NEURO ORS;  Service: Neurosurgery;  Laterality: N/A;  L3 and L4 Laminectomies  . MELANOMA EXCISION  2011   on nose/back on neck  . THORACIC DISC SURGERY  at age 29 and 43    mass of veins that had gotten into muscle and wrapped around rib;3 ribs also removed from left side  . TONSILLECTOMY  as a child   and adenoids   . TOTAL HIP ARTHROPLASTY Left 2014   Dr. Mack Guise  . TUBAL LIGATION    . VAGINA SURGERY     vaginal wall ruptured     Family History  Problem Relation Age of Onset  . Heart disease Brother        valve replacement  . COPD Brother   . COPD Mother        Emphysema  . Diabetes Mother   . Other Mother        Died in house fire at age 65.  Marland Kitchen Heart disease Father   . Seizures Father   . Cancer Father         Lung cancer with brain metastasis  . Asthma Brother   . COPD Son 3  . Cancer Other        aunt ? maternal vs paternal side lung cancer   . Diabetes Maternal Grandmother   . Anesthesia problems Neg Hx   . Hypotension Neg Hx   . Malignant hyperthermia Neg Hx   . Pseudochol deficiency Neg Hx   . Thyroid disease Neg Hx   . Breast cancer Neg Hx     Social History:  reports that she quit smoking about 3 years ago. Her smoking use included cigarettes. She has a 5.00 pack-year smoking history. She has never used smokeless tobacco. She reports that she does not drink alcohol and does not use drugs.  Allergies:  Allergies  Allergen Reactions  . Vicodin [Hydrocodone-Acetaminophen] Other (See Comments)    Insomnia      Review of Systems  She is on basal insulin with Levemir and Metformin for diabetes and followed by PCP A1c as follows   Lab Results  Component Value Date   HGBA1C 7.8 (H) 02/20/2020   HGBA1C 7.8 (H) 08/30/2019   HGBA1C 7.4 (H) 04/14/2019   Lab Results  Component Value Date   MICROALBUR 1.8 02/20/2020   LDLCALC 45 04/14/2019   CREATININE 0.83 02/20/2020      Examination:   BP 130/76 (BP Location: Left Arm, Patient Position: Sitting, Cuff Size: Normal)   Pulse 64  Ht 5\' 4"  (1.626 m)   Wt 157 lb 6.4 oz (71.4 kg)   SpO2 96%   BMI 27.02 kg/m   Thyroid not palpable  Biceps reflexes appear normal No tremor   Assessment/Plan:   Hyperthyroidism secondary to Graves' disease, on antithyroid drug treatment since 09/2017 Has not had a goiter at baseline  She has had I-131 treatment On 01/02/2020  Subjectively has done well Even though she is 2 months post I-131 treatment thyroid levels and not completely back to normal However she is showing improvement in her levels of free T4, previously still high last month  Exam is unremarkable As before no thyroid enlargement present  Discussed that it is unclear whether she may potentially relapse after  having the I-131 treatment stunning the thyroid or she may just take a little longer to achieve more complete thyroid destruction  She will come back in 6 weeks again unless she has symptoms before that  Elayne Snare 03/07/2020, 11:48 AM    Note: This office note was prepared with Dragon voice recognition system technology. Any transcriptional errors that result from this process are unintentional.

## 2020-03-07 NOTE — Progress Notes (Signed)
Patient presented for B 12 injection to left deltoid, patient voiced no concerns nor showed any signs of distress during injection. 

## 2020-03-07 NOTE — Patient Instructions (Signed)
Refer to [facility] for right ankle-foot orthotic  If symptoms get worse, please come back and see me

## 2020-03-07 NOTE — Progress Notes (Signed)
[facility] Clinic Note - Initial Visit   Date: 03/07/20  [name] MRN: [id number] DOB: October 02, 1945   Dear Dr. Derrel Nip:  Thank you for your kind referral of Maria Hinton for consultation of right foot drop. Although her history is well known to you, please allow [place] to reiterate it for the purpose of our medical record. The patient was accompanied to the clinic by self.    History of Present Illness: Maria Hinton is a 74 y.o. right-handed female with diabetes mellitus, hyperlipidemia, GERD, hyperthyroidism, and COPD presenting for evaluation of right foot drop.   She has long history of chronic low back pain with lumbar decompression x 3 at L4-5 and L3-4.  Prior to her last surgery, she recalls having right foot weakness and radicular leg pain.  Since this time, she has noticed progressive right foot weakness such that she drags her foot when walking.  She is unable to extend the foot and toes on the right, but is able to do this on the left.  She has fallen 3 times this year because of tripping over her toes.  She also reports having numbness in the feet for several years, worse on the right.  She had electrodiagnostic testing at [facility] in 2019 which showed chronic lumbosacral radiculopathy affecting the right L4-5, and S1 myotomes in addition to sensorimotor polyneuropathy.  MRI lumbar spine from 2016 shows right subarticular recess and foraminal stenosis at L4-5 as well as spinal canal stenosis at L3-4.  She has known diabetes mellitus and neuropathy related to this.  She does not drink alcohol.  No family history of neuropathy.  Out-side paper records, electronic medical record, and images have been reviewed where available and summarized as: Lab Results  Component Value Date   HGBA1C 7.8 (H) 02/20/2020   Lab Results  Component Value Date   VITAMINB12 147 (L) 02/20/2020   Lab Results  Component Value Date   TSH <0.01  (L) 02/29/2020   Lab Results  Component Value Date   ESRSEDRATE 7 10/11/2019   MRI lumbar spine 10/31/2014: 1. L4-5 disc extrusion is resolved but residual disc bulge and facet overgrowth still narrows the right subarticular recess. 2. L4-5 moderate right foraminal stenosis which is progressed from 2013. 3. L3-4 borderline advanced spinal canal stenosis, stable from 2013. 4. Noncompressive degenerative changes are noted above.  NCS/EMG of the legs 09/22/2017: This is an abnormal study.  There is electrodiagnostic evidence of mild chronic right lumbosacral radiculopathy mainly involving right L4-5 lesser degree of S1 myotomes.  In addition, there is evidence of length dependent axonal sensorimotor polyneuropathy.    Past Medical History:  Diagnosis Date  . Actinic keratoses    follows with dermatology   . Arthritis   . Asthma   . Chronic back pain    DDD,herniated disc/spondylosis/radiculopathy  . COPD (chronic obstructive pulmonary disease) ([place])   . Diabetes mellitus    1980s  . Early cataracts, bilateral    Dr. Ruthine Dose - [facility]  . GERD (gastroesophageal reflux disease)    takes Omeprazole daily  . Hemorrhoids   . History of colonic polyps    colonoscopy 04/2012 - Dr Candace Cruise  . Hyperlipidemia    takes Pravastatin daily  . Hypertension    takes Metoprolol/HCTZ daily  . Hyperthyroidism   . Insomnia   . Joint pain    fingers  . Joint swelling   . Pneumonia    x 2 ;in Dec  2010/2011  . PONV (postoperative nausea and vomiting)   . Right leg weakness     Past Surgical History:  Procedure Laterality Date  . ABDOMINAL HYSTERECTOMY     at age 9  . BACK SURGERY  20+yrs ago  . CATARACT EXTRACTION W/PHACO Left 01/04/2019   Procedure: CATARACT EXTRACTION PHACO AND INTRAOCULAR LENS PLACEMENT ([place])  LEFT;  Surgeon: Leandrew Koyanagi, MD;  Location: [place];  Service: Ophthalmology;  Laterality: Left;  GIVE IV ZOFRAN  . CATARACT EXTRACTION W/PHACO  Right 02/01/2019   Procedure: CATARACT EXTRACTION PHACO AND INTRAOCULAR LENS PLACEMENT ([place])  RIGHT DIABETIC;  Surgeon: Leandrew Koyanagi, MD;  Location: [place];  Service: Ophthalmology;  Laterality: Right;  DIABETIC GIVE IV ZOFRAN  . COLONOSCOPY    . COLONOSCOPY N/A 05/13/2015   Procedure: COLONOSCOPY;  Surgeon: Hulen Luster, MD;  Location: [facility] ENDOSCOPY;  Service: Gastroenterology;  Laterality: N/A;  . ESOPHAGOGASTRODUODENOSCOPY  2011  . ESOPHAGOGASTRODUODENOSCOPY (EGD) WITH PROPOFOL N/A 09/02/2017   Procedure: ESOPHAGOGASTRODUODENOSCOPY (EGD) WITH PROPOFOL;  Surgeon: Virgel Manifold, MD;  Location: [facility] ENDOSCOPY;  Service: Endoscopy;  Laterality: N/A;  . FOOT SURGERY  2011   bunionectomy and knot removed from bottom of both feet  . JOINT REPLACEMENT Left 2014   hip  Kraskinski  . LUMBAR LAMINECTOMY/DECOMPRESSION MICRODISCECTOMY  09/10/2011   Procedure: LUMBAR LAMINECTOMY/DECOMPRESSION MICRODISCECTOMY 1 LEVEL;  Surgeon: Hosie Spangle, MD;  Location: [place] NEUR[facility]S;  Service: Neurosurgery;  Laterality: Right;  RIGHT Lumbar  Laminotomy and microdiskectomy Lumbar Four-Five  . LUMBAR LAMINECTOMY/DECOMPRESSION MICRODISCECTOMY N/A 06/26/2015   Procedure: LUMBAR LAMINECTOMY/DECOMPRESSION MICRODISCECTOMY 1 LEVEL;  Surgeon: Jovita Gamma, MD;  Location: [place] NEURO ORS;  Service: Neurosurgery;  Laterality: N/A;  L3 and L4 Laminectomies  . MELANOMA EXCISION  2011   on nose/back on neck  . THORACIC DISC SURGERY  at age 76 and 26    mass of veins that had gotten into muscle and wrapped around rib;3 ribs also removed from left side  . TONSILLECTOMY  as a child   and adenoids   . TOTAL HIP ARTHROPLASTY Left 2014   Dr. Mack Guise  . TUBAL LIGATION    . VAGINA SURGERY     vaginal wall ruptured      Medications:  Outpatient Encounter Medications as of 03/07/2020  Medication Sig  . albuterol (PROVENTIL HFA;VENTOLIN HFA) 108 (90 Base) MCG/ACT inhaler Inhale 2 puffs into the lungs every  6 (six) hours as needed for wheezing.  Marland Kitchen ALPRAZolam (XANAX) 0.25 MG tablet TAKE 1 TABLET BY MOUTH TWICE A DAY AS NEEDED  . aspirin EC 81 MG tablet Take 81 mg by mouth daily.  Marland Kitchen atenolol (TENORMIN) 50 MG tablet TAKE 1 TABLET BY MOUTH TWICE A DAY  . atorvastatin (LIPITOR) 40 MG tablet TAKE 1 TABLET BY MOUTH DAILY AT 6 PM.  . BD PEN NEEDLE NANO U/F 32G X 4 MM MISC USE DAILY AS DIRECTED WITH LEVEMIR  . gabapentin (NEURONTIN) 100 MG capsule Take 2 capsules (200 mg total) by mouth 3 (three) times daily.  Marland Kitchen LEVEMIR FLEXTOUCH 100 UNIT/ML FlexPen INJECT 15 UNITS INTO THE SKIN DAILY AT 10 PM.  . losartan (COZAAR) 100 MG tablet Take 1 tablet (100 mg total) by mouth daily.  . metFORMIN (GLUCOPHAGE) 1000 MG tablet TAKE 1 TABLET BY MOUTH 2 TIMES DAILY WITH A MEAL.  . methimazole (TAPAZOLE) 5 MG tablet Take 5 mg by mouth 3 (three) times daily.  Marland Kitchen omeprazole (PRILOSEC) 40 MG capsule TAKE 1 CAPSULE BY MOUTH  EVERY DAY  . OneTouch Delica Lancets 39J MISC 1 application by Does not apply route 2 (two) times daily. Use to check blood sugar up to two times daily. E11.69  . ONETOUCH VERIO test strip USE AS INSTRUCTED  . traMADol (ULTRAM) 50 MG tablet TAKE 1 TABLET BY MOUTH EVERY 6 HOURS AS NEEDED FOR SPINAL STENOSIS PAIN  . [DISCONTINUED] methimazole (TAPAZOLE) 5 MG tablet TALE 1/2 TABLET BY MOUTH ONCE DAILY.   No facility-administered encounter medications on file as of 03/07/2020.    Allergies:  Allergies  Allergen Reactions  . Vicodin [Hydrocodone-Acetaminophen] Other (See Comments)    Insomnia     Family History: Family History  Problem Relation Age of Onset  . Heart disease Brother        valve replacement  . COPD Brother   . COPD Mother        Emphysema  . Diabetes Mother   . Other Mother        Died in house fire at age 24.  Marland Kitchen Heart disease Father   . Seizures Father   . Cancer Father        Lung cancer with brain metastasis  . Asthma Brother   . COPD Son 47  . Cancer Other        aunt ?  maternal vs paternal side lung cancer   . Diabetes Maternal Grandmother   . Anesthesia problems Neg Hx   . Hypotension Neg Hx   . Malignant hyperthermia Neg Hx   . Pseudochol deficiency Neg Hx   . Thyroid disease Neg Hx   . Breast cancer Neg Hx     Social History: Social History   Tobacco Use  . Smoking status: Former Smoker    Packs/day: 0.25    Years: 20.00    Pack years: 5.00    Types: Cigarettes    Quit date: 2018    Years since quitting: 3.6  . Smokeless tobacco: Never Used  . Tobacco comment: Pt quit 11/08/2014.Pt had quit smoking for 13 years before starting back in 2010; per 05/2017 visit quit smoking 2017 smoked total 25 years 1 ppd   Vaping Use  . Vaping Use: Never used  Substance Use Topics  . Alcohol use: No    Alcohol/week: 0.0 standard drinks  . Drug use: No   Social History   Social History Narrative   Widowed.    Lives alone.   Right-handed.    2 cups caffeine per day.    Vital Signs:  BP (!) 150/80   Pulse 72   Ht 5\' 4"  (1.626 m)   Wt 157 lb (71.2 kg)   SpO2 98%   BMI 26.95 kg/m    Neurological Exam: MENTAL STATUS including orientation to time, place, person, recent and remote memory, attention span and concentration, language, and fund of knowledge is normal.  Speech is not dysarthric.  CRANIAL NERVES: II:  No visual field defects.   III-IV-VI: Pupils equal round and reactive to light.  Normal conjugate, extra-ocular eye movements in all directions of gaze.  No nystagmus.  No ptosis.   XI:  Normal shoulder shrug and head rotation.    MOTOR:  No atrophy, fasciculations or abnormal movements.  No pronator drift.   Upper Extremity:  Right  Left  Deltoid  5/5   5/5   Biceps  5/5   5/5   Triceps  5/5   5/5   Infraspinatus 5/5  5/5  Medial pectoralis 5/5  5/5  Wrist extensors  5/5   5/5   Wrist flexors  5/5   5/5   Finger extensors  5/5   5/5   Finger flexors  5/5   5/5   Dorsal interossei  5/5   5/5   Abductor pollicis  5/5   5/5     Tone (Ashworth scale)  0  0   Lower Extremity:  Right  Left  Hip flexors  5/5   5/5   Hip extensors  5/5   5/5   Adductor 5/5  5/5  Abductor 5/5  5/5  Knee flexors  5/5   5/5   Knee extensors  5/5   5/5   Inversion 3/5  5/5  Eversion 3/5  5/5  Dorsiflexors  3/5   5/5   Plantarflexors  4/5   5/5   Toe extensors  2/5   5/5   Toe flexors  2/5   5/5   Tone (Ashworth scale)  0  0   MSRs:  Right        Left                  brachioradialis 2+  2+  biceps 2+  2+  triceps 2+  2+  patellar 2+  2+  ankle jerk 0  0  Hoffman no  no  plantar response down  down   SENSORY: Vibration is intact at the knees bilaterally reduced to 40% at the left ankle and absent at the left great toe, vibration is reduced to 10% at the right ankle and absent at the right great toe.  Temperature and pinprick is reduced over the lower right leg.  Romberg sign is present.    COORDINATION/GAIT: Normal finger-to- nose-finger..  Intact rapid alternating movements bilaterally.  Mild steppage gait on the right, stable.  She is unable to stand on her heels on the right.  She is able to stand on her toes with some difficulty.  IMPRESSION: Right foot drop.  Symptoms are most likely chronic findings from prior lumbosacral radiculopathy at L5 5-S1 with overlapping diabetic polyneuropathy.  Prior electrodiagnostic has also suggested same.  I offered to repeat electrodiagnostic testing and/or MRI lumbar spine, however given the chronicity of her symptoms.  These are most likely permanent neurological deficits and management is supportive.  After discussing her options she decided to not pursue testing and physical therapy is cost prohibitive.  I will refer her to [facility] for custom AFO. Patient educated on daily foot inspection, fall prevention, and safety precautions around the home.  If any of her symptoms get worse, she is welcome to return for further evaluation.   Thank you for allowing me to participate in patient's  care.  If I can answer any additional questions, I would be pleased to do so.    Sincerely,     K. Posey Pronto, DO

## 2020-03-13 ENCOUNTER — Other Ambulatory Visit: Payer: Self-pay | Admitting: Internal Medicine

## 2020-03-19 ENCOUNTER — Ambulatory Visit (INDEPENDENT_AMBULATORY_CARE_PROVIDER_SITE_OTHER): Payer: Medicare HMO

## 2020-03-19 ENCOUNTER — Other Ambulatory Visit: Payer: Self-pay

## 2020-03-19 DIAGNOSIS — E538 Deficiency of other specified B group vitamins: Secondary | ICD-10-CM | POA: Diagnosis not present

## 2020-03-19 MED ORDER — CYANOCOBALAMIN 1000 MCG/ML IJ SOLN
1000.0000 ug | Freq: Once | INTRAMUSCULAR | Status: AC
  Administered 2020-03-19: 1000 ug via INTRAMUSCULAR

## 2020-03-19 NOTE — Progress Notes (Signed)
Patient presented for B 12 injection to left deltoid, patient voiced no concerns nor showed any signs of distress during injection. 

## 2020-03-23 ENCOUNTER — Other Ambulatory Visit: Payer: Self-pay | Admitting: Internal Medicine

## 2020-04-18 ENCOUNTER — Other Ambulatory Visit (INDEPENDENT_AMBULATORY_CARE_PROVIDER_SITE_OTHER): Payer: Medicare HMO

## 2020-04-18 ENCOUNTER — Other Ambulatory Visit: Payer: Self-pay

## 2020-04-18 DIAGNOSIS — E0590 Thyrotoxicosis, unspecified without thyrotoxic crisis or storm: Secondary | ICD-10-CM

## 2020-04-18 LAB — TSH: TSH: 33.14 u[IU]/mL — ABNORMAL HIGH (ref 0.35–4.50)

## 2020-04-18 LAB — T4, FREE: Free T4: 0.42 ng/dL — ABNORMAL LOW (ref 0.60–1.60)

## 2020-04-18 LAB — T3, FREE: T3, Free: 2.5 pg/mL (ref 2.3–4.2)

## 2020-04-22 ENCOUNTER — Ambulatory Visit: Payer: Medicare HMO | Admitting: Endocrinology

## 2020-04-22 ENCOUNTER — Other Ambulatory Visit: Payer: Self-pay

## 2020-04-22 ENCOUNTER — Encounter: Payer: Self-pay | Admitting: Endocrinology

## 2020-04-22 ENCOUNTER — Telehealth: Payer: Self-pay

## 2020-04-22 VITALS — BP 136/96 | HR 65 | Wt 162.8 lb

## 2020-04-22 DIAGNOSIS — E1110 Type 2 diabetes mellitus with ketoacidosis without coma: Secondary | ICD-10-CM | POA: Diagnosis not present

## 2020-04-22 DIAGNOSIS — Z23 Encounter for immunization: Secondary | ICD-10-CM

## 2020-04-22 LAB — POCT GLYCOSYLATED HEMOGLOBIN (HGB A1C): Hemoglobin A1C: 7.3 % — AB (ref 4.0–5.6)

## 2020-04-22 MED ORDER — LEVOTHYROXINE SODIUM 112 MCG PO TABS: 112.0000 ug | ORAL_TABLET | Freq: Every day | ORAL | 3 refills | Status: DC

## 2020-04-22 NOTE — Progress Notes (Signed)
Patient ID: Maria Hinton, female   DOB: [birth year], 74 y.o.   MRN: [id number]                                                                                                              Reason for Appointment:  follow-up visit     History of Present Illness:   Prior history of hyperthyroidism:  For several months before her consultation she had symptoms of weakness, palpitations, shakiness, feeling sweaty in am, nervousness, and fatigue.  Also would feel a strong heartbeat in her ears She had been on atenolol for hypertension also She had a decreased appetite and started losing weight late last summer  The patient had lost about  26 lbs since these symptoms started   RECENT history:  She started taking methimazole in 09/2017 for her hyperthyroidism On her 10/2017 visit she was taking 10 mg twice daily because of hyperthyroidism she was given 30 mg daily In late 2019 she appeared to be needing progressively higher doses of methimazole  On her visit in 12/19 she was having fatigue and weakness; did not complain of feeling shaky or having palpitations, no weight loss However her thyroid levels were significantly high with 7.5 mg of methimazole The dose was increased  Her methimazole was previously needed on and off She again had a relapse in 5/21 without methimazole  RECENT history:  She had I-131 treatment on 01/02/2020 with 25 mCi; the dose was prescribed by the radiologist She has not taken methimazole since the treatment  Her thyroid levels had been normal when last seen in 9/21 She does feel fairly good without any unusual fatigue except for stress related to family issues She has a little more sweating at night which is new Also during the day may feel colder than usual No lethargy or sleepiness Has gained 5 pounds  Labs are showing the free T4 level to be significantly lower at only 0.4 with higher TSH at 33   Wt Readings from Last 3 Encounters:  04/22/20 162 lb 12.8  oz (73.8 kg)  03/07/20 157 lb 6.4 oz (71.4 kg)  03/07/20 157 lb (71.2 kg)     Thyrotropin receptor antibody slightly above normal as of 4/21   Thyroid function tests as follows:     Lab Results  Component Value Date   FREET4 0.42 (L) 04/18/2020   FREET4 1.51 02/29/2020   FREET4 1.74 (H) 01/30/2020   T3FREE 2.5 04/18/2020   T3FREE 4.2 11/27/2019   T3FREE 2.8 10/11/2019   TSH 33.14 (H) 04/18/2020   TSH <0.01 (L) 02/29/2020   TSH <0.01 (L) 11/27/2019    Lab Results  Component Value Date   THYROTRECAB 2.08 (H) 10/11/2019   THYROTRECAB 1.66 07/13/2019   THYROTRECAB 1.79 (H) 03/30/2019     Allergies as of 04/22/2020      Reactions   Vicodin [hydrocodone-acetaminophen] Other (See Comments)   Insomnia       Medication List       Accurate as of April 22, 2020  3:06 PM. If you have any questions, ask your nurse or doctor.        albuterol 108 (90 Base) MCG/ACT inhaler Commonly known as: VENTOLIN HFA Inhale 2 puffs into the lungs every 6 (six) hours as needed for wheezing.   ALPRAZolam 0.25 MG tablet Commonly known as: XANAX TAKE 1 TABLET BY MOUTH TWICE A DAY AS NEEDED   aspirin EC 81 MG tablet Take 81 mg by mouth daily.   atenolol 50 MG tablet Commonly known as: TENORMIN TAKE 1 TABLET BY MOUTH TWICE A DAY   atorvastatin 40 MG tablet Commonly known as: LIPITOR TAKE 1 TABLET BY MOUTH DAILY AT 6 PM.   BD Pen Needle Nano U/F 32G X 4 MM Misc Generic drug: Insulin Pen Needle USE DAILY AS DIRECTED WITH LEVEMIR   gabapentin 100 MG capsule Commonly known as: NEURONTIN Take 2 capsules (200 mg total) by mouth 3 (three) times daily.   Levemir FlexTouch 100 UNIT/ML FlexPen Generic drug: insulin detemir INJECT 15 UNITS INTO THE SKIN DAILY AT 10 PM.   levothyroxine 112 MCG tablet Commonly known as: SYNTHROID Take 1 tablet (112 mcg total) by mouth daily. Started by: Elayne Snare, MD   losartan 100 MG tablet Commonly known as: COZAAR Take 1 tablet (100 mg total)  by mouth daily.   metFORMIN 1000 MG tablet Commonly known as: GLUCOPHAGE TAKE 1 TABLET BY MOUTH 2 TIMES DAILY WITH A MEAL.   omeprazole 40 MG capsule Commonly known as: PRILOSEC TAKE 1 CAPSULE BY MOUTH EVERY DAY   OneTouch Delica Lancets 91Y Misc 1 application by Does not apply route 2 (two) times daily. Use to check blood sugar up to two times daily. E11.69   OneTouch Verio test strip Generic drug: glucose blood USE AS INSTRUCTED   traMADol 50 MG tablet Commonly known as: ULTRAM TAKE 1 TABLET BY MOUTH EVERY 6 HOURS AS NEEDED FOR SPINAL STENOSIS PAIN           Past Medical History:  Diagnosis Date  . Actinic keratoses    follows with dermatology   . Arthritis   . Asthma   . Chronic back pain    DDD,herniated disc/spondylosis/radiculopathy  . COPD (chronic obstructive pulmonary disease) ([place])   . Diabetes mellitus    1980s  . Early cataracts, bilateral    Dr. Ruthine Dose - [facility]  . GERD (gastroesophageal reflux disease)    takes Omeprazole daily  . Hemorrhoids   . History of colonic polyps    colonoscopy 04/2012 - Dr Candace Cruise  . Hyperlipidemia    takes Pravastatin daily  . Hypertension    takes Metoprolol/HCTZ daily  . Hyperthyroidism   . Insomnia   . Joint pain    fingers  . Joint swelling   . Pneumonia    x 2 ;in Dec 2010/2011  . PONV (postoperative nausea and vomiting)   . Right leg weakness     Past Surgical History:  Procedure Laterality Date  . ABDOMINAL HYSTERECTOMY     at age 24  . BACK SURGERY  20+yrs ago  . CATARACT EXTRACTION W/PHACO Left 01/04/2019   Procedure: CATARACT EXTRACTION PHACO AND INTRAOCULAR LENS PLACEMENT ([place])  LEFT;  Surgeon: Leandrew Koyanagi, MD;  Location: [facility];  Service: Ophthalmology;  Laterality: Left;  GIVE IV ZOFRAN  . CATARACT EXTRACTION W/PHACO Right 02/01/2019   Procedure: CATARACT EXTRACTION PHACO AND INTRAOCULAR LENS PLACEMENT ([place])  RIGHT DIABETIC;  Surgeon: Leandrew Koyanagi, MD;   Location: [place] [facility];  Service:  Ophthalmology;  Laterality: Right;  DIABETIC GIVE IV ZOFRAN  . COLONOSCOPY    . COLONOSCOPY N/A 05/13/2015   Procedure: COLONOSCOPY;  Surgeon: Hulen Luster, MD;  Location: [facility] ENDOSCOPY;  Service: Gastroenterology;  Laterality: N/A;  . ESOPHAGOGASTRODUODENOSCOPY  2011  . ESOPHAGOGASTRODUODENOSCOPY (EGD) WITH PROPOFOL N/A 09/02/2017   Procedure: ESOPHAGOGASTRODUODENOSCOPY (EGD) WITH PROPOFOL;  Surgeon: Virgel Manifold, MD;  Location: [facility] ENDOSCOPY;  Service: Endoscopy;  Laterality: N/A;  . FOOT SURGERY  2011   bunionectomy and knot removed from bottom of both feet  . JOINT REPLACEMENT Left 2014   hip  Kraskinski  . LUMBAR LAMINECTOMY/DECOMPRESSION MICRODISCECTOMY  09/10/2011   Procedure: LUMBAR LAMINECTOMY/DECOMPRESSION MICRODISCECTOMY 1 LEVEL;  Surgeon: Hosie Spangle, MD;  Location: [place] NEURO ORS;  Service: Neurosurgery;  Laterality: Right;  RIGHT Lumbar  Laminotomy and microdiskectomy Lumbar Four-Five  . LUMBAR LAMINECTOMY/DECOMPRESSION MICRODISCECTOMY N/A 06/26/2015   Procedure: LUMBAR LAMINECTOMY/DECOMPRESSION MICRODISCECTOMY 1 LEVEL;  Surgeon: Jovita Gamma, MD;  Location: [place] NEURO ORS;  Service: Neurosurgery;  Laterality: N/A;  L3 and L4 Laminectomies  . MELANOMA EXCISION  2011   on nose/back on neck  . THORACIC DISC SURGERY  at age 34 and 82    mass of veins that had gotten into muscle and wrapped around rib;3 ribs also removed from left side  . TONSILLECTOMY  as a child   and adenoids   . TOTAL HIP ARTHROPLASTY Left 2014   Dr. Mack Guise  . TUBAL LIGATION    . VAGINA SURGERY     vaginal wall ruptured     Family History  Problem Relation Age of Onset  . Heart disease Brother        valve replacement  . COPD Brother   . COPD Mother        Emphysema  . Diabetes Mother   . Other Mother        Died in house fire at age 9.  Marland Kitchen Heart disease Father   . Seizures Father   . Cancer Father        Lung cancer with brain metastasis  .  Asthma Brother   . COPD Son 55  . Cancer Other        aunt ? maternal vs paternal side lung cancer   . Diabetes Maternal Grandmother   . Anesthesia problems Neg Hx   . Hypotension Neg Hx   . Malignant hyperthermia Neg Hx   . Pseudochol deficiency Neg Hx   . Thyroid disease Neg Hx   . Breast cancer Neg Hx     Social History:  reports that she quit smoking about 3 years ago. Her smoking use included cigarettes. She has a 5.00 pack-year smoking history. She has never used smokeless tobacco. She reports that she does not drink alcohol and does not use drugs.  Allergies:  Allergies  Allergen Reactions  . Vicodin [Hydrocodone-Acetaminophen] Other (See Comments)    Insomnia      Review of Systems  She is on basal insulin with Levemir and Metformin for diabetes and followed by PCP She says that recently she is cutting back on carbohydrates like 70s A1c as follows   Lab Results  Component Value Date   HGBA1C 7.8 (H) 02/20/2020   HGBA1C 7.8 (H) 08/30/2019   HGBA1C 7.4 (H) 04/14/2019   Lab Results  Component Value Date   MICROALBUR 1.8 02/20/2020   LDLCALC 45 04/14/2019   CREATININE 0.83 02/20/2020      Examination:   BP (!) 136/96  Pulse 65   Wt 162 lb 12.8 oz (73.8 kg)   SpO2 96%   BMI 27.94 kg/m   Skin appears normal Biceps reflexes appear normal No puffiness of the face   Assessment/Plan:   Hyperthyroidism secondary to Graves' disease, on  treatment since 09/2017 Has not had a goiter at baseline  She has had I-131 treatment On 01/02/2020  She has finally become HYPOTHYROID more than 3 months after treatment  Even though her TSH is significantly higher she appears to be asymptomatic except for some weight gain and cold intolerance  Explained to her that she has had ablation of her thyroid tissues and will need supplementation Plan She will take the levothyroxine 112 mcg starting now and discuss taking it before eating in the morning without any calcium or  iron supplements at the same time  Follow-up in 2 months Flu vaccine given  Elayne Snare 04/22/2020, 3:06 PM    Note: This office note was prepared with Dragon voice recognition system technology. Any transcriptional errors that result from this process are unintentional.

## 2020-04-22 NOTE — Telephone Encounter (Signed)
Office Notes have been faxed as requested.

## 2020-05-06 ENCOUNTER — Other Ambulatory Visit: Payer: Self-pay | Admitting: Endocrinology

## 2020-05-06 ENCOUNTER — Telehealth: Payer: Self-pay | Admitting: Endocrinology

## 2020-05-06 DIAGNOSIS — E890 Postprocedural hypothyroidism: Secondary | ICD-10-CM | POA: Insufficient documentation

## 2020-05-06 NOTE — Telephone Encounter (Signed)
Labs ordered.

## 2020-05-06 NOTE — Telephone Encounter (Signed)
Please see below.

## 2020-05-06 NOTE — Telephone Encounter (Signed)
Patient does not have any orders put in for labs from her last visit with Dr Dwyane Dee

## 2020-05-08 ENCOUNTER — Other Ambulatory Visit: Payer: Self-pay

## 2020-05-08 ENCOUNTER — Other Ambulatory Visit
Admission: RE | Admit: 2020-05-08 | Discharge: 2020-05-08 | Disposition: A | Discharge status: Home / Self Care | Payer: Medicare HMO | Source: Ambulatory Visit | Attending: Gastroenterology | Admitting: Gastroenterology

## 2020-05-08 DIAGNOSIS — Z20822 Contact with and (suspected) exposure to covid-19: Secondary | ICD-10-CM | POA: Insufficient documentation

## 2020-05-08 DIAGNOSIS — Z01812 Encounter for preprocedural laboratory examination: Secondary | ICD-10-CM | POA: Diagnosis present

## 2020-05-08 LAB — SARS CORONAVIRUS 2 (TAT 6-24 HRS): SARS Coronavirus 2: NEGATIVE

## 2020-05-10 ENCOUNTER — Ambulatory Visit: Payer: Medicare HMO | Admitting: Anesthesiology

## 2020-05-10 ENCOUNTER — Ambulatory Visit
Admission: RE | Admit: 2020-05-10 | Discharge: 2020-05-10 | Disposition: A | Discharge status: Home / Self Care | Payer: Medicare HMO | Attending: Gastroenterology | Admitting: Gastroenterology

## 2020-05-10 ENCOUNTER — Encounter
Admission: RE | Disposition: A | Discharge status: Home / Self Care | Payer: Self-pay | Source: Home / Self Care | Attending: Gastroenterology

## 2020-05-10 DIAGNOSIS — Z87891 Personal history of nicotine dependence: Secondary | ICD-10-CM | POA: Insufficient documentation

## 2020-05-10 DIAGNOSIS — Z885 Allergy status to narcotic agent status: Secondary | ICD-10-CM | POA: Insufficient documentation

## 2020-05-10 DIAGNOSIS — Z794 Long term (current) use of insulin: Secondary | ICD-10-CM | POA: Insufficient documentation

## 2020-05-10 DIAGNOSIS — K635 Polyp of colon: Secondary | ICD-10-CM

## 2020-05-10 DIAGNOSIS — Z86010 Personal history of colonic polyps: Secondary | ICD-10-CM | POA: Diagnosis not present

## 2020-05-10 DIAGNOSIS — R197 Diarrhea, unspecified: Secondary | ICD-10-CM | POA: Insufficient documentation

## 2020-05-10 DIAGNOSIS — Z79899 Other long term (current) drug therapy: Secondary | ICD-10-CM | POA: Diagnosis not present

## 2020-05-10 DIAGNOSIS — K648 Other hemorrhoids: Secondary | ICD-10-CM | POA: Diagnosis not present

## 2020-05-10 DIAGNOSIS — Z7982 Long term (current) use of aspirin: Secondary | ICD-10-CM | POA: Diagnosis not present

## 2020-05-10 DIAGNOSIS — K644 Residual hemorrhoidal skin tags: Secondary | ICD-10-CM | POA: Insufficient documentation

## 2020-05-10 DIAGNOSIS — Z79890 Hormone replacement therapy (postmenopausal): Secondary | ICD-10-CM | POA: Diagnosis not present

## 2020-05-10 HISTORY — PX: COLONOSCOPY WITH PROPOFOL: SHX5780

## 2020-05-10 LAB — GLUCOSE, CAPILLARY: Glucose-Capillary: 176 mg/dL — ABNORMAL HIGH (ref 70–99)

## 2020-05-10 SURGERY — COLONOSCOPY WITH PROPOFOL
Anesthesia: General

## 2020-05-10 MED ORDER — FENTANYL CITRATE (PF) 100 MCG/2ML IJ SOLN
INTRAMUSCULAR | Status: DC | PRN
  Administered 2020-05-10: 50 ug via INTRAVENOUS
  Administered 2020-05-10 (×2): 25 ug via INTRAVENOUS

## 2020-05-10 MED ORDER — LIDOCAINE HCL (PF) 2 % IJ SOLN
INTRAMUSCULAR | Status: DC | PRN
  Administered 2020-05-10: 60 mg

## 2020-05-10 MED ORDER — SODIUM CHLORIDE 0.9 % IV SOLN
INTRAVENOUS | Status: DC
  Administered 2020-05-10: 20 mL/h via INTRAVENOUS

## 2020-05-10 MED ORDER — MIDAZOLAM HCL 2 MG/2ML IJ SOLN
INTRAMUSCULAR | Status: AC
  Filled 2020-05-10: qty 2

## 2020-05-10 MED ORDER — MIDAZOLAM HCL 5 MG/5ML IJ SOLN
INTRAMUSCULAR | Status: DC | PRN
  Administered 2020-05-10: 2 mg via INTRAVENOUS

## 2020-05-10 MED ORDER — FENTANYL CITRATE (PF) 100 MCG/2ML IJ SOLN
INTRAMUSCULAR | Status: AC
  Filled 2020-05-10: qty 2

## 2020-05-10 MED ORDER — LIDOCAINE HCL (PF) 2 % IJ SOLN
INTRAMUSCULAR | Status: AC
  Filled 2020-05-10: qty 5

## 2020-05-10 MED ORDER — PROPOFOL 500 MG/50ML IV EMUL
INTRAVENOUS | Status: AC
  Filled 2020-05-10: qty 50

## 2020-05-10 MED ORDER — PROPOFOL 10 MG/ML IV BOLUS
INTRAVENOUS | Status: DC | PRN
  Administered 2020-05-10 (×2): 20 mg via INTRAVENOUS
  Administered 2020-05-10: 10 mg via INTRAVENOUS

## 2020-05-10 MED ORDER — PROPOFOL 500 MG/50ML IV EMUL
INTRAVENOUS | Status: DC | PRN
  Administered 2020-05-10: 50 ug/kg/min via INTRAVENOUS

## 2020-05-10 NOTE — Transfer of Care (Signed)
Immediate Anesthesia Transfer of Care Note  Patient: Maria Hinton  Procedure(s) Performed: COLONOSCOPY WITH PROPOFOL (N/A )  Patient Location: PACU  Anesthesia Type:General  Level of Consciousness: sedated  Airway & Oxygen Therapy: Patient Spontanous Breathing and Patient connected to nasal cannula oxygen  Post-op Assessment: Report given to RN and Post -op Vital signs reviewed and stable  Post vital signs: Reviewed and stable  Last Vitals:  Vitals Value Taken Time  BP    Temp    Pulse 60 05/10/20 0931  Resp 20 05/10/20 0931  SpO2 100 % 05/10/20 0931  Vitals shown include unvalidated device data.  Last Pain:  Vitals:   05/10/20 0730  TempSrc: Temporal  PainSc: 0-No pain         Complications: No complications documented.

## 2020-05-10 NOTE — Progress Notes (Signed)
   05/10/20 0750  Clinical Encounter Type  Visited With Family  Visit Type Initial  Referral From Chaplain  Consult/Referral To Chaplain  While rounding SDS waiting area, chaplain visited with Pt's daughter, Lyda Jester to find out if she had any questions or concerns. All was well with her.

## 2020-05-10 NOTE — H&P (Signed)
Maria Antigua, MD [address], [place], [place], [place], [address] [address], [place], [place], [place], [address] Phone: [phone]  Fax: [phone]  Primary Care Physician:  Crecencio Mc, MD   Pre-Procedure History & Physical: HPI:  Maria Hinton is a 74 y.o. female is here for a colonoscopy.   Past Medical History:  Diagnosis Date  . Actinic keratoses    follows with dermatology   . Arthritis   . Asthma   . Chronic back pain    DDD,herniated disc/spondylosis/radiculopathy  . COPD (chronic obstructive pulmonary disease) ([place])   . Diabetes mellitus    1980s  . Early cataracts, bilateral    Dr. Ruthine Dose - [facility]  . GERD (gastroesophageal reflux disease)    takes Omeprazole daily  . Hemorrhoids   . History of colonic polyps    colonoscopy 04/2012 - Dr Candace Cruise  . Hyperlipidemia    takes Pravastatin daily  . Hypertension    takes Metoprolol/HCTZ daily  . Hyperthyroidism   . Insomnia   . Joint pain    fingers  . Joint swelling   . Pneumonia    x 2 ;in Dec 2010/2011  . PONV (postoperative nausea and vomiting)   . Right leg weakness     Past Surgical History:  Procedure Laterality Date  . ABDOMINAL HYSTERECTOMY     at age 88  . BACK SURGERY  20+yrs ago  . CATARACT EXTRACTION W/PHACO Left 01/04/2019   Procedure: CATARACT EXTRACTION PHACO AND INTRAOCULAR LENS PLACEMENT ([place])  LEFT;  Surgeon: Leandrew Koyanagi, MD;  Location: [facility];  Service: Ophthalmology;  Laterality: Left;  GIVE IV ZOFRAN  . CATARACT EXTRACTION W/PHACO Right 02/01/2019   Procedure: CATARACT EXTRACTION PHACO AND INTRAOCULAR LENS PLACEMENT ([place])  RIGHT DIABETIC;  Surgeon: Leandrew Koyanagi, MD;  Location: [facility];  Service: Ophthalmology;  Laterality: Right;  DIABETIC GIVE IV ZOFRAN  . COLONOSCOPY    . COLONOSCOPY N/A 05/13/2015   Procedure: COLONOSCOPY;  Surgeon: Hulen Luster, MD;  Location: [facility] END[facility]COPY;  Service: Gastroenterology;   Laterality: N/A;  . ESOPHAGOGASTRODUODENOSCOPY  2011  . ESOPHAGOGASTRODUODENOSCOPY (EGD) WITH PROPOFOL N/A 09/02/2017   Procedure: ESOPHAGOGASTRODUODENOSCOPY (EGD) WITH PROPOFOL;  Surgeon: Virgel Manifold, MD;  Location: [facility] ENDOSCOPY;  Service: Endoscopy;  Laterality: N/A;  . FOOT SURGERY  2011   bunionectomy and knot removed from bottom of both feet  . JOINT REPLACEMENT Left 2014   hip  Kraskinski  . LUMBAR LAMINECTOMY/DECOMPRESSION MICRODISCECTOMY  09/10/2011   Procedure: LUMBAR LAMINECTOMY/DECOMPRESSION MICRODISCECTOMY 1 LEVEL;  Surgeon: Hosie Spangle, MD;  Location: [place] NEURO ORS;  Service: Neurosurgery;  Laterality: Right;  RIGHT Lumbar  Laminotomy and microdiskectomy Lumbar Four-Five  . LUMBAR LAMINECTOMY/DECOMPRESSION MICRODISCECTOMY N/A 06/26/2015   Procedure: LUMBAR LAMINECTOMY/DECOMPRESSION MICRODISCECTOMY 1 LEVEL;  Surgeon: Jovita Gamma, MD;  Location: [place] NEURO ORS;  Service: Neurosurgery;  Laterality: N/A;  L3 and L4 Laminectomies  . MELANOMA EXCISION  2011   on nose/back on neck  . THORACIC DISC SURGERY  at age 12 and 39    mass of veins that had gotten into muscle and wrapped around rib;3 ribs also removed from left side  . TONSILLECTOMY  as a child   and adenoids   . TOTAL HIP ARTHROPLASTY Left 2014   Dr. Mack Guise  . TUBAL LIGATION    . VAGINA SURGERY     vaginal wall ruptured     Prior to Admission medications   Medication Sig Start Date End Date Taking?  Authorizing Provider  albuterol (PROVENTIL HFA;VENTOLIN HFA) 108 (90 Base) MCG/ACT inhaler Inhale 2 puffs into the lungs every 6 (six) hours as needed for wheezing. 10/05/17  Yes Laverle Hobby, MD  ALPRAZolam Duanne Moron) 0.25 MG tablet TAKE 1 TABLET BY MOUTH TWICE A DAY AS NEEDED 03/25/20  Yes Crecencio Mc, MD  aspirin EC 81 MG tablet Take 81 mg by mouth daily.   Yes [provider]  atenolol (TENORMIN) 50 MG tablet TAKE 1 TABLET BY MOUTH TWICE A DAY 03/13/20  Yes Crecencio Mc, MD  atorvastatin  (LIPITOR) 40 MG tablet TAKE 1 TABLET BY MOUTH DAILY AT 6 PM. 08/18/19  Yes Crecencio Mc, MD  BD PEN NEEDLE NANO U/F 32G X 4 MM MISC USE DAILY AS DIRECTED WITH LEVEMIR 11/01/19  Yes Crecencio Mc, MD  gabapentin (NEURONTIN) 100 MG capsule Take 2 capsules (200 mg total) by mouth 3 (three) times daily. 10/10/19  Yes Crecencio Mc, MD  LEVEMIR FLEXTOUCH 100 UNIT/ML FlexPen INJECT 15 UNITS INTO THE SKIN DAILY AT 10 PM. 01/29/20  Yes Crecencio Mc, MD  levothyroxine (SYNTHROID) 112 MCG tablet Take 1 tablet (112 mcg total) by mouth daily. 04/22/20  Yes Elayne Snare, MD  losartan (COZAAR) 100 MG tablet Take 1 tablet (100 mg total) by mouth daily. 10/10/19  Yes Crecencio Mc, MD  metFORMIN (GLUCOPHAGE) 1000 MG tablet TAKE 1 TABLET BY MOUTH 2 TIMES DAILY WITH A MEAL. 11/06/19  Yes Crecencio Mc, MD  omeprazole (PRILOSEC) 40 MG capsule TAKE 1 CAPSULE BY MOUTH EVERY DAY 03/13/20  Yes Crecencio Mc, MD  OneTouch Delica Lancets 16X MISC 1 application by Does not apply route 2 (two) times daily. Use to check blood sugar up to two times daily. E11.69 04/18/19  Yes Crecencio Mc, MD  [facility] VERIO test strip USE AS INSTRUCTED 04/17/19  Yes Crecencio Mc, MD  traMADol (ULTRAM) 50 MG tablet TAKE 1 TABLET BY MOUTH EVERY 6 HOURS AS NEEDED FOR SPINAL STENOSIS PAIN 11/13/19  Yes Crecencio Mc, MD    Allergies as of 02/21/2020 - Review Complete 02/21/2020  Allergen Reaction Noted  . Vicodin [hydrocodone-acetaminophen] Other (See Comments) 09/08/2011    Family History  Problem Relation Age of Onset  . Heart disease Brother        valve replacement  . COPD Brother   . COPD Mother        Emphysema  . Diabetes Mother   . Other Mother        Died in house fire at age 65.  Marland Kitchen Heart disease Father   . Seizures Father   . Cancer Father        Lung cancer with brain metastasis  . Asthma Brother   . COPD Son 62  . Cancer Other        aunt ? maternal vs paternal side lung cancer   . Diabetes Maternal  Grandmother   . Anesthesia problems Neg Hx   . Hypotension Neg Hx   . Malignant hyperthermia Neg Hx   . Pseudochol deficiency Neg Hx   . Thyroid disease Neg Hx   . Breast cancer Neg Hx     Social History   Socioeconomic History  . Marital status: Widowed    Spouse name: Not on file  . Number of children: 2  . Years of education: 48  . Highest education level: High school graduate  Occupational History  . Occupation: Retired  Tobacco Use  .  Smoking status: Former Smoker    Packs/day: 0.25    Years: 20.00    Pack years: 5.00    Types: Cigarettes    Quit date: 2018    Years since quitting: 3.8  . Smokeless tobacco: Never Used  . Tobacco comment: Pt quit 11/08/2014.Pt had quit smoking for 13 years before starting back in 2010; per 05/2017 visit quit smoking 2017 smoked total 25 years 1 ppd   Vaping Use  . Vaping Use: Never used  Substance and Sexual Activity  . Alcohol use: No    Alcohol/week: 0.0 standard drinks  . Drug use: No  . Sexual activity: Never    Birth control/protection: Surgical  Other Topics Concern  . Not on file  Social History Narrative   Widowed.    Lives alone.   Right-handed.    2 cups caffeine per day.   Social Determinants of Health   Financial Resource Strain: Low Risk   . Difficulty of Paying Living Expenses: Not hard at all  Food Insecurity: No Food Insecurity  . Worried About Charity fundraiser in the Last Year: Never true  . Ran Out of Food in the Last Year: Never true  Transportation Needs: No Transportation Needs  . Lack of Transportation (Medical): No  . Lack of Transportation (Non-Medical): No  Physical Activity:   . Days of Exercise per Week: Not on file  . Minutes of Exercise per Session: Not on file  Stress: No Stress Concern Present  . Feeling of Stress : Not at all  Social Connections: Unknown  . Frequency of Communication with Friends and Family: More than three times a week  . Frequency of Social Gatherings with Friends  and Family: Once a week  . Attends Religious Services: Not on file  . Active Member of Clubs or Organizations: Yes  . Attends Archivist Meetings: Not on file  . Marital Status: Widowed  Intimate Partner Violence: Not At Risk  . Fear of Current or Ex-Partner: No  . Emotionally Abused: No  . Physically Abused: No  . Sexually Abused: No    Review of Systems: See HPI, otherwise negative ROS  Physical Exam: BP (!) 152/85   Pulse 64   Temp (!) 96.2 F (35.7 C) (Temporal)   Resp 20   Ht 5\' 4"  (1.626 m)   Wt 72.1 kg   SpO2 100%   BMI 27.29 kg/m  General:   Alert,  pleasant and cooperative in NAD Head:  Normocephalic and atraumatic. Neck:  Supple; no masses or thyromegaly. Lungs:  Clear throughout to auscultation, normal respiratory effort.    Heart:  +S1, +S2, Regular rate and rhythm, No edema. Abdomen:  Soft, nontender and nondistended. Normal bowel sounds, without guarding, and without rebound.   Neurologic:  Alert and  oriented x4;  grossly normal neurologically.  Impression/Plan: Maria Hinton is here for a colonoscopy to be performed for history of polyps.  Risks, benefits, limitations, and alternatives regarding  colonoscopy have been reviewed with the patient.  Questions have been answered.  All parties agreeable.   Virgel Manifold, MD  05/10/2020, 8:51 AM

## 2020-05-10 NOTE — Op Note (Signed)
[facility] Patient Name: Maria Hinton Procedure Date: 05/10/2020 8:23 AM MRN: [id number] Account #: [id number] Date of Birth: 10-Aug-1945 Admit Type: Outpatient Age: 74 Room: [facility] ENDO ROOM 2 Gender: Female Note Status: Finalized Procedure:             Colonoscopy Indications:           High risk colon cancer surveillance: Personal history                         of colonic polyps, Incidental diarrhea noted Providers:              B. Bonna Gains MD, MD Referring MD:          Deborra Medina, MD (Referring MD) Medicines:             Monitored Anesthesia Care Complications:         No immediate complications. Procedure:             Pre-Anesthesia Assessment:                        - ASA Grade Assessment: II - A patient with mild                         systemic disease.                        - Prior to the procedure, a History and Physical was                         performed, and patient medications, allergies and                         sensitivities were reviewed. The patient's tolerance                         of previous anesthesia was reviewed.                        - The risks and benefits of the procedure and the                         sedation options and risks were discussed with the                         patient. All questions were answered and informed                         consent was obtained.                        - Patient identification and proposed procedure were                         verified prior to the procedure by the physician, the                         nurse, the anesthesiologist, the anesthetist and the                         technician.  The procedure was verified in the                         procedure room.                        After obtaining informed consent, the colonoscope was                         passed under direct vision. Throughout the procedure,                         the patient's blood  pressure, pulse, and oxygen                         saturations were monitored continuously. The                         Colonoscope was introduced through the anus and                         advanced to the the cecum, identified by appendiceal                         orifice and ileocecal valve. The colonoscopy was                         performed with ease. The patient tolerated the                         procedure well. The quality of the bowel preparation                         was good. Findings:      The perianal exam findings include non-thrombosed external hemorrhoids.      Two sessile polyps were found in the ascending colon and cecum. The       polyps were 3 to 4 mm in size. These polyps were removed with a cold       biopsy forceps. Resection and retrieval were complete.      Four sessile polyps were found in the ascending colon. The polyps were 5       to 7 mm in size. These polyps were removed with a cold snare. Resection       and retrieval were complete.      A 6 mm polyp was found in the sigmoid colon. The polyp was sessile. The       polyp was removed with a cold snare. Resection and retrieval were       complete.      The exam was otherwise without abnormality.      The rectum, sigmoid colon, descending colon, transverse colon, ascending       colon and cecum appeared normal. Biopsies for histology were taken with       a cold forceps from the entire colon for evaluation of microscopic       colitis.      Non-bleeding internal hemorrhoids were found during retroflexion. Impression:            - Non-thrombosed external hemorrhoids found on  perianal exam.                        - Two 3 to 4 mm polyps in the ascending colon and in                         the cecum, removed with a cold biopsy forceps.                         Resected and retrieved.                        - Four 5 to 7 mm polyps in the ascending colon,                          removed with a cold snare. Resected and retrieved.                        - One 6 mm polyp in the sigmoid colon, removed with a                         cold snare. Resected and retrieved.                        - The examination was otherwise normal.                        - The rectum, sigmoid colon, descending colon,                         transverse colon, ascending colon and cecum are                         normal. Biopsied.                        - Non-bleeding internal hemorrhoids. Recommendation:        - Discharge patient to home (with escort).                        - Advance diet as tolerated.                        - Continue present medications.                        - Await pathology results.                        - Repeat colonoscopy in 3 years.                        - The findings and recommendations were discussed with                         the patient.                        - The findings and recommendations were discussed with  the patient's family.                        - Return to primary care physician as previously                         scheduled.                        - High fiber diet.                        - - Obtain referral for pelvic floor physical therapy                         with PCP for fecal incontinence                        - Referral to general surgery for large external                         hemorrhoids                        - Follow up in clinic in 4-6 weeks Procedure Code(s):     --- Professional ---                        [id number], Colonoscopy, flexible; with removal of                         tumor(s), polyp(s), or other lesion(s) by snare                         technique                        45380, 59, Colonoscopy, flexible; with biopsy, single                         or multiple Diagnosis Code(s):     --- Professional ---                        K63.5, Polyp of colon                        Z86.010,  Personal history of colonic polyps CPT copyright 2019 American Medical Association. All rights reserved. The codes documented in this report are preliminary and upon coder review may  be revised to meet current compliance requirements.  Vonda Antigua, MD Margretta Sidle B. Bonna Gains MD, MD 05/10/2020 9:36:10 AM This report has been signed electronically. Number of Addenda: 0 Note Initiated On: 05/10/2020 8:23 AM Scope Withdrawal Time: 0 hours 27 minutes 34 seconds  Total Procedure Duration: 0 hours 31 minutes 2 seconds  Estimated Blood Loss:  Estimated blood loss: none.      [facility]

## 2020-05-10 NOTE — Anesthesia Preprocedure Evaluation (Signed)
Anesthesia Evaluation  Patient identified by MRN, date of birth, ID band Patient awake    Reviewed: Allergy & Precautions, NPO status , Patient's Chart, lab work & pertinent test results  History of Anesthesia Complications (+) PONV and history of anesthetic complications  Airway Mallampati: III  TM Distance: >3 FB Neck ROM: Full    Dental no notable dental hx.    Pulmonary asthma , COPD,  COPD inhaler, former smoker,    breath sounds clear to auscultation- rhonchi (-) wheezing      Cardiovascular hypertension, Pt. on medications (-) CAD, (-) Past MI, (-) Cardiac Stents and (-) CABG  Rhythm:Regular Rate:Normal - Systolic murmurs and - Diastolic murmurs    Neuro/Psych neg Seizures PSYCHIATRIC DISORDERS Anxiety negative neurological ROS     GI/Hepatic Neg liver ROS, hiatal hernia, GERD  ,  Endo/Other  diabetes, Insulin DependentHypothyroidism   Renal/GU negative Renal ROS     Musculoskeletal  (+) Arthritis ,   Abdominal (+) - obese,   Peds  Hematology  (+) anemia ,   Anesthesia Other Findings Past Medical History: No date: Actinic keratoses     Comment:  follows with dermatology  No date: Arthritis No date: Asthma No date: Chronic back pain     Comment:  DDD,herniated disc/spondylosis/radiculopathy No date: COPD (chronic obstructive pulmonary disease) (HCC) No date: Diabetes mellitus     Comment:  1980s No date: Early cataracts, bilateral     Comment:  Dr. [name] [place] No date: GERD (gastroesophageal reflux disease)     Comment:  takes Omeprazole daily No date: Hemorrhoids No date: History of colonic polyps     Comment:  colonoscopy 04/2012 - Dr Candace Cruise No date: Hyperlipidemia     Comment:  takes Pravastatin daily No date: Hypertension     Comment:  takes Metoprolol/HCTZ daily No date: Hyperthyroidism No date: Insomnia No date: Joint pain     Comment:  fingers No date: Joint  swelling No date: Pneumonia     Comment:  x 2 ;in Dec 2010/2011 No date: PONV (postoperative nausea and vomiting) No date: Right leg weakness   Reproductive/Obstetrics                             Anesthesia Physical Anesthesia Plan  ASA: III  Anesthesia Plan: General   Post-op Pain Management:    Induction: Intravenous  PONV Risk Score and Plan: 3 and Propofol infusion  Airway Management Planned: Natural Airway  Additional Equipment:   Intra-op Plan:   Post-operative Plan:   Informed Consent: I have reviewed the patients History and Physical, chart, labs and discussed the procedure including the risks, benefits and alternatives for the proposed anesthesia with the patient or authorized representative who has indicated his/her understanding and acceptance.     Dental advisory given  Plan Discussed with: CRNA and Anesthesiologist  Anesthesia Plan Comments:         Anesthesia Quick Evaluation

## 2020-05-10 NOTE — Anesthesia Postprocedure Evaluation (Signed)
Anesthesia Post Note  Patient: Maria Hinton  Procedure(s) Performed: COLONOSCOPY WITH PROPOFOL (N/A )  Patient location during evaluation: Endoscopy Anesthesia Type: General Level of consciousness: awake and alert and oriented Pain management: pain level controlled Vital Signs Assessment: post-procedure vital signs reviewed and stable Respiratory status: spontaneous breathing, nonlabored ventilation and respiratory function stable Cardiovascular status: blood pressure returned to baseline and stable Postop Assessment: no signs of nausea or vomiting Anesthetic complications: no   No complications documented.   Last Vitals:  Vitals:   05/10/20 0941 05/10/20 0951  BP: (!) 148/67 (!) 155/67  Pulse: (!) 50 (!) 53  Resp: 12 (!) 8  Temp:    SpO2: 99% 99%    Last Pain:  Vitals:   05/10/20 0951  TempSrc:   PainSc: 0-No pain                  

## 2020-05-13 ENCOUNTER — Telehealth: Payer: Self-pay

## 2020-05-13 ENCOUNTER — Encounter: Payer: Self-pay | Admitting: Gastroenterology

## 2020-05-13 ENCOUNTER — Other Ambulatory Visit: Payer: Self-pay | Admitting: Internal Medicine

## 2020-05-13 DIAGNOSIS — E781 Pure hyperglyceridemia: Secondary | ICD-10-CM

## 2020-05-13 DIAGNOSIS — R748 Abnormal levels of other serum enzymes: Secondary | ICD-10-CM

## 2020-05-13 DIAGNOSIS — E1140 Type 2 diabetes mellitus with diabetic neuropathy, unspecified: Secondary | ICD-10-CM

## 2020-05-13 DIAGNOSIS — E890 Postprocedural hypothyroidism: Secondary | ICD-10-CM

## 2020-05-13 LAB — SURGICAL PATHOLOGY

## 2020-05-13 NOTE — Telephone Encounter (Signed)
Labs have been ordered

## 2020-05-13 NOTE — Telephone Encounter (Signed)
Pt is scheduled to have labs done for Dr. Dwyane Dee here at our office on Dec. 9th is there anything that you would like to have drawn while she is here.

## 2020-05-13 NOTE — Telephone Encounter (Signed)
Ok thanks,  a1c added

## 2020-05-13 NOTE — Telephone Encounter (Signed)
No he wants it done closer to her appt with him in Dec so the lab appt is Dec 9th.

## 2020-05-13 NOTE — Telephone Encounter (Signed)
Did you mean Nov 9th?  If so,  it'sToo soon for a1c but lipid and cmet added

## 2020-05-13 NOTE — Telephone Encounter (Signed)
Pt states that Dr Dwyane Dee wants her to have blood work done here. Please call pt to advise

## 2020-05-19 ENCOUNTER — Other Ambulatory Visit: Payer: Self-pay | Admitting: Internal Medicine

## 2020-05-27 ENCOUNTER — Other Ambulatory Visit: Payer: Self-pay | Admitting: Internal Medicine

## 2020-05-28 NOTE — Telephone Encounter (Signed)
LS: 02-20-20 LO:11-13-19

## 2020-05-29 ENCOUNTER — Other Ambulatory Visit: Payer: Self-pay | Admitting: Internal Medicine

## 2020-05-29 ENCOUNTER — Other Ambulatory Visit: Payer: Self-pay | Admitting: Endocrinology

## 2020-05-29 NOTE — Telephone Encounter (Signed)
Pt needs refills on tramadol and atenolol (TENORMIN) 50 MG tablet

## 2020-06-13 ENCOUNTER — Other Ambulatory Visit (INDEPENDENT_AMBULATORY_CARE_PROVIDER_SITE_OTHER): Payer: Medicare HMO

## 2020-06-13 ENCOUNTER — Other Ambulatory Visit: Payer: Self-pay

## 2020-06-13 DIAGNOSIS — E1140 Type 2 diabetes mellitus with diabetic neuropathy, unspecified: Secondary | ICD-10-CM | POA: Diagnosis not present

## 2020-06-13 DIAGNOSIS — R748 Abnormal levels of other serum enzymes: Secondary | ICD-10-CM | POA: Diagnosis not present

## 2020-06-13 DIAGNOSIS — E781 Pure hyperglyceridemia: Secondary | ICD-10-CM | POA: Diagnosis not present

## 2020-06-13 DIAGNOSIS — E890 Postprocedural hypothyroidism: Secondary | ICD-10-CM

## 2020-06-13 DIAGNOSIS — Z794 Long term (current) use of insulin: Secondary | ICD-10-CM | POA: Diagnosis not present

## 2020-06-13 LAB — LIPID PANEL
Cholesterol: 138 mg/dL (ref 0–200)
HDL: 51 mg/dL (ref >39.00)
LDL Cholesterol: 55 mg/dL (ref 0–99)
NonHDL: 87.03
Total CHOL/HDL Ratio: 3
Triglycerides: 160 mg/dL — ABNORMAL HIGH (ref 0.0–149.0)
VLDL: 32 mg/dL (ref 0.0–40.0)

## 2020-06-13 LAB — COMPREHENSIVE METABOLIC PANEL
ALT: 9 U/L (ref 0–35)
AST: 11 U/L (ref 0–37)
Albumin: 4.1 g/dL (ref 3.5–5.2)
Alkaline Phosphatase: 103 U/L (ref 39–117)
BUN: 19 mg/dL (ref 6–23)
CO2: 33 mEq/L — ABNORMAL HIGH (ref 19–32)
Calcium: 8.7 mg/dL (ref 8.4–10.5)
Chloride: 103 mEq/L (ref 96–112)
Creatinine, Ser: 0.97 mg/dL (ref 0.40–1.20)
GFR: 58.45 mL/min — ABNORMAL LOW (ref >60.00)
Glucose, Bld: 190 mg/dL — ABNORMAL HIGH (ref 70–99)
Potassium: 4.3 mEq/L (ref 3.5–5.1)
Sodium: 144 mEq/L (ref 135–145)
Total Bilirubin: 0.4 mg/dL (ref 0.2–1.2)
Total Protein: 6.7 g/dL (ref 6.0–8.3)

## 2020-06-13 LAB — TSH: TSH: 1.1 u[IU]/mL (ref 0.35–4.50)

## 2020-06-13 LAB — HEMOGLOBIN A1C: Hgb A1c MFr Bld: 7.5 % — ABNORMAL HIGH (ref 4.6–6.5)

## 2020-06-13 LAB — T4, FREE: Free T4: 0.87 ng/dL (ref 0.60–1.60)

## 2020-06-16 NOTE — Progress Notes (Signed)
Thyroid function is  now normal  on current dose.  No current changes needed.   However, diabetes jas lost control  please schedule follow up appt to address   Regards,   Deborra Medina, MD

## 2020-06-17 MED ORDER — ATENOLOL 50 MG PO TABS
50.0000 mg | ORAL_TABLET | Freq: Two times a day (BID) | ORAL | 1 refills | Status: DC
Start: 2020-06-17 — End: 2021-01-22

## 2020-06-18 ENCOUNTER — Other Ambulatory Visit: Payer: Self-pay

## 2020-06-18 ENCOUNTER — Telehealth (INDEPENDENT_AMBULATORY_CARE_PROVIDER_SITE_OTHER): Payer: Medicare HMO | Admitting: Endocrinology

## 2020-06-18 DIAGNOSIS — E890 Postprocedural hypothyroidism: Secondary | ICD-10-CM

## 2020-06-18 NOTE — Progress Notes (Signed)
Patient ID: Maria Hinton, female   DOB: Oct 06, 1945, 74 y.o.   MRN: [id number]                                                                                                             Today's office visit was provided via telemedicine using a telephone call to the patient Patient has been explained the limitations of evaluation and management by telemedicine and the availability of in person appointments.  The patient understood the limitations and agreed to proceed. Patient also understood that the telehealth visit is billable. . Location of the patient: Home . Location of the provider: Office Only the patient and myself were participating in the encounter   Reason for Appointment:  follow-up visit     History of Present Illness:   Prior history of hyperthyroidism:  For several months before her consultation she had symptoms of weakness, palpitations, shakiness, feeling sweaty in am, nervousness, and fatigue.  Also would feel a strong heartbeat in her ears She had been on atenolol for hypertension also She had a decreased appetite and started losing weight late last summer The patient had lost about  26 lbs since these symptoms started   She started taking methimazole in 09/2017 for her hyperthyroidism On her 10/2017 visit she was taking 10 mg twice daily because of hyperthyroidism she was given 30 mg daily In late 2019 she appeared to be needing progressively higher doses of methimazole  On her visit in 12/19 she was having fatigue and weakness; did not complain of feeling shaky or having palpitations, no weight loss However her thyroid levels were significantly high with 7.5 mg of methimazole and the dose was further adjusted  She again had a relapse in 5/21 without methimazole  RECENT history:  She had I-131 treatment on 01/02/2020 with 25 mCi; the dose was prescribed by the radiologist This was done because of recurrent or persistent hyperthyroidism  On her last visit 6  weeks ago she was feeling fairly good except for feeling some cold intolerance but also gaining some weight Also had some night sweats  She was significantly hypothyroid with free T4 0.42 She is now taking Synthroid 112 mcg every morning before breakfast, taking this with a full glass of water 60 minutes before eating She has not missed any doses She feels fairly good with no fatigue or cold intolerance Her weight is back down to 157  Labs are showing the free T4 level to be significantly lower at only 0.4 with higher TSH at 33   Wt Readings from Last 3 Encounters:  05/10/20 159 lb (72.1 kg)  04/22/20 162 lb 12.8 oz (73.8 kg)  03/07/20 157 lb 6.4 oz (71.4 kg)     Thyrotropin receptor antibody slightly above normal as of 4/21   Thyroid function tests as follows:     Lab Results  Component Value Date   FREET4 0.87 06/13/2020   FREET4 0.42 (L) 04/18/2020   FREET4 1.51 02/29/2020   T3FREE 2.5 04/18/2020   T3FREE 4.2 11/27/2019  T3FREE 2.8 10/11/2019   TSH 1.10 06/13/2020   TSH 33.14 (H) 04/18/2020   TSH <0.01 (L) 02/29/2020    Lab Results  Component Value Date   THYROTRECAB 2.08 (H) 10/11/2019   THYROTRECAB 1.66 07/13/2019   THYROTRECAB 1.79 (H) 03/30/2019     Allergies as of 06/18/2020      Reactions   Vicodin [hydrocodone-acetaminophen] Other (See Comments)   Insomnia       Medication List       Accurate as of June 18, 2020 10:13 AM. If you have any questions, ask your nurse or doctor.        albuterol 108 (90 Base) MCG/ACT inhaler Commonly known as: VENTOLIN HFA Inhale 2 puffs into the lungs every 6 (six) hours as needed for wheezing.   ALPRAZolam 0.25 MG tablet Commonly known as: XANAX TAKE 1 TABLET BY MOUTH TWICE A DAY AS NEEDED   aspirin EC 81 MG tablet Take 81 mg by mouth daily.   atenolol 50 MG tablet Commonly known as: TENORMIN Take 1 tablet (50 mg total) by mouth 2 (two) times daily.   atorvastatin 40 MG tablet Commonly known as:  LIPITOR TAKE 1 TABLET BY MOUTH DAILY AT 6 PM.   BD Pen Needle Nano U/F 32G X 4 MM Misc Generic drug: Insulin Pen Needle USE DAILY AS DIRECTED WITH LEVEMIR   gabapentin 100 MG capsule Commonly known as: NEURONTIN TAKE 2 CAPSULES (200 MG TOTAL) BY MOUTH 3 (THREE) TIMES DAILY.   Levemir FlexTouch 100 UNIT/ML FlexPen Generic drug: insulin detemir INJECT 15 UNITS INTO THE SKIN DAILY AT 10 PM.   levothyroxine 112 MCG tablet Commonly known as: SYNTHROID TAKE 1 TABLET BY MOUTH EVERY DAY   losartan 100 MG tablet Commonly known as: COZAAR Take 1 tablet (100 mg total) by mouth daily.   metFORMIN 1000 MG tablet Commonly known as: GLUCOPHAGE TAKE 1 TABLET BY MOUTH 2 TIMES DAILY WITH A MEAL.   omeprazole 40 MG capsule Commonly known as: PRILOSEC TAKE 1 CAPSULE BY MOUTH EVERY DAY   OneTouch Delica Lancets 16X Misc 1 application by Does not apply route 2 (two) times daily. Use to check blood sugar up to two times daily. E11.69   OneTouch Verio test strip Generic drug: glucose blood USE AS INSTRUCTED   traMADol 50 MG tablet Commonly known as: ULTRAM TAKE 1 TABLET BY MOUTH EVERY 6 HOURS AS NEEDED FOR SPINAL STENOSIS PAIN           Past Medical History:  Diagnosis Date  . Actinic keratoses    follows with dermatology   . Arthritis   . Asthma   . Chronic back pain    DDD,herniated disc/spondylosis/radiculopathy  . COPD (chronic obstructive pulmonary disease) ([place])   . Diabetes mellitus    1980s  . Early cataracts, bilateral    Dr. Ruthine Dose - [facility] [place]  . GERD (gastroesophageal reflux disease)    takes Omeprazole daily  . Hemorrhoids   . History of colonic polyps    colonoscopy 04/2012 - Dr Candace Cruise  . Hyperlipidemia    takes Pravastatin daily  . Hypertension    takes Metoprolol/HCTZ daily  . Hyperthyroidism   . Insomnia   . Joint pain    fingers  . Joint swelling   . Pneumonia    x 2 ;in Dec 2010/2011  . PONV (postoperative nausea and vomiting)   . Right  leg weakness     Past Surgical History:  Procedure Laterality Date  . ABDOMINAL HYSTERECTOMY  at age 62  . BACK SURGERY  20+yrs ago  . CATARACT EXTRACTION W/PHACO Left 01/04/2019   Procedure: CATARACT EXTRACTION PHACO AND INTRAOCULAR LENS PLACEMENT ([facility])  LEFT;  Surgeon: Leandrew Koyanagi, MD;  Location: [place];  Service: Ophthalmology;  Laterality: Left;  GIVE IV ZOFRAN  . CATARACT EXTRACTION W/PHACO Right 02/01/2019   Procedure: CATARACT EXTRACTION PHACO AND INTRAOCULAR LENS PLACEMENT ([place])  RIGHT DIABETIC;  Surgeon: Leandrew Koyanagi, MD;  Location: [place];  Service: Ophthalmology;  Laterality: Right;  DIABETIC GIVE IV ZOFRAN  . COLONOSCOPY    . COLONOSCOPY N/A 05/13/2015   Procedure: COLONOSCOPY;  Surgeon: Hulen Luster, MD;  Location: [facility] ENDOSCOPY;  Service: Gastroenterology;  Laterality: N/A;  . COLONOSCOPY WITH PROPOFOL N/A 05/10/2020   Procedure: COLONOSCOPY WITH PROPOFOL;  Surgeon: Virgel Manifold, MD;  Location: [facility] ENDOSCOPY;  Service: Endoscopy;  Laterality: N/A;  . ESOPHAGOGASTRODUODENOSCOPY  2011  . ESOPHAGOGASTRODUODENOSCOPY (EGD) WITH PROPOFOL N/A 09/02/2017   Procedure: ESOPHAGOGASTRODUODENOSCOPY (EGD) WITH PROPOFOL;  Surgeon: Virgel Manifold, MD;  Location: [facility] ENDOSCOPY;  Service: Endoscopy;  Laterality: N/A;  . FOOT SURGERY  2011   bunionectomy and knot removed from bottom of both feet  . JOINT REPLACEMENT Left 2014   hip  Kraskinski  . LUMBAR LAMINECTOMY/DECOMPRESSION MICRODISCECTOMY  09/10/2011   Procedure: LUMBAR LAMINECTOMY/DECOMPRESSION MICRODISCECTOMY 1 LEVEL;  Surgeon: Hosie Spangle, MD;  Location: [place] NEURO ORS;  Service: Neurosurgery;  Laterality: Right;  RIGHT Lumbar  Laminotomy and microdiskectomy Lumbar Four-Five  . LUMBAR LAMINECTOMY/DECOMPRESSION MICRODISCECTOMY N/A 06/26/2015   Procedure: LUMBAR LAMINECTOMY/DECOMPRESSION MICRODISCECTOMY 1 LEVEL;  Surgeon: Jovita Gamma, MD;  Location: [place] NEURO ORS;  Service:  Neurosurgery;  Laterality: N/A;  L3 and L4 Laminectomies  . MELANOMA EXCISION  2011   on nose/back on neck  . THORACIC DISC SURGERY  at age 29 and 77    mass of veins that had gotten into muscle and wrapped around rib;3 ribs also removed from left side  . TONSILLECTOMY  as a child   and adenoids   . TOTAL HIP ARTHROPLASTY Left 2014   Dr. Mack Guise  . TUBAL LIGATION    . VAGINA SURGERY     vaginal wall ruptured     Family History  Problem Relation Age of Onset  . Heart disease Brother        valve replacement  . COPD Brother   . COPD Mother        Emphysema  . Diabetes Mother   . Other Mother        Died in house fire at age 34.  Marland Kitchen Heart disease Father   . Seizures Father   . Cancer Father        Lung cancer with brain metastasis  . Asthma Brother   . COPD Son 45  . Cancer Other        aunt ? maternal vs paternal side lung cancer   . Diabetes Maternal Grandmother   . Anesthesia problems Neg Hx   . Hypotension Neg Hx   . Malignant hyperthermia Neg Hx   . Pseudochol deficiency Neg Hx   . Thyroid disease Neg Hx   . Breast cancer Neg Hx     Social History:  reports that she quit smoking about 3 years ago. Her smoking use included cigarettes. She has a 5.00 pack-year smoking history. She has never used smokeless tobacco. She reports that she does not drink alcohol and does not use drugs.  Allergies:  Allergies  Allergen Reactions  .  Vicodin [Hydrocodone-Acetaminophen] Other (See Comments)    Insomnia      Review of Systems  She is on basal insulin with Levemir and Metformin for diabetes and followed by PCP She says that recently she is cutting back on carbohydrates like 70s A1c as follows   Lab Results  Component Value Date   HGBA1C 7.5 (H) 06/13/2020   HGBA1C 7.3 (A) 04/22/2020   HGBA1C 7.8 (H) 02/20/2020   Lab Results  Component Value Date   MICROALBUR 1.8 02/20/2020   LDLCALC 55 06/13/2020   CREATININE 0.97 06/13/2020      Examination:   There  were no vitals taken for this visit.  Patient reports her weight to be 157   Assessment/Plan:  Post ablative hypothyroidism:  She has had I-131 treatment On 01/02/2020  She has had hypothyroidism diagnosed in 10/21  She feels subjectively well and her thyroid levels are back to normal starting the levothyroxine  She will continue to take the levothyroxine 112 mcg and can take it 30-minute before breakfast    Follow-up in 4 months  Duration of telephone encounter =5 minutes  Elayne Snare 06/18/2020, 10:13 AM    Note: This office note was prepared with Dragon voice recognition system technology. Any transcriptional errors that result from this process are unintentional.

## 2020-06-26 ENCOUNTER — Other Ambulatory Visit: Payer: Self-pay | Admitting: Internal Medicine

## 2020-06-27 MED ORDER — OMEPRAZOLE 40 MG PO CPDR
DELAYED_RELEASE_CAPSULE | ORAL | 1 refills | Status: DC
Start: 2020-06-27 — End: 2021-04-14

## 2020-06-27 NOTE — Telephone Encounter (Signed)
SU Refill:Xanax Last Seen:02-20-20 Last ordered:03-25-20

## 2020-07-15 ENCOUNTER — Telehealth: Payer: Self-pay | Admitting: Internal Medicine

## 2020-07-15 NOTE — Telephone Encounter (Signed)
Patient has an up coming appt on 2/2/222 and would like to do labs before appt. No lab orders. Please let me know if I can schedule lab appointment 2 days before appointment.

## 2020-07-17 NOTE — Telephone Encounter (Signed)
What labs does patient need to have for appt? I can get her scheduled once ordered

## 2020-07-17 NOTE — Telephone Encounter (Signed)
They are already ordered .  Just thyroid

## 2020-07-18 ENCOUNTER — Telehealth: Payer: Self-pay | Admitting: Internal Medicine

## 2020-07-18 NOTE — Telephone Encounter (Signed)
Lm on vm that per Dr. Derrel Nip patient does not need labs before her next appointment.

## 2020-08-07 ENCOUNTER — Ambulatory Visit (INDEPENDENT_AMBULATORY_CARE_PROVIDER_SITE_OTHER): Payer: Medicare HMO | Admitting: Internal Medicine

## 2020-08-07 ENCOUNTER — Encounter: Payer: Self-pay | Admitting: Internal Medicine

## 2020-08-07 ENCOUNTER — Other Ambulatory Visit: Payer: Self-pay

## 2020-08-07 ENCOUNTER — Telehealth: Payer: Self-pay | Admitting: Internal Medicine

## 2020-08-07 VITALS — BP 112/70 | HR 85 | Temp 98.7°F | Ht 64.02 in | Wt 161.6 lb

## 2020-08-07 DIAGNOSIS — E1140 Type 2 diabetes mellitus with diabetic neuropathy, unspecified: Secondary | ICD-10-CM

## 2020-08-07 DIAGNOSIS — D126 Benign neoplasm of colon, unspecified: Secondary | ICD-10-CM | POA: Diagnosis not present

## 2020-08-07 DIAGNOSIS — F5105 Insomnia due to other mental disorder: Secondary | ICD-10-CM

## 2020-08-07 DIAGNOSIS — Z794 Long term (current) use of insulin: Secondary | ICD-10-CM

## 2020-08-07 DIAGNOSIS — F419 Anxiety disorder, unspecified: Secondary | ICD-10-CM | POA: Diagnosis not present

## 2020-08-07 DIAGNOSIS — F320 Major depressive disorder, single episode, mild: Secondary | ICD-10-CM

## 2020-08-07 DIAGNOSIS — E1110 Type 2 diabetes mellitus with ketoacidosis without coma: Secondary | ICD-10-CM | POA: Diagnosis not present

## 2020-08-07 MED ORDER — TEMAZEPAM 15 MG PO CAPS: 15.0000 mg | ORAL_CAPSULE | Freq: Every evening | ORAL | 2 refills | Status: DC | PRN

## 2020-08-07 MED ORDER — BUPROPION HCL ER (XL) 150 MG PO TB24: 150.0000 mg | ORAL_TABLET | Freq: Every day | ORAL | 5 refills | Status: DC

## 2020-08-07 NOTE — Telephone Encounter (Signed)
Maria Hinton Maria Hinton - PA Case ID: [id number] - Rx #: [id number]  Temazepam 15MG  capsules awaiting results.

## 2020-08-07 NOTE — Progress Notes (Addendum)
Subjective:  Patient ID: Maria Hinton, female    DOB: [birth year]  Age: 75 y.o. MRN: [id number]  CC: The primary encounter diagnosis was Uncontrolled type 2 diabetes mellitus with ketoacidosis without coma, without long-term current use of insulin ([place]). Diagnoses of Tubular adenoma of colon, Type 2 diabetes mellitus with diabetic neuropathy, with long-term current use of insulin ([place]), Insomnia secondary to anxiety, and Current mild episode of major depressive disorder without prior episode [facility]) were also pertinent to this visit.  HPI [name] presents for follow up on chronic issues   This visit occurred during the SARS-CoV-2 public health emergency.  Safety protocols were in place, including screening questions prior to the visit, additional usage of staff PPE, and extensive cleaning of exam room while observing appropriate contact time as indicated for disinfecting solutions.   T2DM  Loss of control by recent a1c noted.  She has been eating a bowl of fruit (berries)  for breakfast plus hard boiled eggs. Lunch is an apple or nothing at all    Supper is a lean cuisine microwaved meal,  Or cottage cheese with peaches. Fasting sugars  89 to 135 .  Not checking 2 hours post prandial.   GAD/depression:  grievng loss of husband Maria Hinton 13 yrs ago , mother 17 yrs ago  And more recent her grandson who shot himself in September with no warning .  She and her family  Have been devastated.  She has been taking 0.5 mg alprazolam every night ,  And Waking up at 3:30 am for 2 hours.  She ran out about a week ago .  Has been "jittery without it"   Has not been sleeping.  .     Review of Systems;  Patient denies headache, fevers, malaise, unintentional weight loss, skin rash, eye pain, sinus congestion and sinus pain, sore throat, dysphagia,  hemoptysis , cough, dyspnea, wheezing, chest pain, palpitations, orthopnea, edema, abdominal pain, nausea, melena, diarrhea, constipation, flank pain, dysuria,  hematuria, urinary  Frequency, nocturia, numbness, tingling, seizures,  Focal weakness, Loss of consciousness,  Tremor, insomnia, depression, anxiety, and suicidal ideation.      Objective:  BP 112/70 (BP Location: Left Arm, Patient Position: Sitting)   Pulse 85   Temp 98.7 F (37.1 C)   Ht 5' 4.02" (1.626 m)   Wt 161 lb 9.6 oz (73.3 kg)   SpO2 96%   BMI 27.72 kg/m   BP Readings from Last 3 Encounters:  08/07/20 112/70  05/10/20 (!) 155/67  04/22/20 (!) 136/96    Wt Readings from Last 3 Encounters:  08/07/20 161 lb 9.6 oz (73.3 kg)  05/10/20 159 lb (72.1 kg)  04/22/20 162 lb 12.8 oz (73.8 kg)    General appearance: alert, cooperative and appears stated age Ears: normal TM's and external ear canals both ears Throat: lips, mucosa, and tongue normal; teeth and gums normal Neck: no adenopathy, no carotid bruit, supple, symmetrical, trachea midline and thyroid not enlarged, symmetric, no tenderness/mass/nodules Back: symmetric, no curvature. ROM normal. No CVA tenderness. Lungs: clear to auscultation bilaterally Heart: regular rate and rhythm, S1, S2 normal, no murmur, click, rub or gallop Abdomen: soft, non-tender; bowel sounds normal; no masses,  no organomegaly Pulses: 2+ and symmetric Skin: Skin color, texture, turgor normal. No rashes or lesions Lymph nodes: Cervical, supraclavicular, and axillary nodes normal.  Lab Results  Component Value Date   HGBA1C 7.5 (H) 06/13/2020   HGBA1C 7.3 (A) 04/22/2020   HGBA1C 7.8 (H) 02/20/2020  Lab Results  Component Value Date   CREATININE 0.97 06/13/2020   CREATININE 0.83 02/20/2020   CREATININE 0.98 08/30/2019    Lab Results  Component Value Date   WBC 6.4 04/21/2018   HGB 14.5 04/21/2018   HCT 42.7 04/21/2018   PLT 324.0 04/21/2018   GLUCOSE 190 (H) 06/13/2020   CHOL 138 06/13/2020   TRIG 160.0 (H) 06/13/2020   HDL 51.00 06/13/2020   LDLDIRECT 67.0 02/20/2020   LDLCALC 55 06/13/2020   ALT 9 06/13/2020   AST 11  06/13/2020   NA 144 06/13/2020   K 4.3 06/13/2020   CL 103 06/13/2020   CREATININE 0.97 06/13/2020   BUN 19 06/13/2020   CO2 33 (H) 06/13/2020   TSH 1.10 06/13/2020   INR 0.88 02/17/2018   HGBA1C 7.5 (H) 06/13/2020   MICROALBUR 1.8 02/20/2020    No results found.  Assessment & Plan:   Problem List Items Addressed This Visit      Unprioritized   Insomnia secondary to anxiety    Previously managed with 0.5 mg alprazolam.  Has been out of medication x 1 week.  Will initiate trial of restoril to reduce alprazolam dependence.       Relevant Medications   buPROPion (WELLBUTRIN XL) 150 MG 24 hr tablet   Major depressive disorder with current active episode    Starting wellbutrin for management of negative symptoms (anhedonia, fatigue) .  Continue cymbalta. RTC one moth       Relevant Medications   buPROPion (WELLBUTRIN XL) 150 MG 24 hr tablet   Tubular adenoma of colon    Multiple,  By Nov 2021 colonoscopy,   3 yr follow up due in Nov 2024      Type 2 diabetes mellitus with diabetic neuropathy, unspecified ([place])    a1c unchanged and still above goal .  She is now taking levemir 15 units,  And metformin.  Asked to check post prandials and return in one month for A1c. continue  Losartan , atorvastatin and gabapentin for neuropathy.    Lab Results  Component Value Date   HGBA1C 7.5 (H) 06/13/2020   Lab Results  Component Value Date   MICROALBUR 1.8 02/20/2020   MICROALBUR 0.7 04/14/2019          RESOLVED: Uncontrolled type 2 diabetes mellitus with ketoacidosis without coma, without long-term current use of insulin ([facility]) - Primary   Relevant Orders   Comprehensive metabolic panel   Lipid panel   Hemoglobin A1c      I have discontinued Louana F. Marzette's ALPRAZolam. I am also having her start on temazepam and buPROPion. Additionally, I am having her maintain her aspirin EC, albuterol, OneTouch Verio, OneTouch Delica Lancets 60F, atorvastatin, losartan, Levemir  FlexTouch, BD Pen Needle Nano U/F, metFORMIN, traMADol, levothyroxine, gabapentin, atenolol, and omeprazole.  Meds ordered this encounter  Medications  . temazepam (RESTORIL) 15 MG capsule    Sig: Take 1 capsule (15 mg total) by mouth at bedtime as needed for sleep.    Dispense:  30 capsule    Refill:  2  . buPROPion (WELLBUTRIN XL) 150 MG 24 hr tablet    Sig: Take 1 tablet (150 mg total) by mouth daily.    Dispense:  30 tablet    Refill:  5    Medications Discontinued During This Encounter  Medication Reason  . ALPRAZolam (XANAX) 0.25 MG tablet     I spent 30 minutes dedicated to the care of this patient on the date of  this encounter to include pre-visit review of his medical history,  Face-to-face time with the patient , and post visit ordering of testing and therapeutics.  Follow-up: No follow-ups on file.   Crecencio Mc, MD

## 2020-08-07 NOTE — Assessment & Plan Note (Signed)
Multiple,  By Nov 2021 colonoscopy,   3 yr follow up due in Nov 2024

## 2020-08-07 NOTE — Patient Instructions (Signed)
  I am prescribing temazepam to use instead of alprazolam for insomnia. It is longer acting   Take the medication 30 minutes before bedtime   I am prescribing wellbutrin to take in the morning to help with your depression.  It will give you some energy   Please return in one month,  After you have repeat labs (march 10 or later)  Continue metformin and Levemir    Check your blood sugars 2 hours after eating once daily

## 2020-08-08 ENCOUNTER — Telehealth: Payer: Self-pay

## 2020-08-08 DIAGNOSIS — F329 Major depressive disorder, single episode, unspecified: Secondary | ICD-10-CM | POA: Insufficient documentation

## 2020-08-08 NOTE — Assessment & Plan Note (Addendum)
Starting wellbutrin for management of negative symptoms (anhedonia, fatigue) .  Continue cymbalta. RTC one moth

## 2020-08-08 NOTE — Assessment & Plan Note (Addendum)
a1c unchanged and still above goal .  She is now taking levemir 15 units,  And metformin.  Asked to check post prandials and return in one month for A1c. continue  Losartan , atorvastatin and gabapentin for neuropathy.    Lab Results  Component Value Date   HGBA1C 7.5 (H) 06/13/2020   Lab Results  Component Value Date   MICROALBUR 1.8 02/20/2020   MICROALBUR 0.7 04/14/2019

## 2020-08-08 NOTE — Assessment & Plan Note (Signed)
Previously managed with 0.5 mg alprazolam.  Has been out of medication x 1 week.  Will initiate trial of restoril to reduce alprazolam dependence.

## 2020-08-08 NOTE — Telephone Encounter (Signed)
Prior Authorization for Temazepam has been approved. [facility] has been made aware and is going to pick up medication this evening.

## 2020-08-26 ENCOUNTER — Other Ambulatory Visit: Payer: Self-pay | Admitting: Internal Medicine

## 2020-08-29 ENCOUNTER — Other Ambulatory Visit: Payer: Self-pay | Admitting: Internal Medicine

## 2020-09-04 ENCOUNTER — Ambulatory Visit (INDEPENDENT_AMBULATORY_CARE_PROVIDER_SITE_OTHER): Payer: Medicare HMO

## 2020-09-04 ENCOUNTER — Other Ambulatory Visit: Payer: Self-pay | Admitting: Internal Medicine

## 2020-09-04 VITALS — Ht 64.02 in | Wt 163.0 lb

## 2020-09-04 DIAGNOSIS — Z1231 Encounter for screening mammogram for malignant neoplasm of breast: Secondary | ICD-10-CM

## 2020-09-04 DIAGNOSIS — Z0000 Encounter for general adult medical examination without abnormal findings: Secondary | ICD-10-CM | POA: Diagnosis not present

## 2020-09-04 NOTE — Addendum Note (Signed)
Addended by: Dia Crawford on: 09/04/2020 10:14 AM   Modules accepted: Orders, SmartSet

## 2020-09-04 NOTE — Progress Notes (Signed)
Subjective:   Maria Hinton is a 75 y.o. female who presents for Medicare Annual (Subsequent) preventive examination.  Review of Systems    No ROS.  Medicare Wellness Virtual Visit.   Cardiac Risk Factors include: advanced age (>57men, >29 women);diabetes mellitus;hypertension     Objective:    Today's Vitals   09/04/20 0936  Weight: 163 lb (73.9 kg)  Height: 5' 4.02" (1.626 m)   Body mass index is 27.96 kg/m.  Advanced Directives 09/04/2020 05/10/2020 03/07/2020 11/21/2019 09/04/2019 02/01/2019 01/04/2019  Does Patient Have a Medical Advance Directive? Yes Yes Yes Yes Yes Yes Yes  Type of Paramedic of [place];Living will Living will [place];Living will [place];Living will [place];Living will Living will;Healthcare Power of [place];Living will  Does patient want to make changes to medical advance directive? No - Patient declined - - - No - Patient declined No - Patient declined No - Patient declined  Copy of [place] in Chart? No - copy requested - - - No - copy requested No - copy requested No - copy requested  Pre-existing out of facility DNR order (yellow form or pink MOST form) - - - - - - -    Current Medications (verified) Outpatient Encounter Medications as of 09/04/2020  Medication Sig  . albuterol (PROVENTIL HFA;VENTOLIN HFA) 108 (90 Base) MCG/ACT inhaler Inhale 2 puffs into the lungs every 6 (six) hours as needed for wheezing.  Marland Kitchen aspirin EC 81 MG tablet Take 81 mg by mouth daily.  Marland Kitchen atenolol (TENORMIN) 50 MG tablet Take 1 tablet (50 mg total) by mouth 2 (two) times daily.  Marland Kitchen atorvastatin (LIPITOR) 40 MG tablet TAKE 1 TABLET BY MOUTH DAILY AT 6 PM.  . BD PEN NEEDLE NANO U/F 32G X 4 MM MISC USE DAILY AS DIRECTED WITH LEVEMIR  . gabapentin (NEURONTIN) 100 MG capsule TAKE 2 CAPSULES (200 MG TOTAL) BY MOUTH 3 (THREE) TIMES DAILY.  Marland Kitchen LEVEMIR  FLEXTOUCH 100 UNIT/ML FlexPen INJECT 15 UNITS INTO THE SKIN DAILY AT 10 PM.  . levothyroxine (SYNTHROID) 112 MCG tablet TAKE 1 TABLET BY MOUTH EVERY DAY  . losartan (COZAAR) 100 MG tablet Take 1 tablet (100 mg total) by mouth daily.  . metFORMIN (GLUCOPHAGE) 1000 MG tablet TAKE 1 TABLET BY MOUTH 2 TIMES DAILY WITH A MEAL.  Marland Kitchen omeprazole (PRILOSEC) 40 MG capsule TAKE 1 CAPSULE BY MOUTH EVERY DAY  . OneTouch Delica Lancets 11X MISC 1 application by Does not apply route 2 (two) times daily. Use to check blood sugar up to two times daily. E11.69  . ONETOUCH VERIO test strip USE AS INSTRUCTED  . temazepam (RESTORIL) 15 MG capsule Take 1 capsule (15 mg total) by mouth at bedtime as needed for sleep.  . traMADol (ULTRAM) 50 MG tablet TAKE 1 TABLET BY MOUTH EVERY 6 HOURS AS NEEDED FOR SPINAL STENOSIS PAIN  . buPROPion (WELLBUTRIN XL) 150 MG 24 hr tablet TAKE 1 TABLET BY MOUTH EVERY DAY (Patient not taking: Reported on 09/04/2020)   No facility-administered encounter medications on file as of 09/04/2020.    Allergies (verified) Vicodin [hydrocodone-acetaminophen] and Vicodin hp [hydrocodone-acetaminophen]   History: Past Medical History:  Diagnosis Date  . Actinic keratoses    follows with dermatology   . Arthritis   . Asthma   . Chronic back pain    DDD,herniated disc/spondylosis/radiculopathy  . COPD (chronic obstructive pulmonary disease) ([place])   . Diabetes  mellitus    1980s  . Early cataracts, bilateral    Dr. Ruthine Dose - [facility]  . GERD (gastroesophageal reflux disease)    takes Omeprazole daily  . Hemorrhoids   . History of colonic polyps    colonoscopy 04/2012 - Dr Candace Cruise  . Hyperlipidemia    takes Pravastatin daily  . Hypertension    takes Metoprolol/HCTZ daily  . Hyperthyroidism   . Insomnia   . Joint pain    fingers  . Joint swelling   . Pneumonia    x 2 ;in Dec 2010/2011  . PONV (postoperative nausea and vomiting)   . Right leg weakness    Past Surgical  History:  Procedure Laterality Date  . ABDOMINAL HYSTERECTOMY     at age 54  . BACK SURGERY  20+yrs ago  . CATARACT EXTRACTION W/PHACO Left 01/04/2019   Procedure: CATARACT EXTRACTION PHACO AND INTRAOCULAR LENS PLACEMENT ([place])  LEFT;  Surgeon: Leandrew Koyanagi, MD;  Location: [facility];  Service: Ophthalmology;  Laterality: Left;  GIVE IV ZOFRAN  . CATARACT EXTRACTION W/PHACO Right 02/01/2019   Procedure: CATARACT EXTRACTION PHACO AND INTRAOCULAR LENS PLACEMENT ([place])  RIGHT DIABETIC;  Surgeon: Leandrew Koyanagi, MD;  Location: [facility];  Service: Ophthalmology;  Laterality: Right;  DIABETIC GIVE IV ZOFRAN  . COLONOSCOPY    . COLONOSCOPY N/A 05/13/2015   Procedure: COLONOSCOPY;  Surgeon: Hulen Luster, MD;  Location: [facility] ENDOSCOPY;  Service: Gastroenterology;  Laterality: N/A;  . COLONOSCOPY WITH PROPOFOL N/A 05/10/2020   Procedure: COLONOSCOPY WITH PROPOFOL;  Surgeon: Virgel Manifold, MD;  Location: [facility] ENDOSCOPY;  Service: Endoscopy;  Laterality: N/A;  . ESOPHAGOGASTRODUODENOSCOPY  2011  . ESOPHAGOGASTRODUODENOSCOPY (EGD) WITH PROPOFOL N/A 09/02/2017   Procedure: ESOPHAGOGASTRODUODENOSCOPY (EGD) WITH PROPOFOL;  Surgeon: Virgel Manifold, MD;  Location: [facility] ENDOSCOPY;  Service: Endoscopy;  Laterality: N/A;  . FOOT SURGERY  2011   bunionectomy and knot removed from bottom of both feet  . JOINT REPLACEMENT Left 2014   hip  Kraskinski  . LUMBAR LAMINECTOMY/DECOMPRESSION MICRODISCECTOMY  09/10/2011   Procedure: LUMBAR LAMINECTOMY/DECOMPRESSION MICRODISCECTOMY 1 LEVEL;  Surgeon: Hosie Spangle, MD;  Location: [place] [facility] NEURO ORS;  Service: Neurosurgery;  Laterality: Right;  RIGHT Lumbar  Laminotomy and microdiskectomy Lumbar Four-Five  . LUMBAR LAMINECTOMY/DECOMPRESSION MICRODISCECTOMY N/A 06/26/2015   Procedure: LUMBAR LAMINECTOMY/DECOMPRESSION MICRODISCECTOMY 1 LEVEL;  Surgeon: Jovita Gamma, MD;  Location: [facility] NEURO ORS;  Service: Neurosurgery;  Laterality: N/A;  L3  and L4 Laminectomies  . MELANOMA EXCISION  2011   on nose/back on neck  . THORACIC DISC SURGERY  at age 58 and 46    mass of veins that had gotten into muscle and wrapped around rib;3 ribs also removed from left side  . TONSILLECTOMY  as a child   and adenoids   . TOTAL HIP ARTHROPLASTY Left 2014   Dr. Mack Guise  . TUBAL LIGATION    . VAGINA SURGERY     vaginal wall ruptured    Family History  Problem Relation Age of Onset  . Heart disease Brother        valve replacement  . COPD Brother   . COPD Mother        Emphysema  . Diabetes Mother   . Other Mother        Died in house fire at age 75.  Marland Kitchen Heart disease Father   . Seizures Father   . Cancer Father        Lung cancer with brain metastasis  .  Asthma Brother   . COPD Son 76  . Cancer Other        aunt ? maternal vs paternal side lung cancer   . Diabetes Maternal Grandmother   . Anesthesia problems Neg Hx   . Hypotension Neg Hx   . Malignant hyperthermia Neg Hx   . Pseudochol deficiency Neg Hx   . Thyroid disease Neg Hx   . Breast cancer Neg Hx    Social History   Socioeconomic History  . Marital status: Widowed    Spouse name: Not on file  . Number of children: 2  . Years of education: 64  . Highest education level: High school graduate  Occupational History  . Occupation: Retired  Tobacco Use  . Smoking status: Former Smoker    Packs/day: 0.25    Years: 20.00    Pack years: 5.00    Types: Cigarettes    Quit date: 2018    Years since quitting: 4.1  . Smokeless tobacco: Never Used  . Tobacco comment: Pt quit 11/08/2014.Pt had quit smoking for 13 years before starting back in 2010; per 05/2017 visit quit smoking 2017 smoked total 25 years 1 ppd   Vaping Use  . Vaping Use: Never used  Substance and Sexual Activity  . Alcohol use: No    Alcohol/week: 0.0 standard drinks  . Drug use: No  . Sexual activity: Never    Birth control/protection: Surgical  Other Topics Concern  . Not on file  Social History  Narrative   Widowed.    Lives alone.   Right-handed.    2 cups caffeine per day.   Social Determinants of Health   Financial Resource Strain: Low Risk   . Difficulty of Paying Living Expenses: Not hard at all  Food Insecurity: No Food Insecurity  . Worried About Charity fundraiser in the Last Year: Never true  . Ran Out of Food in the Last Year: Never true  Transportation Needs: No Transportation Needs  . Lack of Transportation (Medical): No  . Lack of Transportation (Non-Medical): No  Physical Activity: Unknown  . Days of Exercise per Week: 0 days  . Minutes of Exercise per Session: Not on file  Stress: No Stress Concern Present  . Feeling of Stress : Not at all  Social Connections: Unknown  . Frequency of Communication with Friends and Family: More than three times a week  . Frequency of Social Gatherings with Friends and Family: Once a week  . Attends Religious Services: Not on file  . Active Member of Clubs or Organizations: Yes  . Attends Archivist Meetings: Not on file  . Marital Status: Widowed    Tobacco Counseling Counseling given: Not Answered Comment: Pt quit 11/08/2014.Pt had quit smoking for 13 years before starting back in 2010; per 05/2017 visit quit smoking 2017 smoked total 25 years 1 ppd    Clinical Intake:  Pre-visit preparation completed: Yes    Nutrition Risk Assessment: Has the patient had any N/V/D within the last 2 months?  No  Does the patient have any non-healing wounds?  No  Has the patient had any unintentional weight loss or weight gain?  No   Diabetes: If diabetic, was a CBG obtained today?  No  Did the patient bring in their glucometer from home?  No   Financial Strains and Diabetes Management: Are you having any financial strains with the device, your supplies or your medication? No .  Does the patient want to be  seen by Chronic Care Management for management of their diabetes?  No  Would the patient like to be referred to  a Nutritionist or for Diabetic Management?  No   Diabetes: Yes (Followed by pcp)  How often do you need to have someone help you when you read instructions, pamphlets, or other written materials from your doctor or pharmacy?: 1 - Never   Interpreter Needed?: No      Activities of Daily Living In your present state of health, do you have any difficulty performing the following activities: 09/04/2020  Hearing? N  Vision? N  Difficulty concentrating or making decisions? N  Walking or climbing stairs? N  Dressing or bathing? N  Doing errands, shopping? N  Preparing Food and eating ? N  Using the Toilet? N  In the past six months, have you accidently leaked urine? N  Do you have problems with loss of bowel control? N  Managing your Medications? N  Managing your Finances? N  Housekeeping or managing your Housekeeping? N  Some recent data might be hidden    Patient Care Team: Crecencio Mc, MD as PCP - General (Internal Medicine) Jovita Gamma, MD as Consulting Physician (Neurosurgery)  Indicate any recent Medical Services you may have received from other than [facility] providers in the past year (date may be approximate).     Assessment:   This is a routine wellness examination for Percy.  I connected with Nattie today by telephone and verified that I am speaking with the correct person using two identifiers. Location patient: home Location provider: work Persons participating in the virtual visit: patient, Marine scientist.    I discussed the limitations, risks, security and privacy concerns of performing an evaluation and management service by telephone and the availability of in person appointments. The patient expressed understanding and verbally consented to this telephonic visit.    Interactive audio and video telecommunications were attempted between this provider and patient, however failed, due to patient having technical difficulties OR patient did not have access to video  capability.  We continued and completed visit with audio only.  Some vital signs may be absent or patient reported.   Hearing/Vision screen  Hearing Screening   125Hz  250Hz  500Hz  1000Hz  2000Hz  3000Hz  4000Hz  6000Hz  8000Hz   Right ear:           Left ear:           Comments: Patient is able to hear conversational tones without difficulty.  No issues reported.   Dietary issues and exercise activities discussed: Current Exercise Habits: Home exercise routine, Intensity: Mild  Healthy diet Good water intake  Goals      Patient Stated   .  DIET - INCREASE WATER INTAKE (pt-stated)      Stay hydrated    .  Increase physical activity (pt-stated)      Walk more for exercise Decrease sugar intake    .  Weight (lb) < 150 lb (68 kg) (pt-stated)      Depression Screen PHQ 2/9 Scores 09/04/2020 09/06/2019 09/04/2019 09/02/2018 04/11/2018 04/05/2017 01/30/2016  PHQ - 2 Score 0 3 1 0 0 3 0  PHQ- 9 Score - 9 4 - 4 14 -    Fall Risk Fall Risk  09/04/2020 08/07/2020 03/07/2020 02/20/2020 10/10/2019  Falls in the past year? 1 1 1 1 1   Number falls in past yr: - 1 1 1 1   Comment - - - - -  Injury with Fall? - 0 0 0 0  Comment - - - - -  Risk for fall due to : - - - - -  Follow up Falls evaluation completed Falls evaluation completed - Falls evaluation completed Falls evaluation completed    [place]: Handrails in use when climbing stairs? Yes Home free of loose throw rugs in walkways, pet beds, electrical cords, etc? Yes  Adequate lighting in your home to reduce risk of falls? Yes   ASSISTIVE DEVICES UTILIZED TO PREVENT FALLS: Life alert? No  Use of a cane, walker or w/c? No   TIMED UP AND GO: Was the test performed? No .  Virtual visit.   Cognitive Function: Patient is alert and oriented x3.  Denies difficulty focusing, making decisions, memory loss.  Enjoys reading for brain health.  MMSE/6CIT deferred. Normal by direct communication/observation.  MMSE - Mini  Mental State Exam 01/30/2016  Orientation to time 5  Orientation to Place 5  Registration 3  Attention/ Calculation 5  Recall 3  Language- name 2 objects 2  Language- repeat 1  Language- follow 3 step command 3  Language- read & follow direction 1  Write a sentence 1  Copy design 1  Total score 30     6CIT Screen 09/04/2019 09/02/2018  What Year? 0 points 0 points  What month? 0 points 0 points  What time? 0 points 0 points  Count back from 20 0 points 0 points  Months in reverse 0 points 0 points  Repeat phrase 0 points 0 points  Total Score 0 0    Immunizations Immunization History  Administered Date(s) Administered  . Fluad Quad(high Dose 65+) 04/22/2020  . Influenza Split 09/11/2011  . Influenza, High Dose Seasonal PF 06/03/2016, 04/05/2017, 04/11/2018  . Influenza,inj,Quad PF,6+ Mos 04/27/2013, 09/17/2014, 04/12/2015  . Influenza-Unspecified 04/26/2019  . PFIZER(Purple Top)SARS-COV-2 Vaccination 09/01/2019, 09/26/2019, 06/04/2020  . Pneumococcal Conjugate-13 02/09/2014  . Pneumococcal Polysaccharide-23 09/11/2011, 02/25/2018  . Tdap 02/09/2014    Health Maintenance Health Maintenance  Topic Date Due  . MAMMOGRAM  08/21/2020  . HEMOGLOBIN A1C  12/12/2020  . OPHTHALMOLOGY EXAM  12/21/2020  . FOOT EXAM  08/07/2021  . COLONOSCOPY (Pts 45-30yrs Insurance coverage will need to be confirmed)  05/11/2023  . TETANUS/TDAP  02/10/2024  . INFLUENZA VACCINE  Completed  . DEXA SCAN  Completed  . COVID-19 Vaccine  Completed  . Hepatitis C Screening  Completed  . PNA vac Low Risk Adult  Completed  . HPV VACCINES  Aged Out   Colorectal cancer screening: Type of screening: Colonoscopy. Completed 05/10/20. Repeat every 3 years  Mammogram status: Completed 08/22/19. Repeat every year. Ordered.   Lung Cancer Screening: (Low Dose CT Chest recommended if Age 27-80 years, 30 pack-year currently smoking OR have quit w/in 15years.) does not qualify.   Vision Screening: Recommended  annual ophthalmology exams for early detection of glaucoma and other disorders of the eye. Is the patient up to date with their annual eye exam?  Yes  Who is the provider or what is the name of the office in which the patient attends annual eye exams? [facility]  Dental Screening: Recommended annual dental exams for proper oral hygiene. Visits every 6 months.   Community Resource Referral / Chronic Care Management: CRR required this visit?  No   CCM required this visit?  No      Plan:   Keep all routine maintenance appointments.   Next scheduled lab 09/12/20 and 10/10/20.  Follow up 10/14/20 @ 1:30.  I have personally reviewed and noted the following in the patient's chart:   . Medical and social history . Use of alcohol, tobacco or illicit drugs  . Current medications and supplements . Functional ability and status . Nutritional status . Physical activity . Advanced directives . List of other physicians . Hospitalizations, surgeries, and ER visits in previous 12 months . Vitals . Screenings to include cognitive, depression, and falls . Referrals and appointments  In addition, I have reviewed and discussed with patient certain preventive protocols, quality metrics, and best practice recommendations. A written personalized care plan for preventive services as well as general preventive health recommendations were provided to patient via mychart.     Varney Biles, LPN   12/04/4429

## 2020-09-04 NOTE — Patient Instructions (Addendum)
Maria Hinton , Thank you for taking time to come for your Medicare Wellness Visit. I appreciate your ongoing commitment to your health goals. Please review the following plan we discussed and let me know if I can assist you in the future.   These are the goals we discussed: Goals      Patient Stated   .  DIET - INCREASE WATER INTAKE (pt-stated)      Stay hydrated    .  Increase physical activity (pt-stated)      Walk more for exercise Decrease sugar intake    .  Weight (lb) < 150 lb (68 kg) (pt-stated)       This is a list of the screening recommended for you and due dates:  Health Maintenance  Topic Date Due  . Mammogram  08/21/2020  . Hemoglobin A1C  12/12/2020  . Eye exam for diabetics  12/21/2020  . Complete foot exam   08/07/2021  . Colon Cancer Screening  05/11/2023  . Tetanus Vaccine  02/10/2024  . Flu Shot  Completed  . DEXA scan (bone density measurement)  Completed  . COVID-19 Vaccine  Completed  .  Hepatitis C: One time screening is recommended by Center for Disease Control  (CDC) for  adults born from [birth year] through 1963.   Completed  . Pneumonia vaccines  Completed  . HPV Vaccine  Aged Out   Keep all routine maintenance appointments.   Next scheduled lab 09/12/20 and 10/10/20.  Follow up 10/14/20 @ 1:30.  Advanced directives: End of life planning; Advance aging; Advanced directives discussed.  Copy of current HCPOA/Living Will requested.    Conditions/risks identified: none new  Follow up in one year for your annual wellness visit.   Preventive Care 57 Years and Older, Female Preventive care refers to lifestyle choices and visits with your health care provider that can promote health and wellness. What does preventive care include?  A yearly physical exam. This is also called an annual well check.  Dental exams once or twice a year.  Routine eye exams. Ask your health care provider how often you should have your eyes checked.  Personal lifestyle  choices, including:  Daily care of your teeth and gums.  Regular physical activity.  Eating a healthy diet.  Avoiding tobacco and drug use.  Limiting alcohol use.  Practicing safe sex.  Taking low-dose aspirin every day.  Taking vitamin and mineral supplements as recommended by your health care provider. What happens during an annual well check? The services and screenings done by your health care provider during your annual well check will depend on your age, overall health, lifestyle risk factors, and family history of disease. Counseling  Your health care provider may ask you questions about your:  Alcohol use.  Tobacco use.  Drug use.  Emotional well-being.  Home and relationship well-being.  Sexual activity.  Eating habits.  History of falls.  Memory and ability to understand (cognition).  Work and work Statistician.  Reproductive health. Screening  You may have the following tests or measurements:  Height, weight, and BMI.  Blood pressure.  Lipid and cholesterol levels. These may be checked every 5 years, or more frequently if you are over 73 years old.  Skin check.  Lung cancer screening. You may have this screening every year starting at age 73 if you have a 30-pack-year history of smoking and currently smoke or have quit within the past 15 years.  Fecal occult blood test (FOBT) of the stool.  You may have this test every year starting at age 73.  Flexible sigmoidoscopy or colonoscopy. You may have a sigmoidoscopy every 5 years or a colonoscopy every 10 years starting at age 73.  Hepatitis C blood test.  Hepatitis B blood test.  Sexually transmitted disease (STD) testing.  Diabetes screening. This is done by checking your blood sugar (glucose) after you have not eaten for a while (fasting). You may have this done every 1-3 years.  Bone density scan. This is done to screen for osteoporosis. You may have this done starting at age  73.  Mammogram. This may be done every 1-2 years. Talk to your health care provider about how often you should have regular mammograms. Talk with your health care provider about your test results, treatment options, and if necessary, the need for more tests. Vaccines  Your health care provider may recommend certain vaccines, such as:  Influenza vaccine. This is recommended every year.  Tetanus, diphtheria, and acellular pertussis (Tdap, Td) vaccine. You may need a Td booster every 10 years.  Zoster vaccine. You may need this after age 61.  Pneumococcal 13-valent conjugate (PCV13) vaccine. One dose is recommended after age 73.  Pneumococcal polysaccharide (PPSV23) vaccine. One dose is recommended after age 73. Talk to your health care provider about which screenings and vaccines you need and how often you need them. This information is not intended to replace advice given to you by your health care provider. Make sure you discuss any questions you have with your health care provider. Document Released: 07/19/2015 Document Revised: 03/11/2016 Document Reviewed: 04/23/2015 Elsevier Interactive Patient Education  2017 [place] Prevention in the Home Falls can cause injuries. They can happen to people of all ages. There are many things you can do to make your home safe and to help prevent falls. What can I do on the outside of my home?  Regularly fix the edges of walkways and driveways and fix any cracks.  Remove anything that might make you trip as you walk through a door, such as a raised step or threshold.  Trim any bushes or trees on the path to your home.  Use bright outdoor lighting.  Clear any walking paths of anything that might make someone trip, such as rocks or tools.  Regularly check to see if handrails are loose or broken. Make sure that both sides of any steps have handrails.  Any raised decks and porches should have guardrails on the edges.  Have any  leaves, snow, or ice cleared regularly.  Use sand or salt on walking paths during winter.  Clean up any spills in your garage right away. This includes oil or grease spills. What can I do in the bathroom?  Use night lights.  Install grab bars by the toilet and in the tub and shower. Do not use towel bars as grab bars.  Use non-skid mats or decals in the tub or shower.  If you need to sit down in the shower, use a plastic, non-slip stool.  Keep the floor dry. Clean up any water that spills on the floor as soon as it happens.  Remove soap buildup in the tub or shower regularly.  Attach bath mats securely with double-sided non-slip rug tape.  Do not have throw rugs and other things on the floor that can make you trip. What can I do in the bedroom?  Use night lights.  Make sure that you have a light by your bed that  is easy to reach.  Do not use any sheets or blankets that are too big for your bed. They should not hang down onto the floor.  Have a firm chair that has side arms. You can use this for support while you get dressed.  Do not have throw rugs and other things on the floor that can make you trip. What can I do in the kitchen?  Clean up any spills right away.  Avoid walking on wet floors.  Keep items that you use a lot in easy-to-reach places.  If you need to reach something above you, use a strong step stool that has a grab bar.  Keep electrical cords out of the way.  Do not use floor polish or wax that makes floors slippery. If you must use wax, use non-skid floor wax.  Do not have throw rugs and other things on the floor that can make you trip. What can I do with my stairs?  Do not leave any items on the stairs.  Make sure that there are handrails on both sides of the stairs and use them. Fix handrails that are broken or loose. Make sure that handrails are as long as the stairways.  Check any carpeting to make sure that it is firmly attached to the stairs.  Fix any carpet that is loose or worn.  Avoid having throw rugs at the top or bottom of the stairs. If you do have throw rugs, attach them to the floor with carpet tape.  Make sure that you have a light switch at the top of the stairs and the bottom of the stairs. If you do not have them, ask someone to add them for you. What else can I do to help prevent falls?  Wear shoes that:  Do not have high heels.  Have rubber bottoms.  Are comfortable and fit you well.  Are closed at the toe. Do not wear sandals.  If you use a stepladder:  Make sure that it is fully opened. Do not climb a closed stepladder.  Make sure that both sides of the stepladder are locked into place.  Ask someone to hold it for you, if possible.  Clearly mark and make sure that you can see:  Any grab bars or handrails.  First and last steps.  Where the edge of each step is.  Use tools that help you move around (mobility aids) if they are needed. These include:  Canes.  Walkers.  Scooters.  Crutches.  Turn on the lights when you go into a dark area. Replace any light bulbs as soon as they burn out.  Set up your furniture so you have a clear path. Avoid moving your furniture around.  If any of your floors are uneven, fix them.  If there are any pets around you, be aware of where they are.  Review your medicines with your doctor. Some medicines can make you feel dizzy. This can increase your chance of falling. Ask your doctor what other things that you can do to help prevent falls. This information is not intended to replace advice given to you by your health care provider. Make sure you discuss any questions you have with your health care provider. Document Released: 04/18/2009 Document Revised: 11/28/2015 Document Reviewed: 07/27/2014 Elsevier Interactive Patient Education  2017 [employer].

## 2020-09-12 ENCOUNTER — Other Ambulatory Visit: Payer: Self-pay

## 2020-09-12 ENCOUNTER — Other Ambulatory Visit (INDEPENDENT_AMBULATORY_CARE_PROVIDER_SITE_OTHER): Payer: Medicare HMO

## 2020-09-12 DIAGNOSIS — E1110 Type 2 diabetes mellitus with ketoacidosis without coma: Secondary | ICD-10-CM

## 2020-09-12 LAB — LIPID PANEL
Cholesterol: 149 mg/dL (ref 0–200)
HDL: 52.5 mg/dL (ref >39.00)
LDL Cholesterol: 67 mg/dL (ref 0–99)
NonHDL: 96.81
Total CHOL/HDL Ratio: 3
Triglycerides: 151 mg/dL — ABNORMAL HIGH (ref 0.0–149.0)
VLDL: 30.2 mg/dL (ref 0.0–40.0)

## 2020-09-12 LAB — COMPREHENSIVE METABOLIC PANEL
ALT: 11 U/L (ref 0–35)
AST: 13 U/L (ref 0–37)
Albumin: 4.3 g/dL (ref 3.5–5.2)
Alkaline Phosphatase: 108 U/L (ref 39–117)
BUN: 12 mg/dL (ref 6–23)
CO2: 30 mEq/L (ref 19–32)
Calcium: 8.9 mg/dL (ref 8.4–10.5)
Chloride: 99 mEq/L (ref 96–112)
Creatinine, Ser: 0.9 mg/dL (ref 0.40–1.20)
GFR: 63.84 mL/min (ref >60.00)
Glucose, Bld: 138 mg/dL — ABNORMAL HIGH (ref 70–99)
Potassium: 4.7 mEq/L (ref 3.5–5.1)
Sodium: 137 mEq/L (ref 135–145)
Total Bilirubin: 0.5 mg/dL (ref 0.2–1.2)
Total Protein: 7 g/dL (ref 6.0–8.3)

## 2020-09-12 LAB — HEMOGLOBIN A1C: Hgb A1c MFr Bld: 7.8 % — ABNORMAL HIGH (ref 4.6–6.5)

## 2020-09-14 ENCOUNTER — Other Ambulatory Visit: Payer: Self-pay | Admitting: Internal Medicine

## 2020-09-14 MED ORDER — LEVEMIR FLEXTOUCH 100 UNIT/ML ~~LOC~~ SOPN: 18.0000 [IU] | PEN_INJECTOR | Freq: Every day | SUBCUTANEOUS | 0 refills | Status: DC

## 2020-09-23 ENCOUNTER — Ambulatory Visit
Admission: RE | Admit: 2020-09-23 | Discharge: 2020-09-23 | Disposition: A | Discharge status: Home / Self Care | Payer: Medicare HMO | Source: Ambulatory Visit | Attending: Internal Medicine | Admitting: Internal Medicine

## 2020-09-23 ENCOUNTER — Other Ambulatory Visit: Payer: Self-pay

## 2020-09-23 DIAGNOSIS — Z1231 Encounter for screening mammogram for malignant neoplasm of breast: Secondary | ICD-10-CM

## 2020-10-08 ENCOUNTER — Telehealth: Payer: Self-pay | Admitting: *Deleted

## 2020-10-08 NOTE — Telephone Encounter (Signed)
Please place future orders for lab appt.  

## 2020-10-09 NOTE — Telephone Encounter (Signed)
No labs needed.  Just had them in march

## 2020-10-10 ENCOUNTER — Other Ambulatory Visit: Payer: Self-pay

## 2020-10-10 ENCOUNTER — Other Ambulatory Visit (INDEPENDENT_AMBULATORY_CARE_PROVIDER_SITE_OTHER): Payer: Medicare HMO

## 2020-10-10 DIAGNOSIS — E890 Postprocedural hypothyroidism: Secondary | ICD-10-CM

## 2020-10-10 LAB — TSH: TSH: 2.13 u[IU]/mL (ref 0.35–4.50)

## 2020-10-10 LAB — T4, FREE: Free T4: 1.23 ng/dL (ref 0.60–1.60)

## 2020-10-14 ENCOUNTER — Ambulatory Visit: Payer: Medicare HMO | Admitting: Internal Medicine

## 2020-10-15 ENCOUNTER — Ambulatory Visit: Payer: Medicare HMO | Admitting: Endocrinology

## 2020-10-16 ENCOUNTER — Telehealth: Payer: Medicare HMO | Admitting: Internal Medicine

## 2020-11-04 ENCOUNTER — Encounter: Payer: Self-pay | Admitting: Endocrinology

## 2020-11-04 ENCOUNTER — Other Ambulatory Visit: Payer: Self-pay

## 2020-11-04 ENCOUNTER — Telehealth (INDEPENDENT_AMBULATORY_CARE_PROVIDER_SITE_OTHER): Payer: Medicare HMO | Admitting: Endocrinology

## 2020-11-04 DIAGNOSIS — E890 Postprocedural hypothyroidism: Secondary | ICD-10-CM

## 2020-11-04 NOTE — Progress Notes (Signed)
Patient ID: Maria Hinton, female   DOB: Mar 04, 1946, 76 y.o.   MRN: [id number]                                                                                                             Today's office visit was provided via telemedicine using a telephone call to the patient Patient has been explained the limitations of evaluation and management by telemedicine and the availability of in person appointments.  The patient understood the limitations and agreed to proceed. Patient also understood that the telehealth visit is billable. . Location of the patient: Home . Location of the provider: Office Only the patient and myself were participating in the encounter   Reason for Appointment:  follow-up visit     History of Present Illness:   Prior history of hyperthyroidism:  For several months before her consultation she had symptoms of weakness, palpitations, shakiness, feeling sweaty in am, nervousness, and fatigue.  Also would feel a strong heartbeat in her ears She had been on atenolol for hypertension also She had a decreased appetite and started losing weight late last summer The patient had lost about  26 lbs since these symptoms started   She started taking methimazole in 09/2017 for her hyperthyroidism On her 10/2017 visit she was taking 10 mg twice daily because of hyperthyroidism she was given 30 mg daily In late 2019 she appeared to be needing progressively higher doses of methimazole  On her visit in 12/19 she was having fatigue and weakness; did not complain of feeling shaky or having palpitations, no weight loss However her thyroid levels were significantly high with 7.5 mg of methimazole and the dose was further adjusted  She again had a relapse in 5/21 without methimazole  RECENT history:  She had I-131 treatment on 01/02/2020 with 25 mCi; the dose was prescribed by the radiologist This was done because of recurrent or persistent hyperthyroidism In 10/21 she was  significantly hypothyroid with free T4 of 0.42  She is continued to be taking Synthroid 112 mcg She takes regularly every morning before breakfast, taking this with a full glass of water 60 minutes before eating She has not missed any doses No complaints of recent fatigue, cold intolerance She appears to have had some gradual weight gain  TSH is consistently normal now  Wt Readings from Last 3 Encounters:  09/04/20 163 lb (73.9 kg)  08/07/20 161 lb 9.6 oz (73.3 kg)  05/10/20 159 lb (72.1 kg)     Thyrotropin receptor antibody slightly above normal as of 4/21   Thyroid function tests as follows:     Lab Results  Component Value Date   TSH 2.13 10/10/2020   TSH 1.10 06/13/2020   TSH 33.14 (H) 04/18/2020   FREET4 1.23 10/10/2020   FREET4 0.87 06/13/2020   FREET4 0.42 (L) 04/18/2020     Lab Results  Component Value Date   THYROTRECAB 2.08 (H) 10/11/2019   THYROTRECAB 1.66 07/13/2019   THYROTRECAB 1.79 (H) 03/30/2019     Allergies as of  11/04/2020      Reactions   Vicodin [hydrocodone-acetaminophen] Other (See Comments)   Insomnia    Vicodin Hp [hydrocodone-acetaminophen]       Medication List       Accurate as of Nov 04, 2020  8:12 AM. If you have any questions, ask your nurse or doctor.        albuterol 108 (90 Base) MCG/ACT inhaler Commonly known as: VENTOLIN HFA Inhale 2 puffs into the lungs every 6 (six) hours as needed for wheezing.   aspirin EC 81 MG tablet Take 81 mg by mouth daily.   atenolol 50 MG tablet Commonly known as: TENORMIN Take 1 tablet (50 mg total) by mouth 2 (two) times daily.   atorvastatin 40 MG tablet Commonly known as: LIPITOR TAKE 1 TABLET BY MOUTH DAILY AT 6 PM.   BD Pen Needle Nano U/F 32G X 4 MM Misc Generic drug: Insulin Pen Needle USE DAILY AS DIRECTED WITH LEVEMIR   buPROPion 150 MG 24 hr tablet Commonly known as: WELLBUTRIN XL TAKE 1 TABLET BY MOUTH EVERY DAY   gabapentin 100 MG capsule Commonly known as:  NEURONTIN TAKE 2 CAPSULES (200 MG TOTAL) BY MOUTH 3 (THREE) TIMES DAILY.   Levemir FlexTouch 100 UNIT/ML FlexPen Generic drug: insulin detemir Inject 18 Units into the skin at bedtime.   levothyroxine 112 MCG tablet Commonly known as: SYNTHROID TAKE 1 TABLET BY MOUTH EVERY DAY   losartan 100 MG tablet Commonly known as: COZAAR Take 1 tablet (100 mg total) by mouth daily.   metFORMIN 1000 MG tablet Commonly known as: GLUCOPHAGE TAKE 1 TABLET BY MOUTH 2 TIMES DAILY WITH A MEAL.   omeprazole 40 MG capsule Commonly known as: PRILOSEC TAKE 1 CAPSULE BY MOUTH EVERY DAY   OneTouch Delica Lancets 19J Misc 1 application by Does not apply route 2 (two) times daily. Use to check blood sugar up to two times daily. E11.69   OneTouch Verio test strip Generic drug: glucose blood USE AS INSTRUCTED   temazepam 15 MG capsule Commonly known as: RESTORIL Take 1 capsule (15 mg total) by mouth at bedtime as needed for sleep.   traMADol 50 MG tablet Commonly known as: ULTRAM TAKE 1 TABLET BY MOUTH EVERY 6 HOURS AS NEEDED FOR SPINAL STENOSIS PAIN           Past Medical History:  Diagnosis Date  . Actinic keratoses    follows with dermatology   . Arthritis   . Asthma   . Chronic back pain    DDD,herniated disc/spondylosis/radiculopathy  . COPD (chronic obstructive pulmonary disease) ([place])   . Diabetes mellitus    1980s  . Early cataracts, bilateral    Dr. Ruthine Dose - [facility]  . GERD (gastroesophageal reflux disease)    takes Omeprazole daily  . Hemorrhoids   . History of colonic polyps    colonoscopy 04/2012 - Dr Candace Cruise  . Hyperlipidemia    takes Pravastatin daily  . Hypertension    takes Metoprolol/HCTZ daily  . Hyperthyroidism   . Insomnia   . Joint pain    fingers  . Joint swelling   . Pneumonia    x 2 ;in Dec 2010/2011  . PONV (postoperative nausea and vomiting)   . Right leg weakness     Past Surgical History:  Procedure Laterality Date  . ABDOMINAL  HYSTERECTOMY     at age 37  . BACK SURGERY  20+yrs ago  . CATARACT EXTRACTION W/PHACO Left 01/04/2019   Procedure:  CATARACT EXTRACTION PHACO AND INTRAOCULAR LENS PLACEMENT ([place])  LEFT;  Surgeon: Leandrew Koyanagi, MD;  Location: [place];  Service: Ophthalmology;  Laterality: Left;  GIVE IV ZOFRAN  . CATARACT EXTRACTION W/PHACO Right 02/01/2019   Procedure: CATARACT EXTRACTION PHACO AND INTRAOCULAR LENS PLACEMENT ([place])  RIGHT DIABETIC;  Surgeon: Leandrew Koyanagi, MD;  Location: [place];  Service: Ophthalmology;  Laterality: Right;  DIABETIC GIVE IV ZOFRAN  . COLONOSCOPY    . COLONOSCOPY N/A 05/13/2015   Procedure: COLONOSCOPY;  Surgeon: Hulen Luster, MD;  Location: [facility] ENDOSCOPY;  Service: Gastroenterology;  Laterality: N/A;  . COLONOSCOPY WITH PROPOFOL N/A 05/10/2020   Procedure: COLONOSCOPY WITH PROPOFOL;  Surgeon: Virgel Manifold, MD;  Location: [facility] ENDOSCOPY;  Service: Endoscopy;  Laterality: N/A;  . ESOPHAGOGASTRODUODENOSCOPY  2011  . ESOPHAGOGASTRODUODENOSCOPY (EGD) WITH PROPOFOL N/A 09/02/2017   Procedure: ESOPHAGOGASTRODUODENOSCOPY (EGD) WITH PROPOFOL;  Surgeon: Virgel Manifold, MD;  Location: [facility] ENDOSCOPY;  Service: Endoscopy;  Laterality: N/A;  . FOOT SURGERY  2011   bunionectomy and knot removed from bottom of both feet  . JOINT REPLACEMENT Left 2014   hip  Kraskinski  . LUMBAR LAMINECTOMY/DECOMPRESSION MICRODISCECTOMY  09/10/2011   Procedure: LUMBAR LAMINECTOMY/DECOMPRESSION MICRODISCECTOMY 1 LEVEL;  Surgeon: Hosie Spangle, MD;  Location: [place] NEURO ORS;  Service: Neurosurgery;  Laterality: Right;  RIGHT Lumbar  Laminotomy and microdiskectomy Lumbar Four-Five  . LUMBAR LAMINECTOMY/DECOMPRESSION MICRODISCECTOMY N/A 06/26/2015   Procedure: LUMBAR LAMINECTOMY/DECOMPRESSION MICRODISCECTOMY 1 LEVEL;  Surgeon: Jovita Gamma, MD;  Location: [place] NEURO ORS;  Service: Neurosurgery;  Laterality: N/A;  L3 and L4 Laminectomies  . MELANOMA EXCISION  2011    on nose/back on neck  . THORACIC DISC SURGERY  at age 39 and 75    mass of veins that had gotten into muscle and wrapped around rib;3 ribs also removed from left side  . TONSILLECTOMY  as a child   and adenoids   . TOTAL HIP ARTHROPLASTY Left 2014   Dr. Mack Guise  . TUBAL LIGATION    . VAGINA SURGERY     vaginal wall ruptured     Family History  Problem Relation Age of Onset  . Heart disease Brother        valve replacement  . COPD Brother   . COPD Mother        Emphysema  . Diabetes Mother   . Other Mother        Died in house fire at age 57.  Marland Kitchen Heart disease Father   . Seizures Father   . Cancer Father        Lung cancer with brain metastasis  . Asthma Brother   . COPD Son 29  . Cancer Other        aunt ? maternal vs paternal side lung cancer   . Diabetes Maternal Grandmother   . Anesthesia problems Neg Hx   . Hypotension Neg Hx   . Malignant hyperthermia Neg Hx   . Pseudochol deficiency Neg Hx   . Thyroid disease Neg Hx   . Breast cancer Neg Hx     Social History:  reports that she quit smoking about 4 years ago. Her smoking use included cigarettes. She has a 5.00 pack-year smoking history. She has never used smokeless tobacco. She reports that she does not drink alcohol and does not use drugs.  Allergies:  Allergies  Allergen Reactions  . Vicodin [Hydrocodone-Acetaminophen] Other (See Comments)    Insomnia   . Vicodin Hp [Hydrocodone-Acetaminophen]  Review of Systems  She is on basal insulin with Levemir and Metformin for diabetes and followed by PCP A1c as follows   Lab Results  Component Value Date   HGBA1C 7.8 (H) 09/12/2020   HGBA1C 7.5 (H) 06/13/2020   HGBA1C 7.3 (A) 04/22/2020   Lab Results  Component Value Date   MICROALBUR 1.8 02/20/2020   LDLCALC 67 09/12/2020   CREATININE 0.90 09/12/2020      Examination:   There were no vitals taken for this visit.  Patient reports her weight to be 164    Assessment/Plan:  Post ablative  hypothyroidism:  She has had I-131 treatment on 01/02/2020 and subsequently became hypothyroid in 04/2020  She is doing well with overall energy level although has had some gradual weight gain No complaints of fatigue or cold intolerance She is quite consistent with her regimen of levothyroxine  TSH is now consistently normal  DIABETES: Her A1c was 7.8 and likely has postprandial hyperglycemia, is a candidate for a GLP-1 drug potentially for multiple benefits She will discuss with her PCP at upcoming visit, if needed can see her for diabetes also  Follow-up in 6 months  Duration of telephone encounter =5 minutes  Elayne Snare 11/04/2020, 8:12 AM    Note: This office note was prepared with Dragon voice recognition system technology. Any transcriptional errors that result from this process are unintentional.

## 2020-11-12 ENCOUNTER — Other Ambulatory Visit: Payer: Self-pay

## 2020-11-12 ENCOUNTER — Encounter: Payer: Self-pay | Admitting: Internal Medicine

## 2020-11-12 ENCOUNTER — Ambulatory Visit: Payer: Medicare HMO | Admitting: Internal Medicine

## 2020-11-12 VITALS — BP 126/80 | HR 64 | Temp 96.7°F | Resp 15 | Ht 64.0 in | Wt 161.2 lb

## 2020-11-12 DIAGNOSIS — Z794 Long term (current) use of insulin: Secondary | ICD-10-CM | POA: Diagnosis not present

## 2020-11-12 DIAGNOSIS — E538 Deficiency of other specified B group vitamins: Secondary | ICD-10-CM

## 2020-11-12 DIAGNOSIS — E1140 Type 2 diabetes mellitus with diabetic neuropathy, unspecified: Secondary | ICD-10-CM

## 2020-11-12 MED ORDER — OZEMPIC (0.25 OR 0.5 MG/DOSE) 2 MG/1.5ML ~~LOC~~ SOPN: 0.5000 mg | PEN_INJECTOR | SUBCUTANEOUS | 1 refills | Status: DC

## 2020-11-12 MED ORDER — TEMAZEPAM 15 MG PO CAPS: 15.0000 mg | ORAL_CAPSULE | Freq: Every evening | ORAL | 5 refills | Status: DC | PRN

## 2020-11-12 NOTE — Progress Notes (Signed)
Subjective:  Patient ID: Maria Hinton, female    DOB: January 26, 1946  Age: 75 y.o. MRN: [id number]  CC: The primary encounter diagnosis was Type 2 diabetes mellitus with diabetic neuropathy, with long-term current use of insulin ([place]). A diagnosis of B12 deficiency was also pertinent to this visit.  HPI COTI BURD presents for follow up on type 2 DM with hyperlipidemia and neuropathy  This visit occurred during the SARS-CoV-2 public health emergency.  Safety protocols were in place, including screening questions prior to the visit, additional usage of staff PPE, and extensive cleaning of exam room while observing appropriate contact time as indicated for disinfecting solutions.     T2DM:  She  feels generally well,  But is not  exercising regularly or trying to lose weight. Checking  blood sugars less than once daily at variable times, usually only if she feels she may be having a hypoglycemic event. .  BS have been under 150 fasting and <  300 post prandially.  Denies any recent hypoglyemic events.  Taking   medications as directed. Following a carbohydrate modified diet 6 days per week. Denies numbness, burning and tingling of extremities. Appetite is good.  She is Using 18 units of lantus  And 1000 mg metformin bid.    New onset ulnar nerve neuropathy with numbness and tingling of left arm without weakness.    Lab Results  Component Value Date   HGBA1C 7.8 (H) 09/12/2020   Lab Results  Component Value Date   CHOL 149 09/12/2020   HDL 52.50 09/12/2020   LDLCALC 67 09/12/2020   LDLDIRECT 67.0 02/20/2020   TRIG 151.0 (H) 09/12/2020   CHOLHDL 3 09/12/2020   Lab Results  Component Value Date   CREATININE 0.90 09/12/2020   Lab Results  Component Value Date   MICROALBUR 1.8 02/20/2020   MICROALBUR 0.7 04/14/2019        Outpatient Medications Prior to Visit  Medication Sig Dispense Refill  . albuterol (PROVENTIL HFA;VENTOLIN HFA) 108 (90 Base) MCG/ACT inhaler Inhale 2  puffs into the lungs every 6 (six) hours as needed for wheezing. 6.7 g 11  . aspirin EC 81 MG tablet Take 81 mg by mouth daily.    Marland Kitchen atenolol (TENORMIN) 50 MG tablet Take 1 tablet (50 mg total) by mouth 2 (two) times daily. 180 tablet 1  . atorvastatin (LIPITOR) 40 MG tablet TAKE 1 TABLET BY MOUTH DAILY AT 6 PM. 90 tablet 3  . BD PEN NEEDLE NANO U/F 32G X 4 MM MISC USE DAILY AS DIRECTED WITH LEVEMIR 100 each 1  . gabapentin (NEURONTIN) 100 MG capsule TAKE 2 CAPSULES (200 MG TOTAL) BY MOUTH 3 (THREE) TIMES DAILY. 180 capsule 3  . insulin detemir (LEVEMIR FLEXTOUCH) 100 UNIT/ML FlexPen Inject 18 Units into the skin at bedtime. 15 mL 0  . levothyroxine (SYNTHROID) 112 MCG tablet TAKE 1 TABLET BY MOUTH EVERY DAY 90 tablet 1  . losartan (COZAAR) 50 MG tablet Take 1 tablet by mouth daily.    . metFORMIN (GLUCOPHAGE) 1000 MG tablet TAKE 1 TABLET BY MOUTH 2 TIMES DAILY WITH A MEAL. 180 tablet 1  . omeprazole (PRILOSEC) 40 MG capsule TAKE 1 CAPSULE BY MOUTH EVERY DAY 90 capsule 1  . OneTouch Delica Lancets 02V MISC 1 application by Does not apply route 2 (two) times daily. Use to check blood sugar up to two times daily. E11.69 100 each 11  . ONETOUCH VERIO test strip USE AS INSTRUCTED 100 strip 12  .  traMADol (ULTRAM) 50 MG tablet TAKE 1 TABLET BY MOUTH EVERY 6 HOURS AS NEEDED FOR SPINAL STENOSIS PAIN 120 tablet 2  . temazepam (RESTORIL) 15 MG capsule Take 1 capsule (15 mg total) by mouth at bedtime as needed for sleep. 30 capsule 2  . buPROPion (WELLBUTRIN XL) 150 MG 24 hr tablet TAKE 1 TABLET BY MOUTH EVERY DAY (Patient not taking: Reported on 09/04/2020) 90 tablet 2  . losartan (COZAAR) 100 MG tablet Take 1 tablet (100 mg total) by mouth daily. (Patient not taking: Reported on 11/12/2020) 90 tablet 0   No facility-administered medications prior to visit.    Review of Systems;  Patient denies headache, fevers, malaise, unintentional weight loss, skin rash, eye pain, sinus congestion and sinus pain, sore  throat, dysphagia,  hemoptysis , cough, dyspnea, wheezing, chest pain, palpitations, orthopnea, edema, abdominal pain, nausea, melena, diarrhea, constipation, flank pain, dysuria, hematuria, urinary  Frequency, nocturia, numbness, tingling, seizures,  Focal weakness, Loss of consciousness,  Tremor, insomnia, depression, anxiety, and suicidal ideation.      Objective:  BP 126/80 (BP Location: Left Arm, Patient Position: Sitting, Cuff Size: Normal)   Pulse 64   Temp (!) 96.7 F (35.9 C) (Temporal)   Resp 15   Ht 5\' 4"  (1.626 m)   Wt 161 lb 3.2 oz (73.1 kg)   SpO2 97%   BMI 27.67 kg/m   BP Readings from Last 3 Encounters:  11/12/20 126/80  08/07/20 112/70  05/10/20 (!) 155/67    Wt Readings from Last 3 Encounters:  11/12/20 161 lb 3.2 oz (73.1 kg)  09/04/20 163 lb (73.9 kg)  08/07/20 161 lb 9.6 oz (73.3 kg)    General appearance: alert, cooperative and appears stated age Ears: normal TM's and external ear canals both ears Throat: lips, mucosa, and tongue normal; teeth and gums normal Neck: no adenopathy, no carotid bruit, supple, symmetrical, trachea midline and thyroid not enlarged, symmetric, no tenderness/mass/nodules Back: symmetric, no curvature. ROM normal. No CVA tenderness. Lungs: clear to auscultation bilaterally Heart: regular rate and rhythm, S1, S2 normal, no murmur, click, rub or gallop Abdomen: soft, non-tender; bowel sounds normal; no masses,  no organomegaly Pulses: 2+ and symmetric Skin: Skin color, texture, turgor normal. No rashes or lesions Lymph nodes: Cervical, supraclavicular, and axillary nodes normal.  Lab Results  Component Value Date   HGBA1C 7.8 (H) 09/12/2020   HGBA1C 7.5 (H) 06/13/2020   HGBA1C 7.3 (A) 04/22/2020    Lab Results  Component Value Date   CREATININE 0.90 09/12/2020   CREATININE 0.97 06/13/2020   CREATININE 0.83 02/20/2020    Lab Results  Component Value Date   WBC 6.4 04/21/2018   HGB 14.5 04/21/2018   HCT 42.7  04/21/2018   PLT 324.0 04/21/2018   GLUCOSE 138 (H) 09/12/2020   CHOL 149 09/12/2020   TRIG 151.0 (H) 09/12/2020   HDL 52.50 09/12/2020   LDLDIRECT 67.0 02/20/2020   LDLCALC 67 09/12/2020   ALT 11 09/12/2020   AST 13 09/12/2020   NA 137 09/12/2020   K 4.7 09/12/2020   CL 99 09/12/2020   CREATININE 0.90 09/12/2020   BUN 12 09/12/2020   CO2 30 09/12/2020   TSH 2.13 10/10/2020   INR 0.88 02/17/2018   HGBA1C 7.8 (H) 09/12/2020   MICROALBUR 1.8 02/20/2020    MM 3D SCREEN BREAST BILATERAL  Result Date: 09/24/2020 CLINICAL DATA:  Screening. EXAM: DIGITAL SCREENING BILATERAL MAMMOGRAM WITH TOMOSYNTHESIS AND CAD TECHNIQUE: Bilateral screening digital craniocaudal and mediolateral oblique mammograms were  obtained. Bilateral screening digital breast tomosynthesis was performed. The images were evaluated with computer-aided detection. COMPARISON:  Previous exam(s). ACR Breast Density Category b: There are scattered areas of fibroglandular density. FINDINGS: There are no findings suspicious for malignancy. The images were evaluated with computer-aided detection. IMPRESSION: No mammographic evidence of malignancy. A result letter of this screening mammogram will be mailed directly to the patient. RECOMMENDATION: Screening mammogram in one year. (Code:SM-B-01Y) BI-RADS CATEGORY  1: Negative. Electronically Signed   By: Claudie Revering M.D.   On: 09/24/2020 16:05    Assessment & Plan:   Problem List Items Addressed This Visit      Unprioritized   B12 deficiency   Relevant Orders   Vitamin B12   Type 2 diabetes mellitus with diabetic neuropathy, unspecified ([place]) - Primary    Not well controlled on current regimen on lantus 18 units and metformin 1000 mg bid .  adding ozempic starting at 0.25 mg weekly. Continue asa,  ARB and statin   Lab Results  Component Value Date   HGBA1C 7.8 (H) 09/12/2020   Lab Results  Component Value Date   MICROALBUR 1.8 02/20/2020   MICROALBUR 0.7 04/14/2019    Lab Results  Component Value Date   CHOL 149 09/12/2020   HDL 52.50 09/12/2020   LDLCALC 67 09/12/2020   LDLDIRECT 67.0 02/20/2020   TRIG 151.0 (H) 09/12/2020   CHOLHDL 3 09/12/2020          Relevant Medications   losartan (COZAAR) 50 MG tablet   Semaglutide,0.25 or 0.5MG /DOS, (OZEMPIC, 0.25 OR 0.5 MG/DOSE,) 2 MG/1.5ML SOPN   Other Relevant Orders   Hemoglobin A1c   Comprehensive metabolic panel   Lipid panel      I have discontinued Erin F. Stammer's buPROPion. I am also having her start on Ozempic (0.25 or 0.5 MG/DOSE). Additionally, I am having her maintain her aspirin EC, albuterol, OneTouch Verio, OneTouch Delica Lancets 59F, BD Pen Needle Nano U/F, metFORMIN, traMADol, levothyroxine, gabapentin, atenolol, omeprazole, atorvastatin, Levemir FlexTouch, losartan, and temazepam.  Meds ordered this encounter  Medications  . Semaglutide,0.25 or 0.5MG /DOS, (OZEMPIC, 0.25 OR 0.5 MG/DOSE,) 2 MG/1.5ML SOPN    Sig: Inject 0.5 mg into the skin once a week.    Dispense:  1.5 mL    Refill:  1  . temazepam (RESTORIL) 15 MG capsule    Sig: Take 1 capsule (15 mg total) by mouth at bedtime as needed for sleep.    Dispense:  30 capsule    Refill:  5    Medications Discontinued During This Encounter  Medication Reason  . buPROPion (WELLBUTRIN XL) 150 MG 24 hr tablet   . losartan (COZAAR) 100 MG tablet Change in therapy  . temazepam (RESTORIL) 15 MG capsule Reorder    Follow-up: Return in about 6 months (around 05/15/2021).   Crecencio Mc, MD

## 2020-11-12 NOTE — Patient Instructions (Addendum)
We are starting you on OZEMPIC  to help you control your diabetes with less insulin and help you lose weight   10 clicks of the dial gets you the dose of 0.25 mg to use ONCE  A WEEK  REDUCE LEVEMIR DOSE FROM 18 UNITS TO 15 UNITS   CONTINUE METFORMIN  RETURN FOR FASTING LABS AFTER JuNE 11    When your'e not sure what to eat:  Eggs  With whole wheat toast.  Avoid bagels and muffins Cereal is pretty high, and the serving size is not realistic! Berries and cherries are the lowest.    Apples,  Oranges next.  AVOID BANANAS  GRAPES AND PINEAPPLE   Healthy choice low carb power bowls

## 2020-11-14 NOTE — Assessment & Plan Note (Signed)
Not well controlled on current regimen on lantus 18 units and metformin 1000 mg bid .  adding ozempic starting at 0.25 mg weekly. Continue asa,  ARB and statin   Lab Results  Component Value Date   HGBA1C 7.8 (H) 09/12/2020   Lab Results  Component Value Date   MICROALBUR 1.8 02/20/2020   MICROALBUR 0.7 04/14/2019   Lab Results  Component Value Date   CHOL 149 09/12/2020   HDL 52.50 09/12/2020   LDLCALC 67 09/12/2020   LDLDIRECT 67.0 02/20/2020   TRIG 151.0 (H) 09/12/2020   CHOLHDL 3 09/12/2020

## 2020-11-28 ENCOUNTER — Other Ambulatory Visit: Payer: Self-pay | Admitting: Internal Medicine

## 2020-11-29 ENCOUNTER — Other Ambulatory Visit: Payer: Self-pay | Admitting: Internal Medicine

## 2020-11-30 ENCOUNTER — Other Ambulatory Visit: Payer: Self-pay | Admitting: Internal Medicine

## 2020-12-11 ENCOUNTER — Other Ambulatory Visit: Payer: Self-pay | Admitting: Internal Medicine

## 2020-12-13 ENCOUNTER — Other Ambulatory Visit: Payer: Self-pay | Admitting: Internal Medicine

## 2020-12-16 ENCOUNTER — Other Ambulatory Visit (INDEPENDENT_AMBULATORY_CARE_PROVIDER_SITE_OTHER): Payer: Medicare HMO

## 2020-12-16 ENCOUNTER — Other Ambulatory Visit: Payer: Self-pay

## 2020-12-16 DIAGNOSIS — E1140 Type 2 diabetes mellitus with diabetic neuropathy, unspecified: Secondary | ICD-10-CM | POA: Diagnosis not present

## 2020-12-16 DIAGNOSIS — E538 Deficiency of other specified B group vitamins: Secondary | ICD-10-CM | POA: Diagnosis not present

## 2020-12-16 DIAGNOSIS — Z794 Long term (current) use of insulin: Secondary | ICD-10-CM | POA: Diagnosis not present

## 2020-12-16 LAB — COMPREHENSIVE METABOLIC PANEL
ALT: 10 U/L (ref 0–35)
AST: 13 U/L (ref 0–37)
Albumin: 4.3 g/dL (ref 3.5–5.2)
Alkaline Phosphatase: 103 U/L (ref 39–117)
BUN: 7 mg/dL (ref 6–23)
CO2: 25 mEq/L (ref 19–32)
Calcium: 8.7 mg/dL (ref 8.4–10.5)
Chloride: 101 mEq/L (ref 96–112)
Creatinine, Ser: 0.79 mg/dL (ref 0.40–1.20)
GFR: 74.51 mL/min (ref >60.00)
Glucose, Bld: 94 mg/dL (ref 70–99)
Potassium: 5 mEq/L (ref 3.5–5.1)
Sodium: 138 mEq/L (ref 135–145)
Total Bilirubin: 0.6 mg/dL (ref 0.2–1.2)
Total Protein: 6.8 g/dL (ref 6.0–8.3)

## 2020-12-16 LAB — LIPID PANEL
Cholesterol: 121 mg/dL (ref 0–200)
HDL: 40 mg/dL (ref >39.00)
LDL Cholesterol: 50 mg/dL (ref 0–99)
NonHDL: 80.67
Total CHOL/HDL Ratio: 3
Triglycerides: 154 mg/dL — ABNORMAL HIGH (ref 0.0–149.0)
VLDL: 30.8 mg/dL (ref 0.0–40.0)

## 2020-12-16 LAB — VITAMIN B12: Vitamin B-12: >1550 pg/mL — ABNORMAL HIGH (ref 211–911)

## 2020-12-16 LAB — HEMOGLOBIN A1C: Hgb A1c MFr Bld: 7.3 % — ABNORMAL HIGH (ref 4.6–6.5)

## 2020-12-17 ENCOUNTER — Other Ambulatory Visit: Payer: Self-pay | Admitting: Internal Medicine

## 2020-12-23 LAB — HM DIABETES EYE EXAM

## 2021-01-21 ENCOUNTER — Other Ambulatory Visit: Payer: Self-pay | Admitting: Internal Medicine

## 2021-01-21 ENCOUNTER — Other Ambulatory Visit: Payer: Self-pay | Admitting: Endocrinology

## 2021-02-14 ENCOUNTER — Other Ambulatory Visit: Payer: Self-pay

## 2021-02-14 ENCOUNTER — Encounter: Payer: Self-pay | Admitting: Internal Medicine

## 2021-02-14 ENCOUNTER — Ambulatory Visit: Payer: Medicare HMO | Admitting: Internal Medicine

## 2021-02-14 VITALS — BP 142/84 | HR 84 | Temp 96.2°F | Resp 15 | Ht 64.0 in | Wt 151.8 lb

## 2021-02-14 DIAGNOSIS — G8929 Other chronic pain: Secondary | ICD-10-CM | POA: Diagnosis not present

## 2021-02-14 DIAGNOSIS — M25511 Pain in right shoulder: Secondary | ICD-10-CM

## 2021-02-14 DIAGNOSIS — M25552 Pain in left hip: Secondary | ICD-10-CM | POA: Diagnosis not present

## 2021-02-14 DIAGNOSIS — Z794 Long term (current) use of insulin: Secondary | ICD-10-CM

## 2021-02-14 DIAGNOSIS — E1140 Type 2 diabetes mellitus with diabetic neuropathy, unspecified: Secondary | ICD-10-CM | POA: Diagnosis not present

## 2021-02-14 MED ORDER — ONETOUCH VERIO VI STRP: ORAL_STRIP | 12 refills | Status: AC

## 2021-02-14 MED ORDER — GABAPENTIN 300 MG PO CAPS: 300.0000 mg | ORAL_CAPSULE | Freq: Three times a day (TID) | ORAL | 1 refills | Status: DC

## 2021-02-14 MED ORDER — MELOXICAM 7.5 MG PO TABS: 7.5000 mg | ORAL_TABLET | Freq: Every day | ORAL | 0 refills | Status: DC

## 2021-02-14 NOTE — Progress Notes (Signed)
Subjective:  Patient ID: Maria Hinton, female    DOB: 07/19/1945  Age: 75 y.o. MRN: [id number]  CC: The primary encounter diagnosis was Chronic right shoulder pain. Diagnoses of Left hip pain and Type 2 diabetes mellitus with diabetic neuropathy, with long-term current use of insulin ([place]) were also pertinent to this visit.  HPI Maria Hinton presents for diabetes follow up  This visit occurred during the SARS-CoV-2 public health emergency.  Safety protocols were in place, including screening questions prior to the visit, additional usage of staff PPE, and extensive cleaning of exam room while observing appropriate contact time as indicated for disinfecting solutions.   Type 2 DM:  Last a1c was 7.3 in mid June .  She has been checking BS twice daily.  All CBGS have been at goal except for post prandials >200 after eating pasta and potatoes advised to walk after those meals.  Talking levemir 15 units daily.  Metformin 1000 mg twice daily,  and ozempic 0.5 mg daily    Diabetic neuropathy:  using Gabapentin :  ran out of the 100 mg capsules due to pharmacy noncoverage . Neuropathy is worse without the medication.  Has several times in the last several months.  Lands on knees and left side   Right shoulder pain aggravated by extending/reaching for things and picking up anything  also rolling over in bed.    3 months duration , no history of fall or unusual activity.    Using  tramadol which provides transient relief . B Aggravated by recent use of a weed eater   Pain in lower back is chronic but for the last several weeks has started radiating to groin .    Aggravated by using riding lawn mower  and seating sideways on the seat during mowing of hill   Outpatient Medications Prior to Visit  Medication Sig Dispense Refill   albuterol (PROVENTIL HFA;VENTOLIN HFA) 108 (90 Base) MCG/ACT inhaler Inhale 2 puffs into the lungs every 6 (six) hours as needed for wheezing. 6.7 g 11   aspirin EC 81 MG  tablet Take 81 mg by mouth daily.     atenolol (TENORMIN) 50 MG tablet TAKE 1 TABLET BY MOUTH TWICE A DAY 180 tablet 1   atorvastatin (LIPITOR) 40 MG tablet TAKE 1 TABLET BY MOUTH DAILY AT 6 PM. 90 tablet 3   BD PEN NEEDLE NANO 2ND GEN 32G X 4 MM MISC USE DAILY AS DIRECTED WITH LEVEMIR 100 each 1   LEVEMIR FLEXTOUCH 100 UNIT/ML FlexPen INJECT 15 UNITS INTO THE SKIN DAILY AT 10 PM. 15 mL 2   levothyroxine (SYNTHROID) 112 MCG tablet TAKE 1 TABLET BY MOUTH EVERY DAY 90 tablet 1   losartan (COZAAR) 50 MG tablet TAKE 1 TABLET BY MOUTH EVERY DAY 90 tablet 3   metFORMIN (GLUCOPHAGE) 1000 MG tablet TAKE 1 TABLET BY MOUTH 2 TIMES DAILY WITH A MEAL. 180 tablet 1   omeprazole (PRILOSEC) 40 MG capsule TAKE 1 CAPSULE BY MOUTH EVERY DAY 90 capsule 1   OneTouch Delica Lancets 99991111 MISC 1 application by Does not apply route 2 (two) times daily. Use to check blood sugar up to two times daily. E11.69 100 each 11   Semaglutide,0.25 or 0.'5MG'$ /DOS, (OZEMPIC, 0.25 OR 0.5 MG/DOSE,) 2 MG/1.5ML SOPN Inject 0.5 mg into the skin once a week. 1.5 mL 1   temazepam (RESTORIL) 15 MG capsule Take 1 capsule (15 mg total) by mouth at bedtime as needed for sleep. 30 capsule 5  traMADol (ULTRAM) 50 MG tablet TAKE 1 TABLET BY MOUTH EVERY 6 HOURS AS NEEDED FOR SPINAL STENOSIS PAIN 120 tablet 2   ONETOUCH VERIO test strip USE AS INSTRUCTED 100 strip 12   gabapentin (NEURONTIN) 100 MG capsule TAKE 2 CAPSULES BY MOUTH 3 TIMES DAILY. (Patient not taking: Reported on 02/14/2021) 180 capsule 3   No facility-administered medications prior to visit.    Review of Systems;  Patient denies headache, fevers, malaise, unintentional weight loss, skin rash, eye pain, sinus congestion and sinus pain, sore throat, dysphagia,  hemoptysis , cough, dyspnea, wheezing, chest pain, palpitations, orthopnea, edema, abdominal pain, nausea, melena, diarrhea, constipation, flank pain, dysuria, hematuria, urinary  Frequency, nocturia, numbness, tingling,  seizures,  Focal weakness, Loss of consciousness,  Tremor, insomnia, depression, anxiety, and suicidal ideation.      Objective:  BP (!) 142/84 (BP Location: Left Arm, Patient Position: Sitting, Cuff Size: Large)   Pulse 84   Temp (!) 96.2 F (35.7 C) (Temporal)   Resp 15   Ht '5\' 4"'$  (1.626 m)   Wt 151 lb 12.8 oz (68.9 kg)   SpO2 98%   BMI 26.06 kg/m   BP Readings from Last 3 Encounters:  02/14/21 (!) 142/84  11/12/20 126/80  08/07/20 112/70    Wt Readings from Last 3 Encounters:  02/14/21 151 lb 12.8 oz (68.9 kg)  11/12/20 161 lb 3.2 oz (73.1 kg)  09/04/20 163 lb (73.9 kg)    General appearance: alert, cooperative and appears stated age Ears: normal TM's and external ear canals both ears Throat: lips, mucosa, and tongue normal; teeth and gums normal Neck: no adenopathy, no carotid bruit, supple, symmetrical, trachea midline and thyroid not enlarged, symmetric, no tenderness/mass/nodules Back: symmetric, no curvature. ROM normal. No CVA tenderness. Lungs: clear to auscultation bilaterally Heart: regular rate and rhythm, S1, S2 normal, no murmur, click, rub or gallop Abdomen: soft, non-tender; bowel sounds normal; no masses,  no organomegaly Pulses: 2+ and symmetric  pain with all active ROM , none with passive  Skin: Skin color, texture, turgor normal. No rashes or lesions Lymph nodes: Cervical, supraclavicular, and axillary nodes normal.  Lab Results  Component Value Date   HGBA1C 7.3 (H) 12/16/2020   HGBA1C 7.8 (H) 09/12/2020   HGBA1C 7.5 (H) 06/13/2020    Lab Results  Component Value Date   CREATININE 0.79 12/16/2020   CREATININE 0.90 09/12/2020   CREATININE 0.97 06/13/2020    Lab Results  Component Value Date   WBC 6.4 04/21/2018   HGB 14.5 04/21/2018   HCT 42.7 04/21/2018   PLT 324.0 04/21/2018   GLUCOSE 94 12/16/2020   CHOL 121 12/16/2020   TRIG 154.0 (H) 12/16/2020   HDL 40.00 12/16/2020   LDLDIRECT 67.0 02/20/2020   LDLCALC 50 12/16/2020   ALT  10 12/16/2020   AST 13 12/16/2020   NA 138 12/16/2020   K 5.0 12/16/2020   CL 101 12/16/2020   CREATININE 0.79 12/16/2020   BUN 7 12/16/2020   CO2 25 12/16/2020   TSH 2.13 10/10/2020   INR 0.88 02/17/2018   HGBA1C 7.3 (H) 12/16/2020   MICROALBUR 1.8 02/20/2020    MM 3D SCREEN BREAST BILATERAL  Result Date: 09/24/2020 CLINICAL DATA:  Screening. EXAM: DIGITAL SCREENING BILATERAL MAMMOGRAM WITH TOMOSYNTHESIS AND CAD TECHNIQUE: Bilateral screening digital craniocaudal and mediolateral oblique mammograms were obtained. Bilateral screening digital breast tomosynthesis was performed. The images were evaluated with computer-aided detection. COMPARISON:  Previous exam(s). ACR Breast Density Category b: There are scattered areas  of fibroglandular density. FINDINGS: There are no findings suspicious for malignancy. The images were evaluated with computer-aided detection. IMPRESSION: No mammographic evidence of malignancy. A result letter of this screening mammogram will be mailed directly to the patient. RECOMMENDATION: Screening mammogram in one year. (Code:SM-B-01Y) BI-RADS CATEGORY  1: Negative. Electronically Signed   By: Claudie Revering M.D.   On: 09/24/2020 16:05    Assessment & Plan:   Problem List Items Addressed This Visit       Unprioritized   Type 2 diabetes mellitus with diabetic neuropathy, unspecified ([place])    She has not increased the levemir beyond 15 units.  All sugars are at goal except post prandial sugars after eating pasta or potatoes.  Advised to go for walk after evening meal. Continue levemir. ozempic and metformin   Lab Results  Component Value Date   HGBA1C 7.3 (H) 12/16/2020    Lab Results  Component Value Date   LABMICR 25.6 02/14/2021   MICROALBUR 1.8 02/20/2020   MICROALBUR 0.7 04/14/2019            Relevant Orders   Hemoglobin A1c   Comprehensive metabolic panel   TSH   Lipid panel   Microalbumin / creatinine urine ratio (Completed)   Chronic right  shoulder pain - Primary    Secondary to lawn activities.  No workup needed unless pain persists      Relevant Medications   gabapentin (NEURONTIN) 300 MG capsule   meloxicam (MOBIC) 7.5 MG tablet   Left hip pain    ROM is normal , plain films ordered to assess for spurring and OA       Meds ordered this encounter  Medications   glucose blood (ONETOUCH VERIO) test strip    Sig: Use as instructed to check blood sugars 3 times daily    Dispense:  100 strip    Refill:  12   gabapentin (NEURONTIN) 300 MG capsule    Sig: Take 1 capsule (300 mg total) by mouth 3 (three) times daily.    Dispense:  90 capsule    Refill:  1   meloxicam (MOBIC) 7.5 MG tablet    Sig: Take 1 tablet (7.5 mg total) by mouth daily.    Dispense:  30 tablet    Refill:  0   I spent 30 mintutes dedicated to the care of this patient on the date of this encounter to include pre-visit review of his medical history,  Face-to-face time with the patient , and post visit ordering of testing and therapeutics.   Medications Discontinued During This Encounter  Medication Reason   ONETOUCH VERIO test strip Reorder   gabapentin (NEURONTIN) 100 MG capsule     Follow-up: No follow-ups on file.   Crecencio Mc, MD

## 2021-02-14 NOTE — Patient Instructions (Addendum)
Restarting meloxicam for your joint pain.  This is an 'ANTI INFLAMMATORY"  SO DO NOT  combine with ibuprofen  (choose one OR THE other)  DO combine with tramAdol and tylenol   You can add up to 2000 mg of acetominophen (tylenol) every day safely  In divided doses (500 mg every 6 hours  Or 1000 mg every 12 hours.)  You can eat your pasta or potatoes , but go for a 20 minute walk afterward and  that should bring your sugar down  Your insurance won't cover the lower dose of gabapentin so I have refilled the 300 mg dose.  If it is too strong,  let me know  The labs should be repeated after sept 13

## 2021-02-15 LAB — MICROALBUMIN / CREATININE URINE RATIO
Creatinine, Urine: 157.2 mg/dL
Microalb/Creat Ratio: 16 mg/g creat (ref 0–29)
Microalbumin, Urine: 25.6 ug/mL

## 2021-02-16 DIAGNOSIS — M25552 Pain in left hip: Secondary | ICD-10-CM | POA: Insufficient documentation

## 2021-02-16 DIAGNOSIS — M25511 Pain in right shoulder: Secondary | ICD-10-CM | POA: Insufficient documentation

## 2021-02-16 DIAGNOSIS — G8929 Other chronic pain: Secondary | ICD-10-CM | POA: Insufficient documentation

## 2021-02-16 NOTE — Assessment & Plan Note (Signed)
She has not increased the levemir beyond 15 units.  All sugars are at goal except post prandial sugars after eating pasta or potatoes.  Advised to go for walk after evening meal. Continue levemir. ozempic and metformin   Lab Results  Component Value Date   HGBA1C 7.3 (H) 12/16/2020    Lab Results  Component Value Date   LABMICR 25.6 02/14/2021   MICROALBUR 1.8 02/20/2020   MICROALBUR 0.7 04/14/2019

## 2021-02-16 NOTE — Assessment & Plan Note (Signed)
Secondary to lawn activities.  No workup needed unless pain persists

## 2021-02-16 NOTE — Assessment & Plan Note (Signed)
ROM is normal , plain films ordered to assess for spurring and OA

## 2021-03-05 ENCOUNTER — Other Ambulatory Visit: Payer: Self-pay | Admitting: Internal Medicine

## 2021-03-20 ENCOUNTER — Other Ambulatory Visit: Payer: Self-pay | Admitting: Endocrinology

## 2021-03-20 ENCOUNTER — Other Ambulatory Visit (INDEPENDENT_AMBULATORY_CARE_PROVIDER_SITE_OTHER): Payer: Medicare HMO

## 2021-03-20 ENCOUNTER — Other Ambulatory Visit: Payer: Self-pay

## 2021-03-20 DIAGNOSIS — E875 Hyperkalemia: Secondary | ICD-10-CM

## 2021-03-20 DIAGNOSIS — Z794 Long term (current) use of insulin: Secondary | ICD-10-CM | POA: Diagnosis not present

## 2021-03-20 DIAGNOSIS — E1140 Type 2 diabetes mellitus with diabetic neuropathy, unspecified: Secondary | ICD-10-CM | POA: Diagnosis not present

## 2021-03-20 LAB — COMPREHENSIVE METABOLIC PANEL
ALT: 10 U/L (ref 0–35)
AST: 13 U/L (ref 0–37)
Albumin: 4 g/dL (ref 3.5–5.2)
Alkaline Phosphatase: 95 U/L (ref 39–117)
BUN: 10 mg/dL (ref 6–23)
CO2: 27 mEq/L (ref 19–32)
Calcium: 8.6 mg/dL (ref 8.4–10.5)
Chloride: 106 mEq/L (ref 96–112)
Creatinine, Ser: 0.77 mg/dL (ref 0.40–1.20)
GFR: 76.7 mL/min (ref >60.00)
Glucose, Bld: 91 mg/dL (ref 70–99)
Potassium: 5.5 mEq/L — ABNORMAL HIGH (ref 3.5–5.1)
Sodium: 142 mEq/L (ref 135–145)
Total Bilirubin: 0.4 mg/dL (ref 0.2–1.2)
Total Protein: 6.3 g/dL (ref 6.0–8.3)

## 2021-03-20 LAB — LIPID PANEL
Cholesterol: 111 mg/dL (ref 0–200)
HDL: 45.1 mg/dL (ref >39.00)
LDL Cholesterol: 43 mg/dL (ref 0–99)
NonHDL: 66.06
Total CHOL/HDL Ratio: 2
Triglycerides: 115 mg/dL (ref 0.0–149.0)
VLDL: 23 mg/dL (ref 0.0–40.0)

## 2021-03-20 LAB — TSH: TSH: 0.69 u[IU]/mL (ref 0.35–5.50)

## 2021-03-20 LAB — HEMOGLOBIN A1C: Hgb A1c MFr Bld: 6.6 % — ABNORMAL HIGH (ref 4.6–6.5)

## 2021-03-24 ENCOUNTER — Other Ambulatory Visit (INDEPENDENT_AMBULATORY_CARE_PROVIDER_SITE_OTHER): Payer: Medicare HMO

## 2021-03-24 ENCOUNTER — Other Ambulatory Visit: Payer: Self-pay

## 2021-03-24 DIAGNOSIS — E890 Postprocedural hypothyroidism: Secondary | ICD-10-CM

## 2021-03-24 DIAGNOSIS — E875 Hyperkalemia: Secondary | ICD-10-CM

## 2021-03-24 NOTE — Telephone Encounter (Signed)
See pts other message

## 2021-03-24 NOTE — Addendum Note (Signed)
Addended by: Crecencio Mc on: 03/24/2021 12:12 AM   Modules accepted: Orders

## 2021-03-25 LAB — BASIC METABOLIC PANEL
BUN: 10 mg/dL (ref 6–23)
CO2: 28 mEq/L (ref 19–32)
Calcium: 8.5 mg/dL (ref 8.4–10.5)
Chloride: 100 mEq/L (ref 96–112)
Creatinine, Ser: 0.87 mg/dL (ref 0.40–1.20)
GFR: 66.24 mL/min (ref >60.00)
Glucose, Bld: 124 mg/dL — ABNORMAL HIGH (ref 70–99)
Potassium: 5.1 mEq/L (ref 3.5–5.1)
Sodium: 137 mEq/L (ref 135–145)

## 2021-03-25 MED ORDER — LEVOTHYROXINE SODIUM 112 MCG PO TABS: 112.0000 ug | ORAL_TABLET | Freq: Every day | ORAL | 1 refills | Status: DC

## 2021-03-26 DIAGNOSIS — E875 Hyperkalemia: Secondary | ICD-10-CM | POA: Insufficient documentation

## 2021-03-26 NOTE — Assessment & Plan Note (Signed)
Patient's potassium was elevated. She was asked to return for a repeat level which was normal .  No further workup needed at this time.  Continue regular surveillance.

## 2021-03-26 NOTE — Progress Notes (Addendum)
Patient's potassium was elevated. She was asked to return for a repeat level which was normal .  No further workup needed at this time.  Continue regular surveillance.

## 2021-03-28 ENCOUNTER — Other Ambulatory Visit: Payer: Self-pay | Admitting: Internal Medicine

## 2021-04-12 ENCOUNTER — Other Ambulatory Visit: Payer: Self-pay | Admitting: Internal Medicine

## 2021-05-13 ENCOUNTER — Other Ambulatory Visit: Payer: Medicare HMO

## 2021-05-14 ENCOUNTER — Other Ambulatory Visit: Payer: Self-pay

## 2021-05-14 ENCOUNTER — Other Ambulatory Visit: Payer: Self-pay | Admitting: Endocrinology

## 2021-05-14 ENCOUNTER — Other Ambulatory Visit (INDEPENDENT_AMBULATORY_CARE_PROVIDER_SITE_OTHER): Payer: Medicare HMO

## 2021-05-14 DIAGNOSIS — E890 Postprocedural hypothyroidism: Secondary | ICD-10-CM | POA: Diagnosis not present

## 2021-05-14 LAB — T4, FREE: Free T4: 0.96 ng/dL (ref 0.60–1.60)

## 2021-05-14 LAB — TSH: TSH: 2.7 u[IU]/mL (ref 0.35–5.50)

## 2021-05-22 ENCOUNTER — Encounter: Payer: Self-pay | Admitting: Endocrinology

## 2021-05-22 ENCOUNTER — Ambulatory Visit (INDEPENDENT_AMBULATORY_CARE_PROVIDER_SITE_OTHER): Payer: Medicare HMO | Admitting: Endocrinology

## 2021-05-22 ENCOUNTER — Other Ambulatory Visit: Payer: Self-pay

## 2021-05-22 VITALS — BP 138/70 | HR 70 | Ht 64.5 in | Wt 156.4 lb

## 2021-05-22 DIAGNOSIS — E890 Postprocedural hypothyroidism: Secondary | ICD-10-CM

## 2021-05-22 NOTE — Progress Notes (Signed)
Patient ID: Maria Hinton, female   DOB: 05-30-1946, 75 y.o.   MRN: [id number]                                                                                                              Reason for Appointment:  follow-up for thyroid    History of Present Illness:   Prior history of hyperthyroidism:  For several months before her consultation she had symptoms of weakness, palpitations, shakiness, feeling sweaty in am, nervousness, and fatigue.  Also would feel a strong heartbeat in her ears She had been on atenolol for hypertension also She had a decreased appetite and started losing weight late last summer The patient had lost about  26 lbs since these symptoms started   She started taking methimazole in 09/2017 for her hyperthyroidism On her 10/2017 visit she was taking 10 mg twice daily because of hyperthyroidism she was given 30 mg daily In late 2019 she appeared to be needing progressively higher doses of methimazole  On her visit in 12/19 she was having fatigue and weakness; did not complain of feeling shaky or having palpitations, no weight loss However her thyroid levels were significantly high with 7.5 mg of methimazole and the dose was further adjusted  She again had a relapse in 5/21 without methimazole  RECENT history:  She had I-131 treatment on 01/02/2020 with 25 mCi; the dose was prescribed by the radiologist This was done because of recurrent or persistent hyperthyroidism In 10/21 she was significantly hypothyroid with free T4 of 0.42  She is on a stable dose of Synthroid 112 mcg She takes this consistently every morning before breakfast, taking this with a full glass of water  She has not missed any doses  She feels fairly good and not complaining of tiredness Has had some fluctuation in her weight  TSH is consistently normal   Wt Readings from Last 3 Encounters:  05/22/21 156 lb 6.4 oz (70.9 kg)  02/14/21 151 lb 12.8 oz (68.9 kg)  11/12/20 161 lb 3.2 oz  (73.1 kg)     Thyrotropin receptor antibody slightly above normal as of 4/21   Thyroid function tests as follows:     Lab Results  Component Value Date   TSH 2.70 05/14/2021   TSH 0.69 03/20/2021   TSH 2.13 10/10/2020   FREET4 0.96 05/14/2021   FREET4 1.23 10/10/2020   FREET4 0.87 06/13/2020     Lab Results  Component Value Date   THYROTRECAB 2.08 (H) 10/11/2019   THYROTRECAB 1.66 07/13/2019   THYROTRECAB 1.79 (H) 03/30/2019     Allergies as of 05/22/2021       Reactions   Vicodin [hydrocodone-acetaminophen] Other (See Comments)   Insomnia    Acetaminophen    Other reaction(s): Insomnia   Vicodin Hp [hydrocodone-acetaminophen]         Medication List        Accurate as of May 22, 2021  1:12 PM. If you have any questions, ask your nurse or doctor.  albuterol 108 (90 Base) MCG/ACT inhaler Commonly known as: VENTOLIN HFA Inhale 2 puffs into the lungs every 6 (six) hours as needed for wheezing.   aspirin EC 81 MG tablet Take 81 mg by mouth daily.   atenolol 50 MG tablet Commonly known as: TENORMIN TAKE 1 TABLET BY MOUTH TWICE A DAY   atorvastatin 40 MG tablet Commonly known as: LIPITOR TAKE 1 TABLET BY MOUTH DAILY AT 6 PM.   BD Pen Needle Nano 2nd Gen 32G X 4 MM Misc Generic drug: Insulin Pen Needle USE DAILY AS DIRECTED WITH LEVEMIR   gabapentin 300 MG capsule Commonly known as: NEURONTIN TAKE 1 CAPSULE BY MOUTH THREE TIMES A DAY   Levemir FlexTouch 100 UNIT/ML FlexPen Generic drug: insulin detemir INJECT 15 UNITS INTO THE SKIN DAILY AT 10 PM.   levothyroxine 112 MCG tablet Commonly known as: SYNTHROID Take 1 tablet (112 mcg total) by mouth daily.   losartan 50 MG tablet Commonly known as: COZAAR TAKE 1 TABLET BY MOUTH EVERY DAY   meloxicam 7.5 MG tablet Commonly known as: MOBIC TAKE 1 TABLET BY MOUTH EVERY DAY   metFORMIN 1000 MG tablet Commonly known as: GLUCOPHAGE TAKE 1 TABLET BY MOUTH 2 TIMES DAILY WITH A MEAL.    omeprazole 40 MG capsule Commonly known as: PRILOSEC TAKE 1 CAPSULE BY MOUTH EVERY DAY   OneTouch Delica Lancets 77S Misc 1 application by Does not apply route 2 (two) times daily. Use to check blood sugar up to two times daily. E11.69   OneTouch Verio test strip Generic drug: glucose blood Use as instructed to check blood sugars 3 times daily   Ozempic (0.25 or 0.5 MG/DOSE) 2 MG/1.5ML Sopn Generic drug: Semaglutide(0.25 or 0.5MG /DOS) Inject 0.5 mg into the skin once a week.   temazepam 15 MG capsule Commonly known as: RESTORIL Take 1 capsule (15 mg total) by mouth at bedtime as needed for sleep.   traMADol 50 MG tablet Commonly known as: ULTRAM TAKE 1 TABLET BY MOUTH EVERY 6 HOURS AS NEEDED FOR SPINAL STENOSIS PAIN            Past Medical History:  Diagnosis Date   Actinic keratoses    follows with dermatology    Arthritis    Asthma    Chronic back pain    DDD,herniated disc/spondylosis/radiculopathy   COPD (chronic obstructive pulmonary disease) (HCC)    Diabetes mellitus    1980s   Early cataracts, bilateral    Dr. [name] [place]   GERD (gastroesophageal reflux disease)    takes Omeprazole daily   Hemorrhoids    History of colonic polyps    colonoscopy 04/2012 - Dr Candace Cruise   Hyperlipidemia    takes Pravastatin daily   Hypertension    takes Metoprolol/HCTZ daily   Hyperthyroidism    Insomnia    Joint pain    fingers   Joint swelling    Pneumonia    x 2 ;in Dec 2010/2011   PONV (postoperative nausea and vomiting)    Right leg weakness     Past Surgical History:  Procedure Laterality Date   ABDOMINAL HYSTERECTOMY     at age 36   BACK SURGERY  20+yrs ago   CATARACT EXTRACTION W/PHACO Left 01/04/2019   Procedure: CATARACT EXTRACTION PHACO AND INTRAOCULAR LENS PLACEMENT ([place])  LEFT;  Surgeon: Leandrew Koyanagi, MD;  Location: [place];  Service: Ophthalmology;  Laterality: Left;  GIVE IV ZOFRAN   CATARACT EXTRACTION W/PHACO  Right 02/01/2019  Procedure: CATARACT EXTRACTION PHACO AND INTRAOCULAR LENS PLACEMENT ([place])  RIGHT DIABETIC;  Surgeon: Leandrew Koyanagi, MD;  Location: [place];  Service: Ophthalmology;  Laterality: Right;  DIABETIC GIVE IV ZOFRAN   COLONOSCOPY     COLONOSCOPY N/A 05/13/2015   Procedure: COLONOSCOPY;  Surgeon: Hulen Luster, MD;  [facility] ENDOSCOPY;  Service: Gastroenterology;  Laterality: N/A;   COLONOSCOPY WITH PROPOFOL N/A 05/10/2020   Procedure: COLONOSCOPY WITH PROPOFOL;  Surgeon: Virgel Manifold, MD;  Location: [facility] ENDOSCOPY;  Service: Endoscopy;  Laterality: N/A;   ESOPHAGOGASTRODUODENOSCOPY  2011   ESOPHAGOGASTRODUODENOSCOPY (EGD) WITH PROPOFOL N/A 09/02/2017   Procedure: ESOPHAGOGASTRODUODENOSCOPY (EGD) WITH PROPOFOL;  Surgeon: Virgel Manifold, MD;  Location: [facility] ENDOSCOPY;  Service: Endoscopy;  Laterality: N/A;   FOOT SURGERY  2011   bunionectomy and knot removed from bottom of both feet   JOINT REPLACEMENT Left 2014   hip  Kraskinski   LUMBAR LAMINECTOMY/DECOMPRESSION MICRODISCECTOMY  09/10/2011   Procedure: LUMBAR LAMINECTOMY/DECOMPRESSION MICRODISCECTOMY 1 LEVEL;  Surgeon: Hosie Spangle, MD;  Location: [place] NEURO ORS;  Service: Neurosurgery;  Laterality: Right;  RIGHT Lumbar  Laminotomy and microdiskectomy Lumbar Four-Five   LUMBAR LAMINECTOMY/DECOMPRESSION MICRODISCECTOMY N/A 06/26/2015   Procedure: LUMBAR LAMINECTOMY/DECOMPRESSION MICRODISCECTOMY 1 LEVEL;  Surgeon: Jovita Gamma, MD;  Location: [place] NEURO ORS;  Service: Neurosurgery;  Laterality: N/A;  L3 and L4 Laminectomies   MELANOMA EXCISION  2011   on nose/back on neck   THORACIC DISC SURGERY  at age 7 and 29    mass of veins that had gotten into muscle and wrapped around rib;3 ribs also removed from left side   TONSILLECTOMY  as a child   and adenoids    TOTAL HIP ARTHROPLASTY Left 2014   Dr. Mack Guise   TUBAL LIGATION     VAGINA SURGERY     vaginal wall ruptured     Family History   Problem Relation Age of Onset   Heart disease Brother        valve replacement   COPD Brother    COPD Mother        Emphysema   Diabetes Mother    Other Mother        Died in house fire at age 21.   Heart disease Father    Seizures Father    Cancer Father        Lung cancer with brain metastasis   Asthma Brother    COPD Son 7   Cancer Other        aunt ? maternal vs paternal side lung cancer    Diabetes Maternal Grandmother    Anesthesia problems Neg Hx    Hypotension Neg Hx    Malignant hyperthermia Neg Hx    Pseudochol deficiency Neg Hx    Thyroid disease Neg Hx    Breast cancer Neg Hx     Social History:  reports that she quit smoking about 4 years ago. Her smoking use included cigarettes. She has a 5.00 pack-year smoking history. She has never used smokeless tobacco. She reports that she does not drink alcohol and does not use drugs.  Allergies:  Allergies  Allergen Reactions   Vicodin [Hydrocodone-Acetaminophen] Other (See Comments)    Insomnia    Acetaminophen     Other reaction(s): Insomnia   Vicodin Hp [Hydrocodone-Acetaminophen]      Review of Systems  She is on basal insulin with Levemir and Metformin for diabetes and followed by PCP A1c as follows   Lab Results  Component Value Date   HGBA1C 6.6 (H) 03/20/2021   HGBA1C 7.3 (H) 12/16/2020   HGBA1C 7.8 (H) 09/12/2020   Lab Results  Component Value Date   MICROALBUR 1.8 02/20/2020   LDLCALC 43 03/20/2021   CREATININE 0.87 03/24/2021      Examination:   BP 138/70   Pulse 70   Ht 5' 4.5" (1.638 m)   Wt 156 lb 6.4 oz (70.9 kg)   SpO2 99%   BMI 26.43 kg/m   No mass felt in the neck Biceps reflexes difficult to elicit but appear normal    Assessment/Plan:  Post ablative hypothyroidism:  She has had I-131 treatment on 01/02/2020 and subsequently became hypothyroid in 04/2020  Subjectively she is doing well with no unusual fatigue Has been taking her levothyroxine 112 mcg consistently  every morning before eating as prescribed  TSH is consistently normal  She will now be able to come back annually for follow-up for her thyroid since TSH is very stable and likely is on adequate replacement Reminded her that she will need to be on thyroid supplements lifelong  She will ask her PCP if she can be followed at the PCP office for hypothyroidism instead of coming here  Elayne Snare 05/22/2021, 1:12 PM    Note: This office note was prepared with Dragon voice recognition system technology. Any transcriptional errors that result from this process are unintentional.

## 2021-05-30 ENCOUNTER — Other Ambulatory Visit: Payer: Self-pay | Admitting: Internal Medicine

## 2021-05-31 ENCOUNTER — Other Ambulatory Visit: Payer: Self-pay | Admitting: Internal Medicine

## 2021-06-03 ENCOUNTER — Other Ambulatory Visit: Payer: Self-pay | Admitting: Internal Medicine

## 2021-06-06 ENCOUNTER — Other Ambulatory Visit: Payer: Self-pay | Admitting: Internal Medicine

## 2021-06-06 NOTE — Telephone Encounter (Signed)
RX Refill: Tramadol Last Seen: 02-14-21 Last Ordered: 12-17-20 Next Appt: 08-18-21

## 2021-07-08 ENCOUNTER — Other Ambulatory Visit: Payer: Self-pay | Admitting: Internal Medicine

## 2021-07-25 ENCOUNTER — Telehealth: Payer: Self-pay | Admitting: Internal Medicine

## 2021-07-25 NOTE — Telephone Encounter (Signed)
Patient called in stated has pain in right shoulder and pain going up and  down causing numbness of right hand and pain up right side of neck transferred to access nurse

## 2021-07-28 NOTE — Telephone Encounter (Signed)
Patient will keep FU with PCP on 08/18/21 but will go to [facility] after 1 for right shoulder pain rated at 10 with movement.  Pain rated 6 without movement hurts to raise over head.

## 2021-08-04 DIAGNOSIS — M7581 Other shoulder lesions, right shoulder: Secondary | ICD-10-CM | POA: Insufficient documentation

## 2021-08-04 DIAGNOSIS — M19011 Primary osteoarthritis, right shoulder: Secondary | ICD-10-CM | POA: Insufficient documentation

## 2021-08-04 DIAGNOSIS — M778 Other enthesopathies, not elsewhere classified: Secondary | ICD-10-CM | POA: Insufficient documentation

## 2021-08-04 DIAGNOSIS — M7521 Bicipital tendinitis, right shoulder: Secondary | ICD-10-CM

## 2021-08-04 HISTORY — DX: Bicipital tendinitis, right shoulder: M75.21

## 2021-08-18 ENCOUNTER — Other Ambulatory Visit: Payer: Self-pay | Admitting: Internal Medicine

## 2021-08-18 ENCOUNTER — Ambulatory Visit: Payer: Medicare HMO | Admitting: Internal Medicine

## 2021-08-18 ENCOUNTER — Encounter: Payer: Self-pay | Admitting: Internal Medicine

## 2021-08-18 ENCOUNTER — Other Ambulatory Visit: Payer: Self-pay

## 2021-08-18 VITALS — BP 152/76 | HR 68 | Temp 97.9°F | Ht 64.5 in | Wt 163.2 lb

## 2021-08-18 DIAGNOSIS — I10 Essential (primary) hypertension: Secondary | ICD-10-CM | POA: Diagnosis not present

## 2021-08-18 DIAGNOSIS — Z794 Long term (current) use of insulin: Secondary | ICD-10-CM

## 2021-08-18 DIAGNOSIS — M25511 Pain in right shoulder: Secondary | ICD-10-CM

## 2021-08-18 DIAGNOSIS — E1140 Type 2 diabetes mellitus with diabetic neuropathy, unspecified: Secondary | ICD-10-CM

## 2021-08-18 DIAGNOSIS — E781 Pure hyperglyceridemia: Secondary | ICD-10-CM

## 2021-08-18 DIAGNOSIS — R748 Abnormal levels of other serum enzymes: Secondary | ICD-10-CM

## 2021-08-18 DIAGNOSIS — G8929 Other chronic pain: Secondary | ICD-10-CM

## 2021-08-18 LAB — HEMOGLOBIN A1C: Hgb A1c MFr Bld: 9.1 % — ABNORMAL HIGH (ref 4.6–6.5)

## 2021-08-18 MED ORDER — TRAMADOL HCL 50 MG PO TABS: ORAL_TABLET | ORAL | 5 refills | Status: DC

## 2021-08-18 MED ORDER — OZEMPIC (0.25 OR 0.5 MG/DOSE) 2 MG/1.5ML ~~LOC~~ SOPN: 0.2500 mg | PEN_INJECTOR | SUBCUTANEOUS | 1 refills | Status: DC

## 2021-08-18 MED ORDER — LEVEMIR FLEXTOUCH 100 UNIT/ML ~~LOC~~ SOPN: 20.0000 [IU] | PEN_INJECTOR | Freq: Every day | SUBCUTANEOUS | 2 refills | Status: DC

## 2021-08-18 NOTE — Assessment & Plan Note (Signed)
Elevated today,  But she is in pain.   No changes to regimen today

## 2021-08-18 NOTE — Assessment & Plan Note (Addendum)
Neuropathy is worsening . She has two healing blisters on great toe and 2nd toe that were asymptomatic.  Home glycemic control has been lost.  increase levemir to 20 units daily and resume use of ozempic starting with 0.25 mg weekly .   Referring to Nursing for instruction on use of Freestyle Libre 2  CBG monitor   Lab Results  Component Value Date   HGBA1C 6.6 (H) 03/20/2021

## 2021-08-18 NOTE — Assessment & Plan Note (Signed)
Recurrent intermittent elevations.  U/S of liver in 2019 was normal.  Occurs when thyroid is hyperactive   Lab Results  Component Value Date   ALT 10 03/20/2021   AST 13 03/20/2021   ALKPHOS 95 03/20/2021   BILITOT 0.4 03/20/2021     Lab Results  Component Value Date   ALT 10 03/20/2021   AST 13 03/20/2021   ALKPHOS 95 03/20/2021   BILITOT 0.4 03/20/2021

## 2021-08-18 NOTE — Assessment & Plan Note (Signed)
Seeing [facility] for treatment.  Advised not to use meloxican and ibuprofen.  Continue tramadol

## 2021-08-18 NOTE — Progress Notes (Signed)
Subjective:  Patient ID: Maria Hinton, female    DOB: 01-07-1946  Age: 76 y.o. MRN: [id number]  CC: The primary encounter diagnosis was HTN (hypertension), benign. Diagnoses of Type 2 diabetes mellitus with diabetic neuropathy, with long-term current use of insulin ([place]), Pure hypertriglyceridemia, Chronic right shoulder pain, and Elevated liver enzymes were also pertinent to this visit.   This visit occurred during the SARS-CoV-2 public health emergency.  Safety protocols were in place, including screening questions prior to the visit, additional usage of staff PPE, and extensive cleaning of exam room while observing appropriate contact time as indicated for disinfecting solutions.    HPI Maria Hinton presents for  Chief Complaint  Patient presents with   Follow-up    6 month follow up on diabetes, hypothyroidism, and hypertension    T2DM:  She  feels generally well,  But is not  exercising regularly  due to multiple problems with her right shoulder and ongoing back pain.  She is not trying to lose weight.   BS have been from 153 to 250 fasting and  usually  >200 post prandially.  Denies any recent hypoglyemic events.  Taking Levemir 15 units and metformin,  i but ran out of [place] in January  .  Admits to eating poorly.   Has been eating a lot of tomato soup  (canned) and using whole milk with Special K "fruit and yogurt" cereal and coffee.  Has neuropathy, recently  discovered 2 asymptomatic blisters on left great toe ad 2nd toe  with no history of new shoes,  prolonged walking, or  other inciting event. Appetite is poor.      2) right shoulder pain : treated by [facility] for bursitis /tendonitis of right AC joint and shoulder on Jan 30 .  X rays were done,  but no joint injection was done.  Given exercises to do at home, but the exercises cause the shoulder to pop and lock up and pop .  Has follow up on Feb 20.  Using tramadol , advised to use ibuprofen 800 mg tid with food, not  helping.  No history of recent fall onto opposite shoulder.  Has pain with ADL's including washing and drying hair,  wiping genitals.  Cannot lift more than a few lbs. Puppy was neutered last Friday but only weighs 5 lbs.    3) recovering from a respiratory illness several weeks ago.  Fever to 101 , rhinorrhea   did not test for COVID and did not quarantine.  Dtr in law became ill a few days later from visiting her. She was COVID negative.  Patient cites persistent tired ,  occasional cough.  Fatigue complicated by new puppy who has been disrupting her sleep.    Outpatient Medications Prior to Visit  Medication Sig Dispense Refill   albuterol (PROVENTIL HFA;VENTOLIN HFA) 108 (90 Base) MCG/ACT inhaler Inhale 2 puffs into the lungs every 6 (six) hours as needed for wheezing. 6.7 g 11   aspirin EC 81 MG tablet Take 81 mg by mouth daily.     atenolol (TENORMIN) 50 MG tablet TAKE 1 TABLET BY MOUTH TWICE A DAY 180 tablet 1   atorvastatin (LIPITOR) 40 MG tablet TAKE 1 TABLET BY MOUTH DAILY AT 6 PM. 90 tablet 3   BD PEN NEEDLE NANO 2ND GEN 32G X 4 MM MISC USE DAILY AS DIRECTED WITH LEVEMIR 100 each 1   gabapentin (NEURONTIN) 300 MG capsule TAKE 1 CAPSULE BY MOUTH THREE TIMES  A DAY 90 capsule 1   glucose blood (ONETOUCH VERIO) test strip Use as instructed to check blood sugars 3 times daily 100 strip 12   levothyroxine (SYNTHROID) 112 MCG tablet Take 1 tablet (112 mcg total) by mouth daily. 90 tablet 1   losartan (COZAAR) 50 MG tablet TAKE 1 TABLET BY MOUTH EVERY DAY 90 tablet 3   meloxicam (MOBIC) 7.5 MG tablet TAKE 1 TABLET BY MOUTH EVERY DAY 30 tablet 2   metFORMIN (GLUCOPHAGE) 1000 MG tablet TAKE 1 TABLET BY MOUTH 2 TIMES DAILY WITH A MEAL. 180 tablet 1   omeprazole (PRILOSEC) 40 MG capsule TAKE 1 CAPSULE BY MOUTH EVERY DAY 90 capsule 1   OneTouch Delica Lancets 14E MISC 1 application by Does not apply route 2 (two) times daily. Use to check blood sugar up to two times daily. E11.69 100 each 11    temazepam (RESTORIL) 15 MG capsule Take 1 capsule (15 mg total) by mouth at bedtime as needed for sleep. 30 capsule 5   LEVEMIR FLEXTOUCH 100 UNIT/ML FlexPen INJECT 15 UNITS INTO THE SKIN DAILY AT 10 PM. 15 mL 2   Semaglutide,0.25 or 0.5MG/DOS, (OZEMPIC, 0.25 OR 0.5 MG/DOSE,) 2 MG/1.5ML SOPN Inject 0.5 mg into the skin once a week. 1.5 mL 1   traMADol (ULTRAM) 50 MG tablet TAKE 1 TABLET BY MOUTH EVERY 6 HOURS AS NEEDED FOR SPINAL STENOSIS PAIN 120 tablet 2   No facility-administered medications prior to visit.    Review of Systems;  Patient denies headache, fevers, malaise, unintentional weight loss, skin rash, eye pain, sinus congestion and sinus pain, sore throat, dysphagia,  hemoptysis , cough, dyspnea, wheezing, chest pain, palpitations, orthopnea, edema, abdominal pain, nausea, melena, diarrhea, constipation, flank pain, dysuria, hematuria, urinary  Frequency, nocturia, numbness, tingling, seizures,  Focal weakness, Loss of consciousness,  Tremor, insomnia, depression, anxiety, and suicidal ideation.      Objective:  BP (!) 152/76 (BP Location: Left Arm, Patient Position: Sitting, Cuff Size: Normal)    Pulse 68    Temp 97.9 F (36.6 C) (Oral)    Ht 5' 4.5" (1.638 m)    Wt 163 lb 3.2 oz (74 kg)    SpO2 96%    BMI 27.58 kg/m   BP Readings from Last 3 Encounters:  08/18/21 (!) 152/76  05/22/21 138/70  02/14/21 (!) 142/84    Wt Readings from Last 3 Encounters:  08/18/21 163 lb 3.2 oz (74 kg)  05/22/21 156 lb 6.4 oz (70.9 kg)  02/14/21 151 lb 12.8 oz (68.9 kg)    General appearance: alert, cooperative and appears stated age Ears: normal TM's and external ear canals both ears Throat: lips, mucosa, and tongue normal; teeth and gums normal Neck: no adenopathy, no carotid bruit, supple, symmetrical, trachea midline and thyroid not enlarged, symmetric, no tenderness/mass/nodules Back: symmetric, no curvature. ROM normal. No CVA tenderness. Lungs: clear to auscultation  bilaterally Heart: regular rate and rhythm, S1, S2 normal, no murmur, click, rub or gallop Abdomen: soft, non-tender; bowel sounds normal; no masses,  no organomegaly Pulses: 2+ and symmetric Skin: Skin color, texture, turgor normal. No rashes or lesions Lymph nodes: Cervical, supraclavicular, and axillary nodes normal.  Lab Results  Component Value Date   HGBA1C 6.6 (H) 03/20/2021   HGBA1C 7.3 (H) 12/16/2020   HGBA1C 7.8 (H) 09/12/2020    Lab Results  Component Value Date   CREATININE 0.87 03/24/2021   CREATININE 0.77 03/20/2021   CREATININE 0.79 12/16/2020    Lab Results  Component  Value Date   WBC 6.4 04/21/2018   HGB 14.5 04/21/2018   HCT 42.7 04/21/2018   PLT 324.0 04/21/2018   GLUCOSE 124 (H) 03/24/2021   CHOL 111 03/20/2021   TRIG 115.0 03/20/2021   HDL 45.10 03/20/2021   LDLDIRECT 67.0 02/20/2020   LDLCALC 43 03/20/2021   ALT 10 03/20/2021   AST 13 03/20/2021   NA 137 03/24/2021   K 5.1 03/24/2021   CL 100 03/24/2021   CREATININE 0.87 03/24/2021   BUN 10 03/24/2021   CO2 28 03/24/2021   TSH 2.70 05/14/2021   INR 0.88 02/17/2018   HGBA1C 6.6 (H) 03/20/2021   MICROALBUR 1.8 02/20/2020    MM 3D SCREEN BREAST BILATERAL  Result Date: 09/24/2020 CLINICAL DATA:  Screening. EXAM: DIGITAL SCREENING BILATERAL MAMMOGRAM WITH TOMOSYNTHESIS AND CAD TECHNIQUE: Bilateral screening digital craniocaudal and mediolateral oblique mammograms were obtained. Bilateral screening digital breast tomosynthesis was performed. The images were evaluated with computer-aided detection. COMPARISON:  Previous exam(s). ACR Breast Density Category b: There are scattered areas of fibroglandular density. FINDINGS: There are no findings suspicious for malignancy. The images were evaluated with computer-aided detection. IMPRESSION: No mammographic evidence of malignancy. A result letter of this screening mammogram will be mailed directly to the patient. RECOMMENDATION: Screening mammogram in one  year. (Code:SM-B-01Y) BI-RADS CATEGORY  1: Negative. Electronically Signed   By: Claudie Revering M.D.   On: 09/24/2020 16:05    Assessment & Plan:   Problem List Items Addressed This Visit     Chronic right shoulder pain    Seeing [facility] for treatment.  Advised not to use meloxican and ibuprofen.  Continue tramadol      Relevant Medications   traMADol (ULTRAM) 50 MG tablet   Elevated liver enzymes    Recurrent intermittent elevations.  U/S of liver in 2019 was normal.  Occurs when thyroid is hyperactive   Lab Results  Component Value Date   ALT 10 03/20/2021   AST 13 03/20/2021   ALKPHOS 95 03/20/2021   BILITOT 0.4 03/20/2021     Lab Results  Component Value Date   ALT 10 03/20/2021   AST 13 03/20/2021   ALKPHOS 95 03/20/2021   BILITOT 0.4 03/20/2021         HLD (hyperlipidemia)   Relevant Orders   Lipid Profile   HTN (hypertension), benign - Primary    Elevated today,  But she is in pain.   No changes to regimen today      Relevant Orders   Comp Met (CMET)   Type 2 diabetes mellitus with diabetic neuropathy, unspecified (HCC)    Neuropathy is worsening . She has two healing blisters on great toe and 2nd toe that were asymptomatic.  Home glycemic control has been lost.  increase levemir to 20 units daily and resume use of ozempic starting with 0.25 mg weekly .   Lab Results  Component Value Date   HGBA1C 6.6 (H) 03/20/2021         Relevant Medications   Semaglutide,0.25 or 0.5MG/DOS, (OZEMPIC, 0.25 OR 0.5 MG/DOSE,) 2 MG/1.5ML SOPN   insulin detemir (LEVEMIR FLEXTOUCH) 100 UNIT/ML FlexPen   Other Relevant Orders   Comp Met (CMET)   Hemoglobin A1c    I spent 30 minutes dedicated to the care of this patient on the date of this encounter to include pre-visit review of patient's medical history,  most recent imaging studies, Face-to-face time with the patient , and post visit ordering of testing and therapeutics.  Follow-up: Return in about 3 months  (around 11/15/2021) for follow up diabetes.   Crecencio Mc, MD

## 2021-08-18 NOTE — Patient Instructions (Addendum)
For your diabetes:  INCREASE LEVEMIR BY 5 UNITS IMMEDIATELY  I am  recommending restarting Ozempic  to help you lose weight  and gain control of your diabetes    The dose for the first 4 weekly doses is 0.25 mg.  This is marked on the pen  You may have mild nausea on the first or second day but this should resolve.  If not  ,  stop the medication.   As long as you are losing weight,  you can continue the dose you are on .  Only increase the dose to 0.5 mg after 4 weeks if your weight has plateaued.  Let me know when you need a refill and what dose you are taking.    We can increase the dose every 4 weeks if you are tolerating the previous dose without nausea   Do not combine meloxicam with ibuprofen.  Choose one or the other

## 2021-08-19 LAB — LDL CHOLESTEROL, DIRECT: Direct LDL: 105 mg/dL

## 2021-08-19 LAB — LIPID PANEL
Cholesterol: 192 mg/dL (ref 0–200)
HDL: 56.7 mg/dL (ref >39.00)
NonHDL: 135.26
Total CHOL/HDL Ratio: 3
Triglycerides: 276 mg/dL — ABNORMAL HIGH (ref 0.0–149.0)
VLDL: 55.2 mg/dL — ABNORMAL HIGH (ref 0.0–40.0)

## 2021-08-19 LAB — COMPREHENSIVE METABOLIC PANEL
ALT: 14 U/L (ref 0–35)
AST: 18 U/L (ref 0–37)
Albumin: 4.4 g/dL (ref 3.5–5.2)
Alkaline Phosphatase: 117 U/L (ref 39–117)
BUN: 19 mg/dL (ref 6–23)
CO2: 29 mEq/L (ref 19–32)
Calcium: 9.3 mg/dL (ref 8.4–10.5)
Chloride: 101 mEq/L (ref 96–112)
Creatinine, Ser: 1 mg/dL (ref 0.40–1.20)
GFR: 55.89 mL/min — ABNORMAL LOW (ref >60.00)
Glucose, Bld: 189 mg/dL — ABNORMAL HIGH (ref 70–99)
Potassium: 4.9 mEq/L (ref 3.5–5.1)
Sodium: 138 mEq/L (ref 135–145)
Total Bilirubin: 0.5 mg/dL (ref 0.2–1.2)
Total Protein: 6.9 g/dL (ref 6.0–8.3)

## 2021-08-20 ENCOUNTER — Other Ambulatory Visit: Payer: Self-pay | Admitting: Internal Medicine

## 2021-08-20 ENCOUNTER — Telehealth: Payer: Self-pay

## 2021-08-20 NOTE — Telephone Encounter (Signed)
-----   Message from Crecencio Mc, MD sent at 08/20/2021  7:15 AM EST ----- Labs confirm that the diabetes is out of control.  I would like to have you meet with  our clinical pharmacist,  Catie Darnelle Maffucci  to work on control of blood sugars and will make that referral if you agree?  She will contact you by phone

## 2021-08-21 ENCOUNTER — Other Ambulatory Visit: Payer: Self-pay | Admitting: Internal Medicine

## 2021-08-21 NOTE — Telephone Encounter (Signed)
LOV: 08/18/2021

## 2021-08-22 ENCOUNTER — Other Ambulatory Visit: Payer: Self-pay | Admitting: Internal Medicine

## 2021-08-22 MED ORDER — "INSULIN SYRINGE 31G X 5/16"" 1 ML MISC": 1 refills | Status: DC

## 2021-08-25 ENCOUNTER — Other Ambulatory Visit: Payer: Self-pay | Admitting: Internal Medicine

## 2021-08-25 ENCOUNTER — Encounter: Payer: Self-pay | Admitting: Internal Medicine

## 2021-08-25 DIAGNOSIS — E1140 Type 2 diabetes mellitus with diabetic neuropathy, unspecified: Secondary | ICD-10-CM

## 2021-08-25 MED ORDER — METFORMIN HCL 1000 MG PO TABS: 1000.0000 mg | ORAL_TABLET | Freq: Two times a day (BID) | ORAL | 1 refills | Status: DC

## 2021-08-27 ENCOUNTER — Telehealth: Payer: Self-pay

## 2021-08-27 NOTE — Chronic Care Management (AMB) (Signed)
Chronic Care Management   Note  08/27/2021 Name: Maria Hinton MRN: [id number] DOB: [birth year]  Maria Hinton is a 76 y.o. year old female who is a primary care patient of Derrel Nip, Aris Everts, MD. I reached out to Lajuan Lines by phone today in response to a referral sent by [name] PCP.  [name] including:  CCM service includes personalized support from designated clinical staff supervised by her physician, including individualized plan of care and coordination with other care providers 24/7 contact phone numbers for assistance for urgent and routine care needs. Service will only be billed when office clinical staff spend 20 minutes or more in a month to coordinate care. Only one practitioner may furnish and bill the service in a calendar month. The patient may stop CCM services at any time (effective at the end of the month) by phone call to the office staff. The patient is responsible for co-pay (up to 20% after annual deductible is met) if co-pay is required by the individual health plan.   Patient agreed to services and verbal consent obtained.   Follow up plan: Telephone appointment with care management team member scheduled for:09/03/2021  Noreene Larsson, [place], [place], [place] [address] Direct Dial: [phone] [email or web address] Website: [email or web address]

## 2021-08-28 ENCOUNTER — Other Ambulatory Visit: Payer: Self-pay | Admitting: Internal Medicine

## 2021-09-03 ENCOUNTER — Telehealth: Payer: Self-pay | Admitting: Pharmacist

## 2021-09-03 ENCOUNTER — Ambulatory Visit (INDEPENDENT_AMBULATORY_CARE_PROVIDER_SITE_OTHER): Payer: Medicare HMO | Admitting: Pharmacist

## 2021-09-03 DIAGNOSIS — I10 Essential (primary) hypertension: Secondary | ICD-10-CM

## 2021-09-03 DIAGNOSIS — E1140 Type 2 diabetes mellitus with diabetic neuropathy, unspecified: Secondary | ICD-10-CM

## 2021-09-03 DIAGNOSIS — Z794 Long term (current) use of insulin: Secondary | ICD-10-CM

## 2021-09-03 DIAGNOSIS — E7800 Pure hypercholesterolemia, unspecified: Secondary | ICD-10-CM

## 2021-09-03 MED ORDER — OZEMPIC (0.25 OR 0.5 MG/DOSE) 2 MG/1.5ML ~~LOC~~ SOPN: 0.5000 mg | PEN_INJECTOR | SUBCUTANEOUS | 2 refills | Status: DC

## 2021-09-03 MED ORDER — LEVEMIR FLEXPEN 100 UNIT/ML ~~LOC~~ SOPN: 16.0000 [IU] | PEN_INJECTOR | Freq: Every day | SUBCUTANEOUS | 1 refills | Status: DC

## 2021-09-03 NOTE — Chronic Care Management (AMB) (Signed)
Chronic Care Management CCM Pharmacy Note  09/03/2021 Name:  Maria Hinton MRN:  [id number] DOB:  [birth year]  Summary: - Tolerating Ozempic. Glucose improved  Recommendations/Changes made from today's visit: - Increased Ozempic to 0.5 mg weekly. Decrease Levemir to 16 units daily.  - Discussed home BP readings.  - Pursuing coverage for [place] 2 CGM  Subjective: Maria Hinton is an 76 y.o. year old female who is a primary patient of T[place]o, Aris Everts, MD.  The CCM team was consulted for assistance with disease management and care coordination needs.    Engaged with patient by telephone for initial visit for pharmacy case management and/or care coordination services.   Objective:  Medications Reviewed Today     Reviewed by De Hollingshead, RPH-CPP (Pharmacist) on 09/03/21 at 1018  Med List Status: <None>   Medication Order Taking? Sig Documenting Provider Last Dose Status Informant  albuterol (PROVENTIL HFA;VENTOLIN HFA) 108 (90 Base) MCG/ACT inhaler [id number] Yes Inhale 2 puffs into the lungs every 6 (six) hours as needed for wheezing. Laverle Hobby, MD Taking Active Other  aspirin EC 81 MG tablet [id number] Yes Take 81 mg by mouth daily. [provider] Taking Active Self  atenolol (TENORMIN) 50 MG tablet [id number] Yes TAKE 1 TABLET BY MOUTH TWICE A DAY Crecencio Mc, MD Taking Active   atorvastatin (LIPITOR) 40 MG tablet [id number] Yes TAKE 1 TABLET BY MOUTH DAILY AT 6 PM. Crecencio Mc, MD Taking Active   BD PEN NEEDLE NANO 2ND GEN 32G X 4 MM MISC [id number] Yes USE DAILY AS DIRECTED WITH LEVEMIR Crecencio Mc, MD Taking Active   gabapentin (NEURONTIN) 300 MG capsule [id number] Yes TAKE 1 CAPSULE BY MOUTH THREE TIMES A DAY Crecencio Mc, MD Taking Active   glucose blood (ONETOUCH VERIO) test strip [id number] Yes Use as instructed to check blood sugars 3 times daily Crecencio Mc, MD Taking Active   insulin detemir (LEVEMIR) 100 UNIT/ML injection [id number]  Yes INJECT 20 UNITS INTO THE SKIN DAILY Crecencio Mc, MD Taking Active   Insulin Syringe-Needle U-100 (INSULIN SYRINGE 1CC/31GX5/16") 31G X 5/16" 1 ML MISC [id number] Yes USE DAILY TO ADMINISTER Mauri Brooklyn, MD Taking Active   levothyroxine (SYNTHROID) 112 MCG tablet [id number] Yes Take 1 tablet (112 mcg total) by mouth daily. Elayne Snare, MD Taking Active   losartan (COZAAR) 50 MG tablet [id number] Yes TAKE 1 TABLET BY MOUTH EVERY DAY Crecencio Mc, MD Taking Active   meloxicam (MOBIC) 7.5 MG tablet [id number] Yes TAKE 1 TABLET BY MOUTH EVERY DAY Crecencio Mc, MD Taking Active   metFORMIN (GLUCOPHAGE) 1000 MG tablet [id number] Yes Take 1 tablet (1,000 mg total) by mouth 2 (two) times daily with a meal. Crecencio Mc, MD Taking Active   omeprazole (PRILOSEC) 40 MG capsule [id number] Yes TAKE 1 CAPSULE BY MOUTH EVERY DAY Crecencio Mc, MD Taking Active   OneTouch Delica Lancets 94B MISC [id number] Yes 1 application by Does not apply route 2 (two) times daily. Use to check blood sugar up to two times daily. E11.69 Crecencio Mc, MD Taking Active   Semaglutide,0.25 or 0.5MG /DOS, (OZEMPIC, 0.25 OR 0.5 MG/DOSE,) 2 MG/1.5ML SOPN [id number] Yes Inject 0.25 mg into the skin once a week. For 4 weeks.  Then increase dose to 0.5 mg weekly Crecencio Mc, MD Taking Active   temazepam (RESTORIL) 15 MG capsule [id number] Yes TAKE 1 CAPSULE BY MOUTH AT BEDTIME AS NEEDED FOR SLEEP. Deborra Medina  L, MD Taking Active   traMADol (ULTRAM) 50 MG tablet [id number] Yes TAKE 1 TABLET BY MOUTH EVERY 6 HOURS AS NEEDED FOR musculoskeletal  PAIN Crecencio Mc, MD Taking Active   vitamin B-12 (CYANOCOBALAMIN) 500 MCG tablet [id number] Yes Take 500 mcg by mouth daily. [provider] Taking Active             Pertinent Labs:   Lab Results  Component Value Date   HGBA1C 9.1 (H) 08/18/2021   Lab Results  Component Value Date   CHOL 192 08/18/2021   HDL 56.70 08/18/2021   LDLCALC 43 03/20/2021    LDLDIRECT 105.0 08/18/2021   TRIG 276.0 (H) 08/18/2021   CHOLHDL 3 08/18/2021   Lab Results  Component Value Date   CREATININE 1.00 08/18/2021   BUN 19 08/18/2021   NA 138 08/18/2021   K 4.9 08/18/2021   CL 101 08/18/2021   CO2 29 08/18/2021    SDOH:  (Social Determinants of Health) assessments and interventions performed:  SDOH Interventions    Flowsheet Row Most Recent Value  SDOH Interventions   Financial Strain Interventions Intervention Not Indicated       CCM Care Plan  Review of patient past medical history, allergies, medications, health status, including review of consultants reports, laboratory and other test data, was performed as part of comprehensive evaluation and provision of chronic care management services.   Care Plan : Medication Management  Updates made by De Hollingshead, RPH-CPP since 09/03/2021 12:00 AM     Problem: Diabetes, Hyperlipidemia, CKD      Long-Range Goal: Disease Progression Prevention   Start Date: 09/03/2021  This Visit's Progress: On track  Priority: High  Note:   Current Barriers:  Unable to achieve control of diabetes   Pharmacist Clinical Goal(s):  patient will achieve control of diabetes as evidenced by A1c  through collaboration with PharmD and provider.    Interventions: 1:1 collaboration with Crecencio Mc, MD regarding development and update of comprehensive plan of care as evidenced by provider attestation and co-signature Inter-disciplinary care team collaboration (see longitudinal plan of care) Comprehensive medication review performed; medication list updated in electronic medical record  Health Maintenance   Yearly diabetic eye exam: up to date Yearly diabetic foot exam: due Urine microalbumin: up to date Yearly influenza vaccination: up to date Td/Tdap vaccination: up to date Pneumonia vaccination: up to date COVID vaccinations: up to date Shingrix vaccinations: due - recommended today Colonoscopy:  up to date Bone density scan: up to date Mammogram: up to date  Diabetes:  Uncontrolled; current treatment: metformin 1000 mg twice daily, Ozempic 0.25 mg weekly x 2 weeks, Levemir 20 units QPM;  Current glucose readings: fasting glucose: 110-130s lately, post prandial glucose: not checking Current meal patterns: breakfast: special K cereal (fruit and yogurt); lunch: generally skips; dinner: 1/2 can of tomato soup and crackers; snacks: blackberries, raspberries, cucumbers, chocolate wafer bars; sometimes tangerines;  Does report some lows periodically in the AM, will treat with orange juice or chocolate candy Discussed CGM. Patient interested. Will send order to [facility] via [facility] portal.  Given tolerability, will go ahead and increase Ozempic to 0.5 mg weekly. Decrease Levemir to 16 units daily. Re-sending for pen instead of vial. Continue metformin 1000 mg twice daily.  Educate on benefit of reducing carbohydrate intake and increasing protein intake.  Hypertension:   Uncontrolled at last office visit; current treatment: atenolol 50 mg twice daily, losartan 50 mg daily  Current home  readings: has a cuff, but is not checking Educated on benefit of home BP readings. Encouraged to check periodically, write down readings, and bring back for follow up.   Hyperlipidemia:   Uncontrolled; current treatment: atorvastatin 40 mg daily - though had been off for a few weeks when she was sick;  Current antiplatelet regimen: aspirin 81 Recommended to continue current regimen at this time  Osteoarthritis: Relatively well controlled; current regimen: tramadol 50 mg QPM, gabapentin 300 mg TID, meloxicam 7.5 mg daily Recommended to continue current regimen at this time  Insomnia: Moderately well controlled; current regimen: temazepam 50 mg QPM Recommended to continue current regimen at this time  Hypothyroidism: Controlled per last TSH; current regimen: levothyroxine 112 mcg  daily Recommended to continue current regimen at this time  Patient Goals/Self-Care Activities patient will:  - take medications as prescribed as evidenced by patient report and record review check glucose twice daily, document, and provide at future appointments check blood pressure periodically, document, and provide at future appointments       Plan: Telephone follow up appointment with care management team member scheduled for:  6 weeks  Catie Darnelle Maffucci, PharmD, [place], [place] Clinical Pharmacist [employer] at [employer] [phone]

## 2021-09-03 NOTE — Addendum Note (Signed)
Addended by: Adair Laundry on: 09/03/2021 10:49 AM ? ? Modules accepted: Orders ? ?

## 2021-09-03 NOTE — Telephone Encounter (Signed)
Patient interested in referral to podiatry related to diabetic neuropathy/fitting for diabetic shoes ?

## 2021-09-03 NOTE — Addendum Note (Signed)
Addended by: Crecencio Mc on: 09/03/2021 12:57 PM ? ? Modules accepted: Orders ? ?

## 2021-09-03 NOTE — Patient Instructions (Signed)
[place],  ? ?It was great talking to you today! ? ?Increase Ozempic to 0.5 mg weekly. Decrease Levemir to 16 units daily. Continue metformin 1000 mg twice daily. Our goal will be to increase Ozempic to decrease/eventually get you off insulin.  ? ?I have sent the order for the Libre Continuous Glucose Monitor to [facility]. They will call you to confirm your insurance.  ? ?Work on incorporating proteins into your typical dietary patterns. ? ?Check your blood sugars twice daily:  ?1) Fasting, first thing in the morning before breakfast and  ?2) 2 hours after your largest meal.  ? ?For a goal A1c of less than 7%, goal fasting readings are less than 130 and goal 2 hour after meal readings are less than 180.  ?  ? ?Please check blood pressure periodically. Our goal is less than 130/80.  ? ?We recommend the Shingrix (shingles) vaccine series for all over age 3. It should have a $0 copay on all Medicare plans this year. You can pursue this without a prescription at your local pharmacy, or feel free to call our [place] at [facility] at [phone]. ? ? ?Take care! ? ?Catie Darnelle Maffucci, PharmD ? ?Visit Information  ? ?Following is a copy of your full care plan:  ?Care Plan : Medication Management  ?Updates made by De Hollingshead, RPH-CPP since 09/03/2021 12:00 AM  ?  ? ?Problem: Diabetes, Hyperlipidemia, CKD   ?  ? ?Long-Range Goal: Disease Progression Prevention   ?Start Date: 09/03/2021  ?This Visit's Progress: On track  ?Priority: High  ?Note:   ?Current Barriers:  ?Unable to achieve control of diabetes  ? ?Pharmacist Clinical Goal(s):  ?patient will achieve control of diabetes as evidenced by A1c  through collaboration with PharmD and provider.  ? ? ?Interventions: ?1:1 collaboration with Crecencio Mc, MD regarding development and update of comprehensive plan of care as evidenced by provider attestation and co-signature ?Inter-disciplinary care team collaboration (see longitudinal plan of  care) ?Comprehensive medication review performed; medication list updated in electronic medical record ? ?Health Maintenance   ?Yearly diabetic eye exam: up to date ?Yearly diabetic foot exam: due ?Urine microalbumin: up to date ?Yearly influenza vaccination: up to date ?Td/Tdap vaccination: up to date ?Pneumonia vaccination: up to date ?COVID vaccinations: up to date ?Shingrix vaccinations: due - recommended today ?Colonoscopy: up to date ?Bone density scan: up to date ?Mammogram: up to date ? ?Diabetes:  ?Uncontrolled; current treatment: metformin 1000 mg twice daily, Ozempic 0.25 mg weekly x 2 weeks, Levemir 20 units QPM;  ?Current glucose readings: fasting glucose: 110-130s lately, post prandial glucose: not checking ?Current meal patterns: breakfast: special K cereal (fruit and yogurt); lunch: generally skips; dinner: 1/2 can of tomato soup and crackers; snacks: blackberries, raspberries, cucumbers, chocolate wafer bars; sometimes tangerines;  ?Does report some lows periodically in the AM, will treat with orange juice or chocolate candy ?Discussed CGM. Patient interested. Will send order to [facility] via [facility] portal.  ?Given tolerability, will go ahead and increase Ozempic to 0.5 mg weekly. Decrease Levemir to 16 units daily. Re-sending for pen instead of vial. Continue metformin 1000 mg twice daily.  ?Educate on benefit of reducing carbohydrate intake and increasing protein intake. ? ?Hypertension:   ?Uncontrolled at last office visit; current treatment: atenolol 50 mg twice daily, losartan 50 mg daily  ?Current home readings: has a cuff, but is not checking ?Educated on benefit of home BP readings. Encouraged to check periodically, write down  readings, and bring back for follow up.  ? ?Hyperlipidemia:   ?Uncontrolled; current treatment: atorvastatin 40 mg daily - though had been off for a few weeks when she was sick;  ?Current antiplatelet regimen: aspirin 81 ?Recommended to  continue current regimen at this time ? ?Osteoarthritis: ?Relatively well controlled; current regimen: tramadol 50 mg QPM, gabapentin 300 mg TID, meloxicam 7.5 mg daily ?Recommended to continue current regimen at this time ? ?Insomnia: ?Moderately well controlled; current regimen: temazepam 50 mg QPM ?Recommended to continue current regimen at this time ? ?Hypothyroidism: ?Controlled per last TSH; current regimen: levothyroxine 112 mcg daily ?Recommended to continue current regimen at this time ? ?Patient Goals/Self-Care Activities ?patient will:  ?- take medications as prescribed as evidenced by patient report and record review ?check glucose twice daily, document, and provide at future appointments ?check blood pressure periodically, document, and provide at future appointments ? ?  ? ? ?Consent to CCM Services: ?Ms. [place] was given information about Chronic Care Management services including:  ?CCM service includes personalized support from designated clinical staff supervised by her physician, including individualized plan of care and coordination with other care providers ?24/7 contact phone numbers for assistance for urgent and routine care needs. ?Service will only be billed when office clinical staff spend 20 minutes or more in a month to coordinate care. ?Only one practitioner may furnish and bill the service in a calendar month. ?The patient may stop CCM services at any time (effective at the end of the month) by phone call to the office staff. ?The patient will be responsible for cost sharing (co-pay) of up to 20% of the service fee (after annual deductible is met). ? ?Patient agreed to services and verbal consent obtained.  ? ?Plan: Telephone follow up appointment with care management team member scheduled for:  6 weeks ? ?Catie Darnelle Maffucci, PharmD, BCACP, CPP ?Clinical Pharmacist ?Therapist, music at [employer] ?[phone] ? ? ?Please call the care guide team at [phone] if you need to  cancel or reschedule your appointment.  ? ?Patient verbalizes understanding of instructions and care plan provided today and agrees to view in [place]. Active MyChart status confirmed with patient.   ? ? ?

## 2021-09-03 NOTE — Telephone Encounter (Signed)
I have pended the referral.

## 2021-09-05 ENCOUNTER — Ambulatory Visit (INDEPENDENT_AMBULATORY_CARE_PROVIDER_SITE_OTHER): Payer: Medicare HMO

## 2021-09-05 VITALS — Ht 64.5 in | Wt 157.0 lb

## 2021-09-05 DIAGNOSIS — Z0000 Encounter for general adult medical examination without abnormal findings: Secondary | ICD-10-CM

## 2021-09-05 NOTE — Patient Instructions (Addendum)
?  Maria Hinton , ?Thank you for taking time to come for your Medicare Wellness Visit. I appreciate your ongoing commitment to your health goals. Please review the following plan we discussed and let me know if I can assist you in the future.  ? ?These are the goals we discussed: ? Goals   ? ?  ? Patient Stated  ?   DIET - INCREASE WATER INTAKE (pt-stated)   ?   Stay hydrated ?  ?   Increase physical activity (pt-stated)   ?   Walk more for exercise ?Decrease sugar intake ?  ?   Weight (lb) < 140 lb (63.5 kg) (pt-stated)   ?   Healthy diet ?Stay hydrated ?Stay active ?  ? ?  ?  ?This is a list of the screening recommended for you and due dates:  ?Health Maintenance  ?Topic Date Due  ? Complete foot exam   08/07/2021  ? COVID-19 Vaccine (4 - Booster for Pfizer series) 09/21/2021*  ? Zoster (Shingles) Vaccine (1 of 2) 12/06/2021*  ? Mammogram  09/23/2021  ? Eye exam for diabetics  12/23/2021  ? Hemoglobin A1C  02/15/2022  ? Colon Cancer Screening  05/11/2023  ? Tetanus Vaccine  02/10/2024  ? Pneumonia Vaccine  Completed  ? Flu Shot  Completed  ? DEXA scan (bone density measurement)  Completed  ? Hepatitis C Screening: USPSTF Recommendation to screen - Ages 25-79 yo.  Completed  ? HPV Vaccine  Aged Out  ?*Topic was postponed. The date shown is not the original due date.  ?  ? ?

## 2021-09-05 NOTE — Progress Notes (Addendum)
Subjective:   Maria Hinton is a 76 y.o. female who presents for Medicare Annual (Subsequent) preventive examination.  Review of Systems    No ROS.  Medicare Wellness Virtual Visit.  Visual/audio telehealth visit, UTA vital signs.   See social history for additional risk factors.   Cardiac Risk Factors include: advanced age (>72men, >4 women)     Objective:    Today's Vitals   09/05/21 0948  Weight: 157 lb (71.2 kg)  Height: 5' 4.5" (1.638 m)   Body mass index is 26.53 kg/m.  Advanced Directives 09/05/2021 09/04/2020 05/10/2020 03/07/2020 11/21/2019 09/04/2019 02/01/2019  Does Patient Have a Medical Advance Directive? Yes Yes Yes Yes Yes Yes Yes  Type of Paramedic of [place];Living will [place];Living will Living will [place];Living will [place];Living will [place];Living will Living will;Healthcare Power of Attorney  Does patient want to make changes to medical advance directive? No - Patient declined No - Patient declined - - - No - Patient declined No - Patient declined  Copy of [place] in Chart? No - copy requested No - copy requested - - - No - copy requested No - copy requested  Pre-existing out of facility DNR order (yellow form or pink MOST form) - - - - - - -    Current Medications (verified) Outpatient Encounter Medications as of 09/05/2021  Medication Sig   albuterol (PROVENTIL HFA;VENTOLIN HFA) 108 (90 Base) MCG/ACT inhaler Inhale 2 puffs into the lungs every 6 (six) hours as needed for wheezing.   aspirin EC 81 MG tablet Take 81 mg by mouth daily.   atenolol (TENORMIN) 50 MG tablet TAKE 1 TABLET BY MOUTH TWICE A DAY   atorvastatin (LIPITOR) 40 MG tablet TAKE 1 TABLET BY MOUTH DAILY AT 6 PM.   BD PEN NEEDLE NANO 2ND GEN 32G X 4 MM MISC USE DAILY AS DIRECTED WITH LEVEMIR   gabapentin (NEURONTIN) 300 MG capsule TAKE 1 CAPSULE BY MOUTH THREE TIMES  A DAY   glucose blood (ONETOUCH VERIO) test strip Use as instructed to check blood sugars 3 times daily   insulin detemir (LEVEMIR FLEXPEN) 100 UNIT/ML FlexPen Inject 16 Units into the skin daily.   levothyroxine (SYNTHROID) 112 MCG tablet Take 1 tablet (112 mcg total) by mouth daily.   losartan (COZAAR) 50 MG tablet TAKE 1 TABLET BY MOUTH EVERY DAY   meloxicam (MOBIC) 7.5 MG tablet TAKE 1 TABLET BY MOUTH EVERY DAY   metFORMIN (GLUCOPHAGE) 1000 MG tablet Take 1 tablet (1,000 mg total) by mouth 2 (two) times daily with a meal.   omeprazole (PRILOSEC) 40 MG capsule TAKE 1 CAPSULE BY MOUTH EVERY DAY   OneTouch Delica Lancets 85Y MISC 1 application by Does not apply route 2 (two) times daily. Use to check blood sugar up to two times daily. E11.69   Semaglutide,0.25 or 0.5MG /DOS, (OZEMPIC, 0.25 OR 0.5 MG/DOSE,) 2 MG/1.5ML SOPN Inject 0.5 mg into the skin once a week.   temazepam (RESTORIL) 15 MG capsule TAKE 1 CAPSULE BY MOUTH AT BEDTIME AS NEEDED FOR SLEEP.   traMADol (ULTRAM) 50 MG tablet TAKE 1 TABLET BY MOUTH EVERY 6 HOURS AS NEEDED FOR musculoskeletal  PAIN   vitamin B-12 (CYANOCOBALAMIN) 500 MCG tablet Take 500 mcg by mouth daily.   No facility-administered encounter medications on file as of 09/05/2021.    Allergies (verified) Vicodin [hydrocodone-acetaminophen], Acetaminophen, and Vicodin hp [hydrocodone-acetaminophen]   History: Past  Medical History:  Diagnosis Date   Actinic keratoses    follows with dermatology    Arthritis    Asthma    Chronic back pain    DDD,herniated disc/spondylosis/radiculopathy   COPD (chronic obstructive pulmonary disease) (HCC)    Diabetes mellitus    1980s   Early cataracts, bilateral    Dr. [name] [place]   GERD (gastroesophageal reflux disease)    takes Omeprazole daily   Hemorrhoids    History of colonic polyps    colonoscopy 04/2012 - Dr Candace Cruise   Hyperlipidemia    takes Pravastatin daily   Hypertension    takes Metoprolol/HCTZ  daily   Hyperthyroidism    Insomnia    Joint pain    fingers   Joint swelling    Pneumonia    x 2 ;in Dec 2010/2011   PONV (postoperative nausea and vomiting)    Right leg weakness    Past Surgical History:  Procedure Laterality Date   ABDOMINAL HYSTERECTOMY     at age 87   BACK SURGERY  20+yrs ago   CATARACT EXTRACTION W/PHACO Left 01/04/2019   Procedure: CATARACT EXTRACTION PHACO AND INTRAOCULAR LENS PLACEMENT ([place])  LEFT;  Surgeon: Leandrew Koyanagi, MD;  Location: [place];  Service: Ophthalmology;  Laterality: Left;  GIVE IV ZOFRAN   CATARACT EXTRACTION W/PHACO Right 02/01/2019   Procedure: CATARACT EXTRACTION PHACO AND INTRAOCULAR LENS PLACEMENT ([place])  RIGHT DIABETIC;  Surgeon: Leandrew Koyanagi, MD;  Location: [place];  Service: Ophthalmology;  Laterality: Right;  DIABETIC GIVE IV ZOFRAN   COLONOSCOPY     COLONOSCOPY N/A 05/13/2015   Procedure: COLONOSCOPY;  Surgeon: Hulen Luster, MD;  Location[facility] ENDOSCOPY;  Service: Gastroenterology;  Laterality: N/A;   COLONOSCOPY WITH PROPOFOL N/A 05/10/2020   Procedure: COLONOSCOPY WITH PROPOFOL;  Surgeon: Virgel Manifold, MD;  Location: [facility] ENDOSCOPY;  Service: Endoscopy;  Laterality: N/A;   ESOPHAGOGASTRODUODENOSCOPY  2011   ESOPHAGOGASTRODUODENOSCOPY (EGD) WITH PROPOFOL N/A 09/02/2017   Procedure: ESOPHAGOGASTRODUODENOSCOPY (EGD) WITH PROPOFOL;  Surgeon: Virgel Manifold, MD;  Location: [facility] ENDOSCOPY;  Service: Endoscopy;  Laterality: N/A;   FOOT SURGERY  2011   bunionectomy and knot removed from bottom of both feet   JOINT REPLACEMENT Left 2014   hip  Kraskinski   LUMBAR LAMINECTOMY/DECOMPRESSION MICRODISCECTOMY  09/10/2011   Procedure: LUMBAR LAMINECTOMY/DECOMPRESSION MICRODISCECTOMY 1 LEVEL;  Surgeon: Hosie Spangle, MD;  Location: [place] NEURO ORS;  Service: Neurosurgery;  Laterality: Right;  RIGHT Lumbar  Laminotomy and microdiskectomy Lumbar Four-Five   LUMBAR LAMINECTOMY/DECOMPRESSION  MICRODISCECTOMY N/A 06/26/2015   Procedure: LUMBAR LAMINECTOMY/DECOMPRESSION MICRODISCECTOMY 1 LEVEL;  Surgeon: Jovita Gamma, MD;  Location: [place] NEURO ORS;  Service: Neurosurgery;  Laterality: N/A;  L3 and L4 Laminectomies   MELANOMA EXCISION  2011   on nose/back on neck   THORACIC DISC SURGERY  at age 34 and 89    mass of veins that had gotten into muscle and wrapped around rib;3 ribs also removed from left side   TONSILLECTOMY  as a child   and adenoids    TOTAL HIP ARTHROPLASTY Left 2014   Dr. Mack Guise   TUBAL LIGATION     VAGINA SURGERY     vaginal wall ruptured    Family History  Problem Relation Age of Onset   Heart disease Brother        valve replacement   COPD Brother    COPD Mother        Emphysema   Diabetes Mother  Other Mother        Died in house fire at age 10.   Heart disease Father    Seizures Father    Cancer Father        Lung cancer with brain metastasis   Asthma Brother    COPD Son 17   Cancer Other        aunt ? maternal vs paternal side lung cancer    Diabetes Maternal Grandmother    Anesthesia problems Neg Hx    Hypotension Neg Hx    Malignant hyperthermia Neg Hx    Pseudochol deficiency Neg Hx    Thyroid disease Neg Hx    Breast cancer Neg Hx    Social History   Socioeconomic History   Marital status: Widowed    Spouse name: Not on file   Number of children: 2   Years of education: 51   Highest education level: High school graduate  Occupational History   Occupation: Retired  Tobacco Use   Smoking status: Former    Packs/day: 0.25    Years: 20.00    Pack years: 5.00    Types: Cigarettes    Quit date: 2018    Years since quitting: 5.1   Smokeless tobacco: Never   Tobacco comments:    Pt quit 11/08/2014.Pt had quit smoking for 13 years before starting back in 2010; per 05/2017 visit quit smoking 2017 smoked total 25 years 1 ppd   Vaping Use   Vaping Use: Never used  Substance and Sexual Activity   Alcohol use: No     Alcohol/week: 0.0 standard drinks   Drug use: No   Sexual activity: Never    Birth control/protection: Surgical  Other Topics Concern   Not on file  Social History Narrative   Widowed.    Lives alone.   Right-handed.    2 cups caffeine per day.   Social Determinants of Health   Financial Resource Strain: Low Risk    Difficulty of Paying Living Expenses: Not hard at all  Food Insecurity: No Food Insecurity   Worried About Charity fundraiser in the Last Year: Never true   [place] in the Last Year: Never true  Transportation Needs: No Transportation Needs   Lack of Transportation (Medical): No   Lack of Transportation (Non-Medical): No  Physical Activity: Not on file  Stress: No Stress Concern Present   Feeling of Stress : Not at all  Social Connections: Unknown   Frequency of Communication with Friends and Family: More than three times a week   Frequency of Social Gatherings with Friends and Family: Once a week   Attends Religious Services: Not on Electrical engineer or Organizations: Yes   Attends Archivist Meetings: Not on file   Marital Status: Widowed    Tobacco Counseling Counseling given: Not Answered Tobacco comments: Pt quit 11/08/2014.Pt had quit smoking for 13 years before starting back in 2010; per 05/2017 visit quit smoking 2017 smoked total 25 years 1 ppd    Clinical Intake:  Pre-visit preparation completed: Yes        Diabetes: Yes (Followed by PCP and CCM)  How often do you need to have someone help you when you read instructions, pamphlets, or other written materials from your doctor or pharmacy?: 1 - Never    Interpreter Needed?: No      Activities of Daily Living In your present state of health, do you have any  difficulty performing the following activities: 09/05/2021  Hearing? N  Vision? N  Difficulty concentrating or making decisions? N  Walking or climbing stairs? Y  Comment Unsteady gait. Walker/cane in use  as needed.  Dressing or bathing? N  Doing errands, shopping? N  Preparing Food and eating ? N  Using the Toilet? N  In the past six months, have you accidently leaked urine? Y  Comment Managed with daily pad  Do you have problems with loss of bowel control? N  Managing your Medications? N  Managing your Finances? N  Housekeeping or managing your Housekeeping? N  Comment Paces self.  Some recent data might be hidden    Patient Care Team: Crecencio Mc, MD as PCP - General (Internal Medicine) Jovita Gamma, MD as Consulting Physician (Neurosurgery) De Hollingshead, RPH-CPP (Pharmacist)  Indicate any recent Medical Services you may have received from other than [facility] providers in the past year (date may be approximate).     Assessment:   This is a routine wellness examination for Vessie.   Virtual Visit via Telephone Note  I connected with  SALA TAGUE on 09/05/21 at  9:45 AM EST by telephone and verified that I am speaking with the correct person using two identifiers.  Persons participating in the virtual visit: patient/Nurse Health Advisor   I discussed the limitations, risks, security and privacy concerns of performing an evaluation and management service by telephone and the availability of in person appointments. The patient expressed understanding and agreed to proceed.  Interactive audio and video telecommunications were attempted between this nurse and patient, however failed, due to patient having technical difficulties OR patient did not have access to video capability.  We continued and completed visit with audio only.  Some vital signs may be absent or patient reported.   Hearing/Vision screen Hearing Screening - Comments:: Patient is able to hear conversational tones without difficulty. No issues reported.  Vision Screening - Comments:: They have seen their ophthalmologist.  Wears reading glasses. Cataract extracted, bilateral  Dietary issues and  exercise activities discussed: Current Exercise Habits: Home exercise routine, Intensity: Mild Healthy diet Good fluid intake  Goals Addressed               This Visit's Progress     Patient Stated     Weight (lb) < 140 lb (63.5 kg) (pt-stated)   157 lb (71.2 kg)     Healthy diet Stay hydrated Stay active       Depression Screen PHQ 2/9 Scores 09/05/2021 08/18/2021 02/14/2021 11/12/2020 09/04/2020 09/06/2019 09/04/2019  PHQ - 2 Score 0 4 2 0 0 3 1  PHQ- 9 Score 1 7 3 2  - 9 4    Fall Risk Fall Risk  09/05/2021 08/18/2021 02/14/2021 11/12/2020 09/04/2020  Falls in the past year? - 1 1 1 1   Number falls in past yr: - 1 1 1  -  Comment - - - - -  Injury with Fall? - 0 0 0 -  Comment - - - - -  Risk for fall due to : - History of fall(s) - - -  Follow up Falls evaluation completed Falls evaluation completed Falls evaluation completed Falls evaluation completed Falls evaluation completed    [place]: Home free of loose throw rugs in walkways, pet beds, electrical cords, etc? Yes  Adequate lighting in your home to reduce risk of falls? Yes   ASSISTIVE DEVICES UTILIZED TO PREVENT FALLS: Life alert?  No  Use of a cane, walker or w/c? Yes Grab bars in the bathroom? No  Shower chair or bench in shower? Yes  Elevated toilet seat or a handicapped toilet? No   TIMED UP AND GO: Was the test performed? No .   Cognitive Function: Patient is alert and oriented x3.  Enjoys reading, coloring and other brain health stimulating activities.  MMSE - Mini Mental State Exam 01/30/2016  Orientation to time 5  Orientation to Place 5  Registration 3  Attention/ Calculation 5  Recall 3  Language- name 2 objects 2  Language- repeat 1  Language- follow 3 step command 3  Language- read & follow direction 1  Write a sentence 1  Copy design 1  Total score 30     6CIT Screen 09/04/2019 09/02/2018  What Year? 0 points 0 points  What month? 0 points 0 points  What time?  0 points 0 points  Count back from 20 0 points 0 points  Months in reverse 0 points 0 points  Repeat phrase 0 points 0 points  Total Score 0 0    Immunizations Immunization History  Administered Date(s) Administered   Fluad Quad(high Dose 65+) 04/22/2020   Influenza Split 09/11/2011   Influenza, High Dose Seasonal PF 06/03/2016, 04/05/2017, 04/11/2018   Influenza,inj,Quad PF,6+ Mos 04/27/2013, 09/17/2014, 04/12/2015   Influenza-Unspecified 04/26/2019, 04/19/2021   Moderna SARS-COV2 Booster Vaccination 10/14/2020   PFIZER(Purple Top)SARS-COV-2 Vaccination 09/01/2019, 09/26/2019, 06/04/2020   Pneumococcal Conjugate-13 02/09/2014   Pneumococcal Polysaccharide-23 09/11/2011, 02/25/2018   Tdap 02/09/2014   Shingrix Completed?: No.    Education has been provided regarding the importance of this vaccine. Patient has been advised to call insurance company to determine out of pocket expense if they have not yet received this vaccine. Advised may also receive vaccine at local pharmacy or Health Dept. Verbalized acceptance and understanding.  Screening Tests Health Maintenance  Topic Date Due   FOOT EXAM  08/07/2021   COVID-19 Vaccine (4 - Booster for Pfizer series) 09/21/2021 (Originally 12/09/2020)   Zoster Vaccines- Shingrix (1 of 2) 12/06/2021 (Originally 02/11/1998)   MAMMOGRAM  09/23/2021   OPHTHALMOLOGY EXAM  12/23/2021   HEMOGLOBIN A1C  02/15/2022   COLONOSCOPY (Pts 45-18yrs Insurance coverage will need to be confirmed)  05/11/2023   TETANUS/TDAP  02/10/2024   Pneumonia Vaccine 70+ Years old  Completed   INFLUENZA VACCINE  Completed   DEXA SCAN  Completed   Hepatitis C Screening  Completed   HPV VACCINES  Aged Out   Health Maintenance Health Maintenance Due  Topic Date Due   FOOT EXAM  08/07/2021   Mammogram- agrees to schedule next week.   Vision Screening: Recommended annual ophthalmology exams for early detection of glaucoma and other disorders of the eye.  Dental  Screening: Recommended annual dental exams for proper oral hygiene  Community Resource Referral / Chronic Care Management: CRR required this visit?  No   CCM required this visit?  No      Plan:   Keep all routine maintenance appointments.   I have personally reviewed and noted the following in the patients chart:   Medical and social history Use of alcohol, tobacco or illicit drugs  Current medications and supplements including opioid prescriptions.  Functional ability and status Nutritional status Physical activity Advanced directives List of other physicians Hospitalizations, surgeries, and ER visits in previous 12 months Vitals Screenings to include cognitive, depression, and falls Referrals and appointments  In addition, I have reviewed and discussed with patient  certain preventive protocols, quality metrics, and best practice recommendations. A written personalized care plan for preventive services as well as general preventive health recommendations were provided to patient via mychart.     OBrien-Blaney,  L, LPN   08/10/9100    I have reviewed the above information and agree with above.   Deborra Medina, MD

## 2021-09-08 ENCOUNTER — Ambulatory Visit: Payer: Self-pay | Admitting: Pharmacist

## 2021-09-08 ENCOUNTER — Other Ambulatory Visit: Payer: Self-pay | Admitting: Internal Medicine

## 2021-09-08 NOTE — Chronic Care Management (AMB) (Signed)
?  Chronic Care Management  ? ?Note ? ?09/08/2021 ?Name: ELFIE COSTANZA MRN: [id number] DOB: 01-19-1946 ? ? ? ?Closing pharmacy CCM case at this time. Will collaborate with Care Guide to outreach to schedule follow up with RN CM. Patient has clinic contact information for future questions or concerns.  ? ?Catie Darnelle Maffucci, PharmD, [place], CPP ?Clinical Pharmacist ?Therapist, music at [employer] ?[phone] ? ?

## 2021-09-16 ENCOUNTER — Encounter: Payer: Self-pay | Admitting: Podiatry

## 2021-09-16 ENCOUNTER — Ambulatory Visit: Payer: Medicare HMO | Admitting: Podiatry

## 2021-09-16 ENCOUNTER — Other Ambulatory Visit: Payer: Self-pay

## 2021-09-16 DIAGNOSIS — M79675 Pain in left toe(s): Secondary | ICD-10-CM

## 2021-09-16 DIAGNOSIS — E0843 Diabetes mellitus due to underlying condition with diabetic autonomic (poly)neuropathy: Secondary | ICD-10-CM

## 2021-09-16 DIAGNOSIS — M79674 Pain in right toe(s): Secondary | ICD-10-CM

## 2021-09-16 DIAGNOSIS — B351 Tinea unguium: Secondary | ICD-10-CM

## 2021-09-16 DIAGNOSIS — E119 Type 2 diabetes mellitus without complications: Secondary | ICD-10-CM

## 2021-09-16 NOTE — Progress Notes (Signed)
? ?SUBJECTIVE ?Patient with a history of diabetes mellitus presents to office today complaining of elongated, thickened nails that cause pain while ambulating in shoes.  Patient is unable to trim their own nails.  Patient also states that her PCP sent her over here for diabetic foot exam and to be scheduled for diabetic shoes and insoles.  Patient is here for further evaluation and treatment. ? ? ?Past Medical History:  ?Diagnosis Date  ? Actinic keratoses   ? follows with dermatology   ? Arthritis   ? Asthma   ? Chronic back pain   ? DDD,herniated disc/spondylosis/radiculopathy  ? COPD (chronic obstructive pulmonary disease) ([place])   ? Diabetes mellitus   ? 1980s  ? Early cataracts, bilateral   ? Dr. [name] [place]  ? GERD (gastroesophageal reflux disease)   ? takes Omeprazole daily  ? Hemorrhoids   ? History of colonic polyps   ? colonoscopy 04/2012 - Dr Candace Cruise  ? Hyperlipidemia   ? takes Pravastatin daily  ? Hypertension   ? takes Metoprolol/HCTZ daily  ? Hyperthyroidism   ? Insomnia   ? Joint pain   ? fingers  ? Joint swelling   ? Pneumonia   ? x 2 ;in Dec 2010/2011  ? PONV (postoperative nausea and vomiting)   ? Right leg weakness   ? ?Past Surgical History:  ?Procedure Laterality Date  ? ABDOMINAL HYSTERECTOMY    ? at age 64  ? BACK SURGERY  20+yrs ago  ? CATARACT EXTRACTION W/PHACO Left 01/04/2019  ? Procedure: CATARACT EXTRACTION PHACO AND INTRAOCULAR LENS PLACEMENT ([place])  LEFT;  Surgeon: Leandrew Koyanagi, MD;  Location: [place];  Service: Ophthalmology;  Laterality: Left;  GIVE IV ZOFRAN  ? CATARACT EXTRACTION W/PHACO Right 02/01/2019  ? Procedure: CATARACT EXTRACTION PHACO AND INTRAOCULAR LENS PLACEMENT ([place])  RIGHT DIABETIC;  Surgeon: Leandrew Koyanagi, MD;  Location: [place];  Service: Ophthalmology;  Laterality: Right;  DIABETIC ?GIVE IV ZOFRAN  ? COLONOSCOPY    ? COLONOSCOPY N/A 05/13/2015  ? Procedure: COLONOSCOPY;  Surgeon: Hulen Luster, MD;  Location: [facility]  ENDOSCOPY;  Service: Gastroenterology;  Laterality: N/A;  ? COLONOSCOPY WITH PROPOFOL N/A 05/10/2020  ? Procedure: COLONOSCOPY WITH PROPOFOL;  Surgeon: Virgel Manifold, MD;  Location: [facility] ENDOSCOPY;  Service: Endoscopy;  Laterality: N/A;  ? ESOPHAGOGASTRODUODENOSCOPY  2011  ? ESOPHAGOGASTRODUODENOSCOPY (EGD) WITH PROPOFOL N/A 09/02/2017  ? Procedure: ESOPHAGOGASTRODUODENOSCOPY (EGD) WITH PROPOFOL;  Surgeon: Virgel Manifold, MD;  Location: [facility] ENDOSCOPY;  Service: Endoscopy;  Laterality: N/A;  ? FOOT SURGERY  2011  ? bunionectomy and knot removed from bottom of both feet  ? JOINT REPLACEMENT Left 2014  ? hip  Kraskinski  ? LUMBAR LAMINECTOMY/DECOMPRESSION MICRODISCECTOMY  09/10/2011  ? Procedure: LUMBAR LAMINECTOMY/DECOMPRESSION MICRODISCECTOMY 1 LEVEL;  Surgeon: Hosie Spangle, MD;  Location: [place] NEURO ORS;  Service: Neurosurgery;  Laterality: Right;  RIGHT Lumbar  Laminotomy and microdiskectomy Lumbar Four-Five  ? LUMBAR LAMINECTOMY/DECOMPRESSION MICRODISCECTOMY N/A 06/26/2015  ? Procedure: LUMBAR LAMINECTOMY/DECOMPRESSION MICRODISCECTOMY 1 LEVEL;  Surgeon: Jovita Gamma, MD;  Location: [place] NEURO ORS;  Service: Neurosurgery;  Laterality: N/A;  L3 and L4 Laminectomies  ? MELANOMA EXCISION  2011  ? on nose/back on neck  ? THORACIC DISC SURGERY  at age 74 and 75   ? mass of veins that had gotten into muscle and wrapped around rib;3 ribs also removed from left side  ? TONSILLECTOMY  as a child  ? and adenoids   ? TOTAL HIP ARTHROPLASTY  Left 2014  ? Dr. Mack Guise  ? TUBAL LIGATION    ? VAGINA SURGERY    ? vaginal wall ruptured   ? ?Allergies  ?Allergen Reactions  ? Vicodin [Hydrocodone-Acetaminophen] Other (See Comments)  ?  Insomnia   ? Acetaminophen   ?  Other reaction(s): Insomnia  ? Vicodin Hp [Hydrocodone-Acetaminophen]   ? ? ?OBJECTIVE ?General Patient is awake, alert, and oriented x 3 and in no acute distress. ?Derm Skin is dry and supple bilateral. Negative open lesions or macerations. Remaining  integument unremarkable. Nails are tender, long, thickened and dystrophic with subungual debris, consistent with onychomycosis, 1-5 bilateral. No signs of infection noted. ?Vasc  DP and PT pedal pulses palpable bilaterally. Temperature gradient within normal limits.  ?Neuro Epicritic and protective threshold sensation diminished bilaterally.  ?Musculoskeletal Exam No symptomatic pedal deformities noted bilateral. Muscular strength within normal limits. ? ?ASSESSMENT ?1. Diabetes Mellitus w/ peripheral neuropathy ?2.  Pain due to onychomycosis of toenails bilateral ? ?PLAN OF CARE ?1. Patient evaluated today.  Comprehensive diabetic foot exam performed ?2. Instructed to maintain good pedal hygiene and foot care. Stressed importance of controlling blood sugar.  ?3. Mechanical debridement of nails 1-5 bilaterally performed using a nail nipper. Filed with dremel without incident.  ?4.  Appointment with Pedorthist for custom molded diabetic insoles and shoes  ?5.  Return to clinic in 3 mos.  ? ? ? ?Edrick Kins, DPM ?[place] ? ?Dr. Edrick Kins, DPM  ?  ?[address]                                      ?[place], [place] [address]                ?Office [phone]  ?Fax [phone] ? ? ? ? ? ?

## 2021-09-23 ENCOUNTER — Ambulatory Visit (INDEPENDENT_AMBULATORY_CARE_PROVIDER_SITE_OTHER): Payer: Medicare HMO | Admitting: *Deleted

## 2021-09-23 ENCOUNTER — Other Ambulatory Visit: Payer: Self-pay

## 2021-09-23 DIAGNOSIS — E1140 Type 2 diabetes mellitus with diabetic neuropathy, unspecified: Secondary | ICD-10-CM | POA: Diagnosis not present

## 2021-09-23 DIAGNOSIS — Z794 Long term (current) use of insulin: Secondary | ICD-10-CM | POA: Diagnosis not present

## 2021-09-23 NOTE — Progress Notes (Signed)
Patient came into office stated she was advised by PCP to come in for glucometer teaching on Libre 2, had patient view instructional video and then we discussed th technique of placing the sensor and setting up the [place].Patient then demonstrated technique for understanding.  ?

## 2021-09-24 ENCOUNTER — Other Ambulatory Visit: Payer: Self-pay | Admitting: Internal Medicine

## 2021-09-29 ENCOUNTER — Other Ambulatory Visit: Payer: Self-pay | Admitting: Internal Medicine

## 2021-09-29 ENCOUNTER — Other Ambulatory Visit: Payer: Self-pay

## 2021-09-29 ENCOUNTER — Ambulatory Visit: Payer: Medicare HMO

## 2021-09-29 DIAGNOSIS — E0843 Diabetes mellitus due to underlying condition with diabetic autonomic (poly)neuropathy: Secondary | ICD-10-CM

## 2021-09-29 NOTE — Progress Notes (Signed)
SITUATION ?Reason for Consult: Evaluation for Prefabricated Diabetic Shoes and Custom Diabetic Inserts. ?Patient / Caregiver Report: Patient would like well fitting shoes ? ?OBJECTIVE DATA: ?Patient History / Diagnosis:  ?  ICD-10-CM   ?1. Diabetes mellitus due to underlying condition with diabetic autonomic neuropathy, unspecified whether long term insulin use (HCC)  E08.43   ?  ? ?Physician treating diabetes:  Crecencio Mc, MD ? ?Current or Previous Devices:   None and no history ? ?In-Person Foot Examination: ?Ulcers & Callousing:   None ?Deformities:    None ?Sensation:    Compromised  ?Shoe Size:     7.5W ? ?ORTHOTIC RECOMMENDATION ?Recommended Devices: ?- 1x pair prefabricated PDAC approved diabetic shoes; Patient Selected Orthofeet Milinda Hirschfeld 844 Size 7.5W ?- 3x pair custom-to-patient PDAC approved vacuum formed diabetic insoles. ? ?GOALS OF SHOES AND INSOLES ?- Reduce shear and pressure ?- Reduce / Prevent callus formation ?- Reduce / Prevent ulceration ?- Protect the fragile healing compromised diabetic foot. ? ?Patient would benefit from diabetic shoes and inserts as patient has diabetes mellitus and the patient has one or more of the following conditions: ?- Peripheral neuropathy with evidence of callus formation ?- Foot deformity ?- Poor circulation ? ?ACTIONS PERFORMED ?Potential out of pocket cost was communicated to patient. Patient understood and consented to measurement and casting. Patient was casted for insoles via crush box and measured for shoes via brannock device. Procedure was explained and patient tolerated procedure well. All questions were answered and concerns addressed. Casts were shipped to central fabrication for HOLD until Certificate of Medical Necessity or otherwise necessary authorization from insurance is obtained. ? ?PLAN ?Shoes are to be ordered and casts released from hold once all appropriate paperwork is complete. Patient is to be contacted and scheduled for fitting once  shoes and insoles have been fabricated and received. ? ?

## 2021-10-03 DIAGNOSIS — Z794 Long term (current) use of insulin: Secondary | ICD-10-CM | POA: Diagnosis not present

## 2021-10-03 DIAGNOSIS — E1140 Type 2 diabetes mellitus with diabetic neuropathy, unspecified: Secondary | ICD-10-CM | POA: Diagnosis not present

## 2021-10-03 DIAGNOSIS — E039 Hypothyroidism, unspecified: Secondary | ICD-10-CM

## 2021-10-03 DIAGNOSIS — I10 Essential (primary) hypertension: Secondary | ICD-10-CM | POA: Diagnosis not present

## 2021-10-03 DIAGNOSIS — E7800 Pure hypercholesterolemia, unspecified: Secondary | ICD-10-CM

## 2021-10-06 ENCOUNTER — Ambulatory Visit: Payer: Medicare HMO | Admitting: *Deleted

## 2021-10-06 DIAGNOSIS — I10 Essential (primary) hypertension: Secondary | ICD-10-CM

## 2021-10-06 NOTE — Chronic Care Management (AMB) (Signed)
?  Care Management  ? ?Follow Up Note ? ? ?10/06/2021 ?Name: CORIE ALLIS MRN: [id number] DOB: Oct 27, 1945 ? ? ?Referred by: Crecencio Mc, MD ?Reason for referral : Chronic Care Management (INITIAL, HTN, DM) ? ? ?Successful telephone   outreach to patient.  RNCM introduced self and role.  Patient agrees for further RNCM outreaches.  In process of driving to store,requesting call back to complete initial assessment ? ?Follow Up Plan: The care management team will reach out to the patient again over the next 30 days.  ? o ?Hubert Azure RN, MSN ?RN Care Management Coordinator ?[place] ?[phone] ?[email or web address] ? ? ?

## 2021-10-07 ENCOUNTER — Telehealth: Payer: Medicare HMO

## 2021-10-15 ENCOUNTER — Other Ambulatory Visit: Payer: Self-pay | Admitting: Internal Medicine

## 2021-10-21 ENCOUNTER — Encounter: Payer: Self-pay | Admitting: Internal Medicine

## 2021-10-21 ENCOUNTER — Other Ambulatory Visit: Payer: Self-pay | Admitting: Internal Medicine

## 2021-10-21 DIAGNOSIS — E1140 Type 2 diabetes mellitus with diabetic neuropathy, unspecified: Secondary | ICD-10-CM

## 2021-10-21 NOTE — Telephone Encounter (Signed)
The Ozempic pen you will be picking up from the pharmacy is the only one in stock and is a much higher dose than you need to take.   ? ?Please confirm that the one you are picking up is gold colored  and says "'8mg'$ /60m"  as the concentration  ? ? You will need to  modify the dose the pen delivers by counting 37 clicks to deliver a dose of 1.0 mg . From the pen..  you will be able to get 8 doses out of this pen    ?

## 2021-10-22 ENCOUNTER — Other Ambulatory Visit: Payer: Self-pay | Admitting: Internal Medicine

## 2021-10-23 ENCOUNTER — Other Ambulatory Visit: Payer: Self-pay | Admitting: Internal Medicine

## 2021-10-23 MED ORDER — OZEMPIC (0.25 OR 0.5 MG/DOSE) 2 MG/3ML ~~LOC~~ SOPN: 0.2500 mg | PEN_INJECTOR | SUBCUTANEOUS | 2 refills | Status: DC

## 2021-10-23 MED ORDER — OZEMPIC (0.25 OR 0.5 MG/DOSE) 2 MG/1.5ML ~~LOC~~ SOPN: 0.2500 mg | PEN_INJECTOR | SUBCUTANEOUS | 2 refills | Status: DC

## 2021-10-23 NOTE — Telephone Encounter (Signed)
Spoke with pt and she did receive the mychart message about the pen and how many clicks she will need to get the correct dose. She stated that the pharmacy text her today and stated that they needed to speak with [place] about the ozempic rx. Called the pharmacy and they stated that the Ozempic pen 2 mg/1.5 mL is no longer available and a new rx for the 2 mg/ 3 ml pen will need to be sent in. I have pended the rx.  ?

## 2021-10-23 NOTE — Telephone Encounter (Signed)
There is several messages floating around about pt's ozempic. Spoke with pt and she did receive the mychart message about the pen and how many clicks she will need to get the correct dose. She stated that the pharmacy text her today and stated that they needed to speak with [place] about the ozempic rx. Called the pharmacy and they stated that the Ozempic pen 2 mg/1.5 mL is no longer available and a new rx for the 2 mg/ 3 ml pen will need to be sent in. Pt stated that she is currently taking the 0.5 mg dose and has been on it since January with no side effects. Would you like pt to go up to the 1 mg dose. If so I have pended the rx.   ?

## 2021-10-23 NOTE — Telephone Encounter (Signed)
Disregard this message. Please see the rx refill message that was also sent.  ?

## 2021-10-23 NOTE — Telephone Encounter (Signed)
See pharmacy comment

## 2021-10-23 NOTE — Telephone Encounter (Signed)
I am able to see where patient has read this mychart message. I called pt but was unable to reach however I did not LM since I saw mychart was read.  ?

## 2021-10-27 ENCOUNTER — Ambulatory Visit: Payer: Medicare HMO | Admitting: *Deleted

## 2021-10-27 DIAGNOSIS — E1140 Type 2 diabetes mellitus with diabetic neuropathy, unspecified: Secondary | ICD-10-CM

## 2021-10-27 DIAGNOSIS — I10 Essential (primary) hypertension: Secondary | ICD-10-CM

## 2021-10-27 NOTE — Patient Instructions (Addendum)
Visit Information ? ?Thank you for taking time to visit with me today. Please don't hesitate to contact me if I can be of assistance to you before our next scheduled telephone appointment. ? ?Following are the goals we discussed today:  ?Take all medications as prescribed ?Attend all scheduled provider appointments ?Call provider office for new concerns or questions  ?check blood sugar at prescribed times: 4 times daily and when you have symptoms of low or high blood sugar ?set a realistic goal ?wear comfortable, cotton socks ?wear comfortable, well-fitting shoes ?check blood pressure 3 times per week ?learn about high blood pressure ?keep a blood pressure log ?take blood pressure log to all doctor appointments ?keep all doctor appointments ?eat more whole grains, fruits and vegetables, lean meats and healthy fats ? ?Our next appointment is by telephone on 5/22 at 1100 ? ?Please call the care guide team at [phone] if you need to cancel or reschedule your appointment.  ? ?If you are experiencing a Mental Health or [place] or need someone to talk to, please call the Suicide and Crisis Lifeline: 988 ?call the [place] National Suicide Prevention Lifeline: 416 066 6144 or TTY: 450 827 6958 TTY [phone]) to talk to a trained counselor ?call 1-800-273-TALK (toll free, 24 hour hotline) ?call 911  ? ?Following is a copy of your full plan of care:  ?Care Plan : [place] of Care (Adult)  ?Updates made by Leona Singleton, RN since 10/31/2021 12:00 AM  ?  ? ?Problem: KNOWLEDGE DEFICIT RELATED TO SELF CARE MANAGEMENT OF CHRONIC MEDICAL CONDITIONS   ?Priority: Medium  ?  ? ?Long-Range Goal: PATIENT WILL WORK WITH CCM TEAM TO GAIN KNOWLEDGE TO BETTER SELF MANAGE CHRONIC CONDITIONS   ?Start Date: 10/27/2021  ?Expected End Date: 10/26/2022  ?Note:   ?Current Barriers:  ?Knowledge Deficits related to plan of care for management of HTN and DMII  ?Financial Constraints  ?4/24--States she feels well.   Wears continuous CBG meter.  Fasting blood sugar was 69 and 139 after treatment.  Frequent hypoglycemic episodes in mornings; then spikes n the afternoons  Discussed and encouraged bedtime snack.  Has not been monitoring home blood pressures. ? ?RNCM Clinical Goal(s):  ?Patient will demonstrate Improved adherence to prescribed treatment plan for HTN and DMII as evidenced by DECREASING HGB A1C BY 0.3 POINTS AND CHECKING BPs FEW TIMES A WEEK  through collaboration with RN Care manager, provider, and care team.  ? ?Interventions: ?1:1 collaboration with primary care provider regarding development and update of comprehensive plan of care as evidenced by provider attestation and co-signature ?Inter-disciplinary care team collaboration (see longitudinal plan of care) ?Evaluation of current treatment plan related to  self management and patient's adherence to plan as established by provider ? ? ?Diabetes Interventions:  (Status:  New goal.) Long Term Goal ?Assessed patient's understanding of A1c goal: <7% ?Provided education to patient about basic DM disease process ?Reviewed medications with patient and discussed importance of medication adherence ?Counseled on importance of regular laboratory monitoring as prescribed ?Discussed plans with patient for ongoing care management follow up and provided patient with direct contact information for care management team ?Provided patient with written educational materials related to hypo and hyperglycemia and importance of correct treatment ?Advised patient, providing education and rationale, to check cbg 4 times a day and record, calling PCP for findings outside established parameters ?Encouraged to eat bedtime snack to help prevent hypoglycemia ?Lab Results  ?Component Value Date  ? HGBA1C 9.1 (H) 08/18/2021  ? ?  Hypertension Interventions:  (Status:  New goal.) Long Term Goal ?Last practice recorded BP readings:  ?BP Readings from Last 3 Encounters:  ?08/18/21 (!) 152/76   ?05/22/21 138/70  ?02/14/21 (!) 142/84  ?Most recent eGFR/CrCl: No results found for: EGFR  No components found for: CRCL ? ?Evaluation of current treatment plan related to hypertension self management and patient's adherence to plan as established by provider ?Provided education to patient re: stroke prevention, s/s of heart attack and stroke ?Reviewed medications with patient and discussed importance of compliance ?Advised patient, providing education and rationale, to monitor blood pressure daily and record, calling PCP for findings outside established parameters ?Provided education on prescribed diet low salt carb modified ?Discussed complications of poorly controlled blood pressure such as heart disease, stroke, circulatory complications, vision complications, kidney impairment, sexual dysfunction ?Screening for signs and symptoms of depression related to chronic disease state  ?Assessed social determinant of health barriers ? ?Patient Goals/Self-Care Activities: ?Take all medications as prescribed ?Attend all scheduled provider appointments ?Call provider office for new concerns or questions  ?check blood sugar at prescribed times: 4 times daily and when you have symptoms of low or high blood sugar ?set a realistic goal ?wear comfortable, cotton socks ?wear comfortable, well-fitting shoes ?check blood pressure 3 times per week ?learn about high blood pressure ?keep a blood pressure log ?take blood pressure log to all doctor appointments ?keep all doctor appointments ?eat more whole grains, fruits and vegetables, lean meats and healthy fats ? ?Follow Up Plan:  The care management team will reach out to the patient again over the next 45 days. ? ?  ? ? ?Ms. Matarazzo was given information about Care Management services by the embedded care coordination team including:  ?Care Management services include personalized support from designated clinical staff supervised by her physician, including individualized plan  of care and coordination with other care providers ?24/7 contact phone numbers for assistance for urgent and routine care needs. ?The patient may stop CCM services at any time (effective at the end of the month) by phone call to the office staff. ? ?Patient agreed to services and verbal consent obtained.  ? ?Patient verbalizes understanding of instructions and care plan provided today and agrees to view in [place]. Active MyChart status confirmed with patient.   ? ?The care management team will reach out to the patient again over the next 45 days.  ? ?Hubert Azure RN, MSN ?RN Care Management Coordinator ?[place] ?[phone] ?[email or web address] ? ? ?  ?

## 2021-10-31 NOTE — Chronic Care Management (AMB) (Signed)
? Care Management ?  ? RN Visit Note ? ?10/31/2021 ?Name: Maria Hinton MRN: [id number] DOB: [birth year] ? ?Subjective: ?Maria Hinton is a 76 y.o. year old female who is a primary care patient of [place], Aris Everts, Hinton. The care management team was consulted for assistance with disease management and care coordination needs.   ? ?Engaged with patient by telephone for initial visit in response to provider referral for case management and/or care coordination services.  ? ?Consent to Services:  ? Ms. Cortopassi was given information about Care Management services today including:  ?Care Management services includes personalized support from designated clinical staff supervised by her physician, including individualized plan of care and coordination with other care providers ?24/7 contact phone numbers for assistance for urgent and routine care needs. ?The patient may stop case management services at any time by phone call to the office staff. ? ?Patient agreed to services and consent obtained.  ? ?Assessment: Review of patient past medical history, allergies, medications, health status, including review of consultants reports, laboratory and other test data, was performed as part of comprehensive evaluation and provision of chronic care management services.  ? ?SDOH (Social Determinants of Health) assessments and interventions performed:  ?SDOH Interventions   ? ?Flowsheet Row Most Recent Value  ?SDOH Interventions   ?Food Insecurity Interventions Intervention Not Indicated  ?Housing Interventions Intervention Not Indicated  ?Intimate Partner Violence Interventions Intervention Not Indicated  ?Transportation Interventions Intervention Not Indicated, Patient Resources (Friends/Family)  ? ?  ?  ? ?Care Plan ? ?Allergies  ?Allergen Reactions  ? Vicodin [Hydrocodone-Acetaminophen] Other (See Comments)  ?  Insomnia   ? Acetaminophen   ?  Other reaction(s): Insomnia  ? Vicodin Hp [Hydrocodone-Acetaminophen]   ? ? ?Outpatient  Encounter Medications as of 10/27/2021  ?Medication Sig  ? albuterol (PROVENTIL HFA;VENTOLIN HFA) 108 (90 Base) MCG/ACT inhaler Inhale 2 puffs into the lungs every 6 (six) hours as needed for wheezing.  ? aspirin EC 81 MG tablet Take 81 mg by mouth daily.  ? atenolol (TENORMIN) 50 MG tablet TAKE 1 TABLET BY MOUTH TWICE A DAY  ? atorvastatin (LIPITOR) 40 MG tablet TAKE 1 TABLET BY MOUTH DAILY AT 6 PM.  ? gabapentin (NEURONTIN) 300 MG capsule TAKE 1 CAPSULE BY MOUTH THREE TIMES A DAY  ? insulin detemir (LEVEMIR FLEXPEN) 100 UNIT/ML FlexPen Inject 16 Units into the skin daily.  ? levothyroxine (SYNTHROID) 112 MCG tablet Take 1 tablet (112 mcg total) by mouth daily.  ? losartan (COZAAR) 50 MG tablet TAKE 1 TABLET BY MOUTH EVERY DAY  ? meloxicam (MOBIC) 7.5 MG tablet TAKE 1 TABLET BY MOUTH EVERY DAY  ? metFORMIN (GLUCOPHAGE) 1000 MG tablet Take 1 tablet (1,000 mg total) by mouth 2 (two) times daily with a meal.  ? omeprazole (PRILOSEC) 40 MG capsule TAKE 1 CAPSULE BY MOUTH EVERY DAY  ? Semaglutide,0.25 or 0.'5MG'$ /DOS, (OZEMPIC, 0.25 OR 0.5 MG/DOSE,) 2 MG/3ML SOPN Inject 0.25 mg into the skin once a week.  ? temazepam (RESTORIL) 15 MG capsule TAKE 1 CAPSULE BY MOUTH AT BEDTIME AS NEEDED FOR SLEEP.  ? traMADol (ULTRAM) 50 MG tablet TAKE 1 TABLET BY MOUTH EVERY 6 HOURS AS NEEDED FOR musculoskeletal  PAIN  ? vitamin B-12 (CYANOCOBALAMIN) 500 MCG tablet Take 500 mcg by mouth daily.  ? BD PEN NEEDLE NANO 2ND GEN 32G X 4 MM MISC USE DAILY AS DIRECTED WITH LEVEMIR  ? glucose blood (ONETOUCH VERIO) test strip Use as instructed to check blood sugars 3  times daily  ? OneTouch Delica Lancets 01U MISC 1 application by Does not apply route 2 (two) times daily. Use to check blood sugar up to two times daily. E11.69  ? ?No facility-administered encounter medications on file as of 10/27/2021.  ? ? ?Patient Active Problem List  ? Diagnosis Date Noted  ? Bicipital tendonitis of right shoulder 08/04/2021  ? Arthritis of right acromioclavicular  joint 08/04/2021  ? Hyperkalemia 03/26/2021  ? Chronic right shoulder pain 02/16/2021  ? Left hip pain 02/16/2021  ? Major depressive disorder with current active episode 08/08/2020  ? Tubular adenoma of colon 08/07/2020  ? History of colonic polyps   ? Cecal polyp   ? Polyp of ascending colon   ? Hypothyroidism, postradioiodine therapy 05/06/2020  ? Need for immunization against influenza 04/22/2020  ? B12 deficiency 02/21/2020  ? Vertigo 10/10/2019  ? Joint pain in both hands 10/10/2019  ? Benign positional vertigo 09/08/2019  ? Orthostasis 09/08/2019  ? Cervical spondylosis with radiculopathy 09/08/2019  ? Adhesive capsulitis of shoulder 05/01/2019  ? Displacement of lumbar intervertebral disc without myelopathy 05/01/2019  ? Osteoarthritis of spinal facet joint 05/01/2019  ? Primary localized osteoarthritis of pelvic region and thigh 05/01/2019  ? Type 2 diabetes mellitus with diabetic neuropathy, unspecified ([place]) 04/17/2019  ? Hyperthyroidism 10/27/2018  ? Elevated alkaline phosphatase measurement 11/12/2017  ? Elevated liver enzymes 10/10/2017  ? Esophagitis   ? Columnar-lined esophagus   ? Gastric polyp   ? Hiatal hernia   ? Weakness 08/26/2017  ? Dysphagia 05/24/2017  ? Back pain 05/24/2017  ? Skin neoplasm 06/06/2016  ? Venous (peripheral) insufficiency 06/06/2016  ? Insomnia secondary to anxiety 12/12/2015  ? History of lumbar laminectomy for spinal cord decompression 08/18/2015  ? Extrinsic asthma, unspecified 02/21/2014  ? History of tobacco abuse 02/21/2014  ? Screening for osteoporosis 02/09/2014  ? Pulmonary disease 02/09/2014  ? Encounter for smoking cessation counseling 04/27/2013  ? Arthritis 04/27/2013  ? HTN (hypertension), benign 04/11/2013  ? HLD (hyperlipidemia) 04/11/2013  ? Anemia 06/13/2012  ? ? ?Conditions to be addressed/monitored: HTN and DMII ? ?Care Plan : [facility]  General Plan of Care (Adult)  ?Updates made by Leona Singleton, RN since 10/31/2021 12:00 AM  ?  ? ?Problem: KNOWLEDGE  DEFICIT RELATED TO SELF CARE MANAGEMENT OF CHRONIC MEDICAL CONDITIONS   ?Priority: Medium  ?  ? ?Long-Range Goal: PATIENT WILL WORK WITH CCM TEAM TO GAIN KNOWLEDGE TO BETTER SELF MANAGE CHRONIC CONDITIONS   ?Start Date: 10/27/2021  ?Expected End Date: 10/26/2022  ?Note:   ?Current Barriers:  ?Knowledge Deficits related to plan of care for management of HTN and DMII  ?Financial Constraints  ?4/24--States she feels well.  Wears continuous CBG meter.  Fasting blood sugar was 69 and 139 after treatment.  Frequent hypoglycemic episodes in mornings; then spikes n the afternoons  Discussed and encouraged bedtime snack.  Has not been monitoring home blood pressures. ? ?RNCM Clinical Goal(s):  ?Patient will demonstrate Improved adherence to prescribed treatment plan for HTN and DMII as evidenced by DECREASING HGB A1C BY 0.3 POINTS AND CHECKING BPs FEW TIMES A WEEK  through collaboration with RN Care manager, provider, and care team.  ? ?Interventions: ?1:1 collaboration with primary care provider regarding development and update of comprehensive plan of care as evidenced by provider attestation and co-signature ?Inter-disciplinary care team collaboration (see longitudinal plan of care) ?Evaluation of current treatment plan related to  self management and patient's adherence to plan as established by provider ? ? ?  Diabetes Interventions:  (Status:  New goal.) Long Term Goal ?Assessed patient's understanding of A1c goal: <7% ?Provided education to patient about basic DM disease process ?Reviewed medications with patient and discussed importance of medication adherence ?Counseled on importance of regular laboratory monitoring as prescribed ?Discussed plans with patient for ongoing care management follow up and provided patient with direct contact information for care management team ?Provided patient with written educational materials related to hypo and hyperglycemia and importance of correct treatment ?Advised patient,  providing education and rationale, to check cbg 4 times a day and record, calling PCP for findings outside established parameters ?Encouraged to eat bedtime snack to help prevent hypoglycemia ?Lab Results  ?[place]

## 2021-11-07 ENCOUNTER — Other Ambulatory Visit: Payer: Self-pay | Admitting: Internal Medicine

## 2021-11-07 DIAGNOSIS — Z1231 Encounter for screening mammogram for malignant neoplasm of breast: Secondary | ICD-10-CM

## 2021-11-11 ENCOUNTER — Encounter: Payer: Self-pay | Admitting: Internal Medicine

## 2021-11-12 MED ORDER — ALBUTEROL SULFATE HFA 108 (90 BASE) MCG/ACT IN AERS: 2.0000 | INHALATION_SPRAY | Freq: Four times a day (QID) | RESPIRATORY_TRACT | 11 refills | Status: DC | PRN

## 2021-11-17 ENCOUNTER — Ambulatory Visit: Payer: Medicare HMO | Admitting: Internal Medicine

## 2021-11-17 ENCOUNTER — Encounter: Payer: Self-pay | Admitting: Internal Medicine

## 2021-11-17 VITALS — BP 160/78 | HR 66 | Temp 98.4°F | Ht 64.25 in | Wt 157.6 lb

## 2021-11-17 DIAGNOSIS — E1140 Type 2 diabetes mellitus with diabetic neuropathy, unspecified: Secondary | ICD-10-CM

## 2021-11-17 DIAGNOSIS — I10 Essential (primary) hypertension: Secondary | ICD-10-CM

## 2021-11-17 DIAGNOSIS — E7800 Pure hypercholesterolemia, unspecified: Secondary | ICD-10-CM | POA: Diagnosis not present

## 2021-11-17 DIAGNOSIS — Z794 Long term (current) use of insulin: Secondary | ICD-10-CM

## 2021-11-17 DIAGNOSIS — E0590 Thyrotoxicosis, unspecified without thyrotoxic crisis or storm: Secondary | ICD-10-CM

## 2021-11-17 LAB — POCT GLYCOSYLATED HEMOGLOBIN (HGB A1C): Hemoglobin A1C: 6.9 % — AB (ref 4.0–5.6)

## 2021-11-17 MED ORDER — OZEMPIC (0.25 OR 0.5 MG/DOSE) 2 MG/3ML ~~LOC~~ SOPN: 0.5000 mg | PEN_INJECTOR | SUBCUTANEOUS | 2 refills | Status: DC

## 2021-11-17 NOTE — Progress Notes (Signed)
? ?Subjective:  ?Patient ID: Maria Hinton, female    DOB: [birth year]  Age: 76 y.o. MRN: [id number] ? ?CC: The primary encounter diagnosis was HTN (hypertension), benign. Diagnoses of Type 2 diabetes mellitus with diabetic neuropathy, with long-term current use of insulin ([place]), Pure hypercholesterolemia, and Hyperthyroidism were also pertinent to this visit. ? ? ?HPI ?Maria Hinton presents for  ?Chief Complaint  ?Patient presents with  ? Follow-up  ?  3 month follow up   ? ?1) T2DM  UNCONTROLLED BY FEBRUARY A1C OF 9.1  t is not  exercising regularly or trying to lose weight. Checking  blood sugars ltwo to three times daily .  Having lows of 50 when she forgets to eat.  Once daily spikes to 246  after eating because she is eating mostly starches because they are less expensive.    Denies any recent hypoglyemic events.  Taking   METFORMIN.  OZEMPIC 0.25 MG  (DOSE  for the past 2 weeks,  had to be reduced due to lapse in medication [facility] 15 units as directed. Following a carbohydrate modified diet 6 days per week. Denies numbness, burning and tingling of extremities. Appetite is fair.  HSTORY OF SEVERE HYPGLYCEMIA WITH GLIPIZIDE 2 YEARS AGO.  Not exercising due to bilateral hip pain , lumbar spinal stenosis and fear of falling.   ? ?2) Back and hip pain:  taking tramadol twice daily  ,sometimes 3 times daily.  Aggravated by using weed eater  ? ?4) elevated blood pressure:  due to aggravation due to home issues.  (No A/C)  has not checked BP at home  ? ? ?Outpatient Medications Prior to Visit  ?Medication Sig Dispense Refill  ? albuterol (VENTOLIN HFA) 108 (90 Base) MCG/ACT inhaler Inhale 2 puffs into the lungs every 6 (six) hours as needed for wheezing. 6.7 g 11  ? aspirin EC 81 MG tablet Take 81 mg by mouth daily.    ? atenolol (TENORMIN) 50 MG tablet TAKE 1 TABLET BY MOUTH TWICE A DAY 180 tablet 1  ? atorvastatin (LIPITOR) 40 MG tablet TAKE 1 TABLET BY MOUTH DAILY AT 6 PM. 90 tablet 3  ? BD PEN NEEDLE NANO  2ND GEN 32G X 4 MM MISC USE DAILY AS DIRECTED WITH LEVEMIR 100 each 1  ? gabapentin (NEURONTIN) 300 MG capsule TAKE 1 CAPSULE BY MOUTH THREE TIMES A DAY 90 capsule 1  ? glucose blood (ONETOUCH VERIO) test strip Use as instructed to check blood sugars 3 times daily 100 strip 12  ? insulin detemir (LEVEMIR FLEXPEN) 100 UNIT/ML FlexPen Inject 16 Units into the skin daily. 15 mL 1  ? levothyroxine (SYNTHROID) 112 MCG tablet Take 1 tablet (112 mcg total) by mouth daily. 90 tablet 1  ? losartan (COZAAR) 50 MG tablet TAKE 1 TABLET BY MOUTH EVERY DAY 90 tablet 3  ? meloxicam (MOBIC) 7.5 MG tablet TAKE 1 TABLET BY MOUTH EVERY DAY 30 tablet 2  ? metFORMIN (GLUCOPHAGE) 1000 MG tablet Take 1 tablet (1,000 mg total) by mouth 2 (two) times daily with a meal. 180 tablet 1  ? omeprazole (PRILOSEC) 40 MG capsule TAKE 1 CAPSULE BY MOUTH EVERY DAY 90 capsule 1  ? OneTouch Delica Lancets 61H MISC 1 application by Does not apply route 2 (two) times daily. Use to check blood sugar up to two times daily. E11.69 100 each 11  ? temazepam (RESTORIL) 15 MG capsule TAKE 1 CAPSULE BY MOUTH AT BEDTIME AS NEEDED FOR SLEEP. [place]  capsule 5  ? traMADol (ULTRAM) 50 MG tablet TAKE 1 TABLET BY MOUTH EVERY 6 HOURS AS NEEDED FOR musculoskeletal  PAIN 120 tablet 5  ? vitamin B-12 (CYANOCOBALAMIN) 500 MCG tablet Take 500 mcg by mouth daily.    ? Semaglutide,0.25 or 0.5MG/DOS, (OZEMPIC, 0.25 OR 0.5 MG/DOSE,) 2 MG/3ML SOPN Inject 0.25 mg into the skin once a week. 3 mL 2  ? ?No facility-administered medications prior to visit.  ? ? ?Review of Systems; ? ?Patient denies headache, fevers, malaise, unintentional weight loss, skin rash, eye pain, sinus congestion and sinus pain, sore throat, dysphagia,  hemoptysis , cough, dyspnea, wheezing, chest pain, palpitations, orthopnea, edema, abdominal pain, nausea, melena, diarrhea, constipation, flank pain, dysuria, hematuria, urinary  Frequency, nocturia, numbness, tingling, seizures,  Focal weakness, Loss of  consciousness,  Tremor, insomnia, depression, anxiety, and suicidal ideation.   ? ? ? ?Objective:  ?BP (!) 160/78 (BP Location: Left Arm, Patient Position: Sitting, Cuff Size: Large)   Pulse 66   Temp 98.4 ?F (36.9 ?C) (Oral)   Ht 5' 4.25" (1.632 m)   Wt 157 lb 9.6 oz (71.5 kg)   SpO2 97%   BMI 26.84 kg/m?  ? ?BP Readings from Last 3 Encounters:  ?11/17/21 (!) 160/78  ?08/18/21 (!) 152/76  ?05/22/21 138/70  ? ? ?Wt Readings from Last 3 Encounters:  ?11/17/21 157 lb 9.6 oz (71.5 kg)  ?09/05/21 157 lb (71.2 kg)  ?08/18/21 163 lb 3.2 oz (74 kg)  ? ? ?General appearance: alert, cooperative and appears stated age ?Ears: normal TM's and external ear canals both ears ?Throat: lips, mucosa, and tongue normal; teeth and gums normal ?Neck: no adenopathy, no carotid bruit, supple, symmetrical, trachea midline and thyroid not enlarged, symmetric, no tenderness/mass/nodules ?Back: symmetric, no curvature. ROM normal. No CVA tenderness. ?Lungs: clear to auscultation bilaterally ?Heart: regular rate and rhythm, S1, S2 normal, no murmur, click, rub or gallop ?Abdomen: soft, non-tender; bowel sounds normal; no masses,  no organomegaly ?Pulses: 2+ and symmetric ?Skin: Skin color, texture, turgor normal. No rashes or lesions ?Lymph nodes: Cervical, supraclavicular, and axillary nodes normal. ? ?Lab Results  ?Component Value Date  ? HGBA1C 6.9 (A) 11/17/2021  ? HGBA1C 9.1 (H) 08/18/2021  ? HGBA1C 6.6 (H) 03/20/2021  ? ? ?Lab Results  ?Component Value Date  ? CREATININE 1.00 08/18/2021  ? CREATININE 0.87 03/24/2021  ? CREATININE 0.77 03/20/2021  ? ? ?Lab Results  ?Component Value Date  ? WBC 6.4 04/21/2018  ? HGB 14.5 04/21/2018  ? HCT 42.7 04/21/2018  ? PLT 324.0 04/21/2018  ? GLUCOSE 189 (H) 08/18/2021  ? CHOL 192 08/18/2021  ? TRIG 276.0 (H) 08/18/2021  ? HDL 56.70 08/18/2021  ? LDLDIRECT 105.0 08/18/2021  ? [place] 43 03/20/2021  ? ALT 14 08/18/2021  ? AST 18 08/18/2021  ? NA 138 08/18/2021  ? K 4.9 08/18/2021  ? CL 101  08/18/2021  ? CREATININE 1.00 08/18/2021  ? BUN 19 08/18/2021  ? CO2 29 08/18/2021  ? TSH 2.70 05/14/2021  ? INR 0.88 02/17/2018  ? HGBA1C 6.9 (A) 11/17/2021  ? MICROALBUR 1.8 02/20/2020  ? ? ?MM 3D SCREEN BREAST BILATERAL ? ?Result Date: 09/24/2020 ?CLINICAL DATA:  Screening. EXAM: DIGITAL SCREENING BILATERAL MAMMOGRAM WITH TOMOSYNTHESIS AND CAD TECHNIQUE: Bilateral screening digital craniocaudal and mediolateral oblique mammograms were obtained. Bilateral screening digital breast tomosynthesis was performed. The images were evaluated with computer-aided detection. COMPARISON:  Previous exam(s). ACR Breast Density Category b: There are scattered areas of fibroglandular density. FINDINGS: There  are no findings suspicious for malignancy. The images were evaluated with computer-aided detection. IMPRESSION: No mammographic evidence of malignancy. A result letter of this screening mammogram will be mailed directly to the patient. RECOMMENDATION: Screening mammogram in one year. (Code:SM-B-01Y) BI-RADS CATEGORY  1: Negative. Electronically Signed   By: Claudie Revering M.D.   On: 09/24/2020 16:05  ? ? ?Assessment & Plan:  ? ?Problem List Items Addressed This Visit   ? ? Hyperthyroidism  ? Relevant Orders  ? TSH  ? HLD (hyperlipidemia)  ? Relevant Orders  ? Direct LDL  ? Lipid Profile  ? HTN (hypertension), benign - Primary  ?  Not at goal,  Not checking at home.  Taking atenolol and losartan .  Needs RN visit to recheck bp.  ? ?  ?  ? Relevant Orders  ? Comp Met (CMET)  ? Type 2 diabetes mellitus with diabetic neuropathy, unspecified ([place])  ?  Improved control on current regimen of tresiba 17 units metformin 1000 mg.  And ozempic 0.25 mg increaes ozempic to 0.5 mg foot exam abnormal and documented continue statin ? ?  ?  ? Relevant Medications  ? Semaglutide,0.25 or 0.5MG/DOS, (OZEMPIC, 0.25 OR 0.5 MG/DOSE,) 2 MG/3ML SOPN  ? Other Relevant Orders  ? Comp Met (CMET)  ? POCT HgB A1C (Completed)  ? ? ?I spent a total of  27  minutes with this patient in a face to face visit on the date of this encounter reviewing the last office visit in January,  her  diet and eating habits, home blood sugar readings,   and post visit ordering of testing and ther

## 2021-11-17 NOTE — Assessment & Plan Note (Signed)
Improved control on current regimen of tresiba 17 units metformin 1000 mg.  And ozempic 0.25 mg increaes ozempic to 0.5 mg foot exam abnormal and documented continue statin ?

## 2021-11-17 NOTE — Patient Instructions (Addendum)
Your a1c is now under control!  ? ?Please don't forget to eat .  reduce your starches and increase your intake of vegetables and lean proteins (fish, chicken) WHEN YOU CAN ? ?INCREASE OZEMPIC TO 0.5 MG  USING THE PENS YOU HAVE  ? ? ?Please try to start walking daily for exercise.  Use the daytime tramadol if you need to ? ? ?Please go get your BP at your local pharmacy  ; let me know the readings  ?

## 2021-11-17 NOTE — Assessment & Plan Note (Signed)
Not at goal,  Not checking at home.  Taking atenolol and losartan .  Needs RN visit to recheck bp.  ?

## 2021-11-18 LAB — LIPID PANEL
Cholesterol: 203 mg/dL — ABNORMAL HIGH (ref 0–200)
HDL: 51.7 mg/dL (ref >39.00)
LDL Cholesterol: 111 mg/dL — ABNORMAL HIGH (ref 0–99)
NonHDL: 150.98
Total CHOL/HDL Ratio: 4
Triglycerides: 198 mg/dL — ABNORMAL HIGH (ref 0.0–149.0)
VLDL: 39.6 mg/dL (ref 0.0–40.0)

## 2021-11-18 LAB — COMPREHENSIVE METABOLIC PANEL
ALT: 10 U/L (ref 0–35)
AST: 15 U/L (ref 0–37)
Albumin: 4.6 g/dL (ref 3.5–5.2)
Alkaline Phosphatase: 95 U/L (ref 39–117)
BUN: 11 mg/dL (ref 6–23)
CO2: 28 mEq/L (ref 19–32)
Calcium: 9.2 mg/dL (ref 8.4–10.5)
Chloride: 99 mEq/L (ref 96–112)
Creatinine, Ser: 0.84 mg/dL (ref 0.40–1.20)
GFR: 68.77 mL/min (ref >60.00)
Glucose, Bld: 86 mg/dL (ref 70–99)
Potassium: 4.7 mEq/L (ref 3.5–5.1)
Sodium: 138 mEq/L (ref 135–145)
Total Bilirubin: 0.5 mg/dL (ref 0.2–1.2)
Total Protein: 7.1 g/dL (ref 6.0–8.3)

## 2021-11-18 LAB — TSH: TSH: 1.75 u[IU]/mL (ref 0.35–5.50)

## 2021-11-18 LAB — LDL CHOLESTEROL, DIRECT: Direct LDL: 121 mg/dL

## 2021-11-24 ENCOUNTER — Ambulatory Visit (INDEPENDENT_AMBULATORY_CARE_PROVIDER_SITE_OTHER): Payer: Medicare HMO | Admitting: *Deleted

## 2021-11-24 DIAGNOSIS — E1140 Type 2 diabetes mellitus with diabetic neuropathy, unspecified: Secondary | ICD-10-CM

## 2021-11-24 DIAGNOSIS — I10 Essential (primary) hypertension: Secondary | ICD-10-CM

## 2021-11-24 NOTE — Patient Instructions (Signed)
Visit Information  Thank you for taking time to visit with me today. Please don't hesitate to contact me if I can be of assistance to you before our next scheduled telephone appointment.  Following are the goals we discussed today:  Take all medications as prescribed Attend all scheduled provider appointments Call provider office for new concerns or questions  check blood sugar at prescribed times: 4 times daily and when you have symptoms of low or high blood sugar set a realistic goal wear comfortable, cotton socks wear comfortable, well-fitting shoes check blood pressure 3 times per week learn about high blood pressure keep a blood pressure log take blood pressure log to all doctor appointments keep all doctor appointments eat more whole grains, fruits and vegetables, lean meats and healthy fats  Our next appointment is by telephone on 6/30 at 1000  Please call the care guide team at [phone] if you need to cancel or reschedule your appointment.   If you are experiencing a Mental Health or [place] or need someone to talk to, please call the Suicide and Crisis Lifeline: 988 call the [place] National Suicide Prevention Lifeline: 640-106-1655 or TTY: 818-650-3586 TTY (816)129-3438) to talk to a trained counselor call 1-800-273-TALK (toll free, 24 hour hotline) call 911   Patient verbalizes understanding of instructions and care plan provided today and agrees to view in [place]. Active MyChart status and patient understanding of how to access instructions and care plan via MyChart confirmed with patient.     The care management team will reach out to the patient again over the next 30 days.   Hubert Azure RN, MSN RN Care Management Coordinator [place] [phone] [email or web address]

## 2021-11-25 NOTE — Chronic Care Management (AMB) (Signed)
Chronic Care Management   CCM RN Visit Note  11/25/2021 Name: Maria Hinton MRN: [id number] DOB: [birth year]  Subjective: Maria Hinton is a 76 y.o. year old female who is a primary care patient of Crecencio Mc, MD. The care management team was consulted for assistance with disease management and care coordination needs.    Engaged with patient by telephone for follow up visit in response to provider referral for case management and/or care coordination services.   Consent to Services:  The patient was given information about Chronic Care Management services, agreed to services, and gave verbal consent prior to initiation of services.  Please see initial visit note for detailed documentation.   Patient agreed to services and verbal consent obtained.   Assessment: Review of patient past medical history, allergies, medications, health status, including review of consultants reports, laboratory and other test data, was performed as part of comprehensive evaluation and provision of chronic care management services.   SDOH (Social Determinants of Health) assessments and interventions performed:    CCM Care Plan  Allergies  Allergen Reactions   Vicodin [Hydrocodone-Acetaminophen] Other (See Comments)    Insomnia    Acetaminophen     Other reaction(s): Insomnia   Vicodin Hp [Hydrocodone-Acetaminophen]     Outpatient Encounter Medications as of 11/24/2021  Medication Sig   albuterol (VENTOLIN HFA) 108 (90 Base) MCG/ACT inhaler Inhale 2 puffs into the lungs every 6 (six) hours as needed for wheezing.   aspirin EC 81 MG tablet Take 81 mg by mouth daily.   atenolol (TENORMIN) 50 MG tablet TAKE 1 TABLET BY MOUTH TWICE A DAY   atorvastatin (LIPITOR) 40 MG tablet TAKE 1 TABLET BY MOUTH DAILY AT 6 PM.   BD PEN NEEDLE NANO 2ND GEN 32G X 4 MM MISC USE DAILY AS DIRECTED WITH LEVEMIR   gabapentin (NEURONTIN) 300 MG capsule TAKE 1 CAPSULE BY MOUTH THREE TIMES A DAY   glucose blood (ONETOUCH  VERIO) test strip Use as instructed to check blood sugars 3 times daily   insulin detemir (LEVEMIR FLEXPEN) 100 UNIT/ML FlexPen Inject 16 Units into the skin daily.   levothyroxine (SYNTHROID) 112 MCG tablet Take 1 tablet (112 mcg total) by mouth daily.   losartan (COZAAR) 50 MG tablet TAKE 1 TABLET BY MOUTH EVERY DAY   meloxicam (MOBIC) 7.5 MG tablet TAKE 1 TABLET BY MOUTH EVERY DAY   metFORMIN (GLUCOPHAGE) 1000 MG tablet Take 1 tablet (1,000 mg total) by mouth 2 (two) times daily with a meal.   omeprazole (PRILOSEC) 40 MG capsule TAKE 1 CAPSULE BY MOUTH EVERY DAY   OneTouch Delica Lancets 30N MISC 1 application by Does not apply route 2 (two) times daily. Use to check blood sugar up to two times daily. E11.69   Semaglutide,0.25 or 0.5MG/DOS, (OZEMPIC, 0.25 OR 0.5 MG/DOSE,) 2 MG/3ML SOPN Inject 0.5 mg into the skin once a week.   temazepam (RESTORIL) 15 MG capsule TAKE 1 CAPSULE BY MOUTH AT BEDTIME AS NEEDED FOR SLEEP.   traMADol (ULTRAM) 50 MG tablet TAKE 1 TABLET BY MOUTH EVERY 6 HOURS AS NEEDED FOR musculoskeletal  PAIN   vitamin B-12 (CYANOCOBALAMIN) 500 MCG tablet Take 500 mcg by mouth daily.   No facility-administered encounter medications on file as of 11/24/2021.    Patient Active Problem List   Diagnosis Date Noted   Bicipital tendonitis of right shoulder 08/04/2021   Arthritis of right acromioclavicular joint 08/04/2021   Hyperkalemia 03/26/2021   Chronic right shoulder pain 02/16/2021  Left hip pain 02/16/2021   Major depressive disorder with current active episode 08/08/2020   Tubular adenoma of colon 08/07/2020   History of colonic polyps    Cecal polyp    Polyp of ascending colon    Hypothyroidism, postradioiodine therapy 05/06/2020   Need for immunization against influenza 04/22/2020   B12 deficiency 02/21/2020   Vertigo 10/10/2019   Joint pain in both hands 10/10/2019   Benign positional vertigo 09/08/2019   Orthostasis 09/08/2019   Cervical spondylosis with  radiculopathy 09/08/2019   Adhesive capsulitis of shoulder 05/01/2019   Displacement of lumbar intervertebral disc without myelopathy 05/01/2019   Osteoarthritis of spinal facet joint 05/01/2019   Primary localized osteoarthritis of pelvic region and thigh 05/01/2019   Type 2 diabetes mellitus with diabetic neuropathy, unspecified ([place]) 04/17/2019   Hyperthyroidism 10/27/2018   Elevated alkaline phosphatase measurement 11/12/2017   Elevated liver enzymes 10/10/2017   Esophagitis    Columnar-lined esophagus    Gastric polyp    Hiatal hernia    Weakness 08/26/2017   Dysphagia 05/24/2017   Back pain 05/24/2017   Skin neoplasm 06/06/2016   Venous (peripheral) insufficiency 06/06/2016   Insomnia secondary to anxiety 12/12/2015   History of lumbar laminectomy for spinal cord decompression 08/18/2015   Extrinsic asthma, unspecified 02/21/2014   History of tobacco abuse 02/21/2014   Screening for osteoporosis 02/09/2014   Pulmonary disease 02/09/2014   Encounter for smoking cessation counseling 04/27/2013   Arthritis 04/27/2013   HTN (hypertension), benign 04/11/2013   HLD (hyperlipidemia) 04/11/2013   Anemia 06/13/2012    Conditions to be addressed/monitored:HTN and DMII  Care Plan : [name]  Updates made by Leona Singleton, RN since 11/25/2021 12:00 AM     Problem: KNOWLEDGE DEFICIT RELATED TO SELF CARE MANAGEMENT OF CHRONIC MEDICAL CONDITIONS   Priority: Medium     Long-Range Goal: PATIENT WILL WORK WITH CCM TEAM TO GAIN KNOWLEDGE TO BETTER SELF MANAGE CHRONIC CONDITIONS   Start Date: 10/27/2021  Expected End Date: 10/26/2022  Note:   Current Barriers:  Knowledge Deficits related to plan of care for management of HTN and DMII  Financial Constraints  4/24--States she feels well.  Wears continuous CBG meter.  Fasting blood sugar was 69 and 139 after treatment.  Frequent hypoglycemic episodes in mornings; then spikes n the afternoons  Discussed and  encouraged bedtime snack.  Has not been monitoring home blood pressures. 5/22--REPORTS doing well.  Happy about decrease in A1C.  Fasting blood sugar 104 with a few episodes of hypoglycemia in the 50's.  Denies anymore recent falls; agrees to start monitoring blood pressures.  States she plans to move in with her son and daughter in law next month e RNCM Clinical Goal(s):  Patient will demonstrate Improved adherence to prescribed treatment plan for HTN and DMII as evidenced by DECREASING HGB A1C BY 0.3 POINTS AND. CHECKING BPs FEW TIMES A WEEK  through collaboration with RN Care manager, provider, and care team.   Interventions: 1:1 collaboration with primary care provider regarding development and update of comprehensive plan of care as evidenced by provider attestation and co-signature Inter-disciplinary care team collaboration (see longitudinal plan of care) Evaluation of current treatment plan related to  self management and patient's adherence to plan as established by provider   Diabetes Interventions:  (Status:  Goal on track:  Yes.) Long Term Goal Assessed patient's understanding of A1c goal: <7% Provided education to patient about basic DM disease process Reviewed medications with  patient and discussed importance of medication adherence Counseled on importance of regular laboratory monitoring as prescribed Discussed plans with patient for ongoing care management follow up and provided patient with direct contact information for care management team Provided patient with written educational materials related to hypo and hyperglycemia and importance of correct treatment Advised patient, providing education and rationale, to check cbg 4 times a day and record, calling PCP for findings outside established parameters Encouraged to eat bedtime snack to help prevent hypoglycemia Discussed keeping hard candy and/or snack to treat hypoglycemia Lab Results  Component Value Date   HGBA1C 6.9  (A) 11/17/2021   Hypertension Interventions:  (Status:  Goal on track:  Yes.) Long Term Goal Last practice recorded BP readings:  BP Readings from Last 3 Encounters:  11/17/21 (!) 160/78  08/18/21 (!) 152/76  05/22/21 138/70  Most recent eGFR/CrCl: No results found for: EGFR  No components found for: CRCL  Evaluation of current treatment plan related to hypertension self management and patient's adherence to plan as established by provider Provided education to patient re: stroke prevention, s/s of heart attack and stroke Reviewed medications with patient and discussed importance of compliance Advised patient, providing education and rationale, to monitor blood pressure daily and record, calling PCP for findings outside established parameters Provided education on prescribed diet low salt carb modified Discussed complications of poorly controlled blood pressure such as heart disease, stroke, circulatory complications, vision complications, kidney impairment, sexual dysfunction  Patient Goals/Self-Care Activities: Take all medications as prescribed Attend all scheduled provider appointments Call provider office for new concerns or questions  check blood sugar at prescribed times: 4 times daily and when you have symptoms of low or high blood sugar set a realistic goal wear comfortable, cotton socks wear comfortable, well-fitting shoes check blood pressure 3 times per week learn about high blood pressure keep a blood pressure log take blood pressure log to all doctor appointments keep all doctor appointments eat more whole grains, fruits and vegetables, lean meats and healthy fats  Follow Up Plan:  The care management team will reach out to the patient again over the next 45 days.      Plan:The care management team will reach out to the patient again over the next 45 days.  Hubert Azure RN, MSN RN Care Management Coordinator [place] [phone] [email or web address]

## 2021-12-03 DIAGNOSIS — I10 Essential (primary) hypertension: Secondary | ICD-10-CM

## 2021-12-03 DIAGNOSIS — E1159 Type 2 diabetes mellitus with other circulatory complications: Secondary | ICD-10-CM

## 2021-12-03 DIAGNOSIS — Z87891 Personal history of nicotine dependence: Secondary | ICD-10-CM | POA: Diagnosis not present

## 2021-12-03 DIAGNOSIS — Z794 Long term (current) use of insulin: Secondary | ICD-10-CM

## 2021-12-04 ENCOUNTER — Ambulatory Visit
Admission: RE | Admit: 2021-12-04 | Discharge: 2021-12-04 | Disposition: A | Discharge status: Home / Self Care | Payer: Medicare HMO | Source: Ambulatory Visit | Attending: Internal Medicine | Admitting: Internal Medicine

## 2021-12-04 DIAGNOSIS — Z1231 Encounter for screening mammogram for malignant neoplasm of breast: Secondary | ICD-10-CM | POA: Diagnosis not present

## 2021-12-13 ENCOUNTER — Other Ambulatory Visit: Payer: Self-pay | Admitting: Internal Medicine

## 2021-12-19 ENCOUNTER — Other Ambulatory Visit: Payer: Self-pay | Admitting: Family

## 2022-01-02 ENCOUNTER — Ambulatory Visit (INDEPENDENT_AMBULATORY_CARE_PROVIDER_SITE_OTHER): Payer: Medicare HMO | Admitting: *Deleted

## 2022-01-02 DIAGNOSIS — I10 Essential (primary) hypertension: Secondary | ICD-10-CM

## 2022-01-02 DIAGNOSIS — Z794 Long term (current) use of insulin: Secondary | ICD-10-CM

## 2022-01-02 DIAGNOSIS — E1140 Type 2 diabetes mellitus with diabetic neuropathy, unspecified: Secondary | ICD-10-CM

## 2022-01-02 NOTE — Patient Instructions (Signed)
CONGRATULATIONS ON COMPLETING YOUR GOALS.  IT AS BEEN A PLEASURE WORKING WITH AND TALKING TO YOU.  IF  NEEDS [place] IN THE FUTURE PLEASE DO NOT HESITATE TO CONTACT ME  [phone]    RN, MSN RN Care Management Coordinator [facility] [phone] [email or web address]  

## 2022-01-02 NOTE — Chronic Care Management (AMB) (Signed)
  Care Management   Follow Up Note   01/02/2022 Name: Maria Hinton MRN: [id number] DOB: 1946/03/21   Referred by: Crecencio Mc, MD Reason for referral : Case Closure   Successful outreach to patient.  States she is doing well without complaints.  Discussed goals and both agree patient has met goals of the program.  Follow Up Plan: The patient has been provided with contact information for the care management team and has been advised to call with any health-related questions or concerns.  No further follow up required: as personal goals have been met.  Hubert Azure RN, MSN RN Care Management Coordinator [place] [phone] [email or web address]

## 2022-02-11 LAB — HM DIABETES EYE EXAM

## 2022-02-16 ENCOUNTER — Other Ambulatory Visit: Payer: Self-pay | Admitting: Endocrinology

## 2022-02-16 DIAGNOSIS — E890 Postprocedural hypothyroidism: Secondary | ICD-10-CM

## 2022-02-18 ENCOUNTER — Encounter: Payer: Self-pay | Admitting: Family Medicine

## 2022-02-18 ENCOUNTER — Other Ambulatory Visit: Payer: Self-pay | Admitting: Internal Medicine

## 2022-02-18 ENCOUNTER — Ambulatory Visit (INDEPENDENT_AMBULATORY_CARE_PROVIDER_SITE_OTHER): Payer: Medicare HMO

## 2022-02-18 ENCOUNTER — Ambulatory Visit: Payer: Medicare HMO | Admitting: Family Medicine

## 2022-02-18 VITALS — BP 124/72 | Temp 98.0°F | Ht 64.25 in | Wt 140.6 lb

## 2022-02-18 DIAGNOSIS — G8929 Other chronic pain: Secondary | ICD-10-CM

## 2022-02-18 DIAGNOSIS — M25551 Pain in right hip: Secondary | ICD-10-CM

## 2022-02-18 DIAGNOSIS — R350 Frequency of micturition: Secondary | ICD-10-CM

## 2022-02-18 DIAGNOSIS — M25552 Pain in left hip: Secondary | ICD-10-CM | POA: Diagnosis not present

## 2022-02-18 DIAGNOSIS — M545 Low back pain, unspecified: Secondary | ICD-10-CM

## 2022-02-18 LAB — POCT URINALYSIS DIPSTICK (MANUAL)
Nitrite, UA: NEGATIVE
Poct Blood: NEGATIVE
Poct Glucose: NORMAL mg/dL
Poct Ketones: NEGATIVE
Poct Protein: 30 mg/dL — AB
Poct Urobilinogen: 1 mg/dL — AB
Spec Grav, UA: 1.025 (ref 1.010–1.025)
pH, UA: 6 (ref 5.0–8.0)

## 2022-02-18 MED ORDER — CEPHALEXIN 250 MG PO CAPS
250.0000 mg | ORAL_CAPSULE | Freq: Four times a day (QID) | ORAL | 0 refills | Status: AC
Start: 2022-02-18 — End: 2022-02-23

## 2022-02-18 NOTE — Progress Notes (Addendum)
    SUBJECTIVE:   CHIEF COMPLAINT / HPI: Back pain  Presents to clinic with complaint of lower back pain. Has history of chronic low back pain but felt that this was different pain.  Worsening pain started 1-2 months ago, mostly across lower back.  Does not have any radiation of pain down legs but reports pain in bilateral hips and bilateral legs.  Tramadol and Gabapentin helps back pain.    Urinary frequency Endorses urinary frequency, feels like not emptying bladder and having dark colored urine.  Denies any incontinence of bowel and bladder.  Denies any fevers, lower abdominal pain or pressure. No dysuria, hematuria or vaginal discharge.    PERTINENT  PMH / PSH:  Cervical spondylosis with radiculopathy Arthritis Spinal cord decompression status post lumbar laminectomy Left hip pain  OBJECTIVE:   BP 124/72 (BP Location: Left Arm, Patient Position: Sitting, Cuff Size: Normal)   Temp 98 F (36.7 C) (Oral)   Ht 5' 4.25" (1.632 m)   Wt 140 lb 9.6 oz (63.8 kg)   BMI 23.95 kg/m    General: Alert, no acute distress Cardio: Normal S1 and S2, RRR, no r/m/g Pulm: CTAB, normal work of breathing Abdomen: Bowel sounds normal. Abdomen soft and non-tender.  No CVA tenderness Back - Normal skin, Spine with normal alignment and no deformity.  No tenderness to vertebral process palpation.  Paraspinous muscles are not tender and without spasm.   Range of motion is full at neck and limited ROM at lumbar sacral regions secondary to pain. Hip Exam: No deformity. Limited ROM secondary to pain and left prosthetic with 4/5 strength. No tenderness to palpation. NVI distally. Negative logroll Positive FABER, and piriformis stretches.  Negative FADIR Bilateral SLR negative Lower Extremities: No peripheral edema. Sensation, motor and gait normal  Neuro: Cranial nerves grossly intact   ASSESSMENT/PLAN:   Low back pain Continues to have low back pain.  Likely worsening DJD.  Given frequency of  urination will check for UTI.  No red flags.  No recent imaging -Bilateral hip xray -L-Spine xray -Has tramadol at home can continue Tramadol 50 mg as needed -Offered PT, patient politely declined -Could consider pain management for evaluation of Neurostimulator if appropriate. -Follow up with PCP   Bilateral hip pain Worsening hip pain.  Left hip prosthetic. Doubt infectious given no fever.  Reports frequent falls, 1-2 monthly. -Bilateral hip xrays -Follow up with PCP  Urinary frequency Increased frequency.  No dysuria, CVA tenderness.  Frequent falls and worsening back pain. -Urine positive for mod leukocytes -Urine culture -Start Keflex 250 mg qid x 5 days -Probiotics daily while on antibiotics and continue for 10 days after completion. -If culture negative can discontinue antibiotics.    PDMP reviewed  Carollee Leitz, MD

## 2022-02-18 NOTE — Patient Instructions (Signed)
It was a pleasure meeting you today. Thank you for allowing me to take part in your health care.  Our goals for today as we discussed include:  For your hip and back pain I have ordered x-rays of your hips and back.  Continue tramadol and meloxicam as previously ordered. Heat and ice as needed. Can consider massage and PT referral once I have reviewed your x-rays.  For your urinary frequency Your urine showed some white blood cells.  This could be the beginnings of an infection and contributing to your back pain.  I have sent your urine for further testing. Start Keflex 250 mg.  Take 1 tablet 4 times a day, breakfast, lunch, dinner and before bed. Recommend also taking probiotics daily while on antibiotics and for at least 10 days after completing.  These can be purchased over-the-counter.  Also increase your consumption of yogurt.  Please follow-up with PCP if symptoms worsen  If you have any questions or concerns, please do not hesitate to call the office at [phone].  I look forward to our next visit and until then take care and stay safe.  Regards,   Carollee Leitz, MD   [facility]

## 2022-02-20 ENCOUNTER — Telehealth: Payer: Self-pay

## 2022-02-20 LAB — URINE CULTURE
MICRO NUMBER:: 13787861
SPECIMEN QUALITY:: ADEQUATE

## 2022-02-20 NOTE — Telephone Encounter (Signed)
Spoke to Patient about xray and lab results and Dr. Loistine Chance recommendations to stop the antibiotics and continue the probiotics. Patient verbalized understanding and is agreeable.

## 2022-02-20 NOTE — Progress Notes (Signed)
Spoke to Patient about xray and lab results and Dr. Loistine Chance recommendations to stop the antibiotics and continue the probiotics. Patient verbalized understanding and is agreeable

## 2022-02-20 NOTE — Progress Notes (Signed)
Spoke to Patient about xray and lab results and Dr. Loistine Chance recommendations to stop the antibiotics and continue the probiotics. Patient verbalized understanding and is agreeable.

## 2022-02-20 NOTE — Telephone Encounter (Signed)
-----   Message from Carollee Leitz, MD sent at 02/19/2022  6:44 PM EDT ----- Worsening arthritis of the lower spine.   Arthritis of the right hip that has unchanged from previous xray. The left hip replacement replacement is aligned.  Follow up with PCP if worsens

## 2022-02-22 ENCOUNTER — Encounter: Payer: Self-pay | Admitting: Family Medicine

## 2022-02-22 DIAGNOSIS — M25552 Pain in left hip: Secondary | ICD-10-CM | POA: Insufficient documentation

## 2022-02-22 DIAGNOSIS — M545 Low back pain, unspecified: Secondary | ICD-10-CM | POA: Insufficient documentation

## 2022-02-22 DIAGNOSIS — M25551 Pain in right hip: Secondary | ICD-10-CM | POA: Insufficient documentation

## 2022-02-22 DIAGNOSIS — R350 Frequency of micturition: Secondary | ICD-10-CM | POA: Insufficient documentation

## 2022-02-22 NOTE — Assessment & Plan Note (Signed)
Increased frequency.  No dysuria, CVA tenderness.  Frequent falls and worsening back pain. -Urine positive for mod leukocytes -Urine culture -Start Keflex 250 mg qid x 5 days -Probiotics daily while on antibiotics and continue for 10 days after completion. -If culture negative can discontinue antibiotics.

## 2022-02-22 NOTE — Assessment & Plan Note (Signed)
Worsening hip pain.  Left hip prosthetic. Doubt infectious given no fever.  Reports frequent falls, 1-2 monthly. -Bilateral hip xrays -Follow up with PCP

## 2022-02-22 NOTE — Assessment & Plan Note (Signed)
Continues to have low back pain.  Likely worsening DJD.  Given frequency of urination will check for UTI.  No red flags.  No recent imaging -Bilateral hip xray -L-Spine xray -Has tramadol at home can continue Tramadol 50 mg as needed -Offered PT, patient politely declined -Could consider pain management for evaluation of Neurostimulator if appropriate. -Follow up with PCP

## 2022-02-26 ENCOUNTER — Other Ambulatory Visit: Payer: Self-pay | Admitting: Internal Medicine

## 2022-03-20 ENCOUNTER — Encounter: Payer: Self-pay | Admitting: Internal Medicine

## 2022-03-20 ENCOUNTER — Ambulatory Visit: Payer: Medicare HMO | Admitting: Internal Medicine

## 2022-03-20 VITALS — BP 150/66 | HR 68 | Temp 98.0°F | Resp 14 | Ht 64.25 in | Wt 141.6 lb

## 2022-03-20 DIAGNOSIS — I10 Essential (primary) hypertension: Secondary | ICD-10-CM

## 2022-03-20 DIAGNOSIS — R350 Frequency of micturition: Secondary | ICD-10-CM

## 2022-03-20 DIAGNOSIS — Z23 Encounter for immunization: Secondary | ICD-10-CM

## 2022-03-20 DIAGNOSIS — E0590 Thyrotoxicosis, unspecified without thyrotoxic crisis or storm: Secondary | ICD-10-CM

## 2022-03-20 DIAGNOSIS — E785 Hyperlipidemia, unspecified: Secondary | ICD-10-CM

## 2022-03-20 DIAGNOSIS — E7800 Pure hypercholesterolemia, unspecified: Secondary | ICD-10-CM | POA: Diagnosis not present

## 2022-03-20 DIAGNOSIS — E039 Hypothyroidism, unspecified: Secondary | ICD-10-CM

## 2022-03-20 DIAGNOSIS — E1169 Type 2 diabetes mellitus with other specified complication: Secondary | ICD-10-CM

## 2022-03-20 DIAGNOSIS — E1140 Type 2 diabetes mellitus with diabetic neuropathy, unspecified: Secondary | ICD-10-CM | POA: Diagnosis not present

## 2022-03-20 DIAGNOSIS — Z794 Long term (current) use of insulin: Secondary | ICD-10-CM

## 2022-03-20 MED ORDER — GABAPENTIN 300 MG PO CAPS: 300.0000 mg | ORAL_CAPSULE | Freq: Four times a day (QID) | ORAL | 1 refills | Status: DC

## 2022-03-20 MED ORDER — RSVPREF3 VAC RECOMB ADJUVANTED 120 MCG/0.5ML IM SUSR: 0.5000 mL | Freq: Once | INTRAMUSCULAR | 0 refills | Status: AC

## 2022-03-20 NOTE — Patient Instructions (Addendum)
Check BP 3 times this coming week and send me readings  BECAUSE IT IS HIGH TODAY  ASK THE DIABETES SUPPLIER FOR THE FREESTYLE READER   For people  over 66 or for those who have a history COPD  the RSV vaccine is recommended. The office can provide the vaccine to anyone under the age of 72 who is not on Medicare.  [facility], [facility] and [facility] currently have the vaccine in stock

## 2022-03-20 NOTE — Progress Notes (Unsigned)
Subjective:  Patient ID: Maria Hinton, female    DOB: [birth year]  Age: 76 y.o. MRN: [id number]  CC: The primary encounter diagnosis was Need for immunization against influenza. Diagnoses of Hyperthyroidism, HTN (hypertension), benign, Type 2 diabetes mellitus with diabetic neuropathy, with long-term current use of insulin ([place]), Pure hypercholesterolemia, Urinary frequency, Hyperlipidemia associated with type 2 diabetes mellitus ([place]), and Acquired hypothyroidism were also pertinent to this visit.   HPI Maria Hinton presents for follow up on multiple issues.  Chief Complaint  Patient presents with   Follow-up    4 mon diabetes, denies any concerns or unusual pain.     T2DM:  She  feels generally well,  is walking regularly ; weight is at goal and stable  . Checking  blood sugars less than once daily at variable times, usually only if she feels she may be having a hypoglycemic event. .  BS have been under 130 fasting and < 150 post prandially. Highest CBG seen was 275 after eating spaghetti.  Using the [facility] 2  but has lost the reader  as of one week ago.  Does not have a smart phone  Denies any recent hypoglyemic events.  Taking  Levemir 16 units and metformin 1000 mg daily no longer taking Ozempic due to being a the donut hole.  . Following a carbohydrate modified diet 6 days per week. Denies numbness, burning and tingling of extremities. Appetite is good.    Hypertension: patient checks blood pressure one a week at the most   Readings have been for the most part > 140/80 at rest . MOst recent was 135/70  Patient is following a reduce salt diet most days and is taking medications as prescribed   HTN:  Patient is taking her medications as prescribed and notes no adverse effects.  Home BP readings have not  been done  She is avoiding added salt in her diet and walking regularly about 3 times per week for exercise  .   Hypothyroid:  taking levothyroxine since her ablation in Oct 2021    Outpatient Medications Prior to Visit  Medication Sig Dispense Refill   albuterol (VENTOLIN HFA) 108 (90 Base) MCG/ACT inhaler Inhale 2 puffs into the lungs every 6 (six) hours as needed for wheezing. 6.7 g 11   aspirin EC 81 MG tablet Take 81 mg by mouth daily.     atenolol (TENORMIN) 50 MG tablet TAKE 1 TABLET BY MOUTH TWICE A DAY 180 tablet 1   atorvastatin (LIPITOR) 40 MG tablet TAKE 1 TABLET BY MOUTH DAILY AT 6 PM. 90 tablet 3   BD PEN NEEDLE NANO 2ND GEN 32G X 4 MM MISC USE DAILY AS DIRECTED WITH LEVEMIR 100 each 1   glucose blood (ONETOUCH VERIO) test strip Use as instructed to check blood sugars 3 times daily 100 strip 12   insulin detemir (LEVEMIR FLEXPEN) 100 UNIT/ML FlexPen Inject 16 Units into the skin daily. 15 mL 1   losartan (COZAAR) 50 MG tablet TAKE 1 TABLET BY MOUTH EVERY DAY 90 tablet 3   meloxicam (MOBIC) 7.5 MG tablet TAKE 1 TABLET BY MOUTH EVERY DAY 30 tablet 2   metFORMIN (GLUCOPHAGE) 1000 MG tablet Take 1 tablet (1,000 mg total) by mouth 2 (two) times daily with a meal. 180 tablet 1   OneTouch Delica Lancets 27O MISC 1 application by Does not apply route 2 (two) times daily. Use to check blood sugar up to two times daily. E11.69 100 each  11   temazepam (RESTORIL) 15 MG capsule TAKE 1 CAPSULE BY MOUTH AT BEDTIME AS NEEDED FOR SLEEP 30 capsule 5   traMADol (ULTRAM) 50 MG tablet TAKE 1 TABLET BY MOUTH EVERY 6 HOURS AS NEEDED FOR musculoskeletal  PAIN 120 tablet 5   vitamin B-12 (CYANOCOBALAMIN) 500 MCG tablet Take 500 mcg by mouth daily.     gabapentin (NEURONTIN) 300 MG capsule TAKE 1 CAPSULE BY MOUTH THREE TIMES A DAY 90 capsule 1   levothyroxine (SYNTHROID) 112 MCG tablet TAKE 1 TABLET BY MOUTH EVERY DAY 90 tablet 2   Semaglutide,0.25 or 0.'5MG'$ /DOS, (OZEMPIC, 0.25 OR 0.5 MG/DOSE,) 2 MG/3ML SOPN Inject 0.5 mg into the skin once a week. (Patient not taking: Reported on 03/20/2022) 3 mL 2   No facility-administered medications prior to visit.    Review of  Systems;  Patient denies headache, fevers, malaise, unintentional weight loss, skin rash, eye pain, sinus congestion and sinus pain, sore throat, dysphagia,  hemoptysis , cough, dyspnea, wheezing, chest pain, palpitations, orthopnea, edema, abdominal pain, nausea, melena, diarrhea, constipation, flank pain, dysuria, hematuria, urinary  Frequency, nocturia, numbness, tingling, seizures,  Focal weakness, Loss of consciousness,  Tremor, insomnia, depression, anxiety, and suicidal ideation.      Objective:  BP (!) 150/66 (BP Location: Left Arm, Patient Position: Sitting, Cuff Size: Normal)   Pulse 68   Temp 98 F (36.7 C) (Oral)   Resp 14   Ht 5' 4.25" (1.632 m)   Wt 141 lb 9.6 oz (64.2 kg)   SpO2 97%   BMI 24.12 kg/m   BP Readings from Last 3 Encounters:  03/20/22 (!) 150/66  02/18/22 124/72  11/17/21 (!) 160/78    Wt Readings from Last 3 Encounters:  03/20/22 141 lb 9.6 oz (64.2 kg)  02/18/22 140 lb 9.6 oz (63.8 kg)  11/17/21 157 lb 9.6 oz (71.5 kg)    General appearance: alert, cooperative and appears stated age Ears: normal TM's and external ear canals both ears Throat: lips, mucosa, and tongue normal; teeth and gums normal Neck: no adenopathy, no carotid bruit, supple, symmetrical, trachea midline and thyroid not enlarged, symmetric, no tenderness/mass/nodules Back: symmetric, no curvature. ROM normal. No CVA tenderness. Lungs: clear to auscultation bilaterally Heart: regular rate and rhythm, S1, S2 normal, no murmur, click, rub or gallop Abdomen: soft, non-tender; bowel sounds normal; no masses,  no organomegaly Pulses: 2+ and symmetric Skin: Skin color, texture, turgor normal. No rashes or lesions Lymph nodes: Cervical, supraclavicular, and axillary nodes normal. Neuro:  awake and interactive with normal mood and affect. Higher cortical functions are normal. Speech is clear without word-finding difficulty or dysarthria. Extraocular movements are intact. Visual fields of  both eyes are grossly intact. Sensation to light touch is grossly intact bilaterally of upper and lower extremities. Motor examination shows 4+/5 symmetric hand grip and upper extremity and 5/5 lower extremity strength. There is no pronation or drift. Gait is non-ataxic   Lab Results  Component Value Date   HGBA1C 6.1 (H) 03/20/2022   HGBA1C 6.9 (A) 11/17/2021   HGBA1C 9.1 (H) 08/18/2021    Lab Results  Component Value Date   CREATININE 0.95 03/20/2022   CREATININE 0.84 11/17/2021   CREATININE 1.00 08/18/2021    Lab Results  Component Value Date   WBC 6.4 04/21/2018   HGB 14.5 04/21/2018   HCT 42.7 04/21/2018   PLT 324.0 04/21/2018   GLUCOSE 116 (H) 03/20/2022   CHOL 140 03/20/2022   TRIG 140 03/20/2022   HDL 53  03/20/2022   LDLDIRECT 74 03/20/2022   LDLCALC 65 03/20/2022   ALT 9 03/20/2022   AST 14 03/20/2022   NA 142 03/20/2022   K 4.6 03/20/2022   CL 103 03/20/2022   CREATININE 0.95 03/20/2022   BUN 17 03/20/2022   CO2 26 03/20/2022   TSH 0.07 (L) 03/20/2022   INR 0.88 02/17/2018   HGBA1C 6.1 (H) 03/20/2022   MICROALBUR 4.0 03/20/2022    MM 3D SCREEN BREAST BILATERAL  Result Date: 12/05/2021 CLINICAL DATA:  Screening. EXAM: DIGITAL SCREENING BILATERAL MAMMOGRAM WITH TOMOSYNTHESIS AND CAD TECHNIQUE: Bilateral screening digital craniocaudal and mediolateral oblique mammograms were obtained. Bilateral screening digital breast tomosynthesis was performed. The images were evaluated with computer-aided detection. COMPARISON:  Previous exam(s). ACR Breast Density Category b: There are scattered areas of fibroglandular density. FINDINGS: There are no findings suspicious for malignancy. IMPRESSION: No mammographic evidence of malignancy. A result letter of this screening mammogram will be mailed directly to the patient. RECOMMENDATION: Screening mammogram in one year. (Code:[id number]-B-01Y) BI-RADS CATEGORY  1: Negative. Electronically Signed   By: Dorise Bullion III M.D.   On:  12/05/2021 18:11    Assessment & Plan:   Problem List Items Addressed This Visit     Acquired hypothyroidism    Thyroid function is overactive on current dose of levothyroxine 112 mcg daily   Will lower dose to 100 mcg  And recheck in 6 weeks    Lab Results  Component Value Date   TSH 0.07 (L) 03/20/2022         Relevant Medications   levothyroxine (SYNTHROID) 100 MCG tablet   HTN (hypertension), benign    she reports compliance with medication regimen  but has an elevated reading today in office.  She is not using NSAIDs daily.  Discussed goal of 120/70  (130/80 for patients over 70)  to preserve renal function.  She has been asked to check her  BP  at home and  submit readings for evaluation. Renal function, electrolytes and screen for proteinuria are all normal       Hyperlipidemia associated with type 2 diabetes mellitus (HCC)    Tolerating lipitor .  Direct LDL is 74.Marland Kitchen  No changes   Lab Results  Component Value Date   CHOL 140 03/20/2022   HDL 53 03/20/2022   LDLCALC 65 03/20/2022   LDLDIRECT 74 03/20/2022   TRIG 140 03/20/2022   CHOLHDL 2.6 03/20/2022         Need for immunization against influenza - Primary   Relevant Orders   Flu Vaccine QUAD High Dose(Fluad) (Completed)   Type 2 diabetes mellitus with diabetic neuropathy, unspecified (HCC)    Currently  taking levemir 16 units and metformin 1000 with excellent results.  Weight has been stable after stopping ozempic .  She has no protienuria .  Taking gabapentin for neuropathy.  Adjusting dose for persistent symptoms. .    Lab Results  Component Value Date   HGBA1C 6.1 (H) 03/20/2022   Lab Results  Component Value Date   LABMICR 25.6 02/14/2021   MICROALBUR 4.0 03/20/2022   MICROALBUR 1.8 02/20/2020          Relevant Orders   COMPLETE METABOLIC PANEL WITH GFR (Completed)   Hemoglobin A1c (Completed)   Urine Microalbumin w/creat. ratio (Completed)   Urinary frequency    She was treated  empirically last month until the culture was indicative of only colonozation with GBS      Follow-up: Return in about 4  months (around 07/20/2022).   Crecencio Mc, MD

## 2022-03-21 LAB — MICROALBUMIN / CREATININE URINE RATIO
Creatinine, Urine: 131 mg/dL (ref 20–275)
Microalb Creat Ratio: 31 mcg/mg creat — ABNORMAL HIGH (ref <30)
Microalb, Ur: 4 mg/dL

## 2022-03-21 LAB — COMPLETE METABOLIC PANEL WITH GFR
AG Ratio: 1.6 (calc) (ref 1.0–2.5)
ALT: 9 U/L (ref 6–29)
AST: 14 U/L (ref 10–35)
Albumin: 4.1 g/dL (ref 3.6–5.1)
Alkaline phosphatase (APISO): 101 U/L (ref 37–153)
BUN: 17 mg/dL (ref 7–25)
CO2: 26 mmol/L (ref 20–32)
Calcium: 8 mg/dL — ABNORMAL LOW (ref 8.6–10.4)
Chloride: 103 mmol/L (ref 98–110)
Creat: 0.95 mg/dL (ref 0.60–1.00)
Globulin: 2.5 g/dL (calc) (ref 1.9–3.7)
Glucose, Bld: 116 mg/dL — ABNORMAL HIGH (ref 65–99)
Potassium: 4.6 mmol/L (ref 3.5–5.3)
Sodium: 142 mmol/L (ref 135–146)
Total Bilirubin: 0.3 mg/dL (ref 0.2–1.2)
Total Protein: 6.6 g/dL (ref 6.1–8.1)
eGFR: 63 mL/min/{1.73_m2} (ref >=60)

## 2022-03-21 LAB — LIPID PANEL
Cholesterol: 140 mg/dL (ref <200)
HDL: 53 mg/dL (ref >=50)
LDL Cholesterol (Calc): 65 mg/dL (calc)
Non-HDL Cholesterol (Calc): 87 mg/dL (calc) (ref <130)
Total CHOL/HDL Ratio: 2.6 (calc) (ref <5.0)
Triglycerides: 140 mg/dL (ref <150)

## 2022-03-21 LAB — LDL CHOLESTEROL, DIRECT: Direct LDL: 74 mg/dL (ref <100)

## 2022-03-21 LAB — HEMOGLOBIN A1C
Hgb A1c MFr Bld: 6.1 % of total Hgb — ABNORMAL HIGH (ref <5.7)
Mean Plasma Glucose: 128 mg/dL
eAG (mmol/L): 7.1 mmol/L

## 2022-03-21 LAB — TSH: TSH: 0.07 mIU/L — ABNORMAL LOW (ref 0.40–4.50)

## 2022-03-21 MED ORDER — LEVOTHYROXINE SODIUM 100 MCG PO TABS: 100.0000 ug | ORAL_TABLET | Freq: Every day | ORAL | 3 refills | Status: DC

## 2022-03-21 NOTE — Assessment & Plan Note (Addendum)
Thyroid function is overactive on current dose of levothyroxine 112 mcg daily   Will lower dose to 100 mcg  And recheck in 6 weeks    Lab Results  Component Value Date   TSH 0.07 (L) 03/20/2022

## 2022-03-21 NOTE — Assessment & Plan Note (Signed)
Currently  taking levemir 16 units and metformin 1000 with excellent results.  Weight has been stable after stopping ozempic .  She has no protienuria .  Taking gabapentin for neuropathy.  Adjusting dose for persistent symptoms. .    Lab Results  Component Value Date   HGBA1C 6.1 (H) 03/20/2022   Lab Results  Component Value Date   LABMICR 25.6 02/14/2021   MICROALBUR 4.0 03/20/2022   MICROALBUR 1.8 02/20/2020

## 2022-03-21 NOTE — Assessment & Plan Note (Signed)
she reports compliance with medication regimen  but has an elevated reading today in office.  She is not using NSAIDs daily.  Discussed goal of 120/70  (130/80 for patients over 70)  to preserve renal function.  She has been asked to check her  BP  at home and  submit readings for evaluation. Renal function, electrolytes and screen for proteinuria are all normal . 

## 2022-03-21 NOTE — Assessment & Plan Note (Signed)
She was treated empirically last month until the culture was indicative of only colonozation with GBS

## 2022-03-21 NOTE — Assessment & Plan Note (Signed)
Tolerating lipitor .  Direct LDL is 74.Marland Kitchen  No changes   Lab Results  Component Value Date   CHOL 140 03/20/2022   HDL 53 03/20/2022   LDLCALC 65 03/20/2022   LDLDIRECT 74 03/20/2022   TRIG 140 03/20/2022   CHOLHDL 2.6 03/20/2022

## 2022-03-30 ENCOUNTER — Other Ambulatory Visit: Payer: Self-pay | Admitting: Internal Medicine

## 2022-04-14 ENCOUNTER — Ambulatory Visit
Admission: EM | Admit: 2022-04-14 | Discharge: 2022-04-14 | Disposition: A | Discharge status: Home / Self Care | Payer: Medicare HMO | Attending: Family Medicine | Admitting: Family Medicine

## 2022-04-14 DIAGNOSIS — U071 COVID-19: Secondary | ICD-10-CM

## 2022-04-14 DIAGNOSIS — Z20822 Contact with and (suspected) exposure to covid-19: Secondary | ICD-10-CM

## 2022-04-14 MED ORDER — MOLNUPIRAVIR EUA 200MG CAPSULE: 4.0000 | ORAL_CAPSULE | Freq: Two times a day (BID) | ORAL | 0 refills | Status: AC

## 2022-04-14 NOTE — ED Triage Notes (Signed)
Pt c/o congestion, fever, SOB, headache, sore throat, Fatigue x4days  Pt has 2 covid positive tests.  Pt was around her son and daughter in law who had covid.   Pt was told to come in today by her Doctor for medication for covid.

## 2022-04-14 NOTE — Discharge Instructions (Addendum)
Your home test for COVID-19 was positive, meaning that you were infected with the novel coronavirus and could give the germ to others.  Please continue isolation at home for at least 5 days since the start of your symptoms. Once you complete your 5 day quarantine, you may return to normal activities as long as you've not had a fever for over 24 hours(without taking fever reducing medicine) and your symptoms are improving. Be sure to wear a mask until Day 11.   Please continue good preventive care measures, including:  frequent hand-washing, avoid touching your face, cover coughs/sneezes, stay out of crowds and keep a 6 foot distance from others.  Go to the nearest hospital emergency room if fever/cough/breathlessness are severe or illness seems like a threat to life.

## 2022-04-14 NOTE — ED Provider Notes (Signed)
[facility]    CSN: [id number] Arrival date & time: 04/14/22  1151      History   Chief Complaint Chief Complaint  Patient presents with   Covid Positive    HPI Maria Hinton is a 76 y.o. female.   HPI   Maria Hinton presents after testing positive for COVID at home. She started feeling fatigued about 4 days ago. Has headache, sore throat and congestion in the last couple of days. She has been sleeping more than she normally would. She is staying with a friend or hers.  Her son and daughter-in-law  tested positive for COVID and she lives with them.  Has history of COPD and asthma.  She has been using her rescue inhaler more frequently.  No recent fever, chills, abdominal pain, nausea, vomiting, diarrhea, headache.  Endorses myalgias and sore throat but this is improving.     Past Medical History:  Diagnosis Date   Actinic keratoses    follows with dermatology    Arthritis    Asthma    Chronic back pain    DDD,herniated disc/spondylosis/radiculopathy   COPD (chronic obstructive pulmonary disease) (HCC)    Diabetes mellitus    1980s   Early cataracts, bilateral    Dr. [name] [place]   GERD (gastroesophageal reflux disease)    takes Omeprazole daily   Hemorrhoids    History of colonic polyps    colonoscopy 04/2012 - Dr Candace Cruise   Hyperlipidemia    takes Pravastatin daily   Hypertension    takes Metoprolol/HCTZ daily   Hyperthyroidism    Insomnia    Joint pain    fingers   Joint swelling    Pneumonia    x 2 ;in Dec 2010/2011   PONV (postoperative nausea and vomiting)    Right leg weakness     Patient Active Problem List   Diagnosis Date Noted   Low back pain 02/22/2022   Bilateral hip pain 02/22/2022   Urinary frequency 02/22/2022   Bicipital tendonitis of right shoulder 08/04/2021   Arthritis of right acromioclavicular joint 08/04/2021   Hyperkalemia 03/26/2021   Chronic right shoulder pain 02/16/2021   Left hip pain 02/16/2021    Major depressive disorder with current active episode 08/08/2020   Tubular adenoma of colon 08/07/2020   History of colonic polyps    Cecal polyp    Polyp of ascending colon    Hypothyroidism, postradioiodine therapy 05/06/2020   Need for immunization against influenza 04/22/2020   B12 deficiency 02/21/2020   Vertigo 10/10/2019   Joint pain in both hands 10/10/2019   Benign positional vertigo 09/08/2019   Orthostasis 09/08/2019   Cervical spondylosis with radiculopathy 09/08/2019   Adhesive capsulitis of shoulder 05/01/2019   Displacement of lumbar intervertebral disc without myelopathy 05/01/2019   Osteoarthritis of spinal facet joint 05/01/2019   Primary localized osteoarthritis of pelvic region and thigh 05/01/2019   Type 2 diabetes mellitus with diabetic neuropathy, unspecified ([place]) 04/17/2019   Acquired hypothyroidism 10/27/2018   Elevated liver enzymes 10/10/2017   Esophagitis    Columnar-lined esophagus    Gastric polyp    Hiatal hernia    Weakness 08/26/2017   Skin neoplasm 06/06/2016   Venous (peripheral) insufficiency 06/06/2016   Insomnia secondary to anxiety 12/12/2015   History of lumbar laminectomy for spinal cord decompression 08/18/2015   Extrinsic asthma, unspecified 02/21/2014   History of tobacco abuse 02/21/2014   Screening for osteoporosis 02/09/2014   Pulmonary disease 02/09/2014   Encounter  for smoking cessation counseling 04/27/2013   Arthritis 04/27/2013   HTN (hypertension), benign 04/11/2013   Hyperlipidemia associated with type 2 diabetes mellitus ([place]) 04/11/2013    Past Surgical History:  Procedure Laterality Date   ABDOMINAL HYSTERECTOMY     at age 84   BACK SURGERY  20+yrs ago   CATARACT EXTRACTION W/PHACO Left 01/04/2019   Procedure: CATARACT EXTRACTION PHACO AND INTRAOCULAR LENS PLACEMENT ([place])  LEFT;  Surgeon: Leandrew Koyanagi, MD;  Location: [facility];  Service: Ophthalmology;  Laterality: Left;  GIVE IV ZOFRAN   CATARACT  EXTRACTION W/PHACO Right 02/01/2019   Procedure: CATARACT EXTRACTION PHACO AND INTRAOCULAR LENS PLACEMENT ([place])  RIGHT DIABETIC;  Surgeon: Leandrew Koyanagi, MD;  Location: [facility];  Service: Ophthalmology;  Laterality: Right;  DIABETIC GIVE IV ZOFRAN   COLONOSCOPY     COLONOSCOPY N/A 05/13/2015   Procedure: COLONOSCOPY;  Surgeon: Hulen Luster, MD;  Location: [facility];  Service: Gastroenterology;  Laterality: N/A;   COLONOSCOPY WITH PROPOFOL N/A 05/10/2020   Procedure: COLONOSCOPY WITH PROPOFOL;  Surgeon: Virgel Manifold, MD;  Location: [facility]COPY;  Service: Endoscopy;  Laterality: N/A;   ESOPHAGOGASTRODUODENOSCOPY  2011   ESOPHAGOGASTRODUODENOSCOPY (EGD) WITH PROPOFOL N/A 09/02/2017   Procedure: ESOPHAGOGASTRODUODENOSCOPY (EGD) WITH PROPOFOL;  Surgeon: Virgel Manifold, MD;  Location: [facility]COPY;  Service: Endoscopy;  Laterality: N/A;   FOOT SURGERY  2011   bunionectomy and knot removed from bottom of both feet   JOINT REPLACEMENT Left 2014   hip  Kraskinski   LUMBAR LAMINECTOMY/DECOMPRESSION MICRODISCECTOMY  09/10/2011   Procedure: LUMBAR LAMINECTOMY/DECOMPRESSION MICRODISCECTOMY 1 LEVEL;  Surgeon: Hosie Spangle, MD;  Location: [place];  Service: Neurosurgery;  Laterality: Right;  RIGHT Lumbar  Laminotomy and microdiskectomy Lumbar Four-Five   LUMBAR LAMINECTOMY/DECOMPRESSION MICRODISCECTOMY N/A 06/26/2015   Procedure: LUMBAR LAMINECTOMY/DECOMPRESSION MICRODISCECTOMY 1 LEVEL;  Surgeon: Jovita Gamma, MD;  Location: [place] [facility];  Service: Neurosurgery;  Laterality: N/A;  L3 and L4 Laminectomies   MELANOMA EXCISION  2011   on nose/back on neck   THORACIC DISC SURGERY  at age 49 and 66    mass of veins that had gotten into muscle and wrapped around rib;3 ribs also removed from left side   TONSILLECTOMY  as a child   and adenoids    TOTAL HIP ARTHROPLASTY Left 2014   Dr. Mack Guise   TUBAL LIGATION     VAGINA SURGERY     vaginal wall ruptured      OB History   No obstetric history on file.      Home Medications    Prior to Admission medications   Medication Sig Start Date End Date Taking? Authorizing Provider  albuterol (VENTOLIN HFA) 108 (90 Base) MCG/ACT inhaler Inhale 2 puffs into the lungs every 6 (six) hours as needed for wheezing. 11/12/21  Yes Crecencio Mc, MD  aspirin EC 81 MG tablet Take 81 mg by mouth daily.   Yes [provider]  atenolol (TENORMIN) 50 MG tablet TAKE 1 TABLET BY MOUTH TWICE A DAY 09/24/21  Yes Dutch Quint B, FNP  atorvastatin (LIPITOR) 40 MG tablet TAKE 1 TABLET BY MOUTH DAILY AT 6 PM. 09/08/21  Yes Dutch Quint B, FNP  BD PEN NEEDLE NANO 2ND GEN 32G X 4 MM MISC USE DAILY AS DIRECTED WITH LEVEMIR 05/31/21  Yes Crecencio Mc, MD  gabapentin (NEURONTIN) 300 MG capsule Take 1 capsule (300 mg total) by mouth 4 (four) times daily. 03/20/22  Yes Crecencio Mc,  MD  glucose blood (ONETOUCH VERIO) test strip Use as instructed to check blood sugars 3 times daily 02/14/21  Yes Crecencio Mc, MD  insulin detemir (LEVEMIR FLEXPEN) 100 UNIT/ML FlexPen Inject 16 Units into the skin daily. 09/03/21  Yes Crecencio Mc, MD  levothyroxine (SYNTHROID) 100 MCG tablet Take 1 tablet (100 mcg total) by mouth daily. 03/21/22  Yes Crecencio Mc, MD  losartan (COZAAR) 50 MG tablet TAKE 1 TABLET BY MOUTH EVERY DAY 12/15/21  Yes Crecencio Mc, MD  meloxicam (MOBIC) 7.5 MG tablet TAKE 1 TABLET BY MOUTH EVERY DAY 09/29/21  Yes Dutch Quint B, FNP  metFORMIN (GLUCOPHAGE) 1000 MG tablet Take 1 tablet (1,000 mg total) by mouth 2 (two) times daily with a meal. 08/25/21  Yes Crecencio Mc, MD  molnupiravir EUA (LAGEVRIO) 200 mg CAPS capsule Take 4 capsules (800 mg total) by mouth 2 (two) times daily for 5 days. 04/14/22 04/19/22 Yes , Ronnette Juniper, DO  OneTouch Delica Lancets 09U MISC 1 application by Does not apply route 2 (two) times daily. Use to check blood sugar up to two times daily. E11.69 04/18/19  Yes Crecencio Mc, MD  temazepam (RESTORIL) 15 MG capsule TAKE 1 CAPSULE BY MOUTH AT BEDTIME AS NEEDED FOR SLEEP 02/18/22  Yes Crecencio Mc, MD  traMADol (ULTRAM) 50 MG tablet Take 1 tablet (50 mg total) by mouth every 6 (six) hours as needed. TAKE 1 TABLET BY MOUTH EVERY 6 HOURS AS NEEDED FOR musculoskeletal  PAIN 03/30/22  Yes Crecencio Mc, MD  vitamin B-12 (CYANOCOBALAMIN) 500 MCG tablet Take 500 mcg by mouth daily.   Yes [provider]    Family History Family History  Problem Relation Age of Onset   Heart disease Brother        valve replacement   COPD Brother    COPD Mother        Emphysema   Diabetes Mother    Other Mother        Died in house fire at age 78.   Heart disease Father    Seizures Father    Cancer Father        Lung cancer with brain metastasis   Asthma Brother    COPD Son 13   Cancer Other        aunt ? maternal vs paternal side lung cancer    Diabetes Maternal Grandmother    Anesthesia problems Neg Hx    Hypotension Neg Hx    Malignant hyperthermia Neg Hx    Pseudochol deficiency Neg Hx    Thyroid disease Neg Hx    Breast cancer Neg Hx     Social History Social History   Tobacco Use   Smoking status: Former    Packs/day: 0.25    Years: 20.00    Total pack years: 5.00    Types: Cigarettes    Quit date: 2018    Years since quitting: 5.7   Smokeless tobacco: Never   Tobacco comments:    Pt quit 11/08/2014.Pt had quit smoking for 13 years before starting back in 2010; per 05/2017 visit quit smoking 2017 smoked total 25 years 1 ppd   Vaping Use   Vaping Use: Never used  Substance Use Topics   Alcohol use: No    Alcohol/week: 0.0 standard drinks of alcohol   Drug use: No     Allergies   Vicodin [hydrocodone-acetaminophen], Acetaminophen, and Vicodin hp [hydrocodone-acetaminophen]   Review of Systems Review of  Systems: negative unless otherwise stated in HPI.      Physical Exam Triage Vital Signs ED Triage Vitals  Enc Vitals Group      BP 04/14/22 1316 (!) 149/60     Pulse Rate 04/14/22 1316 62     Resp 04/14/22 1316 18     Temp 04/14/22 1316 98.2 F (36.8 C)     Temp Source 04/14/22 1316 Oral     SpO2 04/14/22 1316 94 %     Weight 04/14/22 1314 139 lb (63 kg)     Height 04/14/22 1314 '5\' 4"'$  (1.626 m)     Head Circumference --      Peak Flow --      Pain Score 04/14/22 1312 6     Pain Loc --      Pain Edu? --      Excl. in [place]? --    No data found.  Updated Vital Signs BP (!) 149/60 (BP Location: Left Arm)   Pulse 62   Temp 98.2 F (36.8 C) (Oral)   Resp 18   Ht '5\' 4"'$  (1.626 m)   Wt 63 kg   SpO2 94%   BMI 23.86 kg/m   Visual Acuity Right Eye Distance:   Left Eye Distance:   Bilateral Distance:    Right Eye Near:   Left Eye Near:    Bilateral Near:     Physical Exam GEN:     alert, non-toxic appearing female in no distress     HENT:  mucus membranes moist, oropharyngeal  without lesions or  exudate, no  tonsillar hypertrophy,  no oropharyngeal erythema, no nasal discharge EYES:   pupils equal and reactive, EOMi ,  no scleral injection NECK:  normal ROM   RESP:  no increased work of breathing, clear to auscultation bilaterally CVS:   regular rate  and rhythm Skin:   warm and dry, no rash on visible skin , normal  skin turgor    UC Treatments / Results  Labs (all labs ordered are listed, but only abnormal results are displayed) Labs Reviewed - No data to display  EKG   Radiology No results found.  Procedures Procedures (including critical care time)  Medications Ordered in UC Medications - No data to display     Initial Impression / Assessment and Plan / UC Course  I have reviewed the triage vital signs and the nursing notes.  Pertinent labs & imaging results that were available during my care of the patient were reviewed by me and considered in my medical decision making (see chart for details).       Pt is a 76 y.o. female who presents for a couple days of respiratory  symptoms. Amal is afebrile here without recent antipyretics. Satting well on room air. Overall pt is  well appearing, well hydrated, without respiratory distress. Pulmonary exam  is unremarkable. Home COVID testing positive x2 as pictured above. Has close contact COVID exposure with her son and daughter-in-law.  Will not repeat COVID testing here.  Pulmonary exam she has equal aeration bilaterally, imaging deferred.    She is interested in treatment for COVID. After shared decision making, she is interested in [place].  Rx sent to pharmacy.  Quarantine instructions provided.  ED and return precautions and understanding voiced. Discussed MDM, treatment plan and plan for follow-up with patient/parent who agrees with plan.      Final Clinical Impressions(s) / UC Diagnoses   Final diagnoses:  Close exposure to COVID-19 virus  Positive self-administered antigen test for COVID-19     Discharge Instructions      Your home test for COVID-19 was positive, meaning that you were infected with the novel coronavirus and could give the germ to others.  Please continue isolation at home for at least 5 days since the start of your symptoms. Once you complete your 5 day quarantine, you may return to normal activities as long as you've not had a fever for over 24 hours(without taking fever reducing medicine) and your symptoms are improving. Be sure to wear a mask until Day 11.   Please continue good preventive care measures, including:  frequent hand-washing, avoid touching your face, cover coughs/sneezes, stay out of crowds and keep a 6 foot distance from others.  Go to the nearest hospital emergency room if fever/cough/breathlessness are severe or illness seems like a threat to life.      ED Prescriptions     Medication Sig Dispense Auth. Provider   molnupiravir EUA (LAGEVRIO) 200 mg CAPS capsule Take 4 capsules (800 mg total) by mouth 2 (two) times daily for 5 days. 40 capsule , ,  DO      PDMP not reviewed this encounter.   Lyndee Hensen, DO 04/14/22 1343

## 2022-05-08 ENCOUNTER — Other Ambulatory Visit (INDEPENDENT_AMBULATORY_CARE_PROVIDER_SITE_OTHER): Payer: Medicare HMO

## 2022-05-08 DIAGNOSIS — E039 Hypothyroidism, unspecified: Secondary | ICD-10-CM

## 2022-05-08 LAB — TSH: TSH: 1.15 u[IU]/mL (ref 0.35–5.50)

## 2022-05-09 NOTE — Assessment & Plan Note (Signed)
Tsh NORMALIZED  on 100 MCG DAILY   Lab Results  Component Value Date   TSH 1.15 05/08/2022

## 2022-05-14 ENCOUNTER — Other Ambulatory Visit: Payer: Self-pay | Admitting: Endocrinology

## 2022-05-30 ENCOUNTER — Other Ambulatory Visit: Payer: Self-pay | Admitting: Internal Medicine

## 2022-06-09 ENCOUNTER — Other Ambulatory Visit: Payer: Self-pay | Admitting: Endocrinology

## 2022-06-09 ENCOUNTER — Other Ambulatory Visit: Payer: Self-pay | Admitting: Internal Medicine

## 2022-06-10 NOTE — Telephone Encounter (Signed)
I called patient to confirm if this medication was requested in error since it was removed in August. Pt does not need this medication.

## 2022-06-16 ENCOUNTER — Other Ambulatory Visit: Payer: Self-pay | Admitting: Internal Medicine

## 2022-06-24 ENCOUNTER — Other Ambulatory Visit: Payer: Self-pay | Admitting: Family

## 2022-07-01 ENCOUNTER — Encounter: Payer: Self-pay | Admitting: Internal Medicine

## 2022-07-02 ENCOUNTER — Other Ambulatory Visit: Payer: Self-pay

## 2022-07-02 MED ORDER — ATENOLOL 50 MG PO TABS: 50.0000 mg | ORAL_TABLET | Freq: Two times a day (BID) | ORAL | 1 refills | Status: DC

## 2022-08-24 ENCOUNTER — Other Ambulatory Visit: Payer: Self-pay | Admitting: Internal Medicine

## 2022-08-25 NOTE — Telephone Encounter (Signed)
Pt scheduled for 3/1 for DM follow up

## 2022-09-04 ENCOUNTER — Ambulatory Visit: Payer: Medicare HMO | Admitting: Internal Medicine

## 2022-09-04 ENCOUNTER — Encounter: Payer: Self-pay | Admitting: Internal Medicine

## 2022-09-04 VITALS — BP 162/84 | HR 66 | Temp 98.1°F | Ht 64.0 in | Wt 150.0 lb

## 2022-09-04 DIAGNOSIS — E785 Hyperlipidemia, unspecified: Secondary | ICD-10-CM

## 2022-09-04 DIAGNOSIS — R748 Abnormal levels of other serum enzymes: Secondary | ICD-10-CM

## 2022-09-04 DIAGNOSIS — I10 Essential (primary) hypertension: Secondary | ICD-10-CM

## 2022-09-04 DIAGNOSIS — F419 Anxiety disorder, unspecified: Secondary | ICD-10-CM

## 2022-09-04 DIAGNOSIS — E538 Deficiency of other specified B group vitamins: Secondary | ICD-10-CM

## 2022-09-04 DIAGNOSIS — F5105 Insomnia due to other mental disorder: Secondary | ICD-10-CM

## 2022-09-04 DIAGNOSIS — Z794 Long term (current) use of insulin: Secondary | ICD-10-CM

## 2022-09-04 DIAGNOSIS — E1169 Type 2 diabetes mellitus with other specified complication: Secondary | ICD-10-CM | POA: Diagnosis not present

## 2022-09-04 DIAGNOSIS — E1140 Type 2 diabetes mellitus with diabetic neuropathy, unspecified: Secondary | ICD-10-CM

## 2022-09-04 DIAGNOSIS — G4762 Sleep related leg cramps: Secondary | ICD-10-CM

## 2022-09-04 LAB — COMPREHENSIVE METABOLIC PANEL
ALT: 11 U/L (ref 0–35)
AST: 14 U/L (ref 0–37)
Albumin: 3.9 g/dL (ref 3.5–5.2)
Alkaline Phosphatase: 90 U/L (ref 39–117)
BUN: 14 mg/dL (ref 6–23)
CO2: 30 mEq/L (ref 19–32)
Calcium: 8.6 mg/dL (ref 8.4–10.5)
Chloride: 103 mEq/L (ref 96–112)
Creatinine, Ser: 0.83 mg/dL (ref 0.40–1.20)
GFR: 69.38 mL/min (ref >60.00)
Glucose, Bld: 112 mg/dL — ABNORMAL HIGH (ref 70–99)
Potassium: 4.6 mEq/L (ref 3.5–5.1)
Sodium: 142 mEq/L (ref 135–145)
Total Bilirubin: 0.5 mg/dL (ref 0.2–1.2)
Total Protein: 6.9 g/dL (ref 6.0–8.3)

## 2022-09-04 LAB — LIPID PANEL
Cholesterol: 121 mg/dL (ref 0–200)
HDL: 60.6 mg/dL (ref >39.00)
LDL Cholesterol: 44 mg/dL (ref 0–99)
NonHDL: 60.49
Total CHOL/HDL Ratio: 2
Triglycerides: 80 mg/dL (ref 0.0–149.0)
VLDL: 16 mg/dL (ref 0.0–40.0)

## 2022-09-04 LAB — CBC WITH DIFFERENTIAL/PLATELET
Basophils Absolute: 0.1 10*3/uL (ref 0.0–0.1)
Basophils Relative: 0.8 % (ref 0.0–3.0)
Eosinophils Absolute: 0.6 10*3/uL (ref 0.0–0.7)
Eosinophils Relative: 5.4 % — ABNORMAL HIGH (ref 0.0–5.0)
HCT: 36.7 % (ref 36.0–46.0)
Hemoglobin: 12.1 g/dL (ref 12.0–15.0)
Lymphocytes Relative: 15.6 % (ref 12.0–46.0)
Lymphs Abs: 1.7 10*3/uL (ref 0.7–4.0)
MCHC: 32.9 g/dL (ref 30.0–36.0)
MCV: 89.6 fl (ref 78.0–100.0)
Monocytes Absolute: 0.7 10*3/uL (ref 0.1–1.0)
Monocytes Relative: 6.2 % (ref 3.0–12.0)
Neutro Abs: 7.6 10*3/uL (ref 1.4–7.7)
Neutrophils Relative %: 72 % (ref 43.0–77.0)
Platelets: 365 10*3/uL (ref 150.0–400.0)
RBC: 4.09 Mil/uL (ref 3.87–5.11)
RDW: 15.9 % — ABNORMAL HIGH (ref 11.5–15.5)
WBC: 10.6 10*3/uL — ABNORMAL HIGH (ref 4.0–10.5)

## 2022-09-04 LAB — LDL CHOLESTEROL, DIRECT: Direct LDL: 47 mg/dL

## 2022-09-04 LAB — HEMOGLOBIN A1C: Hgb A1c MFr Bld: 8 % — ABNORMAL HIGH (ref 4.6–6.5)

## 2022-09-04 MED ORDER — ATORVASTATIN CALCIUM 40 MG PO TABS: 40.0000 mg | ORAL_TABLET | Freq: Every day | ORAL | 3 refills | Status: DC

## 2022-09-04 MED ORDER — METFORMIN HCL 1000 MG PO TABS: ORAL_TABLET | ORAL | 1 refills | Status: DC

## 2022-09-04 MED ORDER — ATENOLOL 50 MG PO TABS: 50.0000 mg | ORAL_TABLET | Freq: Two times a day (BID) | ORAL | 1 refills | Status: DC

## 2022-09-04 MED ORDER — ALBUTEROL SULFATE HFA 108 (90 BASE) MCG/ACT IN AERS: 2.0000 | INHALATION_SPRAY | Freq: Four times a day (QID) | RESPIRATORY_TRACT | 11 refills | Status: DC | PRN

## 2022-09-04 MED ORDER — LEVEMIR FLEXPEN 100 UNIT/ML ~~LOC~~ SOPN: 16.0000 [IU] | PEN_INJECTOR | Freq: Every day | SUBCUTANEOUS | 1 refills | Status: DC

## 2022-09-04 MED ORDER — GABAPENTIN 300 MG PO CAPS: 300.0000 mg | ORAL_CAPSULE | Freq: Four times a day (QID) | ORAL | 1 refills | Status: DC

## 2022-09-04 NOTE — Patient Instructions (Addendum)
You are due for you Medicare Annual Wellness visit around 09/06/2022, please schedule this  appointment at checkout.     Continue 16 units of Levemir for now and metformin.   You can prevent low blood sugars after midnight by drinking 4 ounces of milk at bedtime    One ounce of dill pickle juice can be used to prevent muscle cramps

## 2022-09-04 NOTE — Progress Notes (Unsigned)
Subjective:  Patient ID: Maria Hinton, female    DOB: [birth year]  Age: 77 y.o. MRN: [id number]  CC: The primary encounter diagnosis was HTN (hypertension), benign. Diagnoses of Type 2 diabetes mellitus with diabetic neuropathy, with long-term current use of insulin ([place]), Hyperlipidemia associated with type 2 diabetes mellitus ([place]), and B12 deficiency were also pertinent to this visit.   HPI Maria Hinton presents for  Chief Complaint  Patient presents with   Medical Management of Chronic Issues    Diabetes, hypertension, hyperlipidemia   1) T2DM: using FreeStyle Libre 2 . Had a low fo 55 yesterday pre dinner  due to fasting anorexic state. Taking 16 units basal qhs (Levemir)    2) [id number]: patient checks blood pressure twice weekly at home.  Readings have been for the most part <140/80 at rest . Has had a few highs during periods of emotional stress but rare. Patient is following a reduce salt diet most days and is taking medications as prescribed   3)   Lives with her dog,  her son and his family and pets  3 dogs a cat and a ferret (kept apart)   Outpatient Medications Prior to Visit  Medication Sig Dispense Refill   albuterol (VENTOLIN HFA) 108 (90 Base) MCG/ACT inhaler Inhale 2 puffs into the lungs every 6 (six) hours as needed for wheezing. 6.7 g 11   aspirin EC 81 MG tablet Take 81 mg by mouth daily.     atenolol (TENORMIN) 50 MG tablet Take 1 tablet (50 mg total) by mouth 2 (two) times daily. 180 tablet 1   atorvastatin (LIPITOR) 40 MG tablet TAKE 1 TABLET BY MOUTH DAILY AT 6 PM. 90 tablet 3   BD PEN NEEDLE NANO 2ND GEN 32G X 4 MM MISC USE DAILY AS DIRECTED WITH LEVEMIR 100 each 1   gabapentin (NEURONTIN) 300 MG capsule Take 1 capsule (300 mg total) by mouth 4 (four) times daily. 120 capsule 1   glucose blood (ONETOUCH VERIO) test strip Use as instructed to check blood sugars 3 times daily 100 strip 12   insulin detemir (LEVEMIR FLEXPEN) 100 UNIT/ML FlexPen  Inject 16 Units into the skin daily. 15 mL 1   levothyroxine (SYNTHROID) 100 MCG tablet Take 1 tablet (100 mcg total) by mouth daily. 90 tablet 3   losartan (COZAAR) 50 MG tablet TAKE 1 TABLET BY MOUTH EVERY DAY 90 tablet 3   metFORMIN (GLUCOPHAGE) 1000 MG tablet TAKE 1 TABLET (1,000 MG TOTAL) BY MOUTH TWICE A DAY WITH FOOD 180 tablet 1   omeprazole (PRILOSEC) 20 MG capsule Take 1 capsule by mouth daily.     OneTouch Delica Lancets 99991111 MISC 1 application by Does not apply route 2 (two) times daily. Use to check blood sugar up to two times daily. E11.69 100 each 11   temazepam (RESTORIL) 15 MG capsule TAKE 1 CAPSULE BY MOUTH AT BEDTIME AS NEEDED FOR SLEEP 30 capsule 0   traMADol (ULTRAM) 50 MG tablet Take 1 tablet (50 mg total) by mouth every 6 (six) hours as needed. TAKE 1 TABLET BY MOUTH EVERY 6 HOURS AS NEEDED FOR musculoskeletal  PAIN 90 tablet 5   vitamin B-12 (CYANOCOBALAMIN) 500 MCG tablet Take 500 mcg by mouth daily.     meloxicam (MOBIC) 7.5 MG tablet TAKE 1 TABLET BY MOUTH EVERY DAY (Patient not taking: Reported on 09/04/2022) 30 tablet 2   No facility-administered medications prior to visit.    Review of Systems;  Patient denies  headache, fevers, malaise, unintentional weight loss, skin rash, eye pain, sinus congestion and sinus pain, sore throat, dysphagia,  hemoptysis , cough, dyspnea, wheezing, chest pain, palpitations, orthopnea, edema, abdominal pain, nausea, melena, diarrhea, constipation, flank pain, dysuria, hematuria, urinary  Frequency, nocturia, numbness, tingling, seizures,  Focal weakness, Loss of consciousness,  Tremor, insomnia, depression, anxiety, and suicidal ideation.      Objective:  BP (!) 162/84   Pulse 66   Temp 98.1 F (36.7 C) (Oral)   Ht '5\' 4"'$  (1.626 m)   Wt 150 lb (68 kg)   SpO2 95%   BMI 25.75 kg/m   BP Readings from Last 3 Encounters:  09/04/22 (!) 162/84  04/14/22 (!) 149/60  03/20/22 (!) 150/66    Wt Readings from Last 3 Encounters:   09/04/22 150 lb (68 kg)  04/14/22 139 lb (63 kg)  03/20/22 141 lb 9.6 oz (64.2 kg)    Physical Exam  Lab Results  Component Value Date   HGBA1C 6.1 (H) 03/20/2022   HGBA1C 6.9 (A) 11/17/2021   HGBA1C 9.1 (H) 08/18/2021    Lab Results  Component Value Date   CREATININE 0.95 03/20/2022   CREATININE 0.84 11/17/2021   CREATININE 1.00 08/18/2021    Lab Results  Component Value Date   WBC 6.4 04/21/2018   HGB 14.5 04/21/2018   HCT 42.7 04/21/2018   PLT 324.0 04/21/2018   GLUCOSE 116 (H) 03/20/2022   CHOL 140 03/20/2022   TRIG 140 03/20/2022   HDL 53 03/20/2022   LDLDIRECT 74 03/20/2022   LDLCALC 65 03/20/2022   ALT 9 03/20/2022   AST 14 03/20/2022   NA 142 03/20/2022   K 4.6 03/20/2022   CL 103 03/20/2022   CREATININE 0.95 03/20/2022   BUN 17 03/20/2022   CO2 26 03/20/2022   TSH 1.15 05/08/2022   INR 0.88 02/17/2018   HGBA1C 6.1 (H) 03/20/2022   MICROALBUR 4.0 03/20/2022    No results found.  Assessment & Plan:  .HTN (hypertension), benign  Type 2 diabetes mellitus with diabetic neuropathy, with long-term current use of insulin (HCC)  Hyperlipidemia associated with type 2 diabetes mellitus ([place])  B12 deficiency     I provided 30 minutes of face-to-face time during this encounter reviewing patient's last visit with me, patient's  most recent visit with cardiology,  nephrology,  and neurology,  recent surgical and non surgical procedures, previous  labs and imaging studies, counseling on currently addressed issues,  and post visit ordering to diagnostics and therapeutics .   Follow-up: No follow-ups on file.   Crecencio Mc, MD

## 2022-09-04 NOTE — Assessment & Plan Note (Addendum)
Using 16 units of levemir.  .I have downloaded and reviewed the data from patient's continuous blood glucose monitor. Patient's  sugars have been  IN RANGE  79   % OF THE TIME,   BELOW RANGE  6  % of the time.  And ABOVE RANGE 15  % OF THE TIME .  Medication changes were made based on this review as follows: continue levemir 16 units and 1000 mg metformin.  Advsied to drnk 4 ounces mild befre bedtime to reduce nocturnal  lows

## 2022-09-06 DIAGNOSIS — G4762 Sleep related leg cramps: Secondary | ICD-10-CM | POA: Insufficient documentation

## 2022-09-06 NOTE — Assessment & Plan Note (Signed)
Managed now with restoril to .  The risks and benefits of benzodiazepine use  were reviewed   with patient today including excessive sedation leading to respiratory depression,  impaired thinking/driving, and addiction.  Patient was advised to avoid concurrent use with alcohol, to use medication only as needed and not to share with others  .

## 2022-09-06 NOTE — Assessment & Plan Note (Signed)
Recurrent intermittent elevations.  U/S of liver in 2019 was normal.  Occurs when thyroid is hyperactive .  [place] LFTs are normal   Lab Results  Component Value Date   ALT 11 09/04/2022   AST 14 09/04/2022   ALKPHOS 90 09/04/2022   BILITOT 0.5 09/04/2022

## 2022-09-06 NOTE — Assessment & Plan Note (Signed)
Electrolytes are normal.  Advised to try drinking an ounce of pickle juice as a preventive.   Lab Results  Component Value Date   NA 142 09/04/2022   K 4.6 09/04/2022   CL 103 09/04/2022   CO2 30 09/04/2022   Lab Results  Component Value Date   TSH 1.15 05/08/2022

## 2022-09-07 ENCOUNTER — Telehealth: Payer: Self-pay

## 2022-09-07 NOTE — Telephone Encounter (Signed)
Myhart message sent to pt regarding reuslts

## 2022-09-10 ENCOUNTER — Other Ambulatory Visit: Payer: Self-pay | Admitting: Internal Medicine

## 2022-09-14 ENCOUNTER — Ambulatory Visit (INDEPENDENT_AMBULATORY_CARE_PROVIDER_SITE_OTHER): Payer: Medicare HMO

## 2022-09-14 VITALS — Ht 64.0 in | Wt 150.0 lb

## 2022-09-14 DIAGNOSIS — Z1231 Encounter for screening mammogram for malignant neoplasm of breast: Secondary | ICD-10-CM

## 2022-09-14 DIAGNOSIS — Z0000 Encounter for general adult medical examination without abnormal findings: Secondary | ICD-10-CM

## 2022-09-14 NOTE — Patient Instructions (Addendum)
Maria Hinton , Thank you for taking time to come for your Medicare Wellness Visit. I appreciate your ongoing commitment to your health goals. Please review the following plan we discussed and let me know if I can assist you in the future.   These are the goals we discussed:  Goals Addressed               This Visit's Progress     Patient Stated     Weight (lb) < 140 lb (63.5 kg) (pt-stated)   150 lb (68 kg)     Healthy diet. Stay hydrated. Stay active.         This is a list of the screening recommended for you and due dates:  Health Maintenance  Topic Date Due   COVID-19 Vaccine (5 - 2023-24 season) 09/19/2022*   Zoster (Shingles) Vaccine (1 of 2) 12/02/2022*   Complete foot exam   11/18/2022   Mammogram  12/05/2022   Eye exam for diabetics  02/12/2023   Hemoglobin A1C  03/07/2023   Yearly kidney health urinalysis for diabetes  03/21/2023   Colon Cancer Screening  05/11/2023   Yearly kidney function blood test for diabetes  09/04/2023   Medicare Annual Wellness Visit  09/14/2023   DTaP/Tdap/Td vaccine (2 - Td or Tdap) 02/10/2024   Pneumonia Vaccine  Completed   Flu Shot  Completed   DEXA scan (bone density measurement)  Completed   Hepatitis C Screening: USPSTF Recommendation to screen - Ages 34-79 yo.  Completed   HPV Vaccine  Aged Out  *Topic was postponed. The date shown is not the original due date.    Advanced directives: End of life planning; Advance aging; Advanced directives discussed.  Copy of current HCPOA/Living Will requested.    Conditions/risks identified: none new  Next appointment: Follow up in one year for your annual wellness visit    Preventive Care 65 Years and Older, Female Preventive care refers to lifestyle choices and visits with your health care provider that can promote health and wellness. What does preventive care include? A yearly physical exam. This is also called an annual well check. Dental exams once or twice a year. Routine  eye exams. Ask your health care provider how often you should have your eyes checked. Personal lifestyle choices, including: Daily care of your teeth and gums. Regular physical activity. Eating a healthy diet. Avoiding tobacco and drug use. Limiting alcohol use. Practicing safe sex. Taking low-dose aspirin every day. Taking vitamin and mineral supplements as recommended by your health care provider. What happens during an annual well check? The services and screenings done by your health care provider during your annual well check will depend on your age, overall health, lifestyle risk factors, and family history of disease. Counseling  Your health care provider may ask you questions about your: Alcohol use. Tobacco use. Drug use. Emotional well-being. Home and relationship well-being. Sexual activity. Eating habits. History of falls. Memory and ability to understand (cognition). Work and work Statistician. Reproductive health. Screening  You may have the following tests or measurements: Height, weight, and BMI. Blood pressure. Lipid and cholesterol levels. These may be checked every 5 years, or more frequently if you are over 58 years old. Skin check. Lung cancer screening. You may have this screening every year starting at age 18 if you have a 30-pack-year history of smoking and currently smoke or have quit within the past 15 years. Fecal occult blood test (FOBT) of the stool. You may have  this test every year starting at age 63. Flexible sigmoidoscopy or colonoscopy. You may have a sigmoidoscopy every 5 years or a colonoscopy every 10 years starting at age 27. Hepatitis C blood test. Hepatitis B blood test. Sexually transmitted disease (STD) testing. Diabetes screening. This is done by checking your blood sugar (glucose) after you have not eaten for a while (fasting). You may have this done every 1-3 years. Bone density scan. This is done to screen for osteoporosis. You may  have this done starting at age 69. Mammogram. This may be done every 1-2 years. Talk to your health care provider about how often you should have regular mammograms. Talk with your health care provider about your test results, treatment options, and if necessary, the need for more tests. Vaccines  Your health care provider may recommend certain vaccines, such as: Influenza vaccine. This is recommended every year. Tetanus, diphtheria, and acellular pertussis (Tdap, Td) vaccine. You may need a Td booster every 10 years. Zoster vaccine. You may need this after age 20. Pneumococcal 13-valent conjugate (PCV13) vaccine. One dose is recommended after age 54. Pneumococcal polysaccharide (PPSV23) vaccine. One dose is recommended after age 93. Talk to your health care provider about which screenings and vaccines you need and how often you need them. This information is not intended to replace advice given to you by your health care provider. Make sure you discuss any questions you have with your health care provider. Document Released: 07/19/2015 Document Revised: 03/11/2016 Document Reviewed: 04/23/2015 Elsevier Interactive Patient Education  2017 [place] Prevention in the Home Falls can cause injuries. They can happen to people of all ages. There are many things you can do to make your home safe and to help prevent falls. What can I do on the outside of my home? Regularly fix the edges of walkways and driveways and fix any cracks. Remove anything that might make you trip as you walk through a door, such as a raised step or threshold. Trim any bushes or trees on the path to your home. Use bright outdoor lighting. Clear any walking paths of anything that might make someone trip, such as rocks or tools. Regularly check to see if handrails are loose or broken. Make sure that both sides of any steps have handrails. Any raised decks and porches should have guardrails on the edges. Have any  leaves, snow, or ice cleared regularly. Use sand or salt on walking paths during winter. Clean up any spills in your garage right away. This includes oil or grease spills. What can I do in the bathroom? Use night lights. Install grab bars by the toilet and in the tub and shower. Do not use towel bars as grab bars. Use non-skid mats or decals in the tub or shower. If you need to sit down in the shower, use a plastic, non-slip stool. Keep the floor dry. Clean up any water that spills on the floor as soon as it happens. Remove soap buildup in the tub or shower regularly. Attach bath mats securely with double-sided non-slip rug tape. Do not have throw rugs and other things on the floor that can make you trip. What can I do in the bedroom? Use night lights. Make sure that you have a light by your bed that is easy to reach. Do not use any sheets or blankets that are too big for your bed. They should not hang down onto the floor. Have a firm chair that has side arms. You  can use this for support while you get dressed. Do not have throw rugs and other things on the floor that can make you trip. What can I do in the kitchen? Clean up any spills right away. Avoid walking on wet floors. Keep items that you use a lot in easy-to-reach places. If you need to reach something above you, use a strong step stool that has a grab bar. Keep electrical cords out of the way. Do not use floor polish or wax that makes floors slippery. If you must use wax, use non-skid floor wax. Do not have throw rugs and other things on the floor that can make you trip. What can I do with my stairs? Do not leave any items on the stairs. Make sure that there are handrails on both sides of the stairs and use them. Fix handrails that are broken or loose. Make sure that handrails are as long as the stairways. Check any carpeting to make sure that it is firmly attached to the stairs. Fix any carpet that is loose or worn. Avoid  having throw rugs at the top or bottom of the stairs. If you do have throw rugs, attach them to the floor with carpet tape. Make sure that you have a light switch at the top of the stairs and the bottom of the stairs. If you do not have them, ask someone to add them for you. What else can I do to help prevent falls? Wear shoes that: Do not have high heels. Have rubber bottoms. Are comfortable and fit you well. Are closed at the toe. Do not wear sandals. If you use a stepladder: Make sure that it is fully opened. Do not climb a closed stepladder. Make sure that both sides of the stepladder are locked into place. Ask someone to hold it for you, if possible. Clearly mark and make sure that you can see: Any grab bars or handrails. First and last steps. Where the edge of each step is. Use tools that help you move around (mobility aids) if they are needed. These include: Canes. Walkers. Scooters. Crutches. Turn on the lights when you go into a dark area. Replace any light bulbs as soon as they burn out. Set up your furniture so you have a clear path. Avoid moving your furniture around. If any of your floors are uneven, fix them. If there are any pets around you, be aware of where they are. Review your medicines with your doctor. Some medicines can make you feel dizzy. This can increase your chance of falling. Ask your doctor what other things that you can do to help prevent falls. This information is not intended to replace advice given to you by your health care provider. Make sure you discuss any questions you have with your health care provider. Document Released: 04/18/2009 Document Revised: 11/28/2015 Document Reviewed: 07/27/2014 Elsevier Interactive Patient Education  2017 [place]. Opioid Pain Medicine Management Opioids are powerful medicines that are used to treat moderate to severe pain. When used for short periods of time, they can help you to: Sleep better. Do better in  physical or occupational therapy. Feel better in the first few days after an injury. Recover from surgery. Opioids should be taken with the supervision of a trained health care provider. They should be taken for the shortest period of time possible. This is because opioids can be addictive, and the longer you take opioids, the greater your risk of addiction. This addiction can also be called  opioid use disorder. What are the risks? Using opioid pain medicines for longer than 3 days increases your risk of side effects. Side effects include: Constipation. Nausea and vomiting. Breathing difficulties (respiratory depression). Drowsiness. Confusion. Opioid use disorder. Itching. Taking opioid pain medicine for a long period of time can affect your ability to do daily tasks. It also puts you at risk for: Motor vehicle crashes. Depression. Suicide. Heart attack. Overdose, which can be life-threatening. What is a pain treatment plan? A pain treatment plan is an agreement between you and your health care provider. Pain is unique to each person, and treatments vary depending on your condition. To manage your pain, you and your health care provider need to work together. To help you do this: Discuss the goals of your treatment, including how much pain you might expect to have and how you will manage the pain. Review the risks and benefits of taking opioid medicines. Remember that a good treatment plan uses more than one approach and minimizes the chance of side effects. Be honest about the amount of medicines you take and about any drug or alcohol use. Get pain medicine prescriptions from only one health care provider. Pain can be managed with many types of alternative treatments. Ask your health care provider to refer you to one or more specialists who can help you manage pain through: Physical or occupational therapy. Counseling (cognitive behavioral therapy). Good  nutrition. Biofeedback. Massage. Meditation. Non-opioid medicine. Following a gentle exercise program. How to use opioid pain medicine Taking medicine Take your pain medicine exactly as told by your health care provider. Take it only when you need it. If your pain gets less severe, you may take less than your prescribed dose if your health care provider approves. If you are not having pain, do nottake pain medicine unless your health care provider tells you to take it. If your pain is severe, do nottry to treat it yourself by taking more pills than instructed on your prescription. Contact your health care provider for help. Write down the times when you take your pain medicine. It is easy to become confused while on pain medicine. Writing the time can help you avoid overdose. Take other over-the-counter or prescription medicines only as told by your health care provider. Keeping yourself and others safe  While you are taking opioid pain medicine: Do not drive, use machinery, or power tools. Do not sign legal documents. Do not drink alcohol. Do not take sleeping pills. Do not supervise children by yourself. Do not do activities that require climbing or being in high places. Do not go to a lake, river, ocean, spa, or swimming pool. Do not share your pain medicine with anyone. Keep pain medicine in a locked cabinet or in a secure area where pets and children cannot reach it. Stopping your use of opioids If you have been taking opioid medicine for more than a few weeks, you may need to slowly decrease (taper) how much you take until you stop completely. Tapering your use of opioids can decrease your risk of symptoms of withdrawal, such as: Pain and cramping in the abdomen. Nausea. Sweating. Sleepiness. Restlessness. Uncontrollable shaking (tremors). Cravings for the medicine. Do not attempt to taper your use of opioids on your own. Talk with your health care provider about how to do  this. Your health care provider may prescribe a step-down schedule based on how much medicine you are taking and how long you have been taking it. Getting rid of leftover  pills Do not save any leftover pills. Get rid of leftover pills safely by: Taking the medicine to a prescription take-back program. This is usually offered by the county or law enforcement. Bringing them to a pharmacy that has a drug disposal container. Flushing them down the toilet. Check the label or package insert of your medicine to see whether this is safe to do. Throwing them out in the trash. Check the label or package insert of your medicine to see whether this is safe to do. If it is safe to throw it out, remove the medicine from the original container, put it into a sealable bag or container, and mix it with used coffee grounds, food scraps, dirt, or cat litter before putting it in the trash. Follow these instructions at home: Activity Do exercises as told by your health care provider. Avoid activities that make your pain worse. Return to your normal activities as told by your health care provider. Ask your health care provider what activities are safe for you. General instructions You may need to take these actions to prevent or treat constipation: Drink enough fluid to keep your urine pale yellow. Take over-the-counter or prescription medicines. Eat foods that are high in fiber, such as beans, whole grains, and fresh fruits and vegetables. Limit foods that are high in fat and processed sugars, such as fried or sweet foods. Keep all follow-up visits. This is important. Where to find support If you have been taking opioids for a long time, you may benefit from receiving support for quitting from a local support group or counselor. Ask your health care provider for a referral to these resources in your area. Where to find more information Centers for Disease Control and Prevention (CDC): http://www.wolf.info/ [place] Food and  Drug Administration (FDA): [place] Get help right away if: You may have taken too much of an opioid (overdosed). Common symptoms of an overdose: Your breathing is slower or more shallow than normal. You have a very slow heartbeat (pulse). You have slurred speech. You have nausea and vomiting. Your pupils become very small. You have other potential symptoms: You are very confused. You faint or feel like you will faint. You have cold, clammy skin. You have blue lips or fingernails. You have thoughts of harming yourself or harming others. These symptoms may represent a serious problem that is an emergency. Do not wait to see if the symptoms will go away. Get medical help right away. Call your local emergency services (911 in the [place] Do not drive yourself to the hospital.  If you ever feel like you may hurt yourself or others, or have thoughts about taking your own life, get help right away. Go to your nearest emergency department or: Call your local emergency services (911 in the [place] Call the [facility] 438-865-4229 in the [place] Call a suicide crisis helpline, such as the [place] at (985)339-8664 or 988 in the [place]. This is open 24 hours a day in the [place] Text the Crisis Text Line at 970 045 1069 (in the [place].). Summary Opioid medicines can help you manage moderate to severe pain for a short period of time. A pain treatment plan is an agreement between you and your health care provider. Discuss the goals of your treatment, including how much pain you might expect to have and how you will manage the pain. If you think that you or someone else may have taken too much of an opioid, get medical help right away.  This information is not intended to replace advice given to you by your health care provider. Make sure you discuss any questions you have with your health care provider. Document Revised: 01/15/2021 Document Reviewed:  10/02/2020 Elsevier Patient Education  [place].

## 2022-09-14 NOTE — Progress Notes (Addendum)
Subjective:   Maria Hinton is a 77 y.o. female who presents for Medicare Annual (Subsequent) preventive examination.  Review of Systems    No ROS.  Medicare Wellness Virtual Visit.  Visual/audio telehealth visit, UTA vital signs.   See social history for additional risk factors.   Cardiac Risk Factors include: advanced age (>33mn, >>46women)     Objective:    Today's Vitals   09/14/22 1311  Weight: 150 lb (68 kg)  Height: '5\' 4"'$  (1.626 m)   Body mass index is 25.75 kg/m.     09/14/2022    1:11 PM 04/14/2022    1:16 PM 10/27/2021    2:36 PM 09/05/2021   10:01 AM 09/04/2020    9:43 AM 05/10/2020    7:29 AM 03/07/2020   10:31 AM  Advanced Directives  Does Patient Have a Medical Advance Directive? Yes No Yes Yes Yes Yes Yes  Type of AParamedicof ABarrelvilleLiving will  HDeep River CenterLiving will HBeardenLiving will HEast ButlerLiving will Living will HMonte SerenoLiving will  Does patient want to make changes to medical advance directive? No - Patient declined  No - Patient declined No - Patient declined No - Patient declined    Copy of HPalomasin Chart? No - copy requested  No - copy requested No - copy requested No - copy requested      Current Medications (verified) Outpatient Encounter Medications as of 09/14/2022  Medication Sig   albuterol (VENTOLIN HFA) 108 (90 Base) MCG/ACT inhaler Inhale 2 puffs into the lungs every 6 (six) hours as needed for wheezing.   aspirin EC 81 MG tablet Take 81 mg by mouth daily.   atenolol (TENORMIN) 50 MG tablet Take 1 tablet (50 mg total) by mouth 2 (two) times daily.   atorvastatin (LIPITOR) 40 MG tablet Take 1 tablet (40 mg total) by mouth daily.   BD PEN NEEDLE NANO 2ND GEN 32G X 4 MM MISC USE DAILY AS DIRECTED WITH LEVEMIR   gabapentin (NEURONTIN) 300 MG capsule Take 1 capsule (300 mg total) by mouth 4 (four) times daily.    glucose blood (ONETOUCH VERIO) test strip Use as instructed to check blood sugars 3 times daily   insulin detemir (LEVEMIR FLEXPEN) 100 UNIT/ML FlexPen Inject 16 Units into the skin daily.   levothyroxine (SYNTHROID) 100 MCG tablet Take 1 tablet (100 mcg total) by mouth daily.   losartan (COZAAR) 50 MG tablet TAKE 1 TABLET BY MOUTH EVERY DAY   meloxicam (MOBIC) 7.5 MG tablet TAKE 1 TABLET BY MOUTH EVERY DAY (Patient not taking: Reported on 09/04/2022)   metFORMIN (GLUCOPHAGE) 1000 MG tablet TAKE 1 TABLET (1,000 MG TOTAL) BY MOUTH TWICE A DAY WITH FOOD   omeprazole (PRILOSEC) 20 MG capsule Take 1 capsule by mouth daily.   OneTouch Delica Lancets [id number] 1 application by Does not apply route 2 (two) times daily. Use to check blood sugar up to two times daily. E11.69   temazepam (RESTORIL) 15 MG capsule TAKE 1 CAPSULE BY MOUTH AT BEDTIME AS NEEDED FOR SLEEP   traMADol (ULTRAM) 50 MG tablet Take 1 tablet (50 mg total) by mouth every 6 (six) hours as needed. TAKE 1 TABLET BY MOUTH EVERY 6 HOURS AS NEEDED FOR musculoskeletal  PAIN   vitamin B-12 (CYANOCOBALAMIN) 500 MCG tablet Take 500 mcg by mouth daily.   No facility-administered encounter medications on file as of 09/14/2022.  Allergies (verified) Vicodin [hydrocodone-acetaminophen], Acetaminophen, and Vicodin hp [hydrocodone-acetaminophen]   History: Past Medical History:  Diagnosis Date   Actinic keratoses    follows with dermatology    Arthritis    Asthma    Chronic back pain    DDD,herniated disc/spondylosis/radiculopathy   COPD (chronic obstructive pulmonary disease) (HCC)    Diabetes mellitus    1980s   Early cataracts, bilateral    Dr. [name] [place]   GERD (gastroesophageal reflux disease)    takes Omeprazole daily   Hemorrhoids    History of colonic polyps    colonoscopy 04/2012 - Dr Candace Cruise   Hyperlipidemia    takes Pravastatin daily   Hypertension    takes Metoprolol/HCTZ daily   Hyperthyroidism     Insomnia    Joint pain    fingers   Joint swelling    Pneumonia    x 2 ;in Dec 2010/2011   PONV (postoperative nausea and vomiting)    Right leg weakness    Past Surgical History:  Procedure Laterality Date   ABDOMINAL HYSTERECTOMY     at age 75   BACK SURGERY  20+yrs ago   CATARACT EXTRACTION W/PHACO Left 01/04/2019   Procedure: CATARACT EXTRACTION PHACO AND INTRAOCULAR LENS PLACEMENT ([place])  LEFT;  Surgeon: Leandrew Koyanagi, MD;  Location: [facility];  Service: Ophthalmology;  Laterality: Left;  GIVE IV ZOFRAN   CATARACT EXTRACTION W/PHACO Right 02/01/2019   Procedure: CATARACT EXTRACTION PHACO AND INTRAOCULAR LENS PLACEMENT ([place])  RIGHT DIABETIC;  Surgeon: Leandrew Koyanagi, MD;  Location: [facility];  Service: Ophthalmology;  Laterality: Right;  DIABETIC GIVE IV ZOFRAN   COLONOSCOPY     COLONOSCOPY N/A 05/13/2015   Procedure: COLONOSCOPY;  Surgeon: Hulen Luster, MD;  Location[facility] ENDOSCOPY;  Service: Gastroenterology;  Laterality: N/A;   COLONOSCOPY WITH PROPOFOL N/A 05/10/2020   Procedure: COLONOSCOPY WITH PROPOFOL;  Surgeon: Virgel Manifold, MD;  Location: [facility] ENDOSCOPY;  Service: Endoscopy;  Laterality: N/A;   ESOPHAGOGASTRODUODENOSCOPY  2011   ESOPHAGOGASTRODUODENOSCOPY (EGD) WITH PROPOFOL N/A 09/02/2017   Procedure: ESOPHAGOGASTRODUODENOSCOPY (EGD) WITH PROPOFOL;  Surgeon: Virgel Manifold, MD;  Location: [facility] ENDOSCOPY;  Service: Endoscopy;  Laterality: N/A;   FOOT SURGERY  2011   bunionectomy and knot removed from bottom of both feet   JOINT REPLACEMENT Left 2014   hip  Kraskinski   LUMBAR LAMINECTOMY/DECOMPRESSION MICRODISCECTOMY  09/10/2011   Procedure: LUMBAR LAMINECTOMY/DECOMPRESSION MICRODISCECTOMY 1 LEVEL;  Surgeon: Hosie Spangle, MD;  Location: [place] NEURO ORS;  Service: Neurosurgery;  Laterality: Right;  RIGHT Lumbar  Laminotomy and microdiskectomy Lumbar Four-Five   LUMBAR LAMINECTOMY/DECOMPRESSION MICRODISCECTOMY N/A 06/26/2015    Procedure: LUMBAR LAMINECTOMY/DECOMPRESSION MICRODISCECTOMY 1 LEVEL;  Surgeon: Jovita Gamma, MD;  Location: [place] NEURO ORS;  Service: Neurosurgery;  Laterality: N/A;  L3 and L4 Laminectomies   MELANOMA EXCISION  2011   on nose/back on neck   THORACIC DISC SURGERY  at age 89 and 32    mass of veins that had gotten into muscle and wrapped around rib;3 ribs also removed from left side   TONSILLECTOMY  as a child   and adenoids    TOTAL HIP ARTHROPLASTY Left 2014   Dr. Mack Guise   TUBAL LIGATION     VAGINA SURGERY     vaginal wall ruptured    Family History  Problem Relation Age of Onset   COPD Mother        Emphysema   Diabetes Mother    Other Mother  Died in house fire at age 19.   Heart disease Father    Seizures Father    Cancer Father        Lung cancer with brain metastasis   Cancer Brother    Heart disease Brother        valve replacement   COPD Brother    Asthma Brother    COPD Son 47   Diabetes Maternal Grandmother    Cancer Other        aunt ? maternal vs paternal side lung cancer    Anesthesia problems Neg Hx    Hypotension Neg Hx    Malignant hyperthermia Neg Hx    Pseudochol deficiency Neg Hx    Thyroid disease Neg Hx    Breast cancer Neg Hx    Social History   Socioeconomic History   Marital status: Widowed    Spouse name: Not on file   Number of children: 2   Years of education: 25   Highest education level: High school graduate  Occupational History   Occupation: Retired  Tobacco Use   Smoking status: Former    Packs/day: 0.25    Years: 20.00    Total pack years: 5.00    Types: Cigarettes    Quit date: 2018    Years since quitting: 6.1   Smokeless tobacco: Never   Tobacco comments:    Pt quit 11/08/2014.Pt had quit smoking for 13 years before starting back in 2010; per 05/2017 visit quit smoking 2017 smoked total 25 years 1 ppd   Vaping Use   Vaping Use: Never used  Substance and Sexual Activity   Alcohol use: No    Alcohol/week: 0.0  standard drinks of alcohol   Drug use: No   Sexual activity: Never    Birth control/protection: Surgical  Other Topics Concern   Not on file  Social History Narrative   Widowed.    Lives alone.   Right-handed.    2 cups caffeine per day.   Social Determinants of Health   Financial Resource Strain: Low Risk  (09/10/2022)   Overall Financial Resource Strain (CARDIA)    Difficulty of Paying Living Expenses: Not hard at all  Food Insecurity: No Food Insecurity (09/10/2022)   Hunger Vital Sign    Worried About Running Out of Food in the Last Year: Never true    Ran Out of Food in the Last Year: Never true  Transportation Needs: No Transportation Needs (09/10/2022)   PRAPARE - Hydrologist (Medical): No    Lack of Transportation (Non-Medical): No  Physical Activity: Inactive (09/10/2022)   Exercise Vital Sign    Days of Exercise per Week: 0 days    Minutes of Exercise per Session: 0 min  Stress: No Stress Concern Present (09/10/2022)   [place]    Feeling of Stress : Only a little  Social Connections: Unknown (09/10/2022)   Social Connection and Isolation Panel [NHANES]    Frequency of Communication with Friends and Family: Twice a week    Frequency of Social Gatherings with Friends and Family: Once a week    Attends Religious Services: Not on Advertising copywriter or Organizations: No    Attends Archivist Meetings: Never    Marital Status: Widowed    Tobacco Counseling Counseling given: Not Answered Tobacco comments: Pt quit 11/08/2014.Pt had quit smoking for 13 years before starting back in  2010; per 05/2017 visit quit smoking 2017 smoked total 25 years 1 ppd    Clinical Intake:  Pre-visit preparation completed: Yes        Diabetes: Yes (Followed by pcp)  How often do you need to have someone help you when you read instructions, pamphlets, or other written  materials from your doctor or pharmacy?: 1 - Never  Nutrition Risk Assessment: Has the patient had any N/V/D within the last 2 months?  No  Does the patient have any non-healing wounds?  No  Has the patient had any unintentional weight loss or weight gain?  No   Diabetes: Is the patient diabetic?  Yes  If diabetic, was a CBG obtained today?  Yes , FBS 171 Did the patient bring in their glucometer from home?  No  How often do you monitor your CBG's? Libre 2 in use, checks periodically throughout the day.   Financial Strains and Diabetes Management: Are you having any financial strains with the device, your supplies or your medication? No .  Does the patient want to be seen by Chronic Care Management for management of their diabetes?  No  Would the patient like to be referred to a Nutritionist or for Diabetic Management?  No   Interpreter Needed?: No      Activities of Daily Living    09/10/2022   11:27 AM  In your present state of health, do you have any difficulty performing the following activities:  Hearing? 0  Vision? 0  Difficulty concentrating or making decisions? 0  Walking or climbing stairs? 1  Dressing or bathing? 0  Doing errands, shopping? 0  Preparing Food and eating ? N  Using the Toilet? N  In the past six months, have you accidently leaked urine? Y  Comment Managed with daily brief. Followed by pcp.  Do you have problems with loss of bowel control? Y  Comment Managed with daily brief. Followed by pcp.  Managing your Medications? N  Managing your Finances? N  Housekeeping or managing your Housekeeping? N    Patient Care Team: Crecencio Mc, MD as PCP - General (Internal Medicine) Jovita Gamma, MD as Consulting Physician (Neurosurgery)  Indicate any recent Medical Services you may have received from other than [facility] providers in the past year (date may be approximate).     Assessment:   This is a routine wellness examination for [place].  Patient  Medicare AWV questionnaire was completed by the patient on 09/10/22 , I have confirmed that all information answered by patient is correct and no changes since this date.   I connected with  AMAYAH CRIVELLI on 09/14/22 by a audio enabled telemedicine application and verified that I am speaking with the correct person using two identifiers.  Patient Location: Home  Provider Location: Office/Clinic  I discussed the limitations of evaluation and management by telemedicine. The patient expressed understanding and agreed to proceed.   Hearing/Vision screen Hearing Screening - Comments:: Patient is able to hear conversational tones without difficulty. No issues reported. Vision Screening - Comments:: They have seen their ophthalmologist.  Wears reading glasses. Cataract extracted, bilateral    Dietary issues and exercise activities discussed: Current Exercise Habits: Home exercise routine, Type of exercise: walking, Intensity: Mild Healthy diet   Goals Addressed               This Visit's Progress     Patient Stated     Weight (lb) < 140 lb (63.5 kg) (pt-stated)  150 lb (68 kg)     Healthy diet. Stay hydrated. Stay active.       Depression Screen    09/14/2022    1:20 PM 09/04/2022   11:14 AM 09/04/2022   11:13 AM 03/20/2022    1:20 PM 02/18/2022   10:14 AM 11/17/2021    1:46 PM 10/27/2021    2:45 PM  PHQ 2/9 Scores  PHQ - 2 Score 0 0 0 0 0 0 0  PHQ- 9 Score 0 3         Fall Risk    09/10/2022   11:27 AM 09/04/2022   11:13 AM 03/20/2022    1:19 PM 02/18/2022   10:14 AM 11/17/2021    1:45 PM  Fall Risk   Falls in the past year? '1 1 1 1 1  '$ Number falls in past yr: '1 1 1 1 1  '$ Injury with Fall? 0 0 0 0 0  Risk for fall due to :  History of fall(s) History of fall(s) History of fall(s) History of fall(s)  Follow up Falls evaluation completed;Falls prevention discussed Falls evaluation completed Falls evaluation completed Falls evaluation completed Falls evaluation completed     FALL RISK PREVENTION PERTAINING TO THE HOME: Home free of loose throw rugs in walkways, pet beds, electrical cords, etc? Yes  Adequate lighting in your home to reduce risk of falls? Yes   ASSISTIVE DEVICES UTILIZED TO PREVENT FALLS: Life alert? No  Use of a cane, walker or w/c? No  Grab bars in the bathroom? No  Shower chair or bench in shower? Yes  Elevated toilet seat or a handicapped toilet? No   TIMED UP AND GO: Was the test performed? No .   Cognitive Function:    01/30/2016    9:56 AM  MMSE - Mini Mental State Exam  Orientation to time 5  Orientation to Place 5  Registration 3  Attention/ Calculation 5  Recall 3  Language- name 2 objects 2  Language- repeat 1  Language- follow 3 step command 3  Language- read & follow direction 1  Write a sentence 1  Copy design 1  Total score 30        09/14/2022    1:19 PM 09/04/2019   10:09 AM 09/02/2018   12:24 PM  6CIT Screen  What Year? 0 points 0 points 0 points  What month? 0 points 0 points 0 points  What time? 0 points 0 points 0 points  Count back from 20 0 points 0 points 0 points  Months in reverse 0 points 0 points 0 points  Repeat phrase 0 points 0 points 0 points  Total Score 0 points 0 points 0 points    Immunizations Immunization History  Administered Date(s) Administered   Fluad Quad(high Dose 65+) 04/22/2020, 03/20/2022   Influenza Split 09/11/2011   Influenza, High Dose Seasonal PF 06/03/2016, 04/05/2017, 04/11/2018   Influenza,inj,Quad PF,6+ Mos 04/27/2013, 09/17/2014, 04/12/2015   Influenza-Unspecified 04/26/2019, 04/19/2021   Moderna SARS-COV2 Booster Vaccination 10/14/2020   PFIZER(Purple Top)SARS-COV-2 Vaccination 09/01/2019, 09/26/2019, 06/04/2020   Pneumococcal Conjugate-13 02/09/2014   Pneumococcal Polysaccharide-23 09/11/2011, 02/25/2018   Tdap 02/09/2014   Screening Tests Health Maintenance  Topic Date Due   COVID-19 Vaccine (5 - 2023-24 season) 09/19/2022 (Originally 03/06/2022)    Zoster Vaccines- Shingrix (1 of 2) 12/02/2022 (Originally 02/11/1998)   FOOT EXAM  11/18/2022   MAMMOGRAM  12/05/2022   OPHTHALMOLOGY EXAM  02/12/2023   HEMOGLOBIN A1C  03/07/2023   Diabetic kidney evaluation -  Urine ACR  03/21/2023   COLONOSCOPY (Pts 45-10yr Insurance coverage will need to be confirmed)  05/11/2023   Diabetic kidney evaluation - eGFR measurement  09/04/2023   Medicare Annual Wellness (AWV)  09/14/2023   DTaP/Tdap/Td (2 - Td or Tdap) 02/10/2024   Pneumonia Vaccine 75 Years old  Completed   INFLUENZA VACCINE  Completed   DEXA SCAN  Completed   Hepatitis C Screening  Completed   HPV VACCINES  Aged Out   Health Maintenance There are no preventive care reminders to display for this patient.  Mammogram- ordered, number [phone]  Lung Cancer Screening: (Low Dose CT Chest recommended if Age 827-80years, 30 pack-year currently smoking OR have quit w/in 15years.) does not qualify.   Vision Screening: Recommended annual ophthalmology exams for early detection of glaucoma and other disorders of the eye.  Dental Screening: Recommended annual dental exams for proper oral hygiene  Community Resource Referral / Chronic Care Management: CRR required this visit?  No   CCM required this visit?  No      Plan:     I have personally reviewed and noted the following in the patient's chart:   Medical and social history Use of alcohol, tobacco or illicit drugs  Current medications and supplements including opioid prescriptions. Patient is currently taking opioid prescriptions. Information provided to patient regarding non-opioid alternatives. Patient advised to discuss non-opioid treatment plan with their provider. Functional ability and status Nutritional status Physical activity Advanced directives List of other physicians Hospitalizations, surgeries, and ER visits in previous 12 months Vitals Screenings to include cognitive, depression, and falls Referrals and  appointments  In addition, I have reviewed and discussed with patient certain preventive protocols, quality metrics, and best practice recommendations. A written personalized care plan for preventive services as well as general preventive health recommendations were provided to patient.     DMission Hill LPN   [phone]      I have reviewed the above information and agree with above.   TDeborra Medina MD

## 2022-09-25 ENCOUNTER — Ambulatory Visit: Payer: Medicare HMO | Admitting: Nurse Practitioner

## 2022-09-25 VITALS — BP 130/70 | HR 72 | Temp 98.0°F | Ht 64.0 in | Wt 146.0 lb

## 2022-09-25 DIAGNOSIS — J40 Bronchitis, not specified as acute or chronic: Secondary | ICD-10-CM

## 2022-09-25 DIAGNOSIS — R0602 Shortness of breath: Secondary | ICD-10-CM

## 2022-09-25 DIAGNOSIS — K2100 Gastro-esophageal reflux disease with esophagitis, without bleeding: Secondary | ICD-10-CM

## 2022-09-25 DIAGNOSIS — R5383 Other fatigue: Secondary | ICD-10-CM

## 2022-09-25 LAB — POC COVID19 BINAXNOW: SARS Coronavirus 2 Ag: NEGATIVE

## 2022-09-25 MED ORDER — PREDNISONE 10 MG PO TABS: ORAL_TABLET | ORAL | 0 refills | Status: DC

## 2022-09-25 MED ORDER — IPRATROPIUM BROMIDE 0.02 % IN SOLN: 0.5000 mg | Freq: Once | RESPIRATORY_TRACT | Status: AC

## 2022-09-25 MED ORDER — OMEPRAZOLE 20 MG PO CPDR: 20.0000 mg | DELAYED_RELEASE_CAPSULE | Freq: Every day | ORAL | 1 refills | Status: DC

## 2022-09-25 MED ORDER — AMOXICILLIN-POT CLAVULANATE 875-125 MG PO TABS: 1.0000 | ORAL_TABLET | Freq: Two times a day (BID) | ORAL | 0 refills | Status: DC

## 2022-09-25 MED ORDER — IPRATROPIUM-ALBUTEROL 0.5-2.5 (3) MG/3ML IN SOLN
3.0000 mL | Freq: Once | RESPIRATORY_TRACT | Status: DC
  Administered 2022-09-25: 3 mL via RESPIRATORY_TRACT

## 2022-09-25 NOTE — Patient Instructions (Addendum)
Sent prescription to pharmacy. Use albuterol every 4-6 hours as needed Increase fluid intake. If symptoms not improving call the office for further evaluation.

## 2022-09-25 NOTE — Progress Notes (Signed)
Established Patient Office Visit  Subjective:  Patient ID: Maria Hinton, female    DOB: [birth year]  Age: 77 y.o. MRN: [id number]  CC:  Chief Complaint  Patient presents with   Acute Visit    Cough/congestion/wheezing/weak/fatigue 7 days.    HPI  Maria Hinton presents for cough.    Cough This is a new problem. The current episode started in the past 7 days. The problem has been gradually improving. The cough is Productive of sputum. Associated symptoms include nasal congestion, shortness of breath and wheezing. Pertinent negatives include no ear congestion, ear pain or headaches. Associated symptoms comments: Chest tightnes. Risk factors for lung disease include smoking/tobacco exposure and animal exposure. Her past medical history is significant for asthma.     Past Medical History:  Diagnosis Date   Actinic keratoses    follows with dermatology    Arthritis    Asthma    Chronic back pain    DDD,herniated disc/spondylosis/radiculopathy   COPD (chronic obstructive pulmonary disease) (HCC)    Diabetes mellitus    1980s   Early cataracts, bilateral    Dr. [name] [place]   GERD (gastroesophageal reflux disease)    takes Omeprazole daily   Hemorrhoids    History of colonic polyps    colonoscopy 04/2012 - Dr Candace Cruise   Hyperlipidemia    takes Pravastatin daily   Hypertension    takes Metoprolol/HCTZ daily   Hyperthyroidism    Insomnia    Joint pain    fingers   Joint swelling    Pneumonia    x 2 ;in Dec 2010/2011   PONV (postoperative nausea and vomiting)    Right leg weakness     Past Surgical History:  Procedure Laterality Date   ABDOMINAL HYSTERECTOMY     at age 43   BACK SURGERY  20+yrs ago   CATARACT EXTRACTION W/PHACO Left 01/04/2019   Procedure: CATARACT EXTRACTION PHACO AND INTRAOCULAR LENS PLACEMENT ([place])  LEFT;  Surgeon: Leandrew Koyanagi, MD;  Location: [place];  Service: Ophthalmology;  Laterality: Left;  GIVE IV ZOFRAN    CATARACT EXTRACTION W/PHACO Right 02/01/2019   Procedure: CATARACT EXTRACTION PHACO AND INTRAOCULAR LENS PLACEMENT ([place])  RIGHT DIABETIC;  Surgeon: Leandrew Koyanagi, MD;  Location: [place];  Service: Ophthalmology;  Laterality: Right;  DIABETIC GIVE IV ZOFRAN   COLONOSCOPY     COLONOSCOPY N/A 05/13/2015   Procedure: COLONOSCOPY;  Surgeon: Hulen Luster, MD;  Location: [facility] ENDOSCOPY;  Service: Gastroenterology;  Laterality: N/A;   COLONOSCOPY WITH PROPOFOL N/A 05/10/2020   Procedure: COLONOSCOPY WITH PROPOFOL;  Surgeon: Virgel Manifold, MD;  Location: [facility] ENDOSCOPY;  Service: Endoscopy;  Laterality: N/A;   ESOPHAGOGASTRODUODENOSCOPY  2011   ESOPHAGOGASTRODUODENOSCOPY (EGD) WITH PROPOFOL N/A 09/02/2017   Procedure: ESOPHAGOGASTRODUODENOSCOPY (EGD) WITH PROPOFOL;  Surgeon: Virgel Manifold, MD;  Location: [facility] ENDOSCOPY;  Service: Endoscopy;  Laterality: N/A;   FOOT SURGERY  2011   bunionectomy and knot removed from bottom of both feet   JOINT REPLACEMENT Left 2014   hip  Kraskinski   LUMBAR LAMINECTOMY/DECOMPRESSION MICRODISCECTOMY  09/10/2011   Procedure: LUMBAR LAMINECTOMY/DECOMPRESSION MICRODISCECTOMY 1 LEVEL;  Surgeon: Hosie Spangle, MD;  Location: [place] NEURO ORS;  Service: Neurosurgery;  Laterality: Right;  RIGHT Lumbar  Laminotomy and microdiskectomy Lumbar Four-Five   LUMBAR LAMINECTOMY/DECOMPRESSION MICRODISCECTOMY N/A 06/26/2015   Procedure: LUMBAR LAMINECTOMY/DECOMPRESSION MICRODISCECTOMY 1 LEVEL;  Surgeon: Jovita Gamma, MD;  Location: [place] NEURO ORS;  Service: Neurosurgery;  Laterality: N/A;  L3 and L4 Laminectomies   MELANOMA EXCISION  2011   on nose/back on neck   THORACIC DISC SURGERY  at age 37 and 82    mass of veins that had gotten into muscle and wrapped around rib;3 ribs also removed from left side   TONSILLECTOMY  as a child   and adenoids    TOTAL HIP ARTHROPLASTY Left 2014   Dr. Mack Guise   TUBAL LIGATION     VAGINA SURGERY     vaginal wall  ruptured     Family History  Problem Relation Age of Onset   COPD Mother        Emphysema   Diabetes Mother    Other Mother        Died in house fire at age 96.   Heart disease Father    Seizures Father    Cancer Father        Lung cancer with brain metastasis   Cancer Brother    Heart disease Brother        valve replacement   COPD Brother    Asthma Brother    COPD Son 50   Diabetes Maternal Grandmother    Cancer Other        aunt ? maternal vs paternal side lung cancer    Anesthesia problems Neg Hx    Hypotension Neg Hx    Malignant hyperthermia Neg Hx    Pseudochol deficiency Neg Hx    Thyroid disease Neg Hx    Breast cancer Neg Hx     Social History   Socioeconomic History   Marital status: Widowed    Spouse name: Not on file   Number of children: 2   Years of education: 67   Highest education level: 12th grade  Occupational History   Occupation: Retired  Tobacco Use   Smoking status: Former    Packs/day: 0.25    Years: 20.00    Additional pack years: 0.00    Total pack years: 5.00    Types: Cigarettes    Quit date: 2018    Years since quitting: 6.2   Smokeless tobacco: Never   Tobacco comments:    Pt quit 11/08/2014.Pt had quit smoking for 13 years before starting back in 2010; per 05/2017 visit quit smoking 2017 smoked total 25 years 1 ppd   Vaping Use   Vaping Use: Never used  Substance and Sexual Activity   Alcohol use: No    Alcohol/week: 0.0 standard drinks of alcohol   Drug use: No   Sexual activity: Never    Birth control/protection: Surgical  Other Topics Concern   Not on file  Social History Narrative   Widowed.    Lives alone.   Right-handed.    2 cups caffeine per day.   Social Determinants of Health   Financial Resource Strain: Low Risk  (09/24/2022)   Overall Financial Resource Strain (CARDIA)    Difficulty of Paying Living Expenses: Not hard at all  Food Insecurity: No Food Insecurity (09/24/2022)   Hunger Vital Sign     Worried About Running Out of Food in the Last Year: Never true    Ran Out of Food in the Last Year: Never true  Transportation Needs: No Transportation Needs (09/24/2022)   PRAPARE - Hydrologist (Medical): No    Lack of Transportation (Non-Medical): No  Physical Activity: Inactive (09/24/2022)   Exercise Vital Sign    Days of Exercise per Week: 0 days  Minutes of Exercise per Session: 0 min  Stress: No Stress Concern Present (09/24/2022)   [place]    Feeling of Stress : Only a little  Social Connections: Unknown (09/24/2022)   Social Connection and Isolation Panel [NHANES]    Frequency of Communication with Friends and Family: Three times a week    Frequency of Social Gatherings with Friends and Family: Once a week    Attends Religious Services: Patient declined    Marine scientist or Organizations: No    Attends Archivist Meetings: Never    Marital Status: Widowed  Intimate Partner Violence: Not At Risk (09/14/2022)   Humiliation, Afraid, Rape, and Kick questionnaire    Fear of Current or Ex-Partner: No    Emotionally Abused: No    Physically Abused: No    Sexually Abused: No     Outpatient Medications Prior to Visit  Medication Sig Dispense Refill   albuterol (VENTOLIN HFA) 108 (90 Base) MCG/ACT inhaler Inhale 2 puffs into the lungs every 6 (six) hours as needed for wheezing. 6.7 g 11   aspirin EC 81 MG tablet Take 81 mg by mouth daily.     atenolol (TENORMIN) 50 MG tablet Take 1 tablet (50 mg total) by mouth 2 (two) times daily. 180 tablet 1   atorvastatin (LIPITOR) 40 MG tablet Take 1 tablet (40 mg total) by mouth daily. 90 tablet 3   BD PEN NEEDLE NANO 2ND GEN 32G X 4 MM MISC USE DAILY AS DIRECTED WITH LEVEMIR 100 each 1   gabapentin (NEURONTIN) 300 MG capsule Take 1 capsule (300 mg total) by mouth 4 (four) times daily. 120 capsule 1   glucose blood (ONETOUCH  VERIO) test strip Use as instructed to check blood sugars 3 times daily 100 strip 12   insulin detemir (LEVEMIR FLEXPEN) 100 UNIT/ML FlexPen Inject 16 Units into the skin daily. 15 mL 1   levothyroxine (SYNTHROID) 100 MCG tablet Take 1 tablet (100 mcg total) by mouth daily. 90 tablet 3   losartan (COZAAR) 50 MG tablet TAKE 1 TABLET BY MOUTH EVERY DAY 90 tablet 3   meloxicam (MOBIC) 7.5 MG tablet TAKE 1 TABLET BY MOUTH EVERY DAY 30 tablet 2   metFORMIN (GLUCOPHAGE) 1000 MG tablet TAKE 1 TABLET (1,000 MG TOTAL) BY MOUTH TWICE A DAY WITH FOOD 180 tablet 1   OneTouch Delica Lancets 99991111 MISC 1 application by Does not apply route 2 (two) times daily. Use to check blood sugar up to two times daily. E11.69 100 each 11   traMADol (ULTRAM) 50 MG tablet Take 1 tablet (50 mg total) by mouth every 6 (six) hours as needed. TAKE 1 TABLET BY MOUTH EVERY 6 HOURS AS NEEDED FOR musculoskeletal  PAIN 90 tablet 5   vitamin B-12 (CYANOCOBALAMIN) 500 MCG tablet Take 500 mcg by mouth daily.     omeprazole (PRILOSEC) 20 MG capsule Take 1 capsule by mouth daily.     temazepam (RESTORIL) 15 MG capsule TAKE 1 CAPSULE BY MOUTH AT BEDTIME AS NEEDED FOR SLEEP 30 capsule 0   No facility-administered medications prior to visit.    Allergies  Allergen Reactions   Vicodin [Hydrocodone-Acetaminophen] Other (See Comments)    Insomnia    Acetaminophen     Other reaction(s): Insomnia   Vicodin Hp [Hydrocodone-Acetaminophen]     ROS Review of Systems  HENT:  Negative for ear pain.   Respiratory:  Positive for cough, shortness of breath and  wheezing.   Neurological:  Negative for headaches.      Objective:    Physical Exam Constitutional:      Appearance: Normal appearance.  HENT:     Right Ear: Tympanic membrane normal.     Left Ear: Tympanic membrane normal.     Nose: Nose normal.  Cardiovascular:     Rate and Rhythm: Normal rate and regular rhythm.     Pulses: Normal pulses.     Heart sounds: Normal heart  sounds.  Pulmonary:     Effort: Pulmonary effort is normal.     Breath sounds: Wheezing present.  Neurological:     General: No focal deficit present.     Mental Status: She is alert and oriented to person, place, and time. Mental status is at baseline.  Psychiatric:        Mood and Affect: Mood normal.        Behavior: Behavior normal.        Thought Content: Thought content normal.        Judgment: Judgment normal.     BP 130/70   Pulse 72   Temp 98 F (36.7 C) (Oral)   Ht 5\' 4"  (1.626 m)   Wt 146 lb (66.2 kg)   SpO2 99%   BMI 25.06 kg/m  Wt Readings from Last 3 Encounters:  09/25/22 146 lb (66.2 kg)  09/14/22 150 lb (68 kg)  09/04/22 150 lb (68 kg)     Health Maintenance  Topic Date Due   COVID-19 Vaccine (5 - 2023-24 season) 03/06/2022   Zoster Vaccines- Shingrix (1 of 2) 12/02/2022 (Originally 02/11/1998)   FOOT EXAM  11/18/2022   MAMMOGRAM  12/05/2022   INFLUENZA VACCINE  02/04/2023   OPHTHALMOLOGY EXAM  02/12/2023   HEMOGLOBIN A1C  03/07/2023   Diabetic kidney evaluation - Urine ACR  03/21/2023   COLONOSCOPY (Pts 45-73yrs Insurance coverage will need to be confirmed)  05/11/2023   Diabetic kidney evaluation - eGFR measurement  09/04/2023   Medicare Annual Wellness (AWV)  09/14/2023   DTaP/Tdap/Td (2 - Td or Tdap) 02/10/2024   Pneumonia Vaccine 25+ Years old  Completed   DEXA SCAN  Completed   Hepatitis C Screening  Completed   HPV VACCINES  Aged Out    There are no preventive care reminders to display for this patient.  Lab Results  Component Value Date   TSH 1.15 05/08/2022   Lab Results  Component Value Date   WBC 10.6 (H) 09/04/2022   HGB 12.1 09/04/2022   HCT 36.7 09/04/2022   MCV 89.6 09/04/2022   PLT 365.0 09/04/2022   Lab Results  Component Value Date   NA 142 09/04/2022   K 4.6 09/04/2022   CO2 30 09/04/2022   GLUCOSE 112 (H) 09/04/2022   BUN 14 09/04/2022   CREATININE 0.83 09/04/2022   BILITOT 0.5 09/04/2022   ALKPHOS 90  09/04/2022   AST 14 09/04/2022   ALT 11 09/04/2022   PROT 6.9 09/04/2022   ALBUMIN 3.9 09/04/2022   CALCIUM 8.6 09/04/2022   ANIONGAP 9 02/17/2018   EGFR 63 03/20/2022   GFR 69.38 09/04/2022   Lab Results  Component Value Date   CHOL 121 09/04/2022   Lab Results  Component Value Date   HDL 60.60 09/04/2022   Lab Results  Component Value Date   LDLCALC 44 09/04/2022   Lab Results  Component Value Date   TRIG 80.0 09/04/2022   Lab Results  Component Value Date  CHOLHDL 2 09/04/2022   Lab Results  Component Value Date   HGBA1C 8.0 (H) 09/04/2022      Assessment & Plan:  Bronchitis Assessment & Plan: POCT COVID-negative Rx Augmentin and prednisone sent to the pharmacy. Nebulizer treatment provided in the office. Oxygen saturation 99 % after nebulizer treatment. Advised patient to continue albuterol inhaler every 4-6 hours for 1 week. If symptoms do not improve call the office for further evaluation.   Gastroesophageal reflux disease with esophagitis, unspecified whether hemorrhage -     Omeprazole; Take 1 capsule (20 mg total) by mouth daily.  Dispense: 30 capsule; Refill: 1  Fatigue, unspecified type -     POC COVID-19 BinaxNow  SOB (shortness of breath) -     Ipratropium Bromide  Other orders -     Amoxicillin-Pot Clavulanate; Take 1 tablet by mouth 2 (two) times daily.  Dispense: 20 tablet; Refill: 0 -     predniSONE; Take 4 tablets ( total 40 mg) by mouth for 2 days; take 3 tablets ( total 30 mg) by mouth for 2 days; take 2 tablets ( total 20 mg) by mouth for 1 day; take 1 tablet ( total 10 mg) by mouth for 1 day.  Dispense: 17 tablet; Refill: 0    Follow-up: No follow-ups on file.   Theresia Lo, NP

## 2022-09-28 ENCOUNTER — Other Ambulatory Visit: Payer: Medicare HMO | Admitting: Pharmacist

## 2022-09-28 ENCOUNTER — Other Ambulatory Visit: Payer: Self-pay | Admitting: Internal Medicine

## 2022-09-28 MED ORDER — OZEMPIC (0.25 OR 0.5 MG/DOSE) 2 MG/3ML ~~LOC~~ SOPN: 0.5000 mg | PEN_INJECTOR | SUBCUTANEOUS | 2 refills | Status: DC

## 2022-09-28 MED ORDER — TRESIBA FLEXTOUCH 100 UNIT/ML ~~LOC~~ SOPN: 14.0000 [IU] | PEN_INJECTOR | Freq: Every day | SUBCUTANEOUS | 1 refills | Status: DC

## 2022-09-28 NOTE — Progress Notes (Signed)
09/28/2022 Name: Maria Hinton MRN: [id number] DOB: [birth year]  Chief Complaint  Patient presents with   Medication Management   Diabetes    Maria Hinton is a 77 y.o. year old female who presented for a telephone visit.   They were referred to the pharmacist by their PCP for assistance in managing diabetes. Contacted patient today regarding upcoming discontinuation of Levemir.    Subjective:  Care Team: Primary Care Provider: Crecencio Mc, MD; Next Scheduled Visit: 10/12/22  Medication Access/Adherence  Current Pharmacy:  [id number] - [place] [place] - [address] [place] [address] Phone: [phone] Fax: [phone]  [id number] - [place], [place] [address] Phone: [phone] Fax: [phone]   Patient reports affordability concerns with their medications: No - though notes that she did have to stop Ozempic last year due to hitting the donut hole Patient reports access/transportation concerns to their pharmacy: No  Patient reports adherence concerns with their medications:  No     Diabetes:  Current medications: metformin 1000 mg twice daily, Levemir 16 units daily Medications tried in the past: Ozempic - notes she tolerated well but was unable to continue to afford  Date of Download: 3/12-3/25/24 % Time CGM is active: 70% Average Glucose: 182 mg/dL Glucose Management Indicator: 7.7  Glucose Variability: 33 (goal <36%) Time in Goal:  - Time in range 70-180: 59% - Time above range: 41% - Time below range: 0%  Patient denies hypoglycemic s/sx including dizziness, shakiness, sweating.   Current medication access support: discussed income. She does qualify for Ozempic patient assistance   Objective:  Lab Results  Component Value Date   HGBA1C 8.0 (H) 09/04/2022    Lab Results  Component Value Date   CREATININE 0.83 09/04/2022   BUN 14 09/04/2022   NA 142 09/04/2022   K  4.6 09/04/2022   CL 103 09/04/2022   CO2 30 09/04/2022    Lab Results  Component Value Date   CHOL 121 09/04/2022   HDL 60.60 09/04/2022   LDLCALC 44 09/04/2022   LDLDIRECT 47.0 09/04/2022   TRIG 80.0 09/04/2022   CHOLHDL 2 09/04/2022    Medications Reviewed Today     Reviewed by Osker Mason, RPH-CPP (Pharmacist) on 09/28/22 at 1131  Med List Status: <None>   Medication Order Taking? Sig Documenting Provider Last Dose Status Informant  albuterol (VENTOLIN HFA) 108 (90 Base) MCG/ACT inhaler [id number] No Inhale 2 puffs into the lungs every 6 (six) hours as needed for wheezing. Crecencio Mc, MD Taking Active   amoxicillin-clavulanate (AUGMENTIN) 875-125 MG tablet [id number]  Take 1 tablet by mouth 2 (two) times daily. Theresia Lo, NP  Active   aspirin EC 81 MG tablet [id number] No Take 81 mg by mouth daily. [provider] Taking Active Self  atenolol (TENORMIN) 50 MG tablet [id number] No Take 1 tablet (50 mg total) by mouth 2 (two) times daily. Crecencio Mc, MD Taking Active   atorvastatin (LIPITOR) 40 MG tablet [id number] No Take 1 tablet (40 mg total) by mouth daily. Crecencio Mc, MD Taking Active   BD PEN NEEDLE NANO 2ND GEN 32G X 4 MM MISC [id number] No USE DAILY AS DIRECTED WITH LEVEMIR Crecencio Mc, MD Taking Active   gabapentin (NEURONTIN) 300 MG capsule [id number] No Take 1 capsule (300 mg total) by mouth 4 (four) times daily. Crecencio Mc, MD Taking Active   glucose  blood (ONETOUCH VERIO) test strip [id number] No Use as instructed to check blood sugars 3 times daily Crecencio Mc, MD Taking Active   insulin degludec (TRESIBA FLEXTOUCH) 100 UNIT/ML FlexTouch Pen [id number] Yes Inject 14 Units into the skin daily. Crecencio Mc, MD  Active   insulin detemir (LEVEMIR FLEXPEN) 100 UNIT/ML FlexPen [id number] No Inject 16 Units into the skin daily. Crecencio Mc, MD Taking Active   ipratropium (ATROVENT) nebulizer solution 0.5 mg [id number]   Theresia Lo, NP  Active   levothyroxine (SYNTHROID) 100 MCG tablet [id number] No Take 1 tablet (100 mcg total) by mouth daily. Crecencio Mc, MD Taking Active   losartan (COZAAR) 50 MG tablet [id number] No TAKE 1 TABLET BY MOUTH EVERY DAY Crecencio Mc, MD Taking Active   meloxicam (MOBIC) 7.5 MG tablet [id number] No TAKE 1 TABLET BY MOUTH EVERY DAY Dutch Quint B, FNP Taking Active   metFORMIN (GLUCOPHAGE) 1000 MG tablet [id number] No TAKE 1 TABLET (1,000 MG TOTAL) BY MOUTH TWICE A DAY WITH FOOD Crecencio Mc, MD Taking Active   omeprazole (PRILOSEC) 20 MG capsule [id number]  Take 1 capsule (20 mg total) by mouth daily. Theresia Lo, NP  Active   OneTouch Delica Lancets 99991111 [id number] [id number] No 1 application by Does not apply route 2 (two) times daily. Use to check blood sugar up to two times daily. E11.69 Crecencio Mc, MD Taking Active   predniSONE (DELTASONE) 10 MG tablet [id number]  Take 4 tablets ( total 40 mg) by mouth for 2 days; take 3 tablets ( total 30 mg) by mouth for 2 days; take 2 tablets ( total 20 mg) by mouth for 1 day; take 1 tablet ( total 10 mg) by mouth for 1 day. Theresia Lo, NP  Active   Semaglutide,0.25 or 0.5MG /DOS, (OZEMPIC, 0.25 OR 0.5 MG/DOSE,) 2 MG/3ML SOPN [id number] Yes Inject 0.5 mg into the skin once a week. Crecencio Mc, MD  Active   temazepam (RESTORIL) 15 MG capsule [id number] No TAKE 1 CAPSULE BY MOUTH AT BEDTIME AS NEEDED FOR SLEEP Crecencio Mc, MD Taking Active   traMADol (ULTRAM) 50 MG tablet [id number] No Take 1 tablet (50 mg total) by mouth every 6 (six) hours as needed. TAKE 1 TABLET BY MOUTH EVERY 6 HOURS AS NEEDED FOR musculoskeletal  PAIN Crecencio Mc, MD Taking Active   vitamin B-12 (CYANOCOBALAMIN) 500 MCG tablet [id number] No Take 500 mcg by mouth daily. [provider] Taking Active               Assessment/Plan:   Diabetes: - Currently uncontrolled - Reviewed long term cardiovascular and renal outcomes of  uncontrolled blood sugar - Reviewed goal A1c, goal fasting, and goal 2 hour post prandial glucose - Meets financial criteria for Ozempic and [place] patient assistance program through [employer]. Will collaborate with provider, CPhT, and patient to pursue assistance. Patient has adequate supply of Levemir to last until [employer] supply can be obtained    Follow Up Plan: phone call in 6 weeks  Catie TJodi Mourning, PharmD, [place], [facility] [phone]

## 2022-09-28 NOTE — Patient Instructions (Signed)
Ms. Rehmert,   We will apply for patient assistance for Ozempic and for a different daily insulin to replace the Levemir.   For now, continue metformin and your current dose of Levemir.   We will mail you the pages of the application for Ozempic and [place] assistance that we need you to complete and mail back.   Thanks!  Catie Hedwig Morton, PharmD, [place], [facility] [phone]

## 2022-09-28 NOTE — Telephone Encounter (Signed)
LOV: 09/04/2022   NOV: 10/12/22

## 2022-10-05 NOTE — Assessment & Plan Note (Signed)
POCT COVID-negative Rx Augmentin and prednisone sent to the pharmacy. Nebulizer treatment provided in the office. Oxygen saturation 99 % after nebulizer treatment. Advised patient to continue albuterol inhaler every 4-6 hours for 1 week. If symptoms do not improve call the office for further evaluation.

## 2022-10-08 ENCOUNTER — Other Ambulatory Visit: Payer: Self-pay | Admitting: Internal Medicine

## 2022-10-09 NOTE — Telephone Encounter (Signed)
Requesting: Tramadol 50 mg Contract: No UDS: no Last Visit: 09/04/2022 Next Visit: 10/12/2022 Last Refill: 08/24/2022  Please Advise

## 2022-10-12 ENCOUNTER — Other Ambulatory Visit: Payer: Self-pay | Admitting: Pharmacist

## 2022-10-12 ENCOUNTER — Encounter: Payer: Self-pay | Admitting: Internal Medicine

## 2022-10-12 ENCOUNTER — Telehealth: Payer: Self-pay

## 2022-10-12 ENCOUNTER — Telehealth: Payer: Self-pay | Admitting: Podiatry

## 2022-10-12 ENCOUNTER — Ambulatory Visit: Payer: Medicare HMO | Admitting: Internal Medicine

## 2022-10-12 VITALS — BP 150/88 | HR 75 | Temp 98.0°F | Ht 64.0 in | Wt 151.4 lb

## 2022-10-12 DIAGNOSIS — Z794 Long term (current) use of insulin: Secondary | ICD-10-CM | POA: Diagnosis not present

## 2022-10-12 DIAGNOSIS — E1140 Type 2 diabetes mellitus with diabetic neuropathy, unspecified: Secondary | ICD-10-CM | POA: Diagnosis not present

## 2022-10-12 NOTE — Progress Notes (Signed)
Subjective:  Patient ID: Maria Hinton, female    DOB: 10/01/1945  Age: 77 y.o. MRN: [id number]  CC: The encounter diagnosis was Type 2 diabetes mellitus with diabetic neuropathy, with long-term current use of insulin.   HPI Maria Hinton presents for  Chief Complaint  Patient presents with   Medical Management of Chronic Issues    1 month follow up     1) Follow up on Type 2 DM with recent loss of control.  Taking 16 units of Levemir nightly and no ozempic  due to cost .    I have downloaded and reviewed the data from patient's continuous blood glucose monitor for the period of March 26 to October 12 2022.  Marland Kitchen. She was taking prednisone for 6 days from march 22  ending March 28  en days f Patient's  sugars have been  IN RANGE   46  % OF THE TIME,   BELOW RANGE    % of the time.  And ABOVE RANGE  41 % OF THE TIME .   2) social situation not good . Living with son and dtr In law,  but granddaughter has moved in  and now there are two marijuana smokers at dinner table, 2 dogs other than hers, a cat and a caged ferret.   patient is constantly cleaning up feces on the floor from their dogs/cat.  She is tied of feeling sick and is moving out  this month .     Outpatient Medications Prior to Visit  Medication Sig Dispense Refill   albuterol (VENTOLIN HFA) 108 (90 Base) MCG/ACT inhaler Inhale 2 puffs into the lungs every 6 (six) hours as needed for wheezing. 6.7 g 11   aspirin EC 81 MG tablet Take 81 mg by mouth daily.     atenolol (TENORMIN) 50 MG tablet Take 1 tablet (50 mg total) by mouth 2 (two) times daily. 180 tablet 1   atorvastatin (LIPITOR) 40 MG tablet Take 1 tablet (40 mg total) by mouth daily. 90 tablet 3   BD PEN NEEDLE NANO 2ND GEN 32G X 4 MM MISC USE DAILY AS DIRECTED WITH LEVEMIR 100 each 1   gabapentin (NEURONTIN) 300 MG capsule Take 1 capsule (300 mg total) by mouth 4 (four) times daily. 120 capsule 1   glucose blood (ONETOUCH VERIO) test strip Use as instructed to check  blood sugars 3 times daily 100 strip 12   insulin degludec (TRESIBA FLEXTOUCH) 100 UNIT/ML FlexTouch Pen Inject 14 Units into the skin daily. 15 mL 1   insulin detemir (LEVEMIR FLEXPEN) 100 UNIT/ML FlexPen Inject 16 Units into the skin daily. 15 mL 1   levothyroxine (SYNTHROID) 100 MCG tablet Take 1 tablet (100 mcg total) by mouth daily. 90 tablet 3   losartan (COZAAR) 50 MG tablet TAKE 1 TABLET BY MOUTH EVERY DAY 90 tablet 3   meloxicam (MOBIC) 7.5 MG tablet TAKE 1 TABLET BY MOUTH EVERY DAY 30 tablet 2   metFORMIN (GLUCOPHAGE) 1000 MG tablet TAKE 1 TABLET (1,000 MG TOTAL) BY MOUTH TWICE A DAY WITH FOOD 180 tablet 1   omeprazole (PRILOSEC) 20 MG capsule Take 1 capsule (20 mg total) by mouth daily. 30 capsule 1   OneTouch Delica Lancets 30G MISC 1 application by Does not apply route 2 (two) times daily. Use to check blood sugar up to two times daily. E11.69 100 each 11   temazepam (RESTORIL) 15 MG capsule TAKE 1 CAPSULE BY MOUTH AT BEDTIME AS NEEDED  FOR SLEEP 30 capsule 5   traMADol (ULTRAM) 50 MG tablet Take 1 tablet (50 mg total) by mouth every 6 (six) hours as needed. TAKE 1 TABLET BY MOUTH EVERY 6 HOURS AS NEEDED FOR musculoskeletal  PAIN 90 tablet 0   vitamin B-12 (CYANOCOBALAMIN) 500 MCG tablet Take 500 mcg by mouth daily.     Semaglutide,0.25 or 0.5MG /DOS, (OZEMPIC, 0.25 OR 0.5 MG/DOSE,) 2 MG/3ML SOPN Inject 0.5 mg into the skin once a week. (Patient not taking: Reported on 10/12/2022) 3 mL 2   amoxicillin-clavulanate (AUGMENTIN) 875-125 MG tablet Take 1 tablet by mouth 2 (two) times daily. (Patient not taking: Reported on 10/12/2022) 20 tablet 0   predniSONE (DELTASONE) 10 MG tablet Take 4 tablets ( total 40 mg) by mouth for 2 days; take 3 tablets ( total 30 mg) by mouth for 2 days; take 2 tablets ( total 20 mg) by mouth for 1 day; take 1 tablet ( total 10 mg) by mouth for 1 day. (Patient not taking: Reported on 10/12/2022) 17 tablet 0   Facility-Administered Medications Prior to Visit  Medication  Dose Route Frequency Provider Last Rate Last Admin   ipratropium (ATROVENT) nebulizer solution 0.5 mg  0.5 mg Nebulization Once Kara Dies, NP        Review of Systems;  Patient denies headache, fevers, malaise, unintentional weight loss, skin rash, eye pain, sinus congestion and sinus pain, sore throat, dysphagia,  hemoptysis , cough, dyspnea, wheezing, chest pain, palpitations, orthopnea, edema, abdominal pain, nausea, melena, diarrhea, constipation, flank pain, dysuria, hematuria, urinary  Frequency, nocturia, numbness, tingling, seizures,  Focal weakness, Loss of consciousness,  Tremor, insomnia, depression, anxiety, and suicidal ideation.      Objective:  BP (!) 150/88   Pulse 75   Temp 98 F (36.7 C) (Oral)   Ht 5\' 4"  (1.626 m)   Wt 151 lb 6.4 oz (68.7 kg)   SpO2 96%   BMI 25.99 kg/m   BP Readings from Last 3 Encounters:  10/12/22 (!) 150/88  09/25/22 130/70  09/04/22 (!) 162/84    Wt Readings from Last 3 Encounters:  10/12/22 151 lb 6.4 oz (68.7 kg)  09/25/22 146 lb (66.2 kg)  09/14/22 150 lb (68 kg)    Physical Exam Vitals reviewed.  Constitutional:      General: She is not in acute distress.    Appearance: Normal appearance. She is normal weight. She is not ill-appearing, toxic-appearing or diaphoretic.  HENT:     Head: Normocephalic.  Eyes:     General: No scleral icterus.       Right eye: No discharge.        Left eye: No discharge.     Conjunctiva/sclera: Conjunctivae normal.  Cardiovascular:     Rate and Rhythm: Normal rate and regular rhythm.     Heart sounds: Normal heart sounds.  Pulmonary:     Effort: Pulmonary effort is normal. No respiratory distress.     Breath sounds: Normal breath sounds.  Musculoskeletal:        General: Normal range of motion.  Skin:    General: Skin is warm and dry.  Neurological:     General: No focal deficit present.     Mental Status: She is alert and oriented to person, place, and time. Mental status is at  baseline.  Psychiatric:        Mood and Affect: Mood normal.        Behavior: Behavior normal.  Thought Content: Thought content normal.        Judgment: Judgment normal.     Lab Results  Component Value Date   HGBA1C 8.0 (H) 09/04/2022   HGBA1C 6.1 (H) 03/20/2022   HGBA1C 6.9 (A) 11/17/2021    Lab Results  Component Value Date   CREATININE 0.83 09/04/2022   CREATININE 0.95 03/20/2022   CREATININE 0.84 11/17/2021    Lab Results  Component Value Date   WBC 10.6 (H) 09/04/2022   HGB 12.1 09/04/2022   HCT 36.7 09/04/2022   PLT 365.0 09/04/2022   GLUCOSE 112 (H) 09/04/2022   CHOL 121 09/04/2022   TRIG 80.0 09/04/2022   HDL 60.60 09/04/2022   LDLDIRECT 47.0 09/04/2022   LDLCALC 44 09/04/2022   ALT 11 09/04/2022   AST 14 09/04/2022   NA 142 09/04/2022   K 4.6 09/04/2022   CL 103 09/04/2022   CREATININE 0.83 09/04/2022   BUN 14 09/04/2022   CO2 30 09/04/2022   TSH 1.15 05/08/2022   INR 0.88 02/17/2018   HGBA1C 8.0 (H) 09/04/2022   MICROALBUR 4.0 03/20/2022    No results found.  Assessment & Plan:  .Type 2 diabetes mellitus with diabetic neuropathy, with long-term current use of insulin Assessment & Plan: Uncontrolled recently with CBFGS > 250 for several days due to prednisone  use, with lows overnight. Reduce Levemir to 14 units and add aspart 5 units at dinner time       I provided 30 minutes of face-to-face time during this encounter reviewing patient's last visit with me, patient's  most recent visit with cardiology,  nephrology,  and neurology,  recent surgical and non surgical procedures, previous  labs and imaging studies, counseling on currently addressed issues,  and post visit ordering to diagnostics and therapeutics .   Follow-up: Return in about 2 months (around 12/12/2022) for follow up diabetes.   Sherlene Shams, MD

## 2022-10-12 NOTE — Progress Notes (Signed)
Care Coordination Call  Met with patient while in office, collected patient and provider signatures for [employer] assistance. Sending to CPhT for submission.   [name], PharmD, BCACP, CPP [facility] [phone]

## 2022-10-12 NOTE — Assessment & Plan Note (Addendum)
Uncontrolled recently with CBGS > 250 for several days due to prednisone  use, with lows overnight. Reduce Levemir to 14 units and add aspart 5 units at dinner time

## 2022-10-12 NOTE — Telephone Encounter (Signed)
Left message on voicemail for patient to call back and schedule appointment with Dr Logan Bores , patient was suppose to have diabetic shoes and inserts ordered a year ago with Arlys John not sure what happened.

## 2022-10-12 NOTE — Telephone Encounter (Signed)
Medication Samples have been provided to the patient.  Drug name: insulin Aspart Flexpen       Strength: U-100        Qty: 1 box  LOT: [id number]  Exp.Date: 05/05/2024  Dosing instructions: 5 units with dinner as needed.   The patient has been instructed regarding the correct time, dose, and frequency of taking this medication, including desired effects and most common side effects.      2:21 PM 10/12/2022

## 2022-10-12 NOTE — Patient Instructions (Addendum)
Reduce levemir dose to 14 units daily to stop the lows in early morning    add 5 units of aspart (mealtime insulin)  right before dinner if pasta , potatoes or rice is being served  Dr Logan Bores' office will contact you about the foot mold

## 2022-10-13 ENCOUNTER — Telehealth: Payer: Self-pay

## 2022-10-13 NOTE — Telephone Encounter (Signed)
Submitted application for TRESIBA(INSULIN DEGLUDEC)  AND FIASP  to [employer] for patient assistance.   Phone: [phone]  Georga B[facility] Patient Advocate [phone]) [phone]

## 2022-10-19 NOTE — Telephone Encounter (Signed)
Received notification from [employer] regarding approval for [facility], AND FIASP.  Patient assistance approved from 10/16/2022 to 07/06/2023.  SCANNING LETTER OF APPROVAL IN TO MEDIA OF CHART.    Phone: [phone]  Maria Hinton Rx Patient Advocate [phone]) [phone]   PLEASES BE ADVISED.

## 2022-10-20 ENCOUNTER — Ambulatory Visit: Payer: Medicare HMO | Admitting: Podiatry

## 2022-10-20 ENCOUNTER — Encounter: Payer: Self-pay | Admitting: Podiatry

## 2022-10-20 DIAGNOSIS — E119 Type 2 diabetes mellitus without complications: Secondary | ICD-10-CM

## 2022-10-20 NOTE — Progress Notes (Signed)
Chief Complaint  Patient presents with   Diabetes    "They had me to come in today to have my feet pressed down in the foam for Diabetic shoes."    HPI: 77 y.o. female PMHx diabetes mellitus presenting today for routine annual diabetic foot exam.  Patient was last seen in the office on 09/16/2021.  At that time order was placed for diabetic shoes with custom molded insoles.  She was subsequently molded on 09/29/2021 here in our office by our Pedorthist however unfortunately the order was never processed and she never received her diabetic shoes.  The patient is not very concerned about the diabetic shoes and states that she has been getting by just fine wearing her sketchers arch fit shoes and insoles.  She was referred back here to our office by her PCP for routine diabetic foot exam and to pursue diabetic shoes and insoles  Past Medical History:  Diagnosis Date   Actinic keratoses    follows with dermatology    Arthritis    Asthma    Chronic back pain    DDD,herniated disc/spondylosis/radiculopathy   COPD (chronic obstructive pulmonary disease)    Diabetes mellitus    1980s   Early cataracts, bilateral    Dr. Jerolyn Center - [facility] [place]   GERD (gastroesophageal reflux disease)    takes Omeprazole daily   Hemorrhoids    History of colonic polyps    colonoscopy 04/2012 - Dr Bluford Kaufmann   Hyperlipidemia    takes Pravastatin daily   Hypertension    takes Metoprolol/HCTZ daily   Hyperthyroidism    Insomnia    Joint pain    fingers   Joint swelling    Pneumonia    x 2 ;in Dec 2010/2011   PONV (postoperative nausea and vomiting)    Right leg weakness     Past Surgical History:  Procedure Laterality Date   ABDOMINAL HYSTERECTOMY     at age 31   BACK SURGERY  20+yrs ago   CATARACT EXTRACTION W/PHACO Left 01/04/2019   Procedure: CATARACT EXTRACTION PHACO AND INTRAOCULAR LENS PLACEMENT (IOC)  LEFT;  Surgeon: Lockie Mola, MD;  Location: [facility];  Service:  Ophthalmology;  Laterality: Left;  GIVE IV ZOFRAN   CATARACT EXTRACTION W/PHACO Right 02/01/2019   Procedure: CATARACT EXTRACTION PHACO AND INTRAOCULAR LENS PLACEMENT (IOC)  RIGHT DIABETIC;  Surgeon: Lockie Mola, MD;  Location: [facility];  Service: Ophthalmology;  Laterality: Right;  DIABETIC GIVE IV ZOFRAN   COLONOSCOPY     COLONOSCOPY N/A 05/13/2015   Procedure: COLONOSCOPY;  Surgeon: Wallace Cullens, MD;  Location: [facility];  Service: Gastroenterology;  Laterality: N/A;   COLONOSCOPY WITH PROPOFOL N/A 05/10/2020   Procedure: COLONOSCOPY WITH PROPOFOL;  Surgeon: Pasty Spillers, MD;  Location: [facility] ENDOSCOPY;  Service: Endoscopy;  Laterality: N/A;   ESOPHAGOGASTRODUODENOSCOPY  2011   ESOPHAGOGASTRODUODENOSCOPY (EGD) WITH PROPOFOL N/A 09/02/2017   Procedure: ESOPHAGOGASTRODUODENOSCOPY (EGD) WITH PROPOFOL;  Surgeon: Pasty Spillers, MD;  Location: [facility] ENDOSCOPY;  Service: Endoscopy;  Laterality: N/A;   FOOT SURGERY  2011   bunionectomy and knot removed from bottom of both feet   JOINT REPLACEMENT Left 2014   hip  Kraskinski   LUMBAR LAMINECTOMY/DECOMPRESSION MICRODISCECTOMY  09/10/2011   Procedure: LUMBAR LAMINECTOMY/DECOMPRESSION MICRODISCECTOMY 1 LEVEL;  Surgeon: Hewitt Shorts, MD;  Location: [facility];  Service: Neurosurgery;  Laterality: Right;  RIGHT Lumbar  Laminotomy and microdiskectomy Lumbar Four-Five   LUMBAR LAMINECTOMY/DECOMPRESSION MICRODISCECTOMY N/A 06/26/2015   Procedure:  LUMBAR LAMINECTOMY/DECOMPRESSION MICRODISCECTOMY 1 LEVEL;  Surgeon: Shirlean Kelly, MD;  Location: [facility];  Service: Neurosurgery;  Laterality: N/A;  L3 and L4 Laminectomies   MELANOMA EXCISION  2011   on nose/back on neck   THORACIC DISC SURGERY  at age 33 and 77    mass of veins that had gotten into muscle and wrapped around rib;3 ribs also removed from left side   TONSILLECTOMY  as a child   and adenoids    TOTAL HIP ARTHROPLASTY Left 2014   Dr. Martha Clan   TUBAL  LIGATION     VAGINA SURGERY     vaginal wall ruptured     Allergies  Allergen Reactions   Vicodin [Hydrocodone-Acetaminophen] Other (See Comments)    Insomnia    Acetaminophen     Other reaction(s): Insomnia   Vicodin Hp [Hydrocodone-Acetaminophen]      Physical Exam: General: The patient is alert and oriented x3 in no acute distress.  Dermatology: Skin is cool, dry and supple bilateral lower extremities.   Vascular: Palpable pedal pulses bilaterally.  Capillary refill is delayed.  No edema or erythema noted to the feet.  Toes are cold to touch.  Suspect small vessel disease since her pulses are nice and palpable.  Neurological: Diminished via light touch  Musculoskeletal Exam: No pedal deformities noted.  There is very mild reducible hammertoe deformities noted to the bilateral feet.  No pain or tenderness with palpation to the feet   Assessment/Plan of Care: 1.  Diabetes mellitus due to underlying condition with diabetic autonomic neuropathy. 2.  Encounter for diabetic foot exam  -Patient evaluated.  Comprehensive diabetic foot exam performed today -After discussing with the patient the diabetic shoes with custom molded Plastizote insoles she would rather not have the diabetic shoes.  She states that she is perfectly happy wearing the sketchers arch fit shoes.  Continue -If the patient would like to pursue the diabetic shoes she can set up an appointment with our diabetic shoe department for new moldings -Return to clinic annually     Felecia Shelling, DPM [facility]  Dr. Felecia Shelling, DPM    [address] [place], [place] [address]                Office [phone]  Fax [phone]

## 2022-10-22 ENCOUNTER — Encounter: Payer: Self-pay | Admitting: Internal Medicine

## 2022-10-27 ENCOUNTER — Other Ambulatory Visit: Payer: Self-pay | Admitting: Internal Medicine

## 2022-10-27 ENCOUNTER — Other Ambulatory Visit: Payer: Self-pay | Admitting: *Deleted

## 2022-10-27 DIAGNOSIS — K2100 Gastro-esophageal reflux disease with esophagitis, without bleeding: Secondary | ICD-10-CM

## 2022-10-27 MED ORDER — OMEPRAZOLE 20 MG PO CPDR: 20.0000 mg | DELAYED_RELEASE_CAPSULE | Freq: Every day | ORAL | 1 refills | Status: DC

## 2022-10-30 ENCOUNTER — Telehealth: Payer: Self-pay

## 2022-10-30 MED ORDER — FIASP FLEXTOUCH 100 UNIT/ML ~~LOC~~ SOPN: PEN_INJECTOR | SUBCUTANEOUS | 1 refills | Status: DC

## 2022-10-30 NOTE — Addendum Note (Signed)
Addended by: Nilda Simmer T on: 10/30/2022 03:14 PM   Modules accepted: Orders

## 2022-10-30 NOTE — Telephone Encounter (Signed)
Patient returned office phone call. Patient will pick up on Monday, 11/02/22.

## 2022-10-30 NOTE — Telephone Encounter (Signed)
noted 

## 2022-10-30 NOTE — Telephone Encounter (Signed)
LMTCB need to let pt know that we have received her pt assistance medication.    Received patient assistance medication and it is ready for pick up.   Ozempic: 5 boxes Tresiba: 2 boxes Fiasp: 1 box Pen needles: 3 boxes

## 2022-11-02 ENCOUNTER — Telehealth: Payer: Self-pay

## 2022-11-02 NOTE — Telephone Encounter (Signed)
Pt came into office to pick up pt assitance medication @ 2:10 pm on 11/02/22

## 2022-11-09 ENCOUNTER — Other Ambulatory Visit: Payer: Medicare HMO | Admitting: Pharmacist

## 2022-11-16 ENCOUNTER — Other Ambulatory Visit: Payer: Medicare HMO | Admitting: Pharmacist

## 2022-11-16 NOTE — Progress Notes (Signed)
11/16/2022 Name: Maria Hinton MRN: [id number] DOB: 1946/03/11  Chief Complaint  Patient presents with   Medication Management   Diabetes    Maria Hinton is a 77 y.o. year old female who presented for a telephone visit.   They were referred to the pharmacist by their PCP for assistance in managing diabetes and medication access.    Subjective:  Care Team: Primary Care Provider: Sherlene Shams, MD ; Next Scheduled Visit: 6/10/8   Medication Access/Adherence  Current Pharmacy:  [facility] [address], [place] - [address] [place] [address] Phone: [phone] Fax: [phone]  [id number] - [place], [place] - [address] [place] [address] Phone: [phone] Fax: [phone]   Patient reports affordability concerns with their medications: No  Patient reports access/transportation concerns to their pharmacy: No  Patient reports adherence concerns with their medications:  No     Diabetes:  Current medications: metformin 1000 mg twice daily, Ozempic 0.25 mg weekly, completing supply of Levemir before transitioning to [place] 14 units daily; Fiasp 5 units with supper with higher carb meal  Date of Download: 4/30-5/13/24 % Time CGM is active: 76% Average Glucose: 128 mg/dL Glucose Management Indicator: 6.4  Glucose Variability: 40.9 (goal <36%) Time in Goal:  - Time in range 70-180: 77% - Time above range: 15% - Time below range: 8%   Patient reports hypoglycemic s/sx including dizziness, shakiness, sweating. Patient denies hyperglycemic symptoms including polyuria, polydipsia, polyphagia, nocturia, neuropathy, blurred vision.  Does report more frequent issues with urinary incontinence. Notes often waking up, sanding up and filling depends. Sometimes does not feel urge. Some lower back discomfort/pain on either side periodically.   Hypertension:  Current medications: losartan 50 mg daily, atenolol 50 mg twice  daily  Patient has a validated, automated, upper arm home BP cuff. Has not been checking any home readings lately. Has been packing up to move, under stress.   Patient denies hypotensive s/sx including dizziness, lightheadedness.  Patient denies hypertensive symptoms including headache, chest pain, shortness of breath    Hyperlipidemia/ASCVD Risk Reduction  Current lipid lowering medications: atorvastatin 40 mg daily   Objective:  Lab Results  Component Value Date   HGBA1C 8.0 (H) 09/04/2022    Lab Results  Component Value Date   CREATININE 0.83 09/04/2022   BUN 14 09/04/2022   NA 142 09/04/2022   K 4.6 09/04/2022   CL 103 09/04/2022   CO2 30 09/04/2022    Lab Results  Component Value Date   CHOL 121 09/04/2022   HDL 60.60 09/04/2022   LDLCALC 44 09/04/2022   LDLDIRECT 47.0 09/04/2022   TRIG 80.0 09/04/2022   CHOLHDL 2 09/04/2022    Medications Reviewed Today     Reviewed by Alden Hipp, RPH-CPP (Pharmacist) on 11/16/22 at 1505  Med List Status: <None>   Medication Order Taking? Sig Documenting Provider Last Dose Status Informant  albuterol (VENTOLIN HFA) 108 (90 Base) MCG/ACT inhaler [id number]  Inhale 2 puffs into the lungs every 6 (six) hours as needed for wheezing. Sherlene Shams, MD  Active   aspirin EC 81 MG tablet [id number]  Take 81 mg by mouth daily. [provider]  Active Self  atenolol (TENORMIN) 50 MG tablet [id number] Yes Take 1 tablet (50 mg total) by mouth 2 (two) times daily. Sherlene Shams, MD Taking Active   atorvastatin (LIPITOR) 40 MG tablet [id number] Yes Take 1 tablet (40 mg total)  by mouth daily. Sherlene Shams, MD Taking Active   BD PEN NEEDLE NANO 2ND GEN 32G X 4 MM MISC [id number]  USE DAILY AS DIRECTED WITH LEVEMIR Sherlene Shams, MD  Active   gabapentin (NEURONTIN) 300 MG capsule [id number]  Take 1 capsule (300 mg total) by mouth 4 (four) times daily. Sherlene Shams, MD  Active   glucose blood [facility] VERIO) test strip  [id number]  Use as instructed to check blood sugars 3 times daily Sherlene Shams, MD  Active   insulin aspart (FIASP FLEXTOUCH) 100 UNIT/ML FlexTouch Pen [id number] No Use 5 units with supper as needed  Patient not taking: Reported on 11/16/2022   Sherlene Shams, MD Not Taking Active   insulin degludec (TRESIBA FLEXTOUCH) 100 UNIT/ML FlexTouch Pen [id number] Yes Inject 14 Units into the skin daily. Sherlene Shams, MD Taking Active   ipratropium (ATROVENT) nebulizer solution 0.5 mg [id number]   Kara Dies, NP  Active   levothyroxine (SYNTHROID) 100 MCG tablet [id number] Yes Take 1 tablet (100 mcg total) by mouth daily. Sherlene Shams, MD Taking Active   losartan (COZAAR) 50 MG tablet [id number] Yes TAKE 1 TABLET BY MOUTH EVERY DAY Sherlene Shams, MD Taking Active   meloxicam (MOBIC) 7.5 MG tablet [id number] Yes TAKE 1 TABLET BY MOUTH EVERY DAY Worthy Rancher B, FNP Taking Active   metFORMIN (GLUCOPHAGE) 1000 MG tablet [id number] Yes TAKE 1 TABLET (1,000 MG TOTAL) BY MOUTH TWICE A DAY WITH FOOD Sherlene Shams, MD Taking Active   Omeprazole 20 MG TBEC [id number] Yes TAKE 1 TABLET BY MOUTH EVERY DAY Sherlene Shams, MD Taking Active   OneTouch Delica Lancets 30G MISC [id number]  1 application by Does not apply route 2 (two) times daily. Use to check blood sugar up to two times daily. E11.69 Sherlene Shams, MD  Active   Semaglutide,0.25 or 0.5MG /DOS, (OZEMPIC, 0.25 OR 0.5 MG/DOSE,) 2 MG/3ML SOPN [id number] Yes Inject 0.5 mg into the skin once a week. Sherlene Shams, MD Taking Active   temazepam (RESTORIL) 15 MG capsule [id number] Yes TAKE 1 CAPSULE BY MOUTH AT BEDTIME AS NEEDED FOR SLEEP Sherlene Shams, MD Taking Active   traMADol (ULTRAM) 50 MG tablet [id number] Yes Take 1 tablet (50 mg total) by mouth every 6 (six) hours as needed. TAKE 1 TABLET BY MOUTH EVERY 6 HOURS AS NEEDED FOR musculoskeletal  PAIN Sherlene Shams, MD Taking Active   vitamin B-12 (CYANOCOBALAMIN) 500 MCG tablet [id number] Yes Take  500 mcg by mouth daily. [provider] Taking Active               Assessment/Plan:   Diabetes: - Currently uncontrolled but improving.  - Reviewed long term cardiovascular and renal outcomes of uncontrolled blood sugar - Reviewed goal A1c, goal fasting, and goal 2 hour post prandial glucose - Recommend to reduce Levemir (and Tresiba when she finishes Levemir supply) to 10 units daily to reduce risk of hypoglycemia. Continue Ozempic 0.25 mg weekly for a total of 4 weeks, then increase to 0.5 mg weekly. Continue metformin.  - Counseled to contact office or myself if development of hypoglycemia.  - Recommend to check glucose using CGM  Hypertension: - Currently uncontrolled - Reviewed long term cardiovascular and renal outcomes of uncontrolled blood pressure - Reviewed appropriate blood pressure monitoring technique and reviewed goal blood pressure. Recommended to check home blood pressure and heart rate periodically - Recommend to continue current regimen. Will review home readings with  our next appointment    Hyperlipidemia/ASCVD Risk Reduction: - Currently controlled.  - Recommend to continue current regimen    Follow Up Plan: phone call in 4 weeks  [name], PharmD, BCACP, CPP [facility] [phone]

## 2022-11-16 NOTE — Patient Instructions (Signed)
Maria Hinton,   It was great talking to you today!  Continue Ozempic 0.25 mg weekly for a total of 4 weeks, then increase to 0.5 mg weekly. Please decrease your insulin (Levemir now until you finish the supply, then [place]) 10 units daily. Continue metformin 1000 mg twice daily.   Please call me if you continue to have any low blood sugar readings less than 70.   Check your blood pressure twice weekly, and any time you have concerning symptoms like headache, chest pain, dizziness, shortness of breath, or vision changes.   Our goal is less than 130/80.  To appropriately check your blood pressure, make sure you do the following:  1) Avoid caffeine, exercise, or tobacco products for 30 minutes before checking. Empty your bladder. 2) Sit with your back supported in a flat-backed chair. Rest your arm on something flat (arm of the chair, table, etc). 3) Sit still with your feet flat on the floor, resting, for at least 5 minutes.  4) Check your blood pressure. Take 1-2 readings.  5) Write down these readings and bring with you to any provider appointments.  Bring your home blood pressure machine with you to a provider's office for accuracy comparison at least once a year.   Make sure you take your blood pressure medications before you come to any office visit, even if you were asked to fast for labs.   Take care!  Maria Hinton, PharmD, BCACP, CPP [facility] [phone]

## 2022-11-20 ENCOUNTER — Other Ambulatory Visit: Payer: Self-pay | Admitting: Internal Medicine

## 2022-11-23 NOTE — Telephone Encounter (Signed)
Refilled: 10/11/2022 Last OV: 10/12/2022 Next OV: 12/14/2022

## 2022-12-04 ENCOUNTER — Other Ambulatory Visit: Payer: Self-pay | Admitting: Internal Medicine

## 2022-12-07 ENCOUNTER — Ambulatory Visit
Admission: RE | Admit: 2022-12-07 | Discharge: 2022-12-07 | Disposition: A | Discharge status: Home / Self Care | Payer: Medicare HMO | Source: Ambulatory Visit | Attending: Internal Medicine | Admitting: Internal Medicine

## 2022-12-07 DIAGNOSIS — Z1231 Encounter for screening mammogram for malignant neoplasm of breast: Secondary | ICD-10-CM | POA: Diagnosis present

## 2022-12-14 ENCOUNTER — Encounter: Payer: Self-pay | Admitting: Internal Medicine

## 2022-12-14 ENCOUNTER — Ambulatory Visit: Payer: Medicare HMO | Admitting: Internal Medicine

## 2022-12-14 VITALS — BP 168/86 | HR 65 | Temp 98.1°F | Ht 64.0 in | Wt 153.4 lb

## 2022-12-14 DIAGNOSIS — I10 Essential (primary) hypertension: Secondary | ICD-10-CM | POA: Diagnosis not present

## 2022-12-14 DIAGNOSIS — Z794 Long term (current) use of insulin: Secondary | ICD-10-CM

## 2022-12-14 DIAGNOSIS — R1319 Other dysphagia: Secondary | ICD-10-CM

## 2022-12-14 DIAGNOSIS — E785 Hyperlipidemia, unspecified: Secondary | ICD-10-CM

## 2022-12-14 DIAGNOSIS — E538 Deficiency of other specified B group vitamins: Secondary | ICD-10-CM | POA: Diagnosis not present

## 2022-12-14 DIAGNOSIS — K209 Esophagitis, unspecified without bleeding: Secondary | ICD-10-CM

## 2022-12-14 DIAGNOSIS — E1169 Type 2 diabetes mellitus with other specified complication: Secondary | ICD-10-CM

## 2022-12-14 DIAGNOSIS — E1140 Type 2 diabetes mellitus with diabetic neuropathy, unspecified: Secondary | ICD-10-CM | POA: Diagnosis not present

## 2022-12-14 DIAGNOSIS — E039 Hypothyroidism, unspecified: Secondary | ICD-10-CM | POA: Diagnosis not present

## 2022-12-14 DIAGNOSIS — R32 Unspecified urinary incontinence: Secondary | ICD-10-CM | POA: Diagnosis not present

## 2022-12-14 MED ORDER — OMEPRAZOLE 40 MG PO CPDR: 40.0000 mg | DELAYED_RELEASE_CAPSULE | Freq: Every day | ORAL | 2 refills | Status: DC

## 2022-12-14 MED ORDER — LOSARTAN POTASSIUM 100 MG PO TABS: 100.0000 mg | ORAL_TABLET | Freq: Every day | ORAL | 1 refills | Status: DC

## 2022-12-14 MED ORDER — FIASP FLEXTOUCH 100 UNIT/ML ~~LOC~~ SOPN: PEN_INJECTOR | SUBCUTANEOUS | 1 refills | Status: DC

## 2022-12-14 NOTE — Progress Notes (Addendum)
Subjective:  Patient ID: Maria Hinton, female    DOB: February 13, 1946  Age: 77 y.o. MRN: [id number]  CC: The primary encounter diagnosis was Esophageal dysphagia. Diagnoses of HTN (hypertension), benign, Hyperlipidemia associated with type 2 diabetes mellitus (HCC), Type 2 diabetes mellitus with diabetic neuropathy, with long-term current use of insulin (HCC), Acquired hypothyroidism, Urinary incontinence, unspecified type, and B12 deficiency were also pertinent to this visit.   HPI AMILYAH WOEHL presents for  Chief Complaint  Patient presents with   Medical Management of Chronic Issues    2 month follow up on diabetes   1) type 2 DM:  aspart 5 units  has been  added at dinner time,br she has only been adding it  for pasta/potato  meals.  tresiba reduced from 16 to 14 units I have downloaded and reviewed the data from patient's continuous blood glucose monitor. Patient's  sugars have been  IN RANGE  86   % OF THE TIME,   BELOW RANGE 1   % of the time.  And ABOVE RANGE  13 % OF THE TIME .  Medication changes were made based on this review as follows:    Lab Results  Component Value Date   HGBA1C 7.0 (H) 12/14/2022    2) Hands hurting in all joints,   for the past several months (since last visit)   PIPs   swelling but not hot.    3)  diabetic neuropathy:   feet feel like pins and needles.  Worse at night/    taking gabapentin 300 mg every 6 hours  and b12 oral supplements   3) urinary  incontinence getting worse,  now has urge incontinence as well as stress .   Has control of bladder during the day and of  bowel  unless she is having diarrhea    4) dysphagia and post prandial  regurgitation one hour later.  Chronic for several years.  Last EGD years ago     Outpatient Medications Prior to Visit  Medication Sig Dispense Refill   albuterol (VENTOLIN HFA) 108 (90 Base) MCG/ACT inhaler Inhale 2 puffs into the lungs every 6 (six) hours as needed for wheezing. 6.7 g 11   aspirin EC 81  MG tablet Take 81 mg by mouth daily.     atenolol (TENORMIN) 50 MG tablet Take 1 tablet (50 mg total) by mouth 2 (two) times daily. 180 tablet 1   atorvastatin (LIPITOR) 40 MG tablet Take 1 tablet (40 mg total) by mouth daily. 90 tablet 3   BD PEN NEEDLE NANO 2ND GEN 32G X 4 MM MISC USE DAILY AS DIRECTED WITH LEVEMIR 100 each 1   gabapentin (NEURONTIN) 300 MG capsule Take 1 capsule (300 mg total) by mouth 4 (four) times daily. 120 capsule 1   glucose blood (ONETOUCH VERIO) test strip Use as instructed to check blood sugars 3 times daily 100 strip 12   insulin degludec (TRESIBA FLEXTOUCH) 100 UNIT/ML FlexTouch Pen Inject 14 Units into the skin daily. 15 mL 1   levothyroxine (SYNTHROID) 100 MCG tablet Take 1 tablet (100 mcg total) by mouth daily. 90 tablet 3   meloxicam (MOBIC) 7.5 MG tablet TAKE 1 TABLET BY MOUTH EVERY DAY 30 tablet 2   metFORMIN (GLUCOPHAGE) 1000 MG tablet TAKE 1 TABLET (1,000 MG TOTAL) BY MOUTH TWICE A DAY WITH FOOD 180 tablet 1   OneTouch Delica Lancets 30G MISC 1 application by Does not apply route 2 (two) times daily. Use to  check blood sugar up to two times daily. E11.69 100 each 11   Semaglutide,0.25 or 0.5MG /DOS, (OZEMPIC, 0.25 OR 0.5 MG/DOSE,) 2 MG/3ML SOPN Inject 0.5 mg into the skin once a week. 3 mL 2   temazepam (RESTORIL) 15 MG capsule TAKE 1 CAPSULE BY MOUTH AT BEDTIME AS NEEDED FOR SLEEP 30 capsule 5   traMADol (ULTRAM) 50 MG tablet TAKE 1 TABLET BY MOUTH EVERY 6 (SIX) HOURS AS NEEDED FOR MUSCULOSKELETAL PAIN 90 tablet 0   vitamin B-12 (CYANOCOBALAMIN) 500 MCG tablet Take 500 mcg by mouth daily.     losartan (COZAAR) 50 MG tablet TAKE 1 TABLET BY MOUTH EVERY DAY 90 tablet 3   omeprazole (PRILOSEC) 20 MG capsule Take 20 mg by mouth daily.     insulin aspart (FIASP FLEXTOUCH) 100 UNIT/ML FlexTouch Pen Use 5 units with supper as needed (Patient not taking: Reported on 11/16/2022) 15 mL 1   Omeprazole 20 MG TBEC TAKE 1 TABLET BY MOUTH EVERY DAY (Patient not taking:  Reported on 12/14/2022) 90 tablet 1   Facility-Administered Medications Prior to Visit  Medication Dose Route Frequency Provider Last Rate Last Admin   ipratropium (ATROVENT) nebulizer solution 0.5 mg  0.5 mg Nebulization Once Kara Dies, NP        Review of Systems;  Patient denies headache, fevers, malaise, unintentional weight loss, skin rash, eye pain, sinus congestion and sinus pain, sore throat, dysphagia,  hemoptysis , cough, dyspnea, wheezing, chest pain, palpitations, orthopnea, edema, abdominal pain, nausea, melena, diarrhea, constipation, flank pain, dysuria, hematuria, urinary  Frequency, nocturia, numbness, tingling, seizures,  Focal weakness, Loss of consciousness,  Tremor, insomnia, depression, anxiety, and suicidal ideation.      Objective:  BP (!) 168/86   Pulse 65   Temp 98.1 F (36.7 C) (Oral)   Ht 5\' 4"  (1.626 m)   Wt 153 lb 6.4 oz (69.6 kg)   SpO2 96%   BMI 26.33 kg/m   BP Readings from Last 3 Encounters:  12/14/22 (!) 168/86  10/12/22 (!) 150/88  09/25/22 130/70    Wt Readings from Last 3 Encounters:  12/14/22 153 lb 6.4 oz (69.6 kg)  10/12/22 151 lb 6.4 oz (68.7 kg)  09/25/22 146 lb (66.2 kg)    Physical Exam Vitals reviewed.  Constitutional:      General: She is not in acute distress.    Appearance: Normal appearance. She is normal weight. She is not ill-appearing, toxic-appearing or diaphoretic.  HENT:     Head: Normocephalic.  Eyes:     General: No scleral icterus.       Right eye: No discharge.        Left eye: No discharge.     Conjunctiva/sclera: Conjunctivae normal.  Cardiovascular:     Rate and Rhythm: Normal rate and regular rhythm.     Heart sounds: Normal heart sounds.  Pulmonary:     Effort: Pulmonary effort is normal. No respiratory distress.     Breath sounds: Normal breath sounds.  Musculoskeletal:        General: Normal range of motion.  Skin:    General: Skin is warm and dry.  Neurological:     General: No focal  deficit present.     Mental Status: She is alert and oriented to person, place, and time. Mental status is at baseline.  Psychiatric:        Mood and Affect: Mood normal.        Behavior: Behavior normal.  Thought Content: Thought content normal.        Judgment: Judgment normal.    Lab Results  Component Value Date   HGBA1C 7.0 (H) 12/14/2022   HGBA1C 8.0 (H) 09/04/2022   HGBA1C 6.1 (H) 03/20/2022    Lab Results  Component Value Date   CREATININE 1.21 (H) 12/14/2022   CREATININE 0.83 09/04/2022   CREATININE 0.95 03/20/2022    Lab Results  Component Value Date   WBC 10.6 (H) 09/04/2022   HGB 12.1 09/04/2022   HCT 36.7 09/04/2022   PLT 365.0 09/04/2022   GLUCOSE 98 12/14/2022   CHOL 179 12/14/2022   TRIG 199.0 (H) 12/14/2022   HDL 62.90 12/14/2022   LDLDIRECT 95.0 12/14/2022   LDLCALC 76 12/14/2022   ALT 12 12/14/2022   AST 18 12/14/2022   NA 141 12/14/2022   K 4.8 12/14/2022   CL 101 12/14/2022   CREATININE 1.21 (H) 12/14/2022   BUN 18 12/14/2022   CO2 29 12/14/2022   TSH 3.49 12/14/2022   INR 0.88 02/17/2018   HGBA1C 7.0 (H) 12/14/2022   MICROALBUR 4.0 03/20/2022    Mammogram 3D SCREEN BREAST BILATERAL  Result Date: 12/08/2022 CLINICAL DATA:  Screening. EXAM: DIGITAL SCREENING BILATERAL MAMMOGRAM WITH TOMOSYNTHESIS AND CAD TECHNIQUE: Bilateral screening digital craniocaudal and mediolateral oblique mammograms were obtained. Bilateral screening digital breast tomosynthesis was performed. The images were evaluated with computer-aided detection. COMPARISON:  Previous exam(s). ACR Breast Density Category b: There are scattered areas of fibroglandular density. FINDINGS: There are no findings suspicious for malignancy. IMPRESSION: No mammographic evidence of malignancy. A result letter of this screening mammogram will be mailed directly to the patient. RECOMMENDATION: Screening mammogram in one year. (Code:SM-B-01Y) BI-RADS CATEGORY  1: Negative. Electronically  Signed   By: Norva Pavlov M.D.   On: 12/08/2022 13:36    Assessment & Plan:  .Esophageal dysphagia -     DG ESOPHAGUS W SINGLE CM (SOL OR THIN BA); Future  HTN (hypertension), benign -     Comprehensive metabolic panel  Hyperlipidemia associated with type 2 diabetes mellitus (HCC) -     Lipid panel -     LDL cholesterol, direct  Type 2 diabetes mellitus with diabetic neuropathy, with long-term current use of insulin (HCC) -     Hemoglobin A1c -     Comprehensive metabolic panel  Acquired hypothyroidism -     TSH  Urinary incontinence, unspecified type -     Urinalysis, Routine w reflex microscopic -     Urine Culture  B12 deficiency -     B12 and Folate Panel -     Intrinsic Factor Antibodies  Other orders -     Omeprazole; Take 1 capsule (40 mg total) by mouth daily.  Dispense: 90 capsule; Refill: 2 -     Fiasp FlexTouch; Use 5 units with supper  for BS >140, 10 if eating pasta  Dispense: 15 mL; Refill: 1 -     Losartan Potassium; Take 1 tablet (100 mg total) by mouth daily.  Dispense: 90 tablet; Refill: 1     In addition to the time spent reviewing data from the CBG monitor, I  provided 30 minutes of face-to-face time in addition to time spent revieweing the CBF monitor  during this encounter reviewing patient's last visit with me, patient's  most recent visit with cardiology, recent surgical and non surgical procedures, previous  labs and imaging studies, counseling on currently addressed issues,  and post visit ordering to diagnostics and therapeutics .  Follow-up: Return in about 3 months (around 03/16/2023).   Sherlene Shams, MD

## 2022-12-14 NOTE — Patient Instructions (Addendum)
1) I am ordering a swallow evaluation .  I have increased your omeprazole dose to 40 mg daily.  TAKE ON AN EMPTY STOMACH EVERY DAY  2) Urinalysis and culture today to rule out infection .  DO NOT DELAY YOUR BATHROOM TRIP WHEN YOU FEEL THE URGE   3) YOUR EVENING BLOOD SUGARS ARE HIGH   If BS is 140 or higher,  take 5 units of insulin with dinner,  and 10 uf dinner is pasta,, rice  or potatoes   If BS is < 140 before dinner  ONLY TAKE INSULIN  IF DINNER IS PASTA, RICE OR POTATOES    4) TAKE A 2ND GABAPENTIN DOSE AT  8 PM (600 MG TOTAL) . To see if it helps the neuropathy   5)( YOUR BLOOD PRESSURE IS TOO HIGH,  PLEASE INCREASE YOUR LOSARTAN TO 100 MG DAILY

## 2022-12-15 LAB — COMPREHENSIVE METABOLIC PANEL
ALT: 12 U/L (ref 0–35)
AST: 18 U/L (ref 0–37)
Albumin: 4 g/dL (ref 3.5–5.2)
Alkaline Phosphatase: 98 U/L (ref 39–117)
BUN: 18 mg/dL (ref 6–23)
CO2: 29 mEq/L (ref 19–32)
Calcium: 8.5 mg/dL (ref 8.4–10.5)
Chloride: 101 mEq/L (ref 96–112)
Creatinine, Ser: 1.21 mg/dL — ABNORMAL HIGH (ref 0.40–1.20)
GFR: 44.05 mL/min — ABNORMAL LOW (ref >60.00)
Glucose, Bld: 98 mg/dL (ref 70–99)
Potassium: 4.8 mEq/L (ref 3.5–5.1)
Sodium: 141 mEq/L (ref 135–145)
Total Bilirubin: 0.4 mg/dL (ref 0.2–1.2)
Total Protein: 6.9 g/dL (ref 6.0–8.3)

## 2022-12-15 LAB — URINE CULTURE: MICRO NUMBER:: 15062127

## 2022-12-15 LAB — B12 AND FOLATE PANEL
Folate: 8.7 ng/mL (ref >5.9)
Vitamin B-12: 642 pg/mL (ref 211–911)

## 2022-12-15 LAB — TSH: TSH: 3.49 u[IU]/mL (ref 0.35–5.50)

## 2022-12-15 LAB — URINALYSIS, ROUTINE W REFLEX MICROSCOPIC
Bilirubin Urine: NEGATIVE
Hgb urine dipstick: NEGATIVE
Ketones, ur: NEGATIVE
Nitrite: NEGATIVE
RBC / HPF: NONE SEEN (ref 0–?)
Specific Gravity, Urine: 1.02 (ref 1.000–1.030)
Total Protein, Urine: NEGATIVE
Urine Glucose: NEGATIVE
Urobilinogen, UA: 0.2 (ref 0.0–1.0)
pH: 6 (ref 5.0–8.0)

## 2022-12-15 LAB — LIPID PANEL
Cholesterol: 179 mg/dL (ref 0–200)
HDL: 62.9 mg/dL (ref >39.00)
LDL Cholesterol: 76 mg/dL (ref 0–99)
NonHDL: 115.67
Total CHOL/HDL Ratio: 3
Triglycerides: 199 mg/dL — ABNORMAL HIGH (ref 0.0–149.0)
VLDL: 39.8 mg/dL (ref 0.0–40.0)

## 2022-12-15 LAB — HEMOGLOBIN A1C: Hgb A1c MFr Bld: 7 % — ABNORMAL HIGH (ref 4.6–6.5)

## 2022-12-15 LAB — LDL CHOLESTEROL, DIRECT: Direct LDL: 95 mg/dL

## 2022-12-15 NOTE — Assessment & Plan Note (Signed)
she reports compliance with medication regimen  but has an elevated reading today in office.  She is not using NSAIDs daily.  Discussed goal of 120/70  (130/80 for patients over 70)  to preserve renal function. Increase losartan dose to 100 mg daily

## 2022-12-15 NOTE — Assessment & Plan Note (Addendum)
At last visit she wasa advised to reduce Levemir to 14 units and add aspart 5 units at dinner time .  Based on review of recent CBG monitor, she was advised to increase her mealtime insulin to 10 units

## 2022-12-15 NOTE — Assessment & Plan Note (Signed)
Now with dysphagia and regurgitation.  Stricture vs spasm.  Dg esophagus ordered.  Resume PPI

## 2022-12-16 LAB — URINE CULTURE: SPECIMEN QUALITY:: ADEQUATE

## 2022-12-16 LAB — INTRINSIC FACTOR ANTIBODIES: Intrinsic Factor: NEGATIVE

## 2022-12-17 NOTE — Addendum Note (Signed)
Addended by: Sherlene Shams on: 12/17/2022 12:34 PM   Modules accepted: Orders

## 2022-12-29 ENCOUNTER — Other Ambulatory Visit: Payer: Self-pay | Admitting: Internal Medicine

## 2022-12-31 ENCOUNTER — Other Ambulatory Visit (HOSPITAL_COMMUNITY): Payer: Medicare HMO

## 2022-12-31 ENCOUNTER — Encounter (HOSPITAL_COMMUNITY): Payer: Self-pay

## 2023-01-11 ENCOUNTER — Ambulatory Visit
Admission: RE | Admit: 2023-01-11 | Discharge: 2023-01-11 | Disposition: A | Discharge status: Home / Self Care | Payer: Medicare HMO | Source: Ambulatory Visit | Attending: Internal Medicine | Admitting: Internal Medicine

## 2023-01-11 DIAGNOSIS — R1319 Other dysphagia: Secondary | ICD-10-CM | POA: Insufficient documentation

## 2023-01-14 ENCOUNTER — Encounter: Payer: Self-pay | Admitting: Internal Medicine

## 2023-01-14 DIAGNOSIS — K224 Dyskinesia of esophagus: Secondary | ICD-10-CM | POA: Insufficient documentation

## 2023-01-15 ENCOUNTER — Ambulatory Visit: Payer: Medicare HMO | Admitting: Nurse Practitioner

## 2023-01-15 DIAGNOSIS — M542 Cervicalgia: Secondary | ICD-10-CM

## 2023-01-15 NOTE — Patient Instructions (Signed)
Continue

## 2023-01-15 NOTE — Progress Notes (Signed)
Established Patient Office Visit  Subjective:  Patient ID: Maria Hinton, female    DOB: [birth year]  Age: 77 y.o. MRN: [id number]  CC:  Chief Complaint  Patient presents with   Neck Pain    Neck pain and hurts to turn neck    HPI  Maria Hinton presents for neck pain that started Monday afternoon all of the sudden. She had no fall, did not pick anything heavy.   Have been to urgent care day before yesterday she had  Toradol injection at the urgent care and was started on flexeril 20 mg and diclofenac 75 mg. Had started taking flexeril yesterday after lunch.   She did see some improvement after taking the medication.   Denise fever or headache.   HPI   Past Medical History:  Diagnosis Date   Actinic keratoses    follows with dermatology    Arthritis    Asthma    Chronic back pain    DDD,herniated disc/spondylosis/radiculopathy   COPD (chronic obstructive pulmonary disease) (HCC)    Diabetes mellitus    1980s   Early cataracts, bilateral    Dr. Jerolyn Center - [facility] [place]   GERD (gastroesophageal reflux disease)    takes Omeprazole daily   Hemorrhoids    History of colonic polyps    colonoscopy 04/2012 - Dr Bluford Kaufmann   Hyperlipidemia    takes Pravastatin daily   Hypertension    takes Metoprolol/HCTZ daily   Hyperthyroidism    Insomnia    Joint pain    fingers   Joint swelling    Pneumonia    x 2 ;in Dec 2010/2011   PONV (postoperative nausea and vomiting)    Right leg weakness     Past Surgical History:  Procedure Laterality Date   ABDOMINAL HYSTERECTOMY     at age 78   BACK SURGERY  20+yrs ago   CATARACT EXTRACTION W/PHACO Left 01/04/2019   Procedure: CATARACT EXTRACTION PHACO AND INTRAOCULAR LENS PLACEMENT (IOC)  LEFT;  Surgeon: Lockie Mola, MD;  Location: [facility];  Service: Ophthalmology;  Laterality: Left;  GIVE IV ZOFRAN   CATARACT EXTRACTION W/PHACO Right 02/01/2019   Procedure: CATARACT EXTRACTION PHACO AND INTRAOCULAR LENS  PLACEMENT (IOC)  RIGHT DIABETIC;  Surgeon: Lockie Mola, MD;  Location: [facility];  Service: Ophthalmology;  Laterality: Right;  DIABETIC GIVE IV ZOFRAN   COLONOSCOPY     COLONOSCOPY N/A 05/13/2015   Procedure: COLONOSCOPY;  Surgeon: Wallace Cullens, MD;  Location: [facility] END[facility]COPY;  Service: Gastroenterology;  Laterality: N/A;   COLONOSCOPY WITH PROPOFOL N/A 05/10/2020   Procedure: COLONOSCOPY WITH PROPOFOL;  Surgeon: Pasty Spillers, MD;  Location: [facility] ENDOSCOPY;  Service: Endoscopy;  Laterality: N/A;   ESOPHAGOGASTRODUODENOSCOPY  2011   ESOPHAGOGASTRODUODENOSCOPY (EGD) WITH PROPOFOL N/A 09/02/2017   Procedure: ESOPHAGOGASTRODUODENOSCOPY (EGD) WITH PROPOFOL;  Surgeon: Pasty Spillers, MD;  Location: [facility] ENDOSCOPY;  Service: Endoscopy;  Laterality: N/A;   FOOT SURGERY  2011   bunionectomy and knot removed from bottom of both feet   JOINT REPLACEMENT Left 2014   hip  Kraskinski   LUMBAR LAMINECTOMY/DECOMPRESSION MICRODISCECTOMY  09/10/2011   Procedure: LUMBAR LAMINECTOMY/DECOMPRESSION MICRODISCECTOMY 1 LEVEL;  Surgeon: Hewitt Shorts, MD;  Location: [facility];  Service: Neurosurgery;  Laterality: Right;  RIGHT Lumbar  Laminotomy and microdiskectomy Lumbar Four-Five   LUMBAR LAMINECTOMY/DECOMPRESSION MICRODISCECTOMY N/A 06/26/2015   Procedure: LUMBAR LAMINECTOMY/DECOMPRESSION MICRODISCECTOMY 1 LEVEL;  Surgeon: Shirlean Kelly, MD;  Location: [facility];  Service: Neurosurgery;  Laterality: N/A;  L3 and L4 Laminectomies   MELANOMA EXCISION  2011   on nose/back on neck   THORACIC DISC SURGERY  at age 12 and 46    mass of veins that had gotten into muscle and wrapped around rib;3 ribs also removed from left side   TONSILLECTOMY  as a child   and adenoids    TOTAL HIP ARTHROPLASTY Left 2014   Dr. Martha Clan   TUBAL LIGATION     VAGINA SURGERY     vaginal wall ruptured     Family History  Problem Relation Age of Onset   COPD Mother        Emphysema   Diabetes  Mother    Other Mother        Died in house fire at age 66.   Heart disease Father    Seizures Father    Cancer Father        Lung cancer with brain metastasis   Cancer Brother    Heart disease Brother        valve replacement   COPD Brother    Asthma Brother    COPD Son 79   Diabetes Maternal Grandmother    Cancer Other        aunt ? maternal vs paternal side lung cancer    Anesthesia problems Neg Hx    Hypotension Neg Hx    Malignant hyperthermia Neg Hx    Pseudochol deficiency Neg Hx    Thyroid disease Neg Hx    Breast cancer Neg Hx     Social History   Socioeconomic History   Marital status: Widowed    Spouse name: Not on file   Number of children: 2   Years of education: 44   Highest education level: 12th grade  Occupational History   Occupation: Retired  Tobacco Use   Smoking status: Former    Current packs/day: 0.00    Average packs/day: 0.3 packs/day for 20.0 years (5.0 ttl pk-yrs)    Types: Cigarettes    Start date: 32    Quit date: 2018    Years since quitting: 6.5   Smokeless tobacco: Never   Tobacco comments:    Pt quit 11/08/2014.Pt had quit smoking for 13 years before starting back in 2010; per 05/2017 visit quit smoking 2017 smoked total 25 years 1 ppd   Vaping Use   Vaping status: Never Used  Substance and Sexual Activity   Alcohol use: No    Alcohol/week: 0.0 standard drinks of alcohol   Drug use: No   Sexual activity: Never    Birth control/protection: Surgical  Other Topics Concern   Not on file  Social History Narrative   Widowed.    Lives alone.   Right-handed.    2 cups caffeine per day.   Social Determinants of Health   Financial Resource Strain: Low Risk  (09/24/2022)   Overall Financial Resource Strain (CARDIA)    Difficulty of Paying Living Expenses: Not hard at all  Food Insecurity: No Food Insecurity (09/24/2022)   Hunger Vital Sign    Worried About Running Out of Food in the Last Year: Never true    Ran Out of Food in the  Last Year: Never true  Transportation Needs: No Transportation Needs (09/24/2022)   PRAPARE - Administrator, Civil Service (Medical): No    Lack of Transportation (Non-Medical): No  Physical Activity: Inactive (09/24/2022)   Exercise Vital Sign    Days of Exercise  per Week: 0 days    Minutes of Exercise per Session: 0 min  Stress: No Stress Concern Present (09/24/2022)   [employer] of Occupational Health - Occupational Stress Questionnaire    Feeling of Stress : Only a little  Social Connections: Unknown (09/24/2022)   Social Connection and Isolation Panel [NHANES]    Frequency of Communication with Friends and Family: Three times a week    Frequency of Social Gatherings with Friends and Family: Once a week    Attends Religious Services: Patient declined    Database administrator or Organizations: No    Attends Banker Meetings: Never    Marital Status: Widowed  Intimate Partner Violence: Not At Risk (09/14/2022)   Humiliation, Afraid, Rape, and Kick questionnaire    Fear of Current or Ex-Partner: No    Emotionally Abused: No    Physically Abused: No    Sexually Abused: No     Outpatient Medications Prior to Visit  Medication Sig Dispense Refill   albuterol (VENTOLIN HFA) 108 (90 Base) MCG/ACT inhaler Inhale 2 puffs into the lungs every 6 (six) hours as needed for wheezing. 6.7 g 11   aspirin EC 81 MG tablet Take 81 mg by mouth daily.     atorvastatin (LIPITOR) 40 MG tablet Take 1 tablet (40 mg total) by mouth daily. 90 tablet 3   BD PEN NEEDLE NANO 2ND GEN 32G X 4 MM MISC USE DAILY AS DIRECTED WITH LEVEMIR 100 each 1   glucose blood (ONETOUCH VERIO) test strip Use as instructed to check blood sugars 3 times daily 100 strip 12   losartan (COZAAR) 100 MG tablet Take 1 tablet (100 mg total) by mouth daily. 90 tablet 1   meloxicam (MOBIC) 7.5 MG tablet TAKE 1 TABLET BY MOUTH EVERY DAY 30 tablet 2   omeprazole (PRILOSEC) 40 MG capsule Take 1 capsule (40 mg  total) by mouth daily. 90 capsule 2   OneTouch Delica Lancets 30G MISC 1 application by Does not apply route 2 (two) times daily. Use to check blood sugar up to two times daily. E11.69 100 each 11   Semaglutide,0.25 or 0.5MG /DOS, (OZEMPIC, 0.25 OR 0.5 MG/DOSE,) 2 MG/3ML SOPN Inject 0.5 mg into the skin once a week. 3 mL 2   traMADol (ULTRAM) 50 MG tablet TAKE 1 TABLET BY MOUTH EVERY 6 (SIX) HOURS AS NEEDED FOR MUSCULOSKELETAL PAIN 90 tablet 2   vitamin B-12 (CYANOCOBALAMIN) 500 MCG tablet Take 500 mcg by mouth daily.     atenolol (TENORMIN) 50 MG tablet Take 1 tablet (50 mg total) by mouth 2 (two) times daily. 180 tablet 1   gabapentin (NEURONTIN) 300 MG capsule Take 1 capsule (300 mg total) by mouth 4 (four) times daily. 120 capsule 1   insulin aspart (FIASP FLEXTOUCH) 100 UNIT/ML FlexTouch Pen Use 5 units with supper  for BS >140, 10 if eating pasta 15 mL 1   insulin degludec (TRESIBA FLEXTOUCH) 100 UNIT/ML FlexTouch Pen Inject 14 Units into the skin daily. (Patient not taking: Reported on 01/29/2023) 15 mL 1   levothyroxine (SYNTHROID) 100 MCG tablet Take 1 tablet (100 mcg total) by mouth daily. 90 tablet 3   metFORMIN (GLUCOPHAGE) 1000 MG tablet TAKE 1 TABLET (1,000 MG TOTAL) BY MOUTH TWICE A DAY WITH FOOD 180 tablet 1   temazepam (RESTORIL) 15 MG capsule TAKE 1 CAPSULE BY MOUTH AT BEDTIME AS NEEDED FOR SLEEP 30 capsule 5   Facility-Administered Medications Prior to Visit  Medication Dose Route  Frequency Provider Last Rate Last Admin   ipratropium (ATROVENT) nebulizer solution 0.5 mg  0.5 mg Nebulization Once Kara Dies, NP        Allergies  Allergen Reactions   Vicodin [Hydrocodone-Acetaminophen] Other (See Comments)    Insomnia    Acetaminophen     Other reaction(s): Insomnia   Vicodin Hp [Hydrocodone-Acetaminophen]     ROS Review of Systems Negative unless indicated in HPI.    Objective:    Physical Exam Constitutional:      Appearance: Normal appearance.  Eyes:      Conjunctiva/sclera: Conjunctivae normal.     Pupils: Pupils are equal, round, and reactive to light.  Cardiovascular:     Rate and Rhythm: Normal rate and regular rhythm.     Pulses: Normal pulses.     Heart sounds: Normal heart sounds.  Pulmonary:     Effort: Pulmonary effort is normal.     Breath sounds: Normal breath sounds.  Abdominal:     General: Bowel sounds are normal.     Palpations: Abdomen is soft.  Musculoskeletal:     Cervical back: Normal range of motion. Rigidity and tenderness present.  Skin:    General: Skin is warm.     Findings: No bruising.  Neurological:     General: No focal deficit present.     Mental Status: She is alert and oriented to person, place, and time. Mental status is at baseline.  Psychiatric:        Mood and Affect: Mood normal.        Behavior: Behavior normal.        Thought Content: Thought content normal.        Judgment: Judgment normal.     There were no vitals taken for this visit. Wt Readings from Last 3 Encounters:  01/29/23 148 lb 9.6 oz (67.4 kg)  12/14/22 153 lb 6.4 oz (69.6 kg)  10/12/22 151 lb 6.4 oz (68.7 kg)     Health Maintenance  Topic Date Due   Zoster Vaccines- Shingrix (1 of 2) Never done   COVID-19 Vaccine (5 - 2023-24 season) 03/06/2022   FOOT EXAM  11/18/2022   Colonoscopy  05/11/2023   INFLUENZA VACCINE  02/04/2023   OPHTHALMOLOGY EXAM  02/12/2023   Diabetic kidney evaluation - Urine ACR  03/21/2023   HEMOGLOBIN A1C  06/15/2023   Medicare Annual Wellness (AWV)  09/14/2023   MAMMOGRAM  12/07/2023   Diabetic kidney evaluation - eGFR measurement  01/29/2024   DTaP/Tdap/Td (2 - Td or Tdap) 02/10/2024   Pneumonia Vaccine 86+ Years old  Completed   DEXA SCAN  Completed   Hepatitis C Screening  Completed   HPV VACCINES  Aged Out    There are no preventive care reminders to display for this patient.  Lab Results  Component Value Date   TSH 3.49 12/14/2022   Lab Results  Component Value Date   WBC 10.6  (H) 09/04/2022   HGB 12.1 09/04/2022   HCT 36.7 09/04/2022   MCV 89.6 09/04/2022   PLT 365.0 09/04/2022   Lab Results  Component Value Date   NA 140 01/29/2023   K 4.6 01/29/2023   CO2 28 01/29/2023   GLUCOSE 103 (H) 01/29/2023   BUN 17 01/29/2023   CREATININE 1.30 (H) 01/29/2023   BILITOT 0.4 12/14/2022   ALKPHOS 98 12/14/2022   AST 18 12/14/2022   ALT 12 12/14/2022   PROT 6.9 12/14/2022   ALBUMIN 4.0 12/14/2022   CALCIUM 8.5 01/29/2023  ANIONGAP 9 02/17/2018   EGFR 63 03/20/2022   GFR 40.38 (L) 01/29/2023   Lab Results  Component Value Date   CHOL 179 12/14/2022   Lab Results  Component Value Date   HDL 62.90 12/14/2022   Lab Results  Component Value Date   LDLCALC 76 12/14/2022   Lab Results  Component Value Date   TRIG 199.0 (H) 12/14/2022   Lab Results  Component Value Date   CHOLHDL 3 12/14/2022   Lab Results  Component Value Date   HGBA1C 7.0 (H) 12/14/2022      Assessment & Plan:  Neck pain Assessment & Plan: Advised patient to continue Flexeril and diclofenac. Advised to use warm compress. If symptoms not improving or have developed new symptoms call the office for further evaluation.     Follow-up: No follow-ups on file.   Kara Dies, NP

## 2023-01-25 ENCOUNTER — Encounter: Payer: Self-pay | Admitting: Pharmacist

## 2023-01-25 ENCOUNTER — Other Ambulatory Visit: Payer: Medicare HMO | Admitting: Pharmacist

## 2023-01-25 ENCOUNTER — Telehealth: Payer: Self-pay | Admitting: Pharmacist

## 2023-01-25 NOTE — Patient Instructions (Signed)
Kimeka,   Stop Levemir, stop Fiasp. Do not start [place].   Continue Ozempic 0.5 mg weekly and metformin 1000 mg twice daily.   Check your blood pressure once daily, and any time you have concerning symptoms like headache, chest pain, dizziness, shortness of breath, or vision changes.   Our goal is less than 130/80.  To appropriately check your blood pressure, make sure you do the following:  1) Avoid caffeine, exercise, or tobacco products for 30 minutes before checking. Empty your bladder. 2) Sit with your back supported in a flat-backed chair. Rest your arm on something flat (arm of the chair, table, etc). 3) Sit still with your feet flat on the floor, resting, for at least 5 minutes.  4) Check your blood pressure. Take 1-2 readings.  5) Write down these readings and bring with you to any provider appointments.  Bring your home blood pressure machine with you to a provider's office for accuracy comparison at least once a year.   Make sure you take your blood pressure medications before you come to any office visit, even if you were asked to fast for labs.   Take care!  [name], PharmD, BCACP, CPP Clinical Pharmacist [facility] [phone]

## 2023-01-25 NOTE — Progress Notes (Signed)
01/25/2023 Name: Maria Hinton MRN: [id number] DOB: [birth year]  Chief Complaint  Patient presents with   Medication Management   Diabetes   Hypertension   Hyperlipidemia    Maria Hinton is a 77 y.o. year old female who presented for a telephone visit.   They were referred to the pharmacist by their PCP for assistance in managing diabetes, hypertension, and hyperlipidemia.    Subjective:  Care Team: Primary Care Provider: Sherlene Shams, MD ; Next Scheduled Visit: 03/2023  Medication Access/Adherence  Current Pharmacy:  [facility] [address], [place] - [address] [place] [address] Phone: [phone] Fax: [phone]  [id number] - [place] [place] - [address] [place] [address] Phone: [phone] Fax: [phone]   Patient reports affordability concerns with their medications: No  Patient reports access/transportation concerns to their pharmacy: No  Patient reports adherence concerns with their medications:  No     Diabetes:  Current medications: Levemir 14 units daily; Ozempic 0.5 mg weekly, metformin 1000 mg twice daily  Date of Download: 7/8-7/21/24 % Time CGM is active: 76% Average Glucose: 107 mg/dL Glucose Management Indicator: 5.9%  Glucose Variability: 28.1 (goal <36%) Time in Goal:  - Time in range 70-180: 92% - Time above range: 3% - Time below range: 5% Observed patterns:   Patient reports hypoglycemic s/sx including dizziness, shakiness, sweating. See separate phone note for episode of falls.   Hypertension:  Current medications: losartan 50 mg daily, atenolol 50 mg twice daily   Patient does not have a validated, automated, upper arm home BP cuff, notes she let her son borrow it.   Hyperlipidemia/ASCVD Risk Reduction  Current lipid lowering medications: atorvastatin 40 mg daily   Objective:  Lab Results  Component Value Date   HGBA1C 7.0 (H) 12/14/2022    Lab Results   Component Value Date   CREATININE 1.21 (H) 12/14/2022   BUN 18 12/14/2022   NA 141 12/14/2022   K 4.8 12/14/2022   CL 101 12/14/2022   CO2 29 12/14/2022    Lab Results  Component Value Date   CHOL 179 12/14/2022   HDL 62.90 12/14/2022   LDLCALC 76 12/14/2022   LDLDIRECT 95.0 12/14/2022   TRIG 199.0 (H) 12/14/2022   CHOLHDL 3 12/14/2022    Medications Reviewed Today     Reviewed by Sandy Salaam, CMA (Certified Medical Assistant) on 12/14/22 at 1347  Med List Status: <None>   Medication Order Taking? Sig Documenting Provider Last Dose Status Informant  albuterol (VENTOLIN HFA) 108 (90 Base) MCG/ACT inhaler [id number] Yes Inhale 2 puffs into the lungs every 6 (six) hours as needed for wheezing. Sherlene Shams, MD Taking Active   aspirin EC 81 MG tablet [id number] Yes Take 81 mg by mouth daily. [provider] Taking Active Self  atenolol (TENORMIN) 50 MG tablet [id number] Yes Take 1 tablet (50 mg total) by mouth 2 (two) times daily. Sherlene Shams, MD Taking Active   atorvastatin (LIPITOR) 40 MG tablet [id number] Yes Take 1 tablet (40 mg total) by mouth daily. Sherlene Shams, MD Taking Active   BD PEN NEEDLE NANO 2ND GEN 32G X 4 MM MISC [id number] Yes USE DAILY AS DIRECTED WITH LEVEMIR Sherlene Shams, MD Taking Active   gabapentin (NEURONTIN) 300 MG capsule [id number] Yes Take 1 capsule (300 mg total) by mouth 4 (four) times daily. Sherlene Shams, MD Taking Active   glucose blood (ONETOUCH VERIO)  test strip [id number] Yes Use as instructed to check blood sugars 3 times daily Sherlene Shams, MD Taking Active   insulin aspart (FIASP FLEXTOUCH) 100 UNIT/ML FlexTouch Pen [id number] No Use 5 units with supper as needed  Patient not taking: Reported on 11/16/2022   Sherlene Shams, MD Not Taking Active   insulin degludec (TRESIBA FLEXTOUCH) 100 UNIT/ML FlexTouch Pen [id number] Yes Inject 14 Units into the skin daily. Sherlene Shams, MD Taking Active   ipratropium (ATROVENT)  nebulizer solution 0.5 mg [id number]   Kara Dies, NP  Active   levothyroxine (SYNTHROID) 100 MCG tablet [id number] Yes Take 1 tablet (100 mcg total) by mouth daily. Sherlene Shams, MD Taking Active   losartan (COZAAR) 50 MG tablet [id number] Yes TAKE 1 TABLET BY MOUTH EVERY DAY Sherlene Shams, MD Taking Active   meloxicam (MOBIC) 7.5 MG tablet [id number] Yes TAKE 1 TABLET BY MOUTH EVERY DAY Worthy Rancher B, FNP Taking Active   metFORMIN (GLUCOPHAGE) 1000 MG tablet [id number] Yes TAKE 1 TABLET (1,000 MG TOTAL) BY MOUTH TWICE A DAY WITH FOOD Sherlene Shams, MD Taking Active   omeprazole (PRILOSEC) 20 MG capsule [id number] Yes Take 20 mg by mouth daily. [provider] Taking Active   OneTouch Delica Lancets 30G MISC [id number] Yes 1 application by Does not apply route 2 (two) times daily. Use to check blood sugar up to two times daily. E11.69 Sherlene Shams, MD Taking Active   Semaglutide,0.25 or 0.5MG /DOS, (OZEMPIC, 0.25 OR 0.5 MG/DOSE,) 2 MG/3ML SOPN [id number] Yes Inject 0.5 mg into the skin once a week. Sherlene Shams, MD Taking Active   temazepam (RESTORIL) 15 MG capsule [id number] Yes TAKE 1 CAPSULE BY MOUTH AT BEDTIME AS NEEDED FOR SLEEP Sherlene Shams, MD Taking Active   traMADol (ULTRAM) 50 MG tablet [id number] Yes TAKE 1 TABLET BY MOUTH EVERY 6 (SIX) HOURS AS NEEDED FOR MUSCULOSKELETAL PAIN Sherlene Shams, MD Taking Active   vitamin B-12 (CYANOCOBALAMIN) 500 MCG tablet [id number] Yes Take 500 mcg by mouth daily. [provider] Taking Active               Assessment/Plan:   Diabetes: - Currently controlled but with significant hypoglycemic episodes.  - Recommend to stop Fiasp PRN, stop Levemir. Do not start [place]. Continue Ozempic 0.5 mg weekly and metformin 1000 mg twice daily.  - Recommend to check glucose continuously using CGM  Hypertension: - Currently uncontrolled - Reviewed long term cardiovascular and renal outcomes of uncontrolled blood  pressure - Reviewed appropriate blood pressure monitoring technique and reviewed goal blood pressure. Recommended to check home blood pressure and heart rate daily and send me home readings in 1 week - Recommend to continue current regimen  Hyperlipidemia/ASCVD Risk Reduction: - Currently controlled.  - Recommend to continue current regimen  Follow Up Plan: will correspond via MyChart in 1 week regarding blood pressures  [name], PharmD, BCACP, CPP Clinical Pharmacist [facility] [phone]

## 2023-01-25 NOTE — Progress Notes (Unsigned)
Reports she fell out of bed early AM Sunday, 7/14. Slid out of bed several times, reports a total of 5 falls Scraped back, several toes, reports she did hit her head. When she stood up, she couldn't stand up straight, head and body kept leaning to the left. Denies change in speech/slurred speech or facial droop.   Reports she did have sharp pains in her head, but notes this has been going on for several years. Reports this was previously evaluated by Dr. Darrick Huntsman. Reports that knee is sore, scabs, R knee/R elbow and top of R arm have scabs. Had knot on back of head where she hit the wall in the bedroom.   Prior ED visit in 2019 for episode of slurred speech related to hypoglycemia. Below are her readings from that evening (Saturday into Sunday)    Reports that she took her normal medication regimen before bed - gabapentin 600 mg (2 300 mg capsules), temazepam 15 mg, tramadol 50 mg.   In today's visit, we have stopped Levemir (she had not transitioned to [place] yet) and PRN Fiasp.   Routing to PCP for notification

## 2023-01-25 NOTE — Telephone Encounter (Signed)
Pt is aware and of medication changes and scheduled a follow up appt with Dr. Darrick Huntsman for Friday.

## 2023-01-29 ENCOUNTER — Ambulatory Visit: Payer: Medicare HMO | Admitting: Internal Medicine

## 2023-01-29 ENCOUNTER — Encounter: Payer: Self-pay | Admitting: Internal Medicine

## 2023-01-29 VITALS — BP 128/68 | HR 70 | Temp 98.3°F | Ht 64.0 in | Wt 148.6 lb

## 2023-01-29 DIAGNOSIS — Z7985 Long-term (current) use of injectable non-insulin antidiabetic drugs: Secondary | ICD-10-CM

## 2023-01-29 DIAGNOSIS — R748 Abnormal levels of other serum enzymes: Secondary | ICD-10-CM

## 2023-01-29 DIAGNOSIS — N1832 Chronic kidney disease, stage 3b: Secondary | ICD-10-CM

## 2023-01-29 DIAGNOSIS — E1140 Type 2 diabetes mellitus with diabetic neuropathy, unspecified: Secondary | ICD-10-CM | POA: Diagnosis not present

## 2023-01-29 DIAGNOSIS — E785 Hyperlipidemia, unspecified: Secondary | ICD-10-CM

## 2023-01-29 DIAGNOSIS — I10 Essential (primary) hypertension: Secondary | ICD-10-CM

## 2023-01-29 DIAGNOSIS — K224 Dyskinesia of esophagus: Secondary | ICD-10-CM | POA: Diagnosis not present

## 2023-01-29 DIAGNOSIS — E1169 Type 2 diabetes mellitus with other specified complication: Secondary | ICD-10-CM

## 2023-01-29 DIAGNOSIS — Z794 Long term (current) use of insulin: Secondary | ICD-10-CM

## 2023-01-29 DIAGNOSIS — N183 Chronic kidney disease, stage 3 unspecified: Secondary | ICD-10-CM | POA: Insufficient documentation

## 2023-01-29 LAB — BASIC METABOLIC PANEL
BUN: 17 mg/dL (ref 6–23)
CO2: 28 mEq/L (ref 19–32)
Calcium: 8.5 mg/dL (ref 8.4–10.5)
Chloride: 103 mEq/L (ref 96–112)
Creatinine, Ser: 1.3 mg/dL — ABNORMAL HIGH (ref 0.40–1.20)
GFR: 40.38 mL/min — ABNORMAL LOW (ref >60.00)
Glucose, Bld: 103 mg/dL — ABNORMAL HIGH (ref 70–99)
Potassium: 4.6 mEq/L (ref 3.5–5.1)
Sodium: 140 mEq/L (ref 135–145)

## 2023-01-29 MED ORDER — LEVOTHYROXINE SODIUM 100 MCG PO TABS: 100.0000 ug | ORAL_TABLET | Freq: Every day | ORAL | 3 refills | Status: DC

## 2023-01-29 MED ORDER — GABAPENTIN 100 MG PO CAPS
100.0000 mg | ORAL_CAPSULE | Freq: Three times a day (TID) | ORAL | 3 refills | Status: DC
Start: 2023-01-29 — End: 2023-10-11

## 2023-01-29 MED ORDER — TEMAZEPAM 7.5 MG PO CAPS: 7.5000 mg | ORAL_CAPSULE | Freq: Every evening | ORAL | 0 refills | Status: DC | PRN

## 2023-01-29 MED ORDER — ATENOLOL 50 MG PO TABS: 50.0000 mg | ORAL_TABLET | Freq: Two times a day (BID) | ORAL | 1 refills | Status: DC

## 2023-01-29 MED ORDER — GABAPENTIN 300 MG PO CAPS: 300.0000 mg | ORAL_CAPSULE | Freq: Four times a day (QID) | ORAL | 1 refills | Status: DC

## 2023-01-29 MED ORDER — METFORMIN HCL 1000 MG PO TABS: ORAL_TABLET | ORAL | 1 refills | Status: DC

## 2023-01-29 NOTE — Assessment & Plan Note (Signed)
Resulting in dysphagia for solids and liquids,  confirmed with barium swallow July 2024. No HH or GERD .  Slight delay in transit of barium tablet. Referring to GI for EGD

## 2023-01-29 NOTE — Assessment & Plan Note (Signed)
Recurrent intermittent elevations.  U/S of liver in 2019 was normal.  Occurs when thyroid is hyperactive .  [place] LFTs are normal   Lab Results  Component Value Date   ALT 12 12/14/2022   AST 18 12/14/2022   ALKPHOS 98 12/14/2022   BILITOT 0.4 12/14/2022

## 2023-01-29 NOTE — Assessment & Plan Note (Signed)
Tolerating lipitor .  Direct LDL is 74.Marland Kitchen  No changes   Lab Results  Component Value Date   CHOL 179 12/14/2022   HDL 62.90 12/14/2022   LDLCALC 76 12/14/2022   LDLDIRECT 95.0 12/14/2022   TRIG 199.0 (H) 12/14/2022   CHOLHDL 3 12/14/2022

## 2023-01-29 NOTE — Assessment & Plan Note (Addendum)
At last visit with me  in June, she was advised to reduce Levemir to 14 units and add aspart 5 units at dinner time .  However, all basal insulin was stopped by Catie  at her visit 2 weeks ago due to recurrent lows into the 50's (resulting in a nocturnal fall)/nd she is only taking mealtime insulin if she has a starch at meal.  She took no insulin yesterday    She had a low BS of 76  this morning (fasting)  .  Advised to add a bedtime snack at night continue ozempic and metformin to normalize weight.  Reduction of gabapepentin dose from 600 mg to 300 mg has been accompanied by increased discomfort at night.  Increased dose to 400 mg at night.   Lab Results  Component Value Date   HGBA1C 7.0 (H) 12/14/2022   Lab Results  Component Value Date   LABMICR 25.6 02/14/2021   MICROALBUR 4.0 03/20/2022   MICROALBUR 1.8 02/20/2020

## 2023-01-29 NOTE — Assessment & Plan Note (Signed)
Contine Ozempic at current dose

## 2023-01-29 NOTE — Assessment & Plan Note (Signed)
she reports compliance with medication regimen  and is not at goal on 100 mg losartan

## 2023-01-29 NOTE — Progress Notes (Addendum)
Subjective:  Patient ID: Maria Hinton, female    DOB: [birth year]  Age: 77 y.o. MRN: [id number]  CC: The primary encounter diagnosis was Esophageal dysmotility. Diagnoses of HTN (hypertension), benign, Type 2 diabetes mellitus with diabetic neuropathy, with long-term current use of insulin (HCC), Stage 3b chronic kidney disease (HCC), Elevated liver enzymes, Hyperlipidemia associated with type 2 diabetes mellitus (HCC), and Long-term current use of injectable noninsulin antidiabetic medication were also pertinent to this visit.   HPI Maria Hinton presents for  Chief Complaint  Patient presents with  . Medical Management of Chronic Issues   1) Recent fall reported by  Caroline More, PHD during recent follow up . The fall  Occurred on July 13,  after waking up in the middle of the night with an auditory hallucination.  Fell 5-6  times on the way to the bathroom .  Felt she was leaning to the left and couldn't maintain upright position.    Scraped right knee  right elbow .  Had been taking 600 mg gabapentin for neuroapthy  at and tramadol for lumbar spinal stenosis at 8 pm  for neuropapthy  along with temazepam at 10 pm for  insomnia.  In response to the falls , her gabapentin dose was reduced to 300 mg and her neuropathy pain has returned.    2) T2DM:  no long.I have downloaded and reviewed the data from patient's continuous blood glucose monitor. Patient's  sugars have been  IN RANGE 89    % OF THE TIME,   BELOW RANGE  5 % of the time.  And ABOVE RANGE  6 % OF THE TIME .  Medication changes were made based on this review as follows:   she has stopped basal  insulin (Levemir ) due to recurrent lows in the 50's on July 13 Since then the lowest reading has been  54 on July  20   73 on July 21 at 9:40 fasting and 76 this morning ; she is not using mealtime insulin either   Outpatient Medications Prior to Visit  Medication Sig Dispense Refill  . albuterol (VENTOLIN HFA) 108 (90 Base) MCG/ACT inhaler  Inhale 2 puffs into the lungs every 6 (six) hours as needed for wheezing. 6.7 g 11  . aspirin EC 81 MG tablet Take 81 mg by mouth daily.    Marland Kitchen atorvastatin (LIPITOR) 40 MG tablet Take 1 tablet (40 mg total) by mouth daily. 90 tablet 3  . BD PEN NEEDLE NANO 2ND GEN 32G X 4 MM MISC USE DAILY AS DIRECTED WITH LEVEMIR 100 each 1  . glucose blood (ONETOUCH VERIO) test strip Use as instructed to check blood sugars 3 times daily 100 strip 12  . losartan (COZAAR) 100 MG tablet Take 1 tablet (100 mg total) by mouth daily. 90 tablet 1  . meloxicam (MOBIC) 7.5 MG tablet TAKE 1 TABLET BY MOUTH EVERY DAY 30 tablet 2  . omeprazole (PRILOSEC) 40 MG capsule Take 1 capsule (40 mg total) by mouth daily. 90 capsule 2  . OneTouch Delica Lancets 30G MISC 1 application by Does not apply route 2 (two) times daily. Use to check blood sugar up to two times daily. E11.69 100 each 11  . Semaglutide,0.25 or 0.5MG /DOS, (OZEMPIC, 0.25 OR 0.5 MG/DOSE,) 2 MG/3ML SOPN Inject 0.5 mg into the skin once a week. 3 mL 2  . traMADol (ULTRAM) 50 MG tablet TAKE 1 TABLET BY MOUTH EVERY 6 (SIX) HOURS AS NEEDED FOR MUSCULOSKELETAL PAIN  90 tablet 2  . vitamin B-12 (CYANOCOBALAMIN) 500 MCG tablet Take 500 mcg by mouth daily.    Marland Kitchen atenolol (TENORMIN) 50 MG tablet Take 1 tablet (50 mg total) by mouth 2 (two) times daily. 180 tablet 1  . gabapentin (NEURONTIN) 300 MG capsule Take 1 capsule (300 mg total) by mouth 4 (four) times daily. 120 capsule 1  . levothyroxine (SYNTHROID) 100 MCG tablet Take 1 tablet (100 mcg total) by mouth daily. 90 tablet 3  . metFORMIN (GLUCOPHAGE) 1000 MG tablet TAKE 1 TABLET (1,000 MG TOTAL) BY MOUTH TWICE A DAY WITH FOOD 180 tablet 1  . temazepam (RESTORIL) 15 MG capsule TAKE 1 CAPSULE BY MOUTH AT BEDTIME AS NEEDED FOR SLEEP 30 capsule 5  . insulin degludec (TRESIBA FLEXTOUCH) 100 UNIT/ML FlexTouch Pen Inject 14 Units into the skin daily. (Patient not taking: Reported on 01/29/2023) 15 mL 1   Facility-Administered  Medications Prior to Visit  Medication Dose Route Frequency Provider Last Rate Last Admin  . ipratropium (ATROVENT) nebulizer solution 0.5 mg  0.5 mg Nebulization Once Kara Dies, NP        Review of Systems;  Patient denies headache, fevers, malaise, unintentional weight loss, skin rash, eye pain, sinus congestion and sinus pain, sore throat, dysphagia,  hemoptysis , cough, dyspnea, wheezing, chest pain, palpitations, orthopnea, edema, abdominal pain, nausea, melena, diarrhea, constipation, flank pain, dysuria, hematuria, urinary  Frequency, nocturia, numbness, tingling, seizures,  Focal weakness, Loss of consciousness,  Tremor, insomnia, depression, anxiety, and suicidal ideation.      Objective:  BP 128/68   Pulse 70   Temp 98.3 F (36.8 C) (Oral)   Ht 5\' 4"  (1.626 m)   Wt 148 lb 9.6 oz (67.4 kg)   SpO2 96%   BMI 25.51 kg/m   BP Readings from Last 3 Encounters:  01/29/23 128/68  12/14/22 (!) 168/86  10/12/22 (!) 150/88    Wt Readings from Last 3 Encounters:  01/29/23 148 lb 9.6 oz (67.4 kg)  12/14/22 153 lb 6.4 oz (69.6 kg)  10/12/22 151 lb 6.4 oz (68.7 kg)    Physical Exam Vitals reviewed.  Constitutional:      General: She is not in acute distress.    Appearance: Normal appearance. She is normal weight. She is not ill-appearing, toxic-appearing or diaphoretic.  HENT:     Head: Normocephalic.  Eyes:     General: No scleral icterus.       Right eye: No discharge.        Left eye: No discharge.     Conjunctiva/sclera: Conjunctivae normal.  Cardiovascular:     Rate and Rhythm: Normal rate and regular rhythm.     Heart sounds: Normal heart sounds.  Pulmonary:     Effort: Pulmonary effort is normal. No respiratory distress.     Breath sounds: Normal breath sounds.  Musculoskeletal:        General: Normal range of motion.  Skin:    General: Skin is warm and dry.  Neurological:     General: No focal deficit present.     Mental Status: She is alert and  oriented to person, place, and time. Mental status is at baseline.  Psychiatric:        Mood and Affect: Mood normal.        Behavior: Behavior normal.        Thought Content: Thought content normal.        Judgment: Judgment normal.   Lab Results  Component Value Date  HGBA1C 7.0 (H) 12/14/2022   HGBA1C 8.0 (H) 09/04/2022   HGBA1C 6.1 (H) 03/20/2022    Lab Results  Component Value Date   CREATININE 1.30 (H) 01/29/2023   CREATININE 1.21 (H) 12/14/2022   CREATININE 0.83 09/04/2022    Lab Results  Component Value Date   WBC 10.6 (H) 09/04/2022   HGB 12.1 09/04/2022   HCT 36.7 09/04/2022   PLT 365.0 09/04/2022   GLUCOSE 103 (H) 01/29/2023   CHOL 179 12/14/2022   TRIG 199.0 (H) 12/14/2022   HDL 62.90 12/14/2022   LDLDIRECT 95.0 12/14/2022   LDLCALC 76 12/14/2022   ALT 12 12/14/2022   AST 18 12/14/2022   NA 140 01/29/2023   K 4.6 01/29/2023   CL 103 01/29/2023   CREATININE 1.30 (H) 01/29/2023   BUN 17 01/29/2023   CO2 28 01/29/2023   TSH 3.49 12/14/2022   INR 0.88 02/17/2018   HGBA1C 7.0 (H) 12/14/2022   MICROALBUR 4.0 03/20/2022    DG ESOPHAGUS W SINGLE CM (SOL OR THIN BA)  Result Date: 01/11/2023 CLINICAL DATA:  Dysphagia with solids and liquids. Patient reports frequent regurgitation. EXAM: ESOPHAGUS/BARIUM SWALLOW/TABLET STUDY TECHNIQUE: Single contrast examination was performed using thin liquid barium. This exam was performed by Mina Marble, PA-C , and was supervised and interpreted by Dr. Duanne Guess. FLUOROSCOPY: Radiation Exposure Index (as provided by the fluoroscopic device): 14.10 mGy Kerma COMPARISON:  None Available. FINDINGS: Swallowing: Trace vestibular penetration without aspiration visualized. Pharynx: Unremarkable. Esophagus: Normal appearance. Esophageal motility: Moderate dysmotility with splitting and stasis of contrast material visualized. Hiatal Hernia: None. Gastroesophageal reflux: None visualized. Ingested 13mm barium tablet: Brief delay of  tablet at gastroesophageal junction before passing. Other: None. IMPRESSION: 1.  Moderate esophageal dysmotility 2.  Trace vestibular penetration Electronically Signed   By: Duanne Guess D.O.   On: 01/11/2023 11:48    Assessment & Plan:  .Esophageal dysmotility Assessment & Plan: Resulting in dysphagia for solids and liquids,  confirmed with barium swallow July 2024. No HH or GERD .  Slight delay in transit of barium tablet. Referring to GI for EGD   Orders: -     Ambulatory referral to Gastroenterology  HTN (hypertension), benign Assessment & Plan: she reports compliance with medication regimen  and is not at goal on 100 mg losartan   Orders: -     Basic metabolic panel  Type 2 diabetes mellitus with diabetic neuropathy, with long-term current use of insulin (HCC) Assessment & Plan: At last visit with me  in June, she was advised to reduce Levemir to 14 units and add aspart 5 units at dinner time .  However, all basal insulin was stopped by Catie  at her visit 2 weeks ago due to recurrent lows into the 50's (resulting in a nocturnal fall)/nd she is only taking mealtime insulin if she has a starch at meal.  She took no insulin yesterday    She had a low BS of 76  this morning (fasting)  .  Advised to add a bedtime snack at night continue ozempic and metformin to normalize weight.  Reduction of gabapepentin dose from 600 mg to 300 mg has been accompanied by increased discomfort at night.  Increased dose to 400 mg at night.   Lab Results  Component Value Date   HGBA1C 7.0 (H) 12/14/2022   Lab Results  Component Value Date   LABMICR 25.6 02/14/2021   MICROALBUR 4.0 03/20/2022   MICROALBUR 1.8 02/20/2020  Stage 3b chronic kidney disease (HCC) Assessment & Plan: GFR has been declining since march.  Referring to Nephrology  Lab Results  Component Value Date   LABMICR 25.6 02/14/2021   MICROALBUR 4.0 03/20/2022   MICROALBUR 1.8 02/20/2020     Lab Results   Component Value Date   CREATININE 1.30 (H) 01/29/2023     Orders: -     Ambulatory referral to Nephrology  Elevated liver enzymes Assessment & Plan: Recurrent intermittent elevations.  U/S of liver in 2019 was normal.  Occurs when thyroid is hyperactive .  Curretly LFTs are normal   Lab Results  Component Value Date   ALT 12 12/14/2022   AST 18 12/14/2022   ALKPHOS 98 12/14/2022   BILITOT 0.4 12/14/2022     Hyperlipidemia associated with type 2 diabetes mellitus (HCC) Assessment & Plan: Tolerating lipitor .  Direct LDL is 74.Marland Kitchen  No changes   Lab Results  Component Value Date   CHOL 179 12/14/2022   HDL 62.90 12/14/2022   LDLCALC 76 12/14/2022   LDLDIRECT 95.0 12/14/2022   TRIG 199.0 (H) 12/14/2022   CHOLHDL 3 12/14/2022      Long-term current use of injectable noninsulin antidiabetic medication Assessment & Plan: Contine Ozempic at current dose    Other orders -     Atenolol; Take 1 tablet (50 mg total) by mouth 2 (two) times daily.  Dispense: 180 tablet; Refill: 1 -     Gabapentin; Take 1 capsule (300 mg total) by mouth 4 (four) times daily.  Dispense: 120 capsule; Refill: 1 -     Levothyroxine Sodium; Take 1 tablet (100 mcg total) by mouth daily.  Dispense: 90 tablet; Refill: 3 -     metFORMIN HCl; TAKE 1 TABLET (1,000 MG TOTAL) BY MOUTH TWICE A DAY WITH FOOD  Dispense: 180 tablet; Refill: 1 -     Temazepam; Take 1 capsule (7.5 mg total) by mouth at bedtime as needed for sleep.  Dispense: 30 capsule; Refill: 0 -     Gabapentin; Take 1 capsule (100 mg total) by mouth 3 (three) times daily.  Dispense: 90 capsule; Refill: 3     I provided 30 minutes of face-to-face time during this encounter reviewing patient's last visit with me, patient's  most recent visit with cardiology,  nephrology,  and neurology,  recent surgical and non surgical procedures, previous  labs and imaging studies, counseling on currently addressed issues,  and post visit ordering to  diagnostics and therapeutics .   Follow-up: Return in about 4 weeks (around 02/26/2023) for follow up diabetes.   Sherlene Shams, MD

## 2023-01-29 NOTE — Assessment & Plan Note (Addendum)
GFR has been declining since march.  Referring to Nephrology  Lab Results  Component Value Date   LABMICR 25.6 02/14/2021   MICROALBUR 4.0 03/20/2022   MICROALBUR 1.8 02/20/2020     Lab Results  Component Value Date   CREATININE 1.30 (H) 01/29/2023

## 2023-01-29 NOTE — Patient Instructions (Addendum)
You hallucinated and fell  either because of low blood sugars,  or because of your medications.  We are making changes as follows;    Reduce the temazepam dose to 7.5 mg instead of 15 mg (new rx sent to pharmacy)  Increase the gabapentin to 400 mg in the evening  using 300 mg capsule  plus the new  100 mg capsule  You can add 100 mg gabapentin in the morning and afternoon if tolerated (and if needed)  continue current use of tramadol   Start eating a bedtime snack of 4 ounces of mild plus 2  peanut butter crackers

## 2023-01-31 DIAGNOSIS — M542 Cervicalgia: Secondary | ICD-10-CM | POA: Insufficient documentation

## 2023-01-31 NOTE — Assessment & Plan Note (Signed)
Advised patient to continue Flexeril and diclofenac. Advised to use warm compress. If symptoms not improving or have developed new symptoms call the office for further evaluation.

## 2023-02-02 ENCOUNTER — Telehealth: Payer: Self-pay | Admitting: Pharmacist

## 2023-02-02 NOTE — Progress Notes (Signed)
Maria Hinton was discontinued. Can we please contact [facility] to ask to take off auto refill?  Thanks!

## 2023-02-04 ENCOUNTER — Other Ambulatory Visit: Payer: Self-pay | Admitting: Pharmacist

## 2023-02-04 NOTE — Progress Notes (Signed)
Care Coordination Call  Patient sent me home blood pressure readings via MyChart.    Systolic Diastolic  135 63  130 70  128 64  113 57  124 65  132 71  116 65  95 66  108 66  126 61  128 66  [phone]   Inquired after any symptoms of hypotension with reading with systolic <100. She noted some dizziness, but she hydrated and rested. Encouraged to let us know if future similar symptoms, and we could consider therapy adjustment.   Continue current regimen at this time.   [name], PharmD, BCACP, CPP Clinical Pharmacist [facility] [phone]

## 2023-02-05 NOTE — Telephone Encounter (Signed)
Cancelled auto refills with [facility] nordisk.

## 2023-02-09 DIAGNOSIS — R8290 Unspecified abnormal findings in urine: Secondary | ICD-10-CM | POA: Insufficient documentation

## 2023-02-09 DIAGNOSIS — R809 Proteinuria, unspecified: Secondary | ICD-10-CM | POA: Insufficient documentation

## 2023-02-11 ENCOUNTER — Other Ambulatory Visit: Payer: Self-pay | Admitting: Nephrology

## 2023-02-11 DIAGNOSIS — R808 Other proteinuria: Secondary | ICD-10-CM

## 2023-02-11 DIAGNOSIS — I10 Essential (primary) hypertension: Secondary | ICD-10-CM

## 2023-02-11 DIAGNOSIS — N183 Chronic kidney disease, stage 3 unspecified: Secondary | ICD-10-CM

## 2023-02-11 DIAGNOSIS — R8290 Unspecified abnormal findings in urine: Secondary | ICD-10-CM

## 2023-02-12 ENCOUNTER — Ambulatory Visit: Payer: Medicare HMO

## 2023-02-12 ENCOUNTER — Telehealth: Payer: Self-pay | Admitting: *Deleted

## 2023-02-16 ENCOUNTER — Ambulatory Visit
Admission: RE | Admit: 2023-02-16 | Discharge: 2023-02-16 | Disposition: A | Discharge status: Home / Self Care | Payer: Medicare HMO | Source: Ambulatory Visit | Attending: Nephrology | Admitting: Nephrology

## 2023-02-16 ENCOUNTER — Telehealth: Payer: Self-pay

## 2023-02-16 DIAGNOSIS — R8290 Unspecified abnormal findings in urine: Secondary | ICD-10-CM | POA: Insufficient documentation

## 2023-02-16 DIAGNOSIS — I10 Essential (primary) hypertension: Secondary | ICD-10-CM | POA: Insufficient documentation

## 2023-02-16 DIAGNOSIS — N183 Chronic kidney disease, stage 3 unspecified: Secondary | ICD-10-CM | POA: Insufficient documentation

## 2023-02-16 DIAGNOSIS — E1122 Type 2 diabetes mellitus with diabetic chronic kidney disease: Secondary | ICD-10-CM | POA: Diagnosis present

## 2023-02-16 DIAGNOSIS — R808 Other proteinuria: Secondary | ICD-10-CM | POA: Diagnosis present

## 2023-02-16 NOTE — Telephone Encounter (Signed)
Pt called back and I read the message to her and she states she only uses the ozempic once a week

## 2023-02-16 NOTE — Telephone Encounter (Signed)
LMTCB. Need to find out if pt is still using Fiasp insulin as needed.

## 2023-02-16 NOTE — Telephone Encounter (Signed)
Spoke with pt to let her know we have received her pt assistance medication and it is ready for pick up. Pt gave a verbal understanding and stated she would pick up tomorrow.   Ozempic: 5 boxes

## 2023-02-16 NOTE — Telephone Encounter (Signed)
Verified with Catie as well.

## 2023-02-17 LAB — HM DIABETES EYE EXAM

## 2023-02-18 ENCOUNTER — Encounter (INDEPENDENT_AMBULATORY_CARE_PROVIDER_SITE_OTHER): Payer: Self-pay

## 2023-02-18 NOTE — Telephone Encounter (Signed)
Pt came and picked up 5 boxes of Ozempic on 02/18/2023.

## 2023-02-20 ENCOUNTER — Other Ambulatory Visit: Payer: Self-pay | Admitting: Internal Medicine

## 2023-02-20 DIAGNOSIS — K2100 Gastro-esophageal reflux disease with esophagitis, without bleeding: Secondary | ICD-10-CM

## 2023-02-22 NOTE — Telephone Encounter (Signed)
Medication was discontinued on 12/14/2022. Is it okay to refuse refill?

## 2023-03-15 ENCOUNTER — Other Ambulatory Visit: Payer: Self-pay | Admitting: Internal Medicine

## 2023-03-16 ENCOUNTER — Encounter: Payer: Self-pay | Admitting: Internal Medicine

## 2023-03-16 ENCOUNTER — Ambulatory Visit: Payer: Medicare HMO | Admitting: Internal Medicine

## 2023-03-16 VITALS — BP 131/78 | HR 83 | Ht 64.0 in | Wt 143.2 lb

## 2023-03-16 DIAGNOSIS — E1140 Type 2 diabetes mellitus with diabetic neuropathy, unspecified: Secondary | ICD-10-CM | POA: Diagnosis not present

## 2023-03-16 DIAGNOSIS — E785 Hyperlipidemia, unspecified: Secondary | ICD-10-CM | POA: Diagnosis not present

## 2023-03-16 DIAGNOSIS — Z794 Long term (current) use of insulin: Secondary | ICD-10-CM

## 2023-03-16 DIAGNOSIS — N1832 Chronic kidney disease, stage 3b: Secondary | ICD-10-CM

## 2023-03-16 DIAGNOSIS — Z23 Encounter for immunization: Secondary | ICD-10-CM

## 2023-03-16 DIAGNOSIS — E1169 Type 2 diabetes mellitus with other specified complication: Secondary | ICD-10-CM | POA: Diagnosis not present

## 2023-03-16 DIAGNOSIS — I10 Essential (primary) hypertension: Secondary | ICD-10-CM | POA: Diagnosis not present

## 2023-03-16 DIAGNOSIS — E039 Hypothyroidism, unspecified: Secondary | ICD-10-CM

## 2023-03-16 DIAGNOSIS — Z1211 Encounter for screening for malignant neoplasm of colon: Secondary | ICD-10-CM

## 2023-03-16 NOTE — Progress Notes (Signed)
Subjective:  Patient ID: Maria Hinton, female    DOB: 1946/06/23  Age: 77 y.o. MRN: [id number]  CC: The primary encounter diagnosis was Colon cancer screening. Diagnoses of HTN (hypertension), benign, Hyperlipidemia associated with type 2 diabetes mellitus (HCC), Type 2 diabetes mellitus with diabetic neuropathy, with long-term current use of insulin (HCC), Encounter for immunization, Stage 3b chronic kidney disease (HCC), and Acquired hypothyroidism were also pertinent to this visit.   HPI REILYNN DEHERRERA presents for  Chief Complaint  Patient presents with   Medical Management of Chronic Issues   1) Type 2 DM:    I have downloaded and reviewed the data from patient's continuous blood glucose monitor. Patient's  sugars have been  IN RANGE  89   % OF THE TIME,   BELOW RANGE6    % of the time.  And ABOVE RANGE 5  % OF THE TIME . She is managing her diabetes with ozempic and metformin. She has been  losing weight on Ozempic 0.5 mg weekly.  Feels boated often.  BMs  alternate between normal and soft/messy but not loose. She has had recurrent lows in the early morning., which have been avoided by eating a bedtime snack.    2) HTN:  home readings    have been in the range of 131/78 ; most recent reading was taken 2 days ago .    Outpatient Medications Prior to Visit  Medication Sig Dispense Refill   albuterol (VENTOLIN HFA) 108 (90 Base) MCG/ACT inhaler Inhale 2 puffs into the lungs every 6 (six) hours as needed for wheezing. 6.7 g 11   aspirin EC 81 MG tablet Take 81 mg by mouth daily.     atenolol (TENORMIN) 50 MG tablet Take 1 tablet (50 mg total) by mouth 2 (two) times daily. 180 tablet 1   atorvastatin (LIPITOR) 40 MG tablet Take 1 tablet (40 mg total) by mouth daily. 90 tablet 3   BD PEN NEEDLE NANO 2ND GEN 32G X 4 MM MISC USE DAILY AS DIRECTED WITH LEVEMIR 100 each 1   gabapentin (NEURONTIN) 100 MG capsule Take 1 capsule (100 mg total) by mouth 3 (three) times daily. 90 capsule 3    gabapentin (NEURONTIN) 300 MG capsule Take 1 capsule (300 mg total) by mouth 4 (four) times daily. 120 capsule 1   glucose blood (ONETOUCH VERIO) test strip Use as instructed to check blood sugars 3 times daily 100 strip 12   levothyroxine (SYNTHROID) 100 MCG tablet Take 1 tablet (100 mcg total) by mouth daily. 90 tablet 3   losartan (COZAAR) 50 MG tablet Take 50 mg by mouth daily.     meloxicam (MOBIC) 7.5 MG tablet TAKE 1 TABLET BY MOUTH EVERY DAY 30 tablet 2   metFORMIN (GLUCOPHAGE) 1000 MG tablet TAKE 1 TABLET (1,000 MG TOTAL) BY MOUTH TWICE A DAY WITH FOOD 180 tablet 1   omeprazole (PRILOSEC) 20 MG capsule Take 20 mg by mouth daily.     OneTouch Delica Lancets 30G MISC 1 application by Does not apply route 2 (two) times daily. Use to check blood sugar up to two times daily. E11.69 100 each 11   Semaglutide,0.25 or 0.5MG /DOS, (OZEMPIC, 0.25 OR 0.5 MG/DOSE,) 2 MG/3ML SOPN Inject 0.5 mg into the skin once a week. 3 mL 2   temazepam (RESTORIL) 7.5 MG capsule TAKE 1 CAPSULE (7.5 MG TOTAL) BY MOUTH EVERY DAY AT BEDTIME AS NEEDED FOR SLEEP 30 capsule 0   traMADol (ULTRAM) 50 MG  tablet TAKE 1 TABLET BY MOUTH EVERY 6 (SIX) HOURS AS NEEDED FOR MUSCULOSKELETAL PAIN 90 tablet 2   vitamin B-12 (CYANOCOBALAMIN) 500 MCG tablet Take 500 mcg by mouth daily.     losartan (COZAAR) 100 MG tablet Take 1 tablet (100 mg total) by mouth daily. (Patient not taking: Reported on 03/16/2023) 90 tablet 1   omeprazole (PRILOSEC) 40 MG capsule Take 1 capsule (40 mg total) by mouth daily. (Patient not taking: Reported on 03/16/2023) 90 capsule 2   Facility-Administered Medications Prior to Visit  Medication Dose Route Frequency Provider Last Rate Last Admin   ipratropium (ATROVENT) nebulizer solution 0.5 mg  0.5 mg Nebulization Once Kara Dies, NP        Review of Systems;  Patient denies headache, fevers, malaise, unintentional weight loss, skin rash, eye pain, sinus congestion and sinus pain, sore throat,  dysphagia,  hemoptysis , cough, dyspnea, wheezing, chest pain, palpitations, orthopnea, edema, abdominal pain, nausea, melena, diarrhea, constipation, flank pain, dysuria, hematuria, urinary  Frequency, nocturia, numbness, tingling, seizures,  Focal weakness, Loss of consciousness,  Tremor, insomnia, depression, anxiety, and suicidal ideation.      Objective:  BP 131/78   Pulse 83   Ht 5\' 4"  (1.626 m)   Wt 143 lb 3.2 oz (65 kg)   SpO2 95%   BMI 24.58 kg/m   BP Readings from Last 3 Encounters:  03/16/23 131/78  01/29/23 128/68  12/14/22 (!) 168/86    Wt Readings from Last 3 Encounters:  03/16/23 143 lb 3.2 oz (65 kg)  01/29/23 148 lb 9.6 oz (67.4 kg)  12/14/22 153 lb 6.4 oz (69.6 kg)    Physical Exam Vitals reviewed.  Constitutional:      General: She is not in acute distress.    Appearance: Normal appearance. She is normal weight. She is not ill-appearing, toxic-appearing or diaphoretic.  HENT:     Head: Normocephalic.  Eyes:     General: No scleral icterus.       Right eye: No discharge.        Left eye: No discharge.     Conjunctiva/sclera: Conjunctivae normal.  Cardiovascular:     Rate and Rhythm: Normal rate and regular rhythm.     Heart sounds: Normal heart sounds.  Pulmonary:     Effort: Pulmonary effort is normal. No respiratory distress.     Breath sounds: Normal breath sounds.  Musculoskeletal:        General: Normal range of motion.  Skin:    General: Skin is warm and dry.  Neurological:     General: No focal deficit present.     Mental Status: She is alert and oriented to person, place, and time. Mental status is at baseline.  Psychiatric:        Mood and Affect: Mood normal.        Behavior: Behavior normal.        Thought Content: Thought content normal.        Judgment: Judgment normal.     Lab Results  Component Value Date   HGBA1C 7.0 (H) 12/14/2022   HGBA1C 8.0 (H) 09/04/2022   HGBA1C 6.1 (H) 03/20/2022    Lab Results  Component Value  Date   CREATININE 1.30 (H) 01/29/2023   CREATININE 1.21 (H) 12/14/2022   CREATININE 0.83 09/04/2022    Lab Results  Component Value Date   WBC 10.6 (H) 09/04/2022   HGB 12.1 09/04/2022   HCT 36.7 09/04/2022   PLT 365.0 09/04/2022  GLUCOSE 103 (H) 01/29/2023   CHOL 179 12/14/2022   TRIG 199.0 (H) 12/14/2022   HDL 62.90 12/14/2022   LDLDIRECT 95.0 12/14/2022   LDLCALC 76 12/14/2022   ALT 12 12/14/2022   AST 18 12/14/2022   NA 140 01/29/2023   K 4.6 01/29/2023   CL 103 01/29/2023   CREATININE 1.30 (H) 01/29/2023   BUN 17 01/29/2023   CO2 28 01/29/2023   TSH 3.49 12/14/2022   INR 0.88 02/17/2018   HGBA1C 7.0 (H) 12/14/2022   MICROALBUR 4.0 03/20/2022    US RENAL  Result Date: 02/16/2023 CLINICAL DATA:  Stage 3 chronic kidney disease.  Hypertension. EXAM: RENAL / URINARY TRACT ULTRASOUND COMPLETE COMPARISON:  Limited abdomen ultrasound Nov 18, 2017 FINDINGS: Right Kidney: Renal measurements: 10.4 x 4.8 x 3.5 cm = volume: 91.4 mL. Echogenicity within normal limits. No mass or hydronephrosis visualized. Left Kidney: Renal measurements: 10 x 4.8 x 4.4 cm = volume: 110.6 mL. Echogenicity within normal limits. No mass or hydronephrosis visualized. Bladder: Appears normal for degree of bladder distention. Other: None. IMPRESSION: Normal renal ultrasound. Electronically Signed   By: Sherian Rein M.D.   On: 02/16/2023 14:15    Assessment & Plan:  .Colon cancer screening -     Ambulatory referral to Gastroenterology  HTN (hypertension), benign Assessment & Plan: she reports compliance with medication regimen  and is  at goal on  50 mg losartan .  Previous recurrent hypotension on 100 mg losartan dose resulted in recurrent falls   Orders: -     Comprehensive metabolic panel -     Microalbumin / creatinine urine ratio; Future  Hyperlipidemia associated with type 2 diabetes mellitus (HCC) -     Lipid panel -     LDL cholesterol, direct  Type 2 diabetes mellitus with diabetic  neuropathy, with long-term current use of insulin (HCC) Assessment & Plan: At last visit her regimen was changed from insulin to GLP 1 agonist.   Review of CBG data suggests her A1c will be < 6.5  No changes to regimen today   Lab Results  Component Value Date   HGBA1C 7.0 (H) 12/14/2022   Lab Results  Component Value Date   LABMICR 25.6 02/14/2021   MICROALBUR 4.0 03/20/2022   MICROALBUR 1.8 02/20/2020       Orders: -     Comprehensive metabolic panel -     Hemoglobin A1c -     Microalbumin / creatinine urine ratio; Future  Encounter for immunization -     Flu Vaccine Trivalent High Dose (Fluad)  Stage 3b chronic kidney disease (HCC) Assessment & Plan: GFR has been declining since March.  She has been referred to Nephrology  Lab Results  Component Value Date   CREATININE 1.30 (H) 01/29/2023     Lab Results  Component Value Date   LABMICR 25.6 02/14/2021   MICROALBUR 4.0 03/20/2022   MICROALBUR 1.8 02/20/2020     Lab Results  Component Value Date   CREATININE 1.30 (H) 01/29/2023      Acquired hypothyroidism Assessment & Plan: TSH NORMALIZED  on 100 MCG DAILY   Lab Results  Component Value Date   TSH 3.49 12/14/2022         I provided 36 minutes of face-to-face time during this encounter reviewing patient's last visit with me, patient's  most recent visit with  nephrology,  and neurology,  recent surgical and non surgical procedures, previous  labs and imaging studies, counseling on currently addressed issues,  and post visit ordering to diagnostics and therapeutics .   Follow-up: No follow-ups on file.   Sherlene Shams, MD

## 2023-03-16 NOTE — Assessment & Plan Note (Signed)
At last visit her regimen was changed from insulin to GLP 1 agonist.   Review of CBG data suggests her A1c will be < 6.5  No changes to regimen today   Lab Results  Component Value Date   HGBA1C 7.0 (H) 12/14/2022   Lab Results  Component Value Date   LABMICR 25.6 02/14/2021   MICROALBUR 4.0 03/20/2022   MICROALBUR 1.8 02/20/2020

## 2023-03-16 NOTE — Assessment & Plan Note (Signed)
she reports compliance with medication regimen  and is  at goal on  50 mg losartan .  Previous recurrent hypotension on 100 mg losartan dose resulted in recurrent falls

## 2023-03-16 NOTE — Assessment & Plan Note (Signed)
TSH NORMALIZED  on 100 MCG DAILY   Lab Results  Component Value Date   TSH 3.49 12/14/2022

## 2023-03-16 NOTE — Patient Instructions (Addendum)
YOUR BP IS AT GOAL. CONTINUE 50 MG LOSARTAN AND AND CURRENT ATENOLOL DOSE   IF BP READINGS START TO RISE ABOVE 140/80,  SEND ME READINGS VIA MYCHART AND I WILL ADJUST YOUR BP MEDS    YOU ARE LOSING WEIGHT AND HAVING LOW SUGARS IN THE MORNING . CONTINUE 0.5 MG OZEMPIC FOR NOW AND ALWAYS EAT A SNACK AT NIGHT.    IF LOW BS CONTINUE,  REDUCE OZEMPIC DOSE TO 0.25 MG WEEKLY    I ENCOURAGE YOU TO WALK (WITHOUT YOUR DOG) FOR 30 MINUTES DAILY FOR EXERCISE    SEE YOU IN 6 MONTHS (UNLESS A1C IS > 7.0)

## 2023-03-16 NOTE — Assessment & Plan Note (Signed)
GFR has been declining since March.  She has been referred to Nephrology  Lab Results  Component Value Date   CREATININE 1.30 (H) 01/29/2023     Lab Results  Component Value Date   LABMICR 25.6 02/14/2021   MICROALBUR 4.0 03/20/2022   MICROALBUR 1.8 02/20/2020     Lab Results  Component Value Date   CREATININE 1.30 (H) 01/29/2023

## 2023-03-17 LAB — COMPREHENSIVE METABOLIC PANEL
ALT: 10 U/L (ref 0–35)
AST: 14 U/L (ref 0–37)
Albumin: 4 g/dL (ref 3.5–5.2)
Alkaline Phosphatase: 87 U/L (ref 39–117)
BUN: 18 mg/dL (ref 6–23)
CO2: 27 meq/L (ref 19–32)
Calcium: 9.1 mg/dL (ref 8.4–10.5)
Chloride: 101 meq/L (ref 96–112)
Creatinine, Ser: 1.03 mg/dL (ref 0.40–1.20)
GFR: 53.35 mL/min — ABNORMAL LOW (ref >60.00)
Glucose, Bld: 90 mg/dL (ref 70–99)
Potassium: 4.3 meq/L (ref 3.5–5.1)
Sodium: 141 meq/L (ref 135–145)
Total Bilirubin: 0.5 mg/dL (ref 0.2–1.2)
Total Protein: 6.9 g/dL (ref 6.0–8.3)

## 2023-03-17 LAB — LIPID PANEL
Cholesterol: 124 mg/dL (ref 0–200)
HDL: 45.2 mg/dL (ref >39.00)
LDL Cholesterol: 49 mg/dL (ref 0–99)
NonHDL: 78.69
Total CHOL/HDL Ratio: 3
Triglycerides: 148 mg/dL (ref 0.0–149.0)
VLDL: 29.6 mg/dL (ref 0.0–40.0)

## 2023-03-17 LAB — HEMOGLOBIN A1C: Hgb A1c MFr Bld: 6 % (ref 4.6–6.5)

## 2023-03-17 LAB — LDL CHOLESTEROL, DIRECT: Direct LDL: 73 mg/dL

## 2023-03-22 LAB — MICROALBUMIN / CREATININE URINE RATIO
Creatinine,U: 118.9 mg/dL
Microalb Creat Ratio: 0.9 mg/g (ref 0.0–30.0)
Microalb, Ur: 1 mg/dL (ref 0.0–1.9)

## 2023-03-30 ENCOUNTER — Other Ambulatory Visit: Payer: Self-pay

## 2023-03-30 ENCOUNTER — Encounter: Payer: Self-pay | Admitting: Gastroenterology

## 2023-03-30 ENCOUNTER — Ambulatory Visit: Payer: Medicare HMO | Admitting: Gastroenterology

## 2023-03-30 VITALS — BP 132/74 | HR 72 | Temp 98.0°F | Ht 64.0 in | Wt 143.8 lb

## 2023-03-30 DIAGNOSIS — R1310 Dysphagia, unspecified: Secondary | ICD-10-CM | POA: Diagnosis not present

## 2023-03-30 DIAGNOSIS — R1319 Other dysphagia: Secondary | ICD-10-CM

## 2023-03-30 DIAGNOSIS — Z86010 Personal history of colonic polyps: Secondary | ICD-10-CM

## 2023-03-30 MED ORDER — NA SULFATE-K SULFATE-MG SULF 17.5-3.13-1.6 GM/177ML PO SOLN: 354.0000 mL | Freq: Once | ORAL | 0 refills | Status: AC

## 2023-03-30 NOTE — Addendum Note (Signed)
Addended by: Adela Ports on: 03/30/2023 05:05 PM   Modules accepted: Orders

## 2023-03-30 NOTE — Addendum Note (Signed)
Addended by: Adela Ports on: 03/30/2023 02:22 PM   Modules accepted: Orders

## 2023-03-30 NOTE — Progress Notes (Signed)
[name] MD, MRCP(U.K) [address]  [address]  [place], [place] [address]  Main: [phone]  Fax: [phone]   Gastroenterology Consultation  Referring Provider:     Sherlene Shams, MD Primary Care Physician:  Sherlene Shams, MD Primary Gastroenterologist:  Dr. [name]  Reason for Consultation:     Dysphagia        HPI:   Maria Hinton is a 77 y.o. y/o female referred for consultation & management  by Dr. Sherlene Shams, MD.    She says she has had difficulty swallowing for a few months.  Affect solids and liquids.  Points to her neck as a site where the foods do not go down and sometimes comes back up.  At times is associated with coughing.  Denies having a dry mouth.  Does take tramadol on a regular basis.  Also on semaglutide to lose weight.  She also takes omeprazole for acid reflux.  No unintentional weight loss.  2019 : EGD: Biopsies showed no eosinophils.  01/11/2023: Barium swallow : Moderate esophageal dysmotility , trace vestibular penetration.     Past Medical History:  Diagnosis Date   Actinic keratoses    follows with dermatology    Arthritis    Asthma    Chronic back pain    DDD,herniated disc/spondylosis/radiculopathy   COPD (chronic obstructive pulmonary disease) (HCC)    Diabetes mellitus    1980s   Early cataracts, bilateral    Dr. Jerolyn Center - [facility]   GERD (gastroesophageal reflux disease)    takes Omeprazole daily   Hemorrhoids    History of colonic polyps    colonoscopy 04/2012 - Dr Bluford Kaufmann   Hyperlipidemia    takes Pravastatin daily   Hypertension    takes Metoprolol/HCTZ daily   Hyperthyroidism    Insomnia    Joint pain    fingers   Joint swelling    Pneumonia    x 2 ;in Dec 2010/2011   PONV (postoperative nausea and vomiting)    Right leg weakness     Past Surgical History:  Procedure Laterality Date   ABDOMINAL HYSTERECTOMY     at age 51   BACK SURGERY  20+yrs ago   CATARACT EXTRACTION W/PHACO Left  01/04/2019   Procedure: CATARACT EXTRACTION PHACO AND INTRAOCULAR LENS PLACEMENT (IOC)  LEFT;  Surgeon: Lockie Mola, MD;  Location: [facility];  Service: Ophthalmology;  Laterality: Left;  GIVE IV ZOFRAN   CATARACT EXTRACTION W/PHACO Right 02/01/2019   Procedure: CATARACT EXTRACTION PHACO AND INTRAOCULAR LENS PLACEMENT (IOC)  RIGHT DIABETIC;  Surgeon: Lockie Mola, MD;  Location: [facility];  Service: Ophthalmology;  Laterality: Right;  DIABETIC GIVE IV ZOFRAN   COLONOSCOPY     COLONOSCOPY N/A 05/13/2015   Procedure: COLONOSCOPY;  Surgeon: Wallace Cullens, MD;  Location: [facility] ENDOSCOPY;  Service: Gastroenterology;  Laterality: N/A;   COLONOSCOPY WITH PROPOFOL N/A 05/10/2020   Procedure: COLONOSCOPY WITH PROPOFOL;  Surgeon: Pasty Spillers, MD;  Location: [facility] ENDOSCOPY;  Service: Endoscopy;  Laterality: N/A;   ESOPHAGOGASTRODUODENOSCOPY  2011   ESOPHAGOGASTRODUODENOSCOPY (EGD) WITH PROPOFOL N/A 09/02/2017   Procedure: ESOPHAGOGASTRODUODENOSCOPY (EGD) WITH PROPOFOL;  Surgeon: Pasty Spillers, MD;  Location: [facility] ENDOSCOPY;  Service: Endoscopy;  Laterality: N/A;   FOOT SURGERY  2011   bunionectomy and knot removed from bottom of both feet   JOINT REPLACEMENT Left 2014   hip  Kraskinski   LUMBAR LAMINECTOMY/DECOMPRESSION MICRODISCECTOMY  09/10/2011   Procedure:  LUMBAR LAMINECTOMY/DECOMPRESSION MICRODISCECTOMY 1 LEVEL;  Surgeon: Hewitt Shorts, MD;  Location: [facility];  Service: Neurosurgery;  Laterality: Right;  RIGHT Lumbar  Laminotomy and microdiskectomy Lumbar Four-Five   LUMBAR LAMINECTOMY/DECOMPRESSION MICRODISCECTOMY N/A 06/26/2015   Procedure: LUMBAR LAMINECTOMY/DECOMPRESSION MICRODISCECTOMY 1 LEVEL;  Surgeon: Shirlean Kelly, MD;  Location: [facility];  Service: Neurosurgery;  Laterality: N/A;  L3 and L4 Laminectomies   MELANOMA EXCISION  2011   on nose/back on neck   THORACIC DISC SURGERY  at age 47 and 52    mass of veins that had gotten into  muscle and wrapped around rib;3 ribs also removed from left side   TONSILLECTOMY  as a child   and adenoids    TOTAL HIP ARTHROPLASTY Left 2014   Dr. Martha Clan   TUBAL LIGATION     VAGINA SURGERY     vaginal wall ruptured     Prior to Admission medications   Medication Sig Start Date End Date Taking? Authorizing Provider  albuterol (VENTOLIN HFA) 108 (90 Base) MCG/ACT inhaler Inhale 2 puffs into the lungs every 6 (six) hours as needed for wheezing. 09/04/22   Sherlene Shams, MD  aspirin EC 81 MG tablet Take 81 mg by mouth daily.    [provider]  atenolol (TENORMIN) 50 MG tablet Take 1 tablet (50 mg total) by mouth 2 (two) times daily. 01/29/23   Sherlene Shams, MD  atorvastatin (LIPITOR) 40 MG tablet Take 1 tablet (40 mg total) by mouth daily. 09/04/22   Sherlene Shams, MD  BD PEN NEEDLE NANO 2ND GEN 32G X 4 MM MISC USE DAILY AS DIRECTED WITH LEVEMIR 06/16/22   Sherlene Shams, MD  gabapentin (NEURONTIN) 100 MG capsule Take 1 capsule (100 mg total) by mouth 3 (three) times daily. 01/29/23   Sherlene Shams, MD  gabapentin (NEURONTIN) 300 MG capsule Take 1 capsule (300 mg total) by mouth 4 (four) times daily. 01/29/23   Sherlene Shams, MD  glucose blood (ONETOUCH VERIO) test strip Use as instructed to check blood sugars 3 times daily 02/14/21   Sherlene Shams, MD  levothyroxine (SYNTHROID) 100 MCG tablet Take 1 tablet (100 mcg total) by mouth daily. 01/29/23   Sherlene Shams, MD  losartan (COZAAR) 50 MG tablet Take 50 mg by mouth daily. 03/09/23   [provider]  meloxicam (MOBIC) 7.5 MG tablet TAKE 1 TABLET BY MOUTH EVERY DAY 09/29/21   Worthy Rancher B, FNP  metFORMIN (GLUCOPHAGE) 1000 MG tablet TAKE 1 TABLET (1,000 MG TOTAL) BY MOUTH TWICE A DAY WITH FOOD 01/29/23   Sherlene Shams, MD  omeprazole (PRILOSEC) 20 MG capsule Take 20 mg by mouth daily. 02/14/23   [provider]  OneTouch Delica Lancets 30G MISC 1 application by Does not apply route 2 (two) times daily.  Use to check blood sugar up to two times daily. E11.69 04/18/19   Sherlene Shams, MD  Semaglutide,0.25 or 0.5MG /DOS, (OZEMPIC, 0.25 OR 0.5 MG/DOSE,) 2 MG/3ML SOPN Inject 0.5 mg into the skin once a week. 09/28/22   Sherlene Shams, MD  temazepam (RESTORIL) 7.5 MG capsule TAKE 1 CAPSULE (7.5 MG TOTAL) BY MOUTH EVERY DAY AT BEDTIME AS NEEDED FOR SLEEP 03/16/23   Worthy Rancher B, FNP  traMADol (ULTRAM) 50 MG tablet TAKE 1 TABLET BY MOUTH EVERY 6 (SIX) HOURS AS NEEDED FOR MUSCULOSKELETAL PAIN 12/30/22   Sherlene Shams, MD  vitamin B-12 (CYANOCOBALAMIN) 500 MCG tablet Take 500 mcg by  mouth daily.    [provider]    Family History  Problem Relation Age of Onset   COPD Mother        Emphysema   Diabetes Mother    Other Mother        Died in house fire at age 32.   Heart disease Father    Seizures Father    Cancer Father        Lung cancer with brain metastasis   Cancer Brother    Heart disease Brother        valve replacement   COPD Brother    Asthma Brother    COPD Son 41   Diabetes Maternal Grandmother    Cancer Other        aunt ? maternal vs paternal side lung cancer    Anesthesia problems Neg Hx    Hypotension Neg Hx    Malignant hyperthermia Neg Hx    Pseudochol deficiency Neg Hx    Thyroid disease Neg Hx    Breast cancer Neg Hx      Social History   Tobacco Use   Smoking status: Former    Current packs/day: 0.00    Average packs/day: 0.3 packs/day for 20.0 years (5.0 ttl pk-yrs)    Types: Cigarettes    Start date: 58    Quit date: 2018    Years since quitting: 6.7   Smokeless tobacco: Never   Tobacco comments:    Pt quit 11/08/2014.Pt had quit smoking for 13 years before starting back in 2010; per 05/2017 visit quit smoking 2017 smoked total 25 years 1 ppd   Vaping Use   Vaping status: Never Used  Substance Use Topics   Alcohol use: No    Alcohol/week: 0.0 standard drinks of alcohol   Drug use: No    Allergies as of 03/30/2023 - Review Complete  03/30/2023  Allergen Reaction Noted   Vicodin [hydrocodone-acetaminophen] Other (See Comments) 09/08/2011   Acetaminophen  11/12/2020   Vicodin hp [hydrocodone-acetaminophen]  08/07/2020    Review of Systems:    All systems reviewed and negative except where noted in HPI.   Physical Exam:  BP (!) 145/81   Pulse 85   Temp 98 F (36.7 C) (Oral)   Ht 5\' 4"  (1.626 m)   Wt 143 lb 12.8 oz (65.2 kg)   BMI 24.68 kg/m  No LMP recorded. Patient has had a hysterectomy. Psych:  Alert and cooperative. Normal mood and affect. General:   Alert,  Well-developed, well-nourished, pleasant and cooperative in NAD Head:  Normocephalic and atraumatic. Eyes:  Sclera clear, no icterus.   Conjunctiva pink.  Neurologic:  Alert and oriented x3;  grossly normal neurologically. Psych:  Alert and cooperative. Normal mood and affect.  Imaging Studies: No results found.  Assessment and Plan:   Maria Hinton is a 78 y.o. y/o female has been referred for dysphagia.  Barium swallow tablet showed some vestibular penetration.  It is possible that she has transfer dysphagia which may also be due to tramadol.  Advised her to stop taking the tramadol and watch for the next 2 weeks if her swallowing gets better.   Plan  Modified barium swallow mid-October EGD to evaluate for dysphagia and colonoscopy performed the same time due to personal history of colon polyps. Stop tramadol as advised  I have discussed alternative options, risks & benefits,  which include, but are not limited to, bleeding, infection, perforation,respiratory complication & drug reaction.  The patient agrees  with this plan & written consent will be obtained.    Follow up in end of November  Dr [name] MD,MRCP(U.K)

## 2023-04-01 ENCOUNTER — Telehealth: Payer: Self-pay | Admitting: Gastroenterology

## 2023-04-01 NOTE — Telephone Encounter (Signed)
Patient called in because she wanted to know is her barium test has been schedule.

## 2023-04-02 NOTE — Telephone Encounter (Signed)
Called patient but left her a detailed message letting her know that I had reached out to Mrs. Maria Hinton from specialty scheduling and had to leave her a voicemail with her information so she cold reach out to her. I gave her my name and number to call me back if she had any questions for me.

## 2023-04-12 ENCOUNTER — Ambulatory Visit
Admission: RE | Admit: 2023-04-12 | Discharge: 2023-04-12 | Disposition: A | Discharge status: Home / Self Care | Payer: Medicare HMO | Source: Ambulatory Visit | Attending: Gastroenterology | Admitting: Gastroenterology

## 2023-04-12 DIAGNOSIS — R1319 Other dysphagia: Secondary | ICD-10-CM | POA: Insufficient documentation

## 2023-04-12 NOTE — Therapy (Signed)
Modified Barium Swallow Study  Patient Details  Name: Maria Hinton MRN: [id number] Date of Birth: 06/12/1946  Today's Date: 04/12/2023  Modified Barium Swallow completed.  Full report located under Chart Review in the Imaging Section.  History of Present Illness Maria Hinton is a 77 y.o. female with past medical history of hypertension, coronary artery disease, diabetes, peripheral vascular disease, chronic back pains, hyperlipidemia, chronic kidney disease associated with proteinuria, and GERD.    Pt with reports of dysphagia with solids and liquids c/b globus sensation and regurgitation. Pt takes omeprazole for GERD. Barium Swallowing, 01/2023, "1.  Moderate esophageal dysmotility 2.  Trace vestibular penetration" EGD tentatively scheduled for 05/19/23.   Clinical Impression Pt seen for MBSS. Pt presents with a mild oropharyngeal dysphagia and suspected co-existing esophageal dysphagia. Age-related swallowing changes appreciated (e.g. delayed swallow initiation) as well as trace-mild oral and pharyngeal stasis which increased with viscocity and reduced with spontaneous secondary swallows/prompted liquid wash and during the swallow transient aspiration of thin liquids during sequential sips. Above mentioned deficits likely due to reduced base of tongue retraction and reduced laryngeal vestibule closure. Globus sensatoin was not reproduced during today's evaluation; however, retention of barium appreciated prior to esophageal sweep. Pt also with intermittent throat clearing which did not necessarily correlate with pharyngeal swallow function. Recommend continuation of a regular diet with thin liquids with standard aspiration precautions/safe swallowing strategies and reflux precautions. No further SLP services warranted at this time. Recommend pt to continue to f/u with GI for further assessment. Factors that may increase risk of adverse event in presence of aspiration Rubye Oaks & Clearance Coots 2021):   n/a  Swallow Evaluation Recommendations Recommendations: PO diet PO Diet Recommendation: Regular;Thin liquids (Level 0) Liquid Administration via: Spoon;Cup;Straw Medication Administration:  (as tolerated) Supervision: Patient able to self-feed Swallowing strategies  : Slow rate;Small bites/sips;Follow solids with liquids Postural changes:  (reflux precautions; upright during and 60-90 minutes after POs) Oral care recommendations: Oral care BID (2x/day);Pt independent with oral care Recommended consults: Consider esophageal assessment (pt with EGD tentatively scheduled for November 2024)     Clyde Canterbury, M.S., CCC-SLP Speech-Language Pathologist [facility] [phone] (ASCOM)  Alessandra Bevels Keysi Oelkers 04/12/2023,1:17 PM

## 2023-04-15 ENCOUNTER — Other Ambulatory Visit (HOSPITAL_COMMUNITY): Payer: Self-pay

## 2023-04-15 ENCOUNTER — Telehealth: Payer: Self-pay

## 2023-04-19 ENCOUNTER — Other Ambulatory Visit (HOSPITAL_COMMUNITY): Payer: Self-pay

## 2023-04-19 NOTE — Telephone Encounter (Signed)
PAP: Application for Ozempic, Fiasp, and Pen needles has been submitted to PAP Companies: [employer], Therapist, sports for [employer]

## 2023-04-23 ENCOUNTER — Encounter: Payer: Self-pay | Admitting: Internal Medicine

## 2023-04-23 NOTE — Telephone Encounter (Signed)
Requesting: Temazepam Contract: No UDS: No Last Visit: 03/16/2023 Next Visit: 06/15/2023 Last Refill: 03/16/2023  Please Advise

## 2023-04-24 MED ORDER — TEMAZEPAM 7.5 MG PO CAPS: 7.5000 mg | ORAL_CAPSULE | Freq: Every evening | ORAL | 5 refills | Status: DC | PRN

## 2023-05-04 ENCOUNTER — Telehealth: Payer: Self-pay

## 2023-05-04 NOTE — Telephone Encounter (Signed)
Spoke with pt to let her know that we have received her pt assistance medication in the the office and it is ready for pick up. Pt stated that she will pick it up tomorrow.    Ozempic 0.5 mg: 3 boxes

## 2023-05-05 NOTE — Telephone Encounter (Signed)
Pt has picked up medication.  

## 2023-05-18 ENCOUNTER — Encounter: Payer: Self-pay | Admitting: Gastroenterology

## 2023-05-19 ENCOUNTER — Ambulatory Visit
Admission: RE | Admit: 2023-05-19 | Discharge: 2023-05-19 | Disposition: A | Discharge status: Home / Self Care | Payer: Medicare HMO | Attending: Gastroenterology | Admitting: Gastroenterology

## 2023-05-19 ENCOUNTER — Encounter
Admission: RE | Disposition: A | Discharge status: Home / Self Care | Payer: Self-pay | Source: Home / Self Care | Attending: Gastroenterology

## 2023-05-19 ENCOUNTER — Ambulatory Visit: Payer: Medicare HMO | Admitting: Anesthesiology

## 2023-05-19 ENCOUNTER — Encounter: Payer: Self-pay | Admitting: Gastroenterology

## 2023-05-19 ENCOUNTER — Other Ambulatory Visit: Payer: Self-pay

## 2023-05-19 DIAGNOSIS — Z860101 Personal history of adenomatous and serrated colon polyps: Secondary | ICD-10-CM | POA: Diagnosis not present

## 2023-05-19 DIAGNOSIS — Z860100 Personal history of colon polyps, unspecified: Secondary | ICD-10-CM

## 2023-05-19 DIAGNOSIS — Z7984 Long term (current) use of oral hypoglycemic drugs: Secondary | ICD-10-CM | POA: Insufficient documentation

## 2023-05-19 DIAGNOSIS — D125 Benign neoplasm of sigmoid colon: Secondary | ICD-10-CM

## 2023-05-19 DIAGNOSIS — I10 Essential (primary) hypertension: Secondary | ICD-10-CM | POA: Diagnosis not present

## 2023-05-19 DIAGNOSIS — J4489 Other specified chronic obstructive pulmonary disease: Secondary | ICD-10-CM | POA: Insufficient documentation

## 2023-05-19 DIAGNOSIS — E119 Type 2 diabetes mellitus without complications: Secondary | ICD-10-CM | POA: Insufficient documentation

## 2023-05-19 DIAGNOSIS — Z79899 Other long term (current) drug therapy: Secondary | ICD-10-CM | POA: Diagnosis not present

## 2023-05-19 DIAGNOSIS — Z90710 Acquired absence of both cervix and uterus: Secondary | ICD-10-CM | POA: Insufficient documentation

## 2023-05-19 DIAGNOSIS — Z1211 Encounter for screening for malignant neoplasm of colon: Secondary | ICD-10-CM | POA: Diagnosis present

## 2023-05-19 DIAGNOSIS — R1310 Dysphagia, unspecified: Secondary | ICD-10-CM

## 2023-05-19 DIAGNOSIS — K6289 Other specified diseases of anus and rectum: Secondary | ICD-10-CM

## 2023-05-19 DIAGNOSIS — Z09 Encounter for follow-up examination after completed treatment for conditions other than malignant neoplasm: Secondary | ICD-10-CM | POA: Insufficient documentation

## 2023-05-19 DIAGNOSIS — R1319 Other dysphagia: Secondary | ICD-10-CM | POA: Diagnosis present

## 2023-05-19 DIAGNOSIS — E785 Hyperlipidemia, unspecified: Secondary | ICD-10-CM | POA: Insufficient documentation

## 2023-05-19 DIAGNOSIS — Z87891 Personal history of nicotine dependence: Secondary | ICD-10-CM | POA: Diagnosis not present

## 2023-05-19 DIAGNOSIS — D126 Benign neoplasm of colon, unspecified: Secondary | ICD-10-CM | POA: Diagnosis present

## 2023-05-19 DIAGNOSIS — K642 Third degree hemorrhoids: Secondary | ICD-10-CM | POA: Diagnosis not present

## 2023-05-19 DIAGNOSIS — K219 Gastro-esophageal reflux disease without esophagitis: Secondary | ICD-10-CM | POA: Insufficient documentation

## 2023-05-19 DIAGNOSIS — G8929 Other chronic pain: Secondary | ICD-10-CM | POA: Diagnosis not present

## 2023-05-19 HISTORY — PX: COLONOSCOPY WITH PROPOFOL: SHX5780

## 2023-05-19 HISTORY — PX: POLYPECTOMY: SHX5525

## 2023-05-19 HISTORY — PX: BIOPSY: SHX5522

## 2023-05-19 HISTORY — PX: ESOPHAGOGASTRODUODENOSCOPY (EGD) WITH PROPOFOL: SHX5813

## 2023-05-19 LAB — GLUCOSE, CAPILLARY: Glucose-Capillary: 121 mg/dL — ABNORMAL HIGH (ref 70–99)

## 2023-05-19 SURGERY — COLONOSCOPY WITH PROPOFOL
Anesthesia: General

## 2023-05-19 MED ORDER — PROPOFOL 500 MG/50ML IV EMUL
INTRAVENOUS | Status: DC | PRN
  Administered 2023-05-19: 120 ug/kg/min via INTRAVENOUS

## 2023-05-19 MED ORDER — DEXMEDETOMIDINE HCL IN NACL 80 MCG/20ML IV SOLN
INTRAVENOUS | Status: DC | PRN
  Administered 2023-05-19: 8 ug via INTRAVENOUS

## 2023-05-19 MED ORDER — DEXMEDETOMIDINE HCL IN NACL 80 MCG/20ML IV SOLN
INTRAVENOUS | Status: AC
  Filled 2023-05-19: qty 20

## 2023-05-19 MED ORDER — PROPOFOL 1000 MG/100ML IV EMUL
INTRAVENOUS | Status: AC
  Filled 2023-05-19: qty 100

## 2023-05-19 MED ORDER — PROPOFOL 10 MG/ML IV BOLUS
INTRAVENOUS | Status: DC | PRN
  Administered 2023-05-19: 70 mg via INTRAVENOUS
  Administered 2023-05-19: 30 mg via INTRAVENOUS

## 2023-05-19 MED ORDER — LIDOCAINE HCL (PF) 2 % IJ SOLN
INTRAMUSCULAR | Status: AC
  Filled 2023-05-19: qty 5

## 2023-05-19 MED ORDER — LIDOCAINE HCL (CARDIAC) PF 100 MG/5ML IV SOSY
PREFILLED_SYRINGE | INTRAVENOUS | Status: DC | PRN
  Administered 2023-05-19: 50 mg via INTRAVENOUS

## 2023-05-19 MED ORDER — SODIUM CHLORIDE 0.9 % IV SOLN: INTRAVENOUS | Status: DC

## 2023-05-19 MED ORDER — PROPOFOL 10 MG/ML IV BOLUS
INTRAVENOUS | Status: AC
Start: 2023-05-19 — End: ?
  Filled 2023-05-19: qty 20

## 2023-05-19 MED ORDER — STERILE WATER FOR IRRIGATION IR SOLN
Status: DC | PRN
  Administered 2023-05-19: 60 mL

## 2023-05-19 NOTE — Transfer of Care (Signed)
Immediate Anesthesia Transfer of Care Note  Patient: Maria Hinton  Procedure(s) Performed: COLONOSCOPY WITH PROPOFOL ESOPHAGOGASTRODUODENOSCOPY (EGD) WITH PROPOFOL BIOPSY POLYPECTOMY  Patient Location: PACU and Endoscopy Unit  Anesthesia Type:General  Level of Consciousness: drowsy and patient cooperative  Airway & Oxygen Therapy: Patient Spontanous Breathing  Post-op Assessment: Report given to RN and Post -op Vital signs reviewed and stable  Post vital signs: Reviewed and stable  Last Vitals:  Vitals Value Taken Time  BP 99/53 05/19/23 0812  Temp    Pulse 74 05/19/23 0812  Resp 15 05/19/23 0812  SpO2 98 % 05/19/23 0812    Last Pain:  Vitals:   05/19/23 0812  TempSrc:   PainSc: Asleep         Complications: No notable events documented.

## 2023-05-19 NOTE — Anesthesia Preprocedure Evaluation (Signed)
Anesthesia Evaluation  Patient identified by MRN, date of birth, ID band Patient awake    Reviewed: Allergy & Precautions, NPO status , Patient's Chart, lab work & pertinent test results  History of Anesthesia Complications (+) PONV and history of anesthetic complications  Airway Mallampati: III  TM Distance: <3 FB Neck ROM: full    Dental  (+) Chipped   Pulmonary shortness of breath and with exertion, asthma , COPD, former smoker   Pulmonary exam normal        Cardiovascular hypertension, negative cardio ROS Normal cardiovascular exam     Neuro/Psych  PSYCHIATRIC DISORDERS       Neuromuscular disease    GI/Hepatic Neg liver ROS, hiatal hernia,GERD  Controlled,,  Endo/Other  diabetesHypothyroidism Hyperthyroidism   Renal/GU Renal disease  negative genitourinary   Musculoskeletal   Abdominal   Peds  Hematology negative hematology ROS (+)   Anesthesia Other Findings Patient reports that they do not think that any food or pills are stuck in their throat at this time.  Past Medical History: No date: Actinic keratoses     Comment:  follows with dermatology  No date: Arthritis No date: Asthma No date: Chronic back pain     Comment:  DDD,herniated disc/spondylosis/radiculopathy No date: COPD (chronic obstructive pulmonary disease) (HCC) No date: Diabetes mellitus     Comment:  1980s No date: Early cataracts, bilateral     Comment:  Dr. Jerolyn Center - Donivan Scull [place] No date: GERD (gastroesophageal reflux disease)     Comment:  takes Omeprazole daily No date: Hemorrhoids No date: History of colonic polyps     Comment:  colonoscopy 04/2012 - Dr Bluford Kaufmann No date: Hyperlipidemia     Comment:  takes Pravastatin daily No date: Hypertension     Comment:  takes Metoprolol/HCTZ daily No date: Hyperthyroidism No date: Insomnia No date: Joint pain     Comment:  fingers No date: Joint swelling No date: Pneumonia      Comment:  x 2 ;in Dec 2010/2011 No date: PONV (postoperative nausea and vomiting) No date: Right leg weakness  Past Surgical History: No date: ABDOMINAL HYSTERECTOMY     Comment:  at age 43 20+yrs ago: BACK SURGERY 01/04/2019: CATARACT EXTRACTION W/PHACO; Left     Comment:  Procedure: CATARACT EXTRACTION PHACO AND INTRAOCULAR               LENS PLACEMENT (IOC)  LEFT;  Surgeon: Lockie Mola, MD;  Location: [facility];  Service:               Ophthalmology;  Laterality: Left;  GIVE IV ZOFRAN 02/01/2019: CATARACT EXTRACTION W/PHACO; Right     Comment:  Procedure: CATARACT EXTRACTION PHACO AND INTRAOCULAR               LENS PLACEMENT (IOC)  RIGHT DIABETIC;  Surgeon:               Lockie Mola, MD;  Location: [facility];              Service: Ophthalmology;  Laterality: Right;                DIABETIC GIVE IV ZOFRAN No date: COLONOSCOPY 05/13/2015: COLONOSCOPY; N/A     Comment:  Procedure: COLONOSCOPY;  Surgeon: Wallace Cullens, MD;                Location: [facility] ENDOSCOPY;  Service:  Gastroenterology;                Laterality: N/A; 05/10/2020: COLONOSCOPY WITH PROPOFOL; N/A     Comment:  Procedure: COLONOSCOPY WITH PROPOFOL;  Surgeon:               Pasty Spillers, MD;  Location: [facility] ENDOSCOPY;                Service: Endoscopy;  Laterality: N/A; 2011: ESOPHAGOGASTRODUODENOSCOPY 09/02/2017: ESOPHAGOGASTRODUODENOSCOPY (EGD) WITH PROPOFOL; N/A     Comment:  Procedure: ESOPHAGOGASTRODUODENOSCOPY (EGD) WITH               PROPOFOL;  Surgeon: Pasty Spillers, MD;  Location:               [facility] ENDOSCOPY;  Service: Endoscopy;  Laterality: N/A; 2011: FOOT SURGERY     Comment:  bunionectomy and knot removed from bottom of both feet 2014: JOINT REPLACEMENT; Left     Comment:  hip  Kraskinski 09/10/2011: LUMBAR LAMINECTOMY/DECOMPRESSION MICRODISCECTOMY     Comment:  Procedure: LUMBAR LAMINECTOMY/DECOMPRESSION               MICRODISCECTOMY 1 LEVEL;   Surgeon: Hewitt Shorts, MD;              Location: [facility];  Service: Neurosurgery;                Laterality: Right;  RIGHT Lumbar  Laminotomy and               microdiskectomy Lumbar Four-Five 06/26/2015: LUMBAR LAMINECTOMY/DECOMPRESSION MICRODISCECTOMY; N/A     Comment:  Procedure: LUMBAR LAMINECTOMY/DECOMPRESSION               MICRODISCECTOMY 1 LEVEL;  Surgeon: Shirlean Kelly, MD;                Location: [facility];  Service: Neurosurgery;                Laterality: N/A;  L3 and L4 Laminectomies 2011: MELANOMA EXCISION     Comment:  on nose/back on neck at age 72 and 32 : THORACIC DISC SURGERY     Comment:  mass of veins that had gotten into muscle and wrapped               around rib;3 ribs also removed from left side as a child: TONSILLECTOMY     Comment:  and adenoids  2014: TOTAL HIP ARTHROPLASTY; Left     Comment:  Dr. Martha Clan No date: TUBAL LIGATION No date: VAGINA SURGERY     Comment:  vaginal wall ruptured   BMI    Body Mass Index: 24.55 kg/m      Reproductive/Obstetrics negative OB ROS                             Anesthesia Physical Anesthesia Plan  ASA: 3  Anesthesia Plan: General   Post-op Pain Management:    Induction: Intravenous  PONV Risk Score and Plan: Propofol infusion and TIVA  Airway Management Planned: Natural Airway and Nasal Cannula  Additional Equipment:   Intra-op Plan:   Post-operative Plan:   Informed Consent: I have reviewed the patients History and Physical, chart, labs and discussed the procedure including the risks, benefits and alternatives for the proposed anesthesia with the patient or authorized representative who has indicated his/her understanding and acceptance.     Dental Advisory Given  Plan  Discussed with: Anesthesiologist, CRNA and Surgeon  Anesthesia Plan Comments: (Patient consented for risks of anesthesia including but not limited to:  - adverse reactions to  medications - risk of airway placement if required - damage to eyes, teeth, lips or other oral mucosa - nerve damage due to positioning  - sore throat or hoarseness - Damage to heart, brain, nerves, lungs, other parts of body or loss of life  Patient voiced understanding and assent.)       Anesthesia Quick Evaluation

## 2023-05-19 NOTE — Op Note (Signed)
[facility] Gastroenterology Patient Name: Maria Hinton Procedure Date: 05/19/2023 7:58 AM MRN: [id number] Account #: [id number] Date of Birth: 05-20-1946 Admit Type: Ambulatory Age: 77 Room: [facility] ENDO ROOM 3 Gender: Female Note Status: Finalized Instrument Name: Nelda Marseille [id number] Procedure:             Colonoscopy Indications:           Surveillance: Personal history of adenomatous polyps                         on last colonoscopy > 3 years ago Providers:             [name] MD, MD Medicines:             Monitored Anesthesia Care Complications:         No immediate complications. Procedure:             Pre-Anesthesia Assessment:                        - Prior to the procedure, a History and Physical was                         performed, and patient medications, allergies and                         sensitivities were reviewed. The patient's tolerance                         of previous anesthesia was reviewed.                        - The risks and benefits of the procedure and the                         sedation options and risks were discussed with the                         patient. All questions were answered and informed                         consent was obtained.                        - ASA Grade Assessment: II - A patient with mild                         systemic disease.                        After obtaining informed consent, the colonoscope was                         passed under direct vision. Throughout the procedure,                         the patient's blood pressure, pulse, and oxygen                         saturations were monitored continuously. The  Colonoscope was introduced through the and advanced to                         the the cecum, identified by the appendiceal orifice.                         The colonoscopy was performed with ease. The patient                         tolerated the procedure  well. The quality of the bowel                         preparation was excellent. The ileocecal valve,                         appendiceal orifice, and rectum were photographed. Findings:      The perianal exam findings include internal hemorrhoids that prolapse       with straining, but require manual replacement into the anal canal       (Grade III).      The digital rectal exam findings include decreased sphincter tone.      Two sessile polyps were found in the sigmoid colon. The polyps were 4 to       5 mm in size. These polyps were removed with a cold snare. Resection and       retrieval were complete.      The exam was otherwise without abnormality on direct and retroflexion       views. Impression:            - Internal hemorrhoids that prolapse with straining,                         but require manual replacement into the anal canal                         (Grade III) found on perianal exam.                        - Decreased sphincter tone found on digital rectal                         exam.                        - Two 4 to 5 mm polyps in the sigmoid colon, removed                         with a cold snare. Resected and retrieved.                        - The examination was otherwise normal on direct and                         retroflexion views. Recommendation:        - Discharge patient to home (with escort).                        - Resume previous diet.                        -  Continue present medications.                        - Await pathology results.                        - Repeat colonoscopy is not recommended due to current                         age (58 years or older) for surveillance. Procedure Code(s):     --- Professional ---                        [phone], Colonoscopy, flexible; with removal of                         tumor(s), polyp(s), or other lesion(s) by snare                         technique Diagnosis Code(s):     --- Professional ---                         Z86.010, Personal history of colonic polyps                        K62.89, Other specified diseases of anus and rectum                        D12.5, Benign neoplasm of sigmoid colon                        K64.2, Third degree hemorrhoids CPT copyright 2022 American Medical Association. All rights reserved. The codes documented in this report are preliminary and upon coder review may  be revised to meet current compliance requirements. [name], MD [name] MD, MD 05/19/2023 8:11:23 AM This report has been signed electronically. Number of Addenda: 0 Note Initiated On: 05/19/2023 7:58 AM Scope Withdrawal Time: 0 hours 7 minutes 1 second  Total Procedure Duration: 0 hours 10 minutes 10 seconds  Estimated Blood Loss:  Estimated blood loss: none.      [facility]

## 2023-05-19 NOTE — Anesthesia Postprocedure Evaluation (Signed)
Anesthesia Post Note  Patient: Maria Hinton  Procedure(s) Performed: COLONOSCOPY WITH PROPOFOL ESOPHAGOGASTRODUODENOSCOPY (EGD) WITH PROPOFOL BIOPSY POLYPECTOMY  Patient location during evaluation: Endoscopy Anesthesia Type: General Level of consciousness: awake and alert Pain management: pain level controlled Vital Signs Assessment: post-procedure vital signs reviewed and stable Respiratory status: spontaneous breathing, nonlabored ventilation, respiratory function stable and patient connected to nasal cannula oxygen Cardiovascular status: blood pressure returned to baseline and stable Postop Assessment: no apparent nausea or vomiting Anesthetic complications: no   No notable events documented.   Last Vitals:  Vitals:   05/19/23 0823 05/19/23 0835  BP: 106/61 (!) 119/53  Pulse: 70 69  Resp: 16 18  Temp:    SpO2: 99% 99%    Last Pain:  Vitals:   05/19/23 0835  TempSrc:   PainSc: 0-No pain                 Cleda Mccreedy Talaya Lamprecht

## 2023-05-19 NOTE — Op Note (Signed)
[facility] Gastroenterology Patient Name: Maria Hinton Procedure Date: 05/19/2023 7:15 AM MRN: [id number] Account #: [id number] Date of Birth: 1945-08-25 Admit Type: Outpatient Age: 77 Room: [facility] ENDO ROOM 3 Gender: Female Note Status: Finalized Instrument Name: Patton Salles Endoscope [id number] Procedure:             Upper GI endoscopy Indications:           Dysphagia Providers:             [name] MD, MD Referring MD:          Duncan Dull, MD (Referring MD) Medicines:             Monitored Anesthesia Care Complications:         No immediate complications. Procedure:             Pre-Anesthesia Assessment:                        - Prior to the procedure, a History and Physical was                         performed, and patient medications, allergies and                         sensitivities were reviewed. The patient's tolerance                         of previous anesthesia was reviewed.                        - ASA Grade Assessment: II - A patient with mild                         systemic disease.                        - ASA Grade Assessment: II - A patient with mild                         systemic disease.                        The colonoscopy was performed with ease. The patient                         tolerated the procedure well. The quality of the bowel                         preparation was excellent. The Endoscope was                         introduced through the and advanced to the third part                         of duodenum. The upper GI endoscopy was accomplished                         with ease. The patient tolerated the procedure well. Findings:      The stomach was normal.      The examined duodenum was normal.  The cardia and gastric fundus were normal on retroflexion.      The examined esophagus was normal. Biopsies were taken with a cold       forceps for histology. Impression:            - Normal stomach.                        -  Normal examined duodenum.                        - Normal esophagus. Biopsied. Recommendation:        - Await pathology results.                        - Perform a colonoscopy today. Procedure Code(s):     --- Professional ---                        [phone], Esophagogastroduodenoscopy, flexible,                         transoral; with biopsy, single or multiple Diagnosis Code(s):     --- Professional ---                        R13.10, Dysphagia, unspecified CPT copyright 2022 American Medical Association. All rights reserved. The codes documented in this report are preliminary and upon coder review may  be revised to meet current compliance requirements. [name], MD [name] MD, MD 05/19/2023 7:58:16 AM This report has been signed electronically. Number of Addenda: 0 Note Initiated On: 05/19/2023 7:15 AM Estimated Blood Loss:  Estimated blood loss: none.      [facility]

## 2023-05-19 NOTE — H&P (Signed)
[name], MD [address], [address], [place], [place], [address] [address], [place], [place], [address] Phone: [phone]  Fax: [phone]  Primary Care Physician:  Sherlene Shams, MD   Pre-Procedure History & Physical: HPI:  Maria Hinton is a 77 y.o. female is here for an endoscopy and colonoscopy    Past Medical History:  Diagnosis Date   Actinic keratoses    follows with dermatology    Arthritis    Asthma    Chronic back pain    DDD,herniated disc/spondylosis/radiculopathy   COPD (chronic obstructive pulmonary disease) (HCC)    Diabetes mellitus    1980s   Early cataracts, bilateral    Dr. Jerolyn Center - [facility] [place]   GERD (gastroesophageal reflux disease)    takes Omeprazole daily   Hemorrhoids    History of colonic polyps    colonoscopy 04/2012 - Dr Bluford Kaufmann   Hyperlipidemia    takes Pravastatin daily   Hypertension    takes Metoprolol/HCTZ daily   Hyperthyroidism    Insomnia    Joint pain    fingers   Joint swelling    Pneumonia    x 2 ;in Dec 2010/2011   PONV (postoperative nausea and vomiting)    Right leg weakness     Past Surgical History:  Procedure Laterality Date   ABDOMINAL HYSTERECTOMY     at age 24   BACK SURGERY  20+yrs ago   CATARACT EXTRACTION W/PHACO Left 01/04/2019   Procedure: CATARACT EXTRACTION PHACO AND INTRAOCULAR LENS PLACEMENT (IOC)  LEFT;  Surgeon: Lockie Mola, MD;  Location: [facility];  Service: Ophthalmology;  Laterality: Left;  GIVE IV ZOFRAN   CATARACT EXTRACTION W/PHACO Right 02/01/2019   Procedure: CATARACT EXTRACTION PHACO AND INTRAOCULAR LENS PLACEMENT (IOC)  RIGHT DIABETIC;  Surgeon: Lockie Mola, MD;  Location: [facility];  Service: Ophthalmology;  Laterality: Right;  DIABETIC GIVE IV ZOFRAN   COLONOSCOPY     COLONOSCOPY N/A 05/13/2015   Procedure: COLONOSCOPY;  Surgeon: Wallace Cullens, MD;  Location: [facility] ENDOSCOPY;  Service: Gastroenterology;  Laterality: N/A;    COLONOSCOPY WITH PROPOFOL N/A 05/10/2020   Procedure: COLONOSCOPY WITH PROPOFOL;  Surgeon: Pasty Spillers, MD;  Location: [facility] ENDOSCOPY;  Service: Endoscopy;  Laterality: N/A;   ESOPHAGOGASTRODUODENOSCOPY  2011   ESOPHAGOGASTRODUODENOSCOPY (EGD) WITH PROPOFOL N/A 09/02/2017   Procedure: ESOPHAGOGASTRODUODENOSCOPY (EGD) WITH PROPOFOL;  Surgeon: Pasty Spillers, MD;  Location: [facility] ENDOSCOPY;  Service: Endoscopy;  Laterality: N/A;   FOOT SURGERY  2011   bunionectomy and knot removed from bottom of both feet   JOINT REPLACEMENT Left 2014   hip  Kraskinski   LUMBAR LAMINECTOMY/DECOMPRESSION MICRODISCECTOMY  09/10/2011   Procedure: LUMBAR LAMINECTOMY/DECOMPRESSION MICRODISCECTOMY 1 LEVEL;  Surgeon: Hewitt Shorts, MD;  Location: [facility] NEURO [facility]S;  Service: Neurosurgery;  Laterality: Right;  RIGHT Lumbar  Laminotomy and microdiskectomy Lumbar Four-Five   LUMBAR LAMINECTOMY/DECOMPRESSION MICRODISCECTOMY N/A 06/26/2015   Procedure: LUMBAR LAMINECTOMY/DECOMPRESSION MICRODISCECTOMY 1 LEVEL;  Surgeon: Shirlean Kelly, MD;  Location: [facility]UR[facility];  Service: Neurosurgery;  Laterality: N/A;  L3 and L4 Laminectomies   MELANOMA EXCISION  2011   on nose/back on neck   THORACIC DISC SURGERY  at age 51 and 68    mass of veins that had gotten into muscle and wrapped around rib;3 ribs also removed from left side   TONSILLECTOMY  as a child   and adenoids    TOTAL HIP ARTHROPLASTY Left 2014   Dr.  Krasinski   TUBAL LIGATION     VAGINA SURGERY     vaginal wall ruptured     Prior to Admission medications   Medication Sig Start Date End Date Taking? Authorizing Provider  aspirin EC 81 MG tablet Take 81 mg by mouth daily.   Yes [provider]  atenolol (TENORMIN) 50 MG tablet Take 1 tablet (50 mg total) by mouth 2 (two) times daily. 01/29/23  Yes Sherlene Shams, MD  atorvastatin (LIPITOR) 40 MG tablet Take 1 tablet (40 mg total) by mouth daily. 09/04/22  Yes Sherlene Shams, MD  gabapentin  (NEURONTIN) 100 MG capsule Take 1 capsule (100 mg total) by mouth 3 (three) times daily. 01/29/23  Yes Sherlene Shams, MD  gabapentin (NEURONTIN) 300 MG capsule Take 1 capsule (300 mg total) by mouth 4 (four) times daily. 01/29/23  Yes Sherlene Shams, MD  levothyroxine (SYNTHROID) 100 MCG tablet Take 1 tablet (100 mcg total) by mouth daily. 01/29/23  Yes Sherlene Shams, MD  losartan (COZAAR) 50 MG tablet Take 50 mg by mouth daily. 03/09/23  Yes [provider]  metFORMIN (GLUCOPHAGE) 1000 MG tablet TAKE 1 TABLET (1,000 MG TOTAL) BY MOUTH TWICE A DAY WITH FOOD 01/29/23  Yes Sherlene Shams, MD  omeprazole (PRILOSEC) 20 MG capsule Take 20 mg by mouth daily. 02/14/23  Yes [provider]  traMADol (ULTRAM) 50 MG tablet TAKE 1 TABLET BY MOUTH EVERY 6 (SIX) HOURS AS NEEDED FOR MUSCULOSKELETAL PAIN 12/30/22  Yes Sherlene Shams, MD  vitamin B-12 (CYANOCOBALAMIN) 500 MCG tablet Take 500 mcg by mouth daily.   Yes [provider]  albuterol (VENTOLIN HFA) 108 (90 Base) MCG/ACT inhaler Inhale 2 puffs into the lungs every 6 (six) hours as needed for wheezing. 09/04/22   Sherlene Shams, MD  BD PEN NEEDLE NANO 2ND GEN 32G X 4 MM MISC USE DAILY AS DIRECTED WITH LEVEMIR 06/16/22   Sherlene Shams, MD  glucose blood (ONETOUCH VERIO) test strip Use as instructed to check blood sugars 3 times daily 02/14/21   Sherlene Shams, MD  meloxicam (MOBIC) 7.5 MG tablet TAKE 1 TABLET BY MOUTH EVERY DAY 09/29/21   Eulis Foster, FNP  OneTouch Delica Lancets 30G MISC 1 application by Does not apply route 2 (two) times daily. Use to check blood sugar up to two times daily. E11.69 04/18/19   Sherlene Shams, MD  Semaglutide,0.25 or 0.5MG /DOS, (OZEMPIC, 0.25 OR 0.5 MG/DOSE,) 2 MG/3ML SOPN Inject 0.5 mg into the skin once a week. 09/28/22   Sherlene Shams, MD  temazepam (RESTORIL) 7.5 MG capsule Take 1 capsule (7.5 mg total) by mouth at bedtime as needed for sleep. 04/24/23   Sherlene Shams, MD    Allergies as  of 03/31/2023 - Review Complete 03/30/2023  Allergen Reaction Noted   Vicodin [hydrocodone-acetaminophen] Other (See Comments) 09/08/2011   Acetaminophen  11/12/2020   Vicodin hp [hydrocodone-acetaminophen]  08/07/2020    Family History  Problem Relation Age of Onset   COPD Mother        Emphysema   Diabetes Mother    Other Mother        Died in house fire at age 96.   Heart disease Father    Seizures Father    Cancer Father        Lung cancer with brain metastasis   Cancer Brother    Heart disease Brother        valve replacement  COPD Brother    Asthma Brother    COPD Son 7   Diabetes Maternal Grandmother    Cancer Other        aunt ? maternal vs paternal side lung cancer    Anesthesia problems Neg Hx    Hypotension Neg Hx    Malignant hyperthermia Neg Hx    Pseudochol deficiency Neg Hx    Thyroid disease Neg Hx    Breast cancer Neg Hx     Social History   Socioeconomic History   Marital status: Widowed    Spouse name: Not on file   Number of children: 2   Years of education: 46   Highest education level: 12th grade  Occupational History   Occupation: Retired  Tobacco Use   Smoking status: Former    Current packs/day: 0.00    Average packs/day: 0.3 packs/day for 20.0 years (5.0 ttl pk-yrs)    Types: Cigarettes    Start date: 56    Quit date: 2018    Years since quitting: 6.8   Smokeless tobacco: Never   Tobacco comments:    Pt quit 11/08/2014.Pt had quit smoking for 13 years before starting back in 2010; per 05/2017 visit quit smoking 2017 smoked total 25 years 1 ppd   Vaping Use   Vaping status: Never Used  Substance and Sexual Activity   Alcohol use: No    Alcohol/week: 0.0 standard drinks of alcohol   Drug use: No   Sexual activity: Never    Birth control/protection: Surgical  Other Topics Concern   Not on file  Social History Narrative   Widowed.    Lives alone.   Right-handed.    2 cups caffeine per day.   Social Determinants of Health    Financial Resource Strain: Low Risk  (09/24/2022)   Overall Financial Resource Strain (CARDIA)    Difficulty of Paying Living Expenses: Not hard at all  Food Insecurity: No Food Insecurity (09/24/2022)   Hunger Vital Sign    Worried About Running Out of Food in the Last Year: Never true    Ran Out of Food in the Last Year: Never true  Transportation Needs: No Transportation Needs (09/24/2022)   PRAPARE - Administrator, Civil Service (Medical): No    Lack of Transportation (Non-Medical): No  Physical Activity: Inactive (09/24/2022)   Exercise Vital Sign    Days of Exercise per Week: 0 days    Minutes of Exercise per Session: 0 min  Stress: No Stress Concern Present (09/24/2022)   [employer] of Occupational Health - Occupational Stress Questionnaire    Feeling of Stress : Only a little  Social Connections: Unknown (09/24/2022)   Social Connection and Isolation Panel [NHANES]    Frequency of Communication with Friends and Family: Three times a week    Frequency of Social Gatherings with Friends and Family: Once a week    Attends Religious Services: Patient declined    Database administrator or Organizations: No    Attends Banker Meetings: Never    Marital Status: Widowed  Intimate Partner Violence: Not At Risk (09/14/2022)   Humiliation, Afraid, Rape, and Kick questionnaire    Fear of Current or Ex-Partner: No    Emotionally Abused: No    Physically Abused: No    Sexually Abused: No    Review of Systems: See HPI, otherwise negative ROS  Physical Exam: BP (!) 153/68   Pulse 70   Temp (!) 97.2 F (36.2  C) (Temporal)   Resp 16   Ht 5\' 4"  (1.626 m)   Wt 64.9 kg   SpO2 100%   BMI 24.55 kg/m  General:   Alert,  pleasant and cooperative in NAD Head:  Normocephalic and atraumatic. Neck:  Supple; no masses or thyromegaly. Lungs:  Clear throughout to auscultation, normal respiratory effort.    Heart:  +S1, +S2, Regular rate and rhythm, No  edema. Abdomen:  Soft, nontender and nondistended. Normal bowel sounds, without guarding, and without rebound.   Neurologic:  Alert and  oriented x4;  grossly normal neurologically.  Impression/Plan: Maria Hinton is here for an endoscopy and colonoscopy  to be performed for  evaluation of dysphagia and colon cancer screening     Risks, benefits, limitations, and alternatives regarding endoscopy have been reviewed with the patient.  Questions have been answered.  All parties agreeable.   [name], MD  05/19/2023, 7:45 AM

## 2023-05-20 ENCOUNTER — Encounter: Payer: Self-pay | Admitting: Gastroenterology

## 2023-05-20 LAB — SURGICAL PATHOLOGY

## 2023-05-21 NOTE — Telephone Encounter (Signed)
PAP: Patient assistance application for Ozempic, Novolog, and Candie Mile has been approved by PAP Companies: [employer] from 07/07/2023 to 07/05/2024. Medication should be delivered to PAP Delivery: Provider's office For further shipping updates, please contact [employer] at [phone] Pt ID is: Waiting on approval letter

## 2023-05-24 ENCOUNTER — Encounter: Payer: Self-pay | Admitting: Gastroenterology

## 2023-05-31 ENCOUNTER — Encounter: Payer: Self-pay | Admitting: Gastroenterology

## 2023-05-31 ENCOUNTER — Ambulatory Visit: Payer: Medicare HMO | Admitting: Gastroenterology

## 2023-05-31 VITALS — BP 155/80 | HR 86 | Temp 98.0°F | Wt 142.0 lb

## 2023-05-31 DIAGNOSIS — R1310 Dysphagia, unspecified: Secondary | ICD-10-CM

## 2023-05-31 DIAGNOSIS — R1319 Other dysphagia: Secondary | ICD-10-CM

## 2023-05-31 NOTE — Progress Notes (Signed)
[name] MD, MRCP(U.K) [address]  [address]  [place], [place] [address]  Main: [phone]  Fax: [phone]   Primary Care Physician: Sherlene Shams, MD  Primary Gastroenterologist:  Dr. [name]   Chief Complaint  Patient presents with   Follow-up    HPI: Maria Hinton is a 77 y.o. female   Summary of history :  Initially referred and seen on 03/30/2023 for dysphagia.  She says she has had difficulty swallowing for a few months.  Affect solids and liquids.  Points to her neck as a site where the foods do not go down and sometimes comes back up.  At times is associated with coughing.  Denies having a dry mouth.  Does take tramadol on a regular basis.  Also on semaglutide to lose weight.  She also takes omeprazole for acid reflux.  No unintentional weight loss.   2019 : EGD: Biopsies showed no eosinophils.  01/11/2023: Barium swallow : Moderate esophageal dysmotility , trace vestibular penetration.   Interval history   03/30/2023-05/31/2023  05/19/2023: EGD: normal : bx of the esophagus: features of reflux disease . Colonoscopy :decreased anal tone , 2 polyps -  TA x 2  04/12/2023: Modified barium swallow: " Age-related swallowing changes appreciated (e.g. delayed swallow initiation) as well as trace-mild oral and pharyngeal stasis which increased with viscocity and reduced with spontaneous secondary swallows/prompted liquid wash and during the swallow transient aspiration of thin liquids during sequential sips. Above mentioned deficits likely due to reduced base of tongue retraction and reduced laryngeal vestibule closure. Globus sensatoin was not reproduced during today's evaluation; however, retention of barium appreciated prior to esophageal sweep. Pt also with intermittent throat clearing which did not necessarily correlate with pharyngeal swallow function.   Recommend continuation of a regular diet with thin liquids with standard aspiration precautions/safe  swallowing strategies and reflux precautions. No further SLP services warranted at this time. Recommend pt to continue to f/u with GI for further assessment. "  Doing well since last time following recommendations of following therapy assessment.  It has helped greatly.  She has stopped tramadol.  No other new complaints.  Has hemorrhoids since the time of her children's birth no new changes.  Current Outpatient Medications  Medication Sig Dispense Refill   albuterol (VENTOLIN HFA) 108 (90 Base) MCG/ACT inhaler Inhale 2 puffs into the lungs every 6 (six) hours as needed for wheezing. 6.7 g 11   aspirin EC 81 MG tablet Take 81 mg by mouth daily.     atenolol (TENORMIN) 50 MG tablet Take 1 tablet (50 mg total) by mouth 2 (two) times daily. 180 tablet 1   atorvastatin (LIPITOR) 40 MG tablet Take 1 tablet (40 mg total) by mouth daily. 90 tablet 3   BD PEN NEEDLE NANO 2ND GEN 32G X 4 MM MISC USE DAILY AS DIRECTED WITH LEVEMIR 100 each 1   gabapentin (NEURONTIN) 100 MG capsule Take 1 capsule (100 mg total) by mouth 3 (three) times daily. 90 capsule 3   gabapentin (NEURONTIN) 300 MG capsule Take 1 capsule (300 mg total) by mouth 4 (four) times daily. 120 capsule 1   glucose blood (ONETOUCH VERIO) test strip Use as instructed to check blood sugars 3 times daily 100 strip 12   levothyroxine (SYNTHROID) 100 MCG tablet Take 1 tablet (100 mcg total) by mouth daily. 90 tablet 3   losartan (COZAAR) 50 MG tablet Take 50 mg by mouth daily.     meloxicam (  MOBIC) 7.5 MG tablet TAKE 1 TABLET BY MOUTH EVERY DAY 30 tablet 2   metFORMIN (GLUCOPHAGE) 1000 MG tablet TAKE 1 TABLET (1,000 MG TOTAL) BY MOUTH TWICE A DAY WITH FOOD 180 tablet 1   omeprazole (PRILOSEC) 20 MG capsule Take 20 mg by mouth daily.     OneTouch Delica Lancets 30G MISC 1 application by Does not apply route 2 (two) times daily. Use to check blood sugar up to two times daily. E11.69 100 each 11   Semaglutide,0.25 or 0.5MG /DOS, (OZEMPIC, 0.25 OR 0.5  MG/DOSE,) 2 MG/3ML SOPN Inject 0.5 mg into the skin once a week. 3 mL 2   temazepam (RESTORIL) 7.5 MG capsule Take 1 capsule (7.5 mg total) by mouth at bedtime as needed for sleep. 30 capsule 5   traMADol (ULTRAM) 50 MG tablet TAKE 1 TABLET BY MOUTH EVERY 6 (SIX) HOURS AS NEEDED FOR MUSCULOSKELETAL PAIN 90 tablet 2   vitamin B-12 (CYANOCOBALAMIN) 500 MCG tablet Take 500 mcg by mouth daily.     Current Facility-Administered Medications  Medication Dose Route Frequency Provider Last Rate Last Admin   ipratropium (ATROVENT) nebulizer solution 0.5 mg  0.5 mg Nebulization Once Kara Dies, NP        Allergies as of 05/31/2023 - Review Complete 05/31/2023  Allergen Reaction Noted   Vicodin [hydrocodone-acetaminophen] Other (See Comments) 09/08/2011   Acetaminophen  11/12/2020   Vicodin hp [hydrocodone-acetaminophen]  08/07/2020       ROS:  General: Negative for anorexia, weight loss, fever, chills, fatigue, weakness. ENT: Negative for hoarseness, difficulty swallowing , nasal congestion. CV: Negative for chest pain, angina, palpitations, dyspnea on exertion, peripheral edema.  Respiratory: Negative for dyspnea at rest, dyspnea on exertion, cough, sputum, wheezing.  GI: See history of present illness. GU:  Negative for dysuria, hematuria, urinary incontinence, urinary frequency, nocturnal urination.  Endo: Negative for unusual weight change.    Physical Examination:   BP (!) 155/80 (BP Location: Left Arm, Patient Position: Sitting, Cuff Size: Normal)   Pulse 86   Temp 98 F (36.7 C) (Oral)   Wt 142 lb (64.4 kg)   BMI 24.37 kg/m   General: Well-nourished, well-developed in no acute distress.  Eyes: No icterus. Conjunctivae pink. Mouth: Oropharyngeal mucosa moist and pink , no lesions erythema or exudate. Neuro: Alert and oriented x 3.  Grossly intact. Skin: Warm and dry, no jaundice.   Psych: Alert and cooperative, normal mood and affect.   Imaging Studies: No results  found.  Assessment and Plan:   Maria Hinton is a 77 y.o. y/o female  here for dysphagia.  Endoscopy showed no abnormality modified barium swallow showed some elements of transfer dysphagia with no further intervention suggested by swallowing therapist patient has been advised of certain maneuvers to perform after meals such as eating slowly chewing her food which she has already been following and has helped her immensely.    Dr [name]  MD,MRCP [facility]) Follow up in as needed

## 2023-06-15 ENCOUNTER — Ambulatory Visit: Payer: Medicare HMO | Admitting: Internal Medicine

## 2023-06-15 ENCOUNTER — Encounter: Payer: Self-pay | Admitting: Internal Medicine

## 2023-06-15 VITALS — BP 150/84 | HR 90 | Ht 64.0 in | Wt 147.0 lb

## 2023-06-15 DIAGNOSIS — N1832 Chronic kidney disease, stage 3b: Secondary | ICD-10-CM

## 2023-06-15 DIAGNOSIS — I10 Essential (primary) hypertension: Secondary | ICD-10-CM | POA: Diagnosis not present

## 2023-06-15 DIAGNOSIS — E785 Hyperlipidemia, unspecified: Secondary | ICD-10-CM

## 2023-06-15 DIAGNOSIS — E1169 Type 2 diabetes mellitus with other specified complication: Secondary | ICD-10-CM

## 2023-06-15 DIAGNOSIS — E1140 Type 2 diabetes mellitus with diabetic neuropathy, unspecified: Secondary | ICD-10-CM

## 2023-06-15 DIAGNOSIS — Z794 Long term (current) use of insulin: Secondary | ICD-10-CM | POA: Diagnosis not present

## 2023-06-15 MED ORDER — METFORMIN HCL 1000 MG PO TABS: ORAL_TABLET | ORAL | 1 refills | Status: DC

## 2023-06-15 MED ORDER — LOSARTAN POTASSIUM 100 MG PO TABS: 100.0000 mg | ORAL_TABLET | Freq: Every day | ORAL | 1 refills | Status: DC

## 2023-06-15 MED ORDER — LEVOTHYROXINE SODIUM 100 MCG PO TABS: 100.0000 ug | ORAL_TABLET | Freq: Every day | ORAL | 3 refills | Status: DC

## 2023-06-15 MED ORDER — ATENOLOL 50 MG PO TABS: 50.0000 mg | ORAL_TABLET | Freq: Two times a day (BID) | ORAL | 1 refills | Status: DC

## 2023-06-15 MED ORDER — GABAPENTIN 300 MG PO CAPS: 300.0000 mg | ORAL_CAPSULE | Freq: Four times a day (QID) | ORAL | 1 refills | Status: DC

## 2023-06-15 NOTE — Assessment & Plan Note (Signed)
Improved control with ozempic and metformin.  No changes today. Recommend adding mild at bedtime to prevent recurrent hypoglycemia

## 2023-06-15 NOTE — Assessment & Plan Note (Signed)
Not at goal.  Increase losartan to 100 mg daily , continue atenolo 50 mg bid

## 2023-06-15 NOTE — Progress Notes (Signed)
Subjective:  Patient ID: Maria Hinton, female    DOB: 1946-05-19  Age: 77 y.o. MRN: [id number]  CC: The primary encounter diagnosis was HTN (hypertension), benign. Diagnoses of Hyperlipidemia associated with type 2 diabetes mellitus (HCC), Type 2 diabetes mellitus with diabetic neuropathy, with long-term current use of insulin (HCC), and Stage 3b chronic kidney disease (HCC) were also pertinent to this visit.   HPI Maria Hinton presents for  Chief Complaint  Patient presents with  . Medical Management of Chronic Issues    Follow up on diabetes    1) Type  2 DM:   She  feels generally well,   walking her dog for exercise but not trying to lose weight. .  Denies any recent hypoglyemic events.  Taking metformin 1000 mg bid and ozempic 0.5 mg weekly  .   Following a carbohydrate modified diet 6 days per week. Denies numbness, burning and tingling of extremities. Appetite is not diminished.  She has had no net weight loss in the last 6 months .  I have downloaded and reviewed the data from patient's Freestyle continuous blood glucose monitor. Patient's  sugars have been  IN RANGE 89    % OF THE TIME,   BELOW RANGE    2 % of the time.  And ABOVE RANGE   9% OF THE TIME .  However review of daily blood sugar readings note frequent lows in the 70s occurring in early morning (4 am to 7 a m)        Outpatient Medications Prior to Visit  Medication Sig Dispense Refill  . albuterol (VENTOLIN HFA) 108 (90 Base) MCG/ACT inhaler Inhale 2 puffs into the lungs every 6 (six) hours as needed for wheezing. 6.7 g 11  . aspirin EC 81 MG tablet Take 81 mg by mouth daily.    Marland Kitchen atorvastatin (LIPITOR) 40 MG tablet Take 1 tablet (40 mg total) by mouth daily. 90 tablet 3  . BD PEN NEEDLE NANO 2ND GEN 32G X 4 MM MISC USE DAILY AS DIRECTED WITH LEVEMIR 100 each 1  . gabapentin (NEURONTIN) 100 MG capsule Take 1 capsule (100 mg total) by mouth 3 (three) times daily. 90 capsule 3  . glucose blood (ONETOUCH VERIO)  test strip Use as instructed to check blood sugars 3 times daily 100 strip 12  . meloxicam (MOBIC) 7.5 MG tablet TAKE 1 TABLET BY MOUTH EVERY DAY 30 tablet 2  . omeprazole (PRILOSEC) 20 MG capsule Take 20 mg by mouth daily.    Letta Pate Delica Lancets 30G MISC 1 application by Does not apply route 2 (two) times daily. Use to check blood sugar up to two times daily. E11.69 100 each 11  . Semaglutide,0.25 or 0.5MG /DOS, (OZEMPIC, 0.25 OR 0.5 MG/DOSE,) 2 MG/3ML SOPN Inject 0.5 mg into the skin once a week. 3 mL 2  . temazepam (RESTORIL) 7.5 MG capsule Take 1 capsule (7.5 mg total) by mouth at bedtime as needed for sleep. 30 capsule 5  . traMADol (ULTRAM) 50 MG tablet TAKE 1 TABLET BY MOUTH EVERY 6 (SIX) HOURS AS NEEDED FOR MUSCULOSKELETAL PAIN 90 tablet 2  . vitamin B-12 (CYANOCOBALAMIN) 500 MCG tablet Take 500 mcg by mouth daily.    Marland Kitchen atenolol (TENORMIN) 50 MG tablet Take 1 tablet (50 mg total) by mouth 2 (two) times daily. 180 tablet 1  . gabapentin (NEURONTIN) 300 MG capsule Take 1 capsule (300 mg total) by mouth 4 (four) times daily. 120 capsule 1  .  levothyroxine (SYNTHROID) 100 MCG tablet Take 1 tablet (100 mcg total) by mouth daily. 90 tablet 3  . losartan (COZAAR) 50 MG tablet Take 100 mg by mouth daily.    . metFORMIN (GLUCOPHAGE) 1000 MG tablet TAKE 1 TABLET (1,000 MG TOTAL) BY MOUTH TWICE A DAY WITH FOOD 180 tablet 1   Facility-Administered Medications Prior to Visit  Medication Dose Route Frequency Provider Last Rate Last Admin  . ipratropium (ATROVENT) nebulizer solution 0.5 mg  0.5 mg Nebulization Once Kara Dies, NP        Review of Systems;  Patient denies headache, fevers, malaise, unintentional weight loss, skin rash, eye pain, sinus congestion and sinus pain, sore throat, dysphagia,  hemoptysis , cough, dyspnea, wheezing, chest pain, palpitations, orthopnea, edema, abdominal pain, nausea, melena, diarrhea, constipation, flank pain, dysuria, hematuria, urinary  Frequency,  nocturia, numbness, tingling, seizures,  Focal weakness, Loss of consciousness,  Tremor, insomnia, depression, anxiety, and suicidal ideation.      Objective:  BP (!) 150/84   Pulse 90   Ht 5\' 4"  (1.626 m)   Wt 147 lb (66.7 kg)   SpO2 97%   BMI 25.23 kg/m   BP Readings from Last 3 Encounters:  06/15/23 (!) 150/84  05/31/23 (!) 155/80  05/19/23 (!) 119/53    Wt Readings from Last 3 Encounters:  06/15/23 147 lb (66.7 kg)  05/31/23 142 lb (64.4 kg)  05/19/23 143 lb (64.9 kg)    Physical Exam Vitals reviewed.  Constitutional:      General: She is not in acute distress.    Appearance: Normal appearance. She is normal weight. She is not ill-appearing, toxic-appearing or diaphoretic.  HENT:     Head: Normocephalic.  Eyes:     General: No scleral icterus.       Right eye: No discharge.        Left eye: No discharge.     Conjunctiva/sclera: Conjunctivae normal.  Cardiovascular:     Rate and Rhythm: Normal rate and regular rhythm.     Heart sounds: Normal heart sounds.  Pulmonary:     Effort: Pulmonary effort is normal. No respiratory distress.     Breath sounds: Normal breath sounds.  Musculoskeletal:        General: Normal range of motion.  Skin:    General: Skin is warm and dry.  Neurological:     General: No focal deficit present.     Mental Status: She is alert and oriented to person, place, and time. Mental status is at baseline.  Psychiatric:        Mood and Affect: Mood normal.        Behavior: Behavior normal.        Thought Content: Thought content normal.        Judgment: Judgment normal.   Lab Results  Component Value Date   HGBA1C 6.0 03/16/2023   HGBA1C 7.0 (H) 12/14/2022   HGBA1C 8.0 (H) 09/04/2022    Lab Results  Component Value Date   CREATININE 1.03 03/16/2023   CREATININE 1.30 (H) 01/29/2023   CREATININE 1.21 (H) 12/14/2022    Lab Results  Component Value Date   WBC 10.6 (H) 09/04/2022   HGB 12.1 09/04/2022   HCT 36.7 09/04/2022    PLT 365.0 09/04/2022   GLUCOSE 90 03/16/2023   CHOL 124 03/16/2023   TRIG 148.0 03/16/2023   HDL 45.20 03/16/2023   LDLDIRECT 73.0 03/16/2023   LDLCALC 49 03/16/2023   ALT 10 03/16/2023   AST 14  03/16/2023   NA 141 03/16/2023   K 4.3 03/16/2023   CL 101 03/16/2023   CREATININE 1.03 03/16/2023   BUN 18 03/16/2023   CO2 27 03/16/2023   TSH 3.49 12/14/2022   INR 0.88 02/17/2018   HGBA1C 6.0 03/16/2023   MICROALBUR 1.0 03/22/2023    No results found.  Assessment & Plan:  .HTN (hypertension), benign Assessment & Plan: Not at goal.  Increase losartan to 100 mg daily , continue atenolo 50 mg bid   Orders: -     Comprehensive metabolic panel  Hyperlipidemia associated with type 2 diabetes mellitus (HCC) -     Lipid panel -     LDL cholesterol, direct  Type 2 diabetes mellitus with diabetic neuropathy, with long-term current use of insulin (HCC) Assessment & Plan: Improved control with ozempic and metformin.  No changes today. Recommend adding mild at bedtime to prevent recurrent hypoglycemia   Orders: -     Hemoglobin A1c -     Comprehensive metabolic panel  Stage 3b chronic kidney disease (HCC) Assessment & Plan: GFR  is < 50 ml/min but stable.  She has been referred to Nephrology  Lab Results  Component Value Date   CREATININE 1.03 03/16/2023     Lab Results  Component Value Date   LABMICR 25.6 02/14/2021   MICROALBUR 1.0 03/22/2023   MICROALBUR 4.0 03/20/2022     Lab Results  Component Value Date   CREATININE 1.03 03/16/2023      Other orders -     Atenolol; Take 1 tablet (50 mg total) by mouth 2 (two) times daily.  Dispense: 180 tablet; Refill: 1 -     Gabapentin; Take 1 capsule (300 mg total) by mouth 4 (four) times daily.  Dispense: 120 capsule; Refill: 1 -     metFORMIN HCl; TAKE 1 TABLET (1,000 MG TOTAL) BY MOUTH TWICE A DAY WITH FOOD  Dispense: 180 tablet; Refill: 1 -     Levothyroxine Sodium; Take 1 tablet (100 mcg total) by mouth daily.   Dispense: 90 tablet; Refill: 3 -     Losartan Potassium; Take 1 tablet (100 mg total) by mouth daily.  Dispense: 90 tablet; Refill: 1     I provided 30 minutes of face-to-face time during this encounter reviewing patient's last visit with me, patient's  most recent visit with cardiology,  nephrology,  and neurology,  recent surgical and non surgical procedures, previous  labs and imaging studies, counseling on currently addressed issues,  and post visit ordering to diagnostics and therapeutics .   Follow-up: Return in about 6 months (around 12/14/2023) for follow up diabetes.   Sherlene Shams, MD

## 2023-06-15 NOTE — Patient Instructions (Addendum)
Try reducing the morning dose of gabapentin to  300 and taking 400 mg after dinner    You are having low sugars in the wee hours of morning.  Try drinking a glass of milks at bedtime   Review your log the next morning  to see f you dropped blow 80 or spike  above 150    The new goals for optimal blood pressure management are 120/70 to 130/80  your blood pressure is too high.  Please increase the losartan dose to 100 mg daily  .    Please monitor  your blood pressure once a week and and send me the readings so I can determine if you need a change in medication

## 2023-06-15 NOTE — Assessment & Plan Note (Signed)
GFR  is < 50 ml/min but stable.  She has been referred to Nephrology  Lab Results  Component Value Date   CREATININE 1.03 03/16/2023     Lab Results  Component Value Date   LABMICR 25.6 02/14/2021   MICROALBUR 1.0 03/22/2023   MICROALBUR 4.0 03/20/2022     Lab Results  Component Value Date   CREATININE 1.03 03/16/2023

## 2023-06-16 LAB — COMPREHENSIVE METABOLIC PANEL
ALT: 10 U/L (ref 0–35)
AST: 16 U/L (ref 0–37)
Albumin: 4.1 g/dL (ref 3.5–5.2)
Alkaline Phosphatase: 113 U/L (ref 39–117)
BUN: 24 mg/dL — ABNORMAL HIGH (ref 6–23)
CO2: 32 meq/L (ref 19–32)
Calcium: 9.1 mg/dL (ref 8.4–10.5)
Chloride: 103 meq/L (ref 96–112)
Creatinine, Ser: 1.01 mg/dL (ref 0.40–1.20)
GFR: 54.52 mL/min — ABNORMAL LOW (ref >60.00)
Glucose, Bld: 149 mg/dL — ABNORMAL HIGH (ref 70–99)
Potassium: 4.1 meq/L (ref 3.5–5.1)
Sodium: 143 meq/L (ref 135–145)
Total Bilirubin: 0.3 mg/dL (ref 0.2–1.2)
Total Protein: 7.1 g/dL (ref 6.0–8.3)

## 2023-06-16 LAB — HEMOGLOBIN A1C: Hgb A1c MFr Bld: 6.7 % — ABNORMAL HIGH (ref 4.6–6.5)

## 2023-06-16 LAB — LIPID PANEL
Cholesterol: 185 mg/dL (ref 0–200)
HDL: 50.4 mg/dL (ref >39.00)
LDL Cholesterol: 62 mg/dL (ref 0–99)
NonHDL: 134.59
Total CHOL/HDL Ratio: 4
Triglycerides: 363 mg/dL — ABNORMAL HIGH (ref 0.0–149.0)
VLDL: 72.6 mg/dL — ABNORMAL HIGH (ref 0.0–40.0)

## 2023-06-16 LAB — LDL CHOLESTEROL, DIRECT: Direct LDL: 89 mg/dL

## 2023-06-17 ENCOUNTER — Other Ambulatory Visit: Payer: Self-pay | Admitting: Internal Medicine

## 2023-06-17 NOTE — Telephone Encounter (Signed)
Requesting: Tramadol Contract: No UDS: No Last Visit: 06/15/2023 Next Visit: 09/13/2023 Last Refill: 12/30/2022  Please Advise

## 2023-06-17 NOTE — Addendum Note (Signed)
Addended by: Sherlene Shams on: 06/17/2023 08:44 AM   Modules accepted: Level of Service

## 2023-09-13 ENCOUNTER — Ambulatory Visit: Payer: Medicare HMO | Admitting: Internal Medicine

## 2023-09-14 ENCOUNTER — Ambulatory Visit: Payer: Medicare HMO | Admitting: Internal Medicine

## 2023-09-15 ENCOUNTER — Ambulatory Visit: Payer: Medicare HMO | Admitting: *Deleted

## 2023-09-15 ENCOUNTER — Other Ambulatory Visit: Payer: Self-pay | Admitting: Internal Medicine

## 2023-09-15 VITALS — Ht 64.0 in | Wt 144.0 lb

## 2023-09-15 DIAGNOSIS — Z0000 Encounter for general adult medical examination without abnormal findings: Secondary | ICD-10-CM | POA: Diagnosis not present

## 2023-09-15 NOTE — Progress Notes (Signed)
 Subjective:   Maria Hinton is a 78 y.o. who presents for a Medicare Wellness preventive visit.  Visit Complete: Virtual I connected with  Maria Hinton on 09/15/23 by a audio enabled telemedicine application and verified that I am speaking with the correct person using two identifiers.  Patient Location: Home  Provider Location: Office/Clinic  I discussed the limitations of evaluation and management by telemedicine. The patient expressed understanding and agreed to proceed.  Vital Signs: Because this visit was a virtual/telehealth visit, some criteria may be missing or patient reported. Any vitals not documented were not able to be obtained and vitals that have been documented are patient reported.  VideoDeclined- This patient declined Librarian, academic. Therefore the visit was completed with audio only.  AWV Questionnaire: No: Patient Medicare AWV questionnaire was not completed prior to this visit.  Cardiac Risk Factors include: advanced age (>67men, >22 women);diabetes mellitus;dyslipidemia;hypertension     Objective:    Today's Vitals   09/15/23 1124 09/15/23 1125  Weight: 144 lb (65.3 kg)   Height: 5\' 4"  (1.626 m)   PainSc:  5    Body mass index is 24.72 kg/m.     09/15/2023   11:41 AM 05/19/2023    7:15 AM 09/14/2022    1:11 PM 04/14/2022    1:16 PM 10/27/2021    2:36 PM 09/05/2021   10:01 AM 09/04/2020    9:43 AM  Advanced Directives  Does Patient Have a Medical Advance Directive? Yes Yes Yes No Yes Yes Yes  Type of Estate agent of [place];Living will Healthcare Power of [place];Living will Healthcare Power of [place];Living will  Healthcare Power of [place];Living will Healthcare Power of [place];Living will Healthcare Power of [place];Living will  Does patient want to make changes to medical advance directive?   No - Patient declined  No - Patient declined No - Patient declined No - Patient declined  Copy  of Healthcare Power of Attorney in Chart? No - copy requested  No - copy requested  No - copy requested No - copy requested No - copy requested    Current Medications (verified) Outpatient Encounter Medications as of 09/15/2023  Medication Sig   albuterol (VENTOLIN HFA) 108 (90 Base) MCG/ACT inhaler Inhale 2 puffs into the lungs every 6 (six) hours as needed for wheezing.   aspirin EC 81 MG tablet Take 81 mg by mouth daily.   atenolol (TENORMIN) 50 MG tablet Take 1 tablet (50 mg total) by mouth 2 (two) times daily.   atorvastatin (LIPITOR) 40 MG tablet TAKE 1 TABLET BY MOUTH EVERY DAY   BD PEN NEEDLE NANO 2ND GEN 32G X 4 MM MISC USE DAILY AS DIRECTED WITH LEVEMIR   diphenhydrAMINE (SOMINEX) 25 MG tablet Take 25 mg by mouth at bedtime as needed for sleep.   gabapentin (NEURONTIN) 100 MG capsule Take 1 capsule (100 mg total) by mouth 3 (three) times daily.   gabapentin (NEURONTIN) 300 MG capsule Take 1 capsule (300 mg total) by mouth 4 (four) times daily.   glucose blood (ONETOUCH VERIO) test strip Use as instructed to check blood sugars 3 times daily   levothyroxine (SYNTHROID) 100 MCG tablet Take 1 tablet (100 mcg total) by mouth daily.   losartan (COZAAR) 100 MG tablet Take 1 tablet (100 mg total) by mouth daily.   metFORMIN (GLUCOPHAGE) 1000 MG tablet TAKE 1 TABLET (1,000 MG TOTAL) BY MOUTH TWICE A DAY WITH FOOD   omeprazole (PRILOSEC) 20 MG capsule TAKE  1 CAPSULE BY MOUTH EVERY DAY   OneTouch Delica Lancets 30G MISC 1 application by Does not apply route 2 (two) times daily. Use to check blood sugar up to two times daily. E11.69   Semaglutide,0.25 or 0.5MG /DOS, (OZEMPIC, 0.25 OR 0.5 MG/DOSE,) 2 MG/3ML SOPN Inject 0.5 mg into the skin once a week.   temazepam (RESTORIL) 7.5 MG capsule Take 1 capsule (7.5 mg total) by mouth at bedtime as needed for sleep.   traMADol (ULTRAM) 50 MG tablet Take 1 tablet (50 mg total) by mouth every 6 (six) hours as needed.   vitamin B-12 (CYANOCOBALAMIN) 500 MCG  tablet Take 500 mcg by mouth daily.   meloxicam (MOBIC) 7.5 MG tablet TAKE 1 TABLET BY MOUTH EVERY DAY (Patient not taking: Reported on 09/15/2023)   Facility-Administered Encounter Medications as of 09/15/2023  Medication   ipratropium (ATROVENT) nebulizer solution 0.5 mg    Allergies (verified) Vicodin [hydrocodone-acetaminophen], Acetaminophen, and Vicodin hp [hydrocodone-acetaminophen]   History: Past Medical History:  Diagnosis Date   Actinic keratoses    follows with dermatology    Arthritis    Asthma    Chronic back pain    DDD,herniated disc/spondylosis/radiculopathy   COPD (chronic obstructive pulmonary disease) (HCC)    Diabetes mellitus    1980s   Early cataracts, bilateral    Dr. Jerolyn Center - [facility] [place]   GERD (gastroesophageal reflux disease)    takes Omeprazole daily   Hemorrhoids    History of colonic polyps    colonoscopy 04/2012 - Dr Bluford Kaufmann   Hyperlipidemia    takes Pravastatin daily   Hypertension    takes Metoprolol/HCTZ daily   Hyperthyroidism    Insomnia    Joint pain    fingers   Joint swelling    Pneumonia    x 2 ;in Dec 2010/2011   PONV (postoperative nausea and vomiting)    Right leg weakness    Past Surgical History:  Procedure Laterality Date   ABDOMINAL HYSTERECTOMY     at age 51   BACK SURGERY  20+yrs ago   BIOPSY  05/19/2023   Procedure: BIOPSY;  Surgeon: [name], MD;  Location: [facility] ENDOSCOPY;  Service: Gastroenterology;;   CATARACT EXTRACTION W/PHACO Left 01/04/2019   Procedure: CATARACT EXTRACTION PHACO AND INTRAOCULAR LENS PLACEMENT (IOC)  LEFT;  Surgeon: Lockie Mola, MD;  Location: [facility] SURGERY CNTR;  Service: Ophthalmology;  Laterality: Left;  GIVE IV ZOFRAN   CATARACT EXTRACTION W/PHACO Right 02/01/2019   Procedure: CATARACT EXTRACTION PHACO AND INTRAOCULAR LENS PLACEMENT (IOC)  RIGHT DIABETIC;  Surgeon: Lockie Mola, MD;  Location: [facility]ERY [facility];  Service: Ophthalmology;  Laterality: Right;   DIABETIC GIVE IV ZOFRAN   COLONOSCOPY     COLONOSCOPY N/A 05/13/2015   Procedure: COLONOSCOPY;  Surgeon: Wallace Cullens, MD;  Location: [facility] ENDOSCOPY;  Service: Gastroenterology;  Laterality: N/A;   COLONOSCOPY WITH PROPOFOL N/A 05/10/2020   Procedure: COLONOSCOPY WITH PROPOFOL;  Surgeon: Pasty Spillers, MD;  Location: [facility] ENDOSCOPY;  Service: Endoscopy;  Laterality: N/A;   COLONOSCOPY WITH PROPOFOL N/A 05/19/2023   Procedure: COLONOSCOPY WITH PROPOFOL;  Surgeon: [name], MD;  Location: [facility] ENDOSCOPY;  Service: Gastroenterology;  Laterality: N/A;   ESOPHAGOGASTRODUODENOSCOPY  2011   ESOPHAGOGASTRODUODENOSCOPY (EGD) WITH PROPOFOL N/A 09/02/2017   Procedure: ESOPHAGOGASTRODUODENOSCOPY (EGD) WITH PROPOFOL;  Surgeon: Pasty Spillers, MD;  Location: [facility] ENDOSCOPY;  Service: Endoscopy;  Laterality: N/A;   ESOPHAGOGASTRODUODENOSCOPY (EGD) WITH PROPOFOL N/A 05/19/2023   Procedure: ESOPHAGOGASTRODUODENOSCOPY (EGD) WITH PROPOFOL;  Surgeon: [name],  MD;  Location: [facility] ENDOSCOPY;  Service: Gastroenterology;  Laterality: N/A;   FOOT SURGERY  2011   bunionectomy and knot removed from bottom of both feet   JOINT REPLACEMENT Left 2014   hip  Kraskinski   LUMBAR LAMINECTOMY/DECOMPRESSION MICRODISCECTOMY  09/10/2011   Procedure: LUMBAR LAMINECTOMY/DECOMPRESSION MICRODISCECTOMY 1 LEVEL;  Surgeon: Hewitt Shorts, MD;  Location: [facility] NEURO ORS;  Service: Neurosurgery;  Laterality: Right;  RIGHT Lumbar  Laminotomy and microdiskectomy Lumbar Four-Five   LUMBAR LAMINECTOMY/DECOMPRESSION MICRODISCECTOMY N/A 06/26/2015   Procedure: LUMBAR LAMINECTOMY/DECOMPRESSION MICRODISCECTOMY 1 LEVEL;  Surgeon: Shirlean Kelly, MD;  Location: [facility] NEURO ORS;  Service: Neurosurgery;  Laterality: N/A;  L3 and L4 Laminectomies   MELANOMA EXCISION  2011   on nose/back on neck   POLYPECTOMY  05/19/2023   Procedure: POLYPECTOMY;  Surgeon: [name], MD;  Location: [facility] ENDOSCOPY;  Service: Gastroenterology;;   THORACIC  DISC SURGERY  at age 29 and 4    mass of veins that had gotten into muscle and wrapped around rib;3 ribs also removed from left side   TONSILLECTOMY  as a child   and adenoids    TOTAL HIP ARTHROPLASTY Left 2014   Dr. Martha Clan   TUBAL LIGATION     VAGINA SURGERY     vaginal wall ruptured    Family History  Problem Relation Age of Onset   COPD Mother        Emphysema   Diabetes Mother    Other Mother        Died in house fire at age 63.   Heart disease Father    Seizures Father    Cancer Father        Lung cancer with brain metastasis   Cancer Brother    Heart disease Brother        valve replacement   COPD Brother    Asthma Brother    COPD Son 22   Diabetes Maternal Grandmother    Cancer Other        aunt ? maternal vs paternal side lung cancer    Anesthesia problems Neg Hx    Hypotension Neg Hx    Malignant hyperthermia Neg Hx    Pseudochol deficiency Neg Hx    Thyroid disease Neg Hx    Breast cancer Neg Hx    Social History   Socioeconomic History   Marital status: Widowed    Spouse name: Not on file   Number of children: 2   Years of education: 87   Highest education level: 12th grade  Occupational History   Occupation: Retired  Tobacco Use   Smoking status: Former    Current packs/day: 0.00    Average packs/day: 0.3 packs/day for 20.0 years (5.0 ttl pk-yrs)    Types: Cigarettes    Start date: 51    Quit date: 2018    Years since quitting: 7.1   Smokeless tobacco: Never   Tobacco comments:    Pt quit 11/08/2014.Pt had quit smoking for 13 years before starting back in 2010; per 05/2017 visit quit smoking 2017 smoked total 25 years 1 ppd   Vaping Use   Vaping status: Never Used  Substance and Sexual Activity   Alcohol use: No    Alcohol/week: 0.0 standard drinks of alcohol   Drug use: No   Sexual activity: Never    Birth control/protection: Surgical  Other Topics Concern   Not on file  Social History Narrative   Widowed.    Lives alone.  Right-handed.    2 cups caffeine per day.   Social Drivers of Corporate investment banker Strain: Low Risk  (09/15/2023)   Overall Financial Resource Strain (CARDIA)    Difficulty of Paying Living Expenses: Not hard at all  Food Insecurity: No Food Insecurity (09/15/2023)   Hunger Vital Sign    Worried About Running Out of Food in the Last Year: Never true    Ran Out of Food in the Last Year: Never true  Transportation Needs: No Transportation Needs (09/15/2023)   PRAPARE - Administrator, Civil Service (Medical): No    Lack of Transportation (Non-Medical): No  Physical Activity: Inactive (09/15/2023)   Exercise Vital Sign    Days of Exercise per Week: 0 days    Minutes of Exercise per Session: 0 min  Stress: No Stress Concern Present (09/15/2023)   [employer] of Occupational Health - Occupational Stress Questionnaire    Feeling of Stress : Only a little  Social Connections: Socially Isolated (09/15/2023)   Social Connection and Isolation Panel [NHANES]    Frequency of Communication with Friends and Family: More than three times a week    Frequency of Social Gatherings with Friends and Family: Twice a week    Attends Religious Services: Never    Database administrator or Organizations: No    Attends Banker Meetings: Never    Marital Status: Widowed    Tobacco Counseling Counseling given: Not Answered Tobacco comments: Pt quit 11/08/2014.Pt had quit smoking for 13 years before starting back in 2010; per 05/2017 visit quit smoking 2017 smoked total 25 years 1 ppd     Clinical Intake:  Pre-visit preparation completed: Yes  Pain : 0-10 Pain Score: 5  Pain Type: Chronic pain Pain Location: Back Pain Descriptors / Indicators: Nagging Pain Onset: More than a month ago Pain Frequency: Constant     BMI - recorded: 24.72 Nutritional Status: BMI of 19-24  Normal Nutritional Risks: None Diabetes: Yes CBG done?: No Did pt. bring in CBG monitor from  home?: No  How often do you need to have someone help you when you read instructions, pamphlets, or other written materials from your doctor or pharmacy?: 1 - Never  Interpreter Needed?: No  Information entered by :: R. Elira Colasanti LPN   Activities of Daily Living     09/15/2023   11:27 AM  In your present state of health, do you have any difficulty performing the following activities:  Hearing? 0  Vision? 0  Difficulty concentrating or making decisions? 0  Walking or climbing stairs? 1  Comment climbing stairs  Dressing or bathing? 0  Doing errands, shopping? 0  Preparing Food and eating ? N  Using the Toilet? N  In the past six months, have you accidently leaked urine? Y  Do you have problems with loss of bowel control? Y  Managing your Medications? N  Managing your Finances? N  Housekeeping or managing your Housekeeping? N    Patient Care Team: Sherlene Shams, MD as PCP - General (Internal Medicine) Shirlean Kelly, MD as Consulting Physician (Neurosurgery) Loree Fee, [facility] (Pharmacist)  Indicate any recent Medical Services you may have received from other than [facility] providers in the past year (date may be approximate).     Assessment:   This is a routine wellness examination for Maria Hinton.  Hearing/Vision screen Hearing Screening - Comments:: No issues Vision Screening - Comments:: readers   Goals Addressed  This Visit's Progress    Patient Stated       Wants to walk further than she is able to now       Depression Screen     09/15/2023   11:35 AM 06/15/2023    4:08 PM 03/16/2023    3:14 PM 01/29/2023   10:58 AM 12/14/2022    1:47 PM 10/12/2022    2:01 PM 09/14/2022    1:20 PM  PHQ 2/9 Scores  PHQ - 2 Score 0 0 0 0 0 2 0  PHQ- 9 Score 4     3 0    Fall Risk     09/15/2023   11:29 AM 06/15/2023    4:07 PM 03/16/2023    3:14 PM 01/29/2023   10:58 AM 12/14/2022    1:47 PM  Fall Risk   Falls in the past year? 1 1 1 1 1   Number falls in past  yr: 1 1 1 1 1   Injury with Fall? 0 0 0 0 0  Risk for fall due to : History of fall(s);Impaired balance/gait History of fall(s) History of fall(s) History of fall(s) History of fall(s)  Follow up Falls evaluation completed;Falls prevention discussed Falls evaluation completed Falls evaluation completed Falls evaluation completed Falls evaluation completed    MEDICARE RISK AT HOME:  Medicare Risk at Home Any stairs in or around the home?: No If so, are there any without handrails?: No Home free of loose throw rugs in walkways, pet beds, electrical cords, etc?: Yes Adequate lighting in your home to reduce risk of falls?: Yes Life alert?: No Use of a cane, walker or w/c?: No Grab bars in the bathroom?: No Shower chair or bench in shower?: Yes Elevated toilet seat or a handicapped toilet?: Yes  TIMED UP AND GO:  Was the test performed?  No  Cognitive Function: 6CIT completed    01/30/2016    9:56 AM  MMSE - Mini Mental State Exam  Orientation to time 5  Orientation to Place 5  Registration 3  Attention/ Calculation 5  Recall 3  Language- name 2 objects 2  Language- repeat 1  Language- follow 3 step command 3  Language- read & follow direction 1  Write a sentence 1  Copy design 1  Total score 30        09/15/2023   11:41 AM 09/14/2022    1:19 PM 09/04/2019   10:09 AM 09/02/2018   12:24 PM  6CIT Screen  What Year? 0 points 0 points 0 points 0 points  What month? 0 points 0 points 0 points 0 points  What time? 0 points 0 points 0 points 0 points  Count back from 20 0 points 0 points 0 points 0 points  Months in reverse 0 points 0 points 0 points 0 points  Repeat phrase 0 points 0 points 0 points 0 points  Total Score 0 points 0 points 0 points 0 points    Immunizations Immunization History  Administered Date(s) Administered   Fluad Quad(high Dose 65+) 04/22/2020, 03/20/2022   Fluad Trivalent(High Dose 65+) 03/16/2023   Influenza Split 09/11/2011   Influenza, High Dose  Seasonal PF 06/03/2016, 04/05/2017, 04/11/2018   Influenza,inj,Quad PF,6+ Mos 04/27/2013, 09/17/2014, 04/12/2015   Influenza-Unspecified 04/26/2019, 04/19/2021   Moderna Covid-19 Fall Seasonal Vaccine 80yrs & older 03/31/2023   Moderna SARS-COV2 Booster Vaccination 10/14/2020   PFIZER(Purple Top)SARS-COV-2 Vaccination 09/01/2019, 09/26/2019, 06/04/2020   Pneumococcal Conjugate-13 02/09/2014   Pneumococcal Polysaccharide-23 09/11/2011, 02/25/2018   Tdap 02/09/2014  Screening Tests Health Maintenance  Topic Date Due   Zoster Vaccines- Shingrix (1 of 2) Never done   COVID-19 Vaccine (6 - 2024-25 season) 09/28/2023   MAMMOGRAM  12/07/2023   HEMOGLOBIN A1C  12/14/2023   DTaP/Tdap/Td (2 - Td or Tdap) 02/10/2024   OPHTHALMOLOGY EXAM  02/17/2024   Diabetic kidney evaluation - Urine ACR  03/21/2024   Diabetic kidney evaluation - eGFR measurement  06/14/2024   FOOT EXAM  06/14/2024   Medicare Annual Wellness (AWV)  09/14/2024   Colonoscopy  05/18/2026   Pneumonia Vaccine 37+ Years old  Completed   INFLUENZA VACCINE  Completed   DEXA SCAN  Completed   Hepatitis C Screening  Completed   HPV VACCINES  Aged Out    Health Maintenance  Health Maintenance Due  Topic Date Due   Zoster Vaccines- Shingrix (1 of 2) Never done   Health Maintenance Items Addressed: Patient declines  bone density. Discussed the need to update shingles vaccines.  Additional Screening:  Vision Screening: Recommended annual ophthalmology exams for early detection of glaucoma and other disorders of the eye. Up to date  [facility]  Dental Screening: Recommended annual dental exams for proper oral hygiene  Community Resource Referral / Chronic Care Management: CRR required this visit?  No   CCM required this visit?  No     Plan:     I have personally reviewed and noted the following in the patient's chart:   Medical and social history Use of alcohol, tobacco or illicit drugs  Current medications  and supplements including opioid prescriptions. Patient is currently taking opioid prescriptions. Information provided to patient regarding non-opioid alternatives. Patient advised to discuss non-opioid treatment plan with their provider. Functional ability and status Nutritional status Physical activity Advanced directives List of other physicians Hospitalizations, surgeries, and ER visits in previous 12 months Vitals Screenings to include cognitive, depression, and falls Referrals and appointments  In addition, I have reviewed and discussed with patient certain preventive protocols, quality metrics, and best practice recommendations. A written personalized care plan for preventive services as well as general preventive health recommendations were provided to patient.     Sydell Axon, LPN   1/61/0960   After Visit Summary: (MyChart) Due to this being a telephonic visit, the after visit summary with patients personalized plan was offered to patient via MyChart   Notes: Please refer to Routing Comments.

## 2023-09-15 NOTE — Patient Instructions (Signed)
 Maria Hinton , Thank you for taking time to come for your Medicare Wellness Visit. I appreciate your ongoing commitment to your health goals. Please review the following plan we discussed and let me know if I can assist you in the future.   Referrals/Orders/Follow-Ups/Clinician Recommendations: Consider updating your shingles vaccines.  This is a list of the screening recommended for you and due dates:  Health Maintenance  Topic Date Due   Zoster (Shingles) Vaccine (1 of 2) Never done   COVID-19 Vaccine (6 - 2024-25 season) 09/28/2023   Mammogram  12/07/2023   Hemoglobin A1C  12/14/2023   DTaP/Tdap/Td vaccine (2 - Td or Tdap) 02/10/2024   Eye exam for diabetics  02/17/2024   Yearly kidney health urinalysis for diabetes  03/21/2024   Yearly kidney function blood test for diabetes  06/14/2024   Complete foot exam   06/14/2024   Medicare Annual Wellness Visit  09/14/2024   Colon Cancer Screening  05/18/2026   Pneumonia Vaccine  Completed   Flu Shot  Completed   DEXA scan (bone density measurement)  Completed   Hepatitis C Screening  Completed   HPV Vaccine  Aged Out    Advanced directives: (Copy Requested) Please bring a copy of your health care power of attorney and living will to the office to be added to your chart at your convenience. You can mail to [facility] [address] [place], [place] [address] or email to [email or web address]  Next Medicare Annual Wellness Visit scheduled for next year: Yes 09/20/24 @ 1:00  Managing Pain Without Opioids Opioids are strong medicines used to treat moderate to severe pain. For some people, especially those who have long-term (chronic) pain, opioids may not be the best choice for pain management due to: Side effects like nausea, constipation, and sleepiness. The risk of addiction (opioid use disorder). The longer you take opioids, the greater your risk of addiction. Pain that lasts for more than 3 months is called  chronic pain. Managing chronic pain usually requires more than one approach and is often provided by a team of health care providers working together (multidisciplinary approach). Pain management may be done at a pain management center or pain clinic. How to manage pain without the use of opioids Use non-opioid medicines Non-opioid medicines for pain may include: Over-the-counter or prescription non-steroidal anti-inflammatory drugs (NSAIDs). These may be the first medicines used for pain. They work well for muscle and bone pain, and they reduce swelling. Acetaminophen. This over-the-counter medicine may work well for milder pain but not swelling. Antidepressants. These may be used to treat chronic pain. A certain type of antidepressant (tricyclics) is often used. These medicines are given in lower doses for pain than when used for depression. Anticonvulsants. These are usually used to treat seizures but may also reduce nerve (neuropathic) pain. Muscle relaxants. These relieve pain caused by sudden muscle tightening (spasms). You may also use a pain medicine that is applied to the skin as a patch, cream, or gel (topical analgesic), such as a numbing medicine. These may cause fewer side effects than medicines taken by mouth. Do certain therapies as directed Some therapies can help with pain management. They include: Physical therapy. You will do exercises to gain strength and flexibility. A physical therapist may teach you exercises to move and stretch parts of your body that are weak, stiff, or painful. You can learn these exercises at physical therapy visits and practice them at home. Physical therapy may also involve: Massage.  Heat wraps or applying heat or cold to affected areas. Electrical signals that interrupt pain signals (transcutaneous electrical nerve stimulation, TENS). Weak lasers that reduce pain and swelling (low-level laser therapy). Signals from your body that help you learn to  regulate pain (biofeedback). Occupational therapy. This helps you to learn ways to function at home and work with less pain. Recreational therapy. This involves trying new activities or hobbies, such as a physical activity or drawing. Mental health therapy, including: Cognitive behavioral therapy (CBT). This helps you learn coping skills for dealing with pain. Acceptance and commitment therapy (ACT) to change the way you think and react to pain. Relaxation therapies, including muscle relaxation exercises and mindfulness-based stress reduction. Pain management counseling. This may be individual, family, or group counseling.  Receive medical treatments Medical treatments for pain management include: Nerve block injections. These may include a pain blocker and anti-inflammatory medicines. You may have injections: Near the spine to relieve chronic back or neck pain. Into joints to relieve back or joint pain. Into nerve areas that supply a painful area to relieve body pain. Into muscles (trigger point injections) to relieve some painful muscle conditions. A medical device placed near your spine to help block pain signals and relieve nerve pain or chronic back pain (spinal cord stimulation device). Acupuncture. Follow these instructions at home Medicines Take over-the-counter and prescription medicines only as told by your health care provider. If you are taking pain medicine, ask your health care providers about possible side effects to watch out for. Do not drive or use heavy machinery while taking prescription opioid pain medicine. Lifestyle  Do not use drugs or alcohol to reduce pain. If you drink alcohol, limit how much you have to: 0-1 drink a day for women who are not pregnant. 0-2 drinks a day for men. Know how much alcohol is in a drink. In the [place] one drink equals one 12 oz bottle of beer (355 mL), one 5 oz glass of wine (148 mL), or one 1 oz glass of hard liquor (44 mL). Do not  use any products that contain nicotine or tobacco. These products include cigarettes, chewing tobacco, and vaping devices, such as e-cigarettes. If you need help quitting, ask your health care provider. Eat a healthy diet and maintain a healthy weight. Poor diet and excess weight may make pain worse. Eat foods that are high in fiber. These include fresh fruits and vegetables, whole grains, and beans. Limit foods that are high in fat and processed sugars, such as fried and sweet foods. Exercise regularly. Exercise lowers stress and may help relieve pain. Ask your health care provider what activities and exercises are safe for you. If your health care provider approves, join an exercise class that combines movement and stress reduction. Examples include yoga and tai chi. Get enough sleep. Lack of sleep may make pain worse. Lower stress as much as possible. Practice stress reduction techniques as told by your therapist. General instructions Work with all your pain management providers to find the treatments that work best for you. You are an important member of your pain management team. There are many things you can do to reduce pain on your own. Consider joining an online or in-person support group for people who have chronic pain. Keep all follow-up visits. This is important. Where to find more information You can find more information about managing pain without opioids from: American Academy of Pain Medicine: painmed.org Institute for Chronic Pain: instituteforchronicpain.org American Chronic Pain Association: theacpa.org Contact  a health care provider if: You have side effects from pain medicine. Your pain gets worse or does not get better with treatments or home therapy. You are struggling with anxiety or depression. Summary Many types of pain can be managed without opioids. Chronic pain may respond better to pain management without opioids. Pain is best managed when you and a team of  health care providers work together. Pain management without opioids may include non-opioid medicines, medical treatments, physical therapy, mental health therapy, and lifestyle changes. Tell your health care providers if your pain gets worse or is not being managed well enough. This information is not intended to replace advice given to you by your health care provider. Make sure you discuss any questions you have with your health care provider. Document Revised: 10/02/2020 Document Reviewed: 10/02/2020  Elsevier Patient Education  2024 [employer].

## 2023-10-07 LAB — MICROALBUMIN / CREATININE URINE RATIO
EGFR (Non-African Amer.): >60
Microalb Creat Ratio: <30

## 2023-10-10 ENCOUNTER — Other Ambulatory Visit: Payer: Self-pay | Admitting: Internal Medicine

## 2023-10-22 ENCOUNTER — Encounter: Payer: Self-pay | Admitting: Internal Medicine

## 2023-10-24 ENCOUNTER — Other Ambulatory Visit: Payer: Self-pay | Admitting: Internal Medicine

## 2023-10-25 MED ORDER — ALBUTEROL SULFATE HFA 108 (90 BASE) MCG/ACT IN AERS
2.0000 | INHALATION_SPRAY | Freq: Four times a day (QID) | RESPIRATORY_TRACT | 11 refills | Status: AC | PRN
Start: 2023-10-25 — End: ?

## 2023-12-07 ENCOUNTER — Other Ambulatory Visit: Payer: Self-pay | Admitting: Internal Medicine

## 2023-12-14 ENCOUNTER — Ambulatory Visit: Payer: Medicare HMO | Admitting: Internal Medicine

## 2023-12-14 ENCOUNTER — Other Ambulatory Visit: Payer: Self-pay | Admitting: Internal Medicine

## 2023-12-14 ENCOUNTER — Encounter: Payer: Self-pay | Admitting: Internal Medicine

## 2023-12-14 VITALS — BP 136/66 | HR 70 | Ht 64.0 in | Wt 143.8 lb

## 2023-12-14 DIAGNOSIS — Z1231 Encounter for screening mammogram for malignant neoplasm of breast: Secondary | ICD-10-CM | POA: Diagnosis not present

## 2023-12-14 DIAGNOSIS — E538 Deficiency of other specified B group vitamins: Secondary | ICD-10-CM

## 2023-12-14 DIAGNOSIS — R197 Diarrhea, unspecified: Secondary | ICD-10-CM | POA: Insufficient documentation

## 2023-12-14 DIAGNOSIS — E1169 Type 2 diabetes mellitus with other specified complication: Secondary | ICD-10-CM

## 2023-12-14 DIAGNOSIS — I10 Essential (primary) hypertension: Secondary | ICD-10-CM | POA: Diagnosis not present

## 2023-12-14 DIAGNOSIS — E1140 Type 2 diabetes mellitus with diabetic neuropathy, unspecified: Secondary | ICD-10-CM | POA: Diagnosis not present

## 2023-12-14 DIAGNOSIS — Z794 Long term (current) use of insulin: Secondary | ICD-10-CM

## 2023-12-14 DIAGNOSIS — R748 Abnormal levels of other serum enzymes: Secondary | ICD-10-CM

## 2023-12-14 DIAGNOSIS — E039 Hypothyroidism, unspecified: Secondary | ICD-10-CM

## 2023-12-14 DIAGNOSIS — E785 Hyperlipidemia, unspecified: Secondary | ICD-10-CM

## 2023-12-14 DIAGNOSIS — N1832 Chronic kidney disease, stage 3b: Secondary | ICD-10-CM

## 2023-12-14 LAB — POCT GLYCOSYLATED HEMOGLOBIN (HGB A1C): Hemoglobin A1C: 6.4 % — AB (ref 4.0–5.6)

## 2023-12-14 MED ORDER — OMEPRAZOLE 20 MG PO CPDR: 20.0000 mg | DELAYED_RELEASE_CAPSULE | Freq: Every day | ORAL | 1 refills | Status: DC

## 2023-12-14 MED ORDER — LOSARTAN POTASSIUM 100 MG PO TABS: 100.0000 mg | ORAL_TABLET | Freq: Every day | ORAL | 1 refills | Status: DC

## 2023-12-14 MED ORDER — METFORMIN HCL 1000 MG PO TABS: ORAL_TABLET | ORAL | 1 refills | Status: DC

## 2023-12-14 MED ORDER — ATENOLOL 50 MG PO TABS: 50.0000 mg | ORAL_TABLET | Freq: Two times a day (BID) | ORAL | 1 refills | Status: DC

## 2023-12-14 NOTE — Assessment & Plan Note (Signed)
 TSH NORMALIZED  on 100 MCG DAILY .  Repeat TSH is due .   Lab Results  Component Value Date   TSH 3.49 12/14/2022

## 2023-12-14 NOTE — Patient Instructions (Addendum)
 Your diabetes is under excellent control  so you can stop the metformin   .  If the diarrhea does not resolve in a week ,  [facility] have received    For the neuropathy,   Increase  gabaentin to 500 mg three  times daily

## 2023-12-14 NOTE — Progress Notes (Signed)
 Subjective:  Patient ID: Maria Hinton, female    DOB: 09/17/1945  Age: 78 y.o. MRN: [id number]  CC: The primary encounter diagnosis was Encounter for screening mammogram for malignant neoplasm of breast. Diagnoses of HTN (hypertension), benign, Hyperlipidemia associated with type 2 diabetes mellitus (HCC), Type 2 diabetes mellitus with diabetic neuropathy, with long-term current use of insulin  (HCC), Acquired hypothyroidism, Diarrhea of presumed infectious origin, B12 deficiency, and Stage 3b chronic kidney disease (HCC) were also pertinent to this visit.   HPI Maria Hinton presents for  Chief Complaint  Patient presents with   Medical Management of Chronic Issues    6 month follow up    1) Type 2 DM: not wearing CBG monitor due to lack of receipt  from mail order advance diabetes supply .  Morning sugars by fingerstick 70  fasting,  post prandial 150 to 250  2)Having watery diarrhea for the past  several months  . Episodes have been occurring both  during sleep as well as during waking hours  and last 3-4 days accompanied by lower abdominal cramping.  No blood in stools,  no fevers, diarrhea will resolve  for 2-3 days without no stools during the interlude ,  then recurs .  She has had No solid stools in months .    Has not taken antibiotics in over a year.  Lives alone with her dog who is an inside dog.  No unintentional weight loss . Colonoscopy nov 2024 no polyps      drinks cows milk , not more than 1 cup/week takes metformin   3) Neuropathy takes 400 mg gabapentin  bid . No  change   with recent dose increase   Lab Results  Component Value Date   HGBA1C 6.4 (A) 12/14/2023      Outpatient Medications Prior to Visit  Medication Sig Dispense Refill   albuterol  (VENTOLIN  HFA) 108 (90 Base) MCG/ACT inhaler Inhale 2 puffs into the lungs every 6 (six) hours as needed for wheezing. 6.7 g 11   aspirin EC 81 MG tablet Take 81 mg by mouth daily.     atorvastatin  (LIPITOR) 40 MG tablet  TAKE 1 TABLET BY MOUTH EVERY DAY 90 tablet 1   BD PEN NEEDLE NANO 2ND GEN 32G X 4 MM MISC USE DAILY AS DIRECTED WITH LEVEMIR  100 each 1   gabapentin  (NEURONTIN ) 100 MG capsule TAKE 1 CAPSULE (100 MG TOTAL) BY MOUTH THREE TIMES DAILY. 90 capsule 0   gabapentin  (NEURONTIN ) 300 MG capsule TAKE 1 CAPSULE BY MOUTH 4 TIMES DAILY. 120 capsule 1   glucose blood (ONETOUCH VERIO) test strip Use as instructed to check blood sugars 3 times daily 100 strip 12   levothyroxine  (SYNTHROID ) 100 MCG tablet Take 1 tablet (100 mcg total) by mouth daily. 90 tablet 3   OneTouch Delica Lancets 30G MISC 1 application by Does not apply route 2 (two) times daily. Use to check blood sugar up to two times daily. E11.69 100 each 11   Semaglutide ,0.25 or 0.5MG /DOS, (OZEMPIC , 0.25 OR 0.5 MG/DOSE,) 2 MG/3ML SOPN Inject 0.5 mg into the skin once a week. 3 mL 2   temazepam  (RESTORIL ) 7.5 MG capsule TAKE 1 CAPSULE (7.5 MG TOTAL) BY MOUTH EVERY DAY AT BEDTIME AS NEEDED FOR SLEEP 30 capsule 2   traMADol  (ULTRAM ) 50 MG tablet TAKE 1 TABLET BY MOUTH EVERY 6 HOURS AS NEEDED. 90 tablet 2   vitamin B-12 (CYANOCOBALAMIN ) 500 MCG tablet Take 500 mcg by mouth daily.  atenolol  (TENORMIN ) 50 MG tablet Take 1 tablet (50 mg total) by mouth 2 (two) times daily. 180 tablet 1   losartan  (COZAAR ) 100 MG tablet Take 1 tablet (100 mg total) by mouth daily. 90 tablet 1   metFORMIN  (GLUCOPHAGE ) 1000 MG tablet TAKE 1 TABLET (1,000 MG TOTAL) BY MOUTH TWICE A DAY WITH FOOD 180 tablet 1   omeprazole  (PRILOSEC) 20 MG capsule TAKE 1 CAPSULE BY MOUTH EVERY DAY 90 capsule 1   diphenhydrAMINE (SOMINEX) 25 MG tablet Take 25 mg by mouth at bedtime as needed for sleep. (Patient not taking: Reported on 12/14/2023)     meloxicam  (MOBIC ) 7.5 MG tablet TAKE 1 TABLET BY MOUTH EVERY DAY (Patient not taking: Reported on 12/14/2023) 30 tablet 2   Facility-Administered Medications Prior to Visit  Medication Dose Route Frequency Provider Last Rate Last Admin   ipratropium  (ATROVENT ) nebulizer solution 0.5 mg  0.5 mg Nebulization Once Kaur, Charanpreet, NP        Review of Systems;  Patient denies headache, fevers, malaise, unintentional weight loss, skin rash, eye pain, sinus congestion and sinus pain, sore throat, dysphagia,  hemoptysis , cough, dyspnea, wheezing, chest pain, palpitations, orthopnea, edema, abdominal pain, nausea, melena, diarrhea, constipation, flank pain, dysuria, hematuria, urinary  Frequency, nocturia, numbness, tingling, seizures,  Focal weakness, Loss of consciousness,  Tremor, insomnia, depression, anxiety, and suicidal ideation.      Objective:  BP 136/66   Pulse 70   Ht 5\' 4"  (1.626 m)   Wt 143 lb 12.8 oz (65.2 kg)   SpO2 94%   BMI 24.68 kg/m   BP Readings from Last 3 Encounters:  12/14/23 136/66  06/15/23 (!) 150/84  05/31/23 (!) 155/80    Wt Readings from Last 3 Encounters:  12/14/23 143 lb 12.8 oz (65.2 kg)  09/15/23 144 lb (65.3 kg)  06/15/23 147 lb (66.7 kg)    Physical Exam Vitals reviewed.  Constitutional:      General: She is not in acute distress.    Appearance: Normal appearance. She is normal weight. She is not ill-appearing, toxic-appearing or diaphoretic.  HENT:     Head: Normocephalic.  Eyes:     General: No scleral icterus.       Right eye: No discharge.        Left eye: No discharge.     Conjunctiva/sclera: Conjunctivae normal.  Cardiovascular:     Rate and Rhythm: Normal rate and regular rhythm.     Heart sounds: Normal heart sounds.  Pulmonary:     Effort: Pulmonary effort is normal. No respiratory distress.     Breath sounds: Normal breath sounds.  Musculoskeletal:        General: Normal range of motion.  Skin:    General: Skin is warm and dry.  Neurological:     General: No focal deficit present.     Mental Status: She is alert and oriented to person, place, and time. Mental status is at baseline.  Psychiatric:        Mood and Affect: Mood normal.        Behavior: Behavior normal.         Thought Content: Thought content normal.        Judgment: Judgment normal.     Lab Results  Component Value Date   HGBA1C 6.4 (A) 12/14/2023   HGBA1C 6.7 (H) 06/15/2023   HGBA1C 6.0 03/16/2023    Lab Results  Component Value Date   CREATININE 1.01 06/15/2023   CREATININE 1.03  03/16/2023   CREATININE 1.30 (H) 01/29/2023    Lab Results  Component Value Date   WBC 10.6 (H) 09/04/2022   HGB 12.1 09/04/2022   HCT 36.7 09/04/2022   PLT 365.0 09/04/2022   GLUCOSE 149 (H) 06/15/2023   CHOL 185 06/15/2023   TRIG 363.0 (H) 06/15/2023   HDL 50.40 06/15/2023   LDLDIRECT 89.0 06/15/2023   LDLCALC 62 06/15/2023   ALT 10 06/15/2023   AST 16 06/15/2023   NA 143 06/15/2023   K 4.1 06/15/2023   CL 103 06/15/2023   CREATININE 1.01 06/15/2023   BUN 24 (H) 06/15/2023   CO2 32 06/15/2023   TSH 3.49 12/14/2022   INR 0.88 02/17/2018   HGBA1C 6.4 (A) 12/14/2023   MICROALBUR 1.0 03/22/2023    No results found.  Assessment & Plan:  .Encounter for screening mammogram for malignant neoplasm of breast -     3D Screening Mammogram, Left and Right; Future  HTN (hypertension), benign -     Comprehensive metabolic panel with GFR  Hyperlipidemia associated with type 2 diabetes mellitus (HCC) -     Lipid panel -     LDL cholesterol, direct  Type 2 diabetes mellitus with diabetic neuropathy, with long-term current use of insulin  (HCC) Assessment & Plan: Improved control with ozempic  and metformin , but stopping metformin  due to watery diarrhea.  Recommend adding milk at bedtime to prevent recurrent hypoglycemia   Orders: -     Comprehensive metabolic panel with GFR -     POCT glycosylated hemoglobin (Hb A1C)  Acquired hypothyroidism Assessment & Plan: TSH NORMALIZED  on 100 MCG DAILY .  Repeat TSH is due .   Lab Results  Component Value Date   TSH 3.49 12/14/2022     Orders: -     TSH  Diarrhea of presumed infectious origin Assessment & Plan: Infectious causes need  to be uled out if he diarrhea persists after stopping metformin .     Orders: -     Clostridium Difficile by PCR; Future -     Gastrointestinal Panel by PCR , Stool; Future -     Ova and parasite examination; Future -     Stool culture; Future -     Pancreatic elastase, fecal; Future  B12 deficiency Assessment & Plan: New onset,  May be contributing to neuropathy.  b12 deficiency diagnosed. patient has been managing with weekly B12 injections   Lab Results  Component Value Date   VITAMINB12 642 12/14/2022       Stage 3b chronic kidney disease (HCC) Assessment & Plan: GFR  is < 50 ml/min but stable.  She has been referred to Nephrology  Lab Results  Component Value Date   CREATININE 1.01 06/15/2023     Lab Results  Component Value Date   LABMICR 25.6 02/14/2021   MICROALBUR 1.0 03/22/2023   MICROALBUR 4.0 03/20/2022     Lab Results  Component Value Date   CREATININE 1.01 06/15/2023      Other orders -     Atenolol ; Take 1 tablet (50 mg total) by mouth 2 (two) times daily.  Dispense: 180 tablet; Refill: 1 -     Losartan  Potassium; Take 1 tablet (100 mg total) by mouth daily.  Dispense: 90 tablet; Refill: 1 -     metFORMIN  HCl; TAKE 1 TABLET (1,000 MG TOTAL) BY MOUTH TWICE A DAY WITH FOOD  Dispense: 180 tablet; Refill: 1 -     Omeprazole ; Take 1 capsule (20 mg total) by  mouth daily.  Dispense: 90 capsule; Refill: 1     I spent 34 minutes on the day of this face to face encounter reviewing patient's  most recent visit with cardiology,  nephrology,  and neurology,  prior relevant surgical and non surgical procedures, recent  labs and imaging studies, counseling on weight management,  reviewing the assessment and plan with patient, and post visit ordering and reviewing of  diagnostics and therapeutics with patient  .   Follow-up: Return in about 1 month (around 01/13/2024) for diarrhea.   Thersia Flax, MD

## 2023-12-14 NOTE — Assessment & Plan Note (Signed)
 New onset,  May be contributing to neuropathy.  b12 deficiency diagnosed. patient has been managing with weekly B12 injections   Lab Results  Component Value Date   VITAMINB12 642 12/14/2022

## 2023-12-14 NOTE — Assessment & Plan Note (Signed)
 Infectious causes need to be uled out if he diarrhea persists after stopping metformin .

## 2023-12-14 NOTE — Assessment & Plan Note (Signed)
 GFR  is < 50 ml/min but stable.  She has been referred to Nephrology  Lab Results  Component Value Date   CREATININE 1.01 06/15/2023     Lab Results  Component Value Date   LABMICR 25.6 02/14/2021   MICROALBUR 1.0 03/22/2023   MICROALBUR 4.0 03/20/2022     Lab Results  Component Value Date   CREATININE 1.01 06/15/2023

## 2023-12-14 NOTE — Assessment & Plan Note (Signed)
 Improved control with ozempic  and metformin , but stopping metformin  due to watery diarrhea.  Recommend adding milk at bedtime to prevent recurrent hypoglycemia

## 2023-12-15 ENCOUNTER — Ambulatory Visit: Payer: Self-pay | Admitting: Internal Medicine

## 2023-12-15 DIAGNOSIS — R748 Abnormal levels of other serum enzymes: Secondary | ICD-10-CM

## 2023-12-15 DIAGNOSIS — R932 Abnormal findings on diagnostic imaging of liver and biliary tract: Secondary | ICD-10-CM

## 2023-12-15 LAB — LIPID PANEL
Cholesterol: 137 mg/dL (ref 0–200)
HDL: 45.4 mg/dL (ref >39.00)
LDL Cholesterol: 63 mg/dL (ref 0–99)
NonHDL: 91.67
Total CHOL/HDL Ratio: 3
Triglycerides: 145 mg/dL (ref 0.0–149.0)
VLDL: 29 mg/dL (ref 0.0–40.0)

## 2023-12-15 LAB — COMPREHENSIVE METABOLIC PANEL WITH GFR
ALT: 9 U/L (ref 0–35)
AST: 13 U/L (ref 0–37)
Albumin: 4.3 g/dL (ref 3.5–5.2)
Alkaline Phosphatase: 131 U/L — ABNORMAL HIGH (ref 39–117)
BUN: 14 mg/dL (ref 6–23)
CO2: 28 meq/L (ref 19–32)
Calcium: 8.9 mg/dL (ref 8.4–10.5)
Chloride: 103 meq/L (ref 96–112)
Creatinine, Ser: 0.94 mg/dL (ref 0.40–1.20)
GFR: 59.22 mL/min — ABNORMAL LOW (ref >60.00)
Glucose, Bld: 112 mg/dL — ABNORMAL HIGH (ref 70–99)
Potassium: 4.9 meq/L (ref 3.5–5.1)
Sodium: 140 meq/L (ref 135–145)
Total Bilirubin: 0.5 mg/dL (ref 0.2–1.2)
Total Protein: 7.1 g/dL (ref 6.0–8.3)

## 2023-12-15 LAB — LDL CHOLESTEROL, DIRECT: Direct LDL: 70 mg/dL

## 2023-12-15 LAB — TSH: TSH: 2.43 u[IU]/mL (ref 0.450–4.500)

## 2023-12-18 NOTE — Addendum Note (Signed)
 Addended by: Thersia Flax on: 12/18/2023 06:21 PM   Modules accepted: Orders

## 2024-01-04 ENCOUNTER — Other Ambulatory Visit (INDEPENDENT_AMBULATORY_CARE_PROVIDER_SITE_OTHER)

## 2024-01-04 DIAGNOSIS — R748 Abnormal levels of other serum enzymes: Secondary | ICD-10-CM | POA: Diagnosis not present

## 2024-01-04 LAB — HEPATIC FUNCTION PANEL
ALT: 9 U/L (ref 0–35)
AST: 12 U/L (ref 0–37)
Albumin: 4.1 g/dL (ref 3.5–5.2)
Alkaline Phosphatase: 149 U/L — ABNORMAL HIGH (ref 39–117)
Bilirubin, Direct: 0.1 mg/dL (ref 0.0–0.3)
Total Bilirubin: 0.5 mg/dL (ref 0.2–1.2)
Total Protein: 6.8 g/dL (ref 6.0–8.3)

## 2024-01-05 NOTE — Addendum Note (Signed)
 Addended by: MARYLYNN VERNEITA CROME on: 01/05/2024 09:45 PM   Modules accepted: Orders

## 2024-01-06 NOTE — Telephone Encounter (Unsigned)
 Copied from CRM [id number]. Topic: Clinical - Lab/Test Results >> Jan 06, 2024 10:51 AM Franky GRADE wrote: Reason for CRM: Patient is returning a call she received from Norine Skelton, CMA. Advised of the message Dr.Tullo left and provided patient with the imaging scheduling line to schedule the Ultrasound.

## 2024-01-11 ENCOUNTER — Other Ambulatory Visit: Payer: Self-pay | Admitting: Internal Medicine

## 2024-01-12 ENCOUNTER — Ambulatory Visit
Admission: RE | Admit: 2024-01-12 | Discharge: 2024-01-12 | Disposition: A | Discharge status: Home / Self Care | Source: Ambulatory Visit | Attending: Internal Medicine | Admitting: Internal Medicine

## 2024-01-12 DIAGNOSIS — R748 Abnormal levels of other serum enzymes: Secondary | ICD-10-CM

## 2024-01-13 ENCOUNTER — Ambulatory Visit: Payer: Self-pay | Admitting: Internal Medicine

## 2024-01-13 DIAGNOSIS — K760 Fatty (change of) liver, not elsewhere classified: Secondary | ICD-10-CM

## 2024-01-13 NOTE — Assessment & Plan Note (Signed)
 Suggested by ultrasound July 2025 and elevated alk phos   Lab Results  Component Value Date   ALT 9 01/04/2024   AST 12 01/04/2024   ALKPHOS 149 (H) 01/04/2024   BILITOT 0.5 01/04/2024

## 2024-01-14 ENCOUNTER — Encounter: Payer: Self-pay | Admitting: Internal Medicine

## 2024-01-14 ENCOUNTER — Ambulatory Visit: Admitting: Internal Medicine

## 2024-01-14 VITALS — BP 136/68 | HR 68 | Temp 98.3°F | Resp 20 | Ht 64.0 in | Wt 149.0 lb

## 2024-01-14 DIAGNOSIS — R748 Abnormal levels of other serum enzymes: Secondary | ICD-10-CM

## 2024-01-14 DIAGNOSIS — R197 Diarrhea, unspecified: Secondary | ICD-10-CM

## 2024-01-14 DIAGNOSIS — E785 Hyperlipidemia, unspecified: Secondary | ICD-10-CM

## 2024-01-14 DIAGNOSIS — E1140 Type 2 diabetes mellitus with diabetic neuropathy, unspecified: Secondary | ICD-10-CM

## 2024-01-14 DIAGNOSIS — I10 Essential (primary) hypertension: Secondary | ICD-10-CM

## 2024-01-14 DIAGNOSIS — Z1231 Encounter for screening mammogram for malignant neoplasm of breast: Secondary | ICD-10-CM | POA: Diagnosis not present

## 2024-01-14 DIAGNOSIS — N1832 Chronic kidney disease, stage 3b: Secondary | ICD-10-CM

## 2024-01-14 DIAGNOSIS — E1169 Type 2 diabetes mellitus with other specified complication: Secondary | ICD-10-CM

## 2024-01-14 DIAGNOSIS — Z794 Long term (current) use of insulin: Secondary | ICD-10-CM

## 2024-01-14 DIAGNOSIS — J438 Other emphysema: Secondary | ICD-10-CM

## 2024-01-14 LAB — IBC + FERRITIN
Ferritin: 8 ng/mL — ABNORMAL LOW (ref 10.0–291.0)
Iron: 54 ug/dL (ref 42–145)
Saturation Ratios: 14.6 % — ABNORMAL LOW (ref 20.0–50.0)
TIBC: 371 ug/dL (ref 250.0–450.0)
Transferrin: 265 mg/dL (ref 212.0–360.0)

## 2024-01-14 MED ORDER — AMLODIPINE-OLMESARTAN 5-20 MG PO TABS: 1.0000 | ORAL_TABLET | Freq: Every day | ORAL | 1 refills | Status: DC

## 2024-01-14 NOTE — Assessment & Plan Note (Signed)
 Not at goal  on losartan  and atenolol .  Changing to amlodipine /olmesartan ,  continue atenolol 

## 2024-01-14 NOTE — Patient Instructions (Signed)
 YOUR MAMMOGRAM Ihas been ordered , PLEASE CALL AND GET THIS SCHEDULED! [facility] - call [phone]    Stop losartan  when you start amlodipine /olmesartan  for blood pressure   Avoid low blood sugars by eating a mid day  and midnight snack

## 2024-01-14 NOTE — Assessment & Plan Note (Signed)
 Infectious etiologies have not been ruled out because diarrhea has resolved with metformin  suspension

## 2024-01-14 NOTE — Progress Notes (Unsigned)
 Subjective:  Patient ID: Maria Hinton, female    DOB: 06-27-1946  Age: 78 y.o. MRN: [id number]  CC: There were no encounter diagnoses.   HPI Maria Hinton presents for  Chief Complaint  Patient presents with   Diarrhea    1 month has not had since she has been on Metformin      1) Diarrhea has resolved since stopping metformin    2) type 2 DM:   She  feels generally well, Is walking 3 days/week  I have downloaded and reviewed the data from patient's continuous blood glucose monitor  for  the period  of    June 28 to   July 11.   Patient's  sugars have been  IN RANGE   88  % OF THE TIME,   ABOVE RANGE 10   % of the time.  And  BELOW  RANGE  1   % OF THE TIME .  Medications currently used for glycemic controlOzempic 0.5 mg weekly.  Metformin  was stopped after last visit due to recurrent diarrhea.  Patient reports compliance with medication approximately 100   % of the tine.  Medication changes based on this review  are as follows:  she is taking her   medications as directed. Following a carbohydrate modified diet 6 days per week. Denies numbness, burning and tingling of extremities. Appetite is good.    2) elevated alk phos:  fatty liver suggested by ultrasound.    Screening labs today for autoimmune causes.  Diagnosis   3) Hypertension: patient checks blood pressure twice weekly at home.  Readings have been for the most part > 130/80 at rest  and < 150/90 on atenolol  50 mg bid and losartan  100 mg daily . Patient is following a reduced salt diet most days and is taking medications as prescribed   Outpatient Medications Prior to Visit  Medication Sig Dispense Refill   albuterol  (VENTOLIN  HFA) 108 (90 Base) MCG/ACT inhaler Inhale 2 puffs into the lungs every 6 (six) hours as needed for wheezing. 6.7 g 11   aspirin EC 81 MG tablet Take 81 mg by mouth daily.     atenolol  (TENORMIN ) 50 MG tablet Take 1 tablet (50 mg total) by mouth 2 (two) times daily. 180 tablet 1   atorvastatin  (LIPITOR)  40 MG tablet TAKE 1 TABLET BY MOUTH EVERY DAY 90 tablet 1   BD PEN NEEDLE NANO 2ND GEN 32G X 4 MM MISC USE DAILY AS DIRECTED WITH LEVEMIR  100 each 1   gabapentin  (NEURONTIN ) 100 MG capsule TAKE 1 CAPSULE (100 MG TOTAL) BY MOUTH THREE TIMES DAILY. 90 capsule 0   gabapentin  (NEURONTIN ) 300 MG capsule TAKE 1 CAPSULE BY MOUTH 4 TIMES DAILY. 120 capsule 1   glucose blood (ONETOUCH VERIO) test strip Use as instructed to check blood sugars 3 times daily 100 strip 12   levothyroxine  (SYNTHROID ) 100 MCG tablet Take 1 tablet (100 mcg total) by mouth daily. 90 tablet 3   losartan  (COZAAR ) 100 MG tablet Take 1 tablet (100 mg total) by mouth daily. 90 tablet 1   omeprazole  (PRILOSEC) 20 MG capsule Take 1 capsule (20 mg total) by mouth daily. 90 capsule 1   OneTouch Delica Lancets 30G MISC 1 application by Does not apply route 2 (two) times daily. Use to check blood sugar up to two times daily. E11.69 100 each 11   Semaglutide ,0.25 or 0.5MG /DOS, (OZEMPIC , 0.25 OR 0.5 MG/DOSE,) 2 MG/3ML SOPN Inject 0.5 mg into the skin once  a week. 3 mL 2   temazepam  (RESTORIL ) 7.5 MG capsule TAKE 1 CAPSULE (7.5 MG TOTAL) BY MOUTH EVERY DAY AT BEDTIME AS NEEDED FOR SLEEP 30 capsule 2   traMADol  (ULTRAM ) 50 MG tablet TAKE 1 TABLET BY MOUTH EVERY 6 HOURS AS NEEDED. 90 tablet 2   vitamin B-12 (CYANOCOBALAMIN ) 500 MCG tablet Take 500 mcg by mouth daily.     metFORMIN  (GLUCOPHAGE ) 1000 MG tablet TAKE 1 TABLET (1,000 MG TOTAL) BY MOUTH TWICE A DAY WITH FOOD 180 tablet 1   Facility-Administered Medications Prior to Visit  Medication Dose Route Frequency Provider Last Rate Last Admin   ipratropium (ATROVENT ) nebulizer solution 0.5 mg  0.5 mg Nebulization Once Kaur, Charanpreet, NP        Review of Systems;  Patient denies headache, fevers, malaise, unintentional weight loss, skin rash, eye pain, sinus congestion and sinus pain, sore throat, dysphagia,  hemoptysis , cough, dyspnea, wheezing, chest pain, palpitations, orthopnea, edema,  abdominal pain, nausea, melena, diarrhea, constipation, flank pain, dysuria, hematuria, urinary  Frequency, nocturia, numbness, tingling, seizures,  Focal weakness, Loss of consciousness,  Tremor, insomnia, depression, anxiety, and suicidal ideation.      Objective:  BP 136/68   Pulse 68   Temp 98.3 F (36.8 C)   Resp 20   Ht 5' 4 (1.626 m)   Wt 149 lb (67.6 kg)   SpO2 96%   BMI 25.58 kg/m   BP Readings from Last 3 Encounters:  01/14/24 136/68  12/14/23 136/66  06/15/23 (!) 150/84    Wt Readings from Last 3 Encounters:  01/14/24 149 lb (67.6 kg)  12/14/23 143 lb 12.8 oz (65.2 kg)  09/15/23 144 lb (65.3 kg)    Physical Exam  Lab Results  Component Value Date   HGBA1C 6.4 (A) 12/14/2023   HGBA1C 6.7 (H) 06/15/2023   HGBA1C 6.0 03/16/2023    Lab Results  Component Value Date   CREATININE 0.94 12/14/2023   CREATININE 1.01 06/15/2023   CREATININE 1.03 03/16/2023    Lab Results  Component Value Date   WBC 10.6 (H) 09/04/2022   HGB 12.1 09/04/2022   HCT 36.7 09/04/2022   PLT 365.0 09/04/2022   GLUCOSE 112 (H) 12/14/2023   CHOL 137 12/14/2023   TRIG 145.0 12/14/2023   HDL 45.40 12/14/2023   LDLDIRECT 70.0 12/14/2023   LDLCALC 63 12/14/2023   ALT 9 01/04/2024   AST 12 01/04/2024   NA 140 12/14/2023   K 4.9 12/14/2023   CL 103 12/14/2023   CREATININE 0.94 12/14/2023   BUN 14 12/14/2023   CO2 28 12/14/2023   TSH 2.430 12/14/2023   INR 0.88 02/17/2018   HGBA1C 6.4 (A) 12/14/2023   MICROALBUR 4.0 03/20/2022    US  Abdomen Limited RUQ (LIVER/GB) Result Date: 01/12/2024 CLINICAL DATA:  Elevated alkaline phosphatase levels. Type 2 diabetes. EXAM: ULTRASOUND ABDOMEN LIMITED RIGHT UPPER QUADRANT COMPARISON:  11/18/2017 FINDINGS: Gallbladder: Gallstones: None Sludge: None Gallbladder Wall: Within normal limits Pericholecystic fluid: None Sonographic Murphy's Sign: Negative per technologist Common bile duct: Diameter: 7 mm Liver: Parenchymal echogenicity: Diffusely  heterogeneous Contours: Normal Lesions: None Portal vein: Patent.  Hepatopetal flow Other: None. IMPRESSION: Diffuse heterogeneity of the hepatic parenchyma is a nonspecific indicator of hepatocellular dysfunction. No focal hepatic lesion identified. Electronically Signed   By: Aliene Lloyd M.D.   On: 01/12/2024 16:28    Assessment & Plan:  .There are no diagnoses linked to this encounter.   I spent 34 minutes on the day of this  face to face encounter reviewing patient's  most recent visit with cardiology,  nephrology,  and neurology,  prior relevant surgical and non surgical procedures, recent  labs and imaging studies, counseling on weight management,  reviewing the assessment and plan with patient, and post visit ordering and reviewing of  diagnostics and therapeutics with patient  .   Follow-up: No follow-ups on file.   Verneita LITTIE Kettering, MD

## 2024-01-14 NOTE — Addendum Note (Signed)
 Addended by: MARYLEN PRO A on: 01/14/2024 01:39 PM   Modules accepted: Orders

## 2024-01-15 LAB — HEPATIC FUNCTION PANEL
ALT: 8 IU/L (ref 0–32)
AST: 15 IU/L (ref 0–40)
Albumin: 4.1 g/dL (ref 3.8–4.8)
Alkaline Phosphatase: 184 IU/L — ABNORMAL HIGH (ref 44–121)
Bilirubin Total: 0.3 mg/dL (ref 0.0–1.2)
Bilirubin, Direct: 0.12 mg/dL (ref 0.00–0.40)
Total Protein: 6.7 g/dL (ref 6.0–8.5)

## 2024-01-16 DIAGNOSIS — J438 Other emphysema: Secondary | ICD-10-CM | POA: Insufficient documentation

## 2024-01-16 NOTE — Assessment & Plan Note (Signed)
 Low blood sugars addressed with light snacks. No medication changes   Lab Results  Component Value Date   HGBA1C 6.4 (A) 12/14/2023

## 2024-01-16 NOTE — Assessment & Plan Note (Signed)
 She is asymptomatic with ADLS and with walking

## 2024-01-16 NOTE — Assessment & Plan Note (Signed)
 GFR  is < 50 ml/min but stable.  She has been referred to Nephrology AND IS SEEING DR  DOMINICA SEMI ANNUALLY   Lab Results  Component Value Date   CREATININE 0.94 12/14/2023     Lab Results  Component Value Date   LABMICR 25.6 02/14/2021   MICROALBUR 4.0 03/20/2022     Lab Results  Component Value Date   CREATININE 0.94 12/14/2023

## 2024-01-16 NOTE — Assessment & Plan Note (Signed)
 Currently well-controlled on current medications .  hemoglobin A1c is at goal of less than 7.0 . Patient is reminded to schedule an annual eye exam and foot exam is normal today. Patient has no microalbuminuria. Patient is tolerating statin therapy for CAD risk reduction and on ACE/ARB for renal protection and hypertension  Tolerating lipitor .  Direct LDL is 74.[name]  No changes  Lab Results  Component Value Date   HGBA1C 6.4 (A) 12/14/2023    Lab Results  Component Value Date   CHOL 137 12/14/2023   HDL 45.40 12/14/2023   LDLCALC 63 12/14/2023   LDLDIRECT 70.0 12/14/2023   TRIG 145.0 12/14/2023   CHOLHDL 3 12/14/2023

## 2024-01-18 ENCOUNTER — Encounter: Payer: Self-pay | Admitting: Internal Medicine

## 2024-01-18 ENCOUNTER — Telehealth: Payer: Self-pay

## 2024-01-18 MED ORDER — TRAMADOL HCL 50 MG PO TABS: 50.0000 mg | ORAL_TABLET | Freq: Four times a day (QID) | ORAL | 2 refills | Status: DC | PRN

## 2024-01-18 NOTE — Telephone Encounter (Signed)
 Sorry about that its the tramadol . I have pended the medication.

## 2024-01-18 NOTE — Telephone Encounter (Signed)
 Refilled: 10/25/2023 Last OV: 01/14/2024 Next OV: 04/17/2024

## 2024-01-19 LAB — ANTI-SMOOTH MUSCLE ANTIBODY, IGG: Actin (Smooth Muscle) Antibody (IGG): <20 U (ref <20)

## 2024-01-19 LAB — ANTI-MICROSOMAL ANTIBODY LIVER / KIDNEY: LKM1 Ab: <=20 U (ref <=20.0)

## 2024-01-19 LAB — MITOCHONDRIAL ANTIBODIES: Mitochondrial M2 Ab, IgG: 24 U — ABNORMAL HIGH (ref <=20.0)

## 2024-01-19 LAB — ANTI-SMITH ANTIBODY: ENA SM Ab Ser-aCnc: <1 AI

## 2024-01-19 LAB — HEPATITIS B CORE ANTIBODY, TOTAL: Hep B Core Total Ab: NONREACTIVE

## 2024-01-19 LAB — ALPHA-1-ANTITRYPSIN: A-1 Antitrypsin, Ser: 139 mg/dL (ref 83–199)

## 2024-01-19 LAB — HEPATITIS B SURFACE ANTIGEN: Hepatitis B Surface Ag: NONREACTIVE

## 2024-01-19 LAB — HEPATITIS C ANTIBODY: Hepatitis C Ab: NONREACTIVE

## 2024-01-19 LAB — ANA: Anti Nuclear Antibody (ANA): NEGATIVE

## 2024-01-19 LAB — CERULOPLASMIN: Ceruloplasmin: 27 mg/dL (ref 14–48)

## 2024-01-20 NOTE — Addendum Note (Signed)
 Addended by: MARYLYNN VERNEITA CROME on: 01/20/2024 08:11 AM   Modules accepted: Orders

## 2024-01-24 ENCOUNTER — Encounter: Payer: Self-pay | Admitting: Internal Medicine

## 2024-01-24 NOTE — Telephone Encounter (Signed)
PA for Tramadol is needed

## 2024-01-28 ENCOUNTER — Telehealth: Payer: Self-pay | Admitting: Internal Medicine

## 2024-01-28 NOTE — Telephone Encounter (Signed)
 Lab order needed

## 2024-02-01 ENCOUNTER — Other Ambulatory Visit (HOSPITAL_COMMUNITY): Payer: Self-pay

## 2024-02-01 ENCOUNTER — Telehealth: Payer: Self-pay

## 2024-02-01 ENCOUNTER — Encounter: Payer: Self-pay | Admitting: Pharmacist

## 2024-02-01 NOTE — Telephone Encounter (Signed)
 Pt is aware and gave a verbal understanding.

## 2024-02-01 NOTE — Telephone Encounter (Signed)
 PAP: Patient assistance application for Ozempic  through [employer] has been mailed to pt's home address on file. Provider portion of application will be faxed to provider's office. Patient portion e-filed for renewal 2025.

## 2024-02-01 NOTE — Telephone Encounter (Signed)
 Pharmacy Patient Advocate Encounter   Received notification from Patient Advice Request messages that prior authorization for Tramadol  is required/requested.   Insurance verification completed.    The patient is insured through [facility] .   Per test claim: PA required and submitted KEY/EOC/Request #:  [id number] Pending Response from Payer   Morene Potters, CPhT Supervisor Pharmacy Patient Advocate [facility] [phone] (Ph) 02/01/2024 9:18 AM

## 2024-02-01 NOTE — Telephone Encounter (Signed)
 Spoke with pt and she stated that she has been getting her ozempic  through pt assistance and has ran out. We have not received a shipment for her either.

## 2024-02-01 NOTE — Progress Notes (Signed)
 Brief Telephone Documentation Reason for Call: Patient left message regarding patient assistance medications   Summary of Call: Enrolled in [employer] PAP for: Ozempic , Novofine Needles, Fiasp  though notes she has not received a recent shipment.   Per last note from [facility] PAP team: Patient approved through 07/05/2024.   Per [employer] automated system, patient does not have active enrollment/member not found.     Follow Up: Patient given direct line for further questions/concerns.  Maria Hinton, PharmD Clinical Pharmacist [facility] [phone]

## 2024-02-01 NOTE — Telephone Encounter (Addendum)
 Pharmacy Patient Advocate Encounter  Received notification from [facility] that Prior Authorization for Tramadol  has been APPROVED from 07/07/23 to 07/05/24   PA #/Case ID/Reference #: [id number]   Morene Potters, CPhT Supervisor Pharmacy Patient Advocate [facility] [phone] (Ph) 02/01/2024 9:29 AM

## 2024-02-04 ENCOUNTER — Other Ambulatory Visit

## 2024-02-10 NOTE — Telephone Encounter (Signed)
 Faxed provider's office again with Pap application for Ozempic  for review and signature.

## 2024-02-16 ENCOUNTER — Other Ambulatory Visit: Payer: Self-pay | Admitting: Internal Medicine

## 2024-02-16 NOTE — Telephone Encounter (Signed)
 PAP: Application for Ozempic has been submitted to [employer], via fax

## 2024-02-17 ENCOUNTER — Other Ambulatory Visit: Payer: Self-pay | Admitting: Internal Medicine

## 2024-02-17 NOTE — Progress Notes (Signed)
 Pharmacy Medication Assistance Program Note    02/17/2024  Patient ID: Maria Hinton, female  DOB: [birth year], 78 y.o.  MRN:  [id number]     02/17/2024  Outreach Medication Two  Initial Outreach Date (Medication Two) 02/01/2024  Manufacturer Medication Two [employer]  [employer] Drugs Ozempic   Dose of Ozempic  0.5 mg  Type of Radiographer, therapeutic Assistance  Date Application Sent to Prescriber 02/01/2024  Name of Prescriber Verneita Kettering  Application Items Received From Patient Application  Date Application Received From Provider 02/15/2024  Method Application Sent to Manufacturer Fax  Date Application Submitted to Manufacturer 02/15/2024  Patient Assistance Determination Approved  Approval Start Date 02/17/2024  Patient Notification Method MyChart

## 2024-02-17 NOTE — Telephone Encounter (Signed)
 PAP: Patient assistance application for Ozempic  has been approved by PAP Companies: [employer] from 02/17/2024 to 07/05/2024. Medication should be delivered to PAP Delivery: Provider's office. For further shipping updates, please contact [employer] at [phone]. Patient ID is: not provided.

## 2024-02-18 ENCOUNTER — Encounter: Payer: Self-pay | Admitting: Internal Medicine

## 2024-02-22 LAB — HM DIABETES EYE EXAM

## 2024-03-01 ENCOUNTER — Telehealth: Payer: Self-pay | Admitting: *Deleted

## 2024-03-01 ENCOUNTER — Encounter: Payer: Self-pay | Admitting: *Deleted

## 2024-03-01 NOTE — Telephone Encounter (Signed)
 Patient notified that Patient Assistance Medication are in the office & are ready for pick up.   Medication: Ozempic  2mg /66ml  Quantity: 4 boxes  Lot# [id number]  Exp: 08/05/2026

## 2024-03-09 NOTE — Telephone Encounter (Signed)
 Pt stopped in and picked up Ozempic  today -kh

## 2024-03-10 ENCOUNTER — Other Ambulatory Visit: Payer: Self-pay | Admitting: Internal Medicine

## 2024-04-04 ENCOUNTER — Other Ambulatory Visit: Payer: Self-pay | Admitting: Internal Medicine

## 2024-04-11 ENCOUNTER — Encounter: Payer: Self-pay | Admitting: Pharmacist

## 2024-04-17 ENCOUNTER — Ambulatory Visit: Admitting: Internal Medicine

## 2024-04-17 ENCOUNTER — Encounter: Payer: Self-pay | Admitting: Pharmacist

## 2024-04-17 ENCOUNTER — Encounter: Payer: Self-pay | Admitting: Internal Medicine

## 2024-04-17 VITALS — BP 130/72 | HR 64 | Ht 64.0 in | Wt 153.8 lb

## 2024-04-17 DIAGNOSIS — K629 Disease of anus and rectum, unspecified: Secondary | ICD-10-CM

## 2024-04-17 DIAGNOSIS — E785 Hyperlipidemia, unspecified: Secondary | ICD-10-CM | POA: Diagnosis not present

## 2024-04-17 DIAGNOSIS — N814 Uterovaginal prolapse, unspecified: Secondary | ICD-10-CM

## 2024-04-17 DIAGNOSIS — N1832 Chronic kidney disease, stage 3b: Secondary | ICD-10-CM

## 2024-04-17 DIAGNOSIS — I10 Essential (primary) hypertension: Secondary | ICD-10-CM

## 2024-04-17 DIAGNOSIS — E1140 Type 2 diabetes mellitus with diabetic neuropathy, unspecified: Secondary | ICD-10-CM | POA: Diagnosis not present

## 2024-04-17 DIAGNOSIS — R159 Full incontinence of feces: Secondary | ICD-10-CM

## 2024-04-17 DIAGNOSIS — E1169 Type 2 diabetes mellitus with other specified complication: Secondary | ICD-10-CM | POA: Diagnosis not present

## 2024-04-17 DIAGNOSIS — Z79899 Other long term (current) drug therapy: Secondary | ICD-10-CM

## 2024-04-17 DIAGNOSIS — Z794 Long term (current) use of insulin: Secondary | ICD-10-CM

## 2024-04-17 DIAGNOSIS — R300 Dysuria: Secondary | ICD-10-CM

## 2024-04-17 DIAGNOSIS — Z23 Encounter for immunization: Secondary | ICD-10-CM | POA: Diagnosis not present

## 2024-04-17 LAB — LIPID PANEL
Cholesterol: 138 mg/dL (ref 0–200)
HDL: 55 mg/dL (ref >39.00)
LDL Cholesterol: 59 mg/dL (ref 0–99)
NonHDL: 83.24
Total CHOL/HDL Ratio: 3
Triglycerides: 122 mg/dL (ref 0.0–149.0)
VLDL: 24.4 mg/dL (ref 0.0–40.0)

## 2024-04-17 LAB — COMPREHENSIVE METABOLIC PANEL WITH GFR
ALT: 8 U/L (ref 0–35)
AST: 12 U/L (ref 0–37)
Albumin: 4.2 g/dL (ref 3.5–5.2)
Alkaline Phosphatase: 148 U/L — ABNORMAL HIGH (ref 39–117)
BUN: 17 mg/dL (ref 6–23)
CO2: 31 meq/L (ref 19–32)
Calcium: 8.7 mg/dL (ref 8.4–10.5)
Chloride: 100 meq/L (ref 96–112)
Creatinine, Ser: 1.03 mg/dL (ref 0.40–1.20)
GFR: 52.94 mL/min — ABNORMAL LOW (ref >60.00)
Glucose, Bld: 272 mg/dL — ABNORMAL HIGH (ref 70–99)
Potassium: 4.9 meq/L (ref 3.5–5.1)
Sodium: 139 meq/L (ref 135–145)
Total Bilirubin: 0.4 mg/dL (ref 0.2–1.2)
Total Protein: 7.2 g/dL (ref 6.0–8.3)

## 2024-04-17 LAB — CBC WITH DIFFERENTIAL/PLATELET
Basophils Absolute: 0.1 K/uL (ref 0.0–0.1)
Basophils Relative: 0.7 % (ref 0.0–3.0)
Eosinophils Absolute: 0.4 K/uL (ref 0.0–0.7)
Eosinophils Relative: 4.5 % (ref 0.0–5.0)
HCT: 36 % (ref 36.0–46.0)
Hemoglobin: 11.7 g/dL — ABNORMAL LOW (ref 12.0–15.0)
Lymphocytes Relative: 21.4 % (ref 12.0–46.0)
Lymphs Abs: 1.8 K/uL (ref 0.7–4.0)
MCHC: 32.4 g/dL (ref 30.0–36.0)
MCV: 88.7 fl (ref 78.0–100.0)
Monocytes Absolute: 0.4 K/uL (ref 0.1–1.0)
Monocytes Relative: 5.2 % (ref 3.0–12.0)
Neutro Abs: 5.7 K/uL (ref 1.4–7.7)
Neutrophils Relative %: 68.2 % (ref 43.0–77.0)
Platelets: 286 K/uL (ref 150.0–400.0)
RBC: 4.06 Mil/uL (ref 3.87–5.11)
RDW: 15.3 % (ref 11.5–15.5)
WBC: 8.3 K/uL (ref 4.0–10.5)

## 2024-04-17 LAB — HEMOGLOBIN A1C: Hgb A1c MFr Bld: 8.4 % — ABNORMAL HIGH (ref 4.6–6.5)

## 2024-04-17 LAB — LDL CHOLESTEROL, DIRECT: Direct LDL: 64 mg/dL

## 2024-04-17 MED ORDER — LEVOTHYROXINE SODIUM 100 MCG PO TABS: 100.0000 ug | ORAL_TABLET | Freq: Every day | ORAL | 3 refills | Status: AC

## 2024-04-17 MED ORDER — TETANUS-DIPHTH-ACELL PERTUSSIS 5-2-15.5 LF-MCG/0.5 IM SUSP: 0.5000 mL | Freq: Once | INTRAMUSCULAR | 0 refills | Status: AC

## 2024-04-17 MED ORDER — SEMAGLUTIDE (1 MG/DOSE) 4 MG/3ML ~~LOC~~ SOPN: 1.0000 mg | PEN_INJECTOR | SUBCUTANEOUS | 2 refills | Status: DC

## 2024-04-17 NOTE — Progress Notes (Signed)
 Placed in quick sign folder for signature.

## 2024-04-17 NOTE — Patient Instructions (Addendum)
 Your rectum leaks because your anal sphincter has become weak AND you have large internal hemorrhoids that are pushed externally when you defecate (poop). These carry stool outside the body when they get pushed out.   Please talk to Dr Therisa about these issues when you see him next month   Your bladder has dropped, and this is causing your  urinary problems.  I will make a referral to Urogynecology to address this issue. .    Regarding your diabetes:  Your blood sugar report is incomplete.  It did not transmit any evening readings to your phone  Don't turn phone  off anymore because it can;t read or download your blood sugars from the sensor on your arm  Just mute  your phone   at night  Send me message in 2 weeks so I can review your readings  Increase ozempic  dose to 1.0 weekly ( 2 hosts of 0,5 mg at same time ) and I will order the new dose     You are DUE for your tetanus-diptheria-pertussis vaccine   (TDaP)   [facility];  it will be PAID FOR MY MEDICARE ONLY AT [facility]

## 2024-04-17 NOTE — Assessment & Plan Note (Signed)
 Referring to urogynecology for evaluation and management

## 2024-04-17 NOTE — Progress Notes (Signed)
 Patient Assistance Program (PAP) Application   Manufacturer: [employer]  - refill request/dose change form  Medication(s): Ozempic  2.0 mg (dose increase per PCP)  Next Steps: Application filled out and uploaded to PCP eFax folder for review/signature Once signed by PCP, CMA may fax directly to [employer] and upload to chart  Next refill not due until Jan 2026. PCP aware [employer] may not send refill. Will attempt to submit dose change request just in case.

## 2024-04-17 NOTE — Assessment & Plan Note (Signed)
 Advised to incrase ozempic  to 1 mg weekly and leave phone on  so that more data can be captured,   will reviewe in 2 weeks

## 2024-04-17 NOTE — Assessment & Plan Note (Signed)
 Secondary to weakness of anal sphincter and prolapsing internal hemorrhoids  noted on mst recent colonoscopy  She has an appt with GI next month

## 2024-04-17 NOTE — Progress Notes (Signed)
 Subjective:  Patient ID: Maria Hinton, female    DOB: 20-Sep-1945  Age: 78 y.o. MRN: [id number]  CC: The primary encounter diagnosis was HTN (hypertension), benign. Diagnoses of Hyperlipidemia associated with type 2 diabetes mellitus (HCC), Type 2 diabetes mellitus with diabetic neuropathy, with long-term current use of insulin  (HCC), Long-term use of high-risk medication, Dysuria, Cystocele with prolapse, Need for influenza vaccination, Fecal incontinence due to anorectal disorder, and Stage 3b chronic kidney disease (HCC) were also pertinent to this visit.   HPI [name] presents for  Chief Complaint  Patient presents with   Medical Management of Chronic Issues    3 month follow up on diabetes   1) T2DM:  She  feels generally well,  But is not  exercising regularly or trying to lose weight.  Using CBG monitor  Taking   Ozempic  0.5 mg weekly  olmesartan  an atorvastatin  as directed. Not following a carbohydrate modified diet for the past several weeks due to change in living situations..  has neuropathy,  using gabapentin  . Appetite is good.  Has gained 4 lbs since July due to current living arrangements , missed 6 weeks of medication due to ozempic   supplies from office being late.  has resumed ozempic   now has 4 pens left of the 0.5 mg dose.  I have downloaded and reviewed the data from patient's continuous blood glucose monitor  for  the  last two weeks.  Unfortunately the data is incomplete because she has been turning her phone off every night to avoid disturbing her roommate  Patient's  sugars have been  IN RANGE     % OF THE TIME,   ABOVE RANGE    % of the time.  Average blood glucose is     with a variability of       .  Patient's medications currently used for glycemic control are                                                         Patient reports compliance with medication approximately    % of the time.      She has had  burning and heaviness toward the end of the void,  has  to strain to empty bladder but often has residual urine that leaks out when she stands up. History of , 2 vaginal  deliveries   of 9 lbs 4 oz  boys   Rectal leakage for several years,  getting worse,  has internal prolapsed  hemorrhoids  and weak sphincter by last colonoscopy Nov 2024  Has to wear pads and    Outpatient Medications Prior to Visit  Medication Sig Dispense Refill   albuterol  (VENTOLIN  HFA) 108 (90 Base) MCG/ACT inhaler Inhale 2 puffs into the lungs every 6 (six) hours as needed for wheezing. 6.7 g 11   amLODipine -olmesartan  (AZOR ) 5-20 MG tablet Take 1 tablet by mouth daily. 90 tablet 1   aspirin EC 81 MG tablet Take 81 mg by mouth daily.     atenolol  (TENORMIN ) 50 MG tablet TAKE 1 TABLET BY MOUTH TWICE A DAY 180 tablet 1   atorvastatin  (LIPITOR) 40 MG tablet TAKE 1 TABLET BY MOUTH EVERY DAY 90 tablet 1   BD PEN NEEDLE NANO 2ND GEN 32G X 4 MM MISC USE  DAILY AS DIRECTED WITH LEVEMIR  100 each 1   gabapentin  (NEURONTIN ) 100 MG capsule TAKE 1 CAPSULE (100 MG TOTAL) BY MOUTH THREE TIMES DAILY. 270 capsule 1   gabapentin  (NEURONTIN ) 300 MG capsule TAKE 1 CAPSULE BY MOUTH 4 TIMES DAILY. 120 capsule 1   glucose blood (ONETOUCH VERIO) test strip Use as instructed to check blood sugars 3 times daily 100 strip 12   omeprazole  (PRILOSEC) 20 MG capsule TAKE 1 CAPSULE BY MOUTH EVERY DAY 90 capsule 1   OneTouch Delica Lancets 30G MISC 1 application by Does not apply route 2 (two) times daily. Use to check blood sugar up to two times daily. E11.69 100 each 11   traMADol  (ULTRAM ) 50 MG tablet Take 1 tablet (50 mg total) by mouth every 6 (six) hours as needed. Max dose 3 tablets daily 90 tablet 2   vitamin B-12 (CYANOCOBALAMIN ) 500 MCG tablet Take 500 mcg by mouth daily.     levothyroxine  (SYNTHROID ) 100 MCG tablet Take 1 tablet (100 mcg total) by mouth daily. 90 tablet 3   Semaglutide ,0.25 or 0.5MG /DOS, (OZEMPIC , 0.25 OR 0.5 MG/DOSE,) 2 MG/3ML SOPN Inject 0.5 mg into the skin once a week. 3 mL 2    temazepam  (RESTORIL ) 7.5 MG capsule TAKE 1 CAPSULE (7.5 MG TOTAL) BY MOUTH EVERY DAY AT BEDTIME AS NEEDED FOR SLEEP 30 capsule 2   Facility-Administered Medications Prior to Visit  Medication Dose Route Frequency Provider Last Rate Last Admin   ipratropium (ATROVENT ) nebulizer solution 0.5 mg  0.5 mg Nebulization Once Kaur, Charanpreet, NP        Review of Systems;  Patient denies headache, fevers, malaise, unintentional weight loss, skin rash, eye pain, sinus congestion and sinus pain, sore throat, dysphagia,  hemoptysis , cough, dyspnea, wheezing, chest pain, palpitations, orthopnea, edema, abdominal pain, nausea, melena, diarrhea, constipation, flank pain, dysuria, hematuria, urinary  Frequency, nocturia, numbness, tingling, seizures,  Focal weakness, Loss of consciousness,  Tremor, insomnia, depression, anxiety, and suicidal ideation.      Objective:  BP 130/72   Pulse 64   Ht 5' 4 (1.626 m)   Wt 153 lb 12.8 oz (69.8 kg)   SpO2 95%   BMI 26.40 kg/m   BP Readings from Last 3 Encounters:  04/17/24 130/72  01/14/24 136/68  12/14/23 136/66    Wt Readings from Last 3 Encounters:  04/17/24 153 lb 12.8 oz (69.8 kg)  01/14/24 149 lb (67.6 kg)  12/14/23 143 lb 12.8 oz (65.2 kg)    Physical Exam Vitals reviewed.  Constitutional:      General: She is not in acute distress.    Appearance: Normal appearance. She is well-developed and normal weight. She is not ill-appearing, toxic-appearing or diaphoretic.  HENT:     Head: Normocephalic.     Right Ear: Tympanic membrane, ear canal and external ear normal. There is no impacted cerumen.     Left Ear: Tympanic membrane, ear canal and external ear normal. There is no impacted cerumen.     Nose: Nose normal.     Mouth/Throat:     Mouth: Mucous membranes are moist.     Pharynx: Oropharynx is clear.  Eyes:     General: No scleral icterus.       Right eye: No discharge.        Left eye: No discharge.     Conjunctiva/sclera:  Conjunctivae normal.     Pupils: Pupils are equal, round, and reactive to light.  Neck:     Thyroid : No  thyromegaly.     Vascular: No carotid bruit or JVD.  Cardiovascular:     Rate and Rhythm: Normal rate and regular rhythm.     Heart sounds: Normal heart sounds.  Pulmonary:     Effort: Pulmonary effort is normal. No respiratory distress.     Breath sounds: Normal breath sounds.  Chest:  Breasts:    Breasts are symmetrical.  Abdominal:     General: Bowel sounds are normal.     Palpations: Abdomen is soft. There is no mass.     Tenderness: There is no abdominal tenderness. There is no guarding or rebound.     Hernia: There is no hernia in the left inguinal area or right inguinal area.  Genitourinary:    Exam position: Lithotomy position.     Pubic Area: No rash or pubic lice.      Labia:        Right: No rash, tenderness, lesion or injury.        Left: No rash, tenderness, lesion or injury.      Urethra: Prolapse present.     Vagina: Normal.     Cervix: Normal.     Uterus: Normal.      Adnexa: Right adnexa normal and left adnexa normal.     Comments: Xternal hemorrhoids  Musculoskeletal:        General: Normal range of motion.     Cervical back: Normal range of motion and neck supple.  Lymphadenopathy:     Cervical: No cervical adenopathy.     Upper Body:     Right upper body: No supraclavicular, axillary or pectoral adenopathy.     Left upper body: No supraclavicular, axillary or pectoral adenopathy.     Lower Body: No right inguinal adenopathy. No left inguinal adenopathy.  Skin:    General: Skin is warm and dry.  Neurological:     General: No focal deficit present.     Mental Status: She is alert and oriented to person, place, and time. Mental status is at baseline.  Psychiatric:        Mood and Affect: Mood normal.        Behavior: Behavior normal.        Thought Content: Thought content normal.        Judgment: Judgment normal.     Lab Results  Component  Value Date   HGBA1C 8.4 (H) 04/17/2024   HGBA1C 6.4 (A) 12/14/2023   HGBA1C 6.7 (H) 06/15/2023    Lab Results  Component Value Date   CREATININE 1.03 04/17/2024   CREATININE 0.94 12/14/2023   CREATININE 1.01 06/15/2023    Lab Results  Component Value Date   WBC 8.3 04/17/2024   HGB 11.7 (L) 04/17/2024   HCT 36.0 04/17/2024   PLT 286.0 04/17/2024   GLUCOSE 272 (H) 04/17/2024   CHOL 138 04/17/2024   TRIG 122.0 04/17/2024   HDL 55.00 04/17/2024   LDLDIRECT 64.0 04/17/2024   LDLCALC 59 04/17/2024   ALT 8 04/17/2024   AST 12 04/17/2024   NA 139 04/17/2024   K 4.9 04/17/2024   CL 100 04/17/2024   CREATININE 1.03 04/17/2024   BUN 17 04/17/2024   CO2 31 04/17/2024   TSH 2.430 12/14/2023   INR 0.88 02/17/2018   HGBA1C 8.4 (H) 04/17/2024   MICROALBUR 4.0 03/20/2022     Assessment & Plan:  .HTN (hypertension), benign -     Comprehensive metabolic panel with GFR  Hyperlipidemia associated with type 2 diabetes mellitus (  HCC) -     Lipid panel -     LDL cholesterol, direct  Type 2 diabetes mellitus with diabetic neuropathy, with long-term current use of insulin  (HCC) Assessment & Plan: Advised to incrase ozempic  to 1 mg weekly and leave phone on  so that more data can be captured,   will reviewe in 2 weeks   Orders: -     Comprehensive metabolic panel with GFR -     Hemoglobin A1c  Long-term use of high-risk medication -     CBC with Differential/Platelet  Dysuria -     Urine Culture; Future -     Urinalysis, Routine w reflex microscopic; Future  Cystocele with prolapse Assessment & Plan: Referring to urogynecology for evaluation and management   Orders: -     Ambulatory referral to Urogynecology  Need for influenza vaccination -     Flu vaccine HIGH DOSE PF(Fluzone Trivalent)  Fecal incontinence due to anorectal disorder Assessment & Plan: Secondary to weakness of anal sphincter and prolapsing internal hemorrhoids  noted on mst recent colonoscopy  She has  an appt with GI next month    Stage 3b chronic kidney disease (HCC) Assessment & Plan: GFR  is < 50 ml/min but stable.  She has been referred to Nephrology AND IS SEEING DR  Renold SEMI ANNUALLY   Lab Results  Component Value Date   CREATININE 1.03 04/17/2024     Lab Results  Component Value Date   LABMICR 25.6 02/14/2021   MICROALBUR 4.0 03/20/2022     Lab Results  Component Value Date   CREATININE 1.03 04/17/2024      Other orders -     Levothyroxine  Sodium; Take 1 tablet (100 mcg total) by mouth daily.  Dispense: 90 tablet; Refill: 3 -     Tetanus-Diphth-Acell Pertussis; Inject 0.5 mLs into the muscle once for 1 dose.  Dispense: 0.5 mL; Refill: 0 -     Semaglutide  (1 MG/DOSE); Inject 1 mg as directed once a week.  Dispense: 3 mL; Refill: 2    I personally spent a total of 30 minutes in the care of the patient today including preparing to see the patient, getting/reviewing separately obtained history, performing a medically appropriate exam/evaluation, counseling and educating, placing orders, independently interpreting results, and communicating results.  Follow-up: No follow-ups on file.   Verneita LITTIE Kettering, MD

## 2024-04-17 NOTE — Assessment & Plan Note (Signed)
 GFR  is < 50 ml/min but stable.  She has been referred to Nephrology AND IS SEEING DR  Renold SEMI ANNUALLY   Lab Results  Component Value Date   CREATININE 1.03 04/17/2024     Lab Results  Component Value Date   LABMICR 25.6 02/14/2021   MICROALBUR 4.0 03/20/2022     Lab Results  Component Value Date   CREATININE 1.03 04/17/2024

## 2024-04-18 LAB — URINALYSIS, ROUTINE W REFLEX MICROSCOPIC
Bilirubin Urine: NEGATIVE
Ketones, ur: NEGATIVE
Nitrite: NEGATIVE
Specific Gravity, Urine: 1.01 (ref 1.000–1.030)
Total Protein, Urine: NEGATIVE
Urine Glucose: NEGATIVE
Urobilinogen, UA: 0.2 (ref 0.0–1.0)
pH: 7 (ref 5.0–8.0)

## 2024-04-19 ENCOUNTER — Ambulatory Visit: Payer: Self-pay | Admitting: Internal Medicine

## 2024-04-21 LAB — URINE CULTURE

## 2024-04-24 ENCOUNTER — Ambulatory Visit
Admission: RE | Admit: 2024-04-24 | Discharge: 2024-04-24 | Disposition: A | Discharge status: Home / Self Care | Source: Ambulatory Visit | Attending: Internal Medicine | Admitting: Internal Medicine

## 2024-04-24 DIAGNOSIS — Z1231 Encounter for screening mammogram for malignant neoplasm of breast: Secondary | ICD-10-CM | POA: Insufficient documentation

## 2024-05-01 ENCOUNTER — Telehealth: Payer: Self-pay | Admitting: *Deleted

## 2024-05-01 NOTE — Telephone Encounter (Signed)
 Patient notified via voicemail that Patient Assistance Medication are in the office & are ready for pick up.   Medication: Ozempic  2mg   Quantity: 3 boxes  Lot# [id number]  Exp: 11/03/2026

## 2024-05-04 ENCOUNTER — Telehealth: Payer: Self-pay

## 2024-05-04 NOTE — Telephone Encounter (Signed)
Patient picked up medication today.

## 2024-05-04 NOTE — Telephone Encounter (Signed)
 Pt came in to pickup pt assistance medication Ozempic  3 boxes @ 10:20 am

## 2024-05-05 ENCOUNTER — Encounter: Payer: Self-pay | Admitting: Internal Medicine

## 2024-05-05 DIAGNOSIS — E1140 Type 2 diabetes mellitus with diabetic neuropathy, unspecified: Secondary | ICD-10-CM

## 2024-05-05 NOTE — Telephone Encounter (Signed)
Printed and placed in results folder.  

## 2024-05-07 NOTE — Assessment & Plan Note (Signed)
 Review of BS from CBG for the laterr half of October note variability,  with only 65% of readings in target range,  and average glucodse of 174.  Rec adding jardiance given her nephropathy or increasing ozempic  dose

## 2024-05-10 MED ORDER — SEMAGLUTIDE (2 MG/DOSE) 8 MG/3ML ~~LOC~~ SOPN: 2.0000 mg | PEN_INJECTOR | SUBCUTANEOUS | 3 refills | Status: DC

## 2024-05-10 NOTE — Addendum Note (Signed)
 Addended by: MARYLYNN VERNEITA CROME on: 05/10/2024 01:05 PM   Modules accepted: Orders

## 2024-05-16 ENCOUNTER — Telehealth: Payer: Self-pay

## 2024-05-16 NOTE — Telephone Encounter (Signed)
 Completed refill form for Ozempic  and faxed to provider for review and signature.

## 2024-05-18 ENCOUNTER — Other Ambulatory Visit (HOSPITAL_COMMUNITY): Payer: Self-pay

## 2024-05-30 ENCOUNTER — Other Ambulatory Visit: Payer: Self-pay | Admitting: Internal Medicine

## 2024-05-30 NOTE — Telephone Encounter (Signed)
 Last office visit 04/17/2024 for medical management of chronic issues.  Last refilled 01/18/2024 for #90 with 2 refills.  Next Appt: 06/06/24 for follow up.

## 2024-06-06 ENCOUNTER — Ambulatory Visit: Admitting: Internal Medicine

## 2024-06-06 ENCOUNTER — Encounter: Payer: Self-pay | Admitting: Internal Medicine

## 2024-06-06 VITALS — BP 138/80 | HR 93 | Ht 64.0 in | Wt 147.8 lb

## 2024-06-06 DIAGNOSIS — E1140 Type 2 diabetes mellitus with diabetic neuropathy, unspecified: Secondary | ICD-10-CM | POA: Diagnosis not present

## 2024-06-06 DIAGNOSIS — Z780 Asymptomatic menopausal state: Secondary | ICD-10-CM

## 2024-06-06 DIAGNOSIS — Z794 Long term (current) use of insulin: Secondary | ICD-10-CM

## 2024-06-06 MED ORDER — AMLODIPINE-OLMESARTAN 5-20 MG PO TABS: 1.0000 | ORAL_TABLET | Freq: Every day | ORAL | 1 refills | Status: AC

## 2024-06-06 NOTE — Patient Instructions (Addendum)
  Your  DEXA scan (bone density)  been ordered.   This is done at [facility].  You are encouraged (required) to call to schedule your own  appointment at [facility]  , and their phone number is [phone]   Send me 5 BP readings in a month and I will review them for medication modification    Please resume tylenol  acetominophen ( you can take up to 3000 mg daily in divided  doses)  ;  take 1000 mg at bedtime with one tramadol  and your gabapetin    You can increase the gabapentin  to 400 mg 4 times daily if needed.

## 2024-06-06 NOTE — Progress Notes (Unsigned)
 Subjective:  Patient ID: Maria Hinton, female    DOB: Mar 02, 1946  Age: 78 y.o. MRN: [id number]  CC: There were no encounter diagnoses.   HPI Maria Hinton presents for  Chief Complaint  Patient presents with   Medical Management of Chronic Issues    1) T2DM:   T2DM:  She  feels generally well,  But is not  exercising regularly or trying to lose weight. Checking  blood sugars less than once daily at variable times, usually only if she feels she may be having a hypoglycemic event. .  BS have been under 130 fasting and < 150 post prandially.  Denies any recent hypoglyemic events.  Taking   medications as directed. Following a carbohydrate modified diet 6 days per week. Denies numbness, burning and tingling of extremities. Appetite is good.     2) abnormal LFTS referred to Doris Kung ,  seen in mid November  follow up advised in 4-6 weeks  3) INCOMPLETE BLADDER EMPTYING, CYSTOCELE   Outpatient Medications Prior to Visit  Medication Sig Dispense Refill   albuterol  (VENTOLIN  HFA) 108 (90 Base) MCG/ACT inhaler Inhale 2 puffs into the lungs every 6 (six) hours as needed for wheezing. 6.7 g 11   amLODipine -olmesartan  (AZOR ) 5-20 MG tablet Take 1 tablet by mouth daily. 90 tablet 1   aspirin EC 81 MG tablet Take 81 mg by mouth daily.     atenolol  (TENORMIN ) 50 MG tablet TAKE 1 TABLET BY MOUTH TWICE A DAY 180 tablet 1   atorvastatin  (LIPITOR) 40 MG tablet TAKE 1 TABLET BY MOUTH EVERY DAY 90 tablet 1   BD PEN NEEDLE NANO 2ND GEN 32G X 4 MM MISC USE DAILY AS DIRECTED WITH LEVEMIR  100 each 1   gabapentin  (NEURONTIN ) 100 MG capsule TAKE 1 CAPSULE (100 MG TOTAL) BY MOUTH THREE TIMES DAILY. 270 capsule 1   gabapentin  (NEURONTIN ) 300 MG capsule TAKE 1 CAPSULE BY MOUTH 4 TIMES DAILY. 120 capsule 1   glucose blood (ONETOUCH VERIO) test strip Use as instructed to check blood sugars 3 times daily 100 strip 12   levothyroxine  (SYNTHROID ) 100 MCG tablet Take 1 tablet (100 mcg total) by mouth daily. 90  tablet 3   omeprazole  (PRILOSEC) 20 MG capsule TAKE 1 CAPSULE BY MOUTH EVERY DAY 90 capsule 1   OneTouch Delica Lancets 30G MISC 1 application by Does not apply route 2 (two) times daily. Use to check blood sugar up to two times daily. E11.69 100 each 11   Semaglutide , 2 MG/DOSE, 8 MG/3ML SOPN Inject 2 mg as directed once a week. 3 mL 3   traMADol  (ULTRAM ) 50 MG tablet TAKE 1 TABLET (50 MG TOTAL) BY MOUTH EVERY 6 (SIX) HOURS AS NEEDED. MAX DOSE 3 TABLETS DAILY 90 tablet 2   vitamin B-12 (CYANOCOBALAMIN ) 500 MCG tablet Take 500 mcg by mouth daily.     Facility-Administered Medications Prior to Visit  Medication Dose Route Frequency Provider Last Rate Last Admin   ipratropium (ATROVENT ) nebulizer solution 0.5 mg  0.5 mg Nebulization Once Kaur, Charanpreet, NP        Review of Systems;  Patient denies headache, fevers, malaise, unintentional weight loss, skin rash, eye pain, sinus congestion and sinus pain, sore throat, dysphagia,  hemoptysis , cough, dyspnea, wheezing, chest pain, palpitations, orthopnea, edema, abdominal pain, nausea, melena, diarrhea, constipation, flank pain, dysuria, hematuria, urinary  Frequency, nocturia, numbness, tingling, seizures,  Focal weakness, Loss of consciousness,  Tremor, insomnia, depression, anxiety, and suicidal ideation.  Objective:  BP 138/80   Pulse 93   Ht 5' 4 (1.626 m)   Wt 147 lb 12.8 oz (67 kg)   SpO2 97%   BMI 25.37 kg/m   BP Readings from Last 3 Encounters:  06/06/24 138/80  04/17/24 130/72  01/14/24 136/68    Wt Readings from Last 3 Encounters:  06/06/24 147 lb 12.8 oz (67 kg)  04/17/24 153 lb 12.8 oz (69.8 kg)  01/14/24 149 lb (67.6 kg)    Physical Exam  Lab Results  Component Value Date   HGBA1C 8.4 (H) 04/17/2024   HGBA1C 6.4 (A) 12/14/2023   HGBA1C 6.7 (H) 06/15/2023    Lab Results  Component Value Date   CREATININE 1.03 04/17/2024   CREATININE 0.94 12/14/2023   CREATININE 1.01 06/15/2023    Lab Results   Component Value Date   WBC 8.3 04/17/2024   HGB 11.7 (L) 04/17/2024   HCT 36.0 04/17/2024   PLT 286.0 04/17/2024   GLUCOSE 272 (H) 04/17/2024   CHOL 138 04/17/2024   TRIG 122.0 04/17/2024   HDL 55.00 04/17/2024   LDLDIRECT 64.0 04/17/2024   LDLCALC 59 04/17/2024   ALT 8 04/17/2024   AST 12 04/17/2024   NA 139 04/17/2024   K 4.9 04/17/2024   CL 100 04/17/2024   CREATININE 1.03 04/17/2024   BUN 17 04/17/2024   CO2 31 04/17/2024   TSH 2.430 12/14/2023   INR 0.88 02/17/2018   HGBA1C 8.4 (H) 04/17/2024   MICROALBUR 4.0 03/20/2022    MM 3D SCREENING MAMMOGRAM BILATERAL BREAST Result Date: 04/27/2024 CLINICAL DATA:  Screening. EXAM: DIGITAL SCREENING BILATERAL MAMMOGRAM WITH TOMOSYNTHESIS AND CAD TECHNIQUE: Bilateral screening digital craniocaudal and mediolateral oblique mammograms were obtained. Bilateral screening digital breast tomosynthesis was performed. The images were evaluated with computer-aided detection. COMPARISON:  Previous exam(s). ACR Breast Density Category b: There are scattered areas of fibroglandular density. FINDINGS: There are no findings suspicious for malignancy. IMPRESSION: No mammographic evidence of malignancy. A result letter of this screening mammogram will be mailed directly to the patient. RECOMMENDATION: Screening mammogram in one year. (Code:SM-B-01Y) BI-RADS CATEGORY  1: Negative. Electronically Signed   By: Curtistine Noble   On: 04/27/2024 09:00    Assessment & Plan:  .There are no diagnoses linked to this encounter.   I spent 34 minutes on the day of this face to face encounter reviewing patient's  most recent visit with cardiology,  nephrology,  and neurology,  prior relevant surgical and non surgical procedures, recent  labs and imaging studies, counseling on weight management,  reviewing the assessment and plan with patient, and post visit ordering and reviewing of  diagnostics and therapeutics with patient  .   Follow-up: No follow-ups on  file.   Verneita LITTIE Kettering, MD

## 2024-06-07 ENCOUNTER — Encounter: Payer: Self-pay | Admitting: Internal Medicine

## 2024-06-07 NOTE — Assessment & Plan Note (Addendum)
 Significant improvement since October based on review of data from current CBG I have downloaded and reviewed the data from patient's continuous blood glucose monitor  for  the period  of  Nov 14 to Nov 27      .  Patient's  sugars have been  IN RANGE  91   % OF THE TIME,   ABOVE RANGE   9 % of the time.  Average blood glucose is  139 with a variability of   21%    .no change to current regimen advised   . Using gabapentin  at submaximal doses.  With persistent symptoms at night interfering with sleep.  Advised how to increase gabapentin    Lab Results  Component Value Date   HGBA1C 8.4 (H) 04/17/2024

## 2024-06-08 ENCOUNTER — Other Ambulatory Visit: Payer: Self-pay | Admitting: Internal Medicine

## 2024-06-08 ENCOUNTER — Inpatient Hospital Stay: Admission: RE | Admit: 2024-06-08 | Discharge: 2024-06-08 | Attending: Internal Medicine | Admitting: Internal Medicine

## 2024-06-08 DIAGNOSIS — Z780 Asymptomatic menopausal state: Secondary | ICD-10-CM | POA: Diagnosis present

## 2024-06-10 ENCOUNTER — Ambulatory Visit: Payer: Self-pay | Admitting: Internal Medicine

## 2024-06-13 ENCOUNTER — Telehealth: Payer: Self-pay

## 2024-06-13 NOTE — Telephone Encounter (Signed)
 Spoke with pt to let her know that we have received her patient assistance medication in the office and it is ready for pick up.    Ozempic  2 mg: 2 boxes

## 2024-06-14 ENCOUNTER — Other Ambulatory Visit: Payer: Self-pay | Admitting: Internal Medicine

## 2024-06-14 NOTE — Telephone Encounter (Signed)
Pt has picked up medication.  

## 2024-07-12 ENCOUNTER — Encounter: Payer: Self-pay | Admitting: Internal Medicine

## 2024-07-12 MED ORDER — SEMAGLUTIDE (2 MG/DOSE) 8 MG/3ML ~~LOC~~ SOPN: 2.0000 mg | PEN_INJECTOR | SUBCUTANEOUS | 3 refills | Status: AC

## 2024-07-12 NOTE — Telephone Encounter (Signed)
 Dr. Tullo is it okay to send a rx for Ozempic  to pt's pharmacy to see if it will be affordable for her?

## 2024-07-12 NOTE — Telephone Encounter (Signed)
 Spoke with [facility] and they stated that her copay is $30.00. I called to let the pt know that she will no longer be getting the Ozempic  through [facility] that she will have to start getting it filled through her local pharmacy. Pt is aware that it will cost $30.00 and is okay with that price. Hoever while talking to the pt it was dscovered that she has been taking too much Ozempic . She stated that she has been taking 2 injections of the 0.5 mg and 1 injection of the 2 mg. Pt was not aware that she needed to stop the 0.5 mg when she was increased to the 2 mg.

## 2024-07-12 NOTE — Telephone Encounter (Signed)
 Pt was getting her Ozempic  through patient assistance. Will she continue to get this medication or will she need to be changed to a different medication.

## 2024-07-17 NOTE — Telephone Encounter (Signed)
 Pt has seen and read mychart message.

## 2024-07-18 ENCOUNTER — Ambulatory Visit: Admitting: Internal Medicine

## 2024-07-19 ENCOUNTER — Encounter: Payer: Self-pay | Admitting: Internal Medicine

## 2024-07-19 ENCOUNTER — Ambulatory Visit: Admitting: Internal Medicine

## 2024-07-19 VITALS — BP 130/72 | HR 96 | Temp 98.7°F | Ht 64.0 in | Wt 151.2 lb

## 2024-07-19 DIAGNOSIS — Z794 Long term (current) use of insulin: Secondary | ICD-10-CM

## 2024-07-19 DIAGNOSIS — N814 Uterovaginal prolapse, unspecified: Secondary | ICD-10-CM | POA: Diagnosis not present

## 2024-07-19 DIAGNOSIS — M25552 Pain in left hip: Secondary | ICD-10-CM

## 2024-07-19 DIAGNOSIS — G8929 Other chronic pain: Secondary | ICD-10-CM | POA: Diagnosis not present

## 2024-07-19 DIAGNOSIS — N1832 Chronic kidney disease, stage 3b: Secondary | ICD-10-CM

## 2024-07-19 DIAGNOSIS — E785 Hyperlipidemia, unspecified: Secondary | ICD-10-CM | POA: Diagnosis not present

## 2024-07-19 DIAGNOSIS — E1140 Type 2 diabetes mellitus with diabetic neuropathy, unspecified: Secondary | ICD-10-CM

## 2024-07-19 DIAGNOSIS — E1169 Type 2 diabetes mellitus with other specified complication: Secondary | ICD-10-CM

## 2024-07-19 DIAGNOSIS — N39492 Postural (urinary) incontinence: Secondary | ICD-10-CM | POA: Diagnosis not present

## 2024-07-19 DIAGNOSIS — Z7985 Long-term (current) use of injectable non-insulin antidiabetic drugs: Secondary | ICD-10-CM

## 2024-07-19 DIAGNOSIS — M47819 Spondylosis without myelopathy or radiculopathy, site unspecified: Secondary | ICD-10-CM | POA: Diagnosis not present

## 2024-07-19 DIAGNOSIS — I10 Essential (primary) hypertension: Secondary | ICD-10-CM | POA: Diagnosis not present

## 2024-07-19 MED ORDER — ATENOLOL 50 MG PO TABS: 50.0000 mg | ORAL_TABLET | Freq: Two times a day (BID) | ORAL | 1 refills | Status: AC

## 2024-07-19 MED ORDER — OMEPRAZOLE 20 MG PO CPDR: DELAYED_RELEASE_CAPSULE | ORAL | 1 refills | Status: AC

## 2024-07-19 MED ORDER — GABAPENTIN 100 MG PO CAPS: ORAL_CAPSULE | ORAL | 1 refills | Status: AC

## 2024-07-19 MED ORDER — ATORVASTATIN CALCIUM 40 MG PO TABS: 40.0000 mg | ORAL_TABLET | Freq: Every day | ORAL | 1 refills | Status: AC

## 2024-07-19 NOTE — Assessment & Plan Note (Signed)
 Referring to urogynecology for evaluation and management

## 2024-07-19 NOTE — Patient Instructions (Addendum)
 Referral to Dr marchia and to [facility] in progress.   . They will attempt to contact you 3 times and if you do not return their call they will quit trying SO PLEASE CHECK YOUR MESSAGES

## 2024-07-19 NOTE — Progress Notes (Signed)
 "  Subjective:  Patient ID: Maria Hinton, female    DOB: Oct 19, 1945  Age: 79 y.o. MRN: [id number]  CC: The primary encounter diagnosis was Chronic left hip pain. Diagnoses of Type 2 diabetes mellitus with diabetic neuropathy, with long-term current use of insulin  (HCC), HTN (hypertension), benign, Hyperlipidemia associated with type 2 diabetes mellitus (HCC), Cystocele with prolapse, Postural urinary incontinence, Stage 3b chronic kidney disease (HCC), Acute hip pain, left, Left hip pain, and Osteoarthritis of spinal facet joint were also pertinent to this visit.   HPI Maria Hinton presents for  Chief Complaint  Patient presents with   Diabetes   6 week follow up on type 2 DM>  I have downloaded and reviewed the data from patient's continuous blood glucose monitor  for  the period  of  dec 12 to dec 25      .  Patient's  sugars have been  IN RANGE 95    % OF THE TIME,   ABOVE RANGE  5  % of the time.  Average blood glucose is  135   with a variability of     19%  .    Patient's medications currently used for glycemic control are    semiglutide 2 mg weekly .  patient reports compliance with medication approximately 100   % of the time.     2) moderate to severe left  hip pain for the past month . She has a history of left hip replacement (2019,  by [facility]) and  has chronic pain managed with tramadol  taken 3 times daily,. The pain in the left hip is brought on by getting up to walk . She can no longer cross her left leg over night and has to pull her leg into the driver's seat.  The pain is aggravated by walking and caring for a friend who was recently hospitalized but is not doing any lifting or pushing . Taking tramadol  and tylenol  every 8 hours. Taking 400 mg gabapentin  tid as well     Outpatient Medications Prior to Visit  Medication Sig Dispense Refill   albuterol  (VENTOLIN  HFA) 108 (90 Base) MCG/ACT inhaler Inhale 2 puffs into the lungs every 6 (six) hours as needed for wheezing. 6.7  g 11   amLODipine -olmesartan  (AZOR ) 5-20 MG tablet Take 1 tablet by mouth daily. 90 tablet 1   aspirin EC 81 MG tablet Take 81 mg by mouth daily.     BD PEN NEEDLE NANO 2ND GEN 32G X 4 MM MISC USE DAILY AS DIRECTED WITH LEVEMIR  100 each 1   gabapentin  (NEURONTIN ) 300 MG capsule TAKE 1 CAPSULE BY MOUTH 4 TIMES DAILY. 120 capsule 1   glucose blood (ONETOUCH VERIO) test strip Use as instructed to check blood sugars 3 times daily 100 strip 12   levothyroxine  (SYNTHROID ) 100 MCG tablet Take 1 tablet (100 mcg total) by mouth daily. 90 tablet 3   OneTouch Delica Lancets 30G MISC 1 application by Does not apply route 2 (two) times daily. Use to check blood sugar up to two times daily. E11.69 100 each 11   Semaglutide , 2 MG/DOSE, 8 MG/3ML SOPN Inject 2 mg as directed once a week. 3 mL 3   traMADol  (ULTRAM ) 50 MG tablet TAKE 1 TABLET (50 MG TOTAL) BY MOUTH EVERY 6 (SIX) HOURS AS NEEDED. MAX DOSE 3 TABLETS DAILY 90 tablet 2   vitamin B-12 (CYANOCOBALAMIN ) 500 MCG tablet Take 500 mcg by mouth daily.     atenolol  (TENORMIN )  50 MG tablet TAKE 1 TABLET BY MOUTH TWICE A DAY 180 tablet 1   atorvastatin  (LIPITOR) 40 MG tablet TAKE 1 TABLET BY MOUTH EVERY DAY 90 tablet 1   gabapentin  (NEURONTIN ) 100 MG capsule TAKE 1 CAPSULE (100 MG TOTAL) BY MOUTH THREE TIMES DAILY. 270 capsule 1   omeprazole  (PRILOSEC) 20 MG capsule TAKE 1 CAPSULE BY MOUTH EVERY DAY 90 capsule 1   Facility-Administered Medications Prior to Visit  Medication Dose Route Frequency Provider Last Rate Last Admin   ipratropium (ATROVENT ) nebulizer solution 0.5 mg  0.5 mg Nebulization Once Kaur, Charanpreet, NP        Review of Systems;  Patient denies headache, fevers, malaise, unintentional weight loss, skin rash, eye pain, sinus congestion and sinus pain, sore throat, dysphagia,  hemoptysis , cough, dyspnea, wheezing, chest pain, palpitations, orthopnea, edema, abdominal pain, nausea, melena, diarrhea, constipation, flank pain, dysuria, hematuria,  urinary  Frequency, nocturia, numbness, tingling, seizures,  Focal weakness, Loss of consciousness,  Tremor, insomnia, depression, anxiety, and suicidal ideation.      Objective:  BP 130/72   Pulse 96   Temp 98.7 F (37.1 C) (Oral)   Ht 5' 4 (1.626 m)   Wt 151 lb 3.2 oz (68.6 kg)   SpO2 97%   BMI 25.95 kg/m   BP Readings from Last 3 Encounters:  07/19/24 130/72  06/06/24 138/80  04/17/24 130/72    Wt Readings from Last 3 Encounters:  07/19/24 151 lb 3.2 oz (68.6 kg)  06/06/24 147 lb 12.8 oz (67 kg)  04/17/24 153 lb 12.8 oz (69.8 kg)    Physical Exam Vitals reviewed.  Constitutional:      General: She is not in acute distress.    Appearance: Normal appearance. She is normal weight. She is not ill-appearing, toxic-appearing or diaphoretic.  HENT:     Head: Normocephalic.  Eyes:     General: No scleral icterus.       Right eye: No discharge.        Left eye: No discharge.     Conjunctiva/sclera: Conjunctivae normal.  Cardiovascular:     Rate and Rhythm: Normal rate and regular rhythm.     Heart sounds: Normal heart sounds.  Pulmonary:     Effort: Pulmonary effort is normal. No respiratory distress.     Breath sounds: Normal breath sounds.  Musculoskeletal:        General: Normal range of motion.  Skin:    General: Skin is warm and dry.  Neurological:     General: No focal deficit present.     Mental Status: She is alert and oriented to person, place, and time. Mental status is at baseline.  Psychiatric:        Mood and Affect: Mood normal.        Behavior: Behavior normal.        Thought Content: Thought content normal.        Judgment: Judgment normal.     Lab Results  Component Value Date   HGBA1C 7.1 (H) 07/19/2024   HGBA1C 8.4 (H) 04/17/2024   HGBA1C 6.4 (A) 12/14/2023    Lab Results  Component Value Date   CREATININE 1.14 07/19/2024   CREATININE 1.03 04/17/2024   CREATININE 0.94 12/14/2023    Lab Results  Component Value Date   WBC 8.3  04/17/2024   HGB 11.7 (L) 04/17/2024   HCT 36.0 04/17/2024   PLT 286.0 04/17/2024   GLUCOSE 80 07/19/2024   CHOL 137 07/19/2024  TRIG 199.0 (H) 07/19/2024   HDL 43.60 07/19/2024   LDLDIRECT 59.0 07/19/2024   LDLCALC 53 07/19/2024   ALT 9 07/19/2024   AST 17 07/19/2024   NA 138 07/19/2024   K 4.6 07/19/2024   CL 102 07/19/2024   CREATININE 1.14 07/19/2024   BUN 15 07/19/2024   CO2 29 07/19/2024   TSH 2.430 12/14/2023   INR 0.88 02/17/2018   HGBA1C 7.1 (H) 07/19/2024   MICROALBUR 4.0 03/20/2022    DG Bone Density Result Date: 06/08/2024 EXAM: DUAL X-RAY ABSORPTIOMETRY (DXA) FOR BONE MINERAL DENSITY 06/08/2024 2:04 pm CLINICAL DATA:  79 year old Female Postmenopausal. Elevated alkaline phosphate, postmenopausal estrogen deficiency TECHNIQUE: An axial (e.g., hips, spine) and/or appendicular (e.g., radius) exam was performed, as appropriate, using GE Secretary/administrator at [facility]. Images are obtained for bone mineral density measurement and are not obtained for diagnostic purposes. [id number] Exclusions: Lumbar spine due to degenerative changes; left hip due to surgical hardware. COMPARISON:  02/22/2014. FINDINGS: Scan quality: Good. RIGHT FEMORAL NECK: BMD (in g/cm2): 0.956 T-score: -0.6 Z-score: 1.4 RIGHT TOTAL HIP: BMD (in g/cm2): 0.839 T-score: -1.3 Z-score: 0.5 Rate of change from previous exam: -13.1 % LEFT FOREARM (RADIUS 33%): BMD (in g/cm2): 0.888 T-score: 0.1 Z-score: 2.5 Rate of change from previous exam: -10.8 % FRAX 10-YEAR PROBABILITY OF FRACTURE: 10-year fracture risk is performed using the [facility] FRAX calculator based on patient-reported risk factors. Major osteoporotic fracture: 9.3% Hip fracture: 1.2% Other situations known to alter the reliability of the FRAX score should be considered when making treatment decisions, including chronic glucocorticoid use and past treatments. Further guidance on treatment can be found at the [facility]  Osteoporosis Foundation's website [email or web address]://www.[email or web address].com/. IMPRESSION: Osteopenia based on BMD. Fracture risk is increased. Increased risk is based on low BMD. RECOMMENDATIONS: 1. All patients should optimize calcium  and vitamin D intake. 2. Consider FDA-approved medical therapies in postmenopausal women and men aged 60 years and older, based on the following: - A hip or vertebral (clinical or morphometric) fracture - T-score less than or equal to -2.5 and secondary causes have been excluded. - Low bone mass (T-score between -1.0 and -2.5) and a 10-year probability of a hip fracture greater than or equal to 3% or a 10-year probability of a major osteoporosis-related fracture greater than or equal to 20% based on the [place] -adapted WHO algorithm. - Clinician judgment and/or patient preferences may indicate treatment for people with 10-year fracture probabilities above or below these levels 3. Patients with diagnosis of osteoporosis or at high risk for fracture should have regular bone mineral density tests. For patients eligible for Medicare, routine testing is allowed once every 2 years. The testing frequency can be increased to one year for patients who have rapidly progressing disease, those who are receiving or discontinuing medical therapy to restore bone mass, or have additional risk factors. Electronically Signed   By: Dina  Arceo M.D.   On: 06/08/2024 14:18    Assessment & Plan:  .Chronic left hip pain -     Ambulatory referral to Orthopedic Surgery  Type 2 diabetes mellitus with diabetic neuropathy, with long-term current use of insulin  (HCC) Assessment & Plan: Currently well-controlled on semiglutide 2 mg weekly no changes today  Orders: -     Hemoglobin A1c -     Comprehensive metabolic panel with GFR  HTN (hypertension), benign -     Comprehensive metabolic panel with GFR  Hyperlipidemia associated with type 2 diabetes mellitus (HCC) -  LDL cholesterol, direct -     Lipid panel  Cystocele with  prolapse Assessment & Plan: Referring to urogynecology for evaluation and management   Orders: -     Ambulatory referral to Urogynecology  Postural urinary incontinence -     Ambulatory referral to Urogynecology  Stage 3b chronic kidney disease (HCC) Assessment & Plan: GFR  is < 50 ml/min but stable.  She has been referred to Nephrology AND IS SEEING DR  Renold SEMI ANNUALLY   Lab Results  Component Value Date   CREATININE 1.14 07/19/2024     Lab Results  Component Value Date   LABMICR 25.6 02/14/2021   MICROALBUR 4.0 03/20/2022     Lab Results  Component Value Date   CREATININE 1.14 07/19/2024      Acute hip pain, left Assessment & Plan: S/p  hip replacemet. She has  mild degenerative changes of the right hip with sclerosis and osteophyte formation   by last film in 2023 .  Referring to Dr Quin similar to the prior study.    Left hip pain Assessment & Plan: Referring to Krasinski for evaluation given prior left hip replacement  .    Osteoarthritis of spinal facet joint Assessment & Plan: Her pain is managd with tramadol  and tylenol  given CKD.  Maria Hinton  Controlled substance contract signed.    Other orders -     Atenolol ; Take 1 tablet (50 mg total) by mouth 2 (two) times daily.  Dispense: 180 tablet; Refill: 1 -     Atorvastatin  Calcium ; Take 1 tablet (40 mg total) by mouth daily.  Dispense: 90 tablet; Refill: 1 -     Gabapentin ; TAKE 1 CAPSULE (100 MG TOTAL) BY MOUTH THREE TIMES DAILY.  Dispense: 270 capsule; Refill: 1 -     Omeprazole ; TAKE 1 CAPSULE BY MOUTH EVERY DAY  Dispense: 90 capsule; Refill: 1    Follow-up: No follow-ups on file.   Maria Hinton [place], MD "

## 2024-07-19 NOTE — Assessment & Plan Note (Signed)
 Currently well-controlled on semiglutide 2 mg weekly no changes today

## 2024-07-20 ENCOUNTER — Encounter: Payer: Self-pay | Admitting: Internal Medicine

## 2024-07-20 LAB — COMPREHENSIVE METABOLIC PANEL WITH GFR
ALT: 9 U/L (ref 3–35)
AST: 17 U/L (ref 5–37)
Albumin: 4.5 g/dL (ref 3.5–5.2)
Alkaline Phosphatase: 133 U/L — ABNORMAL HIGH (ref 39–117)
BUN: 15 mg/dL (ref 6–23)
CO2: 29 meq/L (ref 19–32)
Calcium: 9 mg/dL (ref 8.4–10.5)
Chloride: 102 meq/L (ref 96–112)
Creatinine, Ser: 1.14 mg/dL (ref 0.40–1.20)
GFR: 46.79 mL/min — ABNORMAL LOW
Glucose, Bld: 80 mg/dL (ref 70–99)
Potassium: 4.6 meq/L (ref 3.5–5.1)
Sodium: 138 meq/L (ref 135–145)
Total Bilirubin: 0.5 mg/dL (ref 0.2–1.2)
Total Protein: 7.3 g/dL (ref 6.0–8.3)

## 2024-07-20 LAB — LIPID PANEL
Cholesterol: 137 mg/dL (ref 28–200)
HDL: 43.6 mg/dL
LDL Cholesterol: 53 mg/dL (ref 10–99)
NonHDL: 92.95
Total CHOL/HDL Ratio: 3
Triglycerides: 199 mg/dL — ABNORMAL HIGH (ref 10.0–149.0)
VLDL: 39.8 mg/dL (ref 0.0–40.0)

## 2024-07-20 LAB — LDL CHOLESTEROL, DIRECT: Direct LDL: 59 mg/dL

## 2024-07-20 LAB — HEMOGLOBIN A1C: Hgb A1c MFr Bld: 7.1 % — ABNORMAL HIGH (ref 4.6–6.5)

## 2024-07-20 NOTE — Assessment & Plan Note (Signed)
 Her pain is managd with tramadol  and tylenol  given CKD.  Maria Hinton  Controlled substance contract signed.

## 2024-07-20 NOTE — Assessment & Plan Note (Addendum)
 S/p  hip replacemet. She has  mild degenerative changes of the right hip with sclerosis and osteophyte formation   by last film in 2023 .  Referring to Dr Quin similar to the prior study.

## 2024-07-20 NOTE — Assessment & Plan Note (Signed)
 Referring to Krasinski for evaluation given prior left hip replacement  .

## 2024-07-20 NOTE — Assessment & Plan Note (Signed)
 GFR  is < 50 ml/min but stable.  She has been referred to Nephrology AND IS SEEING DR  Renold SEMI ANNUALLY   Lab Results  Component Value Date   CREATININE 1.14 07/19/2024     Lab Results  Component Value Date   LABMICR 25.6 02/14/2021   MICROALBUR 4.0 03/20/2022     Lab Results  Component Value Date   CREATININE 1.14 07/19/2024

## 2024-07-22 ENCOUNTER — Ambulatory Visit: Payer: Self-pay | Admitting: Internal Medicine

## 2024-09-20 ENCOUNTER — Ambulatory Visit

## 2024-10-18 ENCOUNTER — Ambulatory Visit: Admitting: Internal Medicine

## 2024-10-20 ENCOUNTER — Ambulatory Visit: Admitting: Obstetrics and Gynecology
